# Patient Record
Sex: Male | Born: 1940
Health system: Southern US, Community
[De-identification: ages and names within clinical notes are randomized; demographics above are authoritative.]

## PROBLEM LIST (undated history)

## (undated) DIAGNOSIS — J449 Chronic obstructive pulmonary disease, unspecified: Secondary | ICD-10-CM

## (undated) DIAGNOSIS — I340 Nonrheumatic mitral (valve) insufficiency: Secondary | ICD-10-CM

## (undated) DIAGNOSIS — K8020 Calculus of gallbladder without cholecystitis without obstruction: Secondary | ICD-10-CM

## (undated) DIAGNOSIS — D649 Anemia, unspecified: Secondary | ICD-10-CM

## (undated) DIAGNOSIS — Z9981 Dependence on supplemental oxygen: Secondary | ICD-10-CM

## (undated) DIAGNOSIS — M1990 Unspecified osteoarthritis, unspecified site: Principal | ICD-10-CM

## (undated) DIAGNOSIS — R918 Other nonspecific abnormal finding of lung field: Secondary | ICD-10-CM

## (undated) DIAGNOSIS — Z87891 Personal history of nicotine dependence: Secondary | ICD-10-CM

## (undated) DIAGNOSIS — J189 Pneumonia, unspecified organism: Secondary | ICD-10-CM

## (undated) DIAGNOSIS — E871 Hypo-osmolality and hyponatremia: Secondary | ICD-10-CM

## (undated) DIAGNOSIS — E785 Hyperlipidemia, unspecified: Secondary | ICD-10-CM

## (undated) DIAGNOSIS — Z9841 Cataract extraction status, right eye: Secondary | ICD-10-CM

## (undated) DIAGNOSIS — I5022 Chronic systolic (congestive) heart failure: Secondary | ICD-10-CM

## (undated) DIAGNOSIS — I10 Essential (primary) hypertension: Secondary | ICD-10-CM

## (undated) DIAGNOSIS — F419 Anxiety disorder, unspecified: Secondary | ICD-10-CM

## (undated) DIAGNOSIS — Z789 Other specified health status: Secondary | ICD-10-CM

## (undated) DIAGNOSIS — Z7982 Long term (current) use of aspirin: Secondary | ICD-10-CM

## (undated) DIAGNOSIS — I4719 Other supraventricular tachycardia: Secondary | ICD-10-CM

## (undated) DIAGNOSIS — I509 Heart failure, unspecified: Secondary | ICD-10-CM

## (undated) DIAGNOSIS — I700 Atherosclerosis of aorta: Secondary | ICD-10-CM

## (undated) DIAGNOSIS — I219 Acute myocardial infarction, unspecified: Secondary | ICD-10-CM

## (undated) DIAGNOSIS — Z9842 Cataract extraction status, left eye: Secondary | ICD-10-CM

## (undated) DIAGNOSIS — K429 Umbilical hernia without obstruction or gangrene: Secondary | ICD-10-CM

## (undated) DIAGNOSIS — C20 Malignant neoplasm of rectum: Secondary | ICD-10-CM

## (undated) DIAGNOSIS — I6789 Other cerebrovascular disease: Secondary | ICD-10-CM

## (undated) DIAGNOSIS — I255 Ischemic cardiomyopathy: Secondary | ICD-10-CM

## (undated) DIAGNOSIS — I350 Nonrheumatic aortic (valve) stenosis: Secondary | ICD-10-CM

## (undated) DIAGNOSIS — E876 Hypokalemia: Secondary | ICD-10-CM

## (undated) DIAGNOSIS — R7303 Prediabetes: Secondary | ICD-10-CM

## (undated) DIAGNOSIS — I5020 Unspecified systolic (congestive) heart failure: Secondary | ICD-10-CM

## (undated) DIAGNOSIS — I272 Pulmonary hypertension, unspecified: Secondary | ICD-10-CM

## (undated) DIAGNOSIS — I2510 Atherosclerotic heart disease of native coronary artery without angina pectoris: Secondary | ICD-10-CM

## (undated) DIAGNOSIS — Z77098 Contact with and (suspected) exposure to other hazardous, chiefly nonmedicinal, chemicals: Secondary | ICD-10-CM

## (undated) DIAGNOSIS — C649 Malignant neoplasm of unspecified kidney, except renal pelvis: Secondary | ICD-10-CM

## (undated) DIAGNOSIS — Z933 Colostomy status: Secondary | ICD-10-CM

## (undated) DIAGNOSIS — I517 Cardiomegaly: Secondary | ICD-10-CM

## (undated) HISTORY — DX: Chronic obstructive pulmonary disease, unspecified: J44.9

## (undated) HISTORY — DX: Unspecified systolic (congestive) heart failure: I50.20

## (undated) HISTORY — DX: Malignant neoplasm of rectum: C20

## (undated) HISTORY — DX: Atherosclerotic heart disease of native coronary artery without angina pectoris: I25.10

## (undated) HISTORY — DX: Malignant neoplasm of unspecified kidney, except renal pelvis: C64.9

## (undated) HISTORY — DX: Essential (primary) hypertension: I10

## (undated) HISTORY — PX: HERNIA REPAIR: SHX51

## (undated) HISTORY — DX: Nonrheumatic aortic (valve) stenosis: I35.0

## (undated) HISTORY — DX: Chronic systolic (congestive) heart failure: I50.22

## (undated) HISTORY — DX: Heart failure, unspecified: I50.9

## (undated) HISTORY — PX: CATARACT EXTRACTION, BILATERAL: SHX1313

## (undated) HISTORY — DX: Hyperlipidemia, unspecified: E78.5

## (undated) HISTORY — PX: OTHER SURGICAL HISTORY: SHX169

## (undated) HISTORY — DX: Nonrheumatic mitral (valve) insufficiency: I34.0

## (undated) HISTORY — DX: Ischemic cardiomyopathy: I25.5

## (undated) HISTORY — DX: Unspecified osteoarthritis, unspecified site: M19.90

## (undated) HISTORY — PX: COLOSTOMY: SHX63

## (undated) HISTORY — DX: Contact with and (suspected) exposure to other hazardous, chiefly nonmedicinal, chemicals: Z77.098

---

## 2003-03-31 DIAGNOSIS — C20 Malignant neoplasm of rectum: Secondary | ICD-10-CM

## 2003-04-16 ENCOUNTER — Encounter: Admission: RE | Admit: 2003-04-16 | Discharge: 2003-04-16 | Payer: Self-pay | Admitting: Internal Medicine

## 2003-04-18 ENCOUNTER — Ambulatory Visit (HOSPITAL_COMMUNITY): Admission: RE | Admit: 2003-04-18 | Discharge: 2003-04-18 | Payer: Self-pay | Admitting: Surgery

## 2003-04-23 ENCOUNTER — Ambulatory Visit: Admission: RE | Admit: 2003-04-23 | Discharge: 2003-06-29 | Payer: Self-pay | Admitting: *Deleted

## 2003-04-30 ENCOUNTER — Ambulatory Visit (HOSPITAL_COMMUNITY): Admission: RE | Admit: 2003-04-30 | Discharge: 2003-04-30 | Payer: Self-pay | Admitting: Surgery

## 2003-04-30 ENCOUNTER — Encounter (INDEPENDENT_AMBULATORY_CARE_PROVIDER_SITE_OTHER): Payer: Self-pay | Admitting: Specialist

## 2003-06-29 HISTORY — PX: APPENDECTOMY: SHX54

## 2003-07-20 ENCOUNTER — Ambulatory Visit: Admission: RE | Admit: 2003-07-20 | Discharge: 2003-07-20 | Payer: Self-pay | Admitting: *Deleted

## 2003-07-26 ENCOUNTER — Encounter (INDEPENDENT_AMBULATORY_CARE_PROVIDER_SITE_OTHER): Payer: Self-pay | Admitting: *Deleted

## 2003-07-26 ENCOUNTER — Inpatient Hospital Stay (HOSPITAL_COMMUNITY): Admission: RE | Admit: 2003-07-26 | Discharge: 2003-08-02 | Payer: Self-pay | Admitting: Surgery

## 2003-11-01 ENCOUNTER — Ambulatory Visit: Admission: RE | Admit: 2003-11-01 | Discharge: 2003-11-01 | Payer: Self-pay | Admitting: Oncology

## 2003-11-28 ENCOUNTER — Ambulatory Visit (HOSPITAL_COMMUNITY): Admission: RE | Admit: 2003-11-28 | Discharge: 2003-11-28 | Payer: Self-pay | Admitting: Oncology

## 2003-12-05 ENCOUNTER — Ambulatory Visit (HOSPITAL_BASED_OUTPATIENT_CLINIC_OR_DEPARTMENT_OTHER): Admission: RE | Admit: 2003-12-05 | Discharge: 2003-12-05 | Payer: Self-pay | Admitting: Surgery

## 2004-02-27 ENCOUNTER — Ambulatory Visit (HOSPITAL_COMMUNITY): Admission: RE | Admit: 2004-02-27 | Discharge: 2004-02-27 | Payer: Self-pay | Admitting: Oncology

## 2004-03-04 ENCOUNTER — Ambulatory Visit: Payer: Self-pay | Admitting: Oncology

## 2004-05-27 ENCOUNTER — Ambulatory Visit: Payer: Self-pay | Admitting: Oncology

## 2004-05-29 ENCOUNTER — Ambulatory Visit (HOSPITAL_COMMUNITY): Admission: RE | Admit: 2004-05-29 | Discharge: 2004-05-29 | Payer: Self-pay | Admitting: Oncology

## 2004-09-01 ENCOUNTER — Ambulatory Visit: Payer: Self-pay | Admitting: Oncology

## 2004-09-04 ENCOUNTER — Ambulatory Visit (HOSPITAL_COMMUNITY): Admission: RE | Admit: 2004-09-04 | Discharge: 2004-09-04 | Payer: Self-pay | Admitting: Oncology

## 2004-10-13 ENCOUNTER — Ambulatory Visit (HOSPITAL_COMMUNITY): Admission: RE | Admit: 2004-10-13 | Discharge: 2004-10-13 | Payer: Self-pay | Admitting: Oncology

## 2004-11-28 ENCOUNTER — Ambulatory Visit (HOSPITAL_COMMUNITY): Admission: RE | Admit: 2004-11-28 | Discharge: 2004-11-28 | Payer: Self-pay | Admitting: Oncology

## 2004-12-03 ENCOUNTER — Ambulatory Visit: Payer: Self-pay | Admitting: Oncology

## 2004-12-05 ENCOUNTER — Ambulatory Visit (HOSPITAL_BASED_OUTPATIENT_CLINIC_OR_DEPARTMENT_OTHER): Admission: RE | Admit: 2004-12-05 | Discharge: 2004-12-05 | Payer: Self-pay | Admitting: Urology

## 2004-12-05 ENCOUNTER — Encounter (INDEPENDENT_AMBULATORY_CARE_PROVIDER_SITE_OTHER): Payer: Self-pay | Admitting: Specialist

## 2005-03-10 ENCOUNTER — Ambulatory Visit (HOSPITAL_COMMUNITY): Admission: RE | Admit: 2005-03-10 | Discharge: 2005-03-10 | Payer: Self-pay | Admitting: Oncology

## 2005-03-11 ENCOUNTER — Ambulatory Visit: Payer: Self-pay | Admitting: Oncology

## 2005-03-17 ENCOUNTER — Ambulatory Visit: Payer: Self-pay | Admitting: Family Medicine

## 2005-06-17 ENCOUNTER — Ambulatory Visit: Payer: Self-pay | Admitting: Oncology

## 2005-09-07 ENCOUNTER — Ambulatory Visit: Payer: Self-pay | Admitting: Oncology

## 2005-09-08 LAB — CBC WITH DIFFERENTIAL (CANCER CENTER ONLY)
BASO%: 0.9 % (ref 0.0–2.0)
LYMPH%: 30.7 % (ref 14.0–48.0)
MCV: 88 fL (ref 82–98)
MONO#: 0.6 10*3/uL (ref 0.1–0.9)
Platelets: 252 10*3/uL (ref 145–400)
RDW: 12.4 % (ref 10.5–14.6)
WBC: 6.2 10*3/uL (ref 4.0–10.0)

## 2005-09-08 LAB — COMPREHENSIVE METABOLIC PANEL
ALT: 14 U/L (ref 0–40)
AST: 17 U/L (ref 0–37)
Albumin: 3.8 g/dL (ref 3.5–5.2)
Alkaline Phosphatase: 66 U/L (ref 39–117)
Potassium: 4.5 mEq/L (ref 3.5–5.3)
Sodium: 137 mEq/L (ref 135–145)
Total Bilirubin: 0.6 mg/dL (ref 0.3–1.2)
Total Protein: 6.3 g/dL (ref 6.0–8.3)

## 2005-09-10 ENCOUNTER — Ambulatory Visit (HOSPITAL_COMMUNITY): Admission: RE | Admit: 2005-09-10 | Discharge: 2005-09-10 | Payer: Self-pay | Admitting: Oncology

## 2005-11-26 ENCOUNTER — Ambulatory Visit: Payer: Self-pay | Admitting: Oncology

## 2005-12-01 LAB — CBC WITH DIFFERENTIAL (CANCER CENTER ONLY)
BASO%: 0.5 % (ref 0.0–2.0)
Eosinophils Absolute: 0.2 10*3/uL (ref 0.0–0.5)
LYMPH%: 22.6 % (ref 14.0–48.0)
MCH: 29.7 pg (ref 28.0–33.4)
MCHC: 33 g/dL (ref 32.0–35.9)
MCV: 90 fL (ref 82–98)
MONO%: 5 % (ref 0.0–13.0)
Platelets: 290 10*3/uL (ref 145–400)
RDW: 13.3 % (ref 10.5–14.6)

## 2005-12-01 LAB — CEA: CEA: 1.6 ng/mL (ref 0.0–5.0)

## 2005-12-03 ENCOUNTER — Ambulatory Visit (HOSPITAL_COMMUNITY): Admission: RE | Admit: 2005-12-03 | Discharge: 2005-12-03 | Payer: Self-pay | Admitting: Oncology

## 2006-02-27 ENCOUNTER — Encounter (INDEPENDENT_AMBULATORY_CARE_PROVIDER_SITE_OTHER): Payer: Self-pay | Admitting: Internal Medicine

## 2006-02-27 LAB — CONVERTED CEMR LAB: PSA: 0.64 ng/mL

## 2006-03-18 ENCOUNTER — Ambulatory Visit: Payer: Self-pay | Admitting: Family Medicine

## 2006-03-18 DIAGNOSIS — E785 Hyperlipidemia, unspecified: Secondary | ICD-10-CM | POA: Insufficient documentation

## 2006-03-19 ENCOUNTER — Ambulatory Visit: Payer: Self-pay

## 2006-03-26 ENCOUNTER — Ambulatory Visit: Payer: Self-pay | Admitting: Family Medicine

## 2006-04-26 ENCOUNTER — Ambulatory Visit: Payer: Self-pay | Admitting: Family Medicine

## 2006-04-26 LAB — CONVERTED CEMR LAB
BUN: 10 mg/dL (ref 6–23)
Calcium: 9.2 mg/dL (ref 8.4–10.5)
Direct LDL: 172.5 mg/dL
Potassium: 4.4 meq/L (ref 3.5–5.1)
Sodium: 140 meq/L (ref 135–145)
Triglycerides: 82 mg/dL (ref 0–149)

## 2006-05-31 ENCOUNTER — Ambulatory Visit: Payer: Self-pay | Admitting: Oncology

## 2006-06-01 LAB — COMPREHENSIVE METABOLIC PANEL
ALT: 16 U/L (ref 0–53)
BUN: 11 mg/dL (ref 6–23)
CO2: 30 mEq/L (ref 19–32)
Calcium: 9.5 mg/dL (ref 8.4–10.5)
Chloride: 102 mEq/L (ref 96–112)
Creatinine, Ser: 0.88 mg/dL (ref 0.40–1.50)
Glucose, Bld: 126 mg/dL — ABNORMAL HIGH (ref 70–99)
Total Bilirubin: 0.9 mg/dL (ref 0.3–1.2)

## 2006-06-01 LAB — CBC WITH DIFFERENTIAL (CANCER CENTER ONLY)
BASO%: 0.5 % (ref 0.0–2.0)
HCT: 42 % (ref 38.7–49.9)
LYMPH#: 1.9 10*3/uL (ref 0.9–3.3)
MONO#: 0.5 10*3/uL (ref 0.1–0.9)
NEUT#: 4.6 10*3/uL (ref 1.5–6.5)
NEUT%: 63.7 % (ref 40.0–80.0)
Platelets: 288 10*3/uL (ref 145–400)
RDW: 12.3 % (ref 10.5–14.6)
WBC: 7.2 10*3/uL (ref 4.0–10.0)

## 2006-06-01 LAB — CEA: CEA: 1.1 ng/mL (ref 0.0–5.0)

## 2006-06-03 ENCOUNTER — Ambulatory Visit (HOSPITAL_COMMUNITY): Admission: RE | Admit: 2006-06-03 | Discharge: 2006-06-03 | Payer: Self-pay | Admitting: Oncology

## 2006-11-24 ENCOUNTER — Ambulatory Visit: Payer: Self-pay | Admitting: Oncology

## 2006-11-26 LAB — COMPREHENSIVE METABOLIC PANEL
ALT: 14 U/L (ref 0–53)
AST: 15 U/L (ref 0–37)
Calcium: 9.6 mg/dL (ref 8.4–10.5)
Chloride: 104 mEq/L (ref 96–112)
Creatinine, Ser: 0.83 mg/dL (ref 0.40–1.50)
Sodium: 139 mEq/L (ref 135–145)
Total Protein: 6.6 g/dL (ref 6.0–8.3)

## 2006-11-26 LAB — CBC WITH DIFFERENTIAL (CANCER CENTER ONLY)
BASO#: 0.1 10*3/uL (ref 0.0–0.2)
Eosinophils Absolute: 0.2 10*3/uL (ref 0.0–0.5)
HCT: 41.2 % (ref 38.7–49.9)
HGB: 14 g/dL (ref 13.0–17.1)
LYMPH#: 2.1 10*3/uL (ref 0.9–3.3)
MONO#: 0.5 10*3/uL (ref 0.1–0.9)
NEUT%: 62.9 % (ref 40.0–80.0)
RBC: 4.63 10*6/uL (ref 4.20–5.70)
WBC: 7.6 10*3/uL (ref 4.0–10.0)

## 2006-11-26 LAB — LACTATE DEHYDROGENASE: LDH: 148 U/L (ref 94–250)

## 2006-11-26 LAB — CEA: CEA: 1.2 ng/mL (ref 0.0–5.0)

## 2006-12-01 ENCOUNTER — Ambulatory Visit (HOSPITAL_COMMUNITY): Admission: RE | Admit: 2006-12-01 | Discharge: 2006-12-01 | Payer: Self-pay | Admitting: Oncology

## 2006-12-06 ENCOUNTER — Encounter (INDEPENDENT_AMBULATORY_CARE_PROVIDER_SITE_OTHER): Payer: Self-pay | Admitting: Internal Medicine

## 2007-03-09 ENCOUNTER — Ambulatory Visit: Payer: Self-pay | Admitting: Family Medicine

## 2007-03-10 ENCOUNTER — Encounter: Payer: Self-pay | Admitting: Internal Medicine

## 2007-03-10 DIAGNOSIS — F17200 Nicotine dependence, unspecified, uncomplicated: Secondary | ICD-10-CM

## 2007-06-22 ENCOUNTER — Ambulatory Visit: Payer: Self-pay | Admitting: Oncology

## 2007-06-27 LAB — CBC WITH DIFFERENTIAL (CANCER CENTER ONLY)
BASO#: 0 10*3/uL (ref 0.0–0.2)
Eosinophils Absolute: 0.2 10*3/uL (ref 0.0–0.5)
HGB: 14.2 g/dL (ref 13.0–17.1)
LYMPH%: 28.5 % (ref 14.0–48.0)
MCH: 30.5 pg (ref 28.0–33.4)
MCV: 90 fL (ref 82–98)
MONO%: 6.4 % (ref 0.0–13.0)
RBC: 4.64 10*6/uL (ref 4.20–5.70)

## 2007-06-28 LAB — COMPREHENSIVE METABOLIC PANEL
Albumin: 4.1 g/dL (ref 3.5–5.2)
BUN: 10 mg/dL (ref 6–23)
Calcium: 9.6 mg/dL (ref 8.4–10.5)
Chloride: 101 mEq/L (ref 96–112)
Creatinine, Ser: 0.8 mg/dL (ref 0.40–1.50)
Glucose, Bld: 102 mg/dL — ABNORMAL HIGH (ref 70–99)
Potassium: 4 mEq/L (ref 3.5–5.3)

## 2007-06-30 ENCOUNTER — Ambulatory Visit (HOSPITAL_COMMUNITY): Admission: RE | Admit: 2007-06-30 | Discharge: 2007-06-30 | Payer: Self-pay | Admitting: Oncology

## 2007-07-05 ENCOUNTER — Encounter (INDEPENDENT_AMBULATORY_CARE_PROVIDER_SITE_OTHER): Payer: Self-pay | Admitting: Internal Medicine

## 2008-02-27 ENCOUNTER — Ambulatory Visit: Payer: Self-pay | Admitting: Oncology

## 2008-03-01 ENCOUNTER — Encounter (INDEPENDENT_AMBULATORY_CARE_PROVIDER_SITE_OTHER): Payer: Self-pay | Admitting: Internal Medicine

## 2008-03-01 LAB — CBC WITH DIFFERENTIAL (CANCER CENTER ONLY)
BASO#: 0.1 10*3/uL (ref 0.0–0.2)
Eosinophils Absolute: 0.2 10*3/uL (ref 0.0–0.5)
HCT: 41.4 % (ref 38.7–49.9)
HGB: 14.1 g/dL (ref 13.0–17.1)
LYMPH#: 2.1 10*3/uL (ref 0.9–3.3)
MCH: 30.6 pg (ref 28.0–33.4)
MONO%: 6.1 % (ref 0.0–13.0)
NEUT#: 5.8 10*3/uL (ref 1.5–6.5)
NEUT%: 66.1 % (ref 40.0–80.0)
RBC: 4.62 10*6/uL (ref 4.20–5.70)

## 2008-03-01 LAB — CMP (CANCER CENTER ONLY)
ALT(SGPT): 15 U/L (ref 10–47)
AST: 22 U/L (ref 11–38)
Albumin: 3.8 g/dL (ref 3.3–5.5)
Alkaline Phosphatase: 68 U/L (ref 26–84)
BUN, Bld: 10 mg/dL (ref 7–22)
Calcium: 9 mg/dL (ref 8.0–10.3)
Chloride: 105 mEq/L (ref 98–108)
Creat: 0.9 mg/dl (ref 0.6–1.2)
Potassium: 4 mEq/L (ref 3.3–4.7)

## 2008-03-05 ENCOUNTER — Encounter (INDEPENDENT_AMBULATORY_CARE_PROVIDER_SITE_OTHER): Payer: Self-pay | Admitting: Internal Medicine

## 2008-03-06 ENCOUNTER — Ambulatory Visit: Payer: Self-pay | Admitting: Family Medicine

## 2008-03-06 DIAGNOSIS — I10 Essential (primary) hypertension: Secondary | ICD-10-CM | POA: Insufficient documentation

## 2008-03-19 ENCOUNTER — Ambulatory Visit: Payer: Self-pay | Admitting: Family Medicine

## 2008-04-06 ENCOUNTER — Ambulatory Visit: Payer: Self-pay | Admitting: Family Medicine

## 2008-04-06 DIAGNOSIS — F39 Unspecified mood [affective] disorder: Secondary | ICD-10-CM | POA: Insufficient documentation

## 2008-04-09 ENCOUNTER — Telehealth (INDEPENDENT_AMBULATORY_CARE_PROVIDER_SITE_OTHER): Payer: Self-pay | Admitting: Internal Medicine

## 2008-04-11 ENCOUNTER — Encounter (INDEPENDENT_AMBULATORY_CARE_PROVIDER_SITE_OTHER): Payer: Self-pay | Admitting: Internal Medicine

## 2008-05-10 ENCOUNTER — Ambulatory Visit: Payer: Self-pay | Admitting: Family Medicine

## 2008-06-21 ENCOUNTER — Ambulatory Visit: Payer: Self-pay | Admitting: Family Medicine

## 2008-06-21 LAB — CONVERTED CEMR LAB
Direct LDL: 198.7 mg/dL
Total CHOL/HDL Ratio: 5
Triglycerides: 100 mg/dL (ref 0.0–149.0)
VLDL: 20 mg/dL (ref 0.0–40.0)

## 2008-06-22 LAB — CONVERTED CEMR LAB
Direct LDL: 208.8 mg/dL
HDL: 45.4 mg/dL (ref >39.0)
Total CHOL/HDL Ratio: 5.9
VLDL: 18 mg/dL (ref 0–40)

## 2009-02-27 ENCOUNTER — Ambulatory Visit: Payer: Self-pay | Admitting: Oncology

## 2009-03-01 LAB — CMP (CANCER CENTER ONLY)
ALT(SGPT): 28 U/L (ref 10–47)
Albumin: 3.7 g/dL (ref 3.3–5.5)
Alkaline Phosphatase: 68 U/L (ref 26–84)
BUN, Bld: 15 mg/dL (ref 7–22)
Chloride: 99 mEq/L (ref 98–108)
Creat: 0.7 mg/dl (ref 0.6–1.2)
Glucose, Bld: 90 mg/dL (ref 73–118)

## 2009-03-01 LAB — CBC WITH DIFFERENTIAL (CANCER CENTER ONLY)
BASO#: 0.1 10*3/uL (ref 0.0–0.2)
EOS%: 2.5 % (ref 0.0–7.0)
HGB: 14 g/dL (ref 13.0–17.1)
LYMPH#: 1.9 10*3/uL (ref 0.9–3.3)
MCHC: 34 g/dL (ref 32.0–35.9)
MONO#: 0.5 10*3/uL (ref 0.1–0.9)
NEUT#: 5.5 10*3/uL (ref 1.5–6.5)
RBC: 4.42 10*6/uL (ref 4.20–5.70)
WBC: 8.2 10*3/uL (ref 4.0–10.0)

## 2009-03-02 LAB — CEA: CEA: 0.8 ng/mL (ref 0.0–5.0)

## 2009-03-06 ENCOUNTER — Ambulatory Visit: Payer: Self-pay | Admitting: Family Medicine

## 2009-03-06 ENCOUNTER — Encounter (INDEPENDENT_AMBULATORY_CARE_PROVIDER_SITE_OTHER): Payer: Self-pay | Admitting: Internal Medicine

## 2009-03-13 ENCOUNTER — Telehealth (INDEPENDENT_AMBULATORY_CARE_PROVIDER_SITE_OTHER): Payer: Self-pay | Admitting: Internal Medicine

## 2009-08-15 ENCOUNTER — Ambulatory Visit: Payer: Self-pay | Admitting: Family Medicine

## 2009-11-11 ENCOUNTER — Ambulatory Visit: Payer: Self-pay | Admitting: Family Medicine

## 2009-11-13 LAB — CONVERTED CEMR LAB
ALT: 16 units/L (ref 0–53)
Albumin: 4.2 g/dL (ref 3.5–5.2)
Chloride: 99 meq/L (ref 96–112)
Cholesterol: 238 mg/dL — ABNORMAL HIGH (ref 0–200)
GFR calc non Af Amer: 115.02 mL/min (ref >60)
Glucose, Bld: 85 mg/dL (ref 70–99)
Total Bilirubin: 0.7 mg/dL (ref 0.3–1.2)
Total CHOL/HDL Ratio: 5
Triglycerides: 100 mg/dL (ref 0.0–149.0)
VLDL: 20 mg/dL (ref 0.0–40.0)

## 2009-12-12 ENCOUNTER — Ambulatory Visit: Payer: Self-pay | Admitting: Family Medicine

## 2009-12-13 LAB — CONVERTED CEMR LAB
Albumin: 4.3 g/dL (ref 3.5–5.2)
Direct LDL: 184.5 mg/dL
Total Bilirubin: 0.9 mg/dL (ref 0.3–1.2)
Total Protein: 7 g/dL (ref 6.0–8.3)

## 2010-03-07 ENCOUNTER — Ambulatory Visit: Payer: Self-pay | Admitting: Family Medicine

## 2010-03-07 ENCOUNTER — Ambulatory Visit: Payer: Self-pay | Admitting: Oncology

## 2010-03-07 LAB — CBC WITH DIFFERENTIAL/PLATELET
Basophils Absolute: 0 10*3/uL (ref 0.0–0.1)
EOS%: 4.2 % (ref 0.0–7.0)
Eosinophils Absolute: 0.4 10*3/uL (ref 0.0–0.5)
HCT: 40.1 % (ref 38.4–49.9)
HGB: 13.8 g/dL (ref 13.0–17.1)
MCHC: 34.4 g/dL (ref 32.0–36.0)
MCV: 92.6 fL (ref 79.3–98.0)
NEUT#: 5.5 10*3/uL (ref 1.5–6.5)
NEUT%: 64.3 % (ref 39.0–75.0)
RBC: 4.33 10*6/uL (ref 4.20–5.82)
RDW: 13.7 % (ref 11.0–14.6)

## 2010-03-07 LAB — COMPREHENSIVE METABOLIC PANEL
ALT: 18 U/L (ref 0–53)
BUN: 14 mg/dL (ref 6–23)
Creatinine, Ser: 0.78 mg/dL (ref 0.40–1.50)
Potassium: 4 mEq/L (ref 3.5–5.3)
Total Bilirubin: 0.8 mg/dL (ref 0.3–1.2)
Total Protein: 7 g/dL (ref 6.0–8.3)

## 2010-03-11 ENCOUNTER — Encounter: Payer: Self-pay | Admitting: Family Medicine

## 2010-04-20 ENCOUNTER — Encounter: Payer: Self-pay | Admitting: Oncology

## 2010-04-29 NOTE — Assessment & Plan Note (Signed)
Summary: ROA FOR 3 MONTH FOLLOW-UP/JRR   Vital Signs:  Patient profile:   70 year old male Height:      67 inches Weight:      175.38 pounds BMI:     27.57 Temp:     98.7 degrees F oral Pulse rate:   76 / minute Pulse rhythm:   regular BP sitting:   150 / 90  (left arm) Cuff size:   regular  Vitals Entered By: Linde Gillis CMA Duncan Dull) (November 11, 2009 4:01 PM) CC: 3 month follow up   History of Present Illness: 70 yo male new to me here for follow up BP and HLD.  HTN- restarted his lisinopril 20 mg  last month.   Has felt ok, no HA, blurred vision, SOB or chest pain.  emr reviewed, BP was 140/80 in 04/2008.  Still remains elevated today.   HLD- lipds elevated when checked last march- LDL 198, TH 100, HDL 58.  Tried simvastatin and Crestor, stopped both due to myalgias.  Started Niaspan 500 mg at bedtime last month.  No side effects, had flushing first night but ok since.  No abdominal pain, yellowing of skin, nausea or vomiting.  Current Medications (verified): 1)  Aleve 220 Mg Tabs (Naproxen Sodium) .... Take One By Mouth Daily 2)  Niaspan 500 Mg Tbcr (Niacin (Antihyperlipidemic)) .Marland Kitchen.. 1 Tab By Mouth At Bedtime 3)  Lisinopril 40 Mg Tabs (Lisinopril) .Marland Kitchen.. 1 By Mouth Once Daily  Allergies: 1)  ! Codeine 2)  ! * Statins  Past History:  Past Medical History: Last updated: 03/10/2007 Hyperlipidemia (03/18/2006)  Past Surgical History: Last updated: 03/10/2007 colonos:  colon CA polyps, int hemms 04/13/03 resection of rectum colostomy-- 4/05, appendectomy lower ext dopplers neg --12/07  Social History: Last updated: 04/06/2008 Marital Status: Married Children:  Occupation:   Risk Factors: Exercise: no (03/06/2008)  Risk Factors: Smoking Status: current (03/10/2007) Packs/Day: 1 (03/06/2008) Passive Smoke Exposure: yes (03/06/2008)  Review of Systems      See HPI General:  Denies malaise. Eyes:  Denies blurring. CV:  Denies chest pain or discomfort. Resp:   Denies shortness of breath. GI:  Denies abdominal pain, constipation, diarrhea, nausea, vomiting, and yellowish skin color.  Physical Exam  General:  alert, well-developed, well-nourished, and well-hydrated.   Lungs:  normal respiratory effort, no intercostal retractions, and no accessory muscle use.   Heart:  normal rate, regular rhythm, and no murmur.   Extremities:  no edema Neurologic:  alert & oriented X3 and gait normal.   Psych:  Oriented X3, normally interactive, flat affect, and subdued.     Impression & Recommendations:  Problem # 1:  HYPERTENSION (ICD-401.9) Assessment Unchanged Still not well controlled.  Increase Lisinopril to 40 mg daily, check BMET today.  Follow up in one month. The following medications were removed from the medication list:    Lisinopril 20 Mg Tabs (Lisinopril) .Marland Kitchen... Take one by mouth daily His updated medication list for this problem includes:    Lisinopril 40 Mg Tabs (Lisinopril) .Marland Kitchen... 1 by mouth once daily  Orders: TLB-BMP (Basic Metabolic Panel-BMET) (80048-METABOL) Venipuncture (16109)  Problem # 2:  HYPERLIPIDEMIA (ICD-272.4) Assessment: Unchanged Continue Niaspan at current dose.  Check lipid panel, LFTs today. His updated medication list for this problem includes:    Niaspan 500 Mg Tbcr (Niacin (antihyperlipidemic)) .Marland Kitchen... 1 tab by mouth at bedtime  Orders: TLB-Lipid Panel (80061-LIPID) Venipuncture (60454)  Complete Medication List: 1)  Aleve 220 Mg Tabs (Naproxen sodium) .... Take one  by mouth daily 2)  Niaspan 500 Mg Tbcr (Niacin (antihyperlipidemic)) .Marland Kitchen.. 1 tab by mouth at bedtime 3)  Lisinopril 40 Mg Tabs (Lisinopril) .Marland Kitchen.. 1 by mouth once daily  Other Orders: TLB-Hepatic/Liver Function Pnl (80076-HEPATIC)  Patient Instructions: 1)  Lets increase your dose of LInisinopril to 40 mg daiily. 2)  Follow up in one month please. Prescriptions: LISINOPRIL 40 MG TABS (LISINOPRIL) 1 by mouth once daily  #90 x 3   Entered and  Authorized by:   [name] MD   Signed by:   [name] MD on 11/11/2009   Method used:   Electronically to        [facility]  [address] (retail)       [address]       [place], [place]  [address]       Ph: [phone]       Fax: [phone]   RxID:   [id number]   Current Allergies (reviewed today): ! CODEINE ! * STATINS  Prevention & Chronic Care Immunizations   Influenza vaccine: Fluvax 3+  (03/06/2009)    Tetanus booster: Not documented    Pneumococcal vaccine: Pneumovax  (03/09/2007)    H. zoster vaccine: Not documented  Colorectal Screening   Hemoccult: Not documented    Colonoscopy: Abnormal  (04/13/2003)  Other Screening   PSA: 0.64  (02/27/2006)   Smoking status: current  (03/10/2007)  Lipids   Total Cholesterol: 272  (06/21/2008)   Lipid panel action/deferral: Lipid Panel ordered   LDL: DEL  (03/19/2008)   LDL Direct: 198.7  (06/21/2008)   HDL: 50.50  (06/21/2008)   Triglycerides: 100.0  (06/21/2008)    SGOT (AST): 15  (06/21/2008)   BMP action: Ordered   SGPT (ALT): 14  (06/21/2008)   Alkaline phosphatase: Not documented   Total bilirubin: Not documented  Hypertension   Last Blood Pressure: 150 / 90  (11/11/2009)   Serum creatinine: 1.0  (04/26/2006)   BMP action: Ordered   Serum potassium 4.4  (04/26/2006)  Self-Management Support :    Hypertension self-management support: Not documented    Lipid self-management support: Not documented

## 2010-04-29 NOTE — Assessment & Plan Note (Signed)
Summary: ESTABH FROM [facility] NEEDS BP MED/DLO   Vital Signs:  Patient profile:   70 year old male Height:      67 inches Weight:      179.38 pounds BMI:     28.20 Temp:     98.6 degrees F oral Pulse rate:   84 / minute Pulse rhythm:   regular BP sitting:   190 / 90  (left arm) Cuff size:   regular  Vitals Entered By: Linde Gillis CMA Duncan Dull) (Aug 15, 2009 2:37 PM) CC: establish from Curtis Campos   History of Present Illness: 70 yo male new to me here for BP med refills.  HTN- ran out of Lisinopril 20 mg several weeks ago.  Has felt ok, no HA, blurred vision, SOB or chest pain.  emr reviewed, BP was 140/80 in 04/2008.  Rectal CA- diagnosed in 2004.  Has been doing well, follows with Dr. Park Breed yearly.  HLD- lipds elevated when checked last march- LDL 198, TH 100, HDL 58.  Tried simvastatin and Crestor, stopped both due to myalgias.  Has not been taking anything since.  Current Medications (verified): 1)  Aleve 220 Mg Tabs (Naproxen Sodium) .... Take One By Mouth Daily 2)  Lisinopril 20 Mg Tabs (Lisinopril) .... Take One By Mouth Daily 3)  Niaspan 500 Mg Tbcr (Niacin (Antihyperlipidemic)) .Marland Kitchen.. 1 Tab By Mouth At Bedtime  Allergies: 1)  ! Codeine 2)  ! * Statins  Review of Systems      See HPI General:  Denies malaise. Eyes:  Denies blurring. CV:  Denies chest pain or discomfort. Resp:  Denies shortness of breath.  Physical Exam  General:  alert, well-developed, well-nourished, and well-hydrated.   Mouth:  Oral mucosa and oropharynx without lesions or exudates.  Teeth in good repair. Lungs:    normal respiratory effort, no intercostal retractions, and no accessory muscle use.   Heart:  normal rate, regular rhythm, and no murmur.   Extremities:  no edema either lower leg at 3pm Psych:  Oriented X3, normally interactive, flat affect, and subdued.     Impression & Recommendations:  Problem # 1:  HYPERTENSION (ICD-401.9) Assessment Deteriorated extreme elevation today due to  not having his meds for several weeks. Refilled his Lisinopril but I would like to see him back in 3 months as BP was slightly elevated at last visit as well.  Pt in agreement with plan. His updated medication list for this problem includes:    Lisinopril 20 Mg Tabs (Lisinopril) .Marland Kitchen... Take one by mouth daily  Problem # 2:  HYPERLIPIDEMIA (ICD-272.4) Assessment: Unchanged Discussed importance of decreasing his HDL.  Will start Niaspan.  Pt to follow up in 3 months, repeat lipid panel, BMET, and liver function tests at that time. The following medications were removed from the medication list:    Crestor 20 Mg Tabs (Rosuvastatin calcium) .Marland Kitchen... 1qd for cholesterol His updated medication list for this problem includes:    Niaspan 500 Mg Tbcr (Niacin (antihyperlipidemic)) .Marland Kitchen... 1 tab by mouth at bedtime  Complete Medication List: 1)  Aleve 220 Mg Tabs (Naproxen sodium) .... Take one by mouth daily 2)  Lisinopril 20 Mg Tabs (Lisinopril) .... Take one by mouth daily 3)  Niaspan 500 Mg Tbcr (Niacin (antihyperlipidemic)) .Marland Kitchen.. 1 tab by mouth at bedtime  Patient Instructions: 1)  Good to meet you.  2)  Please make appointment to come see me in 3 months. Prescriptions: NIASPAN 500 MG TBCR (NIACIN (ANTIHYPERLIPIDEMIC)) 1 tab by mouth at bedtime  #  30 x 3   Entered and Authorized by:   [name] MD   Signed by:   [name] MD on 08/15/2009   Method used:   Print then Give to Patient   RxID:   [id number] LISINOPRIL 20 MG TABS (LISINOPRIL) take one by mouth daily  #90 x 6   Entered and Authorized by:   [name] MD   Signed by:   [name] MD on 08/15/2009   Method used:   Print then Give to Patient   RxID:   [id number]   Current Allergies (reviewed today): ! CODEINE ! * STATINS

## 2010-04-29 NOTE — Assessment & Plan Note (Signed)
Summary: ONE MTH FOLLOW UP / LFW   Vital Signs:  Patient profile:   70 year old male Height:      67 inches Weight:      174 pounds BMI:     27.35 Temp:     98.5 degrees F oral Pulse rate:   68 / minute Pulse rhythm:   regular BP sitting:   140 / 90  (left arm) Cuff size:   regular  Vitals Entered By: Linde Gillis CMA Duncan Dull) (December 12, 2009 12:43 PM) CC: one month follow up   History of Present Illness: 70 yo male here for follow up BP and HLD.  HTN- Increased his Lisinopril to 40 mg daily last month.   Has felt ok, no HA, blurred vision, SOB or chest pain.  elevated today but rushed to get here and has not yet taken his medications.   HLD- Lipids remain elevated (LDL 180).  Increased his Niaspan to 1000 mg mg at bedtime last month.   No abdominal pain, yellowing of skin, nausea or vomiting.  Has had some issues with flushing since we increased the dose, is taking it with ASA. Absolutely cannot tolerate statins.  Current Medications (verified): 1)  Aleve 220 Mg Tabs (Naproxen Sodium) .... Take One By Mouth Daily 2)  Niaspan 500 Mg Cr-Tabs (Niacin (Antihyperlipidemic)) .... Take Two Tablets By Mouth At Bedtime 3)  Lisinopril 40 Mg Tabs (Lisinopril) .Marland Kitchen.. 1 By Mouth Once Daily  Allergies: 1)  ! Codeine 2)  ! * Statins  Past History:  Past Medical History: Last updated: 03/10/2007 Hyperlipidemia (03/18/2006)  Past Surgical History: Last updated: 03/10/2007 colonos:  colon CA polyps, int hemms 04/13/03 resection of rectum colostomy-- 4/05, appendectomy lower ext dopplers neg --12/07  Social History: Last updated: 04/06/2008 Marital Status: Married Children:  Occupation:   Risk Factors: Exercise: no (03/06/2008)  Risk Factors: Smoking Status: current (03/10/2007) Packs/Day: 1 (03/06/2008) Passive Smoke Exposure: yes (03/06/2008)  Review of Systems      See HPI General:  Denies malaise. Eyes:  Denies blurring. CV:  Denies chest pain or  discomfort. Resp:  Denies shortness of breath.  Physical Exam  General:  alert, well-developed, well-nourished, and well-hydrated.   Lungs:  normal respiratory effort, no intercostal retractions, and no accessory muscle use.   Heart:  normal rate, regular rhythm, and no murmur.   Extremities:  no edema Neurologic:  alert & oriented X3 and gait normal.   Psych:  Oriented X3, normally interactive, flat affect, and subdued.     Impression & Recommendations:  Problem # 1:  HYPERTENSION (ICD-401.9) Assessment Unchanged Hopefully improved on increased dose of Lisinopril.  Advised taking his medication when he got home today. His updated medication list for this problem includes:    Lisinopril 40 Mg Tabs (Lisinopril) .Marland Kitchen... 1 by mouth once daily  Problem # 2:  HYPERLIPIDEMIA (ICD-272.4) Assessment: Unchanged recheck fasting lipid and hepatic panel today. His updated medication list for this problem includes:    Niaspan 500 Mg Cr-tabs (Niacin (antihyperlipidemic)) .Marland Kitchen... Take two tablets by mouth at bedtime  Complete Medication List: 1)  Aleve 220 Mg Tabs (Naproxen sodium) .... Take one by mouth daily 2)  Niaspan 500 Mg Cr-tabs (Niacin (antihyperlipidemic)) .... Take two tablets by mouth at bedtime 3)  Lisinopril 40 Mg Tabs (Lisinopril) .Marland Kitchen.. 1 by mouth once daily  Other Orders: Venipuncture (16109) TLB-Lipid Panel (80061-LIPID) TLB-Hepatic/Liver Function Pnl (80076-HEPATIC)  Current Allergies (reviewed today): ! CODEINE ! * STATINS

## 2010-04-29 NOTE — Assessment & Plan Note (Signed)
Summary: FLU SHOT/CLE  Nurse Visit   Allergies: 1)  ! Codeine 2)  ! * Statins  Orders Added: 1)  Flu Vaccine 59yrs + MEDICARE PATIENTS [Q2039] 2)  Administration Flu vaccine - MCR [G0008]   Flu Vaccine Consent Questions     Do you have a history of severe allergic reactions to this vaccine? no    Any prior history of allergic reactions to egg and/or gelatin? no    Do you have a sensitivity to the preservative Thimersol? no    Do you have a past history of Guillan-Barre Syndrome? no    Do you currently have an acute febrile illness? no    Have you ever had a severe reaction to latex? no    Vaccine information given and explained to patient? yes    Are you currently pregnant? no    Lot Number:[id number]   Exp Date:09/27/2010   Site Given  Left Deltoid IM

## 2010-05-01 NOTE — Letter (Signed)
Summary: [facility]  [facility]   Imported By: Lanelle Bal 03/25/2010 10:44:04  _____________________________________________________________________  External Attachment:    Type:   Image     Comment:   External Document  Appended Document: [facility] stable adenocarcinoma of rectum diagnosed in 2004

## 2010-08-15 NOTE — Op Note (Signed)
[name], Curtis Campos                        ACCOUNT NO.:  [id number]   MEDICAL RECORD NO.:  [id number]                   PATIENT TYPE:  OUT   LOCATION:  XRAY                                 FACILITY:  [facility]   PHYSICIAN:  Sandria Bales. Ezzard Standing, M.D.               DATE OF BIRTH:  1940/11/23   DATE OF PROCEDURE:  12/05/2003  DATE OF DISCHARGE:  11/28/2003                                 OPERATIVE REPORT   PREOPERATIVE DIAGNOSIS:  Port-A-Cath for treatment of colorectal cancer.   POSTOPERATIVE DIAGNOSIS:  Port-A-Cath for treatment of colorectal cancer.   PROCEDURE:  Removal of left subclavian Port-A-Cath.   SURGEON:  Sandria Bales. Ezzard Standing, M.D.   ANESTHESIA:  9 mL of 1% Xylocaine.   COMPLICATIONS:  None.   INDICATIONS:  [name] is a 70 year old white male who has completed  treatment for colorectal carcinoma.  She has done well without any evidence  of recurrence.  He now comes for removal of his Port-A-Cath.  He was laid  supine in the bed.  His left chest was painted with Betadine solution and  sterilely draped.  The Port-A-Cath area infiltrated with 0.25% Marcaine.  Incision through the old incision was made, removing the Port-A-Cath without  difficulty.  He wanted this so I gave it to him.  I closed the wound with 5-  0 Vicryl, painted with tincture of Benzoin and Steri-Strips.  He tolerated  the procedure well.                                               Sandria Bales. Ezzard Standing, M.D.    DHN/MEDQ  D:  12/05/2003  T:  12/05/2003  Job:  [id number]   cc:   Donia Guiles, M.D.  [address] [place]  [place] [address]  Fax: [phone]

## 2010-08-15 NOTE — Op Note (Signed)
Curtis Campos, Curtis Campos                        ACCOUNT NO.:  [id number]   MEDICAL RECORD NO.:  [id number]                   PATIENT TYPE:  AMB   LOCATION:  DAY                                  FACILITY:  [facility]   PHYSICIAN:  Sandria Bales. Ezzard Standing, M.D.               DATE OF BIRTH:  1940-08-27   DATE OF PROCEDURE:  04/30/2003  DATE OF DISCHARGE:                                 OPERATIVE REPORT   PREOPERATIVE DIAGNOSIS:  History of rectal cancer, anticipating preoperative  chemo-radiation.   POSTOPERATIVE DIAGNOSIS:  History of rectal cancer, anticipating  preoperative chemo-radiation.   PROCEDURE:  Left subclavian Bard X-Port placement.   SURGEON:  Sandria Bales. Ezzard Standing, M.D.   ANESTHESIA:  MAC with 20 mL of 1% lidocaine.   COMPLICATIONS:  None.   INDICATION FOR PROCEDURE:  [name] is a 70 year old white male who has  been found to have a rectal carcinoma approximately 6 cm from the anal  verge.  The patient, we planned preoperative chemo-radiation prior to  surgery.  A discussion about the indications and potential complications  carried out with the patient, potential complications included but not  limited to bleeding, infection, pneumothorax, and thrombosis around the  catheter.   The patient now presents to the operating room for Port-A-Cath placement.   He was given 1 g of Ancef at the initiation of the procedure.  His upper  chest was prepped with Betadine and sterilely draped and a roll placed  behind his back and his arms tucked.  He was placed in about a 15-20 degree  Trendelenburg position, the left subclavian vein accessed with the 16-gauge  needle, and a guidewire threaded into the subclavian vein.  The position was  checked with fluoroscopy.   Then I developed a pocket in the upper inner aspect of the left chest,  flushed the reservoir with dilute heparin which was 10 units/mL, and sewed  this in place with a 3-0 Vicryl suture.  I then passed the Silastic tubing  from the central venous port site into the subclavian vein and under  fluoroscopy placed the catheter tip at the junction of the right atrium and  superior vena cava.  The entire unit was flushed with heparinized solution,  which was diluted to 10 units/mL.  I then passed the Silastic tubing to the  reservoir using attachment device.  First I flushed the unit with dilute  heparin, then used 5 mL of the heparin flush which was 100 units/mL to flush  the entire unit.   Then the position of both the reservoir and the entire length of the  catheter was examined, and it appeared to be in good position.   The subcutaneous tissues were then closed with 3-0 Vicryl suture, the skin  closed with a 5-0 Vicryl suture, painted with tincture of Benzoin and steri-  stripped.  The patient tolerated the procedure well, was transported to the  recovery room in good condition.                                               Sandria Bales. Ezzard Standing, M.D.    DHN/MEDQ  D:  04/30/2003  T:  04/30/2003  Job:  [id number]   cc:   [place] Constable, P.A.   Elmer Sow. Dorna Bloom, M.D.  [address] - [facility]  [place]  [place] [address]  Fax: [phone]   Drue Second, MD  Fax: [phone]

## 2010-08-15 NOTE — Op Note (Signed)
Curtis Campos, Curtis Campos              ACCOUNT NO.:  [id number]   MEDICAL RECORD NO.:  [id number]          PATIENT TYPE:  AMB   LOCATION:  [facility]                         FACILITY:  [facility]   PHYSICIAN:  Bertram Millard. Dahlstedt, M.D.DATE OF BIRTH:  Sep 05, 1940   DATE OF PROCEDURE:  12/05/2004  DATE OF DISCHARGE:                                 OPERATIVE REPORT   PREOPERATIVE DIAGNOSIS:  Lower urinary tract symptoms.   POSTOPERATIVE DIAGNOSIS:  Lower urinary tract symptoms.   PROCEDURE:  1.  Cystourethroscopy.  2.  TUIP.   SURGEON:  Bertram Millard. Retta Diones, M.D.   ASSISTANT:  Glade Nurse, M.D.   ANESTHESIA:  General endotracheal.   SPECIMENS:  Prostatic chips.   HISTORY OF PRESENT ILLNESS:  This is a very pleasant 70 year old gentleman  who is seen in consultation for lower urinary tract symptoms. He has a  history of pelvic radiation as well as adjuvant chemotherapy prior to an APR  in April of 2005. Subsequent to his radiation surgeries, he has had  bothersome lower urinary tract symptoms. The patient had been tried on  medical therapy with no alleviation of his symptoms. He underwent  cystourethroscopy which demonstrated a very short fibrotic prostatic  urethra. After failing medical therapy and discussing his surgical options  including TUNA, TURP, and TUIP, he has elected to proceed with TUIP which is  indicated.   DESCRIPTION OF PROCEDURE:  The patient was identified by his wrist bracelet  and brought to room 2 where he received preoperative antibiotics and was  administered general anesthesia. He was prepped and draped in the usual  sterile fashion. Next with a 12 and 70 degree lens, we inserted a 22 French  cystoscopic sheath and the patient underwent pancystourethroscopy which  demonstrated normal penile membranous urethra. At the level of his prostatic  urethra, we noted a very short prostatic urethra without median or lateral  lobe hypertrophy. His prostate was somewhat  fibrotic in nature. Upon  entering his bladder, his bilateral ureteral orifices were noted in their  normal anatomic position effluxing clear urine bilaterally. His bladder was  mildly trabeculated. The patient then underwent pancystoscopy which  demonstrated no other mucosal abnormalities or foreign bodies. We then  removed the cystoscope and attempted to insert a 26 French resectoscope  sheath with the blind obturator. We were not able to do and we removed the  resectoscope sheath and sequentially dilated the patient's anterior and  posterior urethra with male Sissy Hoff sounds from 20 to 30 [place]  sequentially. This was done without difficulty. We were then able to easily  insert the aforementioned resectoscope sheath. We then removed the obturator  and placed a 12 degree lens with a loop resectoscope. We then resected his  prostate from the level of his bladder neck to his verumontanum at the 6  o'clock position down to and through the capsule at the bladder neck.  Hemostasis was obtained throughout. The bladder neck opened nicely once we  were through the level of the bladder neck. We then inspected his trigone,  his ureteral orifices were not involved in the resection. We  then removed  the resectoscope and irrigated the bladder removing all prostatic chips. We  reinserted the resectoscope and camera. The bladder was found free of  foreign bodies, hemostasis was noted to be excellent. The resectoscope was  then removed and a 22 French Foley catheter was inserted at the level of the  urinary bladder. Clear urine output was obtained. 10 mL of sterile water was  inserted in the valve stem, the Foley was placed to straight drain. The  patient was then reversed from his anesthesia. Please note Bertram Millard.  Dahlstedt, M.D. was present and participated in all aspects of this case.     ______________________________  Glade Nurse, MD      Bertram Millard. Dahlstedt, M.D.  Electronically  Signed    MT/MEDQ  D:  12/05/2004  T:  12/05/2004  Job:  [id number]

## 2010-08-15 NOTE — Discharge Summary (Signed)
NAMEELON, EOFF                        ACCOUNT NO.:  [id number]   MEDICAL RECORD NO.:  [id number]                   PATIENT TYPE:  INP   LOCATION:  0344                                 FACILITY:  [facility]   PHYSICIAN:  Sandria Bales. Ezzard Standing, M.D.               DATE OF BIRTH:  1940/12/30   DATE OF ADMISSION:  07/26/2003  DATE OF DISCHARGE:  08/02/2003                                 DISCHARGE SUMMARY   DISCHARGE DIAGNOSES:  1. C2, N0 carcinoma of the rectum with a 1 cm tumor, 0-4 nodes involved.  2. Hypertension.   OPERATION PERFORMED:  Patient had a rectal ultrasound, abdominal perineal  resection, and appendectomy on July 26, 2003.   HISTORY OF PRESENT ILLNESS:  Mr. Farra is a 70 year old white male who  sees Arizona Constable, a Publishing rights manager, at [facility], was  identified as having carcinoma of the rectum.  This tumor was approximately  6-9 cm in the anal verge.  Had a rectal ultrasound which appeared to be a  UT3 tumor and underwent neoadjuvant chemotherapy radiation by Dr. Jackelyn Knife  and Dr. Patrica Duel.   He has completed this treatment and done well from the therapy.  Has had a  remarkable response, at least by palpation.  This tumor is maybe 20% of the  size that I thought the tumor was originally.   Discussion was carried out with attempts for a low anterior resection versus  abdominal perineal resection, which depends on the distance from the anal  verge and whether we can get below this tumor safely.   Patient completed a bowel prep.  Presented to the hospital on the 28th of  April, 2005, where he underwent a rectal ultrasound that showed this tumor  had decreased from 1.4 cm in thickness to 0.7 cm in thickness.  It looked  like he had been down-staged to a UT2 tumor; however, at the time of  surgery, the tumor now was maybe 3-4 cm from the anus.  I tried to do a low  anterior resection but actually cut the tumor right at the edge of the  distal  margin and felt uncomfortable leaving this, so we did a completion  abdominal perineal resection.  Also did an appendectomy at the same time.   Postoperatively, he did well.  The NG tube was removed on the first  postoperative day.  His hemoglobin was 11, hematocrit 33, white blood count  9500.  His sodium was 139, potassium 3.9, chloride 110.   By the fourth postoperative day, he was doing well.  He was moving around.  He still had nothing coming out of his bowel.  He had kind of a  postoperative ileus.  I did remove one of his two rectal drains that day.  At that time, his hemoglobin was 10, and his white blood count was down to  6500.  He then started passing  gas.  We started rapidly advancing his diet.  He is now 7 days postop.  He is on a regular diet.  His colostomy is  working.  His abdominal incision has healed.  His rectum looks good.  I  removed the second drain.   His final pathology reveals a focal residual adenocarcinoma of 1 cm  involving a 1.5 cm area of scar.  The tumor went to a thickness of the  muscularis.  It was a T2.  He had four nodes identified, which were all  negative, so he is a T2, N0 rectal carcinoma.  He is now ready for  discharge.  He will be given Vicodin for pain and resume his home medicines  for his blood pressure.  He can shower.  He will see me back in six days for  suture removal.                                               Sandria Bales. Ezzard Standing, M.D.    DHN/MEDQ  D:  08/02/2003  T:  08/02/2003  Job:  [id number]   cc:   Drue Second, MD  Fax: [phone]   Elmer Sow. Dorna Bloom, M.D.  [address] - [facility]  [place]  [place] [address]  Fax: [phone]   [place] Constable, FNP

## 2010-08-15 NOTE — Op Note (Signed)
Curtis Campos, Curtis Campos                        ACCOUNT NO.:  [id number]   MEDICAL RECORD NO.:  [id number]                   PATIENT TYPE:  INP   LOCATION:  0002                                 FACILITY:  [facility]   PHYSICIAN:  Sandria Bales. Ezzard Standing, M.D.               DATE OF BIRTH:  1940/08/27   DATE OF PROCEDURE:  07/26/2003  DATE OF DISCHARGE:                                 OPERATIVE REPORT   PREOPERATIVE DIAGNOSIS:  Rectal carcinoma status post neoadjuvant  chemoradiation therapy.   POSTOPERATIVE DIAGNOSIS:  Rectal carcinoma with the bottom of the carcinoma  at about 5 cm from the anal verge, the top at about 8 to 9 cm from the anal  verge with ultrasound thickness of T2 tumor.   PROCEDURE:  Attempted low anterior resection converted to an abdominal  perineal resection and appendectomy.   SURGEON:  Sandria Bales. Ezzard Standing, M.D.   FIRST ASSISTANT:  Timothy E. Earlene Plater, M.D.   ANESTHESIA:  General endotracheal anesthesia.   ESTIMATED BLOOD LOSS:  300 mL.   DRAINS:  Two Blake drains in the pelvis.   INDICATIONS FOR PROCEDURE:  The patient is a 70 year old white male who was  identified as having a rectal carcinoma in January of 2005.  He underwent  neoadjuvant chemoradiation therapy supervised by Dr. Jill Alexanders Workup and Dr.  Drue Second and has done well from this now. He is approximately four to  five weeks out from his last treatment and comes for attempted low anterior  resection.   In my previous staging, I had measured this tumor approximately 6 cm from  the anal verge with an ultrasound depth of a T3 which is about 1.3 cm in  diameter.  I discussed with him the indications of the procedure,  complications of the surgery, potential complications including but not  limited to bleeding, infection, nerve injury. I also talked with him for a  long time that there is probably a 50/50 probability that we could do his  low anterior resection versus an abdominal perineal resection.   DESCRIPTION OF PROCEDURE:  The patient comes to the operating room and  underwent a general anesthetic.  He was in the lithotomy position. He had  completed a bowel prep at home and was given a gram of cefotetan at the  initiation of the procedure.  His abdomen and perineum were shaved and  prepped with Betadine solution and sterilely draped.  He was put in the  lithotomy position and a Foley catheter was placed.  He had PAS stockings in  place.  I draped out his abdomen and perineum.   Before I did this, I did do a sigmoidoscopy of his rectum and again it  looked like this was about 5 to 6 cm from the anal verge.  It actually may  have been a little bit closer than what I had imagined before.  Whether this  is a reaction  to the radiation therapy or whether this is the true margin is  unclear.  Certainly, by palpation and direct examination this tumor has  shown remarkable resolution, probably only 20 to 30% at most of what I could  palpate and see before.   I then used the rectal ultrasound with the 10 megahertz probe and identified  changes in the rectal wall at about 67 cm.  These measured a maximum  thickness of 7.6 mm.  There was about half the thickness of what the tumor  was on the pre-treatment.   I then prepped out his abdomen and perineum and sterilely draped him.  I  went through a lower abdominal incision and entered the abdominal cavity.   First, I palpated the liver which was unremarkable.  I palpated the stomach  with the NG tube in good position. I ran the small bowel and he had no  Meckel's or other abnormalities of the small bowel.  His colon was palpated  and was unremarkable.   I then turned my attention to the distal sigmoid colon and rectum.  First, I  opened the peritoneum on both sides down to the pelvis.  I identified both  ureters which were spared and kept lateral to the dissection. I divided the  sigmoid colon at the mid sigmoid colon and carried this down  to the branches  off the superior rectal arteries.  I then got into the sacral promontory and  dissected this posteriorly, worked on both lateral side walls with a  harmonic scalpel and then I anteriorly took the colon off the prostate  anteriorly.   There was noted to be a lot of fibrous tissue certainly consistent with his  radiation therapy and chemotherapy.  There was no gross tumor noted at  either the dissection or outside the pelvis.  There was no palpable  adenopathy.  I had palpated the liver which was unremarkable.   I got down as low as I thought I could get which was below the prostate  freeing up both lateral walls.  I went all the way down to the coccyx  posteriorly.  Dr. Earlene Plater went below.  We tried to measure this tumor and at  the tip of where we thought the body of the tumor was I placed a stitch.   I then used the TA55 reticulating stapler to get this down below the sutured  line.  His pelvis was very narrow which made getting this into the proper  position difficult, but I finally did get a good positioning of it.   At this point I used the TA55 across the colon at the suture marker where we  had marked where we thought the body of the tumor was and divided the colon.   I then opened the colon.  It was clear that he had significant regression of  his tumor with what looked like an ulcer scar about 1 to 1.5 cm with some  fibrous scarring around it.  We then sent this specimen to Dr. Luisa Hart to  see there were viable tumor cells.  Unfortunately, he did say by biopsy  there viable tumor cells. Again, this ulcer came right at the very distal  end of my resection.  I also spoke to Dr. Dorna Bloom by phone, but I did not think  this was an adequate distal margin in lieu of his initial colon cancer and  that he would be best served with proceeding with an abdominal perineal  resection.  I then went down below, closed his rectum, cut out an ellipse around his  anus of about a cm  and tracked following the colon up above the levators  resecting the distal staple line.   Again, I could feel no mass from the lower pelvis. We irrigated out two  liters of saline.  I then closed the levators with interrupted 2-0 Vicryl  sutures. I brought in [place] drains through the lateral wall on both sides  and placed these down in the pelvis.  I did place some Surgicel in the  prostate bed and down the presacral area.   At the same time, Dr. Earlene Plater closed the pelvic floor.  Again, we had spared  the ureters during the dissection.  When I had finished closing the  subcutaneous tissues with 2-0 Vicryl sutures, I then closed the skin with 2-  0 nylon, sewed the drains in with 2-0 nylon and came back above.   I then washed out the abdomen, removed the sponges and sponge count was  correct.  I freshened up the end of the sigmoid colon trying to get some of  the laxity out of it.  I brought the omentum down to the preperitoneal space  and closed the perineum with running #1 PDS sutures.   I then turned my attention to mobilizing the sigmoid colon and maturing it.  I then cut a piece of skin and made a wound in the left lower quadrant that  had been previously marked before by the ostomy nurse. I brought the sigmoid  colon out through this wound, bringing it through the rectus abdominis  muscles.  It was sewn internally with 2-0 silk sutures to the anterior  abdominal wall and then I closed the midline fascia with two running #1 PDS  sutures and irrigated the wound and closed the skin with skin staples.   I turned my attention to the ostomy which was matured while the patient was  asleep with interrupted 3-0 Vicryl sutures.   The patient tolerated the procedure well.  Estimated blood loss was 300 mL.  Again, he has two drains out of the rectum and he has the colostomy. All his  wounds were otherwise closed.  He was transferred to the recovery room in  good condition.                                                Sandria Bales. Ezzard Standing, M.D.    DHN/MEDQ  D:  07/26/2003  T:  07/26/2003  Job:  [id number]   cc:   Legrand Rams, M.D.   Wilhemina Bonito. Marina Goodell, M.D. [facility]   Drue Second, MD  Fax: [phone]   Elmer Sow. Dorna Bloom, M.D.  [address] - [facility]  [place]  [place] [address]  Fax: [phone]

## 2010-08-15 NOTE — Op Note (Signed)
Curtis Campos, Curtis Campos                        ACCOUNT NO.:  [id number]   MEDICAL RECORD NO.:  [id number]                   PATIENT TYPE:  OUT   LOCATION:  OMED                                 FACILITY:  [facility]   PHYSICIAN:  Sandria Bales. Ezzard Standing, M.D.               DATE OF BIRTH:  1940/10/25   DATE OF PROCEDURE:  04/18/2003  DATE OF DISCHARGE:                                 OPERATIVE REPORT   PREOPERATIVE DIAGNOSIS:  Rectal carcinoma.   POSTOPERATIVE DIAGNOSIS:  Right anterior rectal carcinoma, approximately 6-9  cm from the anal verge, which appears to be an early UT3 lesion by  ultrasound with a thickness about 1.3 cm.   PROCEDURE:  Rigid sigmoidoscopy to 15 cm and use of rectal ultrasound.   SURGEON:  Sandria Bales. Ezzard Standing, M.D.   ANESTHESIA:  No anesthesia.   INDICATION FOR PROCEDURE:  [name] is a patient __________ at [facility], is also seeing Dr. Yancey Flemings, and underwent a colonoscopy and was  found to have a rectal carcinoma.  Biopsy of this showed adenocarcinoma.  He  now comes for further staging of this.  He has had a CT scan which showed  some thickening of the rectal wall.   With the patient in the left lateral decubitus position, first I passed a  rigid sigmoidoscope.  I thought I measured the bottom of his tumor at about  6 cm from the anal verge, the top of the tumor about 9 cm from the anal  verge, and this is sort of based on the right anterior rectal wall.  I then  used the 10 megahertz rectal ultrasound and passed this into the rectum, and  this tumor sits actually opposite the prostate gland.  By ultrasound it is a  little bit harder to get the exact bottom dimension than the top dimension,  but again I am guessing the bottom is about 5-6 cm, the top probably about 9  cm.  Certainly it goes well into the muscularis propria.  There does appear  to be breakthrough into the surrounding fat, and this is at about the 10  o'clock position relative to the prostate  being at the 12 o'clock position  in the rectum.   There were several photos taken.  The patient tolerated the procedure well.  We will discuss his case with medical oncology and radiation oncology to  determine an appropriate course.                                               Sandria Bales. Ezzard Standing, M.D.    DHN/MEDQ  D:  04/18/2003  T:  04/19/2003  Job:  [id number]

## 2010-12-29 ENCOUNTER — Other Ambulatory Visit: Payer: Self-pay | Admitting: Family Medicine

## 2011-01-02 ENCOUNTER — Encounter: Payer: Self-pay | Admitting: Family Medicine

## 2011-01-02 LAB — HM COLONOSCOPY

## 2011-01-06 ENCOUNTER — Ambulatory Visit (INDEPENDENT_AMBULATORY_CARE_PROVIDER_SITE_OTHER): Payer: BC Managed Care – PPO | Admitting: Family Medicine

## 2011-01-06 ENCOUNTER — Other Ambulatory Visit: Payer: Self-pay | Admitting: *Deleted

## 2011-01-06 ENCOUNTER — Encounter: Payer: Self-pay | Admitting: Family Medicine

## 2011-01-06 VITALS — BP 150/90 | HR 78 | Temp 98.7°F | Ht 66.0 in | Wt 179.2 lb

## 2011-01-06 DIAGNOSIS — E785 Hyperlipidemia, unspecified: Secondary | ICD-10-CM

## 2011-01-06 DIAGNOSIS — C20 Malignant neoplasm of rectum: Secondary | ICD-10-CM

## 2011-01-06 DIAGNOSIS — Z23 Encounter for immunization: Secondary | ICD-10-CM

## 2011-01-06 DIAGNOSIS — I10 Essential (primary) hypertension: Secondary | ICD-10-CM

## 2011-01-06 LAB — LDL CHOLESTEROL, DIRECT: Direct LDL: 189.4 mg/dL

## 2011-01-06 LAB — BASIC METABOLIC PANEL
CO2: 29 mEq/L (ref 19–32)
Calcium: 9.1 mg/dL (ref 8.4–10.5)
Creatinine, Ser: 0.7 mg/dL (ref 0.4–1.5)
GFR: 118.42 mL/min (ref >60.00)
Sodium: 137 mEq/L (ref 135–145)

## 2011-01-06 LAB — HEPATIC FUNCTION PANEL
ALT: 15 U/L (ref 0–53)
AST: 19 U/L (ref 0–37)
Albumin: 4.4 g/dL (ref 3.5–5.2)
Total Bilirubin: 0.9 mg/dL (ref 0.3–1.2)
Total Protein: 7.5 g/dL (ref 6.0–8.3)

## 2011-01-06 LAB — LIPID PANEL
Cholesterol: 258 mg/dL — ABNORMAL HIGH (ref 0–200)
HDL: 51.8 mg/dL (ref >39.00)
Triglycerides: 84 mg/dL (ref 0.0–149.0)

## 2011-01-06 MED ORDER — NIACIN ER (ANTIHYPERLIPIDEMIC) 1000 MG PO TBCR: 1000.0000 mg | EXTENDED_RELEASE_TABLET | Freq: Every day | ORAL | Status: DC

## 2011-01-06 MED ORDER — LISINOPRIL 40 MG PO TABS: 40.0000 mg | ORAL_TABLET | Freq: Every day | ORAL | Status: DC

## 2011-01-06 NOTE — Progress Notes (Signed)
70 yo male here for follow up HTN and HLD.  HTN-On Lisinopril to 40 mg daily.  No HA, CP, LE edema or SOB. Ran out of his medication, has not taken it in several days.  HLD- Lipids remain elevated (LDL 180).  Increased his Niaspan to 1000 mg mg at bedtime last year (has not been seen in over a year).    No abdominal pain, yellowing of skin, nausea or vomiting.  Has had some issues with flushing since we increased the dose, is taking it with ASA. Absolutely cannot tolerate statins.  Adenocarcinoma of rectum- diagnosed in 2004- stable. Followed now yearly by Dr. Park Breed.  Next appt in December.   Lab Results  Component Value Date   CHOL 243* 12/12/2009   CHOL 238* 11/11/2009   CHOL 272* 06/21/2008   Lab Results  Component Value Date   HDL 47.10 12/12/2009   HDL 40.98 11/11/2009   HDL 11.91 06/21/2008    Lab Results  Component Value Date   TRIG 129.0 12/12/2009   TRIG 100.0 11/11/2009   TRIG 100.0 06/21/2008     Lab Results  Component Value Date   ALT 18 03/07/2010   AST 21 03/07/2010   ALKPHOS 54 03/07/2010   BILITOT 0.8 03/07/2010   Patient Active Problem List  Diagnoses  . MALIGNANT NEOPLASM OF RECTUM  . HYPERLIPIDEMIA  . NICOTINE ADDICTION  . DEPRESSION, SITUATIONAL  . HYPERTENSION   Past Medical History  Diagnosis Date  . Hyperlipidemia    Past Surgical History  Procedure Date  . Appendectomy 06/2003  . Resection of rectum    History  Substance Use Topics  . Smoking status: Not on file  . Smokeless tobacco: Not on file  . Alcohol Use:    No family history on file. Allergies  Allergen Reactions  . Codeine     REACTION: nausea  . Statins     REACTION: maylgias   Current Outpatient Prescriptions on File Prior to Visit  Medication Sig Dispense Refill  . lisinopril (PRINIVIL,ZESTRIL) 20 MG tablet TAKE TWO TABLETS BY MOUTH EVERY DAY  60 tablet  0  . naproxen sodium (ANAPROX) 220 MG tablet Take 220 mg by mouth daily.        . niacin (NIASPAN) 500 MG CR tablet  Take two tablets by mouth at bedtime        The PMH, PSH, Social History, Family History, Medications, and allergies have been reviewed in [facility], and have been updated if relevant.    Review of Systems       See HPI General:  Denies malaise. Eyes:  Denies blurring. CV:  Denies chest pain or discomfort. Resp:  Denies shortness of breath.  Physical Exam BP 150/90  Pulse 78  Temp(Src) 98.7 F (37.1 C) (Oral)  Ht 5\' 6"  (1.676 m)  Wt 179 lb 4 oz (81.307 kg)  BMI 28.93 kg/m2  General:  alert, well-developed, well-nourished, and well-hydrated.   Lungs:  normal respiratory effort, no intercostal retractions, and no accessory muscle use.   Heart:  normal rate, regular rhythm, and no murmur.   Extremities:  no edema Neurologic:  alert & oriented X3 and gait normal.   Psych:  Oriented X3, normally interactive, flat affect, and subdued.    Assessment and Plan:  1. HYPERTENSION   Deteriorated.  Restart Lisinopril 40 mg daily, rx sent to pharmacy.  Recheck BMET today. lisinopril (PRINIVIL,ZESTRIL) 40 MG tablet, Basic Metabolic Panel (BMET)  2. HYPERLIPIDEMIA   Continue Niaspan, recheck labs  today. niacin (NIASPAN) 1000 MG CR tablet, Lipid Profile, Hepatic function panel  3. Malignant neoplasm of rectum   Stable.  Followed by Dr. Park Breed.

## 2011-01-06 NOTE — Telephone Encounter (Signed)
Received a faxed refill request from [facility] needing clarification on the Niacin Rx.  Is patient suppose to be on 1,000mg  once at bedtime or twice at bedtime?  Please advise.

## 2011-01-06 NOTE — Telephone Encounter (Signed)
Once at bedtime. He was on on two 500 mg tablets at night we increased it to one 1000 mg tablet at night.

## 2011-01-06 NOTE — Telephone Encounter (Signed)
Rx sent to pharmacy, left message on voicemail to clarify the change.

## 2011-01-06 NOTE — Patient Instructions (Signed)
Good to see you. We will call with your blood test in a day or two.

## 2011-01-07 ENCOUNTER — Encounter: Payer: Self-pay | Admitting: *Deleted

## 2011-03-05 ENCOUNTER — Other Ambulatory Visit: Payer: Self-pay | Admitting: Oncology

## 2011-03-05 ENCOUNTER — Other Ambulatory Visit (HOSPITAL_BASED_OUTPATIENT_CLINIC_OR_DEPARTMENT_OTHER): Payer: BC Managed Care – PPO | Admitting: Lab

## 2011-03-05 DIAGNOSIS — Z85048 Personal history of other malignant neoplasm of rectum, rectosigmoid junction, and anus: Secondary | ICD-10-CM

## 2011-03-05 LAB — CBC WITH DIFFERENTIAL/PLATELET
BASO%: 0.6 % (ref 0.0–2.0)
EOS%: 2.4 % (ref 0.0–7.0)
MCH: 30.3 pg (ref 27.2–33.4)
MCHC: 32.8 g/dL (ref 32.0–36.0)
MONO#: 0.7 10*3/uL (ref 0.1–0.9)
RBC: 4.7 10*6/uL (ref 4.20–5.82)
RDW: 14.6 % (ref 11.0–14.6)
WBC: 7.8 10*3/uL (ref 4.0–10.3)
lymph#: 2 10*3/uL (ref 0.9–3.3)

## 2011-03-05 LAB — COMPREHENSIVE METABOLIC PANEL
Albumin: 4.1 g/dL (ref 3.5–5.2)
Alkaline Phosphatase: 76 U/L (ref 39–117)
CO2: 30 mEq/L (ref 19–32)
Chloride: 99 mEq/L (ref 96–112)
Creatinine, Ser: 0.7 mg/dL (ref 0.50–1.35)
Sodium: 135 mEq/L (ref 135–145)
Total Bilirubin: 0.4 mg/dL (ref 0.3–1.2)

## 2011-03-05 LAB — CEA: CEA: 1.3 ng/mL (ref 0.0–5.0)

## 2011-03-06 ENCOUNTER — Telehealth: Payer: Self-pay | Admitting: *Deleted

## 2011-03-06 NOTE — Telephone Encounter (Signed)
Message copied by Cooper Render on Fri Mar 06, 2011 11:33 AM ------      Message from: Victorino December      Created: Fri Mar 06, 2011  6:17 AM       Call patient:labs look good

## 2011-03-06 NOTE — Telephone Encounter (Signed)
S/w [name], pt's wife notified Labs look good. [name] verbalized understanding. Confirmed they would be at next MD visit.

## 2011-03-12 ENCOUNTER — Telehealth: Payer: Self-pay | Admitting: *Deleted

## 2011-03-12 ENCOUNTER — Ambulatory Visit (HOSPITAL_BASED_OUTPATIENT_CLINIC_OR_DEPARTMENT_OTHER): Payer: BC Managed Care – PPO | Admitting: Oncology

## 2011-03-12 ENCOUNTER — Encounter: Payer: Self-pay | Admitting: Oncology

## 2011-03-12 DIAGNOSIS — M1990 Unspecified osteoarthritis, unspecified site: Secondary | ICD-10-CM

## 2011-03-12 DIAGNOSIS — C20 Malignant neoplasm of rectum: Secondary | ICD-10-CM

## 2011-03-12 DIAGNOSIS — Z85038 Personal history of other malignant neoplasm of large intestine: Secondary | ICD-10-CM

## 2011-03-12 HISTORY — DX: Unspecified osteoarthritis, unspecified site: M19.90

## 2011-03-12 NOTE — Telephone Encounter (Signed)
mailed out patient's 2013 appointment on 03-12-2011

## 2011-03-12 NOTE — Patient Instructions (Addendum)
Colonoscopy A colonoscopy is an exam to evaluate your entire colon. In this exam, your colon is cleansed. A long fiberoptic tube is inserted through your rectum and into your colon. The fiberoptic scope (endoscope) is a long bundle of enclosed and very flexible fibers. These fibers transmit light to the area examined and send images from that area to your caregiver. Discomfort is usually minimal. You may be given a drug to help you sleep (sedative) during or prior to the procedure. This exam helps to detect lumps (tumors), polyps, inflammation, and areas of bleeding. Your caregiver may also take a small piece of tissue (biopsy) that will be examined under a microscope. LET YOUR CAREGIVER KNOW ABOUT:   Allergies to food or medicine.   Medicines taken, including vitamins, herbs, eyedrops, over-the-counter medicines, and creams.   Use of steroids (by mouth or creams).   Previous problems with anesthetics or numbing medicines.   History of bleeding problems or blood clots.   Previous surgery.   Other health problems, including diabetes and kidney problems.   Possibility of pregnancy, if this applies.  BEFORE THE PROCEDURE   A clear liquid diet may be required for 2 days before the exam.   Ask your caregiver about changing or stopping your regular medications.   Liquid injections (enemas) or laxatives may be required.   A large amount of electrolyte solution may be given to you to drink over a short period of time. This solution is used to clean out your colon.   You should be present 60 minutes prior to your procedure or as directed by your caregiver.  AFTER THE PROCEDURE   If you received a sedative or pain relieving medication, you will need to arrange for someone to drive you home.   Occasionally, there is a little blood passed with the first bowel movement. Do not be concerned.  FINDING OUT THE RESULTS OF YOUR TEST Not all test results are available during your visit. If your test  results are not back during the visit, make an appointment with your caregiver to find out the results. Do not assume everything is normal if you have not heard from your caregiver or the medical facility. It is important for you to follow up on all of your test results. HOME CARE INSTRUCTIONS   It is not unusual to pass moderate amounts of gas and experience mild abdominal cramping following the procedure. This is due to air being used to inflate your colon during the exam. Walking or a warm pack on your belly (abdomen) may help.   You may resume all normal meals and activities after sedatives and medicines have worn off.   Only take over-the-counter or prescription medicines for pain, discomfort, or fever as directed by your caregiver. Do not use aspirin or blood thinners if a biopsy was taken. Consult your caregiver for medicine usage if biopsies were taken.  SEEK IMMEDIATE MEDICAL CARE IF:   You have a fever.   You pass large blood clots or fill a toilet with blood following the procedure. This may also occur 10 to 14 days following the procedure. This is more likely if a biopsy was taken.   You develop abdominal pain that keeps getting worse and cannot be relieved with medicine.  Document Released: 03/13/2000 Document Revised: 11/26/2010 Document Reviewed: 10/27/2007 [employer] Patient Information 2012 [employer]. 

## 2011-03-12 NOTE — Progress Notes (Signed)
OFFICE PROGRESS NOTE  CC  Curtis Mannan, MD, MD [address] [place] [address], [place] [address]  DIAGNOSIS: 70 year old gentleman with adenocarcinoma of the rectum diagnosed December 2004  PRIOR THERAPY:  Patient initially presented with a large rectal mass measuring 3.5 x 3.0 cm about 5.0 cm from the anal verge. He received preop chemoradiation with chemotherapy consisting of infusional 5-FU. He completed all of his therapy in February 2005 and then underwent an APR with colostomy.  CURRENT THERAPY: Observation  INTERVAL HISTORY: Curtis Campos 70 y.o. male returns for followup visit today. Overall he is doing well he continues to work full-time. He is denying any fevers chills night sweats headaches shortness of breath chest pains palpitations he does have some arthritic type of pain in his right knee is applying a salve on it and does tell me that it feels better. Has no myalgias or any other arthralgias. He has not noticed any blood in the colostomy bag. No diarrhea or constipation. Remainder of the 10 point review of systems is negative.  MEDICAL HISTORY: Past Medical History  Diagnosis Date  . Hyperlipidemia   . Arthritis 03/12/2011    ALLERGIES:  is allergic to codeine and statins.  MEDICATIONS:  Current Outpatient Prescriptions  Medication Sig Dispense Refill  . aspirin 81 MG tablet Take 81 mg by mouth daily. Takes at night       . lisinopril (PRINIVIL,ZESTRIL) 40 MG tablet Take 1 tablet (40 mg total) by mouth daily.  90 tablet  3  . naproxen sodium (ANAPROX) 220 MG tablet Take 220 mg by mouth daily.        . niacin (NIASPAN) 1000 MG CR tablet Take 1 tablet (1,000 mg total) by mouth at bedtime.  90 tablet  3    SURGICAL HISTORY:  Past Surgical History  Procedure Date  . Appendectomy 06/2003  . Resection of rectum     REVIEW OF SYSTEMS:  Pertinent items are noted in HPI.   PHYSICAL EXAMINATION: General appearance: alert, cooperative and appears  stated age Head: Normocephalic, without obvious abnormality, atraumatic Neck: no adenopathy, no carotid bruit, no JVD, supple, symmetrical, trachea midline and thyroid not enlarged, symmetric, no tenderness/mass/nodules Lymph nodes: Cervical, supraclavicular, and axillary nodes normal. Resp: clear to auscultation bilaterally and normal percussion bilaterally Back: symmetric, no curvature. ROM normal. No CVA tenderness. Cardio: regular rate and rhythm, S1, S2 normal, no murmur, click, rub or gallop and normal apical impulse GI: soft, non-tender; bowel sounds normal; no masses,  no organomegaly Extremities: extremities normal, atraumatic, no cyanosis or edema Neurologic: Alert and oriented X 3, normal strength and tone. Normal symmetric reflexes. Normal coordination and gait  ECOG PERFORMANCE STATUS: 1 - Symptomatic but completely ambulatory  Blood pressure 170/95, pulse 96, temperature 98 F (36.7 C), temperature source Oral, height 5\' 7"  (1.702 m), weight 181 lb (82.101 kg).  LABORATORY DATA: Lab Results  Component Value Date   WBC 7.8 03/05/2011   HGB 14.2 03/05/2011   HCT 43.4 03/05/2011   MCV 92.2 03/05/2011   PLT 300 03/05/2011      Chemistry      Component Value Date/Time   NA 135 03/05/2011 1604   NA 142 03/01/2009 1331   K 4.3 03/05/2011 1604   K 4.5 03/01/2009 1331   CL 99 03/05/2011 1604   CL 99 03/01/2009 1331   CO2 30 03/05/2011 1604   CO2 30 03/01/2009 1331   BUN 9 03/05/2011 1604   BUN 15  03/01/2009 1331   CREATININE 0.70 03/05/2011 1604   CREATININE 0.7 03/01/2009 1331      Component Value Date/Time   CALCIUM 9.6 03/05/2011 1604   CALCIUM 9.2 03/01/2009 1331   ALKPHOS 76 03/05/2011 1604   ALKPHOS 68 03/01/2009 1331   AST 17 03/05/2011 1604   AST 26 03/01/2009 1331   ALT 13 03/05/2011 1604   BILITOT 0.4 03/05/2011 1604   BILITOT 0.60 03/01/2009 1331       RADIOGRAPHIC STUDIES:  No results found.  ASSESSMENT: 70 year old gentleman with history of adenocarcinoma of the  rectum originally diagnosed December 2004 presenting with a 3.5 cm mass 5 cm from the anal verge. He received preop chemoradiation therapy with infusional 5-FU. He completed all of this therapy and for pre-2005 and then underwent an APR with colostomy. He has done well he is without any evidence of recurrent disease.   PLAN: Patient will continue to be seen on your yearly basis. I did suggest seeing him on a as needed basis but at this time he is still very comfortable in seeing me once a year.   All questions were answered. The patient knows to call the clinic with any problems, questions or concerns. We can certainly see the patient much sooner if necessary.  I spent 20 minutes counseling the patient face to face. The total time spent in the appointment was 30 minutes.    Drue Second, MD Medical/Oncology [facility] [phone] (beeper) [phone] (Office)  03/12/2011, 3:30 PM

## 2011-04-22 ENCOUNTER — Encounter: Payer: Self-pay | Admitting: Family Medicine

## 2011-04-22 ENCOUNTER — Ambulatory Visit (INDEPENDENT_AMBULATORY_CARE_PROVIDER_SITE_OTHER): Payer: BC Managed Care – PPO | Admitting: Family Medicine

## 2011-04-22 DIAGNOSIS — Z720 Tobacco use: Secondary | ICD-10-CM

## 2011-04-22 DIAGNOSIS — F17200 Nicotine dependence, unspecified, uncomplicated: Secondary | ICD-10-CM

## 2011-04-22 DIAGNOSIS — J209 Acute bronchitis, unspecified: Secondary | ICD-10-CM

## 2011-04-22 MED ORDER — DOXYCYCLINE HYCLATE 100 MG PO TABS: 100.0000 mg | ORAL_TABLET | Freq: Two times a day (BID) | ORAL | Status: AC

## 2011-04-22 NOTE — Progress Notes (Signed)
  Patient Name: Curtis Campos Date of Birth: 05-Mar-1941 Age: 71 y.o. Medical Record Number: [id number] Gender: male Date of Encounter: 04/22/2011  History of Present Illness:  Curtis Campos is a 71 y.o. very pleasant male patient who presents with the following:  Deep cough and sore all in stomache. Tickling in throat. Started on Monday and started with a runny nose. Smokes about 1 pack aday for 50+. No real SOB, but he is having a deep continuous cough.  Dramatic wheezing. No significant shortness of breath. Some sputum production. No fever, myalgias. No arthralgia.  H/o rectal cancer - stable for 8 years  Past Medical History, Surgical History, Social History, Family History, Problem List, Medications, and Allergies have been reviewed and updated if relevant.  Review of Systems: ROS: GEN: Acute illness details above GI: Tolerating PO intake GU: maintaining adequate hydration and urination Pulm: No SOB Interactive and getting along well at home.  Otherwise, ROS is as per the HPI.  Physical Examination: Filed Vitals:   04/22/11 1139  BP: 130/84  Pulse: 80  Temp: 99.4 F (37.4 C)  TempSrc: Oral  Height: 5\' 6"  (1.676 m)  Weight: 177 lb 1.9 oz (80.341 kg)  SpO2: 94%    Body mass index is 28.59 kg/(m^2).   GEN: A and O x 3. WDWN. NAD.    ENT: Nose clear, ext NML.  No LAD.  No JVD.  TM's clear. Oropharynx clear.  PULM: Clear throughout bilaterally without any significant wheezing. No crackles CV: RRR, no M/G/R, No rubs, No JVD.   EXT: warm and well-perfused, No c/c/e. PSYCH: Pleasant and conversant.   Assessment and Plan: 1. Bronchitis, acute  doxycycline (VIBRA-TABS) 100 MG tablet  2. Tobacco abuse  doxycycline (VIBRA-TABS) 100 MG tablet    Bronchitis in the setting the patient was a 50-pack-year smoker with likely some COPD and a history of rectal cancer.

## 2012-02-02 ENCOUNTER — Ambulatory Visit (INDEPENDENT_AMBULATORY_CARE_PROVIDER_SITE_OTHER): Payer: BC Managed Care – PPO

## 2012-02-02 DIAGNOSIS — Z23 Encounter for immunization: Secondary | ICD-10-CM

## 2012-02-03 ENCOUNTER — Other Ambulatory Visit: Payer: BC Managed Care – PPO

## 2012-02-04 ENCOUNTER — Other Ambulatory Visit (INDEPENDENT_AMBULATORY_CARE_PROVIDER_SITE_OTHER): Payer: BC Managed Care – PPO

## 2012-02-04 ENCOUNTER — Other Ambulatory Visit: Payer: Self-pay | Admitting: Family Medicine

## 2012-02-04 DIAGNOSIS — Z125 Encounter for screening for malignant neoplasm of prostate: Secondary | ICD-10-CM

## 2012-02-04 DIAGNOSIS — E785 Hyperlipidemia, unspecified: Secondary | ICD-10-CM

## 2012-02-04 DIAGNOSIS — I10 Essential (primary) hypertension: Secondary | ICD-10-CM

## 2012-02-04 LAB — COMPREHENSIVE METABOLIC PANEL
AST: 19 U/L (ref 0–37)
Alkaline Phosphatase: 68 U/L (ref 39–117)
BUN: 11 mg/dL (ref 6–23)
Creatinine, Ser: 0.8 mg/dL (ref 0.4–1.5)

## 2012-02-04 LAB — PSA: PSA: 0.78 ng/mL (ref 0.10–4.00)

## 2012-02-04 LAB — LIPID PANEL
Cholesterol: 283 mg/dL — ABNORMAL HIGH (ref 0–200)
HDL: 58.3 mg/dL (ref >39.00)
VLDL: 18 mg/dL (ref 0.0–40.0)

## 2012-02-04 NOTE — Telephone Encounter (Signed)
Pt hasnt been seen by you in over a year, requesting a 3 month supply.

## 2012-02-04 NOTE — Telephone Encounter (Signed)
Ok to refill.  Needs to be seen for further refills.

## 2012-02-08 ENCOUNTER — Ambulatory Visit (INDEPENDENT_AMBULATORY_CARE_PROVIDER_SITE_OTHER): Payer: BC Managed Care – PPO | Admitting: Family Medicine

## 2012-02-08 ENCOUNTER — Encounter: Payer: Self-pay | Admitting: Family Medicine

## 2012-02-08 VITALS — BP 142/82 | HR 88 | Temp 98.3°F | Wt 176.0 lb

## 2012-02-08 DIAGNOSIS — I10 Essential (primary) hypertension: Secondary | ICD-10-CM

## 2012-02-08 DIAGNOSIS — E785 Hyperlipidemia, unspecified: Secondary | ICD-10-CM

## 2012-02-08 MED ORDER — EZETIMIBE 10 MG PO TABS: 10.0000 mg | ORAL_TABLET | Freq: Every day | ORAL | Status: DC

## 2012-02-08 NOTE — Patient Instructions (Addendum)
Good to see you. We are adding Zetia 10 mg daily to your Niaspan. Please come back in 4-8 weeks to recheck your cholesterol and liver function.

## 2012-02-08 NOTE — Progress Notes (Signed)
71 yo male here for follow up HTN and HLD.  HTN-On Lisinopril to 40 mg daily.  No HA, CP, LE edema or SOB. Lab Results  Component Value Date   CREATININE 0.8 02/04/2012     HLD- Lipids remain extremely elevated.  He is a smoker and does not want to quit.  On Niaspan to 1000 mg mg at bedtime  (has not been seen in over a year).   States he absolutely cannot tolerate statins- tried multiple statins with Billie Bean.     Lab Results  Component Value Date   CHOL 283* 02/04/2012   HDL 58.30 02/04/2012   LDLDIRECT 198.0 02/04/2012   TRIG 90.0 02/04/2012   CHOLHDL 5 02/04/2012    Patient Active Problem List  Diagnosis  . MALIGNANT NEOPLASM OF RECTUM  . HYPERLIPIDEMIA  . NICOTINE ADDICTION  . DEPRESSION, SITUATIONAL  . HYPERTENSION  . Arthritis   Past Medical History  Diagnosis Date  . Hyperlipidemia   . Arthritis 03/12/2011   Past Surgical History  Procedure Date  . Appendectomy 06/2003  . Resection of rectum    History  Substance Use Topics  . Smoking status: Never Smoker   . Smokeless tobacco: Not on file  . Alcohol Use: Not on file   No family history on file. Allergies  Allergen Reactions  . Codeine     REACTION: nausea  . Statins     REACTION: maylgias   Current Outpatient Prescriptions on File Prior to Visit  Medication Sig Dispense Refill  . aspirin 81 MG tablet Take 81 mg by mouth daily. Takes at night       . lisinopril (PRINIVIL,ZESTRIL) 40 MG tablet TAKE ONE TABLET BY MOUTH EVERY DAY  90 tablet  0  . naproxen sodium (ANAPROX) 220 MG tablet Take 220 mg by mouth daily.        . niacin (NIASPAN) 1000 MG CR tablet Take 1 tablet (1,000 mg total) by mouth at bedtime.  90 tablet  3   The PMH, PSH, Social History, Family History, Medications, and allergies have been reviewed in [facility], and have been updated if relevant.    Review of Systems       See HPI General:  Denies malaise. Eyes:  Denies blurring. CV:  Denies chest pain or discomfort. Resp:  Denies  shortness of breath.  Physical Exam BP 142/82  Pulse 88  Temp 98.3 F (36.8 C) (Oral)  Wt 176 lb (79.833 kg)  General:  alert, well-developed, well-nourished, and well-hydrated.   Lungs:  normal respiratory effort, no intercostal retractions, and no accessory muscle use.   Heart:  normal rate, regular rhythm, and no murmur.   Extremities:  no edema Neurologic:  alert & oriented X3 and gait normal.   Psych:  Oriented X3, normally interactive, flat affect, and subdued.    Assessment and Plan:  1. HYPERTENSION  Stable. Continue lisinopril at current dose.  2. HYPERLIPIDEMIA  Remains poorly controlled and again will not try statins.  Samples and rx sent for Zetia, continue Niaspan.  Follow up labs in 4-8 weeks.

## 2012-02-09 ENCOUNTER — Other Ambulatory Visit: Payer: Self-pay | Admitting: Family Medicine

## 2012-03-11 ENCOUNTER — Encounter: Payer: Self-pay | Admitting: Oncology

## 2012-03-11 ENCOUNTER — Other Ambulatory Visit (HOSPITAL_BASED_OUTPATIENT_CLINIC_OR_DEPARTMENT_OTHER): Payer: BC Managed Care – PPO | Admitting: Lab

## 2012-03-11 ENCOUNTER — Ambulatory Visit (HOSPITAL_BASED_OUTPATIENT_CLINIC_OR_DEPARTMENT_OTHER): Payer: BC Managed Care – PPO | Admitting: Oncology

## 2012-03-11 ENCOUNTER — Telehealth: Payer: Self-pay | Admitting: *Deleted

## 2012-03-11 VITALS — BP 185/99 | HR 103 | Temp 98.4°F | Resp 20 | Ht 66.0 in | Wt 177.9 lb

## 2012-03-11 DIAGNOSIS — Z85048 Personal history of other malignant neoplasm of rectum, rectosigmoid junction, and anus: Secondary | ICD-10-CM | POA: Diagnosis not present

## 2012-03-11 DIAGNOSIS — C20 Malignant neoplasm of rectum: Secondary | ICD-10-CM

## 2012-03-11 DIAGNOSIS — M1990 Unspecified osteoarthritis, unspecified site: Secondary | ICD-10-CM

## 2012-03-11 LAB — COMPREHENSIVE METABOLIC PANEL (CC13)
ALT: 17 U/L (ref 0–55)
Albumin: 4.1 g/dL (ref 3.5–5.0)
CO2: 27 mEq/L (ref 22–29)
Calcium: 9.6 mg/dL (ref 8.4–10.4)
Chloride: 101 mEq/L (ref 98–107)
Creatinine: 0.7 mg/dL (ref 0.7–1.3)
Potassium: 4.3 mEq/L (ref 3.5–5.1)
Total Protein: 7.4 g/dL (ref 6.4–8.3)

## 2012-03-11 LAB — CBC WITH DIFFERENTIAL/PLATELET
Eosinophils Absolute: 0.2 10*3/uL (ref 0.0–0.5)
HCT: 42.4 % (ref 38.4–49.9)
LYMPH%: 23.1 % (ref 14.0–49.0)
MONO#: 0.9 10*3/uL (ref 0.1–0.9)
NEUT#: 5.8 10*3/uL (ref 1.5–6.5)
Platelets: 279 10*3/uL (ref 140–400)
RBC: 4.57 10*6/uL (ref 4.20–5.82)
WBC: 9 10*3/uL (ref 4.0–10.3)

## 2012-03-11 NOTE — Telephone Encounter (Signed)
Gave patient appointment for one year for 2014

## 2012-03-11 NOTE — Patient Instructions (Addendum)
Doing well  We will see you back in 1 year 

## 2012-03-12 LAB — CEA: CEA: 1.3 ng/mL (ref 0.0–5.0)

## 2012-03-16 ENCOUNTER — Other Ambulatory Visit: Payer: BC Managed Care – PPO

## 2012-03-19 DIAGNOSIS — M25559 Pain in unspecified hip: Secondary | ICD-10-CM | POA: Diagnosis not present

## 2012-04-03 ENCOUNTER — Emergency Department: Payer: Self-pay | Admitting: Emergency Medicine

## 2012-04-03 DIAGNOSIS — J189 Pneumonia, unspecified organism: Secondary | ICD-10-CM | POA: Diagnosis not present

## 2012-04-03 DIAGNOSIS — R42 Dizziness and giddiness: Secondary | ICD-10-CM | POA: Diagnosis not present

## 2012-04-03 LAB — URINALYSIS, COMPLETE
Blood: NEGATIVE
Hyaline Cast: 3
Leukocyte Esterase: NEGATIVE
Nitrite: NEGATIVE
Ph: 6 (ref 4.5–8.0)
Protein: NEGATIVE
RBC,UR: 1 /HPF (ref 0–5)
WBC UR: 5 /HPF (ref 0–5)

## 2012-04-03 LAB — BASIC METABOLIC PANEL
Anion Gap: 8 (ref 7–16)
BUN: 11 mg/dL (ref 7–18)
Calcium, Total: 9.1 mg/dL (ref 8.5–10.1)
Creatinine: 0.81 mg/dL (ref 0.60–1.30)
EGFR (Non-African Amer.): >60
Glucose: 101 mg/dL — ABNORMAL HIGH (ref 65–99)
Osmolality: 268 (ref 275–301)
Potassium: 4.6 mmol/L (ref 3.5–5.1)
Sodium: 134 mmol/L — ABNORMAL LOW (ref 136–145)

## 2012-04-03 LAB — CBC
HCT: 41.4 % (ref 40.0–52.0)
HGB: 13.4 g/dL (ref 13.0–18.0)
MCH: 29.8 pg (ref 26.0–34.0)
MCHC: 32.3 g/dL (ref 32.0–36.0)
Platelet: 289 10*3/uL (ref 150–440)
RBC: 4.48 10*6/uL (ref 4.40–5.90)
RDW: 14.1 % (ref 11.5–14.5)
WBC: 12.7 10*3/uL — ABNORMAL HIGH (ref 3.8–10.6)

## 2012-04-03 LAB — CK TOTAL AND CKMB (NOT AT ARMC)
CK, Total: 124 U/L (ref 35–232)
CK, Total: 115 U/L (ref 35–232)
CK-MB: 3.4 ng/mL (ref 0.5–3.6)
CK-MB: 3.4 ng/mL (ref 0.5–3.6)

## 2012-04-03 LAB — TROPONIN I
Troponin-I: <0.02 ng/mL
Troponin-I: <0.02 ng/mL

## 2012-04-03 NOTE — Progress Notes (Signed)
OFFICE PROGRESS NOTE  CC  [name], MD [address] [place] [address] [place] [address]  DIAGNOSIS: 72 year old gentleman with adenocarcinoma of the rectum diagnosed December 2004  PRIOR THERAPY:  Patient initially presented with a large rectal mass measuring 3.5 x 3.0 cm about 5.0 cm from the anal verge. He received preop chemoradiation with chemotherapy consisting of infusional 5-FU. He completed all of his therapy in February 2005 and then underwent an APR with colostomy.  CURRENT THERAPY: Observation  INTERVAL HISTORY: KEAUN SCHNABEL 72 y.o. male returns for followup visit today. Overall he is doing well he continues to work full-time. He is denying any fevers chills night sweats headaches shortness of breath chest pains palpitations he does have some arthritic type of pain in his right knee is applying a salve on it and does tell me that it feels better. Has no myalgias or any other arthralgias. He has not noticed any blood in the colostomy bag. No diarrhea or constipation. Remainder of the 10 point review of systems is negative.  MEDICAL HISTORY: Past Medical History  Diagnosis Date  . Hyperlipidemia   . Arthritis 03/12/2011    ALLERGIES:  is allergic to codeine and statins.  MEDICATIONS:  Current Outpatient Prescriptions  Medication Sig Dispense Refill  . aspirin 81 MG tablet Take 81 mg by mouth daily. Takes at night       . ezetimibe (ZETIA) 10 MG tablet Take 1 tablet (10 mg total) by mouth daily.  30 tablet  3  . lisinopril (PRINIVIL,ZESTRIL) 40 MG tablet TAKE ONE TABLET BY MOUTH EVERY DAY  90 tablet  0  . naproxen sodium (ANAPROX) 220 MG tablet Take 220 mg by mouth daily.        Marland Kitchen NIASPAN 1000 MG CR tablet TAKE ONE TABLET BY MOUTH EVERY DAY AT BEDTIME  90 tablet  1    SURGICAL HISTORY:  Past Surgical History  Procedure Date  . Appendectomy 06/2003  . Resection of rectum     REVIEW OF SYSTEMS:  Pertinent items are noted in HPI.    PHYSICAL EXAMINATION: General appearance: alert, cooperative and appears stated age Head: Normocephalic, without obvious abnormality, atraumatic Neck: no adenopathy, no carotid bruit, no JVD, supple, symmetrical, trachea midline and thyroid not enlarged, symmetric, no tenderness/mass/nodules Lymph nodes: Cervical, supraclavicular, and axillary nodes normal. Resp: clear to auscultation bilaterally and normal percussion bilaterally Back: symmetric, no curvature. ROM normal. No CVA tenderness. Cardio: regular rate and rhythm, S1, S2 normal, no murmur, click, rub or gallop and normal apical impulse GI: soft, non-tender; bowel sounds normal; no masses,  no organomegaly Extremities: extremities normal, atraumatic, no cyanosis or edema Neurologic: Alert and oriented X 3, normal strength and tone. Normal symmetric reflexes. Normal coordination and gait  ECOG PERFORMANCE STATUS: 72 - Symptomatic but completely ambulatory  Blood pressure 185/99, pulse 103, temperature 98.4 F (36.9 C), temperature source Oral, resp. rate 20, height 5\' 6"  (1.676 m), weight 177 lb 14.4 oz (80.695 kg).  LABORATORY DATA: Lab Results  Component Value Date   WBC 9.0 03/11/2012   HGB 14.2 03/11/2012   HCT 42.4 03/11/2012   MCV 92.8 03/11/2012   PLT 279 03/11/2012      Chemistry      Component Value Date/Time   NA 139 03/11/2012 1404   NA 135 02/04/2012 0813   NA 142 03/01/2009 1331   K 4.3 03/11/2012 1404   K 4.8 02/04/2012 0813   K 4.5 03/01/2009 1331  CL 101 03/11/2012 1404   CL 99 02/04/2012 0813   CL 99 03/01/2009 1331   CO2 27 03/11/2012 1404   CO2 28 02/04/2012 0813   CO2 30 03/01/2009 1331   BUN 14.0 03/11/2012 1404   BUN 11 02/04/2012 0813   BUN 15 03/01/2009 1331   CREATININE 0.7 03/11/2012 1404   CREATININE 0.8 02/04/2012 0813   CREATININE 0.7 03/01/2009 1331      Component Value Date/Time   CALCIUM 9.6 03/11/2012 1404   CALCIUM 9.4 02/04/2012 0813   CALCIUM 9.2 03/01/2009 1331   ALKPHOS 85  03/11/2012 1404   ALKPHOS 68 02/04/2012 0813   ALKPHOS 68 03/01/2009 1331   AST 17 03/11/2012 1404   AST 19 02/04/2012 0813   AST 26 03/01/2009 1331   ALT 17 03/11/2012 1404   ALT 17 02/04/2012 0813   BILITOT 0.59 03/11/2012 1404   BILITOT 1.1 02/04/2012 0813   BILITOT 0.60 03/01/2009 1331       RADIOGRAPHIC STUDIES:  No results found.  ASSESSMENT: 72 year old gentleman with history of adenocarcinoma of the rectum originally diagnosed December 2004 presenting with a 3.5 cm mass 5 cm from the anal verge. He received preop chemoradiation therapy with infusional 5-FU. He completed all of this therapy and for pre-2005 and then underwent an APR with colostomy. He has done well he is without any evidence of recurrent disease.   PLAN: Patient will continue to be seen on your yearly basis. I did suggest seeing him on a as needed basis but at this time he is still very comfortable in seeing me once a year.   All questions were answered. The patient knows to call the clinic with any problems, questions or concerns. We can certainly see the patient much sooner if necessary.  I spent 20 minutes counseling the patient face to face. The total time spent in the appointment was 30 minutes.    Drue Second, MD Medical/Oncology [facility] [phone] (beeper) [phone] (Office)

## 2012-04-07 ENCOUNTER — Encounter: Payer: Self-pay | Admitting: Family Medicine

## 2012-04-07 ENCOUNTER — Ambulatory Visit (INDEPENDENT_AMBULATORY_CARE_PROVIDER_SITE_OTHER): Payer: BC Managed Care – PPO | Admitting: Family Medicine

## 2012-04-07 VITALS — BP 158/60 | HR 70 | Temp 97.9°F | Wt 174.0 lb

## 2012-04-07 DIAGNOSIS — J189 Pneumonia, unspecified organism: Secondary | ICD-10-CM | POA: Diagnosis not present

## 2012-04-07 NOTE — Progress Notes (Signed)
  History of Present Illness:  Curtis Campos is a 72 y.o. very pleasant male patient with 50 year pack history who presents for ER follow up.  Notes reviewed.  Was seen at [facility] on 04/03/2012 for presyncopal episode at church.  Was sitting at church and felt a little sweaty, felt like he may faint but he felt better within 10 minutes after EMS came. Had a cough but he is long term smoker with chronic cough so was not alarmed by cough.  No CP or SOB. No fevers.  CXR- ?LLL infiltrate. CE neg. WBC elevated to 12.7.    Diagnosed with CAP and vertigo.  Placed on 6 day course of Levaquin, has completed 3/6 days of this.  Feels good- no recurrent presyncopal episodes and cough has improved.  PMH significant for rectal cancer - stable for 8 years.  Patient Active Problem List  Diagnosis  . Malignant neoplasm of rectum  . HYPERLIPIDEMIA  . NICOTINE ADDICTION  . DEPRESSION, SITUATIONAL  . HYPERTENSION  . Arthritis  . CAP (community acquired pneumonia)   Past Medical History  Diagnosis Date  . Hyperlipidemia   . Arthritis 03/12/2011   Past Surgical History  Procedure Date  . Appendectomy 06/2003  . Resection of rectum    History  Substance Use Topics  . Smoking status: Current Every Day Smoker -- 1.0 packs/day for 50 years    Types: Cigarettes  . Smokeless tobacco: Never Used  . Alcohol Use: No   No family history on file. Allergies  Allergen Reactions  . Codeine     REACTION: nausea  . Statins     REACTION: maylgias   Current Outpatient Prescriptions on File Prior to Visit  Medication Sig Dispense Refill  . aspirin 81 MG tablet Take 81 mg by mouth daily. Takes at night       . ezetimibe (ZETIA) 10 MG tablet Take 1 tablet (10 mg total) by mouth daily.  30 tablet  3  . lisinopril (PRINIVIL,ZESTRIL) 40 MG tablet TAKE ONE TABLET BY MOUTH EVERY DAY  90 tablet  0  . naproxen sodium (ANAPROX) 220 MG tablet Take 220 mg by mouth daily.        Marland Kitchen NIASPAN 1000 MG CR tablet TAKE ONE  TABLET BY MOUTH EVERY DAY AT BEDTIME  90 tablet  1     Past Medical History, Surgical History, Social History, Family History, Problem List, Medications, and Allergies have been reviewed and updated if relevant.  Review of Systems: See HPI  Physical Examination: BP 158/60  Pulse 70  Temp 97.9 F (36.6 C)  Wt 174 lb (78.926 kg)  SpO2 96%  GEN: A and O x 3. WDWN. NAD.    ENT: Nose clear, ext NML.  No LAD.  No JVD.  TM's clear. Oropharynx clear.  PULM: Clear throughout bilaterally without any significant wheezing. No crackles CV: RRR, no M/G/R, No rubs, No JVD.   EXT: warm and well-perfused, No c/c/e. PSYCH: Pleasant and conversant.   Assessment and Plan: 1. CAP (community acquired pneumonia)     New- finish course of levaquin. Pulse ox normal today.   Call or return to clinic prn if these symptoms worsen or fail to improve as anticipated. The patient indicates understanding of these issues and agrees with the plan.

## 2012-04-07 NOTE — Patient Instructions (Addendum)
Good to see you. Finish your antibiotic as directed.  I would try some Debrox for the wax in your ears.

## 2012-04-14 ENCOUNTER — Other Ambulatory Visit (INDEPENDENT_AMBULATORY_CARE_PROVIDER_SITE_OTHER): Payer: BC Managed Care – PPO

## 2012-04-14 DIAGNOSIS — E785 Hyperlipidemia, unspecified: Secondary | ICD-10-CM

## 2012-04-14 LAB — LIPID PANEL
HDL: 64.2 mg/dL (ref >39.00)
Total CHOL/HDL Ratio: 3
VLDL: 14.4 mg/dL (ref 0.0–40.0)

## 2012-04-14 LAB — LDL CHOLESTEROL, DIRECT: Direct LDL: 129.7 mg/dL

## 2012-04-14 LAB — HEPATIC FUNCTION PANEL
Albumin: 4 g/dL (ref 3.5–5.2)
Alkaline Phosphatase: 68 U/L (ref 39–117)
Bilirubin, Direct: 0.1 mg/dL (ref 0.0–0.3)
Total Protein: 7.2 g/dL (ref 6.0–8.3)

## 2012-05-05 ENCOUNTER — Other Ambulatory Visit: Payer: Self-pay | Admitting: Family Medicine

## 2012-06-03 ENCOUNTER — Telehealth: Payer: Self-pay | Admitting: *Deleted

## 2012-06-03 NOTE — Telephone Encounter (Signed)
NOTIFIED RAQUEL BROWNING IN [employer]. SHE WILL CHECK ABOUT PT.'S PAPERWORK.

## 2012-07-06 ENCOUNTER — Other Ambulatory Visit: Payer: Self-pay | Admitting: Family Medicine

## 2012-08-08 ENCOUNTER — Other Ambulatory Visit: Payer: Self-pay | Admitting: Family Medicine

## 2012-08-08 NOTE — Telephone Encounter (Signed)
Pt wife spoke with Turkey at [facility] re; refill on Niacin and [facility] doesn't have rx. Spoke with Turkey and refill just arrived electronically and can be picked up in 15 mins. Catherine notified.

## 2012-10-13 DIAGNOSIS — H2510 Age-related nuclear cataract, unspecified eye: Secondary | ICD-10-CM | POA: Diagnosis not present

## 2012-10-13 DIAGNOSIS — H353190 Nonexudative age-related macular degeneration, unspecified eye, stage unspecified: Secondary | ICD-10-CM | POA: Diagnosis not present

## 2012-10-13 DIAGNOSIS — H524 Presbyopia: Secondary | ICD-10-CM | POA: Diagnosis not present

## 2012-10-13 DIAGNOSIS — H43819 Vitreous degeneration, unspecified eye: Secondary | ICD-10-CM | POA: Diagnosis not present

## 2012-11-07 ENCOUNTER — Other Ambulatory Visit: Payer: Self-pay | Admitting: *Deleted

## 2012-11-07 MED ORDER — LISINOPRIL 40 MG PO TABS: ORAL_TABLET | ORAL | Status: DC

## 2012-12-19 DIAGNOSIS — H02839 Dermatochalasis of unspecified eye, unspecified eyelid: Secondary | ICD-10-CM | POA: Diagnosis not present

## 2012-12-19 DIAGNOSIS — H25019 Cortical age-related cataract, unspecified eye: Secondary | ICD-10-CM | POA: Diagnosis not present

## 2012-12-19 DIAGNOSIS — H18419 Arcus senilis, unspecified eye: Secondary | ICD-10-CM | POA: Diagnosis not present

## 2012-12-19 DIAGNOSIS — H2510 Age-related nuclear cataract, unspecified eye: Secondary | ICD-10-CM | POA: Diagnosis not present

## 2012-12-19 DIAGNOSIS — H25049 Posterior subcapsular polar age-related cataract, unspecified eye: Secondary | ICD-10-CM | POA: Diagnosis not present

## 2013-01-09 DIAGNOSIS — H269 Unspecified cataract: Secondary | ICD-10-CM | POA: Diagnosis not present

## 2013-01-09 DIAGNOSIS — H2510 Age-related nuclear cataract, unspecified eye: Secondary | ICD-10-CM | POA: Diagnosis not present

## 2013-01-09 DIAGNOSIS — Z961 Presence of intraocular lens: Secondary | ICD-10-CM | POA: Diagnosis not present

## 2013-01-17 ENCOUNTER — Ambulatory Visit (INDEPENDENT_AMBULATORY_CARE_PROVIDER_SITE_OTHER): Payer: BC Managed Care – PPO | Admitting: Internal Medicine

## 2013-01-17 ENCOUNTER — Encounter: Payer: Self-pay | Admitting: Internal Medicine

## 2013-01-17 VITALS — BP 128/70 | HR 92 | Temp 98.0°F | Wt 173.0 lb

## 2013-01-17 DIAGNOSIS — I499 Cardiac arrhythmia, unspecified: Secondary | ICD-10-CM

## 2013-01-17 DIAGNOSIS — Z23 Encounter for immunization: Secondary | ICD-10-CM

## 2013-01-17 DIAGNOSIS — F17200 Nicotine dependence, unspecified, uncomplicated: Secondary | ICD-10-CM

## 2013-01-17 DIAGNOSIS — I471 Supraventricular tachycardia: Secondary | ICD-10-CM | POA: Insufficient documentation

## 2013-01-17 DIAGNOSIS — R0602 Shortness of breath: Secondary | ICD-10-CM | POA: Insufficient documentation

## 2013-01-17 MED ORDER — ALPRAZOLAM 0.25 MG PO TABS: 0.2500 mg | ORAL_TABLET | Freq: Every evening | ORAL | Status: DC | PRN

## 2013-01-17 NOTE — Progress Notes (Signed)
  Subjective:    Patient ID: Curtis Campos, male    DOB: Jan 03, 1941, 72 y.o.   MRN: [id number]  HPI Here with wife  "feels like a cross between claustrophobia and a panic attack" Trouble sleeping in the past 2 weeks---up for a long time after getting up to void Up at 3am today--actually got wife up She gave him 1/2 of xanax (0.25)--seemed to help Some sense of trouble breathing --wife gave him inhaler also  Has a few other spells in the past week Only at night No exertional problems  No chronic anxiety Some mild depression since cataract surgery a week ago----went well Has had a family episode--- "she jumped both of [place]" per wife He was hurt by this This was about 2 months ago--and kept kids away for a while (though back seeing them again)  No chest pain Does notice some increased heart rate with the episode Works at refrigeration company--piping,etc. Mostly sedentary  Current Outpatient Prescriptions on File Prior to Visit  Medication Sig Dispense Refill  . aspirin 81 MG tablet Take 81 mg by mouth daily. Takes at night       . lisinopril (PRINIVIL,ZESTRIL) 40 MG tablet TAKE ONE TABLET BY MOUTH EVERY DAY  90 tablet  1  . naproxen sodium (ANAPROX) 220 MG tablet Take 220 mg by mouth daily.        . niacin (NIASPAN) 1000 MG CR tablet TAKE ONE TABLET BY MOUTH EVERY DAY AT BEDTIME  90 tablet  1  . ZETIA 10 MG tablet TAKE ONE TABLET BY MOUTH EVERY DAY  30 tablet  5   No current facility-administered medications on file prior to visit.    Allergies  Allergen Reactions  . Codeine     REACTION: nausea  . Statins     REACTION: maylgias    Past Medical History  Diagnosis Date  . Hyperlipidemia   . Arthritis 03/12/2011    Past Surgical History  Procedure Laterality Date  . Appendectomy  06/2003  . Resection of rectum      No family history on file.  History   Social History  . Marital Status: Married    Spouse Name: N/A    Number of Children: N/A  . Years of  Education: N/A   Occupational History  . Not on file.   Social History Main Topics  . Smoking status: Current Every Day Smoker -- 1.00 packs/day for 50 years    Types: Cigarettes  . Smokeless tobacco: Never Used  . Alcohol Use: No  . Drug Use: No  . Sexual Activity: Yes   Other Topics Concern  . Not on file   Social History Narrative  . No narrative on file   Review of Systems Occasional loose cough No fever Appetite is fine    Objective:   Physical Exam  Constitutional: He appears well-developed and well-nourished. No distress.  Neck: Normal range of motion. Neck supple. No thyromegaly present.  Cardiovascular: Normal rate and intact distal pulses.  Exam reveals no gallop.   Murmur heard. Irregular Very soft aortic systolic murmur  Pulmonary/Chest: Breath sounds normal. No respiratory distress. He has no wheezes. He has no rales.  Abdominal: Soft. There is no tenderness.  Musculoskeletal: He exhibits no edema.  Lymphadenopathy:    He has no cervical adenopathy.  Psychiatric: He has a normal mood and affect. His behavior is normal.          Assessment & Plan:

## 2013-01-17 NOTE — Assessment & Plan Note (Signed)
Looks like multifocal atrial rhthym without tachycardia Discussed cigarette cessation No reason for meds for now

## 2013-01-17 NOTE — Addendum Note (Signed)
Addended by: Sueanne Margarita on: 01/17/2013 02:08 PM   Modules accepted: Orders

## 2013-01-17 NOTE — Assessment & Plan Note (Signed)
Sounds like some anxiety from family issues and affecting him at bedtime No signs of infection or sig lung disease Will give small dose of alprazolam Consider further testing if ongoing symptoms

## 2013-01-17 NOTE — Assessment & Plan Note (Signed)
Heart rhythm highly correlates with COPD No clear symptoms Discussed cessation Gave 1-800 QUIT NOW info

## 2013-01-25 ENCOUNTER — Telehealth: Payer: Self-pay

## 2013-01-25 MED ORDER — CLONAZEPAM 0.5 MG PO TABS: 0.2500 mg | ORAL_TABLET | Freq: Every evening | ORAL | Status: DC | PRN

## 2013-01-25 NOTE — Telephone Encounter (Signed)
Spoke with wife and pt takes Xanax about 9:30 at night and goes to bed around 10, per wife pt wakes up at 12 to use the bathroom, then wakes again at 2am and can't go back to sleep, wife would like something to help pt sleep past 2am. Please advise

## 2013-01-25 NOTE — Telephone Encounter (Signed)
Spoke with patient's wife and advised results rx called into pharmacy

## 2013-01-25 NOTE — Telephone Encounter (Signed)
The xanax in the morning??  If he took it at night and then awoke and couldn't go back to sleep---we could try an anxiety reliever that lasts longer (xanax is very short acting) If he took it in the morning, he should really try it at bedtime

## 2013-01-25 NOTE — Telephone Encounter (Signed)
Curtis Campos,pt's wife said pt took Xanax about 9:30 am and Melatonin OTC at hs. Pt went to sleep but woke up at 2 AM and was unable to return to sleep. [name] said happens almost every night.Please advise. SOB is better but pt still wakes up around 2 AM.Please advise. [facility] [place].

## 2013-01-25 NOTE — Telephone Encounter (Signed)
Please have him stop the alprazolam  Rx for clonazepam 0.5mg  #30 x 0 1/2-1 tab at bedtime prn to help sleep

## 2013-01-30 DIAGNOSIS — H269 Unspecified cataract: Secondary | ICD-10-CM | POA: Diagnosis not present

## 2013-01-30 DIAGNOSIS — H2510 Age-related nuclear cataract, unspecified eye: Secondary | ICD-10-CM | POA: Diagnosis not present

## 2013-01-30 DIAGNOSIS — Z961 Presence of intraocular lens: Secondary | ICD-10-CM | POA: Diagnosis not present

## 2013-02-06 ENCOUNTER — Telehealth: Payer: Self-pay | Admitting: Family Medicine

## 2013-02-06 NOTE — Telephone Encounter (Signed)
Patient Information:  Caller Name: [name]  Phone: [phone]  Patient: Curtis Campos, Curtis Campos  Gender: Male  DOB: Mar 22, 1941  Age: 72 Years  PCP: Tillman Abide [facility])  Office Follow Up:  Does the office need to follow up with this patient?: No  Instructions For The Office: N/A   Symptoms  Reason For Call & Symptoms: Seen in the office on 01/17/13 and taking Clonazepam 1/2 tab to help to sleep but he is still waking up short of breath and having trouble sleeping. He is having panic attacks during the day. Sx started since Cataract Surgery in October 2014 and started on Durezol eye drops TID. Uses wifes Albuterol Inhaler 2 puffs when he feels like can't breath and helps. He has frequent cough since 10/21/4. Pulse Ox = 92% when having breathing difficulty. He snores loudly at night. Wondering if need med for Allergy and his own inhaler.  Reviewed Health History In EMR: Yes  Reviewed Medications In EMR: Yes  Reviewed Allergies In EMR: Yes  Reviewed Surgeries / Procedures: Yes  Date of Onset of Symptoms: 02/09/2013  Treatments Tried: [place]  Treatments Tried Worked: Yes  Guideline(s) Used:  Breathing Difficulty  Cough  Disposition Per Guideline:   See Within 3 Days in Office  Reason For Disposition Reached:   Cough has been present for > 10 days  Advice Given:  Reassurance  Coughing is the way that our lungs remove irritants and mucus. It helps protect our lungs from getting pneumonia.  You can get a dry hacking cough after a chest cold. Sometimes this type of cough can last 1-3 weeks, and be worse at night.  You can also get a cough after being exposed to irritating substances like smoke, strong perfumes, and dust.  Avoid Tobacco Smoke:  Smoking or being exposed to smoke makes coughs much worse.  Prevent Dehydration:  Drink adequate liquids.  This will help soothe an irritated or dry throat and loosen up the phlegm.  Coughing Spasms:  Drink warm fluids. Inhale  warm mist (Reason: both relax the airway and loosen up the phlegm).  Suck on cough drops or hard candy to coat the irritated throat.  Avoid Tobacco Smoke:  Smoking or being exposed to smoke makes coughs much worse.  Expected Course:   The expected course depends on what is causing the cough.  Viral bronchitis (chest cold) causes a cough that lasts 1 to 3 weeks. Sometimes you may cough up lots of phlegm (sputum, mucus). The mucus can normally be white, gray, yellow, or green.  Call Back If:  Difficulty breathing  Cough lasts more than 3 weeks  Fever lasts > 3 days  You become worse.  Patient Will Follow Care Advice:  YES  Appointment Scheduled:  02/07/2013 11:45:00 Appointment Scheduled Provider:  Crawford Givens Clelia Croft) [facility])

## 2013-02-07 ENCOUNTER — Encounter: Payer: Self-pay | Admitting: Family Medicine

## 2013-02-07 ENCOUNTER — Ambulatory Visit (INDEPENDENT_AMBULATORY_CARE_PROVIDER_SITE_OTHER): Payer: BC Managed Care – PPO | Admitting: Family Medicine

## 2013-02-07 VITALS — BP 108/72 | HR 81 | Temp 97.9°F | Wt 172.2 lb

## 2013-02-07 DIAGNOSIS — F418 Other specified anxiety disorders: Secondary | ICD-10-CM

## 2013-02-07 DIAGNOSIS — F411 Generalized anxiety disorder: Secondary | ICD-10-CM | POA: Diagnosis not present

## 2013-02-07 MED ORDER — BUSPIRONE HCL 15 MG PO TABS: 15.0000 mg | ORAL_TABLET | Freq: Two times a day (BID) | ORAL | Status: DC

## 2013-02-07 MED ORDER — ALBUTEROL SULFATE HFA 108 (90 BASE) MCG/ACT IN AERS: 2.0000 | INHALATION_SPRAY | Freq: Four times a day (QID) | RESPIRATORY_TRACT | Status: DC | PRN

## 2013-02-07 NOTE — Progress Notes (Signed)
Pre-visit discussion using our clinic review tool. No additional management support is needed unless otherwise documented below in the visit note.  Vision is much brighter after cataract surgery.    Sleep changes and panic sx in the meantime.  Changed from xanax to klonopin. Sleeping from 10 PM to 2 AM, then awake for 2 hours- anxious.  Then up for the day at 4AM.  Prev he would sleep 6 hours max, 10PM to 4AM.   He does nap in the afternoon, at baseline.   SABA helps with SOB, after about 1-2 minutes.  If he is SOB and doesn't use the SABA, then he won't get better for about 10-20 minutes.  No wheeze.  Some cough, a little, in early AM after rising.  occ sputum.  He's cutting back on smoking.    He can get panicked during the day.  His appetite is decreased.   Family stress is noted.  Worse this fall.  This seems to be the trigger for his sx above.   Meds, vitals, and allergies reviewed.   ROS: See HPI.  Otherwise, noncontributory.  GEN: nad, alert and oriented HEENT: mucous membranes moist NECK: supple w/o LA CV: rrr.  PULM: ctab, no inc wob ABD: soft, +bs EXT: no edema

## 2013-02-07 NOTE — Patient Instructions (Signed)
Take the buspar twice a day.  Use the inhaler if needed for shortness of breath.  Use the klonopin only if needed for sleep.  This should help.  Update [place] if you aren't improving.  Take care.

## 2013-02-08 ENCOUNTER — Other Ambulatory Visit: Payer: Self-pay | Admitting: *Deleted

## 2013-02-08 MED ORDER — NIACIN ER (ANTIHYPERLIPIDEMIC) 1000 MG PO TBCR: EXTENDED_RELEASE_TABLET | ORAL | Status: DC

## 2013-02-08 NOTE — Assessment & Plan Note (Signed)
With family stressor.  This appears to be the trigger.  Would try to limit SABA use and smoking as both could make anxiety/panic worse.  He agrees.  Continue klonopin prn qhs but start buspar BID for the panic sx.  Hopefully he'll need BZD and SABA less with this. Okay for outpatient f/u.  >25 min spent with face to face with patient, >50% counseling and/or coordinating care.

## 2013-02-21 ENCOUNTER — Other Ambulatory Visit: Payer: Self-pay | Admitting: Internal Medicine

## 2013-02-21 ENCOUNTER — Ambulatory Visit (INDEPENDENT_AMBULATORY_CARE_PROVIDER_SITE_OTHER)
Admission: RE | Admit: 2013-02-21 | Discharge: 2013-02-21 | Disposition: A | Discharge status: Home / Self Care | Payer: BC Managed Care – PPO | Source: Ambulatory Visit | Attending: Internal Medicine | Admitting: Internal Medicine

## 2013-02-21 ENCOUNTER — Ambulatory Visit (INDEPENDENT_AMBULATORY_CARE_PROVIDER_SITE_OTHER): Payer: BC Managed Care – PPO | Admitting: Internal Medicine

## 2013-02-21 ENCOUNTER — Encounter: Payer: Self-pay | Admitting: Internal Medicine

## 2013-02-21 VITALS — BP 148/70 | HR 89 | Temp 98.3°F | Wt 169.0 lb

## 2013-02-21 DIAGNOSIS — F39 Unspecified mood [affective] disorder: Secondary | ICD-10-CM

## 2013-02-21 DIAGNOSIS — J449 Chronic obstructive pulmonary disease, unspecified: Secondary | ICD-10-CM | POA: Diagnosis not present

## 2013-02-21 DIAGNOSIS — R0602 Shortness of breath: Secondary | ICD-10-CM

## 2013-02-21 DIAGNOSIS — I498 Other specified cardiac arrhythmias: Secondary | ICD-10-CM

## 2013-02-21 DIAGNOSIS — I471 Supraventricular tachycardia: Secondary | ICD-10-CM

## 2013-02-21 DIAGNOSIS — R9389 Abnormal findings on diagnostic imaging of other specified body structures: Secondary | ICD-10-CM

## 2013-02-21 LAB — BASIC METABOLIC PANEL
BUN: 7 mg/dL (ref 6–23)
CO2: 27 mEq/L (ref 19–32)
Chloride: 99 mEq/L (ref 96–112)
Creatinine, Ser: 0.6 mg/dL (ref 0.4–1.5)
GFR: 137.97 mL/min (ref >60.00)

## 2013-02-21 LAB — CBC WITH DIFFERENTIAL/PLATELET
Basophils Absolute: 0.1 10*3/uL (ref 0.0–0.1)
Eosinophils Absolute: 0.1 10*3/uL (ref 0.0–0.7)
Eosinophils Relative: 1.8 % (ref 0.0–5.0)
MCHC: 32.8 g/dL (ref 30.0–36.0)
MCV: 93.1 fl (ref 78.0–100.0)
Monocytes Absolute: 0.7 10*3/uL (ref 0.1–1.0)
Neutrophils Relative %: 66.6 % (ref 43.0–77.0)
Platelets: 238 10*3/uL (ref 150.0–400.0)
RDW: 14.3 % (ref 11.5–14.6)
WBC: 7.2 10*3/uL (ref 4.5–10.5)

## 2013-02-21 LAB — HEPATIC FUNCTION PANEL
ALT: 20 U/L (ref 0–53)
Albumin: 3.9 g/dL (ref 3.5–5.2)
Total Bilirubin: 1.4 mg/dL — ABNORMAL HIGH (ref 0.3–1.2)

## 2013-02-21 LAB — T4, FREE: Free T4: 1.07 ng/dL (ref 0.60–1.60)

## 2013-02-21 LAB — TSH: TSH: 1.7 u[IU]/mL (ref 0.35–5.50)

## 2013-02-21 NOTE — Assessment & Plan Note (Signed)
Will just check CXR

## 2013-02-21 NOTE — Assessment & Plan Note (Signed)
New anxiety with claustrophobia and mood issues with shortening days, etc This seems all new for him Some inciting family stress which now seems better On the buspirone Clonazepam for sleep

## 2013-02-21 NOTE — Assessment & Plan Note (Signed)
Rate okay but multiple p wave morphologies Working on cigarette cessation

## 2013-02-21 NOTE — Progress Notes (Signed)
Subjective:    Patient ID: Curtis Campos, male    DOB: 25-Apr-1940, 72 y.o.   MRN: [id number]  HPI Here with wife  Feels some better Still gets claustrophobic feeling--- noticed in church recently Still awakens in middle of night and can't get back to sleep Takes 1/2 clonazepam at 8 and 10PM Even reluctant to go downstairs to empty the dehumidifier  Things have settled down in relationship with daughter-in-law No other new stressful situations  Wonders about chemo and RT for colon cancer years ago had any influence  Has had occasional depressed feelings "I hate to see it get dark"  Current Outpatient Prescriptions on File Prior to Visit  Medication Sig Dispense Refill  . albuterol (PROVENTIL HFA;VENTOLIN HFA) 108 (90 BASE) MCG/ACT inhaler Inhale 2 puffs into the lungs every 6 (six) hours as needed for wheezing or shortness of breath.  1 Inhaler  2  . aspirin 81 MG tablet Take 81 mg by mouth daily. Takes at night       . busPIRone (BUSPAR) 15 MG tablet Take 1 tablet (15 mg total) by mouth 2 (two) times daily.  60 tablet  2  . clonazePAM (KLONOPIN) 0.5 MG tablet Take 0.5-1 tablets (0.25-0.5 mg total) by mouth at bedtime as needed.  30 tablet  0  . lisinopril (PRINIVIL,ZESTRIL) 40 MG tablet TAKE ONE TABLET BY MOUTH EVERY DAY  90 tablet  1  . naproxen sodium (ANAPROX) 220 MG tablet Take 220 mg by mouth daily.        . niacin (NIASPAN) 1000 MG CR tablet TAKE ONE TABLET BY MOUTH EVERY DAY AT BEDTIME  90 tablet  0  . ZETIA 10 MG tablet TAKE ONE TABLET BY MOUTH EVERY DAY  30 tablet  5   No current facility-administered medications on file prior to visit.    Allergies  Allergen Reactions  . Codeine     REACTION: nausea  . Statins     REACTION: maylgias    Past Medical History  Diagnosis Date  . Hyperlipidemia   . Arthritis 03/12/2011    Past Surgical History  Procedure Laterality Date  . Appendectomy  06/2003  . Resection of rectum      No family history on  file.  History   Social History  . Marital Status: Married    Spouse Name: N/A    Number of Children: N/A  . Years of Education: N/A   Occupational History  . Not on file.   Social History Main Topics  . Smoking status: Current Every Day Smoker -- 1.00 packs/day for 50 years    Types: Cigarettes  . Smokeless tobacco: Never Used  . Alcohol Use: No  . Drug Use: No  . Sexual Activity: Yes   Other Topics Concern  . Not on file   Social History Narrative  . No narrative on file   Review of Systems Appetite is off Weight down 4# or so Breathing seems better but still easy dyspnea Has tried to cut down on cigarettes    Objective:   Physical Exam  Constitutional: He appears well-developed. No distress.  Neck: Normal range of motion. Neck supple. No thyromegaly present.  Cardiovascular: Normal rate and normal heart sounds.  Exam reveals no gallop.   No murmur heard. Slightly irregular  Pulmonary/Chest: Effort normal and breath sounds normal. No respiratory distress. He has no wheezes. He has no rales.  Musculoskeletal: He exhibits no edema and no tenderness.  Lymphadenopathy:    He has  no cervical adenopathy.  Psychiatric:  Calm now Normal speech          Assessment & Plan:

## 2013-02-22 ENCOUNTER — Other Ambulatory Visit: Payer: Self-pay | Admitting: Internal Medicine

## 2013-02-22 NOTE — Telephone Encounter (Signed)
rx called into pharmacy

## 2013-02-22 NOTE — Telephone Encounter (Signed)
plz phone in. 

## 2013-02-22 NOTE — Telephone Encounter (Signed)
LETVAK PATIENT, Please send back to me for call in  

## 2013-02-27 ENCOUNTER — Encounter: Payer: Self-pay | Admitting: *Deleted

## 2013-03-01 ENCOUNTER — Ambulatory Visit (INDEPENDENT_AMBULATORY_CARE_PROVIDER_SITE_OTHER)
Admission: RE | Admit: 2013-03-01 | Discharge: 2013-03-01 | Disposition: A | Discharge status: Home / Self Care | Payer: BC Managed Care – PPO | Source: Ambulatory Visit | Attending: Internal Medicine | Admitting: Internal Medicine

## 2013-03-01 DIAGNOSIS — R918 Other nonspecific abnormal finding of lung field: Secondary | ICD-10-CM

## 2013-03-01 DIAGNOSIS — R9389 Abnormal findings on diagnostic imaging of other specified body structures: Secondary | ICD-10-CM

## 2013-03-01 DIAGNOSIS — J90 Pleural effusion, not elsewhere classified: Secondary | ICD-10-CM | POA: Diagnosis not present

## 2013-03-01 MED ORDER — IOHEXOL 300 MG/ML  SOLN
80.0000 mL | Freq: Once | INTRAMUSCULAR | Status: AC | PRN
  Administered 2013-03-01: 80 mL via INTRAVENOUS

## 2013-03-02 ENCOUNTER — Other Ambulatory Visit: Payer: Self-pay | Admitting: Internal Medicine

## 2013-03-02 MED ORDER — LEVOFLOXACIN 500 MG PO TABS: 500.0000 mg | ORAL_TABLET | Freq: Every day | ORAL | Status: DC

## 2013-03-10 ENCOUNTER — Encounter: Payer: Self-pay | Admitting: Oncology

## 2013-03-10 ENCOUNTER — Telehealth: Payer: Self-pay | Admitting: Oncology

## 2013-03-10 ENCOUNTER — Other Ambulatory Visit (HOSPITAL_BASED_OUTPATIENT_CLINIC_OR_DEPARTMENT_OTHER): Payer: BC Managed Care – PPO

## 2013-03-10 ENCOUNTER — Ambulatory Visit (HOSPITAL_BASED_OUTPATIENT_CLINIC_OR_DEPARTMENT_OTHER): Payer: BC Managed Care – PPO | Admitting: Oncology

## 2013-03-10 VITALS — BP 149/84 | HR 105 | Temp 98.1°F | Resp 18 | Ht 66.0 in | Wt 168.4 lb

## 2013-03-10 DIAGNOSIS — Z85048 Personal history of other malignant neoplasm of rectum, rectosigmoid junction, and anus: Secondary | ICD-10-CM

## 2013-03-10 DIAGNOSIS — C20 Malignant neoplasm of rectum: Secondary | ICD-10-CM

## 2013-03-10 LAB — COMPREHENSIVE METABOLIC PANEL (CC13)
ALT: 19 U/L (ref 0–55)
Albumin: 3.7 g/dL (ref 3.5–5.0)
Anion Gap: 11 mEq/L (ref 3–11)
CO2: 25 mEq/L (ref 22–29)
Calcium: 9.4 mg/dL (ref 8.4–10.4)
Chloride: 103 mEq/L (ref 98–109)
Creatinine: 0.8 mg/dL (ref 0.7–1.3)
Glucose: 110 mg/dl (ref 70–140)
Sodium: 138 mEq/L (ref 136–145)
Total Protein: 7 g/dL (ref 6.4–8.3)

## 2013-03-10 LAB — CBC WITH DIFFERENTIAL/PLATELET
Basophils Absolute: 0.1 10*3/uL (ref 0.0–0.1)
Eosinophils Absolute: 0.4 10*3/uL (ref 0.0–0.5)
HCT: 38.5 % (ref 38.4–49.9)
HGB: 12.8 g/dL — ABNORMAL LOW (ref 13.0–17.1)
LYMPH%: 17.9 % (ref 14.0–49.0)
MCH: 31.1 pg (ref 27.2–33.4)
MCV: 93.4 fL (ref 79.3–98.0)
MONO#: 0.9 10*3/uL (ref 0.1–0.9)
MONO%: 10.9 % (ref 0.0–14.0)
NEUT#: 5.4 10*3/uL (ref 1.5–6.5)
Platelets: 240 10*3/uL (ref 140–400)
RBC: 4.12 10*6/uL — ABNORMAL LOW (ref 4.20–5.82)
RDW: 14.3 % (ref 11.0–14.6)
WBC: 8.3 10*3/uL (ref 4.0–10.3)

## 2013-03-10 NOTE — Progress Notes (Signed)
OFFICE PROGRESS NOTE  CC  [name], MD [address] [place] [address]  DIAGNOSIS: 72 year old gentleman with adenocarcinoma of the rectum diagnosed December 2004  PRIOR THERAPY:  Patient initially presented with a large rectal mass measuring 3.5 x 3.0 cm about 5.0 cm from the anal verge. He received preop chemoradiation with chemotherapy consisting of infusional 5-FU. He completed all of his therapy in February 2005 and then underwent an APR with colostomy.  CURRENT THERAPY: Observation  INTERVAL HISTORY: Curtis Campos 72 y.o. male returns for followup visit today. Overall he is doing well he continues to work full-time. He is denying any fevers chills night sweats headaches shortness of breath chest pains palpitations he does have some arthritic type of pain in his right knee is applying a salve on it and does tell me that it feels better. Has no myalgias or any other arthralgias. He has not noticed any blood in the colostomy bag. No diarrhea or constipation. Remainder of the 10 point review of systems is negative.  MEDICAL HISTORY: Past Medical History  Diagnosis Date  . Hyperlipidemia   . Arthritis 03/12/2011    ALLERGIES:  is allergic to codeine and statins.  MEDICATIONS:  Current Outpatient Prescriptions  Medication Sig Dispense Refill  . albuterol (PROVENTIL HFA;VENTOLIN HFA) 108 (90 BASE) MCG/ACT inhaler Inhale 2 puffs into the lungs every 6 (six) hours as needed for wheezing or shortness of breath.  1 Inhaler  2  . aspirin 81 MG tablet Take 81 mg by mouth daily. Takes at night       . busPIRone (BUSPAR) 15 MG tablet Take 1 tablet (15 mg total) by mouth 2 (two) times daily.  60 tablet  2  . clonazePAM (KLONOPIN) 0.5 MG tablet TAKE ONE-HALF TO ONE TABLET BY MOUTH AT BEDTIME AS NEEDED  30 tablet  0  . levofloxacin (LEVAQUIN) 500 MG tablet Take 1 tablet (500 mg total) by mouth daily.  10 tablet  0  . lisinopril (PRINIVIL,ZESTRIL) 40 MG tablet TAKE ONE TABLET BY  MOUTH EVERY DAY  90 tablet  1  . naproxen sodium (ANAPROX) 220 MG tablet Take 220 mg by mouth daily.        . niacin (NIASPAN) 1000 MG CR tablet TAKE ONE TABLET BY MOUTH EVERY DAY AT BEDTIME  90 tablet  0  . ZETIA 10 MG tablet TAKE ONE TABLET BY MOUTH EVERY DAY  30 tablet  5   No current facility-administered medications for this visit.    SURGICAL HISTORY:  Past Surgical History  Procedure Laterality Date  . Appendectomy  06/2003  . Resection of rectum      REVIEW OF SYSTEMS:  Pertinent items are noted in HPI.   PHYSICAL EXAMINATION: General appearance: alert, cooperative and appears stated age Head: Normocephalic, without obvious abnormality, atraumatic Neck: no adenopathy, no carotid bruit, no JVD, supple, symmetrical, trachea midline and thyroid not enlarged, symmetric, no tenderness/mass/nodules Lymph nodes: Cervical, supraclavicular, and axillary nodes normal. Resp: clear to auscultation bilaterally and normal percussion bilaterally Back: symmetric, no curvature. ROM normal. No CVA tenderness. Cardio: regular rate and rhythm, S1, S2 normal, no murmur, click, rub or gallop and normal apical impulse GI: soft, non-tender; bowel sounds normal; no masses,  no organomegaly Extremities: extremities normal, atraumatic, no cyanosis or edema Neurologic: Alert and oriented X 3, normal strength and tone. Normal symmetric reflexes. Normal coordination and gait  ECOG PERFORMANCE STATUS: 1 - Symptomatic but completely ambulatory  Blood pressure 149/84, pulse 105, temperature  98.1 F (36.7 C), temperature source Oral, resp. rate 18, height 5\' 6"  (1.676 m), weight 168 lb 6 oz (76.374 kg).  LABORATORY DATA: Lab Results  Component Value Date   WBC 8.3 03/10/2013   HGB 12.8* 03/10/2013   HCT 38.5 03/10/2013   MCV 93.4 03/10/2013   PLT 240 03/10/2013      Chemistry      Component Value Date/Time   NA 138 03/10/2013 1138   NA 135 02/21/2013 1049   NA 142 03/01/2009 1331   K 4.0  03/10/2013 1138   K 3.9 02/21/2013 1049   K 4.5 03/01/2009 1331   CL 99 02/21/2013 1049   CL 101 03/11/2012 1404   CL 99 03/01/2009 1331   CO2 25 03/10/2013 1138   CO2 27 02/21/2013 1049   CO2 30 03/01/2009 1331   BUN 9.7 03/10/2013 1138   BUN 7 02/21/2013 1049   BUN 15 03/01/2009 1331   CREATININE 0.8 03/10/2013 1138   CREATININE 0.6 02/21/2013 1049   CREATININE 0.7 03/01/2009 1331      Component Value Date/Time   CALCIUM 9.4 03/10/2013 1138   CALCIUM 9.3 02/21/2013 1049   CALCIUM 9.2 03/01/2009 1331   ALKPHOS 71 03/10/2013 1138   ALKPHOS 63 02/21/2013 1049   ALKPHOS 68 03/01/2009 1331   AST 22 03/10/2013 1138   AST 26 02/21/2013 1049   AST 26 03/01/2009 1331   ALT 19 03/10/2013 1138   ALT 20 02/21/2013 1049   ALT 28 03/01/2009 1331   BILITOT 1.08 03/10/2013 1138   BILITOT 1.4* 02/21/2013 1049   BILITOT 0.60 03/01/2009 1331       RADIOGRAPHIC STUDIES:  No results found.  ASSESSMENT: 72 year old gentleman with history of adenocarcinoma of the rectum originally diagnosed December 2004 presenting with a 3.5 cm mass 5 cm from the anal verge. He received preop chemoradiation therapy with infusional 5-FU. He completed all of this therapy and for pre-2005 and then underwent an APR with colostomy. He has done well he is without any evidence of recurrent disease.   PLAN: Patient will continue to be seen on your yearly basis. I did suggest seeing him on a as needed basis but at this time he is still very comfortable in seeing me once a year.   All questions were answered. The patient knows to call the clinic with any problems, questions or concerns. We can certainly see the patient much sooner if necessary.  I spent 20 minutes counseling the patient face to face. The total time spent in the appointment was 30 minutes.    Drue Second, MD Medical/Oncology [facility] [phone] (beeper) [phone] (Office)

## 2013-03-10 NOTE — Telephone Encounter (Signed)
, °

## 2013-03-11 LAB — CEA: CEA: 1.3 ng/mL (ref 0.0–5.0)

## 2013-03-13 ENCOUNTER — Other Ambulatory Visit: Payer: Self-pay | Admitting: *Deleted

## 2013-03-13 MED ORDER — EZETIMIBE 10 MG PO TABS: 10.0000 mg | ORAL_TABLET | Freq: Every day | ORAL | Status: DC

## 2013-03-22 ENCOUNTER — Other Ambulatory Visit: Payer: Self-pay | Admitting: Family Medicine

## 2013-03-22 NOTE — Telephone Encounter (Signed)
Ok to refill in Dr. Aron's absence? 

## 2013-03-22 NOTE — Telephone Encounter (Signed)
plz phone in and notify patient. 

## 2013-03-22 NOTE — Telephone Encounter (Signed)
Pt was calling to make sure we received the fax for his Anxiety medication refill

## 2013-03-22 NOTE — Telephone Encounter (Signed)
Pt requesting medication refill. Last ov 01/2013 with upcoming 03/2013 appt. pls advise

## 2013-03-24 ENCOUNTER — Telehealth: Payer: Self-pay

## 2013-03-24 ENCOUNTER — Encounter: Payer: Self-pay | Admitting: Family Medicine

## 2013-03-24 ENCOUNTER — Ambulatory Visit (INDEPENDENT_AMBULATORY_CARE_PROVIDER_SITE_OTHER): Payer: BC Managed Care – PPO | Admitting: Family Medicine

## 2013-03-24 VITALS — BP 132/82 | HR 92 | Temp 97.9°F | Wt 169.0 lb

## 2013-03-24 DIAGNOSIS — F39 Unspecified mood [affective] disorder: Secondary | ICD-10-CM | POA: Diagnosis not present

## 2013-03-24 MED ORDER — CLONAZEPAM 0.5 MG PO TABS: ORAL_TABLET | ORAL | Status: DC

## 2013-03-24 NOTE — Telephone Encounter (Signed)
Rx called into pharmacy as requested. Contacted pt and informed him that Rx had been phoned in

## 2013-03-24 NOTE — Patient Instructions (Signed)
Take up to 1.5 tabs of klonopin at night.  If that doesn't help, then notify Dr. Dayton Martes.  Take care.

## 2013-03-24 NOTE — Telephone Encounter (Signed)
Hours Triage) Fax: [phone] From: [facility] Date/ Time: 03/22/2013 5:36 PM Taken By: Tomasita Crumble, RN Caller: [name] Facility: Not Collected Patient: Curtis Campos, Curtis Campos DOB: 1941/01/27 Phone: [phone] Reason for Call: Caller was unable to be reached on callback - Left Message Regarding Appointment: No Appt Date: Appt Time: Unknown Provider: Reason: Details: Outcome:

## 2013-03-24 NOTE — Assessment & Plan Note (Signed)
Still looks to be a mood issue, with panic/insomia.  Would inc klonopin to 1.5mg  at night.  If not improved, he'll likely need SSRI or other added on.  Will defer to PCP.  He agrees.  Okay for outpatient fu.

## 2013-03-24 NOTE — Progress Notes (Signed)
Pre-visit discussion using our clinic review tool. No additional management support is needed unless otherwise documented below in the visit note.  Waking up early in the AM, ~4AM, waking and "feeling bad".  Anxious and SOB.  Says in the bed and can't get back to sleep; or he'll just go ahead and get up.  "It's like the medicine gives out."  Occ getting SOB later in the day.  Had been claustrophobic at church, less so in the recently.  Can still get claustrophobic in the basement, when watching TV.  He was unsure if the klonopin at night was helping enough.  His family situation is better.  He does not have consistent exertional sx.    He is to f/u with Dr. Dayton Martes about prev lung findings.  D/w pt.   Meds, vitals, and allergies reviewed.   ROS: See HPI.  Otherwise, noncontributory.  nad ncat Mmm rrr ctab abd soft Trace BLE edema Speech and affect wnl

## 2013-03-24 NOTE — Telephone Encounter (Signed)
Hours Triage) Fax: [phone] From: [facility] Date/ Time: 03/22/2013 8:17 PM Taken By: Alphonsa Overall, RN Caller: Algernon Huxley Facility: Not Collected Patient: Curtis Campos, Curtis Campos DOB: 1940-09-07 Phone: [phone] Reason for Call: Caller was unable to be reached on callback - No Answer Regarding Appointment: No Appt Date: Appt Time: Unknown Provider: Reason: Details: Outcome:

## 2013-03-29 ENCOUNTER — Encounter: Payer: Self-pay | Admitting: Family Medicine

## 2013-03-29 ENCOUNTER — Ambulatory Visit (INDEPENDENT_AMBULATORY_CARE_PROVIDER_SITE_OTHER): Payer: BC Managed Care – PPO | Admitting: Family Medicine

## 2013-03-29 VITALS — BP 138/78 | HR 93 | Temp 97.7°F | Ht 67.0 in | Wt 169.0 lb

## 2013-03-29 DIAGNOSIS — G4700 Insomnia, unspecified: Secondary | ICD-10-CM

## 2013-03-29 DIAGNOSIS — F39 Unspecified mood [affective] disorder: Secondary | ICD-10-CM

## 2013-03-29 MED ORDER — MIRTAZAPINE 15 MG PO TABS: 15.0000 mg | ORAL_TABLET | Freq: Every day | ORAL | Status: DC

## 2013-03-29 NOTE — Progress Notes (Signed)
Pre-visit discussion using our clinic review tool. No additional management support is needed unless otherwise documented below in the visit note.  

## 2013-03-29 NOTE — Assessment & Plan Note (Signed)
This is his biggest concern.  He is very fearful that "he will never sleep again." Discussed melatonin- wife has purchased this but he has not tried it. Wean off buspar.  Start Remeron 15 mg qhs.  Hopefully will help with sleep, mood and appetite. Follow up in 3-4 weeks, sooner if symptoms deteriorate. The patient indicates understanding of these issues and agrees with the plan.

## 2013-03-29 NOTE — Assessment & Plan Note (Signed)
Seems more anxious about not sleeping.  See above for tx plan.

## 2013-03-29 NOTE — Patient Instructions (Addendum)
Please wean off the buspar (buspirone)- 1/2 tablet every other day for 5 days and stop. Start remeron (mirtazipine) 15 mg nightly. Ok to continue Klonipin at bed time. Call me next week with an update.  Happy New Year.  Check with your insurance to see if they will cover the shingles shot.

## 2013-03-29 NOTE — Progress Notes (Addendum)
Subjective:    Patient ID: Curtis Campos, male    DOB: [birth year], 71 y.o.   MRN: [id number]  HPI Pleasant 72 yo male here with wife for follow up.  Saw Dr. Alphonsus Sias and Para March during my maternity leave.  Treated with levaquin as CT scan showed ?infection.  Has had some worsening anxiety and insomnia.  Can fall asleep ok but wakes up at 2 am and cannot go back to sleep.  "mind is racing." Does not know what he is anxious about.  Has had some family stressors but feels like it typically doesn't bother him. On Buspar 15 mg twice daily.  Also takes 1 1/2 clonazepam at bedtime.  Feels not helping. Even reluctant to go downstairs to empty the dehumidifier  Has had occasional depressed feelings but feels he is not depressed.  Wife feels he is. Appetite has decreased.  Has lost 10 pounds in 1 month.  Denies any SI or HI. Wt Readings from Last 3 Encounters:  03/29/13 169 lb (76.658 kg)  03/24/13 169 lb (76.658 kg)  03/10/13 168 lb 6 oz (76.374 kg)      Current Outpatient Prescriptions on File Prior to Visit  Medication Sig Dispense Refill  . albuterol (PROVENTIL HFA;VENTOLIN HFA) 108 (90 BASE) MCG/ACT inhaler Inhale 2 puffs into the lungs every 6 (six) hours as needed for wheezing or shortness of breath.  1 Inhaler  2  . aspirin 81 MG tablet Take 81 mg by mouth daily. Takes at night       . busPIRone (BUSPAR) 15 MG tablet Take 1 tablet (15 mg total) by mouth 2 (two) times daily.  60 tablet  2  . clonazePAM (KLONOPIN) 0.5 MG tablet TAKE UP TO 1.5 TABLETS BY MOUTH AT BEDTIME AS NEEDED      . ezetimibe (ZETIA) 10 MG tablet Take 1 tablet (10 mg total) by mouth daily.  90 tablet  3  . lisinopril (PRINIVIL,ZESTRIL) 40 MG tablet TAKE ONE TABLET BY MOUTH EVERY DAY  90 tablet  1  . naproxen sodium (ANAPROX) 220 MG tablet Take 220 mg by mouth daily.        . niacin (NIASPAN) 1000 MG CR tablet TAKE ONE TABLET BY MOUTH EVERY DAY AT BEDTIME  90 tablet  0   No current facility-administered  medications on file prior to visit.    Allergies  Allergen Reactions  . Codeine     REACTION: nausea  . Statins     REACTION: maylgias    Past Medical History  Diagnosis Date  . Hyperlipidemia   . Arthritis 03/12/2011    Past Surgical History  Procedure Laterality Date  . Appendectomy  06/2003  . Resection of rectum      No family history on file.  History   Social History  . Marital Status: Married    Spouse Name: N/A    Number of Children: N/A  . Years of Education: N/A   Occupational History  . Not on file.   Social History Main Topics  . Smoking status: Current Every Day Smoker -- 1.00 packs/day for 50 years    Types: Cigarettes  . Smokeless tobacco: Never Used  . Alcohol Use: No  . Drug Use: No  . Sexual Activity: Yes   Other Topics Concern  . Not on file   Social History Narrative  . No narrative on file   Review of Systems See HPI +anhedonia Objective:   Physical Exam  BP 138/78  Pulse 93  Temp(Src) 97.7 F (36.5 C) (Oral)  Ht 5\' 7"  (1.702 m)  Wt 169 lb (76.658 kg)  BMI 26.46 kg/m2  SpO2 93%  Constitutional: He appears well-developed. No distress.  Neck: Normal range of motion. Neck supple. No thyromegaly present.  Cardiovascular: Normal rate and normal heart sounds.  Exam reveals no gallop.   No murmur heard. Slightly irregular  Pulmonary/Chest: Effort normal and breath sounds normal. No respiratory distress. He has no wheezes. He has no rales.  Musculoskeletal: He exhibits no edema and no tenderness.  Lymphadenopathy:    He has no cervical adenopathy.  Psychiatric:  Normal speech     Assessment & Plan:

## 2013-03-30 DIAGNOSIS — I5022 Chronic systolic (congestive) heart failure: Secondary | ICD-10-CM | POA: Insufficient documentation

## 2013-03-30 DIAGNOSIS — I2510 Atherosclerotic heart disease of native coronary artery without angina pectoris: Secondary | ICD-10-CM

## 2013-03-30 HISTORY — PX: CARDIAC CATHETERIZATION: SHX172

## 2013-03-30 HISTORY — DX: Atherosclerotic heart disease of native coronary artery without angina pectoris: I25.10

## 2013-04-04 ENCOUNTER — Ambulatory Visit: Payer: BC Managed Care – PPO | Admitting: Family Medicine

## 2013-04-07 ENCOUNTER — Other Ambulatory Visit: Payer: Self-pay | Admitting: Internal Medicine

## 2013-04-07 ENCOUNTER — Inpatient Hospital Stay: Payer: Self-pay | Admitting: Internal Medicine

## 2013-04-07 ENCOUNTER — Telehealth: Payer: Self-pay

## 2013-04-07 DIAGNOSIS — I2510 Atherosclerotic heart disease of native coronary artery without angina pectoris: Secondary | ICD-10-CM | POA: Diagnosis not present

## 2013-04-07 DIAGNOSIS — I10 Essential (primary) hypertension: Secondary | ICD-10-CM | POA: Diagnosis not present

## 2013-04-07 DIAGNOSIS — F411 Generalized anxiety disorder: Secondary | ICD-10-CM | POA: Diagnosis present

## 2013-04-07 DIAGNOSIS — E785 Hyperlipidemia, unspecified: Secondary | ICD-10-CM | POA: Diagnosis not present

## 2013-04-07 DIAGNOSIS — I2789 Other specified pulmonary heart diseases: Secondary | ICD-10-CM | POA: Diagnosis present

## 2013-04-07 DIAGNOSIS — Z7982 Long term (current) use of aspirin: Secondary | ICD-10-CM | POA: Diagnosis not present

## 2013-04-07 DIAGNOSIS — F17200 Nicotine dependence, unspecified, uncomplicated: Secondary | ICD-10-CM | POA: Diagnosis not present

## 2013-04-07 DIAGNOSIS — M79609 Pain in unspecified limb: Secondary | ICD-10-CM | POA: Diagnosis not present

## 2013-04-07 DIAGNOSIS — J189 Pneumonia, unspecified organism: Secondary | ICD-10-CM | POA: Diagnosis not present

## 2013-04-07 DIAGNOSIS — J9600 Acute respiratory failure, unspecified whether with hypoxia or hypercapnia: Secondary | ICD-10-CM | POA: Diagnosis not present

## 2013-04-07 DIAGNOSIS — I5021 Acute systolic (congestive) heart failure: Secondary | ICD-10-CM | POA: Diagnosis not present

## 2013-04-07 DIAGNOSIS — I509 Heart failure, unspecified: Secondary | ICD-10-CM | POA: Diagnosis present

## 2013-04-07 DIAGNOSIS — R0902 Hypoxemia: Secondary | ICD-10-CM | POA: Diagnosis not present

## 2013-04-07 DIAGNOSIS — I472 Ventricular tachycardia: Secondary | ICD-10-CM | POA: Diagnosis present

## 2013-04-07 DIAGNOSIS — R0602 Shortness of breath: Secondary | ICD-10-CM | POA: Diagnosis not present

## 2013-04-07 DIAGNOSIS — I4729 Other ventricular tachycardia: Secondary | ICD-10-CM | POA: Diagnosis present

## 2013-04-07 DIAGNOSIS — J441 Chronic obstructive pulmonary disease with (acute) exacerbation: Secondary | ICD-10-CM | POA: Diagnosis not present

## 2013-04-07 DIAGNOSIS — I5023 Acute on chronic systolic (congestive) heart failure: Secondary | ICD-10-CM | POA: Diagnosis not present

## 2013-04-07 DIAGNOSIS — I059 Rheumatic mitral valve disease, unspecified: Secondary | ICD-10-CM | POA: Diagnosis not present

## 2013-04-07 DIAGNOSIS — I428 Other cardiomyopathies: Secondary | ICD-10-CM | POA: Diagnosis present

## 2013-04-07 DIAGNOSIS — Z85048 Personal history of other malignant neoplasm of rectum, rectosigmoid junction, and anus: Secondary | ICD-10-CM | POA: Diagnosis not present

## 2013-04-07 DIAGNOSIS — J90 Pleural effusion, not elsewhere classified: Secondary | ICD-10-CM | POA: Diagnosis not present

## 2013-04-07 LAB — BASIC METABOLIC PANEL
ANION GAP: 9 (ref 7–16)
BUN: 12 mg/dL (ref 7–18)
Calcium, Total: 8.9 mg/dL (ref 8.5–10.1)
Chloride: 99 mmol/L (ref 98–107)
Co2: 27 mmol/L (ref 21–32)
Creatinine: 0.75 mg/dL (ref 0.60–1.30)
EGFR (African American): >60
GLUCOSE: 155 mg/dL — AB (ref 65–99)
Osmolality: 273 (ref 275–301)
POTASSIUM: 3.9 mmol/L (ref 3.5–5.1)
Sodium: 135 mmol/L — ABNORMAL LOW (ref 136–145)

## 2013-04-07 LAB — TROPONIN I: Troponin-I: 0.04 ng/mL

## 2013-04-07 LAB — CBC
HCT: 39.4 % — ABNORMAL LOW (ref 40.0–52.0)
HGB: 13.2 g/dL (ref 13.0–18.0)
MCH: 31 pg (ref 26.0–34.0)
MCHC: 33.5 g/dL (ref 32.0–36.0)
MCV: 93 fL (ref 80–100)
Platelet: 247 10*3/uL (ref 150–440)
RBC: 4.25 10*6/uL — AB (ref 4.40–5.90)
RDW: 15.1 % — ABNORMAL HIGH (ref 11.5–14.5)
WBC: 6.4 10*3/uL (ref 3.8–10.6)

## 2013-04-07 LAB — RAPID INFLUENZA A&B ANTIGENS (ARMC ONLY)

## 2013-04-07 NOTE — Telephone Encounter (Signed)
Dr Deborra Medina advised with those symptoms to take pt to ED; Curtis Campos will take pt to [facility] ED now. Curtis Campos did purchase a pulse ox and that is how she new oxygen level 80-83%).

## 2013-04-07 NOTE — Telephone Encounter (Signed)
Pt requesting medication refill. Last ov 03/29/13. pls advise

## 2013-04-07 NOTE — Telephone Encounter (Signed)
Mrs Defelice left v/m pt is not feeling better and this morning O2 is between 80-83 %. Pt is extremely tired and has problems getting to bathroom.Pt has been treated for anxiety attacks. Pt is SOB, gasping for breath upon any exertion. No chest pain.pt has no appetite. Advised if having SOB and difficulty breathing should go to ED. Mrs Mccallum wants Dr Hulen Shouts opinion.

## 2013-04-07 NOTE — Telephone Encounter (Signed)
Lm on pts vm informing him Rx has been faxed to requested pharmacy

## 2013-04-08 DIAGNOSIS — I059 Rheumatic mitral valve disease, unspecified: Secondary | ICD-10-CM

## 2013-04-08 LAB — BASIC METABOLIC PANEL
ANION GAP: 5 — AB (ref 7–16)
BUN: 10 mg/dL (ref 7–18)
CREATININE: 0.82 mg/dL (ref 0.60–1.30)
Calcium, Total: 8.9 mg/dL (ref 8.5–10.1)
Chloride: 98 mmol/L (ref 98–107)
Co2: 31 mmol/L (ref 21–32)
Glucose: 120 mg/dL — ABNORMAL HIGH (ref 65–99)
OSMOLALITY: 268 (ref 275–301)
Potassium: 4.4 mmol/L (ref 3.5–5.1)
Sodium: 134 mmol/L — ABNORMAL LOW (ref 136–145)

## 2013-04-08 LAB — CBC WITH DIFFERENTIAL/PLATELET
BASOS ABS: 0 10*3/uL (ref 0.0–0.1)
BASOS PCT: 0.2 %
EOS PCT: 0 %
Eosinophil #: 0 10*3/uL (ref 0.0–0.7)
HCT: 42.9 % (ref 40.0–52.0)
HGB: 14.2 g/dL (ref 13.0–18.0)
LYMPHS PCT: 5.9 %
Lymphocyte #: 0.4 10*3/uL — ABNORMAL LOW (ref 1.0–3.6)
MCH: 30.6 pg (ref 26.0–34.0)
MCHC: 33.1 g/dL (ref 32.0–36.0)
MCV: 93 fL (ref 80–100)
MONOS PCT: 2.3 %
Monocyte #: 0.2 x10 3/mm (ref 0.2–1.0)
NEUTROS ABS: 6.2 10*3/uL (ref 1.4–6.5)
Neutrophil %: 91.6 %
Platelet: 283 10*3/uL (ref 150–440)
RBC: 4.63 10*6/uL (ref 4.40–5.90)
RDW: 15.2 % — ABNORMAL HIGH (ref 11.5–14.5)
WBC: 6.7 10*3/uL (ref 3.8–10.6)

## 2013-04-08 LAB — MAGNESIUM: MAGNESIUM: 1.8 mg/dL

## 2013-04-09 DIAGNOSIS — I5021 Acute systolic (congestive) heart failure: Secondary | ICD-10-CM

## 2013-04-09 DIAGNOSIS — I10 Essential (primary) hypertension: Secondary | ICD-10-CM

## 2013-04-10 ENCOUNTER — Encounter: Payer: Self-pay | Admitting: Cardiovascular Disease

## 2013-04-10 ENCOUNTER — Ambulatory Visit: Payer: BC Managed Care – PPO | Admitting: Family Medicine

## 2013-04-10 DIAGNOSIS — I2510 Atherosclerotic heart disease of native coronary artery without angina pectoris: Secondary | ICD-10-CM

## 2013-04-10 HISTORY — PX: RIGHT/LEFT HEART CATH AND CORONARY ANGIOGRAPHY: CATH118266

## 2013-04-11 DIAGNOSIS — I2510 Atherosclerotic heart disease of native coronary artery without angina pectoris: Secondary | ICD-10-CM

## 2013-04-11 DIAGNOSIS — I5023 Acute on chronic systolic (congestive) heart failure: Secondary | ICD-10-CM

## 2013-04-11 LAB — BASIC METABOLIC PANEL
ANION GAP: 3 — AB (ref 7–16)
BUN: 26 mg/dL — AB (ref 7–18)
CHLORIDE: 97 mmol/L — AB (ref 98–107)
CO2: 34 mmol/L — AB (ref 21–32)
CREATININE: 0.87 mg/dL (ref 0.60–1.30)
Calcium, Total: 8.8 mg/dL (ref 8.5–10.1)
EGFR (Non-African Amer.): >60
Glucose: 127 mg/dL — ABNORMAL HIGH (ref 65–99)
Osmolality: 275 (ref 275–301)
Potassium: 4.2 mmol/L (ref 3.5–5.1)
Sodium: 134 mmol/L — ABNORMAL LOW (ref 136–145)

## 2013-04-11 LAB — MAGNESIUM: Magnesium: 2.3 mg/dL

## 2013-04-12 LAB — BASIC METABOLIC PANEL
Anion Gap: 1 — ABNORMAL LOW (ref 7–16)
BUN: 28 mg/dL — AB (ref 7–18)
CHLORIDE: 95 mmol/L — AB (ref 98–107)
Calcium, Total: 8.6 mg/dL (ref 8.5–10.1)
Co2: 35 mmol/L — ABNORMAL HIGH (ref 21–32)
Creatinine: 0.87 mg/dL (ref 0.60–1.30)
EGFR (African American): >60
GLUCOSE: 97 mg/dL (ref 65–99)
OSMOLALITY: 268 (ref 275–301)
POTASSIUM: 4.4 mmol/L (ref 3.5–5.1)
Sodium: 131 mmol/L — ABNORMAL LOW (ref 136–145)

## 2013-04-12 LAB — CULTURE, BLOOD (SINGLE)

## 2013-04-13 ENCOUNTER — Encounter: Payer: Self-pay | Admitting: Cardiovascular Disease

## 2013-04-13 LAB — BASIC METABOLIC PANEL
Anion Gap: 0 — ABNORMAL LOW (ref 7–16)
BUN: 27 mg/dL — ABNORMAL HIGH (ref 7–18)
CREATININE: 0.86 mg/dL (ref 0.60–1.30)
Calcium, Total: 8.6 mg/dL (ref 8.5–10.1)
Chloride: 94 mmol/L — ABNORMAL LOW (ref 98–107)
Co2: 38 mmol/L — ABNORMAL HIGH (ref 21–32)
EGFR (African American): >60
EGFR (Non-African Amer.): >60
Glucose: 88 mg/dL (ref 65–99)
Osmolality: 269 (ref 275–301)
Potassium: 4.6 mmol/L (ref 3.5–5.1)
Sodium: 132 mmol/L — ABNORMAL LOW (ref 136–145)

## 2013-04-13 LAB — MAGNESIUM: Magnesium: 2.6 mg/dL — ABNORMAL HIGH

## 2013-04-14 ENCOUNTER — Encounter: Payer: Self-pay | Admitting: *Deleted

## 2013-04-14 ENCOUNTER — Telehealth: Payer: Self-pay | Admitting: *Deleted

## 2013-04-14 NOTE — Telephone Encounter (Signed)
Patient contacted regarding discharge from [facility] on 04/13/13.  Patient understands to follow up with provider Dr. Fletcher Anon on 04/25/13 at [place] [place]. Patient understands discharge instructions? yes Patient understands medications and regiment? yes Patient understands to bring all medications to this visit? yes

## 2013-04-17 ENCOUNTER — Telehealth: Payer: Self-pay | Admitting: *Deleted

## 2013-04-17 DIAGNOSIS — I509 Heart failure, unspecified: Secondary | ICD-10-CM

## 2013-04-17 NOTE — Telephone Encounter (Signed)
We can make referral.

## 2013-04-17 NOTE — Telephone Encounter (Signed)
Pt has a TCM appt w/ Dr. Fletcher Anon on 04/25/13, but has not yet been seen in the office. I don't see recommendation for home health in discharge instructions.   Would you like for our office to make referral or PCP? Please advise.  Thank you!

## 2013-04-17 NOTE — Telephone Encounter (Signed)
Wife called and patient has agreed to home health services. Please call wife.

## 2013-04-18 NOTE — Telephone Encounter (Signed)
Spoke with patient. Informed him that home health referral has been placed.

## 2013-04-18 NOTE — Telephone Encounter (Signed)
Spoke w/ Melissa from [address].   Due to medicare regulations, pt must have face-to-face documentation in order for home health care to be covered.  We have not seen pt in the office, as he is scheduled for a TCM appt on 04/25/13. Pt was seen inpt at [facility] from 1/9/-04/13/13, but there is no documentation in Dr. Tyrell Antonio notes of a home health referral. There is documentation that Dr. Leslye Peer referred for home O2. Spoke w/ Everlean Patterson at [facility] and informed him of what is going on.  He asked that I send [facility] notes to him for review and if not covered, we may need to schedule pt to come in sooner than 04/25/13. Faxed Dr. Tyrell Antonio notes to [place] at [phone].

## 2013-04-20 ENCOUNTER — Encounter: Payer: Self-pay | Admitting: Radiology

## 2013-04-21 ENCOUNTER — Ambulatory Visit (INDEPENDENT_AMBULATORY_CARE_PROVIDER_SITE_OTHER): Payer: BC Managed Care – PPO | Admitting: Family Medicine

## 2013-04-21 ENCOUNTER — Encounter: Payer: Self-pay | Admitting: Family Medicine

## 2013-04-21 VITALS — BP 115/67 | HR 48 | Temp 97.5°F | Ht 67.0 in | Wt 155.8 lb

## 2013-04-21 DIAGNOSIS — J189 Pneumonia, unspecified organism: Secondary | ICD-10-CM

## 2013-04-21 DIAGNOSIS — J441 Chronic obstructive pulmonary disease with (acute) exacerbation: Secondary | ICD-10-CM

## 2013-04-21 DIAGNOSIS — J9620 Acute and chronic respiratory failure, unspecified whether with hypoxia or hypercapnia: Secondary | ICD-10-CM

## 2013-04-21 DIAGNOSIS — Z2911 Encounter for prophylactic immunotherapy for respiratory syncytial virus (RSV): Secondary | ICD-10-CM

## 2013-04-21 DIAGNOSIS — R0602 Shortness of breath: Secondary | ICD-10-CM

## 2013-04-21 DIAGNOSIS — Z23 Encounter for immunization: Secondary | ICD-10-CM | POA: Diagnosis not present

## 2013-04-21 DIAGNOSIS — J9601 Acute respiratory failure with hypoxia: Secondary | ICD-10-CM | POA: Insufficient documentation

## 2013-04-21 DIAGNOSIS — J449 Chronic obstructive pulmonary disease, unspecified: Secondary | ICD-10-CM | POA: Insufficient documentation

## 2013-04-21 DIAGNOSIS — F17200 Nicotine dependence, unspecified, uncomplicated: Secondary | ICD-10-CM

## 2013-04-21 DIAGNOSIS — I428 Other cardiomyopathies: Secondary | ICD-10-CM | POA: Diagnosis not present

## 2013-04-21 DIAGNOSIS — I429 Cardiomyopathy, unspecified: Secondary | ICD-10-CM | POA: Insufficient documentation

## 2013-04-21 DIAGNOSIS — I10 Essential (primary) hypertension: Secondary | ICD-10-CM

## 2013-04-21 DIAGNOSIS — E785 Hyperlipidemia, unspecified: Secondary | ICD-10-CM

## 2013-04-21 DIAGNOSIS — I509 Heart failure, unspecified: Secondary | ICD-10-CM

## 2013-04-21 LAB — BASIC METABOLIC PANEL
BUN: 16 mg/dL (ref 6–23)
CALCIUM: 9.1 mg/dL (ref 8.4–10.5)
CO2: 31 mEq/L (ref 19–32)
CREATININE: 0.8 mg/dL (ref 0.4–1.5)
Chloride: 90 mEq/L — ABNORMAL LOW (ref 96–112)
GFR: 95.33 mL/min (ref >60.00)
Glucose, Bld: 67 mg/dL — ABNORMAL LOW (ref 70–99)
Potassium: 4.5 mEq/L (ref 3.5–5.1)
Sodium: 128 mEq/L — ABNORMAL LOW (ref 135–145)

## 2013-04-21 LAB — BRAIN NATRIURETIC PEPTIDE: Pro B Natriuretic peptide (BNP): 182 pg/mL — ABNORMAL HIGH (ref 0.0–100.0)

## 2013-04-21 MED ORDER — CLONAZEPAM 0.5 MG PO TABS: ORAL_TABLET | ORAL | Status: DC

## 2013-04-21 NOTE — Assessment & Plan Note (Signed)
Appears euvolemic currently. Will check BNP and BMET today and send results to Dr. Fletcher Anon as well.  Has follow up with him on 1/27.  Pt aware of this appt.

## 2013-04-21 NOTE — Assessment & Plan Note (Signed)
Well controlled but may need to decrease meds if he has anymore episodes of dizziness.

## 2013-04-21 NOTE — Progress Notes (Signed)
Pre-visit discussion using our clinic review tool. No additional management support is needed unless otherwise documented below in the visit note.  

## 2013-04-21 NOTE — Addendum Note (Signed)
Addended by: Modena Nunnery on: 04/21/2013 01:46 PM   Modules accepted: Orders

## 2013-04-21 NOTE — Assessment & Plan Note (Signed)
Referral placed for home health. Needs PT/OT for deconditioning.  Would also benefit from nursing as he needs his O2 sats and weight monitored.

## 2013-04-21 NOTE — Progress Notes (Signed)
Subjective:    Patient ID: Curtis Campos, male    DOB: [birth year], 73 y.o.   MRN: [id number]  HPI  Very pleasant 73 yo male here with his wife for hospital follow up.  Notes reviewed.  Admitted to [facility] 1/9-1/15/2015.  Admitted with progressive SOB and acute respiratory failure felt to be due to PNA (CT scan obtained last month showed ? Infiltrate (which was treated at that time). Started on Ctx , solumedrol, and  Azithromycin.  CXR showed bilateral small pleural effusions, left greater than right with bibasilar airspace disease- infection could not be exlucded.  2 D echo showed CHF with EF of 25 to 30%, severe MR.  Cardiac cath on 1/12 showed severe one vessel CAD. TEE showed moderate MR.  Discharged home on continuous O2 for his respiratory failure.  Also sent home on Ceftin and prednisone taper.  Started on Spiriva and Advair as well for COPD.  CHF with cardiomyopathy and severe MR- Dr. Fletcher Anon advises medical management for MR. Metoprolol added along with oral lasix.  He admits to feeling dizzy one time when he bent over.  SOB has improved, using O2 at all times.  He does admit to feeling weak and deconditioned.  Also wants help with home O2 and weights for CHF which I agree would be very appropriate for home health referral.  Patient Active Problem List   Diagnosis Date Noted  . COPD exacerbation 04/21/2013  . Acute-on-chronic respiratory failure 04/21/2013  . Insomnia 03/29/2013  . SOB (shortness of breath) 01/17/2013  . Multifocal atrial tachycardia 01/17/2013  . CAP (community acquired pneumonia) 04/07/2012  . Arthritis 03/12/2011  . Episodic mood disorder 04/06/2008  . HYPERTENSION 03/06/2008  . Nicotine dependence 03/10/2007  . HYPERLIPIDEMIA 03/18/2006  . Malignant neoplasm of rectum 03/31/2003   Past Medical History  Diagnosis Date  . Hyperlipidemia   . Arthritis 03/12/2011  . Renal cancer   . Rectal cancer   . Hypertension   . CHF (congestive heart failure)    . Chronic obstructive lung disease    Past Surgical History  Procedure Laterality Date  . Appendectomy  06/2003  . Resection of rectum    . Hernia repair    . Colostomy    . Cataract surgery    . Cardiac catheterization  03/2013    [facility]   History  Substance Use Topics  . Smoking status: Current Every Day Smoker -- 1.00 packs/day for 50 years    Types: Cigarettes  . Smokeless tobacco: Never Used  . Alcohol Use: No   No family history on file. Allergies  Allergen Reactions  . Codeine     REACTION: nausea  . Statins     REACTION: maylgias   Current Outpatient Prescriptions on File Prior to Visit  Medication Sig Dispense Refill  . albuterol (PROVENTIL HFA;VENTOLIN HFA) 108 (90 BASE) MCG/ACT inhaler Inhale 2 puffs into the lungs every 6 (six) hours as needed for wheezing or shortness of breath.  1 Inhaler  2  . ALPRAZolam (XANAX) 0.25 MG tablet TAKE ONE TABLET BY MOUTH ONCE DAILY AT BEDTIME AS NEEDED FOR SLEEP OR  ANXIETY  20 tablet  0  . aspirin 81 MG tablet Take 81 mg by mouth daily. Takes at night       . cefUROXime (CEFTIN) 500 MG tablet Take 500 mg by mouth 2 (two) times daily with a meal.      . clonazePAM (KLONOPIN) 0.5 MG tablet TAKE UP TO 1.5 TABLETS BY MOUTH  AT BEDTIME AS NEEDED      . ezetimibe (ZETIA) 10 MG tablet Take 1 tablet (10 mg total) by mouth daily.  90 tablet  3  . Fluticasone-Salmeterol (ADVAIR) 250-50 MCG/DOSE AEPB Inhale 1 puff into the lungs 2 (two) times daily.      . furosemide (LASIX) 40 MG tablet Take 40 mg by mouth daily.      Marland Kitchen lisinopril (PRINIVIL,ZESTRIL) 40 MG tablet TAKE ONE TABLET BY MOUTH EVERY DAY  90 tablet  1  . magnesium oxide (MAG-OX) 400 MG tablet Take 400 mg by mouth daily.      . metoprolol tartrate (LOPRESSOR) 25 MG tablet Take 25 mg by mouth 2 (two) times daily.      . mirtazapine (REMERON) 15 MG tablet Take 1 tablet (15 mg total) by mouth at bedtime.  30 tablet  1  . naproxen sodium (ANAPROX) 220 MG tablet Take 220 mg by mouth  daily.        . niacin (NIASPAN) 1000 MG CR tablet TAKE ONE TABLET BY MOUTH EVERY DAY AT BEDTIME  90 tablet  0  . nicotine (NICODERM CQ - DOSED IN MG/24 HOURS) 14 mg/24hr patch Place 14 mg onto the skin daily.      . potassium chloride SA (K-DUR,KLOR-CON) 20 MEQ tablet Take 20 mEq by mouth daily.      . predniSONE (STERAPRED UNI-PAK) 5 MG TABS tablet Take by mouth daily.      Marland Kitchen tiotropium (SPIRIVA) 18 MCG inhalation capsule Place 18 mcg into inhaler and inhale daily.       No current facility-administered medications on file prior to visit.   The PMH, PSH, Social History, Family History, Medications, and allergies have been reviewed in [facility], and have been updated if relevant.    Review of Systems    See HPI +epistaxis Sleeping much better Objective:   Physical Exam BP 115/67  Pulse 48  Temp(Src) 97.5 F (36.4 C) (Oral)  Ht 5\' 7"  (1.702 m)  Wt 155 lb 12 oz (70.648 kg)  BMI 24.39 kg/m2  SpO2 95% Gen: Alert, appears fatigued but NAD, O2 per [place] Resp:  CTA bilaterally, decreased BS at bases CVS:  Mildly irregular Ext:  NO edema Psych:  Good eye contact, not anxious appearing       Assessment & Plan:

## 2013-04-21 NOTE — Patient Instructions (Signed)
Great to see you, Curtis Campos. I will call you with your lab results.  Please stop by to see Rosaria Ferries on your way out to set up your home health referral.  I will keep in touch with Dr. Fletcher Anon as well.

## 2013-04-21 NOTE — Assessment & Plan Note (Addendum)
Improved with O2.   Did advised he ask for humidifier for O2 concentrator to help with nosebleeds.

## 2013-04-24 ENCOUNTER — Telehealth: Payer: Self-pay | Admitting: Family Medicine

## 2013-04-24 NOTE — Telephone Encounter (Signed)
Relevant patient education mailed to patient.  

## 2013-04-25 ENCOUNTER — Encounter: Payer: Self-pay | Admitting: Cardiovascular Disease

## 2013-04-25 ENCOUNTER — Telehealth: Payer: Self-pay | Admitting: *Deleted

## 2013-04-25 ENCOUNTER — Ambulatory Visit (INDEPENDENT_AMBULATORY_CARE_PROVIDER_SITE_OTHER): Payer: BC Managed Care – PPO | Admitting: Cardiovascular Disease

## 2013-04-25 VITALS — BP 78/50 | HR 104 | Ht 66.0 in | Wt 152.8 lb

## 2013-04-25 DIAGNOSIS — I2510 Atherosclerotic heart disease of native coronary artery without angina pectoris: Secondary | ICD-10-CM | POA: Diagnosis not present

## 2013-04-25 DIAGNOSIS — R0602 Shortness of breath: Secondary | ICD-10-CM

## 2013-04-25 DIAGNOSIS — I5022 Chronic systolic (congestive) heart failure: Secondary | ICD-10-CM

## 2013-04-25 DIAGNOSIS — I509 Heart failure, unspecified: Secondary | ICD-10-CM | POA: Diagnosis not present

## 2013-04-25 DIAGNOSIS — J441 Chronic obstructive pulmonary disease with (acute) exacerbation: Secondary | ICD-10-CM

## 2013-04-25 DIAGNOSIS — I10 Essential (primary) hypertension: Secondary | ICD-10-CM | POA: Diagnosis not present

## 2013-04-25 DIAGNOSIS — I471 Supraventricular tachycardia: Secondary | ICD-10-CM

## 2013-04-25 DIAGNOSIS — I340 Nonrheumatic mitral (valve) insufficiency: Secondary | ICD-10-CM

## 2013-04-25 DIAGNOSIS — I498 Other specified cardiac arrhythmias: Secondary | ICD-10-CM | POA: Diagnosis not present

## 2013-04-25 DIAGNOSIS — I059 Rheumatic mitral valve disease, unspecified: Secondary | ICD-10-CM

## 2013-04-25 MED ORDER — LISINOPRIL 10 MG PO TABS: 10.0000 mg | ORAL_TABLET | Freq: Every day | ORAL | Status: DC

## 2013-04-25 MED ORDER — SPIRONOLACTONE 25 MG PO TABS
25.0000 mg | ORAL_TABLET | Freq: Every day | ORAL | Status: DC
Start: 2013-04-25 — End: 2013-04-25

## 2013-04-25 MED ORDER — METOPROLOL SUCCINATE ER 50 MG PO TB24: 50.0000 mg | ORAL_TABLET | Freq: Every day | ORAL | Status: DC

## 2013-04-25 MED ORDER — FUROSEMIDE 20 MG PO TABS: 20.0000 mg | ORAL_TABLET | Freq: Every day | ORAL | Status: DC

## 2013-04-25 NOTE — Telephone Encounter (Signed)
Received prior auth request for Advair. Auth paperwork obtained and placed in your inbox.

## 2013-04-25 NOTE — Patient Instructions (Addendum)
Do not drink more than 50 oz of water/day. Follow a low Sodium diet (less than 3 grams/day).   Your physician recommends that you schedule a follow-up appointment in:  Thursday Jan 29  Your physician recommends that you return for lab work in:  Thursday Jan 29  Your physician has recommended you make the following change in your medication:  Decrease Lisinopril 10 mg daily  Decrease Lasix 20 mg daily Stop Potassium  Stop Metoprolol Tartrate  Start Metoprolol Succinate 50 mg daily   Hold cardiac medications until systolic blood pressure is greater than 90.

## 2013-04-26 ENCOUNTER — Encounter: Payer: Self-pay | Admitting: Cardiovascular Disease

## 2013-04-26 DIAGNOSIS — I340 Nonrheumatic mitral (valve) insufficiency: Secondary | ICD-10-CM | POA: Insufficient documentation

## 2013-04-26 NOTE — Assessment & Plan Note (Signed)
The valve appears to be normal in structure. Continue medical therapy for this.

## 2013-04-26 NOTE — Assessment & Plan Note (Signed)
Oxygen saturation was 94% on room air today. It seems like he can use oxygen as needed for now.

## 2013-04-26 NOTE — Assessment & Plan Note (Addendum)
The patient's fluid overload improved significantly. Ejection fraction was 25%. He is significantly orthostatic today blood pressure is low. I advised that we admit him to the hospital to adjust his medications given his significant hypotension. However, the patient declined that option as he feels better overall and does not see the need to do that. They are going to purchase a blood pressure machine. I instructed them to hold blood pressure medications and diuretics until blood pressure is above 90 systolic. I am decreasing lisinopril to 10 mg once daily and switching metoprolol tartrate to metoprolol succinate. Due to significant resting tachycardia and history of COPD, I am not planning to switch him to carvedilol. Decrease Lasix to 20 mg once daily. I will have him come back to see me on Thursday And we'll checkCBC and basic metabolic profile at that time.  The next step is to add spironolactone.

## 2013-04-26 NOTE — Progress Notes (Signed)
HPI  This is a pleasant 73 year old man who is here today for a followup visit after recent hospitalization for acute systolic heart failure. He has known history of hypertension, tobacco use and COPD. He presented recently to [facility] with progressive dyspnea, orthopnea and PND which started in October and was progressive since that time. He was initially thought to have an anxiety disorder. He quit smoking in December. He was hospitalized earlier this month at [facility] due to shortness of breath. He was initially thought of having pneumonia. However, his BNP was elevated. Chest x-ray showed fluid overload and cardiomegaly. Echocardiogram showed an ejection fraction of 25% with inferoposterior akinesis and severe mitral regurgitation with posterior jet. He improved with IV Lasix. He underwent a right and left cardiac catheterization which showed severe one-vessel coronary artery disease with a subtotal occlusion of the mid left circumflex which appeared to be chronic. TEE was performed which showed that the mitral valve was normal in structure and regurgitation was moderate after optimizing his volume status. He was discharged home on oxygen due to persistent hypoxia. Since hospital discharge, he is active feeling significantly better with no symptoms of orthopnea or PND anymore. However, he is dizzy when he stands up. He is very orthostatic today. He had recent basic metabolic profile few days ago which showed normal renal function. He denies any chest discomfort.  Allergies  Allergen Reactions  . Codeine     REACTION: nausea  . Statins     REACTION: maylgias     Current Outpatient Prescriptions on File Prior to Visit  Medication Sig Dispense Refill  . albuterol (PROVENTIL HFA;VENTOLIN HFA) 108 (90 BASE) MCG/ACT inhaler Inhale 2 puffs into the lungs every 6 (six) hours as needed for wheezing or shortness of breath.  1 Inhaler  2  . ALPRAZolam (XANAX) 0.25 MG tablet TAKE ONE TABLET BY MOUTH ONCE DAILY  AT BEDTIME AS NEEDED FOR SLEEP OR  ANXIETY  20 tablet  0  . aspirin 81 MG tablet Take 81 mg by mouth daily. Takes at night       . clonazePAM (KLONOPIN) 0.5 MG tablet TAKE UP TO 1.5 TABLETS BY MOUTH AT BEDTIME AS NEEDED  60 tablet  0  . ezetimibe (ZETIA) 10 MG tablet Take 1 tablet (10 mg total) by mouth daily.  90 tablet  3  . Fluticasone-Salmeterol (ADVAIR) 250-50 MCG/DOSE AEPB Inhale 1 puff into the lungs 2 (two) times daily.      . magnesium oxide (MAG-OX) 400 MG tablet Take 400 mg by mouth daily.      . naproxen sodium (ANAPROX) 220 MG tablet Take 220 mg by mouth daily.        . niacin (NIASPAN) 1000 MG CR tablet TAKE ONE TABLET BY MOUTH EVERY DAY AT BEDTIME  90 tablet  0  . tiotropium (SPIRIVA) 18 MCG inhalation capsule Place 18 mcg into inhaler and inhale daily.       No current facility-administered medications on file prior to visit.     Past Medical History  Diagnosis Date  . Hyperlipidemia   . Arthritis 03/12/2011  . Renal cancer   . Rectal cancer   . Hypertension   . CHF (congestive heart failure)   . Chronic obstructive lung disease   . Mitral regurgitation   . Coronary artery disease 03/2013    EF of 25%, moderate to severe mitral regurgitation. Catheterization showed normal cardiac output with mild pulmonary hypertension. There was 99% stenosis in the mid left circumflex which  appeared to be chronic with akinetic basal inferior/posterior wall. Mild LAD and RCA disease.  . Chronic systolic heart failure 95/1884    EF of 25% with moderate to severe mitral regurgitation     Past Surgical History  Procedure Laterality Date  . Appendectomy  06/2003  . Resection of rectum    . Hernia repair    . Colostomy    . Cataract surgery    . Cardiac catheterization  03/2013    [facility]     Family History  Problem Relation Age of Onset  . Heart disease Mother   . Heart disease Father   . Hyperlipidemia Father   . Hypertension Father   . Heart attack Brother 23    MI  .  Heart disease Sister     stents placed   . Heart disease Sister     CABG     History   Social History  . Marital Status: Married    Spouse Name: N/A    Number of Children: N/A  . Years of Education: N/A   Occupational History  . Not on file.   Social History Main Topics  . Smoking status: Former Smoker -- 1.00 packs/day for 50 years    Types: Cigarettes    Quit date: 03/25/2013  . Smokeless tobacco: Never Used  . Alcohol Use: No  . Drug Use: No  . Sexual Activity: Yes   Other Topics Concern  . Not on file   Social History Narrative  . No narrative on file     PHYSICAL EXAM   BP 78/50  Pulse 104  Ht 5\' 6"  (1.676 m)  Wt 152 lb 12 oz (69.287 kg)  BMI 24.67 kg/m2 Constitutional: He is oriented to person, place, and time. He appears well-developed and well-nourished. No distress.  HENT: No nasal discharge.  Head: Normocephalic and atraumatic.  Eyes: Pupils are equal and round.  No discharge. Neck: Normal range of motion. Neck supple. No JVD present. No thyromegaly present.  Cardiovascular: Normal rate, regular rhythm, normal heart sounds. Exam reveals no gallop and no friction rub. There is a 2/6 holosystolic murmur at the apex and the left sternal border Pulmonary/Chest: Effort normal and breath sounds normal. No stridor. No respiratory distress. He has no wheezes. He has no rales. He exhibits no tenderness.  Abdominal: Soft. Bowel sounds are normal. He exhibits no distension. There is no tenderness. There is no rebound and no guarding.  Musculoskeletal: Normal range of motion. He exhibits no edema and no tenderness.  Neurological: He is alert and oriented to person, place, and time. Coordination normal.  Skin: Skin is warm and dry. No rash noted. He is not diaphoretic. No erythema. No pallor.  Psychiatric: He has a normal mood and affect. His behavior is normal. Judgment and thought content normal.       ZYS:AYTKZ rhythm with frequent PACs Voltage criteria  for LVH  (R(V5) exceeds 2.60 mV).   -Nonspecific ST depression   +   Extensive T-abnormality  -Seen with left ventricular hypertrophy (strain) or digitalis effect  -Possible  Anterolateral and inferior  ischemia.   ABNORMAL    ASSESSMENT AND PLAN

## 2013-04-26 NOTE — Assessment & Plan Note (Signed)
I suspect that the patient had myocardial infarction in October or November with gradual decline in LV systolic function. He currently has no convincing symptoms of angina. The subtotal occlusion in mid left circumflex appears to be chronic and thus I would plan on treating him medically for now.

## 2013-04-27 ENCOUNTER — Encounter: Payer: Self-pay | Admitting: *Deleted

## 2013-04-27 ENCOUNTER — Ambulatory Visit (INDEPENDENT_AMBULATORY_CARE_PROVIDER_SITE_OTHER): Payer: BC Managed Care – PPO | Admitting: *Deleted

## 2013-04-27 ENCOUNTER — Ambulatory Visit (INDEPENDENT_AMBULATORY_CARE_PROVIDER_SITE_OTHER): Payer: BC Managed Care – PPO | Admitting: Cardiovascular Disease

## 2013-04-27 ENCOUNTER — Encounter: Payer: Self-pay | Admitting: Cardiovascular Disease

## 2013-04-27 VITALS — BP 112/82 | HR 92 | Ht 66.0 in | Wt 154.5 lb

## 2013-04-27 DIAGNOSIS — I498 Other specified cardiac arrhythmias: Secondary | ICD-10-CM

## 2013-04-27 DIAGNOSIS — I509 Heart failure, unspecified: Secondary | ICD-10-CM | POA: Diagnosis not present

## 2013-04-27 DIAGNOSIS — I059 Rheumatic mitral valve disease, unspecified: Secondary | ICD-10-CM

## 2013-04-27 DIAGNOSIS — I4719 Other supraventricular tachycardia: Secondary | ICD-10-CM

## 2013-04-27 DIAGNOSIS — C20 Malignant neoplasm of rectum: Secondary | ICD-10-CM

## 2013-04-27 DIAGNOSIS — I5022 Chronic systolic (congestive) heart failure: Secondary | ICD-10-CM

## 2013-04-27 DIAGNOSIS — I2510 Atherosclerotic heart disease of native coronary artery without angina pectoris: Secondary | ICD-10-CM

## 2013-04-27 DIAGNOSIS — I340 Nonrheumatic mitral (valve) insufficiency: Secondary | ICD-10-CM

## 2013-04-27 DIAGNOSIS — I471 Supraventricular tachycardia: Secondary | ICD-10-CM

## 2013-04-27 NOTE — Assessment & Plan Note (Addendum)
Hypotension improved after adjusting his medications. He is currently New York Heart Association class III with no evidence of fluid overload. Check CBC and basic metabolic profile today. Ejection fraction was 25%. Followup in 2 weeks from now for up titration of medications and likely addition of spironolactone.  He is asking about return to work. He can tentatively go back likely in April.

## 2013-04-27 NOTE — Patient Instructions (Addendum)
Your physician has recommended you make the following change in your medication:  Stop Naproxen   Your physician recommends that you schedule a follow-up appointment in:  2 weeks    Your physician recommends that you have lab work today: BMP  CBC  Do not return to work until 06/28/13

## 2013-04-27 NOTE — Assessment & Plan Note (Signed)
The valve appears to be normal in structure. Continue medical therapy for this.

## 2013-04-27 NOTE — Assessment & Plan Note (Signed)
I suspect that the patient had myocardial infarction in October or November with gradual decline in LV systolic function. He currently has no convincing symptoms of angina. The subtotal occlusion in mid left circumflex appears to be chronic and thus I would plan on treating him medically for now.

## 2013-04-27 NOTE — Progress Notes (Signed)
HPI  This is a pleasant 73 year old man who is here today for a close followup visit as he was noted to be hypotensive 2 days ago.  He has known history of hypertension, tobacco use and COPD. He presented recently to [facility] with progressive dyspnea, orthopnea and PND which started in October and was progressive since that time. He was initially thought to have an anxiety disorder. He quit smoking in December. He was hospitalized earlier this month at [facility] due to shortness of breath. He was initially thought of having pneumonia. However, his BNP was elevated. Chest x-ray showed fluid overload and cardiomegaly. Echocardiogram showed an ejection fraction of 25% with inferoposterior akinesis and severe mitral regurgitation with posterior jet. He improved with IV Lasix. He underwent a right and left cardiac catheterization which showed severe one-vessel coronary artery disease with a subtotal occlusion of the mid left circumflex which appeared to be chronic. TEE was performed which showed that the mitral valve was normal in structure and regurgitation was moderate after optimizing his volume status. He was discharged home on oxygen due to persistent hypoxia. Since hospital discharge, he felt significantly better with no symptoms of orthopnea or PND anymore. However, he was noted to be dizzy and lightheaded 2 days ago with significant orthostatic hypotension. I decreased the dose of lisinopril to 10 mg daily and Lasix to 20 mg once daily. He is feeling better.  he is no longer dizzy. No chest pain.   Allergies  Allergen Reactions  . Codeine     REACTION: nausea  . Statins     REACTION: maylgias     Current Outpatient Prescriptions on File Prior to Visit  Medication Sig Dispense Refill  . albuterol (PROVENTIL HFA;VENTOLIN HFA) 108 (90 BASE) MCG/ACT inhaler Inhale 2 puffs into the lungs every 6 (six) hours as needed for wheezing or shortness of breath.  1 Inhaler  2  . ALPRAZolam (XANAX) 0.25 MG tablet  TAKE ONE TABLET BY MOUTH ONCE DAILY AT BEDTIME AS NEEDED FOR SLEEP OR  ANXIETY  20 tablet  0  . aspirin 81 MG tablet Take 81 mg by mouth daily. Takes at night       . clonazePAM (KLONOPIN) 0.5 MG tablet TAKE UP TO 1.5 TABLETS BY MOUTH AT BEDTIME AS NEEDED  60 tablet  0  . ezetimibe (ZETIA) 10 MG tablet Take 1 tablet (10 mg total) by mouth daily.  90 tablet  3  . Fluticasone-Salmeterol (ADVAIR) 250-50 MCG/DOSE AEPB Inhale 1 puff into the lungs 2 (two) times daily.      . furosemide (LASIX) 20 MG tablet Take 1 tablet (20 mg total) by mouth daily.  90 tablet  3  . magnesium oxide (MAG-OX) 400 MG tablet Take 400 mg by mouth daily.      . metoprolol succinate (TOPROL-XL) 50 MG 24 hr tablet Take 1 tablet (50 mg total) by mouth daily. Take with or immediately following a meal.  90 tablet  3  . niacin (NIASPAN) 1000 MG CR tablet TAKE ONE TABLET BY MOUTH EVERY DAY AT BEDTIME  90 tablet  0  . tiotropium (SPIRIVA) 18 MCG inhalation capsule Place 18 mcg into inhaler and inhale daily.      Marland Kitchen lisinopril (PRINIVIL,ZESTRIL) 10 MG tablet Take 1 tablet (10 mg total) by mouth daily.  90 tablet  3   No current facility-administered medications on file prior to visit.     Past Medical History  Diagnosis Date  . Hyperlipidemia   .  Arthritis 03/12/2011  . Renal cancer   . Rectal cancer   . Hypertension   . CHF (congestive heart failure)   . Chronic obstructive lung disease   . Mitral regurgitation   . Coronary artery disease 03/2013    EF of 25%, moderate to severe mitral regurgitation. Catheterization showed normal cardiac output with mild pulmonary hypertension. There was 99% stenosis in the mid left circumflex which appeared to be chronic with akinetic basal inferior/posterior wall. Mild LAD and RCA disease.  . Chronic systolic heart failure 49/4496    EF of 25% with moderate to severe mitral regurgitation     Past Surgical History  Procedure Laterality Date  . Appendectomy  06/2003  . Resection of  rectum    . Hernia repair    . Colostomy    . Cataract surgery    . Cardiac catheterization  03/2013    [facility]     Family History  Problem Relation Age of Onset  . Heart disease Mother   . Heart disease Father   . Hyperlipidemia Father   . Hypertension Father   . Heart attack Brother 20    MI  . Heart disease Sister     stents placed   . Heart disease Sister     CABG     History   Social History  . Marital Status: Married    Spouse Name: N/A    Number of Children: N/A  . Years of Education: N/A   Occupational History  . Not on file.   Social History Main Topics  . Smoking status: Former Smoker -- 1.00 packs/day for 50 years    Types: Cigarettes    Quit date: 03/25/2013  . Smokeless tobacco: Never Used  . Alcohol Use: No  . Drug Use: No  . Sexual Activity: Yes   Other Topics Concern  . Not on file   Social History Narrative  . No narrative on file     PHYSICAL EXAM   BP 112/82  Pulse 92  Ht 5\' 6"  (1.676 m)  Wt 154 lb 8 oz (70.081 kg)  BMI 24.95 kg/m2 Constitutional: He is oriented to person, place, and time. He appears well-developed and well-nourished. No distress.  HENT: No nasal discharge.  Head: Normocephalic and atraumatic.  Eyes: Pupils are equal and round.  No discharge. Neck: Normal range of motion. Neck supple. No JVD present. No thyromegaly present.  Cardiovascular: Normal rate, regular rhythm, normal heart sounds. Exam reveals no gallop and no friction rub. There is a 2/6 holosystolic murmur at the apex and the left sternal border Pulmonary/Chest: Effort normal and breath sounds normal. No stridor. No respiratory distress. He has no wheezes. He has no rales. He exhibits no tenderness.  Abdominal: Soft. Bowel sounds are normal. He exhibits no distension. There is no tenderness. There is no rebound and no guarding.  Musculoskeletal: Normal range of motion. He exhibits no edema and no tenderness.  Neurological: He is alert and oriented to  person, place, and time. Coordination normal.  Skin: Skin is warm and dry. No rash noted. He is not diaphoretic. No erythema. No pallor.  Psychiatric: He has a normal mood and affect. His behavior is normal. Judgment and thought content normal.       EKG: Sinus rhythm with sinus arrhythmia. Left ventricular hypertrophy with repolarization abnormalities.   ASSESSMENT AND PLAN

## 2013-04-28 ENCOUNTER — Telehealth: Payer: Self-pay

## 2013-04-28 LAB — COMPREHENSIVE METABOLIC PANEL
ALK PHOS: 75 IU/L (ref 39–117)
ALT: 32 IU/L (ref 0–44)
AST: 27 IU/L (ref 0–40)
Albumin/Globulin Ratio: 1.9 (ref 1.1–2.5)
Albumin: 4.4 g/dL (ref 3.5–4.8)
BUN/Creatinine Ratio: 21 (ref 10–22)
BUN: 18 mg/dL (ref 8–27)
CHLORIDE: 94 mmol/L — AB (ref 97–108)
CO2: 21 mmol/L (ref 18–29)
Calcium: 9.5 mg/dL (ref 8.6–10.2)
Creatinine, Ser: 0.84 mg/dL (ref 0.76–1.27)
GFR calc Af Amer: 101 mL/min/{1.73_m2} (ref >59)
GFR calc non Af Amer: 87 mL/min/{1.73_m2} (ref >59)
Globulin, Total: 2.3 g/dL (ref 1.5–4.5)
Glucose: 56 mg/dL — ABNORMAL LOW (ref 65–99)
POTASSIUM: 5.8 mmol/L — AB (ref 3.5–5.2)
SODIUM: 131 mmol/L — AB (ref 134–144)
Total Bilirubin: 0.7 mg/dL (ref 0.0–1.2)
Total Protein: 6.7 g/dL (ref 6.0–8.5)

## 2013-04-28 LAB — CBC WITH DIFFERENTIAL
BASOS ABS: 0.1 10*3/uL (ref 0.0–0.2)
Basos: 1 %
Eos: 2 %
Eosinophils Absolute: 0.1 10*3/uL (ref 0.0–0.4)
HCT: 41.7 % (ref 37.5–51.0)
Hemoglobin: 14.1 g/dL (ref 12.6–17.7)
IMMATURE GRANS (ABS): 0 10*3/uL (ref 0.0–0.1)
IMMATURE GRANULOCYTES: 0 %
Lymphocytes Absolute: 1.6 10*3/uL (ref 0.7–3.1)
Lymphs: 27 %
MCH: 30.1 pg (ref 26.6–33.0)
MCHC: 33.8 g/dL (ref 31.5–35.7)
MCV: 89 fL (ref 79–97)
MONOCYTES: 10 %
MONOS ABS: 0.6 10*3/uL (ref 0.1–0.9)
NEUTROS PCT: 60 %
Neutrophils Absolute: 3.6 10*3/uL (ref 1.4–7.0)
PLATELETS: 291 10*3/uL (ref 150–379)
RBC: 4.69 x10E6/uL (ref 4.14–5.80)
RDW: 14.7 % (ref 12.3–15.4)
WBC: 6 10*3/uL (ref 3.4–10.8)

## 2013-04-28 NOTE — Telephone Encounter (Signed)
Pt called regarding his disability papers. States his wife brought them over a week ago.

## 2013-05-01 NOTE — Telephone Encounter (Signed)
Prior auth forms submitted. Pending notification from [employer],

## 2013-05-01 NOTE — Telephone Encounter (Signed)
Patient informed that his paperwork is at front desk. Patient will pick up today.

## 2013-05-01 NOTE — Telephone Encounter (Signed)
Completed and in my box.

## 2013-05-03 ENCOUNTER — Telehealth: Payer: Self-pay | Admitting: Family Medicine

## 2013-05-03 NOTE — Telephone Encounter (Signed)
Relevant patient education mailed to patient.  

## 2013-05-04 ENCOUNTER — Telehealth: Payer: Self-pay | Admitting: *Deleted

## 2013-05-04 ENCOUNTER — Other Ambulatory Visit: Payer: Self-pay | Admitting: *Deleted

## 2013-05-04 DIAGNOSIS — I5022 Chronic systolic (congestive) heart failure: Secondary | ICD-10-CM

## 2013-05-04 NOTE — Telephone Encounter (Signed)
Faxed "delay in therapy" form to [facility] 05/04/13

## 2013-05-11 ENCOUNTER — Ambulatory Visit (INDEPENDENT_AMBULATORY_CARE_PROVIDER_SITE_OTHER): Payer: BC Managed Care – PPO | Admitting: Cardiovascular Disease

## 2013-05-11 ENCOUNTER — Encounter: Payer: Self-pay | Admitting: Cardiovascular Disease

## 2013-05-11 VITALS — BP 82/58 | HR 78 | Ht 66.0 in | Wt 152.0 lb

## 2013-05-11 DIAGNOSIS — I5022 Chronic systolic (congestive) heart failure: Secondary | ICD-10-CM | POA: Diagnosis not present

## 2013-05-11 DIAGNOSIS — I2510 Atherosclerotic heart disease of native coronary artery without angina pectoris: Secondary | ICD-10-CM | POA: Diagnosis not present

## 2013-05-11 DIAGNOSIS — E785 Hyperlipidemia, unspecified: Secondary | ICD-10-CM | POA: Diagnosis not present

## 2013-05-11 DIAGNOSIS — I340 Nonrheumatic mitral (valve) insufficiency: Secondary | ICD-10-CM

## 2013-05-11 DIAGNOSIS — R0602 Shortness of breath: Secondary | ICD-10-CM

## 2013-05-11 DIAGNOSIS — I059 Rheumatic mitral valve disease, unspecified: Secondary | ICD-10-CM

## 2013-05-11 MED ORDER — MAGNESIUM OXIDE 400 MG PO TABS: 400.0000 mg | ORAL_TABLET | Freq: Every day | ORAL | Status: DC

## 2013-05-11 NOTE — Progress Notes (Signed)
HPI  This is a pleasant 73 year old man who is here today for a  followup visit regarding chronic systolic heart failure and mitral regurgitation.  He has known history of hypertension, tobacco use and COPD.   He was hospitalized in January of 2015 for congestive heart failure. Echocardiogram in 03/2013 showed an ejection fraction of 25% with inferoposterior akinesis and severe mitral regurgitation with posterior jet.  A right and left cardiac catheterization showed severe one-vessel coronary artery disease with a subtotal occlusion of the mid left circumflex which appeared to be chronic. TEE was performed which showed that the mitral valve was normal in structure and regurgitation was moderate after optimizing his volume status.  Since hospital discharge, he felt significantly better with no symptoms of orthopnea or PND anymore. However, he was noted to be dizzy and lightheaded with significant orthostatic hypotension. Thus, the dose of lisinopril and Lasix were both decreased. He has been doing reasonably well and denies dizziness or syncope. No chest pain. Dyspnea is gradually improving. He is no longer having orthopnea or PND. He does wake up 2 times at night to urinate.  Allergies  Allergen Reactions  . Codeine     REACTION: nausea  . Statins     REACTION: maylgias     Current Outpatient Prescriptions on File Prior to Visit  Medication Sig Dispense Refill  . albuterol (PROVENTIL HFA;VENTOLIN HFA) 108 (90 BASE) MCG/ACT inhaler Inhale 2 puffs into the lungs every 6 (six) hours as needed for wheezing or shortness of breath.  1 Inhaler  2  . ALPRAZolam (XANAX) 0.25 MG tablet TAKE ONE TABLET BY MOUTH ONCE DAILY AT BEDTIME AS NEEDED FOR SLEEP OR  ANXIETY  20 tablet  0  . aspirin 81 MG tablet Take 81 mg by mouth daily. Takes at night       . clonazePAM (KLONOPIN) 0.5 MG tablet TAKE UP TO 1.5 TABLETS BY MOUTH AT BEDTIME AS NEEDED  60 tablet  0  . ezetimibe (ZETIA) 10 MG tablet Take 1 tablet  (10 mg total) by mouth daily.  90 tablet  3  . Fluticasone-Salmeterol (ADVAIR) 250-50 MCG/DOSE AEPB Inhale 1 puff into the lungs as needed.       . furosemide (LASIX) 20 MG tablet Take 1 tablet (20 mg total) by mouth daily.  90 tablet  3  . lisinopril (PRINIVIL,ZESTRIL) 10 MG tablet Take 1 tablet (10 mg total) by mouth daily.  90 tablet  3  . metoprolol succinate (TOPROL-XL) 50 MG 24 hr tablet Take 1 tablet (50 mg total) by mouth daily. Take with or immediately following a meal.  90 tablet  3  . niacin (NIASPAN) 1000 MG CR tablet TAKE ONE TABLET BY MOUTH EVERY DAY AT BEDTIME  90 tablet  0  . tiotropium (SPIRIVA) 18 MCG inhalation capsule Place 18 mcg into inhaler and inhale daily.       No current facility-administered medications on file prior to visit.     Past Medical History  Diagnosis Date  . Hyperlipidemia   . Arthritis 03/12/2011  . Renal cancer   . Rectal cancer   . Hypertension   . CHF (congestive heart failure)   . Chronic obstructive lung disease   . Mitral regurgitation   . Coronary artery disease 03/2013    EF of 25%, moderate to severe mitral regurgitation. Catheterization showed normal cardiac output with mild pulmonary hypertension. There was 99% stenosis in the mid left circumflex which appeared to be chronic with akinetic  basal inferior/posterior wall. Mild LAD and RCA disease.  . Chronic systolic heart failure 10/221    EF of 25% with moderate to severe mitral regurgitation     Past Surgical History  Procedure Laterality Date  . Appendectomy  06/2003  . Resection of rectum    . Hernia repair    . Colostomy    . Cataract surgery    . Cardiac catheterization  03/2013    [facility]     Family History  Problem Relation Age of Onset  . Heart disease Mother   . Heart disease Father   . Hyperlipidemia Father   . Hypertension Father   . Heart attack Brother 25    MI  . Heart disease Sister     stents placed   . Heart disease Sister     CABG     History    Social History  . Marital Status: Married    Spouse Name: N/A    Number of Children: N/A  . Years of Education: N/A   Occupational History  . Not on file.   Social History Main Topics  . Smoking status: Former Smoker -- 1.00 packs/day for 50 years    Types: Cigarettes    Quit date: 03/25/2013  . Smokeless tobacco: Never Used  . Alcohol Use: No  . Drug Use: No  . Sexual Activity: Yes   Other Topics Concern  . Not on file   Social History Narrative  . No narrative on file     PHYSICAL EXAM   BP 82/58  Pulse 78  Ht 5\' 6"  (1.676 m)  Wt 152 lb (68.947 kg)  BMI 24.55 kg/m2 Constitutional: He is oriented to person, place, and time. He appears well-developed and well-nourished. No distress.  HENT: No nasal discharge.  Head: Normocephalic and atraumatic.  Eyes: Pupils are equal and round.  No discharge. Neck: Normal range of motion. Neck supple. No JVD present. No thyromegaly present.  Cardiovascular: Normal rate, regular rhythm, normal heart sounds. Exam reveals no gallop and no friction rub. There is a 2/6 holosystolic murmur at the apex and the left sternal border Pulmonary/Chest: Effort normal and breath sounds normal. No stridor. No respiratory distress. He has no wheezes. He has no rales. He exhibits no tenderness.  Abdominal: Soft. Bowel sounds are normal. He exhibits no distension. There is no tenderness. There is no rebound and no guarding.  Musculoskeletal: Normal range of motion. He exhibits no edema and no tenderness.  Neurological: He is alert and oriented to person, place, and time. Coordination normal.  Skin: Skin is warm and dry. No rash noted. He is not diaphoretic. No erythema. No pallor.  Psychiatric: He has a normal mood and affect. His behavior is normal. Judgment and thought content normal.        ASSESSMENT AND PLAN

## 2013-05-11 NOTE — Patient Instructions (Signed)
Your physician recommends that you have lab work today: Basic metabolic panel   Your physician recommends that you continue on your current medications as directed. Please refer to the Current Medication list given to you today.   Your physician has requested that you have an echocardiogram in 2 months. Echocardiography is a painless test that uses sound waves to create images of your heart. It provides your doctor with information about the size and shape of your heart and how well your heart's chambers and valves are working. This procedure takes approximately one hour. There are no restrictions for this procedure.  Your physician recommends that you schedule a follow-up appointment in:  1 week after your echo

## 2013-05-12 ENCOUNTER — Other Ambulatory Visit: Payer: Self-pay | Admitting: *Deleted

## 2013-05-12 DIAGNOSIS — I5022 Chronic systolic (congestive) heart failure: Secondary | ICD-10-CM

## 2013-05-12 LAB — BASIC METABOLIC PANEL
BUN / CREAT RATIO: 17 (ref 10–22)
BUN: 19 mg/dL (ref 8–27)
CO2: 23 mmol/L (ref 18–29)
CREATININE: 1.11 mg/dL (ref 0.76–1.27)
Calcium: 9.7 mg/dL (ref 8.6–10.2)
Chloride: 96 mmol/L — ABNORMAL LOW (ref 97–108)
GFR calc Af Amer: 76 mL/min/{1.73_m2} (ref >59)
GFR calc non Af Amer: 66 mL/min/{1.73_m2} (ref >59)
Glucose: 69 mg/dL (ref 65–99)
Potassium: 5.6 mmol/L — ABNORMAL HIGH (ref 3.5–5.2)
SODIUM: 133 mmol/L — AB (ref 134–144)

## 2013-05-12 NOTE — Assessment & Plan Note (Signed)
He has no symptoms of angina. Continue medical therapy.

## 2013-05-12 NOTE — Assessment & Plan Note (Signed)
He appears to be euvolemic. Continue current dose of Lasix. Check basic metabolic profile today. He is having hyperkalemia and blood pressure is low. Thus, spironolactone is contraindicated. Continue treatment with Toprol and lisinopril. Obtain an echocardiogram in 2 months to evaluate LV systolic function. If EF remains less than 35%, consider an ICD placement.

## 2013-05-12 NOTE — Assessment & Plan Note (Signed)
He is intolerant to statins and currently is on niacin and Zetia

## 2013-05-12 NOTE — Assessment & Plan Note (Signed)
This was moderate in severity.

## 2013-05-26 ENCOUNTER — Other Ambulatory Visit: Payer: Medicare Other

## 2013-05-26 ENCOUNTER — Ambulatory Visit (INDEPENDENT_AMBULATORY_CARE_PROVIDER_SITE_OTHER): Payer: BC Managed Care – PPO | Admitting: *Deleted

## 2013-05-26 DIAGNOSIS — I5022 Chronic systolic (congestive) heart failure: Secondary | ICD-10-CM | POA: Diagnosis not present

## 2013-05-27 LAB — BASIC METABOLIC PANEL
BUN / CREAT RATIO: 16 (ref 10–22)
BUN: 14 mg/dL (ref 8–27)
CO2: 23 mmol/L (ref 18–29)
CREATININE: 0.87 mg/dL (ref 0.76–1.27)
Calcium: 9.4 mg/dL (ref 8.6–10.2)
Chloride: 94 mmol/L — ABNORMAL LOW (ref 97–108)
GFR calc non Af Amer: 86 mL/min/{1.73_m2} (ref >59)
GFR, EST AFRICAN AMERICAN: 100 mL/min/{1.73_m2} (ref >59)
Glucose: 131 mg/dL — ABNORMAL HIGH (ref 65–99)
Potassium: 5.5 mmol/L — ABNORMAL HIGH (ref 3.5–5.2)
Sodium: 133 mmol/L — ABNORMAL LOW (ref 134–144)

## 2013-05-29 ENCOUNTER — Other Ambulatory Visit: Payer: Self-pay | Admitting: *Deleted

## 2013-05-29 MED ORDER — NIACIN ER (ANTIHYPERLIPIDEMIC) 1000 MG PO TBCR: EXTENDED_RELEASE_TABLET | ORAL | Status: DC

## 2013-06-22 ENCOUNTER — Telehealth: Payer: Self-pay

## 2013-06-22 NOTE — Telephone Encounter (Signed)
Pt wife called asking if it is ok if pt takes Aleve for shoulder/ arm pain? Please call,.

## 2013-06-22 NOTE — Telephone Encounter (Signed)
Informed patient that per Dr. Fletcher Anon "He should avoid all NSAIDs including Aleve due to congestive heart failure.  He can use acetaminophen (Tylenol) as needed". Patient and wife verbalized understanding.

## 2013-06-22 NOTE — Telephone Encounter (Signed)
He should avoid all NSAIDs including Aleve due to congestive heart failure. He can use acetaminophen (Tylenol) as needed

## 2013-06-26 ENCOUNTER — Other Ambulatory Visit: Payer: Self-pay | Admitting: *Deleted

## 2013-06-26 MED ORDER — CLONAZEPAM 0.5 MG PO TABS: ORAL_TABLET | ORAL | Status: DC

## 2013-06-26 NOTE — Telephone Encounter (Signed)
Pharmacy last filled # [phone] on 06/08/13.

## 2013-06-27 NOTE — Telephone Encounter (Signed)
Spoke to pts wife and informed her Rx has been called in to requested pharmacy

## 2013-07-04 DIAGNOSIS — I350 Nonrheumatic aortic (valve) stenosis: Secondary | ICD-10-CM

## 2013-07-04 HISTORY — DX: Nonrheumatic aortic (valve) stenosis: I35.0

## 2013-07-11 ENCOUNTER — Other Ambulatory Visit: Payer: Medicare Other

## 2013-07-14 ENCOUNTER — Other Ambulatory Visit: Payer: Self-pay

## 2013-07-14 ENCOUNTER — Other Ambulatory Visit (INDEPENDENT_AMBULATORY_CARE_PROVIDER_SITE_OTHER): Payer: BC Managed Care – PPO

## 2013-07-14 DIAGNOSIS — I059 Rheumatic mitral valve disease, unspecified: Secondary | ICD-10-CM | POA: Diagnosis not present

## 2013-07-14 DIAGNOSIS — I2510 Atherosclerotic heart disease of native coronary artery without angina pectoris: Secondary | ICD-10-CM | POA: Diagnosis not present

## 2013-07-14 DIAGNOSIS — R0602 Shortness of breath: Secondary | ICD-10-CM

## 2013-07-14 DIAGNOSIS — I5022 Chronic systolic (congestive) heart failure: Secondary | ICD-10-CM

## 2013-07-14 DIAGNOSIS — I509 Heart failure, unspecified: Secondary | ICD-10-CM | POA: Diagnosis not present

## 2013-07-20 ENCOUNTER — Ambulatory Visit: Payer: BC Managed Care – PPO | Admitting: Cardiovascular Disease

## 2013-07-20 ENCOUNTER — Encounter: Payer: Self-pay | Admitting: Cardiovascular Disease

## 2013-07-20 ENCOUNTER — Ambulatory Visit (INDEPENDENT_AMBULATORY_CARE_PROVIDER_SITE_OTHER): Payer: BC Managed Care – PPO | Admitting: Cardiovascular Disease

## 2013-07-20 VITALS — BP 136/80 | HR 75 | Ht 66.0 in | Wt 166.8 lb

## 2013-07-20 DIAGNOSIS — I498 Other specified cardiac arrhythmias: Secondary | ICD-10-CM | POA: Diagnosis not present

## 2013-07-20 DIAGNOSIS — E785 Hyperlipidemia, unspecified: Secondary | ICD-10-CM

## 2013-07-20 DIAGNOSIS — I2510 Atherosclerotic heart disease of native coronary artery without angina pectoris: Secondary | ICD-10-CM | POA: Diagnosis not present

## 2013-07-20 DIAGNOSIS — I5022 Chronic systolic (congestive) heart failure: Secondary | ICD-10-CM

## 2013-07-20 DIAGNOSIS — I471 Supraventricular tachycardia: Secondary | ICD-10-CM

## 2013-07-20 DIAGNOSIS — I340 Nonrheumatic mitral (valve) insufficiency: Secondary | ICD-10-CM

## 2013-07-20 DIAGNOSIS — I059 Rheumatic mitral valve disease, unspecified: Secondary | ICD-10-CM

## 2013-07-20 MED ORDER — METOPROLOL SUCCINATE ER 100 MG PO TB24
100.0000 mg | ORAL_TABLET | Freq: Every day | ORAL | Status: DC
Start: 2013-07-20 — End: 2014-04-24

## 2013-07-20 NOTE — Patient Instructions (Signed)
Your physician has recommended you make the following change in your medication:  Increase Toprol to 100 mg daily  Your physician recommends that you schedule a follow-up appointment in:  3 months

## 2013-07-23 ENCOUNTER — Encounter: Payer: Self-pay | Admitting: Cardiovascular Disease

## 2013-07-23 NOTE — Assessment & Plan Note (Signed)
This was moderate on recent echocardiogram.

## 2013-07-23 NOTE — Assessment & Plan Note (Signed)
He denies symptoms of angina. Continue medical therapy.

## 2013-07-23 NOTE — Assessment & Plan Note (Signed)
He is intolerant to statins and currently is on niacin and Zetia

## 2013-07-23 NOTE — Assessment & Plan Note (Signed)
Recent echocardiogram showed improvement in ejection fraction to 30-35%. He appears to be euvolemic on small dose Lasix. He is currently on optimal medications. I could not increase the dose of lisinopril or add spironolactone due to hyperkalemia. I increased the dose of Toprol to 100 mg once daily today. Will need basic metabolic profile upon followup. I discussed the indications for an ICD placement. He prefers to continue with medical therapy for now. QRS is not wide enough to benefit from CRT.

## 2013-07-23 NOTE — Progress Notes (Signed)
HPI  This is a pleasant 73 year old man who is here today for a  followup visit regarding chronic systolic heart failure and mitral regurgitation.  He has known history of hypertension, tobacco use and COPD.   He was hospitalized in January of 2015 for congestive heart failure. Echocardiogram in 03/2013 showed an ejection fraction of 25% with inferoposterior akinesis and severe mitral regurgitation with posterior jet.  A right and left cardiac catheterization showed severe one-vessel coronary artery disease with a subtotal occlusion of the mid left circumflex which appeared to be chronic. TEE was performed which showed that the mitral valve was normal in structure and regurgitation was moderate after optimizing his volume status.  He has been doing well and reports significant improvement in dyspnea. He denies any further orthopnea or PND. Recent echocardiogram this month showed an ejection fraction of 30-35%, mild aortic stenosis and moderate mitral regurgitation.  Allergies  Allergen Reactions  . Codeine     REACTION: nausea  . Statins     REACTION: maylgias     Current Outpatient Prescriptions on File Prior to Visit  Medication Sig Dispense Refill  . albuterol (PROVENTIL HFA;VENTOLIN HFA) 108 (90 BASE) MCG/ACT inhaler Inhale 2 puffs into the lungs every 6 (six) hours as needed for wheezing or shortness of breath.  1 Inhaler  2  . ALPRAZolam (XANAX) 0.25 MG tablet TAKE ONE TABLET BY MOUTH ONCE DAILY AT BEDTIME AS NEEDED FOR SLEEP OR  ANXIETY  20 tablet  0  . aspirin 81 MG tablet Take 81 mg by mouth daily. Takes at night       . clonazePAM (KLONOPIN) 0.5 MG tablet TAKE UP TO 1.5 TABLETS BY MOUTH AT BEDTIME AS NEEDED  60 tablet  0  . ezetimibe (ZETIA) 10 MG tablet Take 1 tablet (10 mg total) by mouth daily.  90 tablet  3  . Fluticasone-Salmeterol (ADVAIR) 250-50 MCG/DOSE AEPB Inhale 1 puff into the lungs as needed.       . furosemide (LASIX) 20 MG tablet Take 1 tablet (20 mg total) by  mouth daily.  90 tablet  3  . lisinopril (PRINIVIL,ZESTRIL) 10 MG tablet Take 1 tablet (10 mg total) by mouth daily.  90 tablet  3  . magnesium oxide (MAG-OX) 400 MG tablet Take 1 tablet (400 mg total) by mouth daily.  90 tablet  3  . niacin (NIASPAN) 1000 MG CR tablet TAKE ONE TABLET BY MOUTH EVERY DAY AT BEDTIME  90 tablet  0  . tiotropium (SPIRIVA) 18 MCG inhalation capsule Place 18 mcg into inhaler and inhale daily.       No current facility-administered medications on file prior to visit.     Past Medical History  Diagnosis Date  . Hyperlipidemia   . Arthritis 03/12/2011  . Renal cancer   . Rectal cancer   . Hypertension   . CHF (congestive heart failure)   . Chronic obstructive lung disease   . Mitral regurgitation   . Coronary artery disease 03/2013    EF of 25%, moderate to severe mitral regurgitation. Catheterization showed normal cardiac output with mild pulmonary hypertension. There was 99% stenosis in the mid left circumflex which appeared to be chronic with akinetic basal inferior/posterior wall. Mild LAD and RCA disease.  . Chronic systolic heart failure 72/5366    EF of 25% with moderate to severe mitral regurgitation     Past Surgical History  Procedure Laterality Date  . Appendectomy  06/2003  . Resection of  rectum    . Hernia repair    . Colostomy    . Cataract surgery    . Cardiac catheterization  03/2013    [facility]     Family History  Problem Relation Age of Onset  . Heart disease Mother   . Heart disease Father   . Hyperlipidemia Father   . Hypertension Father   . Heart attack Brother 89    MI  . Heart disease Sister     stents placed   . Heart disease Sister     CABG     History   Social History  . Marital Status: Married    Spouse Name: N/A    Number of Children: N/A  . Years of Education: N/A   Occupational History  . Not on file.   Social History Main Topics  . Smoking status: Former Smoker -- 1.00 packs/day for 50 years     Types: Cigarettes    Quit date: 03/25/2013  . Smokeless tobacco: Never Used  . Alcohol Use: No  . Drug Use: No  . Sexual Activity: Yes   Other Topics Concern  . Not on file   Social History Narrative  . No narrative on file     PHYSICAL EXAM   BP 136/80  Pulse 75  Ht 5\' 6"  (1.676 m)  Wt 166 lb 12 oz (75.637 kg)  BMI 26.93 kg/m2 Constitutional: He is oriented to person, place, and time. He appears well-developed and well-nourished. No distress.  HENT: No nasal discharge.  Head: Normocephalic and atraumatic.  Eyes: Pupils are equal and round.  No discharge. Neck: Normal range of motion. Neck supple. No JVD present. No thyromegaly present.  Cardiovascular: Normal rate, regular rhythm with premature beats, normal heart sounds. Exam reveals no gallop and no friction rub. There is a 2/6 holosystolic murmur at the apex and the left sternal border Pulmonary/Chest: Effort normal and breath sounds normal. No stridor. No respiratory distress. He has no wheezes. He has no rales. He exhibits no tenderness.  Abdominal: Soft. Bowel sounds are normal. He exhibits no distension. There is no tenderness. There is no rebound and no guarding.  Musculoskeletal: Normal range of motion. He exhibits no edema and no tenderness.  Neurological: He is alert and oriented to person, place, and time. Coordination normal.  Skin: Skin is warm and dry. No rash noted. He is not diaphoretic. No erythema. No pallor.  Psychiatric: He has a normal mood and affect. His behavior is normal. Judgment and thought content normal.     EKG: Sinus rhythm with frequent PACs.    ASSESSMENT AND PLAN

## 2013-08-29 ENCOUNTER — Other Ambulatory Visit: Payer: Self-pay | Admitting: *Deleted

## 2013-08-29 MED ORDER — NIACIN ER (ANTIHYPERLIPIDEMIC) 1000 MG PO TBCR: EXTENDED_RELEASE_TABLET | ORAL | Status: DC

## 2013-08-29 MED ORDER — CLONAZEPAM 0.5 MG PO TABS: ORAL_TABLET | ORAL | Status: DC

## 2013-08-29 NOTE — Telephone Encounter (Signed)
Lm on pts vm and informed him Rx has been sent to requested pharmacy

## 2013-08-29 NOTE — Telephone Encounter (Signed)
Last OV 04/21/13

## 2013-10-09 DIAGNOSIS — M25539 Pain in unspecified wrist: Secondary | ICD-10-CM | POA: Diagnosis not present

## 2013-10-11 ENCOUNTER — Ambulatory Visit: Payer: BC Managed Care – PPO | Admitting: Family Medicine

## 2013-10-13 ENCOUNTER — Other Ambulatory Visit: Payer: Self-pay | Admitting: *Deleted

## 2013-10-13 NOTE — Telephone Encounter (Signed)
Pt requesting medication refill. Last f/u appt 03/2013 with no future appts scheduled. pls advise

## 2013-10-16 MED ORDER — CLONAZEPAM 0.5 MG PO TABS: ORAL_TABLET | ORAL | Status: DC

## 2013-10-16 NOTE — Telephone Encounter (Signed)
Spoke to pt and informed him Rx has been called in to requested pharmacy

## 2013-10-18 ENCOUNTER — Ambulatory Visit (INDEPENDENT_AMBULATORY_CARE_PROVIDER_SITE_OTHER): Payer: BC Managed Care – PPO | Admitting: Family Medicine

## 2013-10-18 ENCOUNTER — Encounter: Payer: Self-pay | Admitting: Family Medicine

## 2013-10-18 VITALS — BP 112/82 | HR 78 | Temp 98.6°F | Ht 66.0 in | Wt 173.5 lb

## 2013-10-18 DIAGNOSIS — I429 Cardiomyopathy, unspecified: Secondary | ICD-10-CM

## 2013-10-18 DIAGNOSIS — I428 Other cardiomyopathies: Secondary | ICD-10-CM | POA: Diagnosis not present

## 2013-10-18 DIAGNOSIS — M1710 Unilateral primary osteoarthritis, unspecified knee: Secondary | ICD-10-CM

## 2013-10-18 DIAGNOSIS — M19039 Primary osteoarthritis, unspecified wrist: Secondary | ICD-10-CM | POA: Diagnosis not present

## 2013-10-18 DIAGNOSIS — I2510 Atherosclerotic heart disease of native coronary artery without angina pectoris: Secondary | ICD-10-CM

## 2013-10-18 DIAGNOSIS — M19032 Primary osteoarthritis, left wrist: Secondary | ICD-10-CM

## 2013-10-18 DIAGNOSIS — I509 Heart failure, unspecified: Secondary | ICD-10-CM

## 2013-10-18 DIAGNOSIS — M1711 Unilateral primary osteoarthritis, right knee: Secondary | ICD-10-CM

## 2013-10-18 NOTE — Progress Notes (Signed)
[facility] [id number] Phone: [phone] Fax: [phone]  Patient ID: Curtis Campos MRN: [id number], DOB: 1941/01/18, 73 y.o. Date of Encounter: 10/18/2013  Primary Physician:  Arnette Norris, MD   Chief Complaint: Wrist Pain and Knee Pain   Subjective:   History of Present Illness:  Curtis Campos is a 73 y.o. very pleasant male patient who presents with the following:  Pleasant gentleman with a number of medical problems including severe congestive heart failure who presents with relatively acute left wrist pain has been ongoing for the last 3 or 4 weeks, and ongoing right knee pain that is intermittently been a problem for years.  Left wrist, went to Dr. Archie Endo office. It sounds as if he saw one of the PAs at their office, and I am not sure what sort of recommendation I gave to him, but he does have some Voltaren gel at home. It is intermittently been bothering him and mostly been bothering him in the last 1-2 weeks. Work him up some last night. Last week. No injury.  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  L wrist OA. Old fx.  Right knee. This is been an ongoing problem for years. He does have some significant crepitus and loss of motion in typical planes of motion. Right now, he says this is the primary thing is hurting him. No trauma or injury.  Inj R knee.   Past Medical History, Surgical History, Social History, Family History, Problem List, Medications, and Allergies have been reviewed and updated if relevant.  Review of Systems:  GEN: No fevers, chills. Nontoxic. Primarily MSK c/o today. MSK: Detailed in the HPI GI: tolerating PO intake without difficulty Neuro: No numbness, parasthesias, or tingling associated. Otherwise the pertinent positives of the ROS are noted above.   Objective:   Physical Examination: BP 112/82  Pulse 78  Temp(Src) 98.6 F (37 C) (Oral)  Ht 5\' 6"  (1.676 m)  Wt 173 lb 8 oz (78.699 kg)  BMI 28.02 kg/m2   GEN: WDWN,  NAD, Non-toxic, Alert & Oriented x 3 HEENT: Atraumatic, Normocephalic.  Ears and Nose: No external deformity. EXTR: No clubbing/cyanosis/edema NEURO: Normal gait.  PSYCH: Normally interactive. Conversant. Not depressed or anxious appearing.  Calm demeanor.    Right knee lacks 3 of extension and flexion is to 110. There is no effusion. Mild tenderness on the medial and lateral joint lines. Stable to varus and valgus stress. Anterior cruciate ligament and PCL are intact. McMurray's and bounce home test both cause significant pain.  Left wrist: Entirety of the hand and fingers are nontender. States test is nontender. Nontender in the anatomical snuff box. Nontender at the hook of the hamate. The flexion and extension of the wrist is limited and he lacks approximately 25 of motion in each to direction. Ulnar and radial deviation also cause pain.   There is some fullness in the true wrist dorsally.  Radiology: No results found.  Assessment & Plan:   Primary osteoarthritis of right knee  Primary osteoarthritis of left wrist  CHF with cardiomyopathy  Clinically consistent with osteoarthritis on both accounts. His right knee is hurting him more now.  I am to place his left wrist in a cockup wrist splint, full forearm in length. He is to use this at night, and also recommended ice otherwise routinely. He can also start using Voltaren gel 3-4 times daily.  Where did inject his right knee.  If his symptoms persist in the left, that it  would be reasonable to do an intra-articular injection of the left wrist also.  Knee Injection, RIGHT Patient verbally consented to procedure. Risks (including potential rare risk of infection), benefits, and alternatives explained. Sterilely prepped with Chloraprep. Ethyl cholride used for anesthesia. 8 cc Lidocaine 1% mixed with Depo-Medrol 80 mg injected using the anteromedial approach without difficulty. No complications with procedure and tolerated well.  Patient had decreased pain post-injection.   New Prescriptions   No medications on file   Modified Medications   No medications on file   No orders of the defined types were placed in this encounter.   Follow-up: No Follow-up on file. Unless noted above, the patient is to follow-up if symptoms worsen. Red flags were reviewed with the patient.  Signed,  Maud Deed. Alycen Mack, MD, [facility] Sports Medicine   Discontinued Medications   FLUTICASONE-SALMETEROL (ADVAIR) 250-50 MCG/DOSE AEPB    Inhale 1 puff into the lungs as needed.    TIOTROPIUM (SPIRIVA) 18 MCG INHALATION CAPSULE    Place 18 mcg into inhaler and inhale daily.   Current Medications at Discharge:   Medication List       This list is accurate as of: 10/18/13  6:06 PM.  Always use your most recent med list.               albuterol 108 (90 BASE) MCG/ACT inhaler  Commonly known as:  PROVENTIL HFA;VENTOLIN HFA  Inhale 2 puffs into the lungs every 6 (six) hours as needed for wheezing or shortness of breath.     ALPRAZolam 0.25 MG tablet  Commonly known as:  XANAX  TAKE ONE TABLET BY MOUTH ONCE DAILY AT BEDTIME AS NEEDED FOR SLEEP OR  ANXIETY     aspirin 81 MG tablet  Take 81 mg by mouth daily. Takes at night     clonazePAM 0.5 MG tablet  Commonly known as:  KLONOPIN  TAKE UP TO 1.5 TABLETS BY MOUTH AT BEDTIME AS NEEDED     ezetimibe 10 MG tablet  Commonly known as:  ZETIA  Take 1 tablet (10 mg total) by mouth daily.     furosemide 20 MG tablet  Commonly known as:  LASIX  Take 1 tablet (20 mg total) by mouth daily.     lisinopril 10 MG tablet  Commonly known as:  PRINIVIL,ZESTRIL  Take 1 tablet (10 mg total) by mouth daily.     magnesium oxide 400 MG tablet  Commonly known as:  MAG-OX  Take 1 tablet (400 mg total) by mouth daily.     metoprolol succinate 100 MG 24 hr tablet  Commonly known as:  TOPROL-XL  Take 1 tablet (100 mg total) by mouth daily. Take with or immediately following a meal.     niacin  1000 MG CR tablet  Commonly known as:  NIASPAN  TAKE ONE TABLET BY MOUTH EVERY DAY AT BEDTIME

## 2013-10-18 NOTE — Progress Notes (Signed)
Pre visit review using our clinic review tool, if applicable. No additional management support is needed unless otherwise documented below in the visit note. 

## 2013-10-26 ENCOUNTER — Ambulatory Visit (INDEPENDENT_AMBULATORY_CARE_PROVIDER_SITE_OTHER): Payer: BC Managed Care – PPO | Admitting: Cardiovascular Disease

## 2013-10-26 ENCOUNTER — Encounter: Payer: Self-pay | Admitting: Cardiovascular Disease

## 2013-10-26 VITALS — BP 128/98 | HR 78 | Ht 66.0 in | Wt 170.2 lb

## 2013-10-26 DIAGNOSIS — I340 Nonrheumatic mitral (valve) insufficiency: Secondary | ICD-10-CM

## 2013-10-26 DIAGNOSIS — I498 Other specified cardiac arrhythmias: Secondary | ICD-10-CM | POA: Diagnosis not present

## 2013-10-26 DIAGNOSIS — E785 Hyperlipidemia, unspecified: Secondary | ICD-10-CM | POA: Diagnosis not present

## 2013-10-26 DIAGNOSIS — I2510 Atherosclerotic heart disease of native coronary artery without angina pectoris: Secondary | ICD-10-CM

## 2013-10-26 DIAGNOSIS — I5022 Chronic systolic (congestive) heart failure: Secondary | ICD-10-CM | POA: Diagnosis not present

## 2013-10-26 DIAGNOSIS — I25119 Atherosclerotic heart disease of native coronary artery with unspecified angina pectoris: Secondary | ICD-10-CM

## 2013-10-26 DIAGNOSIS — I059 Rheumatic mitral valve disease, unspecified: Secondary | ICD-10-CM

## 2013-10-26 DIAGNOSIS — I209 Angina pectoris, unspecified: Secondary | ICD-10-CM

## 2013-10-26 DIAGNOSIS — I471 Supraventricular tachycardia: Secondary | ICD-10-CM

## 2013-10-26 NOTE — Progress Notes (Signed)
HPI  This is a pleasant 73 year old man who is here today for a  followup visit regarding chronic systolic heart failure and mitral regurgitation.  He has known history of hypertension, tobacco use and COPD.   He was hospitalized in January of 2015 for congestive heart failure. Echocardiogram in 03/2013 showed an ejection fraction of 25% with inferoposterior akinesis and severe mitral regurgitation with posterior jet.  A right and left cardiac catheterization showed severe one-vessel coronary artery disease with a subtotal occlusion of the mid left circumflex which appeared to be chronic. TEE was performed which showed that the mitral valve was normal in structure and regurgitation was moderate after optimizing his volume status.  He has been doing well and reports significant improvement in dyspnea. He denies any further orthopnea or PND. Repeat echocardiogram in April showed an ejection fraction of 30-35%, mild aortic stenosis and moderate mitral regurgitation. He was not interested ICD placement. He has been doing well and denies any chest pain or significant dyspnea. No orthopnea or PND.  Allergies  Allergen Reactions  . Codeine     REACTION: nausea  . Statins     REACTION: maylgias     Current Outpatient Prescriptions on File Prior to Visit  Medication Sig Dispense Refill  . albuterol (PROVENTIL HFA;VENTOLIN HFA) 108 (90 BASE) MCG/ACT inhaler Inhale 2 puffs into the lungs every 6 (six) hours as needed for wheezing or shortness of breath.  1 Inhaler  2  . aspirin 81 MG tablet Take 81 mg by mouth daily. Takes at night       . clonazePAM (KLONOPIN) 0.5 MG tablet TAKE UP TO 1.5 TABLETS BY MOUTH AT BEDTIME AS NEEDED  60 tablet  0  . ezetimibe (ZETIA) 10 MG tablet Take 1 tablet (10 mg total) by mouth daily.  90 tablet  3  . furosemide (LASIX) 20 MG tablet Take 1 tablet (20 mg total) by mouth daily.  90 tablet  3  . lisinopril (PRINIVIL,ZESTRIL) 10 MG tablet Take 1 tablet (10 mg total)  by mouth daily.  90 tablet  3  . magnesium oxide (MAG-OX) 400 MG tablet Take 1 tablet (400 mg total) by mouth daily.  90 tablet  3  . metoprolol succinate (TOPROL-XL) 100 MG 24 hr tablet Take 1 tablet (100 mg total) by mouth daily. Take with or immediately following a meal.  90 tablet  3  . niacin (NIASPAN) 1000 MG CR tablet TAKE ONE TABLET BY MOUTH EVERY DAY AT BEDTIME  90 tablet  0   No current facility-administered medications on file prior to visit.     Past Medical History  Diagnosis Date  . Hyperlipidemia   . Arthritis 03/12/2011  . Renal cancer   . Rectal cancer   . Hypertension   . CHF (congestive heart failure)   . Chronic obstructive lung disease   . Mitral regurgitation   . Coronary artery disease 03/2013    EF of 25%, moderate to severe mitral regurgitation. Catheterization showed normal cardiac output with mild pulmonary hypertension. There was 99% stenosis in the mid left circumflex which appeared to be chronic with akinetic basal inferior/posterior wall. Mild LAD and RCA disease.  . Chronic systolic heart failure 57/2620    EF of 25% with moderate to severe mitral regurgitation     Past Surgical History  Procedure Laterality Date  . Appendectomy  06/2003  . Resection of rectum    . Hernia repair    . Colostomy    .  Cataract surgery    . Cardiac catheterization  03/2013    [facility]     Family History  Problem Relation Age of Onset  . Heart disease Mother   . Heart disease Father   . Hyperlipidemia Father   . Hypertension Father   . Heart attack Brother 12    MI  . Heart disease Sister     stents placed   . Heart disease Sister     CABG     History   Social History  . Marital Status: Married    Spouse Name: N/A    Number of Children: N/A  . Years of Education: N/A   Occupational History  . Not on file.   Social History Main Topics  . Smoking status: Former Smoker -- 1.00 packs/day for 50 years    Types: Cigarettes    Quit date: 03/25/2013  .  Smokeless tobacco: Never Used  . Alcohol Use: No  . Drug Use: No  . Sexual Activity: Yes   Other Topics Concern  . Not on file   Social History Narrative  . No narrative on file     PHYSICAL EXAM   BP 128/98  Pulse 78  Ht 5\' 6"  (1.676 m)  Wt 170 lb 4 oz (77.225 kg)  BMI 27.49 kg/m2 Constitutional: He is oriented to person, place, and time. He appears well-developed and well-nourished. No distress.  HENT: No nasal discharge.  Head: Normocephalic and atraumatic.  Eyes: Pupils are equal and round.  No discharge. Neck: Normal range of motion. Neck supple. No JVD present. No thyromegaly present.  Cardiovascular: Normal rate, regular rhythm with premature beats, normal heart sounds. Exam reveals no gallop and no friction rub. There is a 2/6 holosystolic murmur at the apex and the left sternal border Pulmonary/Chest: Effort normal and breath sounds normal. No stridor. No respiratory distress. He has no wheezes. He has no rales. He exhibits no tenderness.  Abdominal: Soft. Bowel sounds are normal. He exhibits no distension. There is no tenderness. There is no rebound and no guarding.  Musculoskeletal: Normal range of motion. He exhibits no edema and no tenderness.  Neurological: He is alert and oriented to person, place, and time. Coordination normal.  Skin: Skin is warm and dry. No rash noted. He is not diaphoretic. No erythema. No pallor.  Psychiatric: He has a normal mood and affect. His behavior is normal. Judgment and thought content normal.     EKG: Sinus  Rhythm  -With rate variation  cv = 13. -  Nonspecific T-abnormality.   ABNORMAL      ASSESSMENT AND PLAN

## 2013-10-26 NOTE — Patient Instructions (Signed)
Your physician wants you to follow-up in:  6 months. You will receive a reminder letter in the mail two months in advance. If you don't receive a letter, please call our office to schedule the follow-up appointment.   

## 2013-10-26 NOTE — Assessment & Plan Note (Signed)
He has no symptoms of angina. Continue medical therapy.

## 2013-10-26 NOTE — Assessment & Plan Note (Signed)
Lab Results  Component Value Date   CHOL 219* 04/14/2012   HDL 64.20 04/14/2012   LDLDIRECT 129.7 04/14/2012   TRIG 72.0 04/14/2012   CHOLHDL 3 04/14/2012   He reports severe myalgia with statins. He is currently on Zetia and Niacin.

## 2013-10-26 NOTE — Assessment & Plan Note (Signed)
He is actually doing well and continues to be in [place] class II. He did not tolerate spironolactone due to hyperkalemia. He appears to be euvolemic on small dose Lasix. Continue same treatment and followup in 6 months.

## 2013-10-26 NOTE — Assessment & Plan Note (Signed)
He has no cardiac murmur. This was moderate by TEE.

## 2013-11-27 ENCOUNTER — Other Ambulatory Visit: Payer: Self-pay | Admitting: *Deleted

## 2013-11-27 MED ORDER — NIACIN ER (ANTIHYPERLIPIDEMIC) 1000 MG PO TBCR: EXTENDED_RELEASE_TABLET | ORAL | Status: DC

## 2013-12-14 ENCOUNTER — Telehealth: Payer: Self-pay | Admitting: *Deleted

## 2013-12-14 MED ORDER — CLONAZEPAM 0.5 MG PO TABS: ORAL_TABLET | ORAL | Status: DC

## 2013-12-14 NOTE — Telephone Encounter (Signed)
Pt requesting medication refill. Last f/u appt 03/2013. Ok to fill per Dr Deborra Medina. Awaiting signature and will be faxed to requested pharmacy

## 2013-12-23 ENCOUNTER — Telehealth: Payer: Self-pay | Admitting: Hematology and Oncology

## 2013-12-23 NOTE — Telephone Encounter (Signed)
Spk w/pt confirming MD/schedule change, mailing out updated sch.....  KJ

## 2013-12-25 NOTE — Telephone Encounter (Signed)
Curtis Campos left v/m requesting refill of Clonazepam to [facility] [place]. Did rx get faxed to pharmacy on 12/14/13?

## 2013-12-25 NOTE — Telephone Encounter (Signed)
Spoke to pts wife and informed her Rx was faxed on 12/14/13. Rx re-faxed at her request.

## 2014-01-22 ENCOUNTER — Ambulatory Visit: Payer: Medicare Other | Admitting: Family Medicine

## 2014-01-29 ENCOUNTER — Encounter: Payer: Self-pay | Admitting: Family Medicine

## 2014-01-29 ENCOUNTER — Ambulatory Visit (INDEPENDENT_AMBULATORY_CARE_PROVIDER_SITE_OTHER): Payer: BC Managed Care – PPO | Admitting: Family Medicine

## 2014-01-29 VITALS — BP 148/88 | HR 79 | Temp 98.0°F | Wt 184.5 lb

## 2014-01-29 DIAGNOSIS — Z23 Encounter for immunization: Secondary | ICD-10-CM | POA: Diagnosis not present

## 2014-01-29 DIAGNOSIS — I2510 Atherosclerotic heart disease of native coronary artery without angina pectoris: Secondary | ICD-10-CM

## 2014-01-29 DIAGNOSIS — I429 Cardiomyopathy, unspecified: Secondary | ICD-10-CM

## 2014-01-29 DIAGNOSIS — I509 Heart failure, unspecified: Secondary | ICD-10-CM

## 2014-01-29 DIAGNOSIS — Z8701 Personal history of pneumonia (recurrent): Secondary | ICD-10-CM

## 2014-01-29 DIAGNOSIS — I10 Essential (primary) hypertension: Secondary | ICD-10-CM | POA: Diagnosis not present

## 2014-01-29 DIAGNOSIS — E785 Hyperlipidemia, unspecified: Secondary | ICD-10-CM

## 2014-01-29 NOTE — Progress Notes (Addendum)
Subjective:    Patient ID: Curtis Campos, male    DOB: [birth year], 73 y.o.   MRN: [id number]  HPI Pleasant 73 yo male here with wife for follow up.  Seeing Dr. Fletcher Anon for heart failure and MAT. Was last seen on 10/26/13- note reviewed. Felt he was euvolemic on low dose lasix. Advised follow up in 6 months. No other changes made. No murmur noted on exam- does have moderate MR on TEE.  Lab Results  Component Value Date   CHOL 219* 04/14/2012   HDL 64.20 04/14/2012   LDLDIRECT 129.7 04/14/2012   TRIG 72.0 04/14/2012   CHOLHDL 3 04/14/2012      Current Outpatient Prescriptions on File Prior to Visit  Medication Sig Dispense Refill  . albuterol (PROVENTIL HFA;VENTOLIN HFA) 108 (90 BASE) MCG/ACT inhaler Inhale 2 puffs into the lungs every 6 (six) hours as needed for wheezing or shortness of breath. 1 Inhaler 2  . aspirin 81 MG tablet Take 81 mg by mouth daily. Takes at night     . clonazePAM (KLONOPIN) 0.5 MG tablet TAKE UP TO 1.5 TABLETS BY MOUTH AT BEDTIME AS NEEDED 60 tablet 0  . ezetimibe (ZETIA) 10 MG tablet Take 1 tablet (10 mg total) by mouth daily. 90 tablet 3  . furosemide (LASIX) 20 MG tablet Take 1 tablet (20 mg total) by mouth daily. 90 tablet 3  . lisinopril (PRINIVIL,ZESTRIL) 10 MG tablet Take 1 tablet (10 mg total) by mouth daily. 90 tablet 3  . magnesium oxide (MAG-OX) 400 MG tablet Take 1 tablet (400 mg total) by mouth daily. 90 tablet 3  . metoprolol succinate (TOPROL-XL) 100 MG 24 hr tablet Take 1 tablet (100 mg total) by mouth daily. Take with or immediately following a meal. 90 tablet 3  . niacin (NIASPAN) 1000 MG CR tablet TAKE ONE TABLET BY MOUTH EVERY DAY AT BEDTIME 90 tablet 1   No current facility-administered medications on file prior to visit.    Allergies  Allergen Reactions  . Codeine     REACTION: nausea  . Statins     REACTION: maylgias    Past Medical History  Diagnosis Date  . Hyperlipidemia   . Arthritis 03/12/2011  . Renal cancer   .  Rectal cancer   . Hypertension   . CHF (congestive heart failure)   . Chronic obstructive lung disease   . Mitral regurgitation   . Coronary artery disease 03/2013    EF of 25%, moderate to severe mitral regurgitation. Catheterization showed normal cardiac output with mild pulmonary hypertension. There was 99% stenosis in the mid left circumflex which appeared to be chronic with akinetic basal inferior/posterior wall. Mild LAD and RCA disease.  . Chronic systolic heart failure 96/2836    EF of 25% with moderate to severe mitral regurgitation    Past Surgical History  Procedure Laterality Date  . Appendectomy  06/2003  . Resection of rectum    . Hernia repair    . Colostomy    . Cataract surgery    . Cardiac catheterization  03/2013    [facility]    Family History  Problem Relation Age of Onset  . Heart disease Mother   . Heart disease Father   . Hyperlipidemia Father   . Hypertension Father   . Heart attack Brother 66    MI  . Heart disease Sister     stents placed   . Heart disease Sister     CABG    History  Social History  . Marital Status: Married    Spouse Name: N/A    Number of Children: N/A  . Years of Education: N/A   Occupational History  . Not on file.   Social History Main Topics  . Smoking status: Former Smoker -- 1.00 packs/day for 50 years    Types: Cigarettes    Quit date: 03/25/2013  . Smokeless tobacco: Never Used  . Alcohol Use: No  . Drug Use: No  . Sexual Activity: Yes   Other Topics Concern  . Not on file   Social History Narrative   Review of Systems See HPI Denies anxiety or depression No CP or SOB Has more energy Appetite improved- Wt Readings from Last 3 Encounters:  01/29/14 184 lb 8 oz (83.689 kg)  10/26/13 170 lb 4 oz (77.225 kg)  10/18/13 173 lb 8 oz (78.699 kg)     Objective:   Physical Exam  BP 148/88 mmHg  Pulse 79  Temp(Src) 98 F (36.7 C) (Oral)  Wt 184 lb 8 oz (83.689 kg)  SpO2 99%  Constitutional: He  appears well-developed. No distress. wearing O2 per nasal canula. Neck: Normal range of motion. Neck supple. No thyromegaly present.  Cardiovascular: Normal rate and normal heart sounds.  Exam reveals no gallop.   No murmur heard. Pulmonary/Chest: Effort normal and breath sounds normal. No respiratory distress. He has no wheezes. He has no rales.  Musculoskeletal: He exhibits no edema and no tenderness.  Lymphadenopathy:    He has no cervical adenopathy.  Psychiatric:  Normal speech     Assessment & Plan:

## 2014-01-29 NOTE — Assessment & Plan Note (Signed)
Influenza and prevnar 13 vaccinations given today.

## 2014-01-29 NOTE — Assessment & Plan Note (Signed)
Doing well. No changes made today.

## 2014-01-29 NOTE — Patient Instructions (Signed)
Great to see you. We will call you with your lab results from today. 

## 2014-01-29 NOTE — Assessment & Plan Note (Signed)
Clinically euvolemic 

## 2014-01-29 NOTE — Assessment & Plan Note (Signed)
Due for labs today. Orders Placed This Encounter  Procedures  . Flu Vaccine QUAD 36+ mos PF IM (Fluarix Quad PF)  . Pneumococcal conjugate vaccine 13-valent IM  . CBC with Differential  . Comprehensive metabolic panel  . Lipid panel

## 2014-01-29 NOTE — Progress Notes (Signed)
Pre visit review using our clinic review tool, if applicable. No additional management support is needed unless otherwise documented below in the visit note. 

## 2014-01-30 LAB — CBC WITH DIFFERENTIAL/PLATELET
Basophils Absolute: 0.1 10*3/uL (ref 0.0–0.1)
Basophils Relative: 0.6 % (ref 0.0–3.0)
Eosinophils Absolute: 0.3 10*3/uL (ref 0.0–0.7)
Eosinophils Relative: 3.1 % (ref 0.0–5.0)
HCT: 36.6 % — ABNORMAL LOW (ref 39.0–52.0)
Hemoglobin: 12.1 g/dL — ABNORMAL LOW (ref 13.0–17.0)
Lymphocytes Relative: 23.7 % (ref 12.0–46.0)
Lymphs Abs: 2.2 10*3/uL (ref 0.7–4.0)
MCHC: 33 g/dL (ref 30.0–36.0)
MCV: 94.1 fl (ref 78.0–100.0)
MONOS PCT: 9.8 % (ref 3.0–12.0)
Monocytes Absolute: 0.9 10*3/uL (ref 0.1–1.0)
Neutro Abs: 5.7 10*3/uL (ref 1.4–7.7)
Neutrophils Relative %: 62.8 % (ref 43.0–77.0)
Platelets: 264 10*3/uL (ref 150.0–400.0)
RBC: 3.89 Mil/uL — ABNORMAL LOW (ref 4.22–5.81)
RDW: 14.1 % (ref 11.5–15.5)
WBC: 9.1 10*3/uL (ref 4.0–10.5)

## 2014-01-30 LAB — COMPREHENSIVE METABOLIC PANEL
ALT: 18 U/L (ref 0–53)
AST: 28 U/L (ref 0–37)
Albumin: 3.7 g/dL (ref 3.5–5.2)
Alkaline Phosphatase: 59 U/L (ref 39–117)
BUN: 17 mg/dL (ref 6–23)
CO2: 31 meq/L (ref 19–32)
Calcium: 9.3 mg/dL (ref 8.4–10.5)
Chloride: 98 mEq/L (ref 96–112)
Creatinine, Ser: 0.9 mg/dL (ref 0.4–1.5)
GFR: 90.15 mL/min (ref >60.00)
Glucose, Bld: 74 mg/dL (ref 70–99)
POTASSIUM: 5 meq/L (ref 3.5–5.1)
Sodium: 133 mEq/L — ABNORMAL LOW (ref 135–145)
TOTAL PROTEIN: 7.2 g/dL (ref 6.0–8.3)
Total Bilirubin: 0.7 mg/dL (ref 0.2–1.2)

## 2014-01-30 LAB — LIPID PANEL
CHOL/HDL RATIO: 3
Cholesterol: 222 mg/dL — ABNORMAL HIGH (ref 0–200)
HDL: 63.5 mg/dL (ref >39.00)
LDL Cholesterol: 138 mg/dL — ABNORMAL HIGH (ref 0–99)
NonHDL: 158.5
TRIGLYCERIDES: 102 mg/dL (ref 0.0–149.0)
VLDL: 20.4 mg/dL (ref 0.0–40.0)

## 2014-02-01 ENCOUNTER — Encounter: Payer: Self-pay | Admitting: *Deleted

## 2014-02-26 ENCOUNTER — Other Ambulatory Visit: Payer: Self-pay | Admitting: *Deleted

## 2014-02-26 MED ORDER — CLONAZEPAM 0.5 MG PO TABS: ORAL_TABLET | ORAL | Status: DC

## 2014-02-26 NOTE — Telephone Encounter (Signed)
Pt requesting medication refill. Last f/u appt 01/2014. pls advise 

## 2014-02-26 NOTE — Telephone Encounter (Signed)
Rx called in to requested pharmacy 

## 2014-02-27 NOTE — Telephone Encounter (Signed)
pts wife left v/m requesting status of clonazepam; Kim at [facility] said received refill but still getting med ready for pickup. Left v/m for pts wife with that info.

## 2014-03-09 ENCOUNTER — Other Ambulatory Visit: Payer: Self-pay

## 2014-03-09 DIAGNOSIS — C20 Malignant neoplasm of rectum: Secondary | ICD-10-CM

## 2014-03-12 ENCOUNTER — Other Ambulatory Visit: Payer: BC Managed Care – PPO

## 2014-03-12 ENCOUNTER — Ambulatory Visit: Payer: BC Managed Care – PPO | Admitting: Oncology

## 2014-03-12 ENCOUNTER — Telehealth: Payer: Self-pay | Admitting: Hematology and Oncology

## 2014-03-12 ENCOUNTER — Other Ambulatory Visit (HOSPITAL_BASED_OUTPATIENT_CLINIC_OR_DEPARTMENT_OTHER): Payer: BC Managed Care – PPO

## 2014-03-12 ENCOUNTER — Ambulatory Visit (HOSPITAL_BASED_OUTPATIENT_CLINIC_OR_DEPARTMENT_OTHER): Payer: BC Managed Care – PPO | Admitting: Hematology and Oncology

## 2014-03-12 DIAGNOSIS — Z85048 Personal history of other malignant neoplasm of rectum, rectosigmoid junction, and anus: Secondary | ICD-10-CM

## 2014-03-12 DIAGNOSIS — C20 Malignant neoplasm of rectum: Secondary | ICD-10-CM

## 2014-03-12 LAB — CBC WITH DIFFERENTIAL/PLATELET
BASO%: 0.7 % (ref 0.0–2.0)
BASOS ABS: 0.1 10*3/uL (ref 0.0–0.1)
EOS ABS: 0.2 10*3/uL (ref 0.0–0.5)
EOS%: 2.1 % (ref 0.0–7.0)
HCT: 37.1 % — ABNORMAL LOW (ref 38.4–49.9)
HEMOGLOBIN: 12 g/dL — AB (ref 13.0–17.1)
LYMPH%: 16.5 % (ref 14.0–49.0)
MCH: 30.5 pg (ref 27.2–33.4)
MCHC: 32.4 g/dL (ref 32.0–36.0)
MCV: 94.3 fL (ref 79.3–98.0)
MONO#: 1 10*3/uL — ABNORMAL HIGH (ref 0.1–0.9)
MONO%: 9.2 % (ref 0.0–14.0)
NEUT%: 71.5 % (ref 39.0–75.0)
NEUTROS ABS: 7.9 10*3/uL — AB (ref 1.5–6.5)
Platelets: 275 10*3/uL (ref 140–400)
RBC: 3.94 10*6/uL — ABNORMAL LOW (ref 4.20–5.82)
RDW: 13.3 % (ref 11.0–14.6)
WBC: 11 10*3/uL — ABNORMAL HIGH (ref 4.0–10.3)
lymph#: 1.8 10*3/uL (ref 0.9–3.3)

## 2014-03-12 LAB — COMPREHENSIVE METABOLIC PANEL (CC13)
ALBUMIN: 3.8 g/dL (ref 3.5–5.0)
ALK PHOS: 67 U/L (ref 40–150)
ALT: 15 U/L (ref 0–55)
AST: 17 U/L (ref 5–34)
Anion Gap: 11 mEq/L (ref 3–11)
BUN: 13.6 mg/dL (ref 7.0–26.0)
CALCIUM: 9.3 mg/dL (ref 8.4–10.4)
CO2: 23 meq/L (ref 22–29)
Chloride: 100 mEq/L (ref 98–109)
Creatinine: 0.8 mg/dL (ref 0.7–1.3)
EGFR: 88 mL/min/{1.73_m2} — AB (ref >90)
GLUCOSE: 106 mg/dL (ref 70–140)
POTASSIUM: 4.5 meq/L (ref 3.5–5.1)
Sodium: 134 mEq/L — ABNORMAL LOW (ref 136–145)
Total Bilirubin: 0.55 mg/dL (ref 0.20–1.20)
Total Protein: 6.8 g/dL (ref 6.4–8.3)

## 2014-03-12 NOTE — Assessment & Plan Note (Signed)
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  Surveillance: Annual CEA had been normal. CEA done today is pending. CBC CMP without any major abnormalities. I discussed with the patient regarding screening colonoscopy. But he refused because he does not want anyone to go through the ostomy since it has been working so well and he does not want anyone to mess it up. I discussed with him that routine colonoscopy surveillance is a standard of care. But patient refused.  Survivorship: I discussed with him the importance of exercise and eating more fruits and vegetables and decreasing the risk of recurrence of colon cancer. Return to clinic once a year for follow-up and lab work.

## 2014-03-12 NOTE — Progress Notes (Signed)
Patient Care Team: Lucille Passy, MD as PCP - General  DIAGNOSIS: 73 year old gentleman with adenocarcinoma of the rectum diagnosed December 2004  PRIOR THERAPY:  Patient initially presented with a large rectal mass measuring 3.5 x 3.0 cm about 5.0 cm from the anal verge. He received preop chemoradiation with chemotherapy consisting of infusional 5-FU. He completed all of his therapy in February 2005 and then underwent an APR with colostomy.  CURRENT THERAPY: Observation  CHIEF COMPLIANT: Follow-up of rectal cancer  INTERVAL HISTORY: Curtis Campos is a 73 year old Caucasian gentleman with above-mentioned history of rectal cancer treated with neoadjuvant chemotherapy and radiation followed by surgery. He is here for annual follow-up. He reports no new problems or concerns. His ostomy is working great. He denies any blood in the stools. Discussed good energy levels and stays very active.  REVIEW OF SYSTEMS:   Constitutional: Denies fevers, chills or abnormal weight loss Eyes: Denies blurriness of vision Ears, nose, mouth, throat, and face: Denies mucositis or sore throat Respiratory: Denies cough, dyspnea or wheezes Cardiovascular: Denies palpitation, chest discomfort or lower extremity swelling Gastrointestinal:  Denies nausea, heartburn or change in bowel habits; ostomy working very well. Skin: Denies abnormal skin rashes Lymphatics: Denies new lymphadenopathy or easy bruising Neurological:Denies numbness, tingling or new weaknesses Behavioral/Psych: Mood is stable, no new changes   All other systems were reviewed with the patient and are negative.  I have reviewed the past medical history, past surgical history, social history and family history with the patient and they are unchanged from previous note.  ALLERGIES:  is allergic to codeine and statins.  MEDICATIONS:  Current Outpatient Prescriptions  Medication Sig Dispense Refill  . albuterol (PROVENTIL HFA;VENTOLIN HFA) 108  (90 BASE) MCG/ACT inhaler Inhale 2 puffs into the lungs every 6 (six) hours as needed for wheezing or shortness of breath. 1 Inhaler 2  . aspirin 81 MG tablet Take 81 mg by mouth daily. Takes at night     . clonazePAM (KLONOPIN) 0.5 MG tablet TAKE UP TO 1.5 TABLETS BY MOUTH AT BEDTIME AS NEEDED 60 tablet 0  . ezetimibe (ZETIA) 10 MG tablet Take 1 tablet (10 mg total) by mouth daily. 90 tablet 3  . furosemide (LASIX) 20 MG tablet Take 1 tablet (20 mg total) by mouth daily. 90 tablet 3  . lisinopril (PRINIVIL,ZESTRIL) 10 MG tablet Take 1 tablet (10 mg total) by mouth daily. 90 tablet 3  . magnesium oxide (MAG-OX) 400 MG tablet Take 1 tablet (400 mg total) by mouth daily. 90 tablet 3  . metoprolol succinate (TOPROL-XL) 100 MG 24 hr tablet Take 1 tablet (100 mg total) by mouth daily. Take with or immediately following a meal. 90 tablet 3  . niacin (NIASPAN) 1000 MG CR tablet TAKE ONE TABLET BY MOUTH EVERY DAY AT BEDTIME 90 tablet 1   No current facility-administered medications for this visit.    PHYSICAL EXAMINATION: ECOG PERFORMANCE STATUS: 0 - Asymptomatic  Filed Vitals:   03/12/14 1157  BP: 169/84  Pulse: 84  Temp: 98.1 F (36.7 C)  Resp: 18   Filed Weights   03/12/14 1157  Weight: 183 lb 3.2 oz (83.099 kg)    GENERAL:alert, no distress and comfortable SKIN: skin color, texture, turgor are normal, no rashes or significant lesions EYES: normal, Conjunctiva are pink and non-injected, sclera clear OROPHARYNX:no exudate, no erythema and lips, buccal mucosa, and tongue normal  NECK: supple, thyroid normal size, non-tender, without nodularity LYMPH:  no palpable lymphadenopathy in the cervical, axillary  or inguinal LUNGS: clear to auscultation and percussion with normal breathing effort HEART: regular rate & rhythm and no murmurs and no lower extremity edema ABDOMEN:abdomen soft, non-tender and normal bowel sounds; left-sided ostomy is normal Musculoskeletal:no cyanosis of digits and  no clubbing  NEURO: alert & oriented x 3 with fluent speech, no focal motor/sensory deficits   LABORATORY DATA:  I have reviewed the data as listed   Chemistry      Component Value Date/Time   NA 134* 03/12/2014 1148   NA 133* 01/29/2014 1647   NA 133* 05/26/2013 1043   NA 142 03/01/2009 1331   K 4.5 03/12/2014 1148   K 5.0 01/29/2014 1647   K 4.5 03/01/2009 1331   CL 98 01/29/2014 1647   CL 101 03/11/2012 1404   CL 99 03/01/2009 1331   CO2 23 03/12/2014 1148   CO2 31 01/29/2014 1647   CO2 30 03/01/2009 1331   BUN 13.6 03/12/2014 1148   BUN 17 01/29/2014 1647   BUN 14 05/26/2013 1043   BUN 15 03/01/2009 1331   CREATININE 0.8 03/12/2014 1148   CREATININE 0.9 01/29/2014 1647   CREATININE 0.7 03/01/2009 1331      Component Value Date/Time   CALCIUM 9.3 03/12/2014 1148   CALCIUM 9.3 01/29/2014 1647   CALCIUM 9.2 03/01/2009 1331   ALKPHOS 67 03/12/2014 1148   ALKPHOS 59 01/29/2014 1647   ALKPHOS 68 03/01/2009 1331   AST 17 03/12/2014 1148   AST 28 01/29/2014 1647   AST 26 03/01/2009 1331   ALT 15 03/12/2014 1148   ALT 18 01/29/2014 1647   ALT 28 03/01/2009 1331   BILITOT 0.55 03/12/2014 1148   BILITOT 0.7 01/29/2014 1647   BILITOT 0.60 03/01/2009 1331       Lab Results  Component Value Date   WBC 11.0* 03/12/2014   HGB 12.0* 03/12/2014   HCT 37.1* 03/12/2014   MCV 94.3 03/12/2014   PLT 275 03/12/2014   NEUTROABS 7.9* 03/12/2014    ASSESSMENT & PLAN:  Malignant neoplasm of rectum Adenocarcinoma the rectum diagnosed December 2004 presented as 3.5 cm mass 5 cm from the anal verge status post neoadjuvant chemoradiation with IV 5-FU followed by APR with colostomy.  Surveillance: Annual CEA had been normal. CEA done today is pending. CBC CMP without any major abnormalities. I discussed with the patient regarding screening colonoscopy. But he refused because he does not want anyone to go through the ostomy since it has been working so well and he does not want  anyone to mess it up. I discussed with him that routine colonoscopy surveillance is a standard of care. But patient refused.  Survivorship: I discussed with him the importance of exercise and eating more fruits and vegetables and decreasing the risk of recurrence of colon cancer. Return to clinic once a year for follow-up and lab work.   Orders Placed This Encounter  Procedures  . CBC with Differential    Standing Status: Future     Number of Occurrences:      Standing Expiration Date: 03/12/2015  . Comprehensive metabolic panel (Cmet) - [facility]    Standing Status: Future     Number of Occurrences:      Standing Expiration Date: 03/12/2015  . CEA    Standing Status: Future     Number of Occurrences:      Standing Expiration Date: 03/12/2015   The patient has a good understanding of the overall plan. he agrees with it. She will call  with any problems that may develop before her next visit here.   Rulon Eisenmenger, MD 03/12/2014 1:09 PM

## 2014-03-12 NOTE — Telephone Encounter (Signed)
, °

## 2014-03-13 LAB — CEA: CEA: 1.1 ng/mL (ref 0.0–5.0)

## 2014-03-21 ENCOUNTER — Ambulatory Visit (INDEPENDENT_AMBULATORY_CARE_PROVIDER_SITE_OTHER): Payer: BC Managed Care – PPO | Admitting: Nurse Practitioner

## 2014-03-21 ENCOUNTER — Encounter: Payer: Self-pay | Admitting: Nurse Practitioner

## 2014-03-21 VITALS — BP 148/62 | HR 86 | Temp 97.9°F | Resp 14 | Ht 66.0 in | Wt 186.1 lb

## 2014-03-21 DIAGNOSIS — R05 Cough: Secondary | ICD-10-CM | POA: Diagnosis not present

## 2014-03-21 DIAGNOSIS — R059 Cough, unspecified: Secondary | ICD-10-CM

## 2014-03-21 DIAGNOSIS — R21 Rash and other nonspecific skin eruption: Secondary | ICD-10-CM

## 2014-03-21 DIAGNOSIS — I2510 Atherosclerotic heart disease of native coronary artery without angina pectoris: Secondary | ICD-10-CM

## 2014-03-21 MED ORDER — AZITHROMYCIN 250 MG PO TABS: ORAL_TABLET | ORAL | Status: DC

## 2014-03-21 MED ORDER — METHYLPREDNISOLONE (PAK) 4 MG PO TABS: ORAL_TABLET | ORAL | Status: DC

## 2014-03-21 NOTE — Patient Instructions (Addendum)
Finish all antibiotics prescribed even if feeling better.   Call us if it worsens, fails to improve, or fever of 101 or greater.  You can take Delsym for the cough.  Merry Christmas!

## 2014-03-21 NOTE — Progress Notes (Signed)
Subjective:    Patient ID: Curtis Campos, male    DOB: 1940/04/27, 73 y.o.   MRN: [id number]  HPI   1) Coughing for 1 week, green sputum, clear from nose, and coughing a lot at night  Cough- Delsym   2) dry itchy skin on hands, between fingers, has been on hands previously and cleared with steroids. Saw dermatology previously.   Pt had no other concerns or need for refills.   Review of Systems  HENT: Positive for congestion and rhinorrhea. Negative for ear discharge, ear pain and sneezing.   Eyes: Negative for visual disturbance.  Respiratory: Positive for cough and wheezing. Negative for chest tightness.   Gastrointestinal: Negative for nausea, vomiting and diarrhea.  Neurological: Negative for dizziness and headaches.   Past Medical History  Diagnosis Date  . Hyperlipidemia   . Arthritis 03/12/2011  . Renal cancer   . Rectal cancer   . Hypertension   . CHF (congestive heart failure)   . Chronic obstructive lung disease   . Mitral regurgitation   . Coronary artery disease 03/2013    EF of 25%, moderate to severe mitral regurgitation. Catheterization showed normal cardiac output with mild pulmonary hypertension. There was 99% stenosis in the mid left circumflex which appeared to be chronic with akinetic basal inferior/posterior wall. Mild LAD and RCA disease.  . Chronic systolic heart failure 19/5093    EF of 25% with moderate to severe mitral regurgitation    History   Social History  . Marital Status: Married    Spouse Name: N/A    Number of Children: N/A  . Years of Education: N/A   Occupational History  . Not on file.   Social History Main Topics  . Smoking status: Former Smoker -- 1.00 packs/day for 50 years    Types: Cigarettes    Quit date: 03/25/2013  . Smokeless tobacco: Never Used  . Alcohol Use: No  . Drug Use: No  . Sexual Activity: Yes   Other Topics Concern  . Not on file   Social History Narrative    Past Surgical History  Procedure  Laterality Date  . Appendectomy  06/2003  . Resection of rectum    . Hernia repair    . Colostomy    . Cataract surgery    . Cardiac catheterization  03/2013    [facility]    Family History  Problem Relation Age of Onset  . Heart disease Mother   . Heart disease Father   . Hyperlipidemia Father   . Hypertension Father   . Heart attack Brother 65    MI  . Heart disease Sister     stents placed   . Heart disease Sister     CABG    Allergies  Allergen Reactions  . Codeine     REACTION: nausea  . Statins     REACTION: maylgias    Current Outpatient Prescriptions on File Prior to Visit  Medication Sig Dispense Refill  . albuterol (PROVENTIL HFA;VENTOLIN HFA) 108 (90 BASE) MCG/ACT inhaler Inhale 2 puffs into the lungs every 6 (six) hours as needed for wheezing or shortness of breath. 1 Inhaler 2  . aspirin 81 MG tablet Take 81 mg by mouth daily. Takes at night     . clonazePAM (KLONOPIN) 0.5 MG tablet TAKE UP TO 1.5 TABLETS BY MOUTH AT BEDTIME AS NEEDED 60 tablet 0  . ezetimibe (ZETIA) 10 MG tablet Take 1 tablet (10 mg total) by mouth daily. Marcus  tablet 3  . furosemide (LASIX) 20 MG tablet Take 1 tablet (20 mg total) by mouth daily. 90 tablet 3  . lisinopril (PRINIVIL,ZESTRIL) 10 MG tablet Take 1 tablet (10 mg total) by mouth daily. 90 tablet 3  . magnesium oxide (MAG-OX) 400 MG tablet Take 1 tablet (400 mg total) by mouth daily. 90 tablet 3  . metoprolol succinate (TOPROL-XL) 100 MG 24 hr tablet Take 1 tablet (100 mg total) by mouth daily. Take with or immediately following a meal. 90 tablet 3  . niacin (NIASPAN) 1000 MG CR tablet TAKE ONE TABLET BY MOUTH EVERY DAY AT BEDTIME 90 tablet 1   No current facility-administered medications on file prior to visit.       Objective:   Physical Exam  Constitutional: He is oriented to person, place, and time. He appears well-developed and well-nourished. No distress.  Eyes: Conjunctivae and EOM are normal. Pupils are equal, round, and  reactive to light. Right eye exhibits no discharge. Left eye exhibits no discharge. No scleral icterus.  Cardiovascular: Normal rate and regular rhythm.   Pulmonary/Chest: Effort normal and breath sounds normal. No respiratory distress. He has no wheezes. He has no rales. He exhibits no tenderness.  Neurological: He is alert and oriented to person, place, and time.  Skin: Skin is warm and dry. Rash noted. He is not diaphoretic.  Dry red rash on palms and between fingers. Rough in texture.   Psychiatric: He has a normal mood and affect. His behavior is normal. Judgment and thought content normal.     BP 148/62 mmHg  Pulse 86  Temp(Src) 97.9 F (36.6 C) (Oral)  Resp 14  Ht 5\' 6"  (1.676 m)  Wt 186 lb 1.9 oz (84.423 kg)  BMI 30.05 kg/m2  SpO2 97%     Assessment & Plan:

## 2014-03-21 NOTE — Progress Notes (Signed)
Pre visit review using our clinic review tool, if applicable. No additional management support is needed unless otherwise documented below in the visit note. 

## 2014-03-22 DIAGNOSIS — R21 Rash and other nonspecific skin eruption: Secondary | ICD-10-CM | POA: Insufficient documentation

## 2014-03-22 DIAGNOSIS — R059 Cough, unspecified: Secondary | ICD-10-CM | POA: Insufficient documentation

## 2014-03-22 DIAGNOSIS — R05 Cough: Secondary | ICD-10-CM | POA: Insufficient documentation

## 2014-03-22 NOTE — Assessment & Plan Note (Signed)
Stable. Rx for medrol dose-pak. Pt had this in past with resolution of rash he reports. Will also help with wheezing. FU prn. Will see Dr. Fletcher Anon in Jan.

## 2014-03-22 NOTE — Assessment & Plan Note (Signed)
Cough x 7 days, worsening. Z-pack given to cover over holiday season. Pt has hx of CAP. Medrol-dose pak for wheezing. FU prn.

## 2014-03-24 ENCOUNTER — Other Ambulatory Visit: Payer: Self-pay | Admitting: Internal Medicine

## 2014-03-26 NOTE — Telephone Encounter (Signed)
Last OV and labs reviewed, will send in zetia

## 2014-04-12 ENCOUNTER — Other Ambulatory Visit: Payer: Self-pay | Admitting: *Deleted

## 2014-04-12 MED ORDER — CLONAZEPAM 0.5 MG PO TABS: ORAL_TABLET | ORAL | Status: DC

## 2014-04-12 NOTE — Telephone Encounter (Signed)
Pt requesting medication refill. Last f/u appt 01/2014. pls advise 

## 2014-04-12 NOTE — Telephone Encounter (Signed)
Rx called in to requested pharmacy 

## 2014-04-12 NOTE — Telephone Encounter (Signed)
Ok to phone in clonazepam

## 2014-04-19 ENCOUNTER — Other Ambulatory Visit: Payer: Self-pay | Admitting: Cardiovascular Disease

## 2014-04-19 NOTE — Telephone Encounter (Signed)
Spironolactone 25 mg needs refill

## 2014-04-24 ENCOUNTER — Encounter: Payer: Self-pay | Admitting: Cardiovascular Disease

## 2014-04-24 ENCOUNTER — Ambulatory Visit (INDEPENDENT_AMBULATORY_CARE_PROVIDER_SITE_OTHER): Payer: BC Managed Care – PPO | Admitting: Cardiovascular Disease

## 2014-04-24 ENCOUNTER — Other Ambulatory Visit: Payer: Self-pay

## 2014-04-24 ENCOUNTER — Encounter (INDEPENDENT_AMBULATORY_CARE_PROVIDER_SITE_OTHER): Payer: Self-pay

## 2014-04-24 VITALS — BP 130/86 | HR 83 | Ht 66.0 in | Wt 184.0 lb

## 2014-04-24 DIAGNOSIS — I340 Nonrheumatic mitral (valve) insufficiency: Secondary | ICD-10-CM

## 2014-04-24 DIAGNOSIS — I5022 Chronic systolic (congestive) heart failure: Secondary | ICD-10-CM

## 2014-04-24 DIAGNOSIS — I2510 Atherosclerotic heart disease of native coronary artery without angina pectoris: Secondary | ICD-10-CM | POA: Diagnosis not present

## 2014-04-24 DIAGNOSIS — I10 Essential (primary) hypertension: Secondary | ICD-10-CM

## 2014-04-24 DIAGNOSIS — E785 Hyperlipidemia, unspecified: Secondary | ICD-10-CM

## 2014-04-24 MED ORDER — LISINOPRIL 20 MG PO TABS: 20.0000 mg | ORAL_TABLET | Freq: Every day | ORAL | Status: DC

## 2014-04-24 MED ORDER — METOPROLOL SUCCINATE ER 100 MG PO TB24: 100.0000 mg | ORAL_TABLET | Freq: Every day | ORAL | Status: DC

## 2014-04-24 MED ORDER — FUROSEMIDE 20 MG PO TABS: 20.0000 mg | ORAL_TABLET | Freq: Every day | ORAL | Status: DC

## 2014-04-24 MED ORDER — SPIRONOLACTONE 25 MG PO TABS: 25.0000 mg | ORAL_TABLET | Freq: Every day | ORAL | Status: DC

## 2014-04-24 NOTE — Progress Notes (Signed)
HPI  This is a pleasant 74 year old man who is here today for a  followup visit regarding chronic systolic heart failure and mitral regurgitation.  He has known history of hypertension, tobacco use and COPD.   He was hospitalized in January of 2015 for congestive heart failure. Echocardiogram in 03/2013 showed an ejection fraction of 25% with inferoposterior akinesis and severe mitral regurgitation with posterior jet.  A right and left cardiac catheterization showed severe one-vessel coronary artery disease with a subtotal occlusion of the mid left circumflex which appeared to be chronic. TEE was performed which showed that the mitral valve was normal in structure and regurgitation was moderate after optimizing his volume status.  He has been doing well and reports significant improvement in dyspnea. He denies any further orthopnea or PND. Repeat echocardiogram in April showed an ejection fraction of 30-35%, mild aortic stenosis and moderate mitral regurgitation. He was not interested ICD placement. He has been doing well and denies any chest pain or significant dyspnea. No orthopnea or PND. Blood pressure has been elevated lately.  Allergies  Allergen Reactions  . Codeine     REACTION: nausea  . Statins     REACTION: maylgias     Current Outpatient Prescriptions on File Prior to Visit  Medication Sig Dispense Refill  . albuterol (PROVENTIL HFA;VENTOLIN HFA) 108 (90 BASE) MCG/ACT inhaler Inhale 2 puffs into the lungs every 6 (six) hours as needed for wheezing or shortness of breath. 1 Inhaler 2  . aspirin 81 MG tablet Take 81 mg by mouth daily. Takes at night     . clonazePAM (KLONOPIN) 0.5 MG tablet TAKE UP TO 1.5 TABLETS BY MOUTH AT BEDTIME AS NEEDED 60 tablet 0  . furosemide (LASIX) 20 MG tablet Take 1 tablet (20 mg total) by mouth daily. 90 tablet 3  . lisinopril (PRINIVIL,ZESTRIL) 10 MG tablet TAKE ONE TABLET BY MOUTH ONCE DAILY 90 tablet 3  . magnesium oxide (MAG-OX) 400 MG  tablet Take 1 tablet (400 mg total) by mouth daily. 90 tablet 3  . metoprolol succinate (TOPROL-XL) 100 MG 24 hr tablet Take 1 tablet (100 mg total) by mouth daily. Take with or immediately following a meal. 90 tablet 3  . niacin (NIASPAN) 1000 MG CR tablet TAKE ONE TABLET BY MOUTH EVERY DAY AT BEDTIME 90 tablet 1  . spironolactone (ALDACTONE) 25 MG tablet TAKE ONE TABLET BY MOUTH ONCE DAILY 90 tablet 3  . ZETIA 10 MG tablet TAKE ONE TABLET BY MOUTH ONCE DAILY 90 tablet 0   No current facility-administered medications on file prior to visit.     Past Medical History  Diagnosis Date  . Hyperlipidemia   . Arthritis 03/12/2011  . Renal cancer   . Rectal cancer   . Hypertension   . CHF (congestive heart failure)   . Chronic obstructive lung disease   . Mitral regurgitation   . Coronary artery disease 03/2013    EF of 25%, moderate to severe mitral regurgitation. Catheterization showed normal cardiac output with mild pulmonary hypertension. There was 99% stenosis in the mid left circumflex which appeared to be chronic with akinetic basal inferior/posterior wall. Mild LAD and RCA disease.  . Chronic systolic heart failure 25/0539    EF of 25% with moderate to severe mitral regurgitation     Past Surgical History  Procedure Laterality Date  . Appendectomy  06/2003  . Resection of rectum    . Hernia repair    . Colostomy    .  Cataract surgery    . Cardiac catheterization  03/2013    [facility]     Family History  Problem Relation Age of Onset  . Heart disease Mother   . Heart disease Father   . Hyperlipidemia Father   . Hypertension Father   . Heart attack Brother 48    MI  . Heart disease Sister     stents placed   . Heart disease Sister     CABG     History   Social History  . Marital Status: Married    Spouse Name: N/A    Number of Children: N/A  . Years of Education: N/A   Occupational History  . Not on file.   Social History Main Topics  . Smoking status:  Former Smoker -- 1.00 packs/day for 50 years    Types: Cigarettes    Quit date: 03/25/2013  . Smokeless tobacco: Never Used  . Alcohol Use: No  . Drug Use: No  . Sexual Activity: Yes   Other Topics Concern  . Not on file   Social History Narrative     PHYSICAL EXAM   BP 130/86 mmHg  Pulse 83  Ht 5\' 6"  (1.676 m)  Wt 184 lb (83.462 kg)  BMI 29.71 kg/m2 Constitutional: He is oriented to person, place, and time. He appears well-developed and well-nourished. No distress.  HENT: No nasal discharge.  Head: Normocephalic and atraumatic.  Eyes: Pupils are equal and round.  No discharge. Neck: Normal range of motion. Neck supple. No JVD present. No thyromegaly present.  Cardiovascular: Normal rate, regular rhythm with premature beats, normal heart sounds. Exam reveals no gallop and no friction rub. There is a 1/6 holosystolic murmur at the apex and the left sternal border Pulmonary/Chest: Effort normal and breath sounds normal. No stridor. No respiratory distress. He has no wheezes. He has no rales. He exhibits no tenderness.  Abdominal: Soft. Bowel sounds are normal. He exhibits no distension. There is no tenderness. There is no rebound and no guarding.  Musculoskeletal: Normal range of motion. He exhibits no edema and no tenderness.  Neurological: He is alert and oriented to person, place, and time. Coordination normal.  Skin: Skin is warm and dry. No rash noted. He is not diaphoretic. No erythema. No pallor.  Psychiatric: He has a normal mood and affect. His behavior is normal. Judgment and thought content normal.     EKG: normal sinus rhythm with sinus arrhythmia. Nonspecific T wave changes.    ASSESSMENT AND PLAN

## 2014-04-24 NOTE — Telephone Encounter (Signed)
[name] at [facility] left v/m requesting new rx for clonazepam. Please advise. Pt last seen 01/29/14 and med last refilled to [facility] on 04/12/14. Unable to reach [facility] [place] to verify if med picked up or not on 04/12/14; store has closed.Please advise.

## 2014-04-24 NOTE — Assessment & Plan Note (Signed)
He is intolerant to statins.

## 2014-04-24 NOTE — Patient Instructions (Signed)
Your physician has recommended you make the following change in your medication:  Increase Lisinopril 20 mg once daily   Your physician wants you to follow-up in: 6 months. You will receive a reminder letter in the mail two months in advance. If you don't receive a letter, please call our office to schedule the follow-up appointment.

## 2014-04-24 NOTE — Assessment & Plan Note (Addendum)
He is actually doing well and continues to be in [place] class II.  He appears to be euvolemic on small dose Lasix. Recent labs showed normal renal function and electrolytes. I increased the dose of lisinopril to 20 mg daily given high blood pressure.

## 2014-04-24 NOTE — Assessment & Plan Note (Signed)
This was moderate on TEE.

## 2014-04-24 NOTE — Assessment & Plan Note (Signed)
He has no symptoms suggestive of angina. Continue medical therapy.

## 2014-04-25 ENCOUNTER — Telehealth: Payer: Self-pay | Admitting: Family Medicine

## 2014-04-25 NOTE — Telephone Encounter (Signed)
Lm on pt's wife's vm requesting a call back. Although store is closing, the pharmacy will still be open.

## 2014-04-25 NOTE — Telephone Encounter (Signed)
Pt wife called in and said that pharmacy is closing.  Pt would like to have prescriptions transferred to [facility]  Their number is [phone].  Please transfer rx's to this pharmacy.  Pt would lslo like to have 90 day supply of meds called in, so pt doesn't have to go into [place] as often. Best number for pt is [phone]. Thank you

## 2014-04-26 NOTE — Telephone Encounter (Signed)
Spoke to pt who states that the pharmacy has already transferred all medications from [facility] to [place].

## 2014-04-27 ENCOUNTER — Other Ambulatory Visit: Payer: Self-pay | Admitting: *Deleted

## 2014-04-27 MED ORDER — MAGNESIUM OXIDE 400 MG PO TABS: 400.0000 mg | ORAL_TABLET | Freq: Every day | ORAL | Status: DC

## 2014-04-27 NOTE — Telephone Encounter (Signed)
Curtis Campos at [facility] drug request cb about status of clonazepam refill.

## 2014-04-30 ENCOUNTER — Other Ambulatory Visit: Payer: Self-pay | Admitting: *Deleted

## 2014-04-30 NOTE — Telephone Encounter (Signed)
Spoke to pts wife and informed her that the Rx has already been called in to [facility]. Attempted to contact pharmacy; line busy. Requested pt contact pharmacy to verify they do not have Rx ready for pickup. [facility] will soon be closing, for good, and pt states that all Rx were transferred to [facility], but this Rx was submitted before request done. Advised pt to contact office if [facility] does not have Rx

## 2014-04-30 NOTE — Telephone Encounter (Signed)
Patient's wife left a voicemail stating that they had requested a refill on his Clonazepam 3-4 days ago and the pharmacy requested this and has not heard anything back. Last refill 04/12/14 #60, last office visit 01/29/14. [place]. Let patient know when this has been done.

## 2014-05-01 NOTE — Telephone Encounter (Signed)
Curtis Campos with [facility] left v/m requesting cb about clonazepam refill; spoke with Maudie Mercury at [facility] and Clonazepam #45 was picked up on 04/30/14. Curtis Campos with [place] drug request cb.

## 2014-05-01 NOTE — Telephone Encounter (Signed)
Spoke to [name] at pharmacy and verified with her pt picked up Rx from previous pharmacy. Last Rx lost in transit as Rx was written for #60 and pts insurance will only cover #45. They distributed remaining #15 tabs on 1/14 and pt received full Rx of #45 on 2/1

## 2014-05-28 ENCOUNTER — Other Ambulatory Visit: Payer: Self-pay | Admitting: Family Medicine

## 2014-05-28 MED ORDER — CLONAZEPAM 0.5 MG PO TABS: ORAL_TABLET | ORAL | Status: DC

## 2014-05-28 NOTE — Telephone Encounter (Signed)
Last f/u appt 01/2014

## 2014-05-28 NOTE — Telephone Encounter (Signed)
Rx called in to requested pharmacy 

## 2014-05-28 NOTE — Telephone Encounter (Signed)
Pharmacist left a voicemail requesting a refill on Clonazepam #45. They had to call because he is a new client to them.

## 2014-06-06 ENCOUNTER — Telehealth: Payer: Self-pay | Admitting: *Deleted

## 2014-06-06 ENCOUNTER — Telehealth: Payer: Self-pay | Admitting: Family Medicine

## 2014-06-06 NOTE — Telephone Encounter (Signed)
Spoke to pts wife and advised; pts wife verbally expressed understanding.

## 2014-06-06 NOTE — Telephone Encounter (Signed)
Wife requests cal back regarding a fax needed for continued oxygen machine use.  He was sent home from the hospital with the oxygen machine and still needs it. Insurance is requiring a note that states it is still needed.. Please call [phone] for further questions.

## 2014-06-06 NOTE — Telephone Encounter (Signed)
Order placed in Dr. Jacklynn Ganong box to be signed

## 2014-06-06 NOTE — Telephone Encounter (Signed)
Per last phone note, pt's PCP states that we need to complete this paperwork. Do you order O2?

## 2014-06-06 NOTE — Telephone Encounter (Signed)
His cardiologist, Dr. Fletcher Anon would need to fill out these forms.

## 2014-06-06 NOTE — Telephone Encounter (Signed)
Pt needs a renewal on oxygen machine  [facility] Not sure who prescribed it.  But wants [place] to look into this If we did order it they need to know for they would need something to renew that machine.  Marlou Sa mills is the contact person  [phone] phone number  [phone] [facility]

## 2014-06-11 NOTE — Telephone Encounter (Signed)
He might not need the oxygen anymore. We need to check his oxygen saturation at rest and after walking 5 minutes.

## 2014-06-18 ENCOUNTER — Telehealth: Payer: Self-pay

## 2014-06-18 NOTE — Telephone Encounter (Signed)
Calling regarding oxygen order. Nurse states there needs to be an addendum to 1/26 note faxed to her [phone]. States Medicare needs this for coverages. Please call.

## 2014-06-19 ENCOUNTER — Telehealth: Payer: Self-pay | Admitting: Family Medicine

## 2014-06-19 NOTE — Telephone Encounter (Signed)
Patient needs his last office note to be amended to say that he is on 2L of oxygen at bedtime in order for medicare to cover his oxygen  The O2 was ordered by Dr. Skipper Cliche at [facility]  PCP told [facility] that the oxygen order should come from cardiology

## 2014-06-19 NOTE — Telephone Encounter (Signed)
Barnetta [facility] called wanting waynette to call her about mr Vitug    Regarding home care  She needs ok for o2

## 2014-06-19 NOTE — Telephone Encounter (Signed)
[name] called also Ms Curtis Campos would like you to add to the 01/29/14 office visit pt is on o2 At night and this is for medicare o2 renewal  Ms Curtis Campos # [phone]

## 2014-06-19 NOTE — Telephone Encounter (Signed)
Cardiology has been managing this correct?  See telephone note from yesterday.

## 2014-06-19 NOTE — Telephone Encounter (Signed)
I would like for him to come in first to see if he still needs the oxygen since cardiology is not willing to do so.   Please schedule an office visit and I will addend November note.

## 2014-06-19 NOTE — Telephone Encounter (Signed)
Spoke to Curtis Campos regarding pts O2. She states that the pt was on O2 when he came in Nov, and Dr Deborra Medina states that she does remember seeing pt with O2 after being d/c from the ED. Marlou Sa states that the Nov 2015 note will need to be addended to state that the pt was on O2. She is also asking if you would be willing to sign the re-order.

## 2014-06-19 NOTE — Telephone Encounter (Signed)
This encounter was created in error - please disregard.

## 2014-06-19 NOTE — Telephone Encounter (Signed)
Pt wife calling back regarding oxygen order. Please call.

## 2014-06-20 NOTE — Telephone Encounter (Signed)
See phone note from 3/9:   He might not need the oxygen anymore. We need to check his oxygen saturation at rest and after walking 5 minutes.       We should bring him in and check this and document it.

## 2014-06-21 NOTE — Telephone Encounter (Signed)
LVM 3/24

## 2014-06-21 NOTE — Telephone Encounter (Signed)
LVM on pt home to call back and schedule ov

## 2014-06-25 ENCOUNTER — Other Ambulatory Visit: Payer: Self-pay | Admitting: Internal Medicine

## 2014-06-25 NOTE — Telephone Encounter (Signed)
Patients wife stated that patient has an appointment with PCP on Wednesday regarding the oxygen  She stated he still needs o2 at bedtime  Instructed patients wife to call Wednesday if his PCP wanted [place] to order the O2 and we will have him come in to do the test for O2 qualification

## 2014-06-25 NOTE — Telephone Encounter (Signed)
Pt scheduled 06/27/14-30 min ov with Dr. Deborra Medina

## 2014-06-27 ENCOUNTER — Encounter: Payer: Self-pay | Admitting: Family Medicine

## 2014-06-27 ENCOUNTER — Ambulatory Visit (INDEPENDENT_AMBULATORY_CARE_PROVIDER_SITE_OTHER): Payer: BLUE CROSS/BLUE SHIELD | Admitting: Family Medicine

## 2014-06-27 VITALS — BP 148/82 | HR 65 | Temp 98.1°F | Wt 187.8 lb

## 2014-06-27 DIAGNOSIS — Z9981 Dependence on supplemental oxygen: Secondary | ICD-10-CM

## 2014-06-27 DIAGNOSIS — I2510 Atherosclerotic heart disease of native coronary artery without angina pectoris: Secondary | ICD-10-CM

## 2014-06-27 NOTE — Progress Notes (Signed)
Pre visit review using our clinic review tool, if applicable. No additional management support is needed unless otherwise documented below in the visit note. 

## 2014-06-27 NOTE — Assessment & Plan Note (Signed)
No longer qualifies for O2. O2 sats 97% on RA at rest and 96% on RA ambulating. Discussed sleep study since he feels that O2 at night helps him to sleep better but he declined.  Advised him to follow up with cardiology and order for O2 renewal not done. The patient indicates understanding of these issues and agrees with the plan.

## 2014-06-27 NOTE — Progress Notes (Signed)
Subjective:   Patient ID: Curtis Campos, male    DOB: 11-05-1940, 74 y.o.   MRN: [id number]  Curtis Campos is a pleasant 74 y.o. year old male who presents to clinic today with oxygen eval  on 06/27/2014  HPI:  H/o CHF, MR, CAD, followed by Dr. Fletcher Anon here for oxygen evaluation.  Home health has requested a renewal on his home oxygen.    He never wears his O2 during the day anymore.  Sometimes he wears it at night because "he feels better."  Denies any CP, fatigue or SOB.  Current Outpatient Prescriptions on File Prior to Visit  Medication Sig Dispense Refill  . albuterol (PROVENTIL HFA;VENTOLIN HFA) 108 (90 BASE) MCG/ACT inhaler Inhale 2 puffs into the lungs every 6 (six) hours as needed for wheezing or shortness of breath. 1 Inhaler 2  . aspirin 81 MG tablet Take 81 mg by mouth daily. Takes at night     . clonazePAM (KLONOPIN) 0.5 MG tablet TAKE UP TO 1.5 TABLETS BY MOUTH AT BEDTIME AS NEEDED 45 tablet 0  . furosemide (LASIX) 20 MG tablet Take 1 tablet (20 mg total) by mouth daily. 90 tablet 3  . lisinopril (PRINIVIL,ZESTRIL) 20 MG tablet Take 1 tablet (20 mg total) by mouth daily. 90 tablet 3  . magnesium oxide (MAG-OX) 400 MG tablet Take 1 tablet (400 mg total) by mouth daily. 90 tablet 3  . metoprolol succinate (TOPROL-XL) 100 MG 24 hr tablet Take 1 tablet (100 mg total) by mouth daily. Take with or immediately following a meal. 90 tablet 3  . niacin (NIASPAN) 1000 MG CR tablet TAKE 1 TABLET AT BEDTIME. 30 tablet 2  . spironolactone (ALDACTONE) 25 MG tablet Take 1 tablet (25 mg total) by mouth daily. 90 tablet 3  . ZETIA 10 MG tablet TAKE 1 TABLET ONCE DAILY 30 tablet 11   No current facility-administered medications on file prior to visit.    Allergies  Allergen Reactions  . Codeine     REACTION: nausea  . Statins     REACTION: maylgias    Past Medical History  Diagnosis Date  . Hyperlipidemia   . Arthritis 03/12/2011  . Renal cancer   . Rectal cancer   .  Hypertension   . CHF (congestive heart failure)   . Chronic obstructive lung disease   . Mitral regurgitation   . Coronary artery disease 03/2013    EF of 25%, moderate to severe mitral regurgitation. Catheterization showed normal cardiac output with mild pulmonary hypertension. There was 99% stenosis in the mid left circumflex which appeared to be chronic with akinetic basal inferior/posterior wall. Mild LAD and RCA disease.  . Chronic systolic heart failure 11/3265    EF of 25% with moderate to severe mitral regurgitation    Past Surgical History  Procedure Laterality Date  . Appendectomy  06/2003  . Resection of rectum    . Hernia repair    . Colostomy    . Cataract surgery    . Cardiac catheterization  03/2013    [facility]    Family History  Problem Relation Age of Onset  . Heart disease Mother   . Heart disease Father   . Hyperlipidemia Father   . Hypertension Father   . Heart attack Brother 41    MI  . Heart disease Sister     stents placed   . Heart disease Sister     CABG    History   Social History  .  Marital Status: Married    Spouse Name: N/A  . Number of Children: N/A  . Years of Education: N/A   Occupational History  . Not on file.   Social History Main Topics  . Smoking status: Former Smoker -- 1.00 packs/day for 50 years    Types: Cigarettes    Quit date: 03/25/2013  . Smokeless tobacco: Never Used  . Alcohol Use: No  . Drug Use: No  . Sexual Activity: Yes   Other Topics Concern  . Not on file   Social History Narrative   The PMH, PSH, Social History, Family History, Medications, and allergies have been reviewed in [facility], and have been updated if relevant.   Review of Systems  Constitutional: Negative.   Cardiovascular: Negative.   Musculoskeletal: Negative.   Skin: Negative.   Neurological: Negative.   Hematological: Negative.   Psychiatric/Behavioral: Negative.        Objective:    BP 148/82 mmHg  Pulse 65  Temp(Src) 98.1 F (36.7  C) (Oral)  Wt 187 lb 12 oz (85.163 kg)  SpO2 97%   Physical Exam  Constitutional: He is oriented to person, place, and time. He appears well-developed and well-nourished. No distress.  HENT:  Head: Normocephalic.  Eyes: Conjunctivae are normal.  Neck: Normal range of motion.  Cardiovascular: Normal rate.   Pulmonary/Chest: Effort normal.  Musculoskeletal: He exhibits no edema.  Neurological: He is alert and oriented to person, place, and time. No cranial nerve deficit.  Skin: Skin is warm.  Nursing note and vitals reviewed.         Assessment & Plan:   On home oxygen therapy No Follow-up on file.

## 2014-07-21 NOTE — Discharge Summary (Signed)
PATIENT NAME:  Curtis Campos, Curtis Campos MR#:  [id number] DATE OF BIRTH:  [birth year]  DATE OF ADMISSION:  04/07/2013 DATE OF DISCHARGE:  04/13/2013  PRIMARY CARE PHYSICIAN:  Dr. Arnette Norris.   CARDIOLOGIST:  Dr. Fletcher Anon.  FINAL DIAGNOSES: 1.  Acute respiratory failure, now chronic, pulse ox 87% to 88% on room air at rest.  2.  Chronic obstructive pulmonary disease exacerbation.  3.  Pneumonia.  4. Acute systolic congestive heart failure with cardiomyopathy and moderate mitral regurgitation on transesophageal echocardiogram.  5.  Nonsustained ventricular tachycardia.  6.  Hypertension.  7.  Hyperlipidemia.  8.  Tobacco abuse.   MEDICATIONS ON DISCHARGE: Include lisinopril 40 mg daily, niacin 1000 mg at bedtime, aspirin 81 mg daily, clonazepam 0.5 mg 1 to 3 tablets orally once a day at bedtime as needed for anxiety or nervousness; Remeron 15 mg at bedtime, Zetia 10 mg daily, prednisone taper 5 mg 1 tablet once a day for 3 days, then stop; metoprolol 25 mg twice a day, albuterol CFC 1 inhalation 4 times a day as needed for shortness of breath, Advair Diskus 1 inhalation twice a day, 250/500 mcg; Spiriva 1 inhalation daily, Lasix 40 mg daily, cefuroxime 500 mg 1 tablet every 12 hours for 4 more days and then stop, potassium chloride 20 mEq daily, magnesium oxide 400 mg daily, nicotine patch 14 mg to chest wall daily.   HOME OXYGEN: Yes, 2 liters nasal cannula, portable tank.   DIET: Low-sodium diet, regular consistency.   ACTIVITY: As tolerated.   Follow up with Dr. Fletcher Anon next week, in 1 to 2 weeks with Dr. Arnette Norris. Please stay out of work until followup appointment with Dr. Fletcher Anon.   HOSPITAL COURSE: The patient admitted January 9th with symptoms of shortness of breath and cough.  Admitted with acute respiratory failure; I believe secondary to pneumonia, was started on ceftriaxone and azithromycin. Was started on Solu-Medrol for his COPD exacerbation. Laboratory and radiological data on presentation,  glucose 155, BUN 12, creatinine 0.75, sodium 135, potassium 3.9, chloride 99, CO2 of 27. White blood cell count 6.4, H and H 13.2 and 39.4, platelet count of 247. Troponin negative. EKG: Sinus tachycardia, nonspecific ST-T wave changes. Chest x-ray: Small bilateral pleural effusions, left greater than right; bibasilar airspace disease; infection not excluded, chronic interstitial thickening right upper lobe.  Influenza A and B negative. Blood cultures negative. Ultrasound of the right lower extremity negative. Echo showed EF 25% to 30%, severe mitral valve regurgitation, moderately elevated pulmonary arterial systolic pressure. Cardiac cath on the 12th showed severe one-vessel coronary artery disease with an EF estimated at 25%. Mid circumflex 99% stenosis. TEE done on the 15th showed moderate mitral valve regurgitation is seen, EF 20% to 25%. Creatinine upon discharge 0.8.   HOSPITAL COURSE PER PROBLEM LIST:  1.  For the patient's acute respiratory failure, the patient was on oxygen the entire hospital stay, as high as 6 liters. I did check his room air pulse ox upon discharge. It was 87% to 88% at rest. This would qualify him for oxygen at home, 2 liters nasal cannula with portable tank. He must wear his oxygen 24/7.  2.  Chronic obstructive pulmonary disease exacerbation. The patient was started on Solu-Medrol, Rocephin and Zithromax, tapered down to Ceftin and prednisone taper upon discharge. He completed the Zithromax course here in the hospital. When the patient just came into the hospital, he was not moving any air. Upon discharge, his lungs were clear and better air entry. He  was started on Spiriva, Advair and albuterol inhaler upon discharge also.  3.  Suspected pneumonia. The patient was empirically treated with Rocephin and Zithromax. Finished the course of Zithromax here, switched over to Ceftin upon discharge for completion of course.  4. Acute systolic congestive heart failure with cardiomyopathy  and moderate mitral regurgitation. The patient initially had an echocardiogram that showed severe mitral regurgitation, then a cardiac cath which showed severe one-vessel coronary artery disease. Then, on the day of discharge, had a TEE, which showed moderate mitral regurgitation. Dr. Fletcher Anon stated that this is now medical management instead of a surgical approach, since it is moderate mitral regurgitation on TEE.  Follow up as outpatient with close clinical followup. The patient was on lisinopril from home. We added metoprolol. Once we got the initial echocardiogram report back, he was on IV Lasix and this was switched over to oral Lasix. Again, upon discharge,  his lungs are clear. 5.  For the patient's nonsustained ventricular tachycardia, electrolytes were replaced. This is likely secondary to his cardiomyopathy.  6.  Hypertension.  Blood pressure is stable upon discharge.  7. Hyperlipidemia. He is on numerous cholesterol medications from home. These were continued.  8.  Tobacco abuse. Smoking cessation counseling done during the hospital course. The patient must not smoke with a nicotine patch or with the oxygen.   TIME SPENT ON DISCHARGE: 35 minutes.   ____________________________ Tana Conch. Leslye Peer, MD rjw:dmm D: 04/13/2013 15:41:58 ET T: 04/13/2013 19:25:59 ET JOB#: [id number]  cc: Tana Conch. Leslye Peer, MD, <Dictator> Marciano Sequin. Deborra Medina, MD Mertie Clause. Fletcher Anon, MD Marisue Brooklyn MD ELECTRONICALLY SIGNED 04/16/2013 14:32

## 2014-07-21 NOTE — Consult Note (Signed)
PATIENT NAME:  Curtis Campos, Curtis Campos MR#:  [id number] DATE OF BIRTH:  02/01/1941  DATE OF CONSULTATION:  04/09/2013  REFERRING PHYSICIAN:  Shreyang H. Posey Pronto, MD CONSULTING PHYSICIAN:  Champ Mungo. Lovena Le, MD  INDICATIONS FOR CONSULTATION:  Evaluation of newly-diagnosed congestive heart failure with left ventricular dysfunction and mitral regurgitation.    HISTORY OF PRESENT ILLNESS:  The patient is a very pleasant 74 year old male who has a history of rectal cancer dating back approximately 12 years ago and he has had a colostomy tube since.  The patient also has hypertension, dyslipidemia and a history of anxiety.  Approximately 2 months ago, he developed worsening shortness of breath.  He had been seen on several occasions and diagnosed with COPD and had some mild improvement.  He has noted in the interim peripheral edema.  He presented to the hospital with worsening shortness of breath and cough.  He was initially treated as having a COPD exacerbation and possible pneumonia.  However, he was found to have persistent sinus tachycardia with frequent PVCs and couplets and ventricular ectopy.  Chest x-ray demonstrated cardiomegaly and the patient underwent 2-D echo which demonstrated moderate to severe left ventricular dysfunction with segmental wall motion abnormalities and mitral regurgitation which was fairly severe with a posteriorly-directed jet.  He has pulmonary hypertension by Doppler echo.  He is referred now for additional evaluation.  He has been given IV Lasix with improvement in his symptoms.  He has not had syncope.    PAST SURGICAL HISTORY:  Notable for appendectomy, hernia repair, colostomy after rectal cancer surgery and a history of cataract surgery.    ALLERGIES:  HE HAS A HISTORY OF ALLERGY TO STATIN THERAPY AND CODEINE.    SOCIAL HISTORY:  The patient continues to smoke cigarettes, 1 pack per day, but recently less.  He denies alcohol abuse.   FAMILY HISTORY:  Notable for hypertension,  otherwise negative.  REVIEW OF SYSTEMS:  As noted in the HPI, otherwise all systems negative except as noted above.    PHYSICAL EXAMINATION: GENERAL:  He is a pleasant 74 year old male in no distress. VITAL SIGNS:  Blood pressure was 132/82, pulse was 105 and irregular, respirations were 20 to 22, temperature was 98. HEENT:  Normocephalic, atraumatic.  Pupils equal and round.  Oropharynx is moist.  Sclerae are anicteric. NECK:  Revealed 8 cm jugular venous distension.  There is no thyromegaly.  Trachea was midline.  Carotids are 2+ and symmetric.  The lungs revealed rales in the bases bilaterally, there were scattered wheezes, there were no rhonchi.  CARDIOVASCULAR:  Revealed irregular tachycardia with very soft systolic murmur at the base.  The PMI was enlarged and laterally displaced.   ABDOMEN:  The abdominal exam was soft, nontender, nondistended.  There was no organomegaly.  Ostomy tube is in place. EXTREMITIES:  Demonstrated trace peripheral edema.  There was no evidence of cyanosis or clubbing.   NEUROLOGICAL EXAMINATION:  He was alert and oriented x 3 with cranial nerves intact.  Strength was 5 out of 5 and symmetric, otherwise nonfocal.    LABORATORY, DIAGNOSTIC AND RADIOLOGICAL DATA:  EKG demonstrated sinus tachycardia with frequent PVCs.  Labs were reviewed.    IMPRESSION: 1.  Newly-diagnosed acute systolic heart failure with left ventricular dysfunction. 2.  Newly-diagnosed mitral regurgitation. 3.  Hypertension. 4.  Dyslipidemia.  5.  Ongoing tobacco abuse. 6.  Chronic obstructive lung disease.    DISCUSSION:  The patient has evidence of heart failure and is symptomatic.  I suspect he  may well have had a remote silent MI complicated by left ventricular dysfunction as well as mitral regurgitation.  He will need to undergo left and right heart catheterization.  My partners, Dr. Fletcher Anon and Dr. Rockey Situ, will be here tomorrow to decide on which of these physicians will proceed with the  procedure.  We will make him n.p.o. and will give him some IV Lasix to start with.    ____________________________ Champ Mungo. Lovena Le, MD gwt:cs D: 04/09/2013 14:20:00 ET T: 04/09/2013 19:25:12 ET JOB#: [id number]  cc: Champ Mungo. Lovena Le, MD, <Dictator> Dr. Cristopher Peru, M.D. ELECTRONICALLY SIGNED 05/16/2013 15:47

## 2014-07-21 NOTE — H&P (Signed)
PATIENT NAME:  Curtis Campos, Curtis Campos MR#:  [id number] DATE OF BIRTH:  25-Sep-1940  DATE OF ADMISSION:  04/07/2013  PRIMARY CARE PROVIDER: [facility].  EMERGENCY DEPARTMENT REFERRING PHYSICIAN: Dr. Jimmye Norman.   CHIEF COMPLAINT: Shortness of breath, cough.   HISTORY OF PRESENT ILLNESS: The patient is a 74 year old white male with history of anxiety disorder, rectal cancer, status post colostomy, hypertension, hyperlipidemia, persistent nicotine abuse, who states that he has been having cough and shortness of breath since November. He has been seen by his primary care provider, and also has had difficulty with sleeping so has been treated with antianxiety medications. He was treated with antibiotics orally about a month ago. However, he continues to have this cough, shortness of breath, which has progressively gotten worse. The patient also has been wheezing. He has no diagnosis of COPD. Has not had any fevers or chills. Denies any body aches. Denies any and chest pains or palpitations. Denies any syncope. Denies any nausea, vomiting or diarrhea. Denies any urinary frequency, urgency or hesitancy.   PAST MEDICAL HISTORY:  1.  Anxiety disorder. 2.  History of rectal cancer, status post colostomy.  3.  Hypertension.  4.  Hyperlipidemia.   PAST SURGICAL HISTORY: Status post appendectomy, status post umbilical hernia repair, status post colostomy bag, status post cataract surgery.  ALLERGIES: STATIN AND CODEINE.   CURRENT MEDICATIONS: He is on aspirin 81 mg 1 tab p.o. daily, clonazepam 0.5 mg 1 to 3 tabs at bedtime as needed for anxiety, ezetimibe 10 mg daily, lisinopril 40 daily mirtazapine 15 at bedtime, niacin 1000 mg 1 tab p.o. at bedtime, ProAir 2 puffs q.6 p.r.n. for shortness of breath.   SOCIAL HISTORY: Continues to smoke. He was smoking 1 pack per day, now is smoking only a few cigarettes.   FAMILY HISTORY: Positive for hypertension.   REVIEW OF SYSTEMS:    CONSTITUTIONAL:  Denies any fevers. Complains of fatigue, weakness. No pain. No weight loss. No weight gain.  EYES: No blurred or double vision. No pain. No redness. No inflammation. No glaucoma. No cataracts.  EARS, NOSE, THROAT: No tinnitus. No ear pain. No hearing loss. No seasonal or year-round allergies. No epistaxis. No nasal discharge. No difficulty swallowing.  RESPIRATORY: Complains of cough, wheezing. No hemoptysis. No diagnosis of COPD.  CARDIOVASCULAR: Denies any chest pain, orthopnea, edema,  GASTROINTESTINAL: No nausea, vomiting, diarrhea. No abdominal pain. No hematemesis. No melena. No ulcer.  GENITOURINARY: Denies any dysuria, hematuria, renal calculus or frequency.  ENDOCRINE: Denies any polyuria, nocturia or thyroid problems.  HEMATOLOGIC AND LYMPHATIC: Denies anemia, easy bruisability or bleeding.  SKIN: No acne. No rash. No changes in mole, hair or skin.  MUSCULOSKELETAL: Denies any pain in the neck, back or shoulder.  NEUROLOGIC: No numbness. No CVA. No TIA.  PSYCHIATRIC: Complains of anxiety, insomnia.   PHYSICAL EXAMINATION: VITAL SIGNS: Temperature 98, pulse 113, respirations 20, blood pressure 123/73, O2 sat 87% on room air.  GENERAL: The patient is a well-developed white male in mild distress due to his respiratory issues.  HEENT: Head atraumatic, normocephalic. Pupils equally round, reactive to light and accommodation. There is no conjunctival pallor. No scleral icterus. Nasal exam shows no drainage or ulceration. Oropharynx is clear without any exudate.  NECK: Supple without any JVD.  CARDIOVASCULAR: Regular rate and rhythm. No murmurs, rubs, clicks or gallops. PMI is not displaced.  LUNGS: He has bilateral wheezing throughout both lungs. There are no rales or crackles or rhonchi.  ABDOMEN: Soft, nontender, nondistended.  Positive bowel sounds x 4.  EXTREMITIES: He has right lower extremity swelling greater than left.  SKIN: No rash.  LYMPHATICS: No lymph nodes palpable.   NEUROLOGIC: Awake, alert, oriented x 3. No focal deficits.  PSYCHIATRIC: Not anxious or depressed.  LABORATORY AND RADIOLOGICAL DATA: In the ED, influenza A and B are negative. Chest x-ray shows large left greater than right bibasilar airspace disease, may represent atelectasis or could be infection; chronic interstitial thickening, somewhat more confluent in the right upper lobe. WBC 6.4, hemoglobin 13.2, platelet count 247. Troponin 0.04. Glucose 155, BUN 12, creatinine 0.75, sodium 135, potassium 3.9, chloride 99; CO2 is 8.9.   ASSESSMENT AND PLAN: The patient is a 74 year old white male with a history of smoking, rectal cancer, presents with cough, shortness of breath, wheezing,  1.  Acute respiratory failure: Likely due to community-acquired pneumonia. Has been treated with Levaquin as an outpatient. Will treat with ceftriaxone, azithromycin. Will also get sputum cultures. Also likely has chronic obstructive pulmonary disease, which is a new diagnosis, which we will treat.  2.  Likely chronic obstructive pulmonary disease exacerbation: Will treat him with albuterol, Atrovent nebulizers q.6 hours, IV Solu-Medrol, and start him on Spiriva and Advair.  3.  Hypertension: Continue lisinopril as taking at home.  4.  Hyperlipidemia: Continue ezetimibe.  5.  Nicotine addiction: Smoking cessation 4 minutes done.  6.  The patient has right lower extremity swelling greater than left, so I will do an ultrasound of his lower extremity to rule out deep vein thrombosis.  7.  Miscellaneous: Will place him on heparin for deep venous thrombosis. For nicotine addiction, the patient was counseled regarding smoking cessation, 4 minutes spent. Also recommended  patient to stop smoking. He wants to try a nicotine patch while in the hospital.    TIME SPENT: 50 minutes.   ____________________________ Lafonda Mosses Posey Pronto, MD shp:jcm D: 04/07/2013 17:07:31 ET T: 04/07/2013 17:43:48 ET JOB#: [id number]  cc: Arnetia Bronk H.  Posey Pronto, MD, <Dictator> Alric Seton MD ELECTRONICALLY SIGNED 04/21/2013 17:09

## 2014-07-21 NOTE — Consult Note (Signed)
Brief Consult Note: Diagnosis: acute systolic heart failure/newly diagnosed mitral regurgitation.   Patient was seen by consultant.   Consult note dictated.   Comments: Patient seen and examined. Chart reviewed. Patient admitted with sob and found to have acute on chronic systolic heart failure with mitral regurgitation. His history suggests that he had a silent MI approx. 2 months ago and now has worsening CHF over the past week. He will need left and right heart cath. will diurese. Prognosis guarded. Curtis Campos,M.D.  Electronic Signatures: Curtis Campos (MD)  (Signed 11-Jan-15 14:14)  Authored: Brief Consult Note   Last Updated: 11-Jan-15 14:14 by Curtis Campos (MD)

## 2014-07-30 ENCOUNTER — Other Ambulatory Visit: Payer: Self-pay | Admitting: *Deleted

## 2014-07-30 MED ORDER — CLONAZEPAM 0.5 MG PO TABS: ORAL_TABLET | ORAL | Status: DC

## 2014-07-30 NOTE — Telephone Encounter (Signed)
Rx called in to requested pharmacy. Spoke to pharmacy who states that she request was made once this am, not multiple times.

## 2014-07-30 NOTE — Telephone Encounter (Signed)
Last f/u appt 01/2014-CPE. Pts wife indicates pharmacy has made multiple request. No electronic submissions received and no paper refills received either. pls advise

## 2014-08-28 ENCOUNTER — Other Ambulatory Visit: Payer: Self-pay | Admitting: Family Medicine

## 2014-09-11 ENCOUNTER — Other Ambulatory Visit: Payer: Self-pay | Admitting: *Deleted

## 2014-09-11 MED ORDER — CLONAZEPAM 0.5 MG PO TABS: ORAL_TABLET | ORAL | Status: DC

## 2014-09-11 NOTE — Telephone Encounter (Signed)
Rx called in to requested pharmacy 

## 2014-09-11 NOTE — Telephone Encounter (Signed)
Last f/u appt 01/2014 

## 2014-09-24 ENCOUNTER — Ambulatory Visit (INDEPENDENT_AMBULATORY_CARE_PROVIDER_SITE_OTHER): Payer: BLUE CROSS/BLUE SHIELD | Admitting: Internal Medicine

## 2014-09-24 ENCOUNTER — Encounter: Payer: Self-pay | Admitting: Internal Medicine

## 2014-09-24 VITALS — BP 128/78 | HR 80 | Temp 98.4°F | Wt 182.5 lb

## 2014-09-24 DIAGNOSIS — H6123 Impacted cerumen, bilateral: Secondary | ICD-10-CM | POA: Diagnosis not present

## 2014-09-24 DIAGNOSIS — I2510 Atherosclerotic heart disease of native coronary artery without angina pectoris: Secondary | ICD-10-CM

## 2014-09-24 NOTE — Patient Instructions (Signed)
Cerumen Impaction °A cerumen impaction is when the wax in your ear forms a plug. This plug usually causes reduced hearing. Sometimes it also causes an earache or dizziness. Removing a cerumen impaction can be difficult and painful. The wax sticks to the ear canal. The canal is sensitive and bleeds easily. If you try to remove a heavy wax buildup with a cotton tipped swab, you may push it in further. °Irrigation with water, suction, and small ear curettes may be used to clear out the wax. If the impaction is fixed to the skin in the ear canal, ear drops may be needed for a few days to loosen the wax. People who build up a lot of wax frequently can use ear wax removal products available in your local drugstore. °SEEK MEDICAL CARE IF:  °You develop an earache, increased hearing loss, or marked dizziness. °Document Released: 04/23/2004 Document Revised: 06/08/2011 Document Reviewed: 06/13/2009 °[employer]® Patient Information ©2015 [employer]. This information is not intended to replace advice given to you by your health care provider. Make sure you discuss any questions you have with your health care provider. ° °

## 2014-09-24 NOTE — Progress Notes (Signed)
Pre visit review using our clinic review tool, if applicable. No additional management support is needed unless otherwise documented below in the visit note. 

## 2014-09-24 NOTE — Progress Notes (Addendum)
Subjective:    Patient ID: Curtis Campos, male    DOB: October 23, 1940, 74 y.o.   MRN: [id number]  HPI  Pt presents to the clinic today with c/o right ear fullness He reports this started 2 weeks ago. He thinks it is wax buildup. He has noticed some decreased hearing as well. He denies ear pain or ringing in the ears.  He has washed it out twice without any relief. He denies any other URI symptoms.  Review of Systems      Past Medical History  Diagnosis Date  . Hyperlipidemia   . Arthritis 03/12/2011  . Renal cancer   . Rectal cancer   . Hypertension   . CHF (congestive heart failure)   . Chronic obstructive lung disease   . Mitral regurgitation   . Coronary artery disease 03/2013    EF of 25%, moderate to severe mitral regurgitation. Catheterization showed normal cardiac output with mild pulmonary hypertension. There was 99% stenosis in the mid left circumflex which appeared to be chronic with akinetic basal inferior/posterior wall. Mild LAD and RCA disease.  . Chronic systolic heart failure 83/1517    EF of 25% with moderate to severe mitral regurgitation    Current Outpatient Prescriptions  Medication Sig Dispense Refill  . albuterol (PROVENTIL HFA;VENTOLIN HFA) 108 (90 BASE) MCG/ACT inhaler Inhale 2 puffs into the lungs every 6 (six) hours as needed for wheezing or shortness of breath. 1 Inhaler 2  . aspirin 81 MG tablet Take 81 mg by mouth daily. Takes at night     . clonazePAM (KLONOPIN) 0.5 MG tablet TAKE UP TO 1.5 TABLETS BY MOUTH AT BEDTIME AS NEEDED 45 tablet 0  . furosemide (LASIX) 20 MG tablet Take 1 tablet (20 mg total) by mouth daily. 90 tablet 3  . lisinopril (PRINIVIL,ZESTRIL) 20 MG tablet Take 1 tablet (20 mg total) by mouth daily. 90 tablet 3  . magnesium oxide (MAG-OX) 400 MG tablet Take 1 tablet (400 mg total) by mouth daily. 90 tablet 3  . metoprolol succinate (TOPROL-XL) 100 MG 24 hr tablet Take 1 tablet (100 mg total) by mouth daily. Take with or immediately  following a meal. 90 tablet 3  . niacin (NIASPAN) 1000 MG CR tablet TAKE 1 TABLET AT BEDTIME. 30 tablet 5  . spironolactone (ALDACTONE) 25 MG tablet Take 1 tablet (25 mg total) by mouth daily. 90 tablet 3  . ZETIA 10 MG tablet TAKE 1 TABLET ONCE DAILY 30 tablet 11   No current facility-administered medications for this visit.    Allergies  Allergen Reactions  . Codeine     REACTION: nausea  . Statins     REACTION: maylgias    Family History  Problem Relation Age of Onset  . Heart disease Mother   . Heart disease Father   . Hyperlipidemia Father   . Hypertension Father   . Heart attack Brother 62    MI  . Heart disease Sister     stents placed   . Heart disease Sister     CABG    History   Social History  . Marital Status: Married    Spouse Name: N/A  . Number of Children: N/A  . Years of Education: N/A   Occupational History  . Not on file.   Social History Main Topics  . Smoking status: Former Smoker -- 1.00 packs/day for 50 years    Types: Cigarettes    Quit date: 03/25/2013  . Smokeless tobacco: Never Used  .  Alcohol Use: No  . Drug Use: No  . Sexual Activity: Yes   Other Topics Concern  . Not on file   Social History Narrative     Constitutional: Denies fever, malaise, fatigue, headache or abrupt weight changes.  HEENT: Pt reports right ear fullness. eye pain, eye redness, ear pain, ringing in the ears, runny nose, nasal congestion, bloody nose, or sore throat. Respiratory: Denies difficulty breathing, shortness of breath, cough or sputum production.   Cardiovascular: Denies chest pain, chest tightness, palpitations or swelling in the hands or feet.  Neurological: Denies dizziness, or problems with balance and coordination.   No other specific complaints in a complete review of systems (except as listed in HPI above).  Objective:   Physical Exam   BP 128/78 mmHg  Pulse 80  Temp(Src) 98.4 F (36.9 C) (Oral)  Wt 182 lb 8 oz (82.781 kg)  SpO2  94% Wt Readings from Last 3 Encounters:  09/24/14 182 lb 8 oz (82.781 kg)  06/27/14 187 lb 12 oz (85.163 kg)  04/24/14 184 lb (83.462 kg)    General: Appears his stated age, well developed, well nourished in NAD. HEENT: Head: normal shape and size; Ears: bilateral cerumen impaction;  Cardiovascular: Normal rate and rhythm. S1,S2 noted.   Pulmonary/Chest: Normal effort and positive vesicular breath sounds. No respiratory distress. No wheezes, rales or ronchi noted.  Neurological: Alert and oriented.    BMET    Component Value Date/Time   NA 134* 03/12/2014 1148   NA 133* 01/29/2014 1647   NA 133* 05/26/2013 1043   NA 132* 04/13/2013 0421   NA 142 03/01/2009 1331   K 4.5 03/12/2014 1148   K 5.0 01/29/2014 1647   K 4.6 04/13/2013 0421   K 4.5 03/01/2009 1331   CL 98 01/29/2014 1647   CL 94* 04/13/2013 0421   CL 101 03/11/2012 1404   CL 99 03/01/2009 1331   CO2 23 03/12/2014 1148   CO2 31 01/29/2014 1647   CO2 38* 04/13/2013 0421   CO2 30 03/01/2009 1331   GLUCOSE 106 03/12/2014 1148   GLUCOSE 74 01/29/2014 1647   GLUCOSE 131* 05/26/2013 1043   GLUCOSE 88 04/13/2013 0421   GLUCOSE 107* 03/11/2012 1404   GLUCOSE 90 03/01/2009 1331   BUN 13.6 03/12/2014 1148   BUN 17 01/29/2014 1647   BUN 14 05/26/2013 1043   BUN 27* 04/13/2013 0421   BUN 15 03/01/2009 1331   CREATININE 0.8 03/12/2014 1148   CREATININE 0.9 01/29/2014 1647   CREATININE 0.86 04/13/2013 0421   CREATININE 0.7 03/01/2009 1331   CALCIUM 9.3 03/12/2014 1148   CALCIUM 9.3 01/29/2014 1647   CALCIUM 8.6 04/13/2013 0421   CALCIUM 9.2 03/01/2009 1331   GFRNONAA 86 05/26/2013 1043   GFRNONAA >60 04/13/2013 0421   GFRAA 100 05/26/2013 1043   GFRAA >60 04/13/2013 0421    Lipid Panel     Component Value Date/Time   CHOL 222* 01/29/2014 1647   TRIG 102.0 01/29/2014 1647   HDL 63.50 01/29/2014 1647   CHOLHDL 3 01/29/2014 1647   VLDL 20.4 01/29/2014 1647   LDLCALC 138* 01/29/2014 1647    CBC      Component Value Date/Time   WBC 11.0* 03/12/2014 1148   WBC 9.1 01/29/2014 1647   WBC 6.0 04/27/2013 1015   WBC 6.7 04/08/2013 0559   RBC 3.94* 03/12/2014 1148   RBC 3.89* 01/29/2014 1647   RBC 4.69 04/27/2013 1015   RBC 4.63 04/08/2013 0559  RBC 4.42 03/01/2009 1331   HGB 12.0* 03/12/2014 1148   HGB 12.1* 01/29/2014 1647   HGB 14.2 04/08/2013 0559   HGB 14.0 03/01/2009 1331   HCT 37.1* 03/12/2014 1148   HCT 36.6* 01/29/2014 1647   HCT 42.9 04/08/2013 0559   HCT 41.2 03/01/2009 1331   PLT 275 03/12/2014 1148   PLT 264.0 01/29/2014 1647   PLT 283 04/08/2013 0559   PLT 316 03/01/2009 1331   MCV 94.3 03/12/2014 1148   MCV 94.1 01/29/2014 1647   MCV 93 04/08/2013 0559   MCV 93 03/01/2009 1331   MCH 30.5 03/12/2014 1148   MCH 30.1 04/27/2013 1015   MCH 30.6 04/08/2013 0559   MCH 31.7 03/01/2009 1331   MCHC 32.4 03/12/2014 1148   MCHC 33.0 01/29/2014 1647   MCHC 33.8 04/27/2013 1015   MCHC 33.1 04/08/2013 0559   MCHC 34.0 03/01/2009 1331   RDW 13.3 03/12/2014 1148   RDW 14.1 01/29/2014 1647   RDW 14.7 04/27/2013 1015   RDW 15.2* 04/08/2013 0559   RDW 11.9 03/01/2009 1331   LYMPHSABS 1.8 03/12/2014 1148   LYMPHSABS 2.2 01/29/2014 1647   LYMPHSABS 1.6 04/27/2013 1015   LYMPHSABS 0.4* 04/08/2013 0559   LYMPHSABS 1.9 03/01/2009 1331   MONOABS 1.0* 03/12/2014 1148   MONOABS 0.9 01/29/2014 1647   MONOABS 0.2 04/08/2013 0559   EOSABS 0.2 03/12/2014 1148   EOSABS 0.3 01/29/2014 1647   EOSABS 0.0 04/08/2013 0559   EOSABS 0.2 03/01/2009 1331   BASOSABS 0.1 03/12/2014 1148   BASOSABS 0.1 01/29/2014 1647   BASOSABS 0.1 04/27/2013 1015   BASOSABS 0.0 04/08/2013 0559   BASOSABS 0.1 03/01/2009 1331    Hgb A1C No results found for: HGBA1C      Assessment & Plan:   Bilateral Cerumen Impaction:  Ears lavaged by CMA Try Debrox OTC 2 x week to prevent wax buildup  RTC as needed or if symptoms persist or worsen

## 2014-10-06 ENCOUNTER — Emergency Department (HOSPITAL_COMMUNITY)
Admission: EM | Admit: 2014-10-06 | Discharge: 2014-10-06 | Disposition: A | Discharge status: Home / Self Care | Payer: BLUE CROSS/BLUE SHIELD | Attending: Emergency Medicine | Admitting: Emergency Medicine

## 2014-10-06 ENCOUNTER — Emergency Department (HOSPITAL_COMMUNITY): Payer: BLUE CROSS/BLUE SHIELD

## 2014-10-06 ENCOUNTER — Encounter (HOSPITAL_COMMUNITY): Payer: Self-pay | Admitting: *Deleted

## 2014-10-06 DIAGNOSIS — Y998 Other external cause status: Secondary | ICD-10-CM | POA: Diagnosis not present

## 2014-10-06 DIAGNOSIS — Z79899 Other long term (current) drug therapy: Secondary | ICD-10-CM | POA: Insufficient documentation

## 2014-10-06 DIAGNOSIS — Z85528 Personal history of other malignant neoplasm of kidney: Secondary | ICD-10-CM | POA: Insufficient documentation

## 2014-10-06 DIAGNOSIS — Z7982 Long term (current) use of aspirin: Secondary | ICD-10-CM | POA: Diagnosis not present

## 2014-10-06 DIAGNOSIS — S61511A Laceration without foreign body of right wrist, initial encounter: Secondary | ICD-10-CM | POA: Diagnosis not present

## 2014-10-06 DIAGNOSIS — Y9389 Activity, other specified: Secondary | ICD-10-CM | POA: Insufficient documentation

## 2014-10-06 DIAGNOSIS — Y9289 Other specified places as the place of occurrence of the external cause: Secondary | ICD-10-CM | POA: Diagnosis not present

## 2014-10-06 DIAGNOSIS — S299XXA Unspecified injury of thorax, initial encounter: Secondary | ICD-10-CM | POA: Insufficient documentation

## 2014-10-06 DIAGNOSIS — I5022 Chronic systolic (congestive) heart failure: Secondary | ICD-10-CM | POA: Diagnosis not present

## 2014-10-06 DIAGNOSIS — S0101XA Laceration without foreign body of scalp, initial encounter: Secondary | ICD-10-CM | POA: Insufficient documentation

## 2014-10-06 DIAGNOSIS — I2510 Atherosclerotic heart disease of native coronary artery without angina pectoris: Secondary | ICD-10-CM | POA: Insufficient documentation

## 2014-10-06 DIAGNOSIS — Z9889 Other specified postprocedural states: Secondary | ICD-10-CM | POA: Insufficient documentation

## 2014-10-06 DIAGNOSIS — I10 Essential (primary) hypertension: Secondary | ICD-10-CM | POA: Insufficient documentation

## 2014-10-06 DIAGNOSIS — M1990 Unspecified osteoarthritis, unspecified site: Secondary | ICD-10-CM | POA: Diagnosis not present

## 2014-10-06 DIAGNOSIS — Z8639 Personal history of other endocrine, nutritional and metabolic disease: Secondary | ICD-10-CM | POA: Diagnosis not present

## 2014-10-06 DIAGNOSIS — W108XXA Fall (on) (from) other stairs and steps, initial encounter: Secondary | ICD-10-CM | POA: Diagnosis not present

## 2014-10-06 DIAGNOSIS — Z87891 Personal history of nicotine dependence: Secondary | ICD-10-CM | POA: Diagnosis not present

## 2014-10-06 DIAGNOSIS — J449 Chronic obstructive pulmonary disease, unspecified: Secondary | ICD-10-CM | POA: Insufficient documentation

## 2014-10-06 DIAGNOSIS — Z9849 Cataract extraction status, unspecified eye: Secondary | ICD-10-CM | POA: Diagnosis not present

## 2014-10-06 DIAGNOSIS — S0181XA Laceration without foreign body of other part of head, initial encounter: Secondary | ICD-10-CM | POA: Diagnosis not present

## 2014-10-06 DIAGNOSIS — S0990XA Unspecified injury of head, initial encounter: Secondary | ICD-10-CM | POA: Diagnosis present

## 2014-10-06 DIAGNOSIS — W19XXXA Unspecified fall, initial encounter: Secondary | ICD-10-CM

## 2014-10-06 DIAGNOSIS — S199XXA Unspecified injury of neck, initial encounter: Secondary | ICD-10-CM | POA: Diagnosis not present

## 2014-10-06 DIAGNOSIS — Z85048 Personal history of other malignant neoplasm of rectum, rectosigmoid junction, and anus: Secondary | ICD-10-CM | POA: Diagnosis not present

## 2014-10-06 NOTE — ED Notes (Signed)
Pt fell down basement steps landed on his back and hit head on floor. No LOC. Takes 1 baby ASA daily. No other blood thinners.

## 2014-10-06 NOTE — ED Notes (Signed)
Discharge instructions given with follow up and return instructions given for staple removal. pt demonstrated teach back and verbal understanding. No concerns voiced.

## 2014-10-06 NOTE — ED Provider Notes (Signed)
CSN: [id number]     Arrival date & time 10/06/14  1942 History  This chart was scribed for Nat Christen, MD by Irene Pap, ED Scribe. This patient was seen in room APA02/APA02 and patient care was started at 8:28 PM.   Chief Complaint  Patient presents with  . Head Laceration  . Fall   The history is provided by the patient. No language interpreter was used.  HPI Comments: Curtis Campos is a 74 y.o. male who presents to the Emergency Department complaining of a head laceration onset PTA. Pt fell down 15 steps and landed on carpet, hitting the back of his head and causing a laceration. Reports associated laceration on right wrist and mild right rib pain. Pt denies dizziness, neck pain, and LOC. Wife states that the pt's behavior has not changed since the accident and that she cleaned the wound. Pt reports taking aspirin daily but denies any other blood thinners.  Past Medical History  Diagnosis Date  . Hyperlipidemia   . Arthritis 03/12/2011  . Renal cancer   . Rectal cancer   . Hypertension   . CHF (congestive heart failure)   . Chronic obstructive lung disease   . Mitral regurgitation   . Coronary artery disease 03/2013    EF of 25%, moderate to severe mitral regurgitation. Catheterization showed normal cardiac output with mild pulmonary hypertension. There was 99% stenosis in the mid left circumflex which appeared to be chronic with akinetic basal inferior/posterior wall. Mild LAD and RCA disease.  . Chronic systolic heart failure 35/3614    EF of 25% with moderate to severe mitral regurgitation   Past Surgical History  Procedure Laterality Date  . Appendectomy  06/2003  . Resection of rectum    . Hernia repair    . Colostomy    . Cataract surgery    . Cardiac catheterization  03/2013    [facility]   Family History  Problem Relation Age of Onset  . Heart disease Mother   . Heart disease Father   . Hyperlipidemia Father   . Hypertension Father   . Heart attack Brother 49     MI  . Heart disease Sister     stents placed   . Heart disease Sister     CABG   History  Substance Use Topics  . Smoking status: Former Smoker -- 1.00 packs/day for 50 years    Types: Cigarettes    Quit date: 03/25/2013  . Smokeless tobacco: Never Used  . Alcohol Use: No    Review of Systems  A complete 10 system review of systems was obtained and all systems are negative except as noted in the HPI and PMH.   Allergies  Codeine and Statins  Home Medications   Prior to Admission medications   Medication Sig Start Date End Date Taking? Authorizing Provider  albuterol (PROVENTIL HFA;VENTOLIN HFA) 108 (90 BASE) MCG/ACT inhaler Inhale 2 puffs into the lungs every 6 (six) hours as needed for wheezing or shortness of breath. 02/07/13  Yes Tonia Ghent, MD  aspirin 81 MG tablet Take 81 mg by mouth daily. Takes at night    Yes Historical Provider, MD  clonazePAM (KLONOPIN) 0.5 MG tablet TAKE UP TO 1.5 TABLETS BY MOUTH AT BEDTIME AS NEEDED Patient taking differently: Take 0.5-1.5 mg by mouth at bedtime as needed for anxiety (or sleep).  09/11/14  Yes Lucille Passy, MD  furosemide (LASIX) 20 MG tablet Take 1 tablet (20 mg total) by mouth  daily. 04/24/14  Yes Wellington Hampshire, MD  lisinopril (PRINIVIL,ZESTRIL) 20 MG tablet Take 1 tablet (20 mg total) by mouth daily. 04/24/14  Yes Wellington Hampshire, MD  magnesium oxide (MAG-OX) 400 MG tablet Take 1 tablet (400 mg total) by mouth daily. 04/27/14  Yes Wellington Hampshire, MD  metoprolol succinate (TOPROL-XL) 100 MG 24 hr tablet Take 1 tablet (100 mg total) by mouth daily. Take with or immediately following a meal. 04/24/14  Yes Wellington Hampshire, MD  niacin (NIASPAN) 1000 MG CR tablet TAKE 1 TABLET AT BEDTIME. 08/28/14  Yes Lucille Passy, MD  spironolactone (ALDACTONE) 25 MG tablet Take 1 tablet (25 mg total) by mouth daily. 04/24/14  Yes Wellington Hampshire, MD  ZETIA 10 MG tablet TAKE 1 TABLET ONCE DAILY 06/25/14  Yes Lucille Passy, MD   BP 117/58 mmHg   Pulse 70  Temp(Src) 97.7 F (36.5 C) (Oral)  Resp 20  Ht '5\' 6"'$  (1.676 m)  Wt 177 lb (80.287 kg)  BMI 28.58 kg/m2  SpO2 100%  Physical Exam  Constitutional: He is oriented to person, place, and time. He appears well-developed and well-nourished.  HENT:  Head: Normocephalic.  Occipital area has 4.5 cm oblique laceration  Eyes: Conjunctivae and EOM are normal. Pupils are equal, round, and reactive to light.  Neck: Normal range of motion. Neck supple.  Non-tender  Cardiovascular: Normal rate and regular rhythm.   Pulmonary/Chest: Effort normal and breath sounds normal.  Abdominal: Soft. Bowel sounds are normal.  Musculoskeletal: Normal range of motion.  Neurological: He is alert and oriented to person, place, and time.  Skin: Skin is warm and dry.  Psychiatric: He has a normal mood and affect. His behavior is normal.  Nursing note and vitals reviewed.   ED Course  LACERATION REPAIR Date/Time: 10/06/2014 11:04 PM Performed by: Nat Christen Authorized by: Nat Christen Comments: 2200:  Wound was cleaned with normal saline. Hair edges trimmed. No foreign body noted. Staples 7. Sterile dressing applied.   (including critical care time) DIAGNOSTIC STUDIES: Oxygen Saturation is 100% on RA, normal by my interpretation.    COORDINATION OF CARE: 8:29 PM-Discussed treatment plan which includes laceration repair and x-ray with pt at bedside and pt agreed to plan.      Labs Review Labs Reviewed - No data to display  Imaging Review Ct Head W Wo Contrast  10/06/2014   CLINICAL DATA:  [place] down 15 steps, landed on carpet, posterior head laceration. History of renal and rectal carcinoma.  EXAM: CT HEAD WITHOUT CONTRAST  CT CERVICAL SPINE WITHOUT CONTRAST  TECHNIQUE: Multidetector CT imaging of the head and cervical spine was performed following the standard protocol without intravenous contrast. Multiplanar CT image reconstructions of the cervical spine were also generated.  COMPARISON:   PET-CT December 03, 2005  FINDINGS: CT HEAD FINDINGS  The ventricles and sulci are normal for age. No intraparenchymal hemorrhage, mass effect nor midline shift. Patchy supratentorial white matter hypodensities are within normal range for patient's age and though non-specific suggest sequelae of chronic small vessel ischemic disease. No acute large vascular territory infarcts.  No abnormal extra-axial fluid collections. Basal cisterns are patent. Moderate calcific atherosclerosis of the carotid siphons.  Small RIGHT parietal occipital scalp hematoma, minimal subcutaneous gas without radiopaque foreign bodies. Overlying skin staples. No skull fracture. The included ocular globes and orbital contents are non-suspicious. Bilateral ocular lens implants. The mastoid aircells and included paranasal sinuses are well-aerated. LEFT preauricular small subcutaneous calcification.  CT  CERVICAL SPINE FINDINGS  Cervical vertebral bodies and posterior elements intact. Minimal grade 1 C4-5 anterolisthesis, minimal grade 1 C5-6 anterolisthesis without spondylolysis. Straightened cervical lordosis. Moderate to severe C3-4, C6-7 disc height loss, uncovertebral hypertrophy and ventral endplate spurring consistent with degenerative discs. Severe RIGHT, moderate LEFT facet arthropathy. Severe RIGHT C2-3, bilateral C3-4, RIGHT C4-5, RIGHT C5-6, moderate to severe bilateral C6-7 neural foraminal narrowing. Moderate to severe LEFT C7-T1 neural foraminal narrowing. Moderate to severe canal stenosis C3-4, mild at C4-5. C1-2 articulation maintained without destructive bony lesions. Nuchal ligament calcifications. Moderate calcific atherosclerosis of the carotid bulbs. Prevertebral soft tissues are nonacute. Fibronodular apical lung scarring.  IMPRESSION: CT HEAD: No acute intracranial process. Involutional changes, moderate white matter changes compatible chronic small vessel ischemic disease.  Small RIGHT parietal occipital scalp hematoma  with hand laceration. No skull fracture.  CT CERVICAL SPINE: Straightened cervical lordosis without acute fracture. Grade 1 C4-5 and C5-6 anterolisthesis on degenerative basis.  Moderate to severe canal stenosis at C3-4. Multilevel severe neural foraminal narrowing.   Electronically Signed   By: Elon Alas M.D.   On: 10/06/2014 22:00   Ct Cervical Spine Wo Contrast  10/06/2014   CLINICAL DATA:  [place] down 15 steps, landed on carpet, posterior head laceration. History of renal and rectal carcinoma.  EXAM: CT HEAD WITHOUT CONTRAST  CT CERVICAL SPINE WITHOUT CONTRAST  TECHNIQUE: Multidetector CT imaging of the head and cervical spine was performed following the standard protocol without intravenous contrast. Multiplanar CT image reconstructions of the cervical spine were also generated.  COMPARISON:  PET-CT December 03, 2005  FINDINGS: CT HEAD FINDINGS  The ventricles and sulci are normal for age. No intraparenchymal hemorrhage, mass effect nor midline shift. Patchy supratentorial white matter hypodensities are within normal range for patient's age and though non-specific suggest sequelae of chronic small vessel ischemic disease. No acute large vascular territory infarcts.  No abnormal extra-axial fluid collections. Basal cisterns are patent. Moderate calcific atherosclerosis of the carotid siphons.  Small RIGHT parietal occipital scalp hematoma, minimal subcutaneous gas without radiopaque foreign bodies. Overlying skin staples. No skull fracture. The included ocular globes and orbital contents are non-suspicious. Bilateral ocular lens implants. The mastoid aircells and included paranasal sinuses are well-aerated. LEFT preauricular small subcutaneous calcification.  CT CERVICAL SPINE FINDINGS  Cervical vertebral bodies and posterior elements intact. Minimal grade 1 C4-5 anterolisthesis, minimal grade 1 C5-6 anterolisthesis without spondylolysis. Straightened cervical lordosis. Moderate to severe C3-4, C6-7 disc  height loss, uncovertebral hypertrophy and ventral endplate spurring consistent with degenerative discs. Severe RIGHT, moderate LEFT facet arthropathy. Severe RIGHT C2-3, bilateral C3-4, RIGHT C4-5, RIGHT C5-6, moderate to severe bilateral C6-7 neural foraminal narrowing. Moderate to severe LEFT C7-T1 neural foraminal narrowing. Moderate to severe canal stenosis C3-4, mild at C4-5. C1-2 articulation maintained without destructive bony lesions. Nuchal ligament calcifications. Moderate calcific atherosclerosis of the carotid bulbs. Prevertebral soft tissues are nonacute. Fibronodular apical lung scarring.  IMPRESSION: CT HEAD: No acute intracranial process. Involutional changes, moderate white matter changes compatible chronic small vessel ischemic disease.  Small RIGHT parietal occipital scalp hematoma with hand laceration. No skull fracture.  CT CERVICAL SPINE: Straightened cervical lordosis without acute fracture. Grade 1 C4-5 and C5-6 anterolisthesis on degenerative basis.  Moderate to severe canal stenosis at C3-4. Multilevel severe neural foraminal narrowing.   Electronically Signed   By: Elon Alas M.D.   On: 10/06/2014 22:00     EKG Interpretation None      MDM   Final diagnoses:  Fall,  initial encounter  Scalp laceration, initial encounter   CT head and cervical spine show no acute injuries. Laceration repair per noted above. Explained findings with patient and his wife. Staples out in one week.  I personally performed the services described in this documentation, which was scribed in my presence. The recorded information has been reviewed and is accurate.    Nat Christen, MD 10/06/14 2312

## 2014-10-06 NOTE — ED Notes (Signed)
Laceration to back of head dressed with Vaseline gauze, nonadherent dressing, 4x4 and coband to secure.

## 2014-10-06 NOTE — ED Notes (Signed)
Fell down 15 steps after losing footing landing on carpeted floor, hitting back of head and causing laceration.   Also has small laceration on R wrist and c/o mild R rib pain. Denies dizziness.

## 2014-10-06 NOTE — Discharge Instructions (Signed)
X-rays showed no fracture. You can shower tonight. Keep head dry. Staples out next Sunday.

## 2014-10-09 ENCOUNTER — Telehealth: Payer: Self-pay | Admitting: Family Medicine

## 2014-10-09 ENCOUNTER — Encounter: Payer: Self-pay | Admitting: Family Medicine

## 2014-10-09 ENCOUNTER — Ambulatory Visit (INDEPENDENT_AMBULATORY_CARE_PROVIDER_SITE_OTHER): Payer: BLUE CROSS/BLUE SHIELD | Admitting: Family Medicine

## 2014-10-09 VITALS — BP 110/74 | HR 87 | Temp 98.0°F | Wt 181.2 lb

## 2014-10-09 DIAGNOSIS — I959 Hypotension, unspecified: Secondary | ICD-10-CM | POA: Insufficient documentation

## 2014-10-09 DIAGNOSIS — I952 Hypotension due to drugs: Secondary | ICD-10-CM | POA: Diagnosis not present

## 2014-10-09 DIAGNOSIS — I2510 Atherosclerotic heart disease of native coronary artery without angina pectoris: Secondary | ICD-10-CM

## 2014-10-09 DIAGNOSIS — S0990XS Unspecified injury of head, sequela: Secondary | ICD-10-CM | POA: Diagnosis not present

## 2014-10-09 DIAGNOSIS — S0990XA Unspecified injury of head, initial encounter: Secondary | ICD-10-CM | POA: Insufficient documentation

## 2014-10-09 MED ORDER — TRAMADOL HCL 50 MG PO TABS: 50.0000 mg | ORAL_TABLET | Freq: Three times a day (TID) | ORAL | Status: DC | PRN

## 2014-10-09 MED ORDER — LISINOPRIL 10 MG PO TABS: 10.0000 mg | ORAL_TABLET | Freq: Every day | ORAL | Status: DC

## 2014-10-09 NOTE — Patient Instructions (Signed)
Good to see you. Please stop taking lisinopril 20 mg daily and start taking Lisinopril 10 mg daily.  Tramadol as needed for pain.  Please check your blood pressure at home and call me with your readings.

## 2014-10-09 NOTE — Progress Notes (Signed)
Pre visit review using our clinic review tool, if applicable. No additional management support is needed unless otherwise documented below in the visit note. 

## 2014-10-09 NOTE — Assessment & Plan Note (Signed)
New- staples not due to come out.  Reassured by appearance.

## 2014-10-09 NOTE — Telephone Encounter (Signed)
Pt has appt with Dr Deborra Medina on 10/09/14 at 12 noon.

## 2014-10-09 NOTE — Telephone Encounter (Signed)
Patient Name: JAHI ROZA  DOB: 1940-11-22    Initial Comment Caller states her husband fell down the stairs backwards- Saturday evening. Went to the ER. He is sore now.   Nurse Assessment  Nurse: Mallie Mussel, RN, Alveta Heimlich Date/Time Eilene Ghazi Time): 10/09/2014 9:54:45 AM  Confirm and document reason for call. If symptomatic, describe symptoms. ---Caller states that her husband fell down stairs on Saturday and was seen in ER for laceration to his head. They did not prescribe him anything for pain, just told him to take Tylenol. He rates his pain as 5 on 0-10 scale. Denies stomach disorders and kidney problems. They have not tried taking IBU.  Has the patient traveled out of the country within the last 30 days? ---No  Does the patient require triage? ---Yes  Related visit to physician within the last 2 weeks? ---Yes  Does the PT have any chronic conditions? (i.e. diabetes, asthma, etc.) ---Yes  List chronic conditions. ---Heart problems, Hypercholesterolemia     Guidelines    Guideline Title Affirmed Question Affirmed Notes  Head Injury [1] After 72 hours AND [2] headache persists    Final Disposition User   See PCP When Office is Open (within 3 days) Mallie Mussel, RN, Alveta Heimlich    Comments  Appointment scheduled for 12:00pm today with Dr. Arnette Norris.   Disagree/Comply: Comply

## 2014-10-09 NOTE — Assessment & Plan Note (Signed)
This seems more likely due to decreased fluid intake and blood pressure rxs. Will decrease lisinopril back to 10 mg daily, continue other meds at current doses. They have BP cuff at home and will keep an eye on it and call me with an update later this week, sooner if symptoms continue or progress. The patient indicates understanding of these issues and agrees with the plan.

## 2014-10-09 NOTE — Progress Notes (Signed)
Subjective:   Patient ID: Curtis Campos, male    DOB: November 22, 1940, 74 y.o.   MRN: [id number]  Curtis Campos is a pleasant 74 y.o. year old male who presents to clinic today with his wife for  Hospitalization Follow-up and Dizziness  on 10/09/2014  HPI:  Was seen 3 days ago in ER after fall at home (down 15 steps) onto carpet that caused a head wound/injury. Note reviewed from 10/06/14.  At that time, denied any dizziness, or LOC.  Head CT and cervical spine CT neg.  Scalp lac repaired with staples and d/c'd home.  Here today because he is a little dizzy and "sore all over."  Scalp actually feels less painful but arms and legs are more sore. Tylenol not helping much.  No blurred vision, speak or behavioral changes.  Does not have a headache today. No syncope or pre syncope.  Ct Head W Wo Contrast  10/06/2014   CLINICAL DATA:  [place] down 15 steps, landed on carpet, posterior head laceration. History of renal and rectal carcinoma.  EXAM: CT HEAD WITHOUT CONTRAST  CT CERVICAL SPINE WITHOUT CONTRAST  TECHNIQUE: Multidetector CT imaging of the head and cervical spine was performed following the standard protocol without intravenous contrast. Multiplanar CT image reconstructions of the cervical spine were also generated.  COMPARISON:  PET-CT December 03, 2005  FINDINGS: CT HEAD FINDINGS  The ventricles and sulci are normal for age. No intraparenchymal hemorrhage, mass effect nor midline shift. Patchy supratentorial white matter hypodensities are within normal range for patient's age and though non-specific suggest sequelae of chronic small vessel ischemic disease. No acute large vascular territory infarcts.  No abnormal extra-axial fluid collections. Basal cisterns are patent. Moderate calcific atherosclerosis of the carotid siphons.  Small RIGHT parietal occipital scalp hematoma, minimal subcutaneous gas without radiopaque foreign bodies. Overlying skin staples. No skull fracture. The included  ocular globes and orbital contents are non-suspicious. Bilateral ocular lens implants. The mastoid aircells and included paranasal sinuses are well-aerated. LEFT preauricular small subcutaneous calcification.  CT CERVICAL SPINE FINDINGS  Cervical vertebral bodies and posterior elements intact. Minimal grade 1 C4-5 anterolisthesis, minimal grade 1 C5-6 anterolisthesis without spondylolysis. Straightened cervical lordosis. Moderate to severe C3-4, C6-7 disc height loss, uncovertebral hypertrophy and ventral endplate spurring consistent with degenerative discs. Severe RIGHT, moderate LEFT facet arthropathy. Severe RIGHT C2-3, bilateral C3-4, RIGHT C4-5, RIGHT C5-6, moderate to severe bilateral C6-7 neural foraminal narrowing. Moderate to severe LEFT C7-T1 neural foraminal narrowing. Moderate to severe canal stenosis C3-4, mild at C4-5. C1-2 articulation maintained without destructive bony lesions. Nuchal ligament calcifications. Moderate calcific atherosclerosis of the carotid bulbs. Prevertebral soft tissues are nonacute. Fibronodular apical lung scarring.  IMPRESSION: CT HEAD: No acute intracranial process. Involutional changes, moderate white matter changes compatible chronic small vessel ischemic disease.  Small RIGHT parietal occipital scalp hematoma with hand laceration. No skull fracture.  CT CERVICAL SPINE: Straightened cervical lordosis without acute fracture. Grade 1 C4-5 and C5-6 anterolisthesis on degenerative basis.  Moderate to severe canal stenosis at C3-4. Multilevel severe neural foraminal narrowing.   Electronically Signed   By: Elon Alas M.D.   On: 10/06/2014 22:00   Ct Cervical Spine Wo Contrast  10/06/2014   CLINICAL DATA:  [place] down 15 steps, landed on carpet, posterior head laceration. History of renal and rectal carcinoma.  EXAM: CT HEAD WITHOUT CONTRAST  CT CERVICAL SPINE WITHOUT CONTRAST  TECHNIQUE: Multidetector CT imaging of the head and cervical spine was performed following the  standard protocol without intravenous contrast. Multiplanar CT image reconstructions of the cervical spine were also generated.  COMPARISON:  PET-CT December 03, 2005  FINDINGS: CT HEAD FINDINGS  The ventricles and sulci are normal for age. No intraparenchymal hemorrhage, mass effect nor midline shift. Patchy supratentorial white matter hypodensities are within normal range for patient's age and though non-specific suggest sequelae of chronic small vessel ischemic disease. No acute large vascular territory infarcts.  No abnormal extra-axial fluid collections. Basal cisterns are patent. Moderate calcific atherosclerosis of the carotid siphons.  Small RIGHT parietal occipital scalp hematoma, minimal subcutaneous gas without radiopaque foreign bodies. Overlying skin staples. No skull fracture. The included ocular globes and orbital contents are non-suspicious. Bilateral ocular lens implants. The mastoid aircells and included paranasal sinuses are well-aerated. LEFT preauricular small subcutaneous calcification.  CT CERVICAL SPINE FINDINGS  Cervical vertebral bodies and posterior elements intact. Minimal grade 1 C4-5 anterolisthesis, minimal grade 1 C5-6 anterolisthesis without spondylolysis. Straightened cervical lordosis. Moderate to severe C3-4, C6-7 disc height loss, uncovertebral hypertrophy and ventral endplate spurring consistent with degenerative discs. Severe RIGHT, moderate LEFT facet arthropathy. Severe RIGHT C2-3, bilateral C3-4, RIGHT C4-5, RIGHT C5-6, moderate to severe bilateral C6-7 neural foraminal narrowing. Moderate to severe LEFT C7-T1 neural foraminal narrowing. Moderate to severe canal stenosis C3-4, mild at C4-5. C1-2 articulation maintained without destructive bony lesions. Nuchal ligament calcifications. Moderate calcific atherosclerosis of the carotid bulbs. Prevertebral soft tissues are nonacute. Fibronodular apical lung scarring.  IMPRESSION: CT HEAD: No acute intracranial process.  Involutional changes, moderate white matter changes compatible chronic small vessel ischemic disease.  Small RIGHT parietal occipital scalp hematoma with hand laceration. No skull fracture.  CT CERVICAL SPINE: Straightened cervical lordosis without acute fracture. Grade 1 C4-5 and C5-6 anterolisthesis on degenerative basis.  Moderate to severe canal stenosis at C3-4. Multilevel severe neural foraminal narrowing.   Electronically Signed   By: Elon Alas M.D.   On: 10/06/2014 22:00   Lisinopril was recently increased by cardiologist to 20 mg from 10 mg daily, also takes several other antihypertensives- toprol xl - 100 mg daily, adlactone 25 mg daily and lasix.  Review of Systems  Constitutional: Negative.   HENT: Negative.   Eyes: Negative.   Cardiovascular: Negative.   Gastrointestinal: Negative.   Endocrine: Negative.   Genitourinary: Negative.   Musculoskeletal: Positive for myalgias, back pain and arthralgias.  Skin: Positive for wound.  Neurological: Positive for light-headedness. Negative for tremors, seizures, syncope, speech difficulty, weakness, numbness and headaches.  Hematological: Negative.   Psychiatric/Behavioral: Negative.   All other systems reviewed and are negative.      Objective:    BP 110/74 mmHg  Pulse 87  Temp(Src) 98 F (36.7 C) (Oral)  Wt 181 lb 4 oz (82.214 kg)  SpO2 97% BP Readings from Last 3 Encounters:  10/09/14 110/74  10/06/14 103/67  09/24/14 128/78     Physical Exam  Constitutional: He is oriented to person, place, and time. He appears well-developed and well-nourished.  HENT:  Head: Normocephalic.  Occipital area has 12 staples in place, c/d/i Eyes: Conjunctivae and EOM are normal. Pupils are equal, round, and reactive to light.  Neck: Normal range of motion. Neck supple.  Non-tender  Cardiovascular: Normal rate and regular rhythm.  Pulmonary/Chest: Effort normal and breath sounds normal.  Abdominal: Soft. Bowel sounds are  normal.  Musculoskeletal: Normal range of motion.  Neurological: He is alert and oriented to person, place, and time.  Skin: Skin is warm and dry.  Multiple abrasions  and ecchymoses on arms and legs bilaterally Psychiatric: He has a normal mood and affect. His behavior is normal.  Nursing note and vitals reviewed.      Assessment & Plan:   Hypotension due to drugs  Head injury, sequela No Follow-up on file.

## 2014-10-14 ENCOUNTER — Encounter (HOSPITAL_COMMUNITY): Payer: Self-pay | Admitting: Emergency Medicine

## 2014-10-14 ENCOUNTER — Emergency Department (HOSPITAL_COMMUNITY)
Admission: EM | Admit: 2014-10-14 | Discharge: 2014-10-14 | Disposition: A | Discharge status: Home / Self Care | Payer: BLUE CROSS/BLUE SHIELD | Attending: Emergency Medicine | Admitting: Emergency Medicine

## 2014-10-14 DIAGNOSIS — I2510 Atherosclerotic heart disease of native coronary artery without angina pectoris: Secondary | ICD-10-CM | POA: Diagnosis not present

## 2014-10-14 DIAGNOSIS — E785 Hyperlipidemia, unspecified: Secondary | ICD-10-CM | POA: Insufficient documentation

## 2014-10-14 DIAGNOSIS — Z7982 Long term (current) use of aspirin: Secondary | ICD-10-CM | POA: Insufficient documentation

## 2014-10-14 DIAGNOSIS — Z79899 Other long term (current) drug therapy: Secondary | ICD-10-CM | POA: Insufficient documentation

## 2014-10-14 DIAGNOSIS — Z9889 Other specified postprocedural states: Secondary | ICD-10-CM | POA: Insufficient documentation

## 2014-10-14 DIAGNOSIS — M1990 Unspecified osteoarthritis, unspecified site: Secondary | ICD-10-CM | POA: Diagnosis not present

## 2014-10-14 DIAGNOSIS — Z85048 Personal history of other malignant neoplasm of rectum, rectosigmoid junction, and anus: Secondary | ICD-10-CM | POA: Insufficient documentation

## 2014-10-14 DIAGNOSIS — Z4802 Encounter for removal of sutures: Secondary | ICD-10-CM | POA: Insufficient documentation

## 2014-10-14 DIAGNOSIS — Z85528 Personal history of other malignant neoplasm of kidney: Secondary | ICD-10-CM | POA: Diagnosis not present

## 2014-10-14 DIAGNOSIS — I10 Essential (primary) hypertension: Secondary | ICD-10-CM | POA: Diagnosis not present

## 2014-10-14 DIAGNOSIS — I5022 Chronic systolic (congestive) heart failure: Secondary | ICD-10-CM | POA: Diagnosis not present

## 2014-10-14 DIAGNOSIS — J449 Chronic obstructive pulmonary disease, unspecified: Secondary | ICD-10-CM | POA: Insufficient documentation

## 2014-10-14 DIAGNOSIS — Z87891 Personal history of nicotine dependence: Secondary | ICD-10-CM | POA: Insufficient documentation

## 2014-10-14 NOTE — ED Provider Notes (Signed)
CSN: [id number]     Arrival date & time 10/14/14  1301 History   This chart was scribed for Lily Kocher, PA-C working with Ripley Fraise, MD by Mercy Moore, ED Scribe. This patient was seen in room APFT23/APFT23 and the patient's care was started at 1:10 PM.   Chief Complaint  Patient presents with  . Suture / Staple Removal   The history is provided by the patient. No language interpreter was used.   HPI Comments: Curtis Campos is a 74 y.o. male who presents to the Emergency Department requesting to have sutures removed from a laceration to occipital scalp sustained during a fall 7/9. Patient's laceration repaired in ED with total of 7 staples. Patient denies complications with the wound or it's healing specifically denying fever, chills, or drainage from the site.   Past Medical History  Diagnosis Date  . Hyperlipidemia   . Arthritis 03/12/2011  . Renal cancer   . Rectal cancer   . Hypertension   . CHF (congestive heart failure)   . Chronic obstructive lung disease   . Mitral regurgitation   . Coronary artery disease 03/2013    EF of 25%, moderate to severe mitral regurgitation. Catheterization showed normal cardiac output with mild pulmonary hypertension. There was 99% stenosis in the mid left circumflex which appeared to be chronic with akinetic basal inferior/posterior wall. Mild LAD and RCA disease.  . Chronic systolic heart failure 78/2956    EF of 25% with moderate to severe mitral regurgitation   Past Surgical History  Procedure Laterality Date  . Appendectomy  06/2003  . Resection of rectum    . Hernia repair    . Colostomy    . Cataract surgery    . Cardiac catheterization  03/2013    [facility]   Family History  Problem Relation Age of Onset  . Heart disease Mother   . Heart disease Father   . Hyperlipidemia Father   . Hypertension Father   . Heart attack Brother 42    MI  . Heart disease Sister     stents placed   . Heart disease Sister     CABG    History  Substance Use Topics  . Smoking status: Former Smoker -- 1.00 packs/day for 50 years    Types: Cigarettes    Quit date: 03/25/2013  . Smokeless tobacco: Never Used  . Alcohol Use: No    Review of Systems  Constitutional: Negative for fever.  Skin: Positive for wound.  All other systems reviewed and are negative.  Allergies  Codeine and Statins  Home Medications   Prior to Admission medications   Medication Sig Start Date End Date Taking? Authorizing Provider  albuterol (PROVENTIL HFA;VENTOLIN HFA) 108 (90 BASE) MCG/ACT inhaler Inhale 2 puffs into the lungs every 6 (six) hours as needed for wheezing or shortness of breath. 02/07/13   Tonia Ghent, MD  aspirin 81 MG tablet Take 81 mg by mouth daily. Takes at night     Historical Provider, MD  clonazePAM (KLONOPIN) 0.5 MG tablet TAKE UP TO 1.5 TABLETS BY MOUTH AT BEDTIME AS NEEDED Patient taking differently: Take 0.5-1.5 mg by mouth at bedtime as needed for anxiety (or sleep).  09/11/14   Lucille Passy, MD  furosemide (LASIX) 20 MG tablet Take 1 tablet (20 mg total) by mouth daily. 04/24/14   Wellington [place], MD  lisinopril (PRINIVIL,ZESTRIL) 10 MG tablet Take 1 tablet (10 mg total) by mouth daily. 10/09/14   Talia  Nicki Reaper, MD  magnesium oxide (MAG-OX) 400 MG tablet Take 1 tablet (400 mg total) by mouth daily. 04/27/14   Wellington [place], MD  metoprolol succinate (TOPROL-XL) 100 MG 24 hr tablet Take 1 tablet (100 mg total) by mouth daily. Take with or immediately following a meal. 04/24/14   Wellington Hampshire, MD  niacin (NIASPAN) 1000 MG CR tablet TAKE 1 TABLET AT BEDTIME. 08/28/14   Lucille Passy, MD  spironolactone (ALDACTONE) 25 MG tablet Take 1 tablet (25 mg total) by mouth daily. 04/24/14   Wellington Hampshire, MD  traMADol (ULTRAM) 50 MG tablet Take 1 tablet (50 mg total) by mouth every 8 (eight) hours as needed. 10/09/14   Lucille Passy, MD  ZETIA 10 MG tablet TAKE 1 TABLET ONCE DAILY 06/25/14   Lucille Passy, MD   Triage  Vitals: BP 117/60 mmHg  Pulse 90  Temp(Src) 98.1 F (36.7 C) (Oral)  Resp 18  Ht '5\' 6"'$  (1.676 m)  Wt 181 lb (82.101 kg)  BMI 29.23 kg/m2  SpO2 100% Physical Exam  Constitutional: He is oriented to person, place, and time. He appears well-developed and well-nourished. No distress.  HENT:  Head: Normocephalic and atraumatic.  Stapled laceration to occipital area of scalp. No hematoma present. No evidence of abscess. No palpable lymph nodes. Wound progressing nicely. Wound is intact.   Eyes: EOM are normal.  Neck: Neck supple. No tracheal deviation present.  Cardiovascular: Normal rate.   Pulmonary/Chest: Effort normal. No respiratory distress.  Musculoskeletal: Normal range of motion.  Neurological: He is alert and oriented to person, place, and time. No cranial nerve deficit.  Skin: Skin is warm and dry.  Psychiatric: He has a normal mood and affect. His behavior is normal.  Nursing note and vitals reviewed.   ED Course Pt seen with me by Dr Christy Gentles.  Procedures (including critical care time)  COORDINATION OF CARE: 1:13 PM- Discussed treatment plan with patient at bedside and patient agreed to plan.   Labs Review Labs Reviewed - No data to display  Imaging Review No results found.   EKG Interpretation None      MDM  Staples removed without problem. No signs of infection. No bleeding or drainage. No gross neuro deficits. Pt ambulatory without problem Safe for pt to be d/c home. He will return if any changes or prblem.   Final diagnoses:  None    *I have reviewed nursing notes, vital signs, and all appropriate lab and imaging results for this patient.**  **I personally performed the services described in this documentation, which was scribed in my presence. The recorded information has been reviewed and is accurate.Lily Kocher, PA-C 10/24/14 1226  Ripley Fraise, MD 10/25/14 [id number]  Ripley Fraise, MD 11/11/14 [phone]

## 2014-10-14 NOTE — Discharge Instructions (Signed)

## 2014-10-14 NOTE — ED Notes (Signed)
Pt here for staple removal from head.

## 2014-10-14 NOTE — ED Provider Notes (Signed)
Patient seen/examined in the Emergency Department in conjunction with Midlevel Provider [facility] Patient presents for staple removal Exam : awake/alert.  Wound is healing well without signs of infection Plan: remove staples and d/c home    Ripley Fraise, MD 10/14/14 1327

## 2014-10-23 ENCOUNTER — Other Ambulatory Visit: Payer: Self-pay | Admitting: *Deleted

## 2014-10-23 MED ORDER — CLONAZEPAM 0.5 MG PO TABS: 0.5000 mg | ORAL_TABLET | Freq: Every evening | ORAL | Status: DC | PRN

## 2014-10-23 NOTE — Telephone Encounter (Signed)
Last f/u appt 01/2014 

## 2014-10-23 NOTE — Telephone Encounter (Signed)
Rx called in to requested pharmacy 

## 2014-11-20 ENCOUNTER — Ambulatory Visit (INDEPENDENT_AMBULATORY_CARE_PROVIDER_SITE_OTHER): Payer: BLUE CROSS/BLUE SHIELD | Admitting: Cardiovascular Disease

## 2014-11-20 ENCOUNTER — Encounter: Payer: Self-pay | Admitting: Cardiovascular Disease

## 2014-11-20 VITALS — BP 150/86 | HR 68 | Ht 66.0 in | Wt 178.5 lb

## 2014-11-20 DIAGNOSIS — I10 Essential (primary) hypertension: Secondary | ICD-10-CM | POA: Diagnosis not present

## 2014-11-20 DIAGNOSIS — I5022 Chronic systolic (congestive) heart failure: Secondary | ICD-10-CM

## 2014-11-20 DIAGNOSIS — E785 Hyperlipidemia, unspecified: Secondary | ICD-10-CM

## 2014-11-20 DIAGNOSIS — I2510 Atherosclerotic heart disease of native coronary artery without angina pectoris: Secondary | ICD-10-CM

## 2014-11-20 MED ORDER — LISINOPRIL 20 MG PO TABS: 20.0000 mg | ORAL_TABLET | Freq: Every day | ORAL | Status: DC

## 2014-11-20 NOTE — Assessment & Plan Note (Signed)
He continues to be in [place] class II.  He appears to be euvolemic on small dose Lasix.  I increased the dose of lisinopril back to 20 mg daily given high blood pressure. Check basic metabolic profile today.

## 2014-11-20 NOTE — Progress Notes (Signed)
HPI  This is a pleasant 74 year old man who is here today for a  followup visit regarding chronic systolic heart failure and mitral regurgitation.  He has known history of hypertension, tobacco use and COPD.   He was hospitalized in January of 2015 for congestive heart failure. Echocardiogram in 03/2013 showed an ejection fraction of 25% with inferoposterior akinesis and severe mitral regurgitation with posterior jet.  A right and left cardiac catheterization showed severe one-vessel coronary artery disease with a subtotal occlusion of the mid left circumflex which appeared to be chronic. TEE was performed which showed that the mitral valve was normal in structure and regurgitation was moderate after optimizing his volume status.  He has been doing well and reports significant improvement in dyspnea. He denies any further orthopnea or PND. Repeat echocardiogram in April, 2015 showed an ejection fraction of 30-35%, mild aortic stenosis and moderate mitral regurgitation. He was not interested ICD placement. He has been doing well and denies any chest pain or significant dyspnea. No orthopnea or PND. He fell in July and lacerated his head. He had staples. There was no syncope. Around that time, his blood pressure was running low and the dose of lisinopril was decreased to 10 mg daily.  Allergies  Allergen Reactions  . Codeine Nausea Only  . Statins Other (See Comments)    REACTION: maylgias     Current Outpatient Prescriptions on File Prior to Visit  Medication Sig Dispense Refill  . albuterol (PROVENTIL HFA;VENTOLIN HFA) 108 (90 BASE) MCG/ACT inhaler Inhale 2 puffs into the lungs every 6 (six) hours as needed for wheezing or shortness of breath. 1 Inhaler 2  . aspirin 81 MG tablet Take 81 mg by mouth daily. Takes at night     . clonazePAM (KLONOPIN) 0.5 MG tablet Take 1-3 tablets (0.5-1.5 mg total) by mouth at bedtime as needed for anxiety (or sleep). 45 tablet 0  . furosemide (LASIX) 20  MG tablet Take 1 tablet (20 mg total) by mouth daily. 90 tablet 3  . lisinopril (PRINIVIL,ZESTRIL) 10 MG tablet Take 1 tablet (10 mg total) by mouth daily. 90 tablet 3  . magnesium oxide (MAG-OX) 400 MG tablet Take 1 tablet (400 mg total) by mouth daily. 90 tablet 3  . metoprolol succinate (TOPROL-XL) 100 MG 24 hr tablet Take 1 tablet (100 mg total) by mouth daily. Take with or immediately following a meal. 90 tablet 3  . niacin (NIASPAN) 1000 MG CR tablet TAKE 1 TABLET AT BEDTIME. 30 tablet 5  . spironolactone (ALDACTONE) 25 MG tablet Take 1 tablet (25 mg total) by mouth daily. 90 tablet 3  . ZETIA 10 MG tablet TAKE 1 TABLET ONCE DAILY 30 tablet 11  . traMADol (ULTRAM) 50 MG tablet Take 1 tablet (50 mg total) by mouth every 8 (eight) hours as needed. (Patient not taking: Reported on 11/20/2014) 30 tablet 0   No current facility-administered medications on file prior to visit.     Past Medical History  Diagnosis Date  . Hyperlipidemia   . Arthritis 03/12/2011  . Renal cancer   . Rectal cancer   . Hypertension   . CHF (congestive heart failure)   . Chronic obstructive lung disease   . Mitral regurgitation   . Coronary artery disease 03/2013    EF of 25%, moderate to severe mitral regurgitation. Catheterization showed normal cardiac output with mild pulmonary hypertension. There was 99% stenosis in the mid left circumflex which appeared to be chronic with akinetic  basal inferior/posterior wall. Mild LAD and RCA disease.  . Chronic systolic heart failure 92/3300    EF of 25% with moderate to severe mitral regurgitation     Past Surgical History  Procedure Laterality Date  . Appendectomy  06/2003  . Resection of rectum    . Hernia repair    . Colostomy    . Cataract surgery    . Cardiac catheterization  03/2013    [facility]     Family History  Problem Relation Age of Onset  . Heart disease Mother   . Heart disease Father   . Hyperlipidemia Father   . Hypertension Father   . Heart  attack Brother 16    MI  . Heart disease Sister     stents placed   . Heart disease Sister     CABG     Social History   Social History  . Marital Status: Married    Spouse Name: N/A  . Number of Children: N/A  . Years of Education: N/A   Occupational History  . Not on file.   Social History Main Topics  . Smoking status: Former Smoker -- 1.00 packs/day for 50 years    Types: Cigarettes    Quit date: 03/25/2013  . Smokeless tobacco: Never Used  . Alcohol Use: No  . Drug Use: No  . Sexual Activity: Yes   Other Topics Concern  . Not on file   Social History Narrative     PHYSICAL EXAM   BP 150/86 mmHg  Pulse 68  Ht '5\' 6"'$  (1.676 m)  Wt 178 lb 8 oz (80.967 kg)  BMI 28.82 kg/m2 Constitutional: He is oriented to person, place, and time. He appears well-developed and well-nourished. No distress.  HENT: No nasal discharge.  Head: Normocephalic and atraumatic.  Eyes: Pupils are equal and round.  No discharge. Neck: Normal range of motion. Neck supple. No JVD present. No thyromegaly present.  Cardiovascular: Normal rate, regular rhythm with premature beats, normal heart sounds. Exam reveals no gallop and no friction rub. There is a 1/6 holosystolic murmur at the apex and the left sternal border Pulmonary/Chest: Effort normal and breath sounds normal. No stridor. No respiratory distress. He has no wheezes. He has no rales. He exhibits no tenderness.  Abdominal: Soft. Bowel sounds are normal. He exhibits no distension. There is no tenderness. There is no rebound and no guarding.  Musculoskeletal: Normal range of motion. He exhibits no edema and no tenderness.  Neurological: He is alert and oriented to person, place, and time. Coordination normal.  Skin: Skin is warm and dry. No rash noted. He is not diaphoretic. No erythema. No pallor.  Psychiatric: He has a normal mood and affect. His behavior is normal. Judgment and thought content normal.     EKG: Sinus  Rhythm  -  occasional PAC    # PACs = 1. WITHIN NORMAL LIMITS    ASSESSMENT AND PLAN

## 2014-11-20 NOTE — Assessment & Plan Note (Signed)
He has no symptoms of angina. Continue medical therapy. 

## 2014-11-20 NOTE — Patient Instructions (Signed)
Medication Instructions:  Your physician has recommended you make the following change in your medication:  INCREASE lisinopril to '20mg'$  once per day   Labwork: Your physician recommends that you have labs today: BMEt   Testing/Procedures: none  Follow-Up: Your physician wants you to follow-up in: six months with Dr. Fletcher Anon.  You will receive a reminder letter in the mail two months in advance. If you don't receive a letter, please call our office to schedule the follow-up appointment.   Any Other Special Instructions Will Be Listed Below (If Applicable).

## 2014-11-20 NOTE — Assessment & Plan Note (Signed)
Lab Results  Component Value Date   CHOL 222* 01/29/2014   HDL 63.50 01/29/2014   LDLCALC 138* 01/29/2014   LDLDIRECT 129.7 04/14/2012   TRIG 102.0 01/29/2014   CHOLHDL 3 01/29/2014   He is intolerant to statins and currently on Zetia.

## 2014-11-20 NOTE — Assessment & Plan Note (Signed)
Blood pressure is elevated. Lisinopril was increased as outlined above.

## 2014-11-21 LAB — BASIC METABOLIC PANEL
BUN/Creatinine Ratio: 12 (ref 10–22)
BUN: 9 mg/dL (ref 8–27)
CALCIUM: 9.3 mg/dL (ref 8.6–10.2)
CO2: 22 mmol/L (ref 18–29)
CREATININE: 0.76 mg/dL (ref 0.76–1.27)
Chloride: 94 mmol/L — ABNORMAL LOW (ref 97–108)
GFR, EST AFRICAN AMERICAN: 104 mL/min/{1.73_m2} (ref >59)
GFR, EST NON AFRICAN AMERICAN: 90 mL/min/{1.73_m2} (ref >59)
Glucose: 91 mg/dL (ref 65–99)
Potassium: 5.1 mmol/L (ref 3.5–5.2)
Sodium: 133 mmol/L — ABNORMAL LOW (ref 134–144)

## 2014-12-14 ENCOUNTER — Other Ambulatory Visit: Payer: Self-pay

## 2014-12-14 MED ORDER — CLONAZEPAM 0.5 MG PO TABS: 0.5000 mg | ORAL_TABLET | Freq: Every evening | ORAL | Status: DC | PRN

## 2014-12-14 NOTE — Telephone Encounter (Signed)
Curtis Campos request refill on clonzazepam to [facility]; pt requested refill at pharmacy on 12/13/14; advised do not see request in pts chart and will send refill request now; pt has been out of med for 2 nights. Last rx refilled # 45 on 10/23/14 and pt last seen 10/09/14.Please advise.

## 2014-12-14 NOTE — Telephone Encounter (Signed)
Rx called in to requested pharmacy 

## 2014-12-14 NOTE — Telephone Encounter (Signed)
Pt's wife request status of refill;advised done 5 mins ago. Pt's  wife will ck with pharmacy.

## 2015-01-28 ENCOUNTER — Other Ambulatory Visit: Payer: Self-pay | Admitting: *Deleted

## 2015-01-28 ENCOUNTER — Telehealth: Payer: Self-pay

## 2015-01-28 DIAGNOSIS — M545 Low back pain: Secondary | ICD-10-CM | POA: Diagnosis not present

## 2015-01-28 DIAGNOSIS — M25552 Pain in left hip: Secondary | ICD-10-CM | POA: Diagnosis not present

## 2015-01-28 MED ORDER — CLONAZEPAM 0.5 MG PO TABS: 0.5000 mg | ORAL_TABLET | Freq: Every evening | ORAL | Status: DC | PRN

## 2015-01-28 NOTE — Telephone Encounter (Signed)
PLEASE NOTE: All timestamps contained within this report are represented as [place] Standard Time. CONFIDENTIALTY NOTICE: This fax transmission is intended only for the addressee. It contains information that is legally privileged, confidential or otherwise protected from use or disclosure. If you are not the intended recipient, you are strictly prohibited from reviewing, disclosing, copying using or disseminating any of this information or taking any action in reliance on or regarding this information. If you have received this fax in error, please notify us immediately by telephone so that we can arrange for its return to [place]. Phone: [phone], Toll-Free: [phone], Fax: [phone] Page: 1 of 2 Call Id: [id number] Ault Patient Name: Curtis Campos Gender: Male DOB: 1941/01/16 Age: 74 Y 3 M 11 D Return Phone Number: [phone] (Primary), [phone] (Secondary) Address: [place] [address] Client [facility] Night - Client Client Site [place] [facility] [place], [place] Type Call Call Type Triage / Clinical Caller Name Antwaine Boomhower Relationship To Patient Spouse Return Phone Number [phone] (Primary) Chief Complaint Leg Pain Initial Comment Caller states, husband has sciatic nerve pain , Lt side hip to leg -- wants the Dr on call. and wants him to be seen today. PreDisposition Home Care Nurse Assessment Nurse: Malva Cogan, RN, Juliann Pulse Date/Time Eilene Ghazi Time): 01/27/2015 10:01:07 AM Confirm and document reason for call. If symptomatic, describe symptoms. ---Caller states that her sp has sciatica & had onset of (L) hip pain that radiates down his (L) leg on Wednesday or Thursday. Caller is wanting her sp to be seen today, advised that office is closed today. Has the patient traveled out of the  country within the last 30 days? ---No Does the patient have any new or worsening symptoms? ---Yes Will a triage be completed? ---Yes Related visit to physician within the last 2 weeks? ---No Does the PT have any chronic conditions? (i.e. diabetes, asthma, etc.) ---Yes List chronic conditions. ---high cholesterol, blockages in heart, HTN Guidelines Guideline Title Affirmed Question Affirmed Notes Nurse Date/Time Eilene Ghazi Time) Hip Pain [1] MODERATE pain (e.g., interferes with normal activities, limping) AND [2] present > 3 days Carola Rhine 01/27/2015 10:03:08 AM Disp. Time Eilene Ghazi Time) Disposition Final User 01/27/2015 10:11:40 AM Paged On Call back to [facility], RN, Juliann Pulse 01/27/2015 10:19:14 AM Call Completed Malva Cogan, RN, Juliann Pulse PLEASE NOTE: All timestamps contained within this report are represented as [place] Standard Time. CONFIDENTIALTY NOTICE: This fax transmission is intended only for the addressee. It contains information that is legally privileged, confidential or otherwise protected from use or disclosure. If you are not the intended recipient, you are strictly prohibited from reviewing, disclosing, copying using or disseminating any of this information or taking any action in reliance on or regarding this information. If you have received this fax in error, please notify us immediately by telephone so that we can arrange for its return to [place]. Phone: [phone], Toll-Free: [phone], Fax: [phone] Page: 2 of 2 Call Id: [id number] 01/27/2015 10:08:50 AM See PCP When Office is Open (within 3 days) Yes Malva Cogan, RN, Gara Kroner Understands: Yes Disagree/Comply: Comply Care Advice Given Per Guideline SEE PCP WITHIN 3 DAYS: * You need to be seen within 2 or 3 days. Call your doctor during regular office hours and make an appointment. An urgent care center is often the best source of care if your doctor's office is closed or you can't get an  appointment. NOTE: If office will be open tomorrow, tell caller to call then, not in 3 days. REST YOUR HIP for the next couple days: * Avoid activities that worsen your pain. * Reduce activities that put a lot of strain on the hip joint (e.g., stair climbing, running). LOCAL HEAT: Apply a warm wet washcloth or heating pad for 10 minutes three times daily to help increase circulation and improve healing. PAIN MEDICINES: * For pain relief, take acetaminophen, ibuprofen, or naproxen. * Use the lowest amount that makes your pain feel better. ACETAMINOPHEN (E.G., TYLENOL): * Take 650 mg (two 325 mg pills) by mouth every 4-6 hours as needed. Each Regular Strength Tylenol pill has 325 mg of acetaminophen. The most you should take each day is 3,250 mg (10 Regular Strength pills a day). * Another choice is to take 1,000 mg (two 500 mg pills) every 8 hours as needed. Each Extra Strength Tylenol pill has 500 mg of acetaminophen. The most you should take each day is 3,000 mg (6 Extra Strength pills a day). NAPROXEN (E.G., ALEVE): * Take 220 mg (one 220 mg pill) by mouth every 8 hours as needed. You may take 440 mg (two 220 mg pills) for your first dose. * The most you should take each day is 660 mg (three 220 mg pills a day), unless your doctor has told you to take more. EXTRA NOTES: * Acetaminophen is thought to be safer than ibuprofen or naproxen for people over 65 years old. Acetaminophen is in many OTC and prescription medicines. It might be in more than one medicine that you are taking. You need to be careful and not take an overdose. An acetaminophen overdose can hurt the liver. * Before taking any medicine, read all the instructions on the package. CAUTION - NSAIDS (E.G., IBUPROFEN, NAPROXEN): * Do not take nonsteroidal anti-inflammatory drugs (NSAIDs) if you have stomach problems, kidney disease, heart failure, or other contraindications to using this type of medication. * Do not take NSAID medications  for over 7 days without consulting your PCP. * You may take this medicine with or without food. Taking it with food or milk may lessen the chance the drug will upset your stomach. CALL BACK IF: * Fever or severe pain occurs * Skin redness or a rash appears * You become worse. CARE ADVICE given per Hip Pain (Adult) guideline. After Care Instructions Given Call Event Type User Date / Time Description Comments User: Olena Mater, RN Date/Time Eilene Ghazi Time): 01/27/2015 10:11:03 AM Caller advised that offices are closed today but caller asked if on call provider would be able to give her sp a cortisone injection, advised that not if the offices are closed. Caller asked to speak with on call provider. Paging DoctorName Phone DateTime Result/Outcome Message Type Notes Carolann Littler [phone] 01/27/2015 10:11:40 AM Paged On Call Back to Call Center Doctor Paged Plz call Janann Colonel at [facility] [phone]. Carolann Littler 01/27/2015 10:18:57 AM Spoke with On Call - General Message Result Dr. Elease Hashimoto returned page at 1015, advised of situation & that caller is insistent on speaking iwth on call provider even though symptoms have been addressed & pt has an appt scheduled for 01/30/15 at 1530, transferred Dr. Elease Hashimoto to caller.

## 2015-01-28 NOTE — Telephone Encounter (Signed)
Last f/u 01/2014

## 2015-01-28 NOTE — Telephone Encounter (Signed)
Pt has appt scheduled 01/30/15 at 3:30 pm with Dr Lorelei Pont.

## 2015-01-28 NOTE — Telephone Encounter (Signed)
Rx called in to requested pharmacy 

## 2015-01-30 ENCOUNTER — Ambulatory Visit: Payer: BLUE CROSS/BLUE SHIELD | Admitting: Family Medicine

## 2015-02-25 ENCOUNTER — Other Ambulatory Visit: Payer: Self-pay | Admitting: Family Medicine

## 2015-03-12 ENCOUNTER — Other Ambulatory Visit: Payer: Self-pay | Admitting: *Deleted

## 2015-03-12 NOTE — Telephone Encounter (Signed)
Last f/u 01/2014

## 2015-03-12 NOTE — Telephone Encounter (Signed)
Ok to refill one time only, needs OV for further refills.

## 2015-03-13 ENCOUNTER — Other Ambulatory Visit: Payer: Self-pay

## 2015-03-13 DIAGNOSIS — C20 Malignant neoplasm of rectum: Secondary | ICD-10-CM

## 2015-03-13 MED ORDER — CLONAZEPAM 0.5 MG PO TABS: 0.5000 mg | ORAL_TABLET | Freq: Every evening | ORAL | Status: DC | PRN

## 2015-03-13 NOTE — Telephone Encounter (Signed)
Rx called in to requested pharmacy 

## 2015-03-14 ENCOUNTER — Telehealth: Payer: Self-pay | Admitting: Hematology and Oncology

## 2015-03-14 ENCOUNTER — Other Ambulatory Visit (HOSPITAL_BASED_OUTPATIENT_CLINIC_OR_DEPARTMENT_OTHER): Payer: BLUE CROSS/BLUE SHIELD

## 2015-03-14 ENCOUNTER — Encounter: Payer: Self-pay | Admitting: Hematology and Oncology

## 2015-03-14 ENCOUNTER — Ambulatory Visit (HOSPITAL_BASED_OUTPATIENT_CLINIC_OR_DEPARTMENT_OTHER): Payer: BLUE CROSS/BLUE SHIELD | Admitting: Hematology and Oncology

## 2015-03-14 VITALS — BP 154/77 | HR 66 | Temp 97.8°F | Resp 18 | Ht 66.0 in | Wt 180.7 lb

## 2015-03-14 DIAGNOSIS — Z85048 Personal history of other malignant neoplasm of rectum, rectosigmoid junction, and anus: Secondary | ICD-10-CM

## 2015-03-14 DIAGNOSIS — C20 Malignant neoplasm of rectum: Secondary | ICD-10-CM

## 2015-03-14 LAB — CBC WITH DIFFERENTIAL/PLATELET
BASO%: 0.7 % (ref 0.0–2.0)
BASOS ABS: 0.1 10*3/uL (ref 0.0–0.1)
EOS ABS: 0.5 10*3/uL (ref 0.0–0.5)
EOS%: 5.8 % (ref 0.0–7.0)
HCT: 36.7 % — ABNORMAL LOW (ref 38.4–49.9)
HGB: 12.3 g/dL — ABNORMAL LOW (ref 13.0–17.1)
LYMPH%: 23.3 % (ref 14.0–49.0)
MCH: 30.5 pg (ref 27.2–33.4)
MCHC: 33.5 g/dL (ref 32.0–36.0)
MCV: 91.1 fL (ref 79.3–98.0)
MONO#: 0.8 10*3/uL (ref 0.1–0.9)
MONO%: 8.9 % (ref 0.0–14.0)
NEUT#: 5.3 10*3/uL (ref 1.5–6.5)
NEUT%: 61.3 % (ref 39.0–75.0)
Platelets: 279 10*3/uL (ref 140–400)
RBC: 4.03 10*6/uL — AB (ref 4.20–5.82)
RDW: 13.5 % (ref 11.0–14.6)
WBC: 8.6 10*3/uL (ref 4.0–10.3)
lymph#: 2 10*3/uL (ref 0.9–3.3)

## 2015-03-14 LAB — COMPREHENSIVE METABOLIC PANEL
ALK PHOS: 61 U/L (ref 40–150)
ALT: 14 U/L (ref 0–55)
ANION GAP: 9 meq/L (ref 3–11)
AST: 19 U/L (ref 5–34)
Albumin: 4 g/dL (ref 3.5–5.0)
BILIRUBIN TOTAL: 0.86 mg/dL (ref 0.20–1.20)
BUN: 17.6 mg/dL (ref 7.0–26.0)
CO2: 24 meq/L (ref 22–29)
Calcium: 9.6 mg/dL (ref 8.4–10.4)
Chloride: 98 mEq/L (ref 98–109)
Creatinine: 0.9 mg/dL (ref 0.7–1.3)
EGFR: 79 mL/min/{1.73_m2} — AB (ref >90)
GLUCOSE: 115 mg/dL (ref 70–140)
POTASSIUM: 4.9 meq/L (ref 3.5–5.1)
SODIUM: 131 meq/L — AB (ref 136–145)
TOTAL PROTEIN: 7.4 g/dL (ref 6.4–8.3)

## 2015-03-14 NOTE — Addendum Note (Signed)
Addended by: Prentiss Bells on: 03/14/2015 05:36 PM   Modules accepted: Medications

## 2015-03-14 NOTE — Progress Notes (Signed)
Patient Care Team: Lucille Passy, MD as PCP - General  DIAGNOSIS: 74 year old gentleman with adenocarcinoma of the rectum diagnosed December 2004  PRIOR THERAPY:  Patient initially presented with a large rectal mass measuring 3.5 x 3.0 cm about 5.0 cm from the anal verge. He received preop chemoradiation with chemotherapy consisting of infusional 5-FU. He completed all of his therapy in February 2005 and then underwent an APR with colostomy.  CURRENT THERAPY: Observation  CHIEF COMPLIANT: Follow-up of rectal cancer  INTERVAL HISTORY: Curtis Campos is a 74 year old with above-mentioned history of rectal cancer currently on surveillance. He is here for annual follow-up. He reports no new problems or concerns. He is still working full-time. He does feel moderately tired at the end of the day. He is looking forward to his 2 weeks of vacation coming up.  REVIEW OF SYSTEMS:   Constitutional: Denies fevers, chills or abnormal weight loss Eyes: Denies blurriness of vision Ears, nose, mouth, throat, and face: Denies mucositis or sore throat Respiratory: Denies cough, dyspnea or wheezes Cardiovascular: Denies palpitation, chest discomfort or lower extremity swelling Gastrointestinal:  Colostomy is working well. Skin: Denies abnormal skin rashes Lymphatics: Denies new lymphadenopathy or easy bruising Neurological:Denies numbness, tingling or new weaknesses Behavioral/Psych: Mood is stable, no new changes   All other systems were reviewed with the patient and are negative.  I have reviewed the past medical history, past surgical history, social history and family history with the patient and they are unchanged from previous note.  ALLERGIES:  is allergic to codeine and statins.  MEDICATIONS:  Current Outpatient Prescriptions  Medication Sig Dispense Refill  . albuterol (PROVENTIL HFA;VENTOLIN HFA) 108 (90 BASE) MCG/ACT inhaler Inhale 2 puffs into the lungs every 6 (six) hours as needed  for wheezing or shortness of breath. 1 Inhaler 2  . aspirin 81 MG tablet Take 81 mg by mouth daily. Takes at night     . clonazePAM (KLONOPIN) 0.5 MG tablet Take 1-3 tablets (0.5-1.5 mg total) by mouth at bedtime as needed for anxiety (or sleep). 45 tablet 0  . furosemide (LASIX) 20 MG tablet Take 1 tablet (20 mg total) by mouth daily. 90 tablet 3  . lisinopril (PRINIVIL,ZESTRIL) 20 MG tablet Take 1 tablet (20 mg total) by mouth daily. 30 tablet 5  . magnesium oxide (MAG-OX) 400 MG tablet Take 1 tablet (400 mg total) by mouth daily. 90 tablet 3  . metoprolol succinate (TOPROL-XL) 100 MG 24 hr tablet Take 1 tablet (100 mg total) by mouth daily. Take with or immediately following a meal. 90 tablet 3  . niacin (NIASPAN) 1000 MG CR tablet TAKE 1 TABLET AT BEDTIME. 90 tablet 1  . spironolactone (ALDACTONE) 25 MG tablet Take 1 tablet (25 mg total) by mouth daily. 90 tablet 3  . traMADol (ULTRAM) 50 MG tablet Take 1 tablet (50 mg total) by mouth every 8 (eight) hours as needed. (Patient not taking: Reported on 11/20/2014) 30 tablet 0  . ZETIA 10 MG tablet TAKE 1 TABLET ONCE DAILY 30 tablet 11   No current facility-administered medications for this visit.    PHYSICAL EXAMINATION: ECOG PERFORMANCE STATUS: 1 - Symptomatic but completely ambulatory  Filed Vitals:   03/14/15 1126  BP: 154/77  Pulse: 66  Temp: 97.8 F (36.6 C)  Resp: 18   Filed Weights   03/14/15 1126  Weight: 180 lb 11.2 oz (81.965 kg)    GENERAL:alert, no distress and comfortable SKIN: skin color, texture, turgor are normal, no rashes  or significant lesions EYES: normal, Conjunctiva are pink and non-injected, sclera clear OROPHARYNX:no exudate, no erythema and lips, buccal mucosa, and tongue normal  NECK: supple, thyroid normal size, non-tender, without nodularity LYMPH:  no palpable lymphadenopathy in the cervical, axillary or inguinal LUNGS: clear to auscultation and percussion with normal breathing effort HEART: regular  rate & rhythm and no murmurs and no lower extremity edema ABDOMEN: Colostomy in left side. No hepatosplenomegaly Musculoskeletal:no cyanosis of digits and no clubbing  NEURO: alert & oriented x 3 with fluent speech, no focal motor/sensory deficits  LABORATORY DATA:  I have reviewed the data as listed   Chemistry      Component Value Date/Time   NA 133* 11/20/2014 1430   NA 134* 03/12/2014 1148   NA 133* 01/29/2014 1647   NA 132* 04/13/2013 0421   NA 142 03/01/2009 1331   K 5.1 11/20/2014 1430   K 4.5 03/12/2014 1148   K 4.6 04/13/2013 0421   K 4.5 03/01/2009 1331   CL 94* 11/20/2014 1430   CL 94* 04/13/2013 0421   CL 101 03/11/2012 1404   CL 99 03/01/2009 1331   CO2 22 11/20/2014 1430   CO2 23 03/12/2014 1148   CO2 38* 04/13/2013 0421   CO2 30 03/01/2009 1331   BUN 9 11/20/2014 1430   BUN 13.6 03/12/2014 1148   BUN 17 01/29/2014 1647   BUN 27* 04/13/2013 0421   BUN 15 03/01/2009 1331   CREATININE 0.76 11/20/2014 1430   CREATININE 0.8 03/12/2014 1148   CREATININE 0.86 04/13/2013 0421   CREATININE 0.7 03/01/2009 1331      Component Value Date/Time   CALCIUM 9.3 11/20/2014 1430   CALCIUM 9.3 03/12/2014 1148   CALCIUM 8.6 04/13/2013 0421   CALCIUM 9.2 03/01/2009 1331   ALKPHOS 67 03/12/2014 1148   ALKPHOS 59 01/29/2014 1647   ALKPHOS 68 03/01/2009 1331   AST 17 03/12/2014 1148   AST 28 01/29/2014 1647   AST 26 03/01/2009 1331   ALT 15 03/12/2014 1148   ALT 18 01/29/2014 1647   ALT 28 03/01/2009 1331   BILITOT 0.55 03/12/2014 1148   BILITOT 0.7 01/29/2014 1647   BILITOT 0.60 03/01/2009 1331       Lab Results  Component Value Date   WBC 8.6 03/14/2015   HGB 12.3* 03/14/2015   HCT 36.7* 03/14/2015   MCV 91.1 03/14/2015   PLT 279 03/14/2015   NEUTROABS 5.3 03/14/2015   ASSESSMENT & PLAN:  Malignant neoplasm of rectum Adenocarcinoma the rectum diagnosed December 2004 presented as 3.5 cm mass 5 cm from the anal verge status post neoadjuvant chemoradiation  with IV 5-FU followed by APR with colostomy.  Surveillance: Annual CEAs had been normal. CEA done today is pending. CBC CMP without any major abnormalities. patient refuses colonoscopy screening because he does not want anyone to go through the ostomy since it has been working so well and he does not want anyone to mess it up.  Survivorship: I discussed with him the importance of exercise and eating more fruits and vegetables and decreasing the risk of recurrence of colon cancer.  Return to clinic once a year for follow-up and lab work.   No orders of the defined types were placed in this encounter.   The patient has a good understanding of the overall plan. he agrees with it. he will call with any problems that may develop before the next visit here.   Rulon Eisenmenger, MD 03/14/2015

## 2015-03-14 NOTE — Telephone Encounter (Signed)
Appointments made and avs printed for patient °

## 2015-03-14 NOTE — Assessment & Plan Note (Signed)
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  Surveillance: Annual CEAs had been normal. CEA done today is pending. CBC CMP without any major abnormalities. patient refuses colonoscopy screening because he does not want anyone to go through the ostomy since it has been working so well and he does not want anyone to mess it up.  Survivorship: I discussed with him the importance of exercise and eating more fruits and vegetables and decreasing the risk of recurrence of colon cancer.  Return to clinic once a year for follow-up and lab work.

## 2015-03-15 ENCOUNTER — Telehealth: Payer: Self-pay

## 2015-03-15 LAB — CEA: CEA: 1.3 ng/mL (ref 0.0–5.0)

## 2015-03-15 NOTE — Telephone Encounter (Signed)
Provided wife Barnetta Chapel with CEA results.  She voiced understanding.

## 2015-03-20 ENCOUNTER — Ambulatory Visit (INDEPENDENT_AMBULATORY_CARE_PROVIDER_SITE_OTHER): Payer: BLUE CROSS/BLUE SHIELD | Admitting: Family Medicine

## 2015-03-20 ENCOUNTER — Encounter: Payer: Self-pay | Admitting: Family Medicine

## 2015-03-20 VITALS — BP 120/84 | HR 72 | Temp 98.5°F | Ht 66.0 in | Wt 181.5 lb

## 2015-03-20 DIAGNOSIS — M1711 Unilateral primary osteoarthritis, right knee: Secondary | ICD-10-CM | POA: Diagnosis not present

## 2015-03-20 DIAGNOSIS — I2510 Atherosclerotic heart disease of native coronary artery without angina pectoris: Secondary | ICD-10-CM

## 2015-03-20 MED ORDER — METHYLPREDNISOLONE ACETATE 40 MG/ML IJ SUSP
80.0000 mg | Freq: Once | INTRAMUSCULAR | Status: AC
  Administered 2015-03-20: 80 mg via INTRA_ARTICULAR

## 2015-03-20 NOTE — Progress Notes (Signed)
Dr. Frederico Hamman T. Ansel Ferrall, MD, [facility] [place], [address] Phone: [phone] Fax: [phone]  03/20/2015  Patient: Curtis Campos, MRN: [id number], DOB: Nov 10, 1940, 74 y.o.  Primary Physician:  Arnette Norris, MD   Chief Complaint  Patient presents with  . Knee Pain    Bilateral-Right is worse   Subjective:   Curtis Campos is a 74 y.o. very pleasant male patient who presents with the following:   patient seen previously more than a year ago, and at that point I injected his right knee and he had a good relief of symptoms for at least 6 months. He has known right-sided knee arthritis is quite severe, and bone-on-bone arthritis. He has seen Dr. Noemi Chapel for a number of years. He is here today with an acute flareup in his knee is bothering him quite a bit.  R knee is hurting, has been bothering for 4-5 years.  End stage r knee oa, sees wainer occ  R knee inj  Past Medical History, Surgical History, Social History, Family History, Problem List, Medications, and Allergies have been reviewed and updated if relevant.  Patient Active Problem List   Diagnosis Date Noted  . Hypotension 10/09/2014  . Head injury 10/09/2014  . On home oxygen therapy 06/27/2014  . Cough 03/22/2014  . Rash and nonspecific skin eruption 03/22/2014  . History of bacterial pneumonia 01/29/2014  . Mitral regurgitation   . COPD exacerbation ([place]) 04/21/2013  . Acute-on-chronic respiratory failure ([place]) 04/21/2013  . CHF with cardiomyopathy ([place]) 04/21/2013  . Coronary artery disease 03/30/2013  . Chronic systolic heart failure ([place]) 03/30/2013  . Insomnia 03/29/2013  . SOB (shortness of breath) 01/17/2013  . Multifocal atrial tachycardia ([place]) 01/17/2013  . CAP (community acquired pneumonia) 04/07/2012  . Arthritis 03/12/2011  . Episodic mood disorder ([place]) 04/06/2008  . Essential hypertension 03/06/2008  . Nicotine dependence 03/10/2007   . HLD (hyperlipidemia) 03/18/2006  . Malignant neoplasm of rectum ([place]) 03/31/2003    Past Medical History  Diagnosis Date  . Hyperlipidemia   . Arthritis 03/12/2011  . Renal cancer ([place])   . Rectal cancer ([place])   . Hypertension   . CHF (congestive heart failure) ([place])   . Chronic obstructive lung disease ([place])   . Mitral regurgitation   . Coronary artery disease 03/2013    EF of 25%, moderate to severe mitral regurgitation. Catheterization showed normal cardiac output with mild pulmonary hypertension. There was 99% stenosis in the mid left circumflex which appeared to be chronic with akinetic basal inferior/posterior wall. Mild LAD and RCA disease.  . Chronic systolic heart failure ([place]) 03/2013    EF of 25% with moderate to severe mitral regurgitation    Past Surgical History  Procedure Laterality Date  . Appendectomy  06/2003  . Resection of rectum    . Hernia repair    . Colostomy    . Cataract surgery    . Cardiac catheterization  03/2013    [facility]    Social History   Social History  . Marital Status: Married    Spouse Name: N/A  . Number of Children: N/A  . Years of Education: N/A   Occupational History  . Not on file.   Social History Main Topics  . Smoking status: Former Smoker -- 1.00 packs/day for 50 years    Types: Cigarettes    Quit date: 03/25/2013  . Smokeless tobacco: Never Used  . Alcohol Use: No  .  Drug Use: No  . Sexual Activity: Yes   Other Topics Concern  . Not on file   Social History Narrative    Family History  Problem Relation Age of Onset  . Heart disease Mother   . Heart disease Father   . Hyperlipidemia Father   . Hypertension Father   . Heart attack Brother 29    MI  . Heart disease Sister     stents placed   . Heart disease Sister     CABG    Allergies  Allergen Reactions  . Codeine Nausea Only  . Statins Other (See Comments)    REACTION: maylgias    Medication list reviewed and updated in full in [place].  GEN: No fevers, chills. Nontoxic. Primarily MSK c/o today. MSK: Detailed in the HPI GI: tolerating PO intake without difficulty Neuro: No numbness, parasthesias, or tingling associated. Otherwise the pertinent positives of the ROS are noted above.   Objective:   BP 120/84 mmHg  Pulse 72  Temp(Src) 98.5 F (36.9 C) (Oral)  Ht '5\' 6"'$  (1.676 m)  Wt 181 lb 8 oz (82.328 kg)  BMI 29.31 kg/m2   GEN: WDWN, NAD, Non-toxic, Alert & Oriented x 3 HEENT: Atraumatic, Normocephalic.  Ears and Nose: No external deformity. EXTR: No clubbing/cyanosis/edema NEURO: Normal gait.  PSYCH: Normally interactive. Conversant. Not depressed or anxious appearing.  Calm demeanor.   Knee:  R Gait: Normal heel toe pattern ROM: lacks 5 deg of ext and flexion to 95 Effusion: mild Echymosis or edema: none Patellar tendon NT Painful PLICA: neg Patellar grind: negative Medial and lateral patellar facet loading: negative medial and lateral joint lines: more pain medially Mcmurray's neg Flexion-pinch neg Varus and valgus stress: stable Lachman: neg Ant and Post drawer: neg Hip abduction, IR, ER: WNL Hip flexion str: 5/5 Hip abd: 5/5 Quad: 5/5 VMO atrophy: mild Hamstring concentric and eccentric: 5/5   Radiology: No results found.  Assessment and Plan:   Primary osteoarthritis of right knee - Plan: methylPREDNISolone acetate (DEPO-MEDROL) injection 80 mg  He knows that he has end stage R knee OA. Supportive care for now.  Knee Injection, R Patient verbally consented to procedure. Risks (including potential rare risk of infection), benefits, and alternatives explained. Sterilely prepped with Chloraprep. Ethyl cholride used for anesthesia. 8 cc Lidocaine 1% mixed with 2 mL Depo-Medrol 40 mg injected using the anteromedial approach without difficulty. No complications with procedure and tolerated well. Patient had decreased pain post-injection.   Follow-up: prn  Signed,  Tarrah Furuta T.  Karthik Whittinghill, MD   Patient's Medications  New Prescriptions   No medications on file  Previous Medications   ALBUTEROL (PROVENTIL HFA;VENTOLIN HFA) 108 (90 BASE) MCG/ACT INHALER    Inhale 2 puffs into the lungs every 6 (six) hours as needed for wheezing or shortness of breath.   ASPIRIN 81 MG TABLET    Take 81 mg by mouth daily. Takes at night    CLONAZEPAM (KLONOPIN) 0.5 MG TABLET    Take 1-3 tablets (0.5-1.5 mg total) by mouth at bedtime as needed for anxiety (or sleep).   FUROSEMIDE (LASIX) 20 MG TABLET    Take 1 tablet (20 mg total) by mouth daily.   LISINOPRIL (PRINIVIL,ZESTRIL) 10 MG TABLET    Take 10 mg by mouth daily.    MAGNESIUM OXIDE (MAG-OX) 400 MG TABLET    Take 1 tablet (400 mg total) by mouth daily.   METOPROLOL SUCCINATE (TOPROL-XL) 100 MG 24 HR TABLET  Take 1 tablet (100 mg total) by mouth daily. Take with or immediately following a meal.   NIACIN (NIASPAN) 1000 MG CR TABLET    TAKE 1 TABLET AT BEDTIME.   SPIRONOLACTONE (ALDACTONE) 25 MG TABLET    Take 1 tablet (25 mg total) by mouth daily.   TRAMADOL (ULTRAM) 50 MG TABLET    Take 1 tablet (50 mg total) by mouth every 8 (eight) hours as needed.   ZETIA 10 MG TABLET    TAKE 1 TABLET ONCE DAILY  Modified Medications   No medications on file  Discontinued Medications   LISINOPRIL (PRINIVIL,ZESTRIL) 20 MG TABLET    Take 1 tablet (20 mg total) by mouth daily.

## 2015-03-20 NOTE — Progress Notes (Signed)
Pre visit review using our clinic review tool, if applicable. No additional management support is needed unless otherwise documented below in the visit note. 

## 2015-04-26 ENCOUNTER — Other Ambulatory Visit: Payer: Self-pay | Admitting: Family Medicine

## 2015-04-29 ENCOUNTER — Other Ambulatory Visit: Payer: Self-pay | Admitting: *Deleted

## 2015-04-29 MED ORDER — CLONAZEPAM 0.5 MG PO TABS: 0.5000 mg | ORAL_TABLET | Freq: Every evening | ORAL | Status: DC | PRN

## 2015-04-29 NOTE — Telephone Encounter (Signed)
Your last refill indicates OV required as pt has not had f/u since 01/2014. pts was advised and sched an appt, but it was with Dr Lorelei Pont for osteoarthritis

## 2015-04-29 NOTE — Telephone Encounter (Signed)
Noted.  Thank you.  Please schedule 30 minute follow up with me.

## 2015-04-29 NOTE — Telephone Encounter (Signed)
Patient's wife called requesting a refill on Clonazepam Last refill 03/13/15 #45 Last office visit 03/20/15/acute Is it okay to refill?

## 2015-04-29 NOTE — Telephone Encounter (Signed)
Rx called in to requested pharmacy. Pts wife previously contacted office and scheduled OV

## 2015-05-01 ENCOUNTER — Encounter: Payer: Self-pay | Admitting: Family Medicine

## 2015-05-01 ENCOUNTER — Ambulatory Visit (INDEPENDENT_AMBULATORY_CARE_PROVIDER_SITE_OTHER): Payer: BLUE CROSS/BLUE SHIELD | Admitting: Family Medicine

## 2015-05-01 VITALS — BP 124/78 | HR 80 | Temp 97.9°F | Wt 184.0 lb

## 2015-05-01 DIAGNOSIS — E785 Hyperlipidemia, unspecified: Secondary | ICD-10-CM | POA: Diagnosis not present

## 2015-05-01 DIAGNOSIS — I509 Heart failure, unspecified: Secondary | ICD-10-CM

## 2015-05-01 DIAGNOSIS — G4700 Insomnia, unspecified: Secondary | ICD-10-CM

## 2015-05-01 DIAGNOSIS — I2510 Atherosclerotic heart disease of native coronary artery without angina pectoris: Secondary | ICD-10-CM

## 2015-05-01 DIAGNOSIS — I429 Cardiomyopathy, unspecified: Secondary | ICD-10-CM

## 2015-05-01 DIAGNOSIS — I10 Essential (primary) hypertension: Secondary | ICD-10-CM

## 2015-05-01 LAB — LIPID PANEL
CHOLESTEROL: 192 mg/dL (ref 0–200)
HDL: 72.5 mg/dL (ref >39.00)
LDL CALC: 110 mg/dL — AB (ref 0–99)
NONHDL: 119.8
Total CHOL/HDL Ratio: 3
Triglycerides: 108 mg/dL (ref 0.0–149.0)
VLDL: 21.6 mg/dL (ref 0.0–40.0)

## 2015-05-01 NOTE — Assessment & Plan Note (Signed)
Followed by cardiology. Taking ASA, beta blocker. Intolerant to statins. No changes made to rx today. Check lipid panel. Orders Placed This Encounter  Procedures  . Lipid panel

## 2015-05-01 NOTE — Progress Notes (Signed)
Pre visit review using our clinic review tool, if applicable. No additional management support is needed unless otherwise documented below in the visit note. 

## 2015-05-01 NOTE — Assessment & Plan Note (Signed)
Reasonable control with klonipin prn.  He is aware of increased fall risk with chronic benzo use.

## 2015-05-01 NOTE — Assessment & Plan Note (Signed)
Well controlled. Continue current rx. No changes made today.

## 2015-05-01 NOTE — Assessment & Plan Note (Signed)
Intolerant to statins. Continue current rxs. Check labs today.

## 2015-05-01 NOTE — Progress Notes (Signed)
Subjective:   Patient ID: Curtis Campos, male    DOB: 31-Aug-1940, 75 y.o.   MRN: [id number]  Curtis Campos is a pleasant 75 y.o. year old male who presents to clinic today with Follow-up  on 05/01/2015  HPI:  Anxiety/insomnia- does still take Klonipin 0.5 mg three times daily prn anxiety/insomnia.  Mainly only take at bedtime.  CHF and MR- followed by cardiology, Dr. Fletcher Anon.  Was last seen on 11/20/14- note reviewed. Lisinopril was increased due to elevated blood pressure.   He has since decreased lisinopril 10 mg daily due to dizziness/hypotension.  Toprol, Lasix and Aldactone were continued. Takes ASA 81 mg daily.  HLD- intolerant to statins.  He has been compliant with daily Zetia and Niaspan.  Lab Results  Component Value Date   CHOL 222* 01/29/2014   HDL 63.50 01/29/2014   LDLCALC 138* 01/29/2014   LDLDIRECT 129.7 04/14/2012   TRIG 102.0 01/29/2014   CHOLHDL 3 01/29/2014   Lab Results  Component Value Date   CREATININE 0.9 03/14/2015   Lab Results  Component Value Date   ALT 14 03/14/2015   AST 19 03/14/2015   ALKPHOS 61 03/14/2015   BILITOT 0.86 03/14/2015    Current Outpatient Prescriptions on File Prior to Visit  Medication Sig Dispense Refill  . aspirin 81 MG tablet Take 81 mg by mouth daily. Takes at night     . clonazePAM (KLONOPIN) 0.5 MG tablet Take 1-3 tablets (0.5-1.5 mg total) by mouth at bedtime as needed for anxiety (or sleep). 45 tablet 0  . furosemide (LASIX) 20 MG tablet Take 1 tablet (20 mg total) by mouth daily. 90 tablet 3  . lisinopril (PRINIVIL,ZESTRIL) 10 MG tablet Take 10 mg by mouth daily.     . magnesium oxide (MAG-OX) 400 MG tablet Take 1 tablet (400 mg total) by mouth daily. 90 tablet 3  . metoprolol succinate (TOPROL-XL) 100 MG 24 hr tablet Take 1 tablet (100 mg total) by mouth daily. Take with or immediately following a meal. 90 tablet 3  . niacin (NIASPAN) 1000 MG CR tablet TAKE 1 TABLET AT BEDTIME. 30 tablet 0  . spironolactone  (ALDACTONE) 25 MG tablet Take 1 tablet (25 mg total) by mouth daily. 90 tablet 3  . traMADol (ULTRAM) 50 MG tablet Take 1 tablet (50 mg total) by mouth every 8 (eight) hours as needed. 30 tablet 0  . ZETIA 10 MG tablet TAKE 1 TABLET ONCE DAILY 30 tablet 11   No current facility-administered medications on file prior to visit.    Allergies  Allergen Reactions  . Codeine Nausea Only  . Statins Other (See Comments)    REACTION: maylgias    Past Medical History  Diagnosis Date  . Hyperlipidemia   . Arthritis 03/12/2011  . Renal cancer ([place])   . Rectal cancer ([place])   . Hypertension   . CHF (congestive heart failure) ([place])   . Chronic obstructive lung disease ([place])   . Mitral regurgitation   . Coronary artery disease 03/2013    EF of 25%, moderate to severe mitral regurgitation. Catheterization showed normal cardiac output with mild pulmonary hypertension. There was 99% stenosis in the mid left circumflex which appeared to be chronic with akinetic basal inferior/posterior wall. Mild LAD and RCA disease.  . Chronic systolic heart failure ([place]) 03/2013    EF of 25% with moderate to severe mitral regurgitation    Past Surgical History  Procedure Laterality Date  . Appendectomy  06/2003  .  Resection of rectum    . Hernia repair    . Colostomy    . Cataract surgery    . Cardiac catheterization  03/2013    [facility]    Family History  Problem Relation Age of Onset  . Heart disease Mother   . Heart disease Father   . Hyperlipidemia Father   . Hypertension Father   . Heart attack Brother 72    MI  . Heart disease Sister     stents placed   . Heart disease Sister     CABG    Social History   Social History  . Marital Status: Married    Spouse Name: N/A  . Number of Children: N/A  . Years of Education: N/A   Occupational History  . Not on file.   Social History Main Topics  . Smoking status: Former Smoker -- 1.00 packs/day for 50 years    Types: Cigarettes    Quit date:  03/25/2013  . Smokeless tobacco: Never Used  . Alcohol Use: No  . Drug Use: No  . Sexual Activity: Yes   Other Topics Concern  . Not on file   Social History Narrative   The PMH, PSH, Social History, Family History, Medications, and allergies have been reviewed in [facility], and have been updated if relevant.   Review of Systems  Constitutional: Negative.   HENT: Negative.   Respiratory: Negative.   Cardiovascular: Negative.   Gastrointestinal: Negative.   Endocrine: Negative.   Genitourinary: Negative.   Musculoskeletal: Negative.   Allergic/Immunologic: Negative.   Neurological: Negative.   Psychiatric/Behavioral: Positive for sleep disturbance. Negative for suicidal ideas, hallucinations, behavioral problems, confusion, self-injury, dysphoric mood, decreased concentration and agitation. The patient is not nervous/anxious and is not hyperactive.   All other systems reviewed and are negative.      Objective:    BP 124/78 mmHg  Pulse 80  Temp(Src) 97.9 F (36.6 C) (Oral)  Wt 184 lb (83.462 kg) Wt Readings from Last 3 Encounters:  05/01/15 184 lb (83.462 kg)  03/20/15 181 lb 8 oz (82.328 kg)  03/14/15 180 lb 11.2 oz (81.965 kg)     Physical Exam  Constitutional: He is oriented to person, place, and time. He appears well-developed and well-nourished. No distress.  HENT:  Head: Normocephalic and atraumatic.  Eyes: Conjunctivae are normal.  Cardiovascular: Normal rate and regular rhythm.   Pulmonary/Chest: Effort normal and breath sounds normal. No respiratory distress. He has no wheezes.  Musculoskeletal: Normal range of motion. He exhibits no edema.  Neurological: He is alert and oriented to person, place, and time. No cranial nerve deficit.  Skin: Skin is warm and dry. He is not diaphoretic.  Psychiatric: He has a normal mood and affect. His behavior is normal. Judgment and thought content normal.  Nursing note and vitals reviewed.         Assessment & Plan:    HLD (hyperlipidemia) - Plan: Lipid panel  Essential hypertension  Coronary artery disease involving native coronary artery of native heart without angina pectoris  CHF with cardiomyopathy ([place]) No Follow-up on file.

## 2015-05-02 ENCOUNTER — Telehealth: Payer: Self-pay

## 2015-05-02 NOTE — Telephone Encounter (Signed)
Please call pt to get more information.  Is he requesting tessalon?  He is allergic to Codeine

## 2015-05-02 NOTE — Telephone Encounter (Signed)
Barnetta Chapel pts wife (DPR signed) left v/m; pt was seen 05/01/15; pt mentioned cough and congestion and pt understood Dr Deborra Medina to say he had allergies; Catherine request rx sent to [facility] for cough and congestion; does not want to take OTC meds. Catherine request cb.

## 2015-05-02 NOTE — Telephone Encounter (Signed)
Lm on pts vm requesting a call back 

## 2015-05-03 MED ORDER — CETIRIZINE HCL 10 MG PO TABS
10.0000 mg | ORAL_TABLET | Freq: Every day | ORAL | Status: DC
Start: 2015-05-03 — End: 2015-12-13

## 2015-05-03 MED ORDER — BENZONATATE 100 MG PO CAPS: 100.0000 mg | ORAL_CAPSULE | Freq: Two times a day (BID) | ORAL | Status: DC | PRN

## 2015-05-03 NOTE — Telephone Encounter (Signed)
Spoke to pts wife and advised both meds sent

## 2015-05-03 NOTE — Telephone Encounter (Signed)
Spoke to pts wife and she states that pt was wanting some type of antihistamine, like zyrtec. She states you may also send in tessalon if you think it is needed, based on what s/s he discussed at his OV.

## 2015-05-03 NOTE — Telephone Encounter (Signed)
eRx sent for zyrtec and tessalon as requested to [facility] drug.  Please keep [place] updated with symptoms.

## 2015-05-20 ENCOUNTER — Other Ambulatory Visit: Payer: Self-pay | Admitting: Cardiovascular Disease

## 2015-05-23 ENCOUNTER — Other Ambulatory Visit: Payer: Self-pay | Admitting: Cardiovascular Disease

## 2015-05-24 ENCOUNTER — Telehealth: Payer: Self-pay | Admitting: *Deleted

## 2015-05-24 NOTE — Telephone Encounter (Signed)
-----   Message from Lucille Passy, MD sent at 05/24/2015  9:58 AM EST ----- Regarding: FW: Review lab results Please let him know that his cholesterol looks great. ----- Message -----    From: Ellamae Sia    Sent: 05/24/2015   9:26 AM      To: Lucille Passy, MD Subject: Review lab results                             Patient wanted results, this is I think a corrected result. Thanks, T

## 2015-05-24 NOTE — Telephone Encounter (Signed)
Spoke to pts wife and informed her of results

## 2015-06-03 ENCOUNTER — Telehealth: Payer: Self-pay

## 2015-06-03 ENCOUNTER — Ambulatory Visit (INDEPENDENT_AMBULATORY_CARE_PROVIDER_SITE_OTHER): Payer: BLUE CROSS/BLUE SHIELD | Admitting: Family Medicine

## 2015-06-03 ENCOUNTER — Encounter: Payer: Self-pay | Admitting: Family Medicine

## 2015-06-03 VITALS — BP 104/60 | HR 93 | Temp 99.3°F | Wt 181.5 lb

## 2015-06-03 DIAGNOSIS — I2510 Atherosclerotic heart disease of native coronary artery without angina pectoris: Secondary | ICD-10-CM | POA: Diagnosis not present

## 2015-06-03 DIAGNOSIS — J069 Acute upper respiratory infection, unspecified: Secondary | ICD-10-CM | POA: Diagnosis not present

## 2015-06-03 LAB — POCT INFLUENZA A/B
INFLUENZA B, POC: NEGATIVE
Influenza A, POC: NEGATIVE

## 2015-06-03 MED ORDER — ALBUTEROL SULFATE HFA 108 (90 BASE) MCG/ACT IN AERS: 2.0000 | INHALATION_SPRAY | Freq: Four times a day (QID) | RESPIRATORY_TRACT | Status: DC | PRN

## 2015-06-03 MED ORDER — AZITHROMYCIN 250 MG PO TABS: ORAL_TABLET | ORAL | Status: DC

## 2015-06-03 MED ORDER — BENZONATATE 100 MG PO CAPS: 100.0000 mg | ORAL_CAPSULE | Freq: Two times a day (BID) | ORAL | Status: DC | PRN

## 2015-06-03 NOTE — Progress Notes (Signed)
SUBJECTIVE:  Curtis Campos is a 75 y.o. male who complains of coryza, congestion, productive cough and fever for 2 days. He denies a history of anorexia, dizziness and shortness of breath and admits to a history of asthma. Patient does not smoke cigarettes.  No known contacts.  He did receive his influenza vaccination this year.   Current Outpatient Prescriptions on File Prior to Visit  Medication Sig Dispense Refill  . aspirin 81 MG tablet Take 81 mg by mouth daily. Takes at night     . cetirizine (ZYRTEC) 10 MG tablet Take 1 tablet (10 mg total) by mouth daily. 30 tablet 11  . clonazePAM (KLONOPIN) 0.5 MG tablet Take 1-3 tablets (0.5-1.5 mg total) by mouth at bedtime as needed for anxiety (or sleep). 45 tablet 0  . furosemide (LASIX) 20 MG tablet Take 1 tablet (20 mg total) by mouth daily. 90 tablet 3  . lisinopril (PRINIVIL,ZESTRIL) 10 MG tablet Take 10 mg by mouth daily.     . magnesium oxide (MAG-OX) 400 MG tablet Take 1 tablet (400 mg total) by mouth daily. 90 tablet 3  . metoprolol succinate (TOPROL-XL) 100 MG 24 hr tablet TAKE 1 TABLET ONCE DAILY WITH FOOD 30 tablet 3  . niacin (NIASPAN) 1000 MG CR tablet TAKE 1 TABLET AT BEDTIME. 30 tablet 0  . spironolactone (ALDACTONE) 25 MG tablet TAKE 1 TABLET ONCE DAILY 30 tablet 6  . traMADol (ULTRAM) 50 MG tablet Take 1 tablet (50 mg total) by mouth every 8 (eight) hours as needed. 30 tablet 0  . ZETIA 10 MG tablet TAKE 1 TABLET ONCE DAILY 30 tablet 11   No current facility-administered medications on file prior to visit.    Allergies  Allergen Reactions  . Codeine Nausea Only  . Statins Other (See Comments)    REACTION: maylgias    Past Medical History  Diagnosis Date  . Hyperlipidemia   . Arthritis 03/12/2011  . Renal cancer ([place])   . Rectal cancer ([place])   . Hypertension   . CHF (congestive heart failure) ([place])   . Chronic obstructive lung disease ([place])   . Mitral regurgitation   . Coronary artery disease 03/2013    EF  of 25%, moderate to severe mitral regurgitation. Catheterization showed normal cardiac output with mild pulmonary hypertension. There was 99% stenosis in the mid left circumflex which appeared to be chronic with akinetic basal inferior/posterior wall. Mild LAD and RCA disease.  . Chronic systolic heart failure ([place]) 03/2013    EF of 25% with moderate to severe mitral regurgitation    Past Surgical History  Procedure Laterality Date  . Appendectomy  06/2003  . Resection of rectum    . Hernia repair    . Colostomy    . Cataract surgery    . Cardiac catheterization  03/2013    [facility]    Family History  Problem Relation Age of Onset  . Heart disease Mother   . Heart disease Father   . Hyperlipidemia Father   . Hypertension Father   . Heart attack Brother 33    MI  . Heart disease Sister     stents placed   . Heart disease Sister     CABG    Social History   Social History  . Marital Status: Married    Spouse Name: N/A  . Number of Children: N/A  . Years of Education: N/A   Occupational History  . Not on file.   Social History Main Topics  .  Smoking status: Former Smoker -- 1.00 packs/day for 50 years    Types: Cigarettes    Quit date: 03/25/2013  . Smokeless tobacco: Never Used  . Alcohol Use: No  . Drug Use: No  . Sexual Activity: Yes   Other Topics Concern  . Not on file   Social History Narrative   The PMH, PSH, Social History, Family History, Medications, and allergies have been reviewed in [facility], and have been updated if relevant.  OBJECTIVE: BP 104/60 mmHg  Pulse 93  Temp(Src) 99.3 F (37.4 C) (Oral)  Wt 181 lb 8 oz (82.328 kg)  SpO2 92%  He appears well, vital signs are as noted. Ears normal.  Throat and pharynx normal.  Neck supple. No adenopathy in the neck. Nose is congested. Sinuses non tender.  Scattered expiratory wheezes  ASSESSMENT:  bronchitis  PLAN: Rapid flu negative.  Symptomatic therapy suggested: push fluids, rest and return office  visit prn if symptoms persist or worsen.  eRx sent for zpack , tessalon, and proair inhaler to use as needed for cough and or wheezing.  Call or return to clinic prn if these symptoms worsen or fail to improve as anticipated.

## 2015-06-03 NOTE — Telephone Encounter (Signed)
Pt has appt 06/03/15 at 10:30 with Dr Deborra Medina.

## 2015-06-03 NOTE — Addendum Note (Signed)
Addended by: Modena Nunnery on: 06/03/2015 11:02 AM   Modules accepted: Orders

## 2015-06-03 NOTE — Progress Notes (Signed)
Pre visit review using our clinic review tool, if applicable. No additional management support is needed unless otherwise documented below in the visit note. 

## 2015-06-03 NOTE — Telephone Encounter (Signed)
PLEASE NOTE: All timestamps contained within this report are represented as [place] Standard Time. CONFIDENTIALTY NOTICE: This fax transmission is intended only for the addressee. It contains information that is legally privileged, confidential or otherwise protected from use or disclosure. If you are not the intended recipient, you are strictly prohibited from reviewing, disclosing, copying using or disseminating any of this information or taking any action in reliance on or regarding this information. If you have received this fax in error, please notify us immediately by telephone so that we can arrange for its return to [place]. Phone: [phone], Toll-Free: [phone], Fax: [phone] Page: 1 of 2 Call Id: [id number] [place] Patient Name: Curtis Campos Gender: Male DOB: 1941-01-14 Age: 75 Y 62 M 14 D Return Phone Number: [phone] (Primary), [phone] (Secondary) Address: City/State/Zip: [place] Client [place] [facility] - Client Client Site [place] - Day Physician Arnette Norris Contact Type Call Who Is Calling Patient / Member / Family / Caregiver Call Type Triage / Clinical Caller Name Curt Bears Relationship To Patient Spouse Return Phone Number [phone] (Primary) Chief Complaint Cough Reason for Call Symptomatic / Request for [place] states husband has a cold and coughing a lot since yesterday. Appointment Disposition EMR Appointment Not Necessary Info pasted into Epic No PreDisposition Home Care Translation No Nurse Assessment Nurse: Einar Gip, RN, Neoma Laming Date/Time (Eastern Time): 06/02/2015 10:09:42 AM Confirm and document reason for call. If symptomatic, describe symptoms. You must click the next button to save text entered. ---Patient states he has been coughing and has a headache from coughing. Cough  started Friday night. Non- productive. Stomach is sore from coughing. No fever and some nasal drainage- clear. Has the patient traveled out of the country within the last 30 days? ---No Does the patient have any new or worsening symptoms? ---Yes Will a triage be completed? ---Yes Related visit to physician within the last 2 weeks? ---No Does the PT have any chronic conditions? (i.e. diabetes, asthma, etc.) ---Yes List chronic conditions. ---hypertension, previous mi, colo-rectal cancer- remission. Is this a behavioral health or substance abuse call? ---No Guidelines Guideline Title Affirmed Question Affirmed Notes Nurse Date/Time (Eastern Time) Common Cold Cold with no complications (all triage questions negative) Daoud, RN, Neoma Laming 06/02/2015 10:12:51 AM Disp. Time Eilene Ghazi Time) Disposition Final User PLEASE NOTE: All timestamps contained within this report are represented as [place] Standard Time. CONFIDENTIALTY NOTICE: This fax transmission is intended only for the addressee. It contains information that is legally privileged, confidential or otherwise protected from use or disclosure. If you are not the intended recipient, you are strictly prohibited from reviewing, disclosing, copying using or disseminating any of this information or taking any action in reliance on or regarding this information. If you have received this fax in error, please notify us immediately by telephone so that we can arrange for its return to [place]. Phone: [phone], Toll-Free: [phone], Fax: [phone] Page: 2 of 2 Call Id: [id number] 06/02/2015 10:17:08 AM Home Care Yes Einar Gip, RN, Garrel Ridgel Understands: Yes Disagree/Comply: Comply Care Advice Given Per Guideline HOME CARE: You should be able to treat this at home. REASSURANCE: * It sounds like an uncomplicated cold that we can treat at home. * Colds are very common and may make you feel uncomfortable. * Colds are caused by viruses, and no  medicine or 'shot' will cure an uncomplicated cold. * Colds are usually not serious.  FOR A STUFFY NOSE - USE NASAL WASHES: * Introduction: Saline (salt water) nasal irrigation (nasal wash) is an effective and simple home remedy for treating stuffy nose and sinus congestion. The nose can be irrigated by pouring, spraying, or squirting salt water into the nose and then letting it run back out. * How it Helps: The salt water rinses out excess mucus, washes out any irritants (dust, allergens) that might be present, and moistens the nasal cavity. * Methods: There are several ways to perform nasal irrigation. You can use a saline nasal spray bottle (available over-the-counter), a rubber ear syringe, a medical syringe without the needle, or a NETI POT. STEP-BY-STEP INSTRUCTIONS: * STEP 1: Lean over a sink. * STEP 2: Gently squirt or spray warm salt water into one of your nostrils. * STEP 3: Some of the water may run into the back of your throat. Spit this out. If you swallow the salt water it will not hurt you. * STEP 4: Blow your nose to clean out the water and mucus. * STEP 5: Repeat steps 1-4 for the other nostril. You can do this a couple times a day if it seems to help you. HOW TO MAKE SALINE (SALT WATER) NASAL WASH: * You can make your own saline nasal wash. * Add 1/2 tsp of table salt to 1 cup (8 oz; 240 ml) of warm water. * You should use sterile, distilled, or previously boiled water for nasal irrigation. FOR A RUNNY NOSE - BLOW YOUR NOSE: * If the skin around your nostrils gets irritated, apply a tiny amount of petroleum ointment to the nasal openings once or twice a day. MEDICINES FOR STUFFY OR RUNNY NOSE: * Most cold medicines that are available over-the-counter (OTC) are not helpful. * Antihistamines: Are only helpful if you also have nasal allergies. * If you have a very runny nose and you really think you need a medicine, you can try using a nasal decongestant for a couple days. TREATMENT  FOR ASSOCIATED SYMPTOMS OF COLDS: * Sore throat: throat lozenges, hard candy or warm chicken broth. * For muscle aches, headaches, or moderate fever (over 101 degrees F) (38.9 C) use acetaminophen every 4 hours. * Cough: use cough drops. * Hydrate: drink extra liquids. HUMIDIFIER: If the air in your home is dry, use a humidifier. CONTAGIOUSNESS: * The cold virus is present in your nasal secretions. * Cover your nose and mouth with a tissue when you sneeze or cough. * Wash your hands frequently with soap and water. * You can return to work or school after the fever is gone and you feel well enough to participate in normal activities. EXPECTED COURSE: * Fever 2-3 days * Nasal discharge 7-14 days * Cough 2-3 weeks. CALL BACK IF: * Fever lasts over 3 days * Runny nose lasts over 10 days * You become short of breath * You become worse. CARE ADVICE given per Colds (Adult) guideline.

## 2015-06-04 ENCOUNTER — Encounter: Payer: Self-pay | Admitting: Cardiovascular Disease

## 2015-06-04 ENCOUNTER — Ambulatory Visit (INDEPENDENT_AMBULATORY_CARE_PROVIDER_SITE_OTHER): Payer: BLUE CROSS/BLUE SHIELD | Admitting: Cardiovascular Disease

## 2015-06-04 VITALS — BP 140/80 | HR 94 | Ht 66.0 in | Wt 179.0 lb

## 2015-06-04 DIAGNOSIS — I10 Essential (primary) hypertension: Secondary | ICD-10-CM | POA: Diagnosis not present

## 2015-06-04 DIAGNOSIS — I2510 Atherosclerotic heart disease of native coronary artery without angina pectoris: Secondary | ICD-10-CM | POA: Diagnosis not present

## 2015-06-04 DIAGNOSIS — I509 Heart failure, unspecified: Secondary | ICD-10-CM

## 2015-06-04 DIAGNOSIS — I429 Cardiomyopathy, unspecified: Secondary | ICD-10-CM | POA: Diagnosis not present

## 2015-06-04 NOTE — Patient Instructions (Signed)
Medication Instructions: Continue same medications.   Labwork: None.   Procedures/Testing: None.   Follow-Up: 6 months with Dr. Arida.   Any Additional Special Instructions Will Be Listed Below (If Applicable).     If you need a refill on your cardiac medications before your next appointment, please call your pharmacy.   

## 2015-06-04 NOTE — Progress Notes (Signed)
Cardiology Office Note   Date:  06/04/2015   ID:  Curtis Campos, DOB 1940-09-23, MRN [id number]  PCP:  Arnette Norris, MD  Cardiologist:   Kathlyn Sacramento, MD   Chief Complaint  Patient presents with  . other    6 month follow up. Meds reviewed by the patient verbally. Pt. c/o having a terrible cold & cough.       History of Present Illness: Curtis Campos is a 75 y.o. male who presents for a followup visit regarding chronic systolic heart failure, coronary artery disease and mitral regurgitation.  He has known history of hypertension, tobacco use and COPD.   He was hospitalized in January of 2015 for congestive heart failure. Echocardiogram in 03/2013 showed an ejection fraction of 25% with inferoposterior akinesis and severe mitral regurgitation with posterior jet.  A right and left cardiac catheterization showed severe one-vessel coronary artery disease with a subtotal occlusion of the mid left circumflex which appeared to be chronic. TEE was performed which showed that the mitral valve was normal in structure and regurgitation was moderate after optimizing his volume status.   Repeat echocardiogram in April, 2015 showed an ejection fraction of 30-35%, mild aortic stenosis and moderate mitral regurgitation. He was not interested ICD placement. He has been stable from a cardiac standpoint. However, he developed symptoms of upper respiratory tract infection recently since Friday with significant fever, cough and muscle aches. He was seen by Dr. Deborra Medina with negative testing for influenza. He was started on Z-Pak and some other symptomatic medications. He is starting to feel better. His heart rate is above baseline but he has been using albuterol.    Past Medical History  Diagnosis Date  . Hyperlipidemia   . Arthritis 03/12/2011  . Renal cancer ([place])   . Rectal cancer ([place])   . Hypertension   . CHF (congestive heart failure) ([place])   . Chronic obstructive lung disease ([place])   .  Mitral regurgitation   . Coronary artery disease 03/2013    EF of 25%, moderate to severe mitral regurgitation. Catheterization showed normal cardiac output with mild pulmonary hypertension. There was 99% stenosis in the mid left circumflex which appeared to be chronic with akinetic basal inferior/posterior wall. Mild LAD and RCA disease.  . Chronic systolic heart failure ([place]) 03/2013    EF of 25% with moderate to severe mitral regurgitation    Past Surgical History  Procedure Laterality Date  . Appendectomy  06/2003  . Resection of rectum    . Hernia repair    . Colostomy    . Cataract surgery    . Cardiac catheterization  03/2013    [facility]     Current Outpatient Prescriptions  Medication Sig Dispense Refill  . albuterol (PROVENTIL HFA;VENTOLIN HFA) 108 (90 Base) MCG/ACT inhaler Inhale 2 puffs into the lungs every 6 (six) hours as needed. 1 Inhaler 0  . aspirin 81 MG tablet Take 81 mg by mouth daily. Takes at night     . azithromycin (ZITHROMAX) 250 MG tablet Take two the first day, then take one by mouth each day after. 6 tablet 0  . benzonatate (TESSALON) 100 MG capsule Take 1 capsule (100 mg total) by mouth 2 (two) times daily as needed for cough. 20 capsule 0  . cetirizine (ZYRTEC) 10 MG tablet Take 1 tablet (10 mg total) by mouth daily. 30 tablet 11  . clonazePAM (KLONOPIN) 0.5 MG tablet Take 1-3 tablets (0.5-1.5 mg total) by mouth at bedtime  as needed for anxiety (or sleep). 45 tablet 0  . furosemide (LASIX) 20 MG tablet Take 1 tablet (20 mg total) by mouth daily. 90 tablet 3  . lisinopril (PRINIVIL,ZESTRIL) 10 MG tablet Take 10 mg by mouth daily.     . magnesium oxide (MAG-OX) 400 MG tablet Take 1 tablet (400 mg total) by mouth daily. 90 tablet 3  . metoprolol succinate (TOPROL-XL) 100 MG 24 hr tablet TAKE 1 TABLET ONCE DAILY WITH FOOD 30 tablet 3  . niacin (NIASPAN) 1000 MG CR tablet TAKE 1 TABLET AT BEDTIME. 30 tablet 0  . spironolactone (ALDACTONE) 25 MG tablet TAKE 1 TABLET  ONCE DAILY 30 tablet 6  . traMADol (ULTRAM) 50 MG tablet Take 1 tablet (50 mg total) by mouth every 8 (eight) hours as needed. 30 tablet 0  . ZETIA 10 MG tablet TAKE 1 TABLET ONCE DAILY 30 tablet 11   No current facility-administered medications for this visit.    Allergies:   Codeine and Statins    Social History:  The patient  reports that he quit smoking about 2 years ago. His smoking use included Cigarettes. He has a 50 pack-year smoking history. He has never used smokeless tobacco. He reports that he does not drink alcohol or use illicit drugs.   Family History:  The patient's family history includes Heart attack (age of onset: 45) in his brother; Heart disease in his father, mother, sister, and sister; Hyperlipidemia in his father; Hypertension in his father.    ROS:  Please see the history of present illness.   Otherwise, review of systems are positive for none.   All other systems are reviewed and negative.    PHYSICAL EXAM: VS:  BP 140/80 mmHg  Pulse 94  Ht '5\' 6"'$  (1.676 m)  Wt 179 lb (81.194 kg)  BMI 28.91 kg/m2 , BMI Body mass index is 28.91 kg/(m^2). GEN: Well nourished, well developed, in no acute distress HEENT: normal Neck: no JVD, carotid bruits, or masses Cardiac: RRR; no murmurs, rubs, or gallops,no edema  Respiratory:  clear to auscultation bilaterally, normal work of breathing GI: soft, nontender, nondistended, + BS MS: no deformity or atrophy Skin: warm and dry, no rash Neuro:  Strength and sensation are intact Psych: euthymic mood, full affect   EKG:  EKG is ordered today. The ekg ordered today demonstrates sinus rhythm with sinus arrhythmia. No significant ST or T wave changes.   Recent Labs: 03/14/2015: ALT 14; BUN 17.6; Creatinine 0.9; HGB 12.3*; Platelets 279; Potassium 4.9; Sodium 131*    Lipid Panel    Component Value Date/Time   CHOL 192 05/01/2015 1303   TRIG 108.0 05/01/2015 1303   HDL 72.50 05/01/2015 1303   CHOLHDL 3 05/01/2015 1303    VLDL 21.6 05/01/2015 1303   LDLCALC 110* 05/01/2015 1303   LDLDIRECT 129.7 04/14/2012 0756      Wt Readings from Last 3 Encounters:  06/04/15 179 lb (81.194 kg)  06/03/15 181 lb 8 oz (82.328 kg)  05/01/15 184 lb (83.462 kg)         ASSESSMENT AND PLAN:  1.  Upper respiratory tract infection with possible bronchitis: He was restarted on treatment by Dr. Deborra Medina. His lung exam is not suggestive of pneumonia. Given his cardiac history, I advised him to have a low threshold to go to the emergency room if his symptoms worsen.  2. Chronic systolic heart failure: I don't see evidence of volume overload. Continue treatment with this in April, Toprol and spironolactone.  Most recent ejection fraction was 30-35%.  3. Coronary artery disease: Currently with no anginal symptoms. Continue medical therapy.  4. Essential hypertension: Blood pressure is reasonably controlled on current medications.  5. Hyperlipidemia: He is intolerant to statins and currently is on Zetia.        Disposition:   FU with me in 6 months  Signed,  Kathlyn Sacramento, MD  06/04/2015 3:17 PM    [place]

## 2015-06-07 ENCOUNTER — Telehealth: Payer: Self-pay | Admitting: Family Medicine

## 2015-06-07 NOTE — Telephone Encounter (Signed)
Spoke to patient's wife.  Oxygen saturation continues to be in the upper 70's.  Patient appears to be doing well otherwise despite cough and mild SOB on exertion.  She is taking him to [facility] ED for evaluation.

## 2015-06-07 NOTE — Telephone Encounter (Signed)
Patient Name: Curtis Campos  DOB: 1941-01-24    Initial Comment Caller States her husbands oxygen level is down to 79, his legs are weak   Nurse Assessment  Nurse: Mallie Mussel, RN, Alveta Heimlich Date/Time (Eastern Time): 06/07/2015 8:23:37 AM  Confirm and document reason for call. If symptomatic, describe symptoms. You must click the next button to save text entered. ---Caller states that her husband's 02 levels are down to 79. He is not on 02 at present. The last time he was like this, he had pneumonia. He was seen Monday and was fine. He has used his albuterol inhaler. Denies difficulty breathing. His sats are low and he is weak. He has been coughing quite a bit. He has been coughing up a greenish phlegm. He gets SOB with exertion.  Has the patient traveled out of the country within the last 30 days? ---No  Does the patient have any new or worsening symptoms? ---Yes  Will a triage be completed? ---Yes  Related visit to physician within the last 2 weeks? ---No  Does the PT have any chronic conditions? (i.e. diabetes, asthma, etc.) ---Yes  List chronic conditions. ---HTN,  Is this a behavioral health or substance abuse call? ---No     Guidelines    Guideline Title Affirmed Question Affirmed Notes  Breathing Difficulty [1] MILD difficulty breathing (e.g., minimal/no SOB at rest, SOB with walking, pulse <100) AND [2] NEW-onset or WORSE than normal    Final Disposition User   See Physician within 4 Hours (or PCP triage) Mallie Mussel, RN, Alveta Heimlich    Comments  No appointments available at either [facility] or [place]. Caller decided to take him to [facility] - ED   Disagree/Comply: Comply

## 2015-06-10 ENCOUNTER — Ambulatory Visit (INDEPENDENT_AMBULATORY_CARE_PROVIDER_SITE_OTHER)
Admission: RE | Admit: 2015-06-10 | Discharge: 2015-06-10 | Disposition: A | Discharge status: Home / Self Care | Payer: BLUE CROSS/BLUE SHIELD | Source: Ambulatory Visit | Attending: Family Medicine | Admitting: Family Medicine

## 2015-06-10 ENCOUNTER — Ambulatory Visit (INDEPENDENT_AMBULATORY_CARE_PROVIDER_SITE_OTHER): Payer: BLUE CROSS/BLUE SHIELD | Admitting: Family Medicine

## 2015-06-10 VITALS — BP 98/58 | HR 93 | Temp 98.0°F | Wt 174.8 lb

## 2015-06-10 DIAGNOSIS — J069 Acute upper respiratory infection, unspecified: Secondary | ICD-10-CM | POA: Diagnosis not present

## 2015-06-10 DIAGNOSIS — R5383 Other fatigue: Secondary | ICD-10-CM | POA: Insufficient documentation

## 2015-06-10 DIAGNOSIS — R630 Anorexia: Secondary | ICD-10-CM

## 2015-06-10 DIAGNOSIS — Z8701 Personal history of pneumonia (recurrent): Secondary | ICD-10-CM | POA: Diagnosis not present

## 2015-06-10 DIAGNOSIS — I2510 Atherosclerotic heart disease of native coronary artery without angina pectoris: Secondary | ICD-10-CM

## 2015-06-10 DIAGNOSIS — R05 Cough: Secondary | ICD-10-CM | POA: Diagnosis not present

## 2015-06-10 DIAGNOSIS — R509 Fever, unspecified: Secondary | ICD-10-CM | POA: Diagnosis not present

## 2015-06-10 NOTE — Progress Notes (Signed)
Pre visit review using our clinic review tool, if applicable. No additional management support is needed unless otherwise documented below in the visit note. 

## 2015-06-10 NOTE — Progress Notes (Signed)
Subjective:   Patient ID: Curtis Campos, male    DOB: Mar 18, 1941, 75 y.o.   MRN: [id number]  Curtis Campos is a pleasant 75 y.o. year old male who presents to clinic today with his wife for Fatigue and Anorexia  on 06/10/2015  HPI:  Fatigue and decreased appetite-  I saw him on 06/03/15 for URI symptoms. Note reviewed.  Had been febrile. Wheezes on exam, rapid flu neg. Treated with zpack, proair inhaler and tessalon as needed.  He then saw his cardiologist, Dr. Fletcher Anon, on 06/04/15. Note reviewed. No changes were made.  Feels congestion is better. Still coughing.  Fever has resolved. Not dizzy.  No appetite.  Lab Results  Component Value Date   WBC 8.6 03/14/2015   HGB 12.3* 03/14/2015   HCT 36.7* 03/14/2015   MCV 91.1 03/14/2015   PLT 279 03/14/2015   Lab Results  Component Value Date   NA 131* 03/14/2015   K 4.9 03/14/2015   CL 94* 11/20/2014   CO2 24 03/14/2015   Lab Results  Component Value Date   CREATININE 0.9 03/14/2015     Current Outpatient Prescriptions on File Prior to Visit  Medication Sig Dispense Refill  . albuterol (PROVENTIL HFA;VENTOLIN HFA) 108 (90 Base) MCG/ACT inhaler Inhale 2 puffs into the lungs every 6 (six) hours as needed. 1 Inhaler 0  . aspirin 81 MG tablet Take 81 mg by mouth daily. Takes at night     . benzonatate (TESSALON) 100 MG capsule Take 1 capsule (100 mg total) by mouth 2 (two) times daily as needed for cough. 20 capsule 0  . cetirizine (ZYRTEC) 10 MG tablet Take 1 tablet (10 mg total) by mouth daily. 30 tablet 11  . clonazePAM (KLONOPIN) 0.5 MG tablet Take 1-3 tablets (0.5-1.5 mg total) by mouth at bedtime as needed for anxiety (or sleep). 45 tablet 0  . furosemide (LASIX) 20 MG tablet Take 1 tablet (20 mg total) by mouth daily. 90 tablet 3  . lisinopril (PRINIVIL,ZESTRIL) 10 MG tablet Take 10 mg by mouth daily.     . magnesium oxide (MAG-OX) 400 MG tablet Take 1 tablet (400 mg total) by mouth daily. 90 tablet 3  . metoprolol  succinate (TOPROL-XL) 100 MG 24 hr tablet TAKE 1 TABLET ONCE DAILY WITH FOOD 30 tablet 3  . niacin (NIASPAN) 1000 MG CR tablet TAKE 1 TABLET AT BEDTIME. 30 tablet 0  . spironolactone (ALDACTONE) 25 MG tablet TAKE 1 TABLET ONCE DAILY 30 tablet 6  . traMADol (ULTRAM) 50 MG tablet Take 1 tablet (50 mg total) by mouth every 8 (eight) hours as needed. 30 tablet 0  . ZETIA 10 MG tablet TAKE 1 TABLET ONCE DAILY 30 tablet 11   No current facility-administered medications on file prior to visit.    Allergies  Allergen Reactions  . Codeine Nausea Only  . Statins Other (See Comments)    REACTION: maylgias    Past Medical History  Diagnosis Date  . Hyperlipidemia   . Arthritis 03/12/2011  . Renal cancer ([place])   . Rectal cancer ([place])   . Hypertension   . CHF (congestive heart failure) ([place])   . Chronic obstructive lung disease ([place])   . Mitral regurgitation   . Coronary artery disease 03/2013    EF of 25%, moderate to severe mitral regurgitation. Catheterization showed normal cardiac output with mild pulmonary hypertension. There was 99% stenosis in the mid left circumflex which appeared to be chronic with akinetic basal inferior/posterior wall.  Mild LAD and RCA disease.  . Chronic systolic heart failure ([place]) 03/2013    EF of 25% with moderate to severe mitral regurgitation    Past Surgical History  Procedure Laterality Date  . Appendectomy  06/2003  . Resection of rectum    . Hernia repair    . Colostomy    . Cataract surgery    . Cardiac catheterization  03/2013    [facility]    Family History  Problem Relation Age of Onset  . Heart disease Mother   . Heart disease Father   . Hyperlipidemia Father   . Hypertension Father   . Heart attack Brother 33    MI  . Heart disease Sister     stents placed   . Heart disease Sister     CABG    Social History   Social History  . Marital Status: Married    Spouse Name: N/A  . Number of Children: N/A  . Years of Education: N/A    Occupational History  . Not on file.   Social History Main Topics  . Smoking status: Former Smoker -- 1.00 packs/day for 50 years    Types: Cigarettes    Quit date: 03/25/2013  . Smokeless tobacco: Never Used  . Alcohol Use: No  . Drug Use: No  . Sexual Activity: Yes   Other Topics Concern  . Not on file   Social History Narrative   The PMH, PSH, Social History, Family History, Medications, and allergies have been reviewed in [facility], and have been updated if relevant.    Review of Systems  Constitutional: Positive for appetite change and fatigue.  Respiratory: Positive for cough. Negative for shortness of breath, wheezing and stridor.   Cardiovascular: Negative.   Gastrointestinal: Negative.   Musculoskeletal: Negative.   Skin: Negative.   Neurological: Negative.   Psychiatric/Behavioral: Negative.   All other systems reviewed and are negative.      Objective:    BP 98/58 mmHg  Pulse 93  Temp(Src) 98 F (36.7 C) (Oral)  Wt 174 lb 12 oz (79.266 kg)  SpO2 92%  BP Readings from Last 3 Encounters:  06/10/15 98/58  06/04/15 140/80  06/03/15 104/60    Wt Readings from Last 3 Encounters:  06/10/15 174 lb 12 oz (79.266 kg)  06/04/15 179 lb (81.194 kg)  06/03/15 181 lb 8 oz (82.328 kg)    Physical Exam  Constitutional: He is oriented to person, place, and time. He appears well-developed and well-nourished. No distress.  HENT:  Head: Atraumatic.  Eyes: Conjunctivae are normal.  Cardiovascular: Normal rate.   Pulmonary/Chest: Effort normal and breath sounds normal. No respiratory distress. He has no wheezes.  Musculoskeletal: Normal range of motion.  Neurological: He is alert and oriented to person, place, and time. No cranial nerve deficit.  Skin: Skin is warm and dry. He is not diaphoretic.  Psychiatric: He has a normal mood and affect. His behavior is normal. Judgment and thought content normal.  Nursing note and vitals reviewed.         Assessment &  Plan:   Other fatigue  Decreased appetite No Follow-up on file.

## 2015-06-10 NOTE — Assessment & Plan Note (Signed)
New with decreased appetite and hypotension. ? Due to low blood pressure. D/c lisinopril 10 mg daily.  They have BP cuff at home.  He will check daily and keep [place] updated. CXR today although wheezes resolved on exam. The patient indicates understanding of these issues and agrees with the plan.

## 2015-06-10 NOTE — Patient Instructions (Signed)
Good to see you. Please stop taking Lisinopril 10 mg daily. We will call you with your xray results.

## 2015-06-12 ENCOUNTER — Other Ambulatory Visit: Payer: Self-pay | Admitting: *Deleted

## 2015-06-12 MED ORDER — CLONAZEPAM 0.5 MG PO TABS: 0.5000 mg | ORAL_TABLET | Freq: Every evening | ORAL | Status: DC | PRN

## 2015-06-12 NOTE — Telephone Encounter (Signed)
Rx called in to requested pharmacy 

## 2015-06-12 NOTE — Telephone Encounter (Signed)
Pt recently in for f/u but mood disorder not discussed.

## 2015-06-12 NOTE — Telephone Encounter (Signed)
Note from 05/2015 reviewed. Ok to phone in Clonazepam

## 2015-06-18 ENCOUNTER — Other Ambulatory Visit: Payer: Self-pay | Admitting: Cardiovascular Disease

## 2015-06-18 ENCOUNTER — Other Ambulatory Visit: Payer: Self-pay | Admitting: Family Medicine

## 2015-06-26 ENCOUNTER — Other Ambulatory Visit: Payer: Self-pay | Admitting: Family Medicine

## 2015-07-30 ENCOUNTER — Other Ambulatory Visit: Payer: Self-pay | Admitting: *Deleted

## 2015-07-30 MED ORDER — CLONAZEPAM 0.5 MG PO TABS: 0.5000 mg | ORAL_TABLET | Freq: Every evening | ORAL | Status: DC | PRN

## 2015-07-30 NOTE — Telephone Encounter (Signed)
Pt has not had any recent f/u for anxiety

## 2015-07-30 NOTE — Telephone Encounter (Signed)
Rx called in to requested pharmacy 

## 2015-08-19 ENCOUNTER — Other Ambulatory Visit: Payer: Self-pay | Admitting: Family Medicine

## 2015-09-02 ENCOUNTER — Other Ambulatory Visit: Payer: Self-pay | Admitting: Cardiovascular Disease

## 2015-09-13 ENCOUNTER — Other Ambulatory Visit: Payer: Self-pay | Admitting: *Deleted

## 2015-09-13 MED ORDER — CLONAZEPAM 0.5 MG PO TABS: 0.5000 mg | ORAL_TABLET | Freq: Every evening | ORAL | Status: DC | PRN

## 2015-09-13 NOTE — Telephone Encounter (Signed)
Pt has not had any recent f/u for anxiety. pls advise

## 2015-09-13 NOTE — Addendum Note (Signed)
Addended by: Modena Nunnery on: 09/13/2015 12:03 PM   Modules accepted: Orders

## 2015-09-13 NOTE — Telephone Encounter (Signed)
Ok to refill.  Discussed his insomnia on 2/1

## 2015-09-13 NOTE — Telephone Encounter (Signed)
Rx called in to requested pharmacy 

## 2015-09-13 NOTE — Addendum Note (Signed)
Addended by: Modena Nunnery on: 09/13/2015 02:51 PM   Modules accepted: Orders

## 2015-09-24 ENCOUNTER — Other Ambulatory Visit: Payer: Self-pay | Admitting: Family Medicine

## 2015-10-14 IMAGING — CR DG CHEST 2V
2 series · 2 of 2 positions shown · non-contrast
Comparison: None.

CLINICAL DATA: Shortness of breath, history of tobacco use

EXAM:
CHEST  2 VIEW

[view not recorded (1 of 2)]
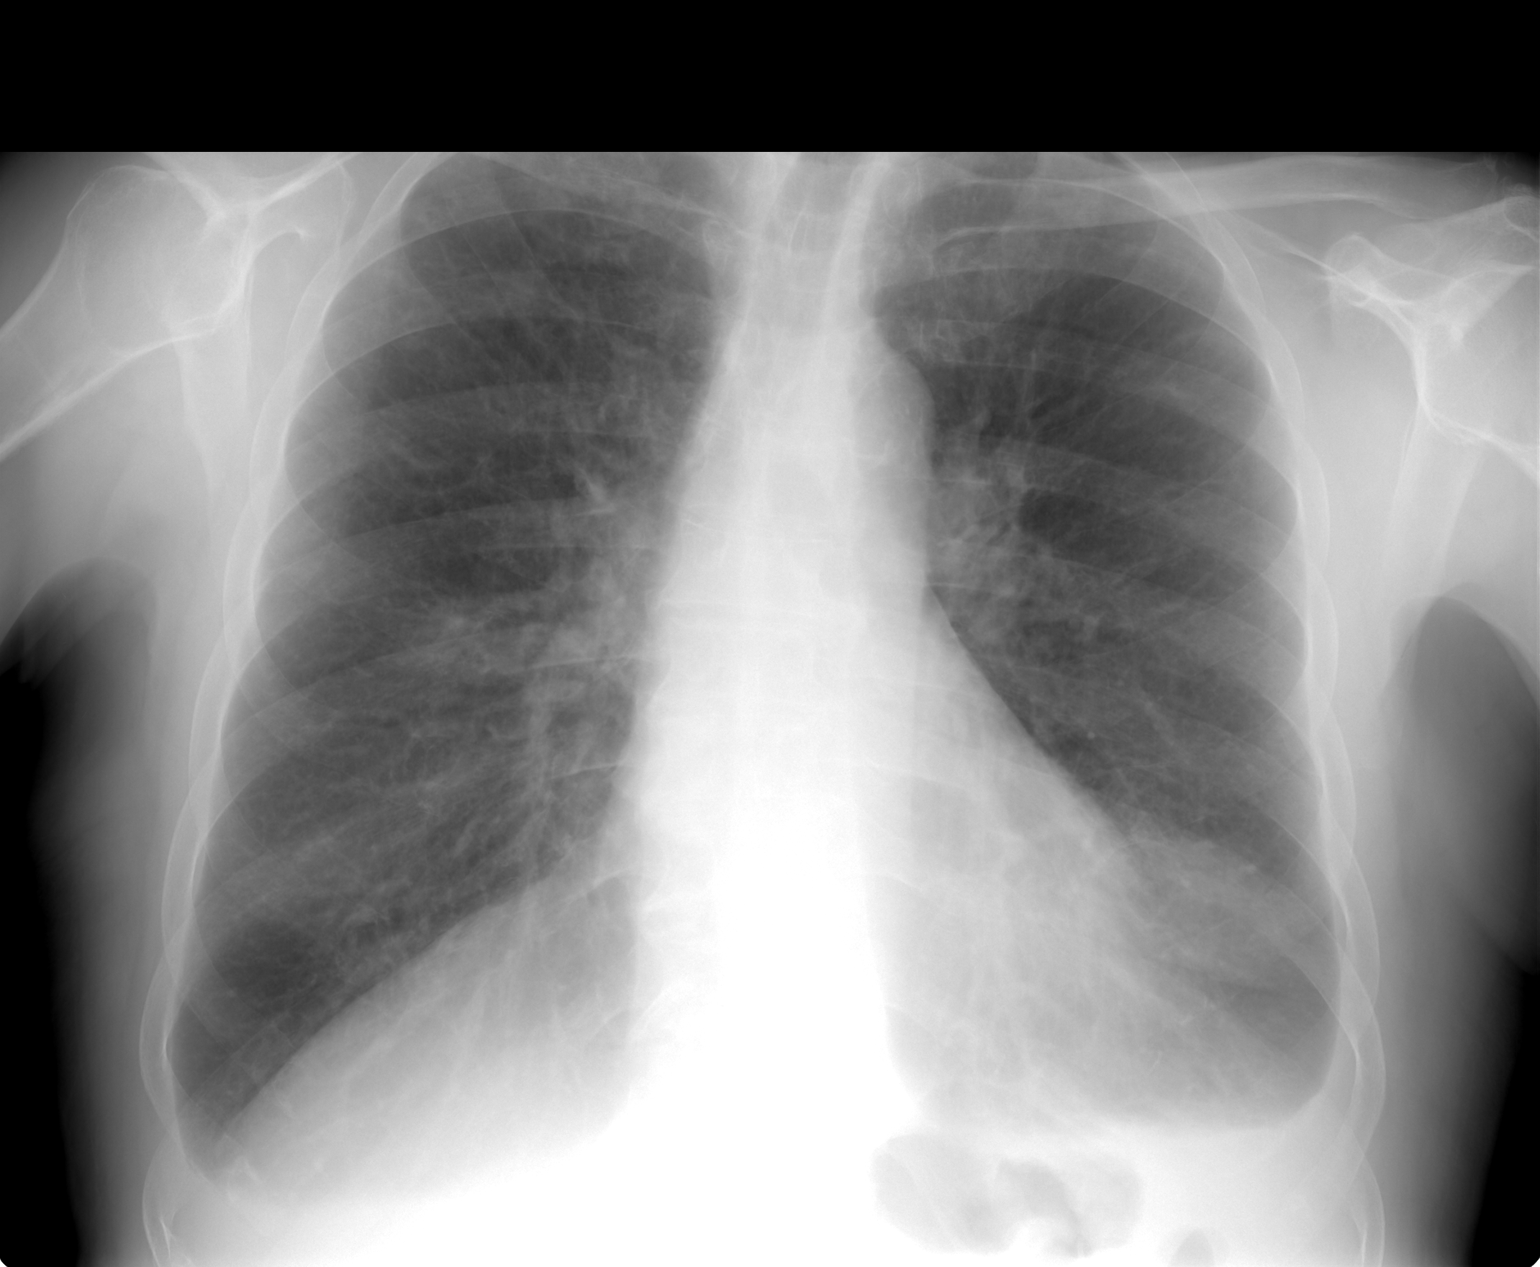

[view not recorded (2 of 2)]
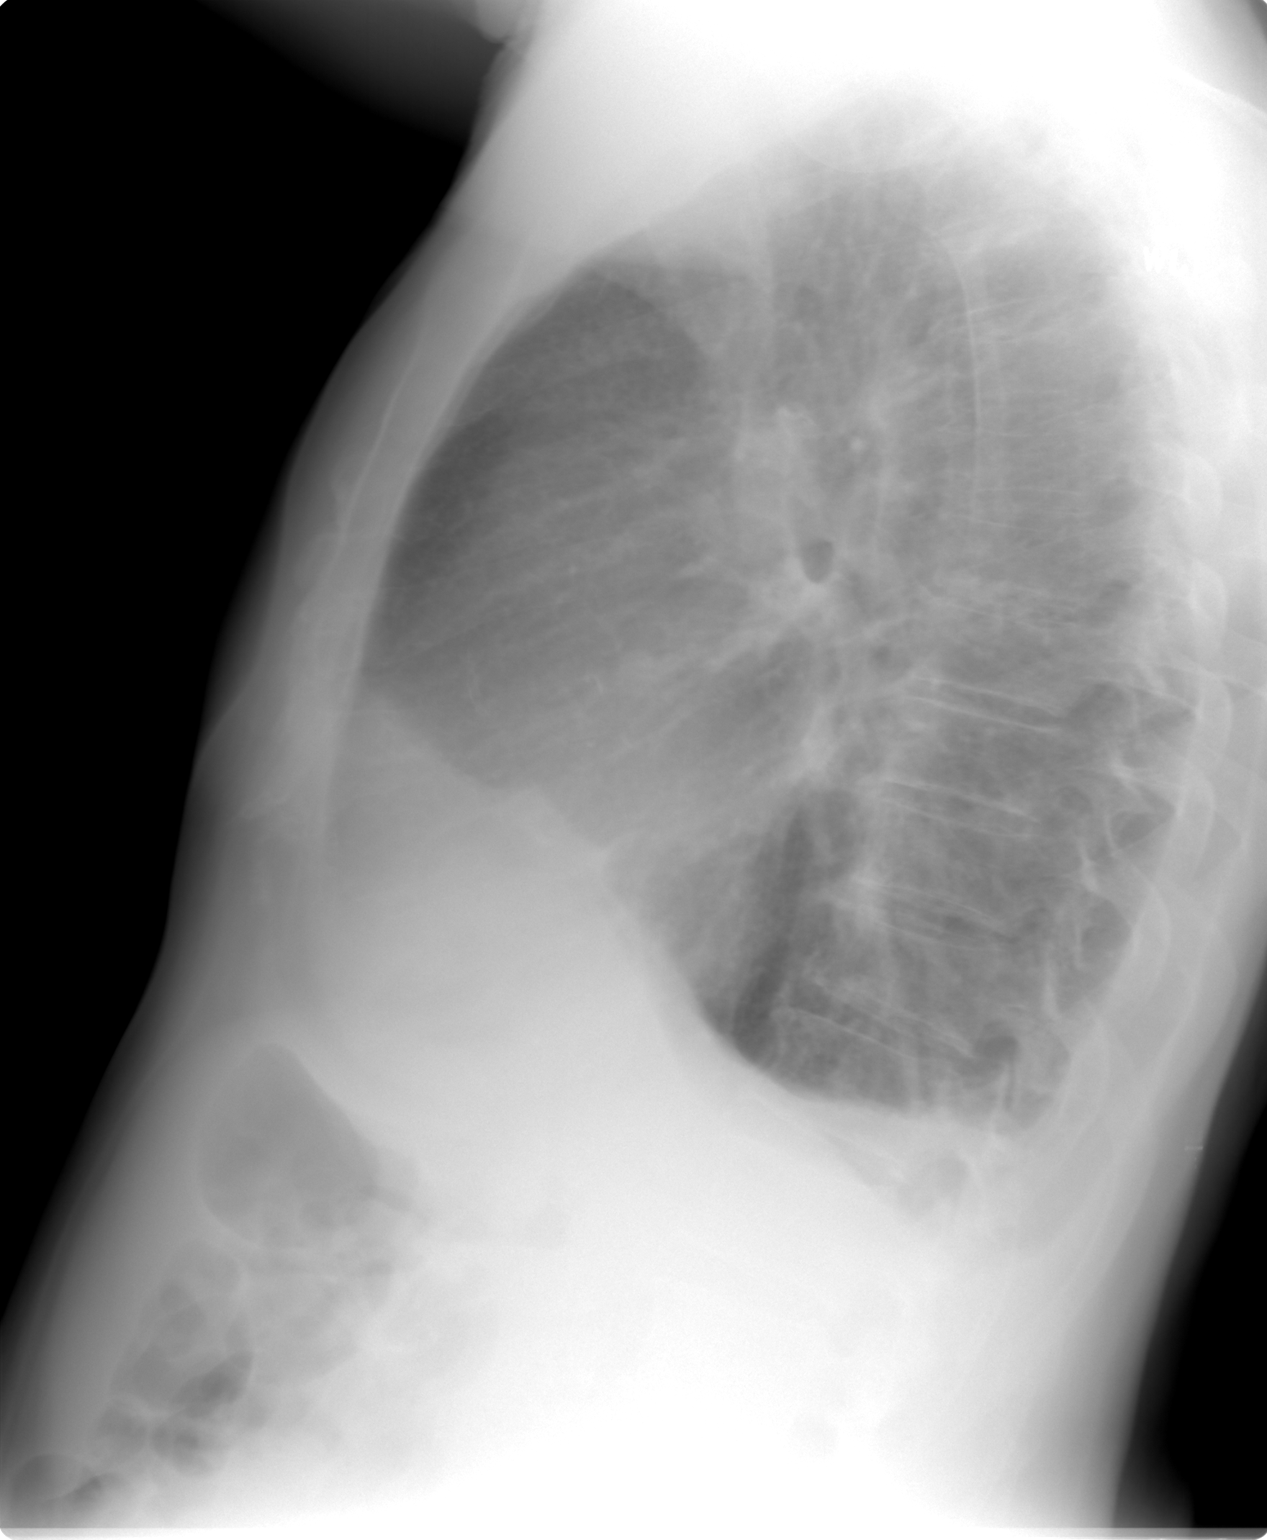

[2 of 2 positions shown; findings below may reference images not displayed]

FINDINGS: The cardiac silhouette is within normal limits. The lungs are
hyperinflated. There is blunting of the costophrenic angles.
Ill-defined areas of increased density projects within the region of
the lingula, right perihilar region, and possibly within the right
middle lobe. No focal reason consolidation appreciated. The
visualized osseous structures demonstrate mild dextroscoliosis
within the thoracic spine and are otherwise unremarkable.
IMPRESSION: Multifocal ill-defined areas of increased density, lingula, right
middle lobe, and right perihilar region. In the presence of signs
and symptoms of infection multifocal pneumonitis is of diagnostic
consideration. Repeat surveillance evaluation status post
appropriate therapeutic regimen is recommended. In the absence of
signs and symptoms of infection further evaluation with chest CT is
recommended to further characterize these findings. There also
findings which appear reflect very small bilateral effusions.

Findings consistent with component of COPD.

## 2015-10-22 IMAGING — CT CT CHEST W/ CM
2 of 4 series · 15 of 36 positions shown, 18 images · IV contrast (Omnipaque 300)
Comparison: Chest x-ray 02/21/2013 and prior CT chest 06/30/2007

CLINICAL DATA: Abnormal chest x-ray.

EXAM:
CT CHEST WITH CONTRAST
TECHNIQUE: Multidetector CT imaging of the chest was performed during
intravenous contrast administration.
CONTRAST:  80 mL of Omnipaque 300 IV

[Series 2: chest routine with · axial · 0.73mm/px · z∈[-306,-26]mm · 12 of 68 slices shown, 15 images]
[im 6/68  mediastinal]
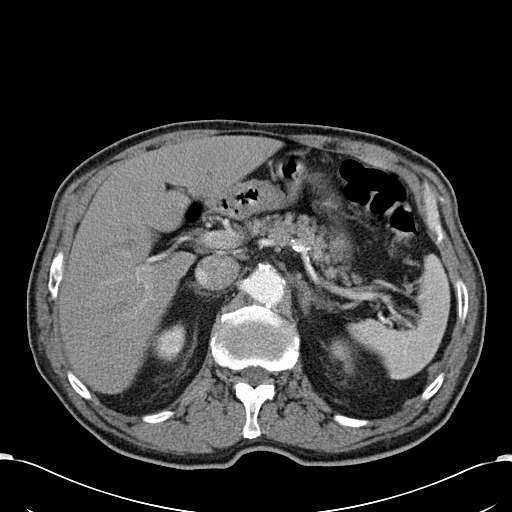
[im 6/68  lung]
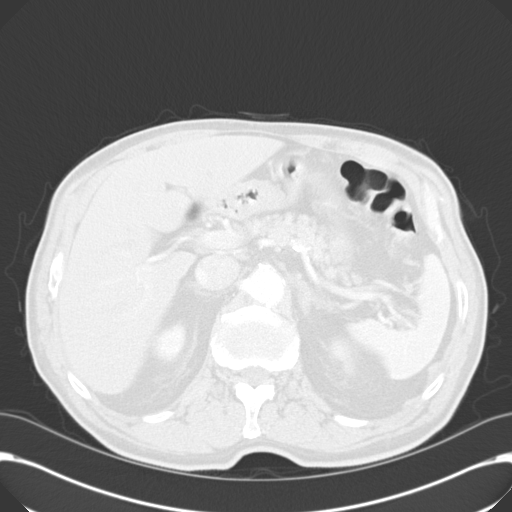
[im 11/68  lung]
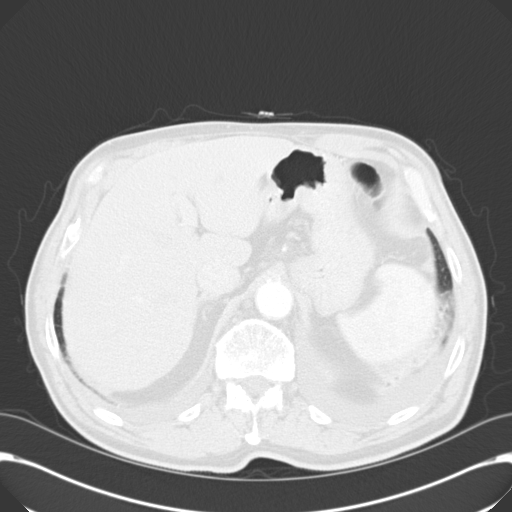
[im 16/68  lung]
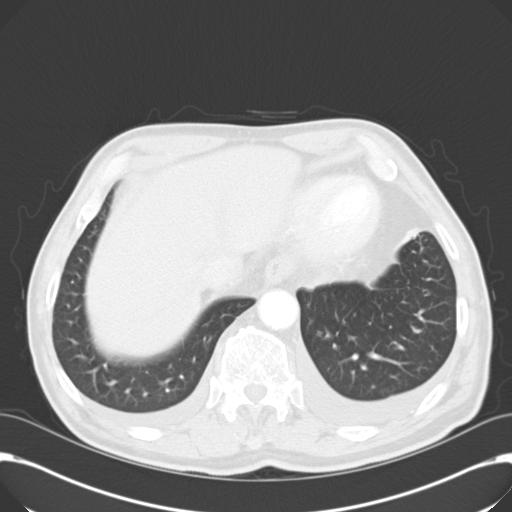
[im 21/68  lung]
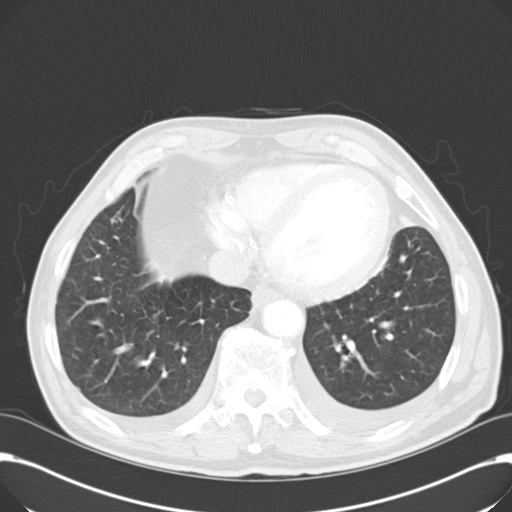
[im 26/68  mediastinal]
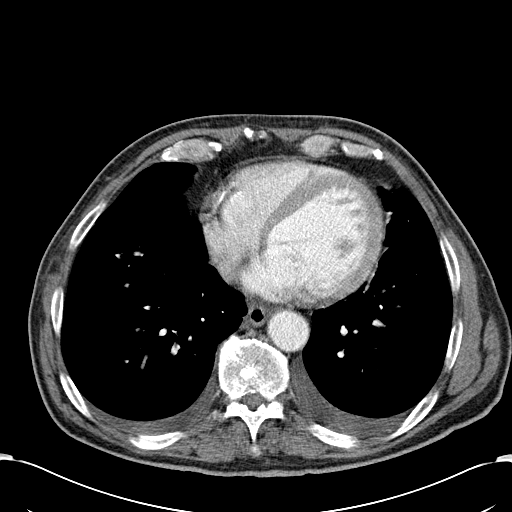
[im 26/68  lung]
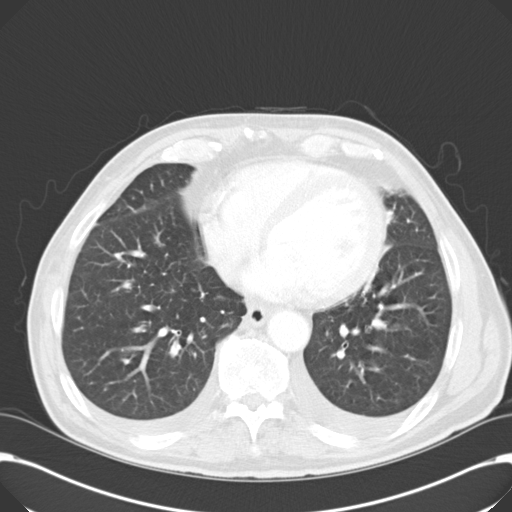
[im 31/68  lung]
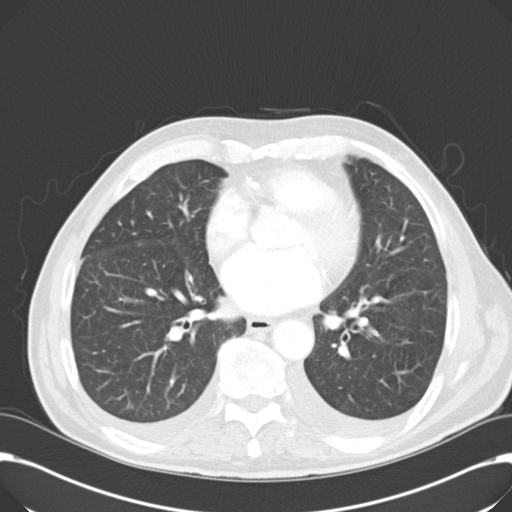
[im 37/68  lung]
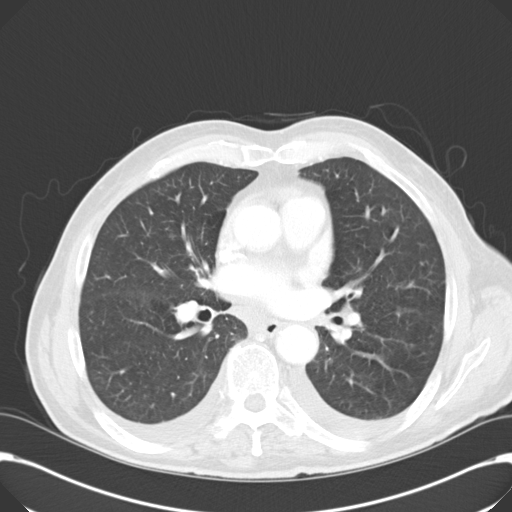
[im 42/68  lung]
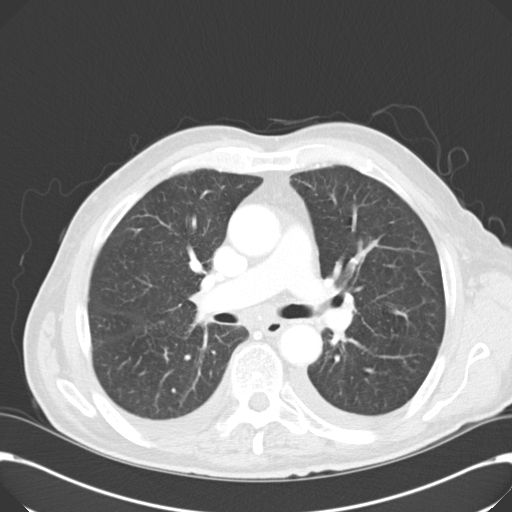
[im 47/68  mediastinal]
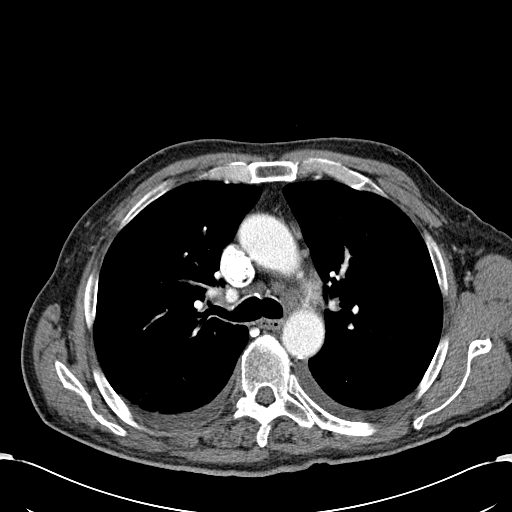
[im 47/68  lung]
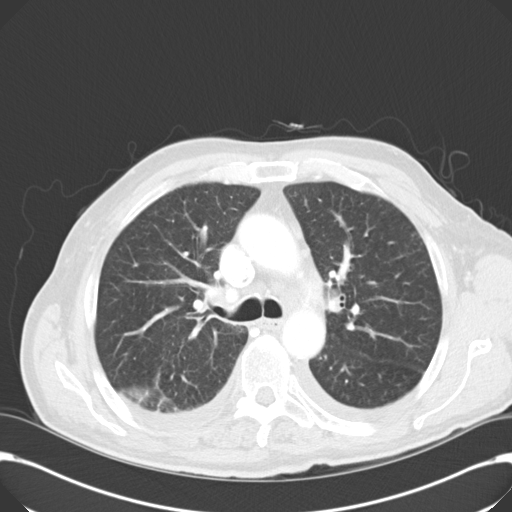
[im 52/68  lung]
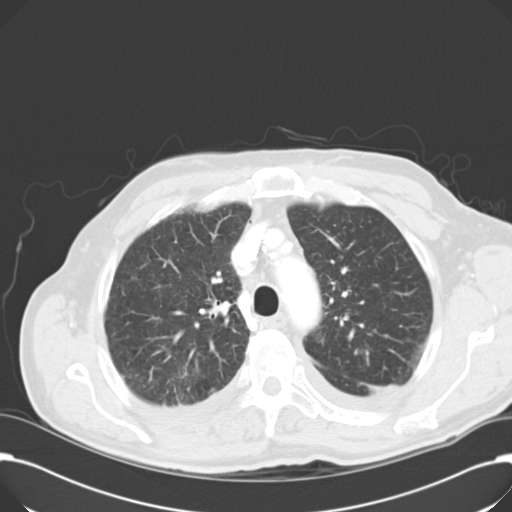
[im 57/68  lung]
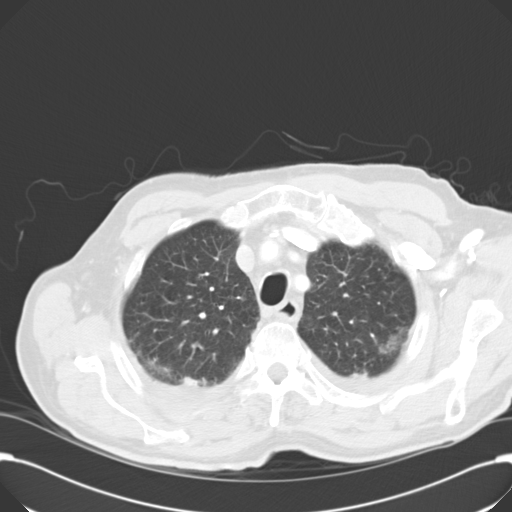
[im 62/68  lung]
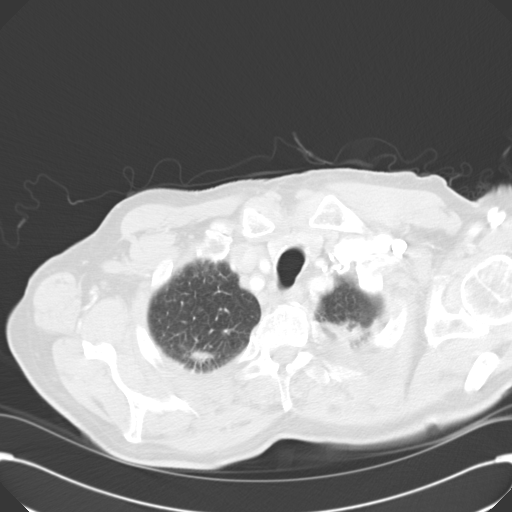

[Series 602: cor · coronal · 0.73mm/px · 3 of 114 slices shown]
[im 23/114  lung]
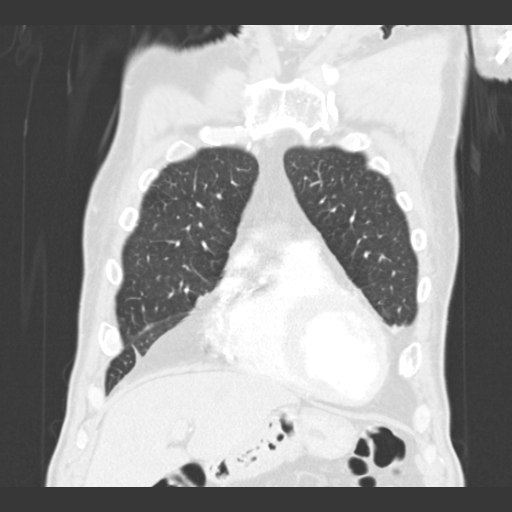
[im 46/114  lung]
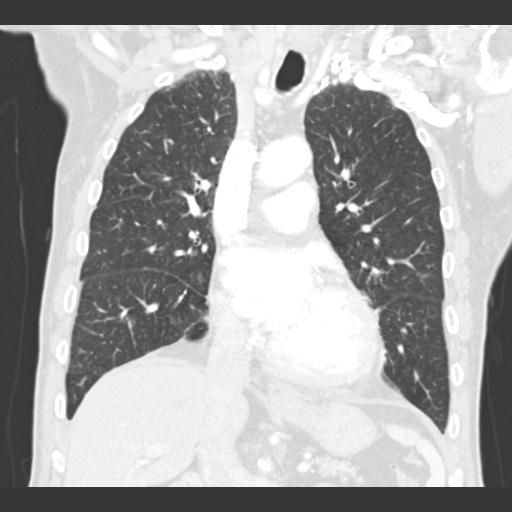
[im 68/114  lung]
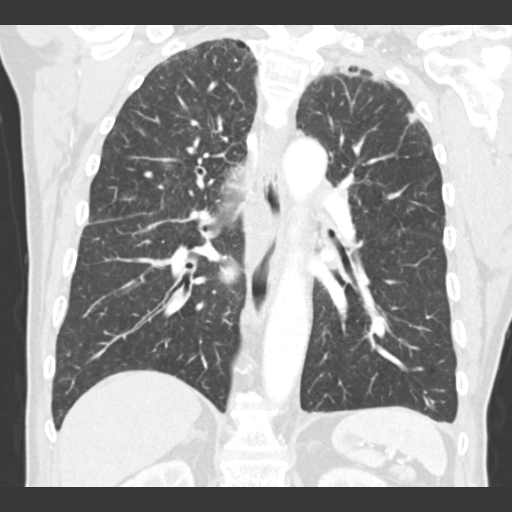

[15 of 36 positions shown; findings below may reference images not displayed]

FINDINGS: The examination demonstrates small bilateral pleural effusions.
Lungs are adequately inflated with subtle patchy nodular
opacification of the superior segment of the right lower lobe. There
is mild peripheral linear opacification over the posterior bilateral
upper lobes with minimal associated nodularity as this is only
slightly more prominent compared to the prior exam. The heart is
normal in size. There subtle calcification of the coronary arteries.
There is a 1.5 cm subcarinal lymph node larger compared to the prior
exam. Remaining mediastinal structures are unremarkable with a few
other smaller sub cm mediastinal and left hilar lymph nodes. No
axillary adenopathy.

Images through the upper abdomen demonstrate calcified plaque over
the abdominal aorta and are otherwise unremarkable. There is mild
spondylosis of the spine.
IMPRESSION: Chronic changes over the posterior upper lobes likely scarring with
minimal nodularity only slightly more prominent compared to 9441.
New small bilateral pleural effusions with patchy nodular
opacification over the superior segment of the right lower lobe
which may be due to infection and less likely an inflammatory or
neoplastic process. 1.5 cm subcarinal lymph node likely reactive.
Recommend followup CT in 6 weeks.

Minimal atherosclerotic coronary artery disease.

## 2015-10-29 ENCOUNTER — Other Ambulatory Visit: Payer: Self-pay | Admitting: *Deleted

## 2015-10-29 MED ORDER — CLONAZEPAM 0.5 MG PO TABS: 0.5000 mg | ORAL_TABLET | Freq: Every evening | ORAL | 0 refills | Status: DC | PRN

## 2015-10-29 NOTE — Telephone Encounter (Signed)
Rx called in to requested pharmacy 

## 2015-10-29 NOTE — Telephone Encounter (Signed)
Last f/u 05/2015 

## 2015-12-13 ENCOUNTER — Ambulatory Visit (INDEPENDENT_AMBULATORY_CARE_PROVIDER_SITE_OTHER): Payer: BLUE CROSS/BLUE SHIELD | Admitting: Cardiovascular Disease

## 2015-12-13 ENCOUNTER — Other Ambulatory Visit: Payer: Self-pay | Admitting: *Deleted

## 2015-12-13 ENCOUNTER — Encounter: Payer: Self-pay | Admitting: Cardiovascular Disease

## 2015-12-13 VITALS — BP 138/80 | HR 72 | Ht 66.0 in | Wt 185.5 lb

## 2015-12-13 DIAGNOSIS — I5022 Chronic systolic (congestive) heart failure: Secondary | ICD-10-CM

## 2015-12-13 DIAGNOSIS — I10 Essential (primary) hypertension: Secondary | ICD-10-CM

## 2015-12-13 DIAGNOSIS — I2510 Atherosclerotic heart disease of native coronary artery without angina pectoris: Secondary | ICD-10-CM | POA: Diagnosis not present

## 2015-12-13 DIAGNOSIS — I340 Nonrheumatic mitral (valve) insufficiency: Secondary | ICD-10-CM | POA: Diagnosis not present

## 2015-12-13 MED ORDER — CLONAZEPAM 0.5 MG PO TABS: 0.5000 mg | ORAL_TABLET | Freq: Every evening | ORAL | 0 refills | Status: DC | PRN

## 2015-12-13 MED ORDER — LOSARTAN POTASSIUM 25 MG PO TABS: 25.0000 mg | ORAL_TABLET | Freq: Every day | ORAL | 5 refills | Status: DC

## 2015-12-13 NOTE — Telephone Encounter (Signed)
Rx called in to requested pharmacy 

## 2015-12-13 NOTE — Progress Notes (Signed)
Cardiology Office Note   Date:  12/13/2015   ID:  Curtis Campos, DOB 05/26/1940, MRN [id number]  PCP:  Arnette Norris, MD  Cardiologist:   Kathlyn Sacramento, MD   Chief Complaint  Patient presents with  . other    6 month f/u no complaints today. Pt concerned about lasix not sure if he has Rx or should be taking medication. Meds reviewed verbally with pt.      History of Present Illness: Curtis Campos is a 75 y.o. male who presents for a followup visit regarding chronic systolic heart failure, coronary artery disease and mitral regurgitation.  He has known history of hypertension, tobacco use and COPD.   He was hospitalized in January of 2015 for congestive heart failure. Echocardiogram in 03/2013 showed an ejection fraction of 25% with inferoposterior akinesis and severe mitral regurgitation with posterior jet.  A right and left cardiac catheterization showed severe one-vessel coronary artery disease with a subtotal occlusion of the mid left circumflex which appeared to be chronic. TEE was performed which showed that the mitral valve was normal in structure and regurgitation was moderate after optimizing his volume status.   Most recent in echocardiogram in April, 2015 showed an ejection fraction of 30-35%, mild aortic stenosis and moderate mitral regurgitation. He was not interested ICD placement. Lisinopril was discontinued by Dr. Deborra Medina in March as he was hypotensive after experiencing bronchitis. He has recovered well from that and has been doing well overall. He denies any chest pain. He has stable exertional dyspnea with no orthopnea, PND or leg edema. He is no longer on small dose furosemide.   Past Medical History:  Diagnosis Date  . Arthritis 03/12/2011  . CHF (congestive heart failure) ([place])   . Chronic obstructive lung disease ([place])   . Chronic systolic heart failure ([place]) 03/2013   EF of 25% with moderate to severe mitral regurgitation  . Coronary artery disease 03/2013     EF of 25%, moderate to severe mitral regurgitation. Catheterization showed normal cardiac output with mild pulmonary hypertension. There was 99% stenosis in the mid left circumflex which appeared to be chronic with akinetic basal inferior/posterior wall. Mild LAD and RCA disease.  Marland Kitchen Hyperlipidemia   . Hypertension   . Mitral regurgitation   . Rectal cancer ([place])   . Renal cancer [facility])     Past Surgical History:  Procedure Laterality Date  . APPENDECTOMY  06/2003  . CARDIAC CATHETERIZATION  03/2013   [facility]  . cataract surgery    . COLOSTOMY    . HERNIA REPAIR    . resection of rectum       Current Outpatient Prescriptions  Medication Sig Dispense Refill  . albuterol (PROVENTIL HFA;VENTOLIN HFA) 108 (90 Base) MCG/ACT inhaler Inhale 2 puffs into the lungs every 6 (six) hours as needed. 1 Inhaler 0  . aspirin 81 MG tablet Take 81 mg by mouth daily. Takes at night     . clonazePAM (KLONOPIN) 0.5 MG tablet Take 1-3 tablets (0.5-1.5 mg total) by mouth at bedtime as needed for anxiety (or sleep). 45 tablet 0  . Magnesium Oxide 400 (240 Mg) MG TABS TAKE 1 TABLET ONCE DAILY 90 tablet 1  . metoprolol succinate (TOPROL-XL) 100 MG 24 hr tablet TAKE 1 TABLET ONCE DAILY WITH FOOD 30 tablet 3  . niacin (NIASPAN) 1000 MG CR tablet TAKE 1 TABLET AT BEDTIME. 30 tablet 5  . spironolactone (ALDACTONE) 25 MG tablet TAKE 1 TABLET ONCE DAILY 30 tablet  6  . traMADol (ULTRAM) 50 MG tablet Take 1 tablet (50 mg total) by mouth every 8 (eight) hours as needed. 30 tablet 0  . ZETIA 10 MG tablet TAKE 1 TABLET ONCE DAILY 30 tablet 5   No current facility-administered medications for this visit.     Allergies:   Codeine and Statins    Social History:  The patient  reports that he quit smoking about 2 years ago. His smoking use included Cigarettes. He has a 50.00 pack-year smoking history. He has never used smokeless tobacco. He reports that he does not drink alcohol or use drugs.   Family History:  The  patient's family history includes Heart attack (age of onset: 24) in his brother; Heart disease in his father, mother, sister, and sister; Hyperlipidemia in his father; Hypertension in his father.    ROS:  Please see the history of present illness.   Otherwise, review of systems are positive for none.   All other systems are reviewed and negative.    PHYSICAL EXAM: VS:  BP 138/80 (BP Location: Left Arm, Patient Position: Sitting, Cuff Size: Normal)   Pulse 72   Ht '5\' 6"'$  (1.676 m)   Wt 185 lb 8 oz (84.1 kg)   BMI 29.94 kg/m  , BMI Body mass index is 29.94 kg/m. GEN: Well nourished, well developed, in no acute distress  HEENT: normal  Neck: no JVD, carotid bruits, or masses Cardiac: RRR with premature beats; rubs, or gallops,no edema . 2 out of 6 mid peaking crescendo decrescendo murmur in the aortic area. 2/6 holosystolic murmur at the apex. Respiratory:  clear to auscultation bilaterally, normal work of breathing GI: soft, nontender, nondistended, + BS MS: no deformity or atrophy  Skin: warm and dry, no rash Neuro:  Strength and sensation are intact Psych: euthymic mood, full affect   EKG:  EKG is ordered today. The ekg ordered today demonstrates sinus rhythm with sinus arrhythmia. No significant ST or T wave changes.   Recent Labs: 03/14/2015: ALT 14; BUN 17.6; Creatinine 0.9; HGB 12.3; Platelets 279; Potassium 4.9; Sodium 131    Lipid Panel    Component Value Date/Time   CHOL 192 05/01/2015 1303   TRIG 108.0 05/01/2015 1303   HDL 72.50 05/01/2015 1303   CHOLHDL 3 05/01/2015 1303   VLDL 21.6 05/01/2015 1303   LDLCALC 110 (H) 05/01/2015 1303   LDLDIRECT 129.7 04/14/2012 0756      Wt Readings from Last 3 Encounters:  12/13/15 185 lb 8 oz (84.1 kg)  06/10/15 174 lb 12 oz (79.3 kg)  06/04/15 179 lb (81.2 kg)         ASSESSMENT AND PLAN:  1. Chronic systolic heart failure: The patient fluctuates between class II and class III New [employer] Heart Association symptoms.  He appears to be euvolemic without furosemide. Lisinopril was discontinued in March due to hypotension but that has resolved. I elected to add losartan 25 mg once daily. Continue treatment with Toprol. Continue spironolactone. I'm going to repeat his echocardiogram. If ejection fraction is below 40%, I plan on switching losartan to Entresto.  3. Coronary artery disease: Currently with no anginal symptoms. Continue medical therapy.  4. Essential hypertension: Blood pressure is reasonably controlled on current medications. Losartan was added  5. Hyperlipidemia: He is intolerant to statins and currently is on Zetia.    6. History of mild aortic stenosis and moderate mitral regurgitation. No evaluation since 2015. I requested a repeat echocardiogram.   Disposition:   FU  with me in 6 months  Signed,  Kathlyn Sacramento, MD  12/13/2015 11:39 AM    [place]

## 2015-12-13 NOTE — Telephone Encounter (Signed)
Last f/u 05/2015 

## 2015-12-13 NOTE — Patient Instructions (Addendum)
Medication Instructions:  Your physician has recommended you make the following change in your medication:  START taking losartan '25mg'$  once daily   Labwork: none  Testing/Procedures: Your physician has requested that you have an echocardiogram. Echocardiography is a painless test that uses sound waves to create images of your heart. It provides your doctor with information about the size and shape of your heart and how well your heart's chambers and valves are working. This procedure takes approximately one hour. There are no restrictions for this procedure.    Follow-Up: Your physician wants you to follow-up in: 6 months with Dr. Fletcher Anon.  You will receive a reminder letter in the mail two months in advance. If you don't receive a letter, please call our office to schedule the follow-up appointment.   Any Other Special Instructions Will Be Listed Below (If Applicable).     If you need a refill on your cardiac medications before your next appointment, please call your pharmacy.  Echocardiogram An echocardiogram, or echocardiography, uses sound waves (ultrasound) to produce an image of your heart. The echocardiogram is simple, painless, obtained within a short period of time, and offers valuable information to your health care provider. The images from an echocardiogram can provide information such as:  Evidence of coronary artery disease (CAD).  Heart size.  Heart muscle function.  Heart valve function.  Aneurysm detection.  Evidence of a past heart attack.  Fluid buildup around the heart.  Heart muscle thickening.  Assess heart valve function. LET [facility] CARE PROVIDER KNOW ABOUT:  Any allergies you have.  All medicines you are taking, including vitamins, herbs, eye drops, creams, and over-the-counter medicines.  Previous problems you or members of your family have had with the use of anesthetics.  Any blood disorders you have.  Previous surgeries you have  had.  Medical conditions you have.  Possibility of pregnancy, if this applies. BEFORE THE PROCEDURE  No special preparation is needed. Eat and drink normally.  PROCEDURE   In order to produce an image of your heart, gel will be applied to your chest and a wand-like tool (transducer) will be moved over your chest. The gel will help transmit the sound waves from the transducer. The sound waves will harmlessly bounce off your heart to allow the heart images to be captured in real-time motion. These images will then be recorded.  You may need an IV to receive a medicine that improves the quality of the pictures. AFTER THE PROCEDURE You may return to your normal schedule including diet, activities, and medicines, unless your health care provider tells you otherwise.   This information is not intended to replace advice given to you by your health care provider. Make sure you discuss any questions you have with your health care provider.   Document Released: 03/13/2000 Document Revised: 04/06/2014 Document Reviewed: 11/21/2012 Elsevier Interactive Patient Education [employer].

## 2015-12-16 ENCOUNTER — Other Ambulatory Visit: Payer: Self-pay | Admitting: Family Medicine

## 2015-12-16 NOTE — Telephone Encounter (Signed)
Last labs abnormal. pls advise

## 2016-01-01 ENCOUNTER — Other Ambulatory Visit: Payer: Self-pay

## 2016-01-01 ENCOUNTER — Ambulatory Visit (INDEPENDENT_AMBULATORY_CARE_PROVIDER_SITE_OTHER): Payer: BLUE CROSS/BLUE SHIELD

## 2016-01-01 DIAGNOSIS — I340 Nonrheumatic mitral (valve) insufficiency: Secondary | ICD-10-CM

## 2016-01-16 ENCOUNTER — Other Ambulatory Visit: Payer: Self-pay | Admitting: Family Medicine

## 2016-01-23 ENCOUNTER — Telehealth: Payer: Self-pay | Admitting: Cardiovascular Disease

## 2016-01-23 ENCOUNTER — Other Ambulatory Visit: Payer: Self-pay

## 2016-01-23 ENCOUNTER — Other Ambulatory Visit: Payer: Self-pay | Admitting: Cardiovascular Disease

## 2016-01-23 MED ORDER — MAGNESIUM OXIDE -MG SUPPLEMENT 400 (240 MG) MG PO TABS: 1.0000 | ORAL_TABLET | Freq: Every day | ORAL | 1 refills | Status: DC

## 2016-01-23 MED ORDER — SPIRONOLACTONE 25 MG PO TABS: 25.0000 mg | ORAL_TABLET | Freq: Every day | ORAL | 1 refills | Status: DC

## 2016-01-23 NOTE — Telephone Encounter (Signed)
*  STAT* If patient is at the pharmacy, call can be transferred to refill team.   1. Which medications need to be refilled? (please list name of each medication and dose if known)spironolactone (ALDACTONE) 25 MG tablet and Magnesium Oxide 400 MG TABS  2. Which pharmacy/location (including street and city if local pharmacy) is medication to be sent to? [place] t [phone] f [phone]  3. Do they need a 30 day or 90 day supply? 90 day

## 2016-01-23 NOTE — Telephone Encounter (Signed)
Refills submitted.  Left message on pt's home phone

## 2016-01-28 ENCOUNTER — Other Ambulatory Visit: Payer: Self-pay | Admitting: *Deleted

## 2016-01-28 MED ORDER — CLONAZEPAM 0.5 MG PO TABS: 0.5000 mg | ORAL_TABLET | Freq: Every evening | ORAL | 0 refills | Status: DC | PRN

## 2016-01-28 NOTE — Telephone Encounter (Signed)
Patient's wife left a voicemail requesting a refill on Clonazepam  Last refill 12/13/15 #45 Last office visit 06/10/15

## 2016-01-29 NOTE — Telephone Encounter (Signed)
pts wife left v/m requesting status of clonazepam. Pt is out of med.

## 2016-01-29 NOTE — Telephone Encounter (Signed)
Lavella Lemons at [place] left v/m requesting cb about status of refill for clonazepam.Please advise.

## 2016-01-29 NOTE — Telephone Encounter (Signed)
Rx called in to requested pharmacy 

## 2016-02-03 ENCOUNTER — Other Ambulatory Visit: Payer: Self-pay | Admitting: Cardiovascular Disease

## 2016-02-29 DIAGNOSIS — Z23 Encounter for immunization: Secondary | ICD-10-CM | POA: Diagnosis not present

## 2016-03-02 ENCOUNTER — Other Ambulatory Visit: Payer: Self-pay | Admitting: Cardiovascular Disease

## 2016-03-06 ENCOUNTER — Other Ambulatory Visit: Payer: Self-pay | Admitting: Family Medicine

## 2016-03-10 ENCOUNTER — Other Ambulatory Visit: Payer: Self-pay | Admitting: *Deleted

## 2016-03-10 MED ORDER — CLONAZEPAM 0.5 MG PO TABS: 0.5000 mg | ORAL_TABLET | Freq: Every evening | ORAL | 0 refills | Status: DC | PRN

## 2016-03-10 NOTE — Telephone Encounter (Signed)
Last f/u 05/2015 

## 2016-03-11 NOTE — Telephone Encounter (Signed)
Rx called in to requested pharmacy 

## 2016-03-12 DIAGNOSIS — H524 Presbyopia: Secondary | ICD-10-CM | POA: Diagnosis not present

## 2016-03-12 DIAGNOSIS — H35023 Exudative retinopathy, bilateral: Secondary | ICD-10-CM | POA: Diagnosis not present

## 2016-03-12 NOTE — Assessment & Plan Note (Signed)
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  Surveillance: Annual CEAs had been normal. CEA done today is pending. CBC CMP without any major abnormalities. patient refuses colonoscopy screening because he does not want anyone to go through the ostomy since it has been working so well and he does not want anyone to mess it up.  Survivorship: I discussed with him the importance of exercise and eating more fruits and vegetables and decreasing the risk of recurrence of colon cancer.  Return to clinic as needed

## 2016-03-13 ENCOUNTER — Ambulatory Visit (HOSPITAL_BASED_OUTPATIENT_CLINIC_OR_DEPARTMENT_OTHER): Payer: BLUE CROSS/BLUE SHIELD | Admitting: Hematology and Oncology

## 2016-03-13 ENCOUNTER — Encounter: Payer: Self-pay | Admitting: Hematology and Oncology

## 2016-03-13 ENCOUNTER — Other Ambulatory Visit (HOSPITAL_BASED_OUTPATIENT_CLINIC_OR_DEPARTMENT_OTHER): Payer: BLUE CROSS/BLUE SHIELD

## 2016-03-13 DIAGNOSIS — C20 Malignant neoplasm of rectum: Secondary | ICD-10-CM

## 2016-03-13 DIAGNOSIS — Z85048 Personal history of other malignant neoplasm of rectum, rectosigmoid junction, and anus: Secondary | ICD-10-CM

## 2016-03-13 LAB — COMPREHENSIVE METABOLIC PANEL
ALT: 13 U/L (ref 0–55)
AST: 17 U/L (ref 5–34)
Albumin: 3.8 g/dL (ref 3.5–5.0)
Alkaline Phosphatase: 67 U/L (ref 40–150)
Anion Gap: 7 mEq/L (ref 3–11)
BILIRUBIN TOTAL: 0.82 mg/dL (ref 0.20–1.20)
BUN: 20.1 mg/dL (ref 7.0–26.0)
CHLORIDE: 100 meq/L (ref 98–109)
CO2: 25 meq/L (ref 22–29)
CREATININE: 1 mg/dL (ref 0.7–1.3)
Calcium: 9.5 mg/dL (ref 8.4–10.4)
EGFR: 77 mL/min/{1.73_m2} — ABNORMAL LOW (ref >90)
GLUCOSE: 110 mg/dL (ref 70–140)
Potassium: 5 mEq/L (ref 3.5–5.1)
Sodium: 132 mEq/L — ABNORMAL LOW (ref 136–145)
TOTAL PROTEIN: 7.2 g/dL (ref 6.4–8.3)

## 2016-03-13 LAB — CBC WITH DIFFERENTIAL/PLATELET
BASO%: 0.6 % (ref 0.0–2.0)
Basophils Absolute: 0.1 10*3/uL (ref 0.0–0.1)
EOS%: 4.3 % (ref 0.0–7.0)
Eosinophils Absolute: 0.4 10*3/uL (ref 0.0–0.5)
HCT: 38.9 % (ref 38.4–49.9)
HGB: 13 g/dL (ref 13.0–17.1)
LYMPH#: 1.6 10*3/uL (ref 0.9–3.3)
LYMPH%: 18.3 % (ref 14.0–49.0)
MCH: 30.6 pg (ref 27.2–33.4)
MCHC: 33.4 g/dL (ref 32.0–36.0)
MCV: 91.5 fL (ref 79.3–98.0)
MONO#: 0.9 10*3/uL (ref 0.1–0.9)
MONO%: 10.9 % (ref 0.0–14.0)
NEUT%: 65.9 % (ref 39.0–75.0)
NEUTROS ABS: 5.7 10*3/uL (ref 1.5–6.5)
PLATELETS: 271 10*3/uL (ref 140–400)
RBC: 4.25 10*6/uL (ref 4.20–5.82)
RDW: 13.2 % (ref 11.0–14.6)
WBC: 8.6 10*3/uL (ref 4.0–10.3)

## 2016-03-13 LAB — CEA (IN HOUSE-CHCC): CEA (CHCC-In House): 1.77 ng/mL (ref 0.00–5.00)

## 2016-03-13 NOTE — Progress Notes (Signed)
Patient Care Team: Lucille Passy, MD as PCP - General  DIAGNOSIS:  Encounter Diagnosis  Name Primary?  . Malignant neoplasm of rectum (HCC)    CHIEF COMPLIANT: Follow-up of rectal cancer  INTERVAL HISTORY: Curtis Campos is a 75 year old with a history of rectal cancer in 2004 who is currently on surveillance. He does not have any pain or discomfort. He is still working at [employer]. He stays fairly active. He denies any abdominal pain nausea vomiting. Denies any constipation diarrhea or hematemesis or melena. His weight has been extremely stable.  REVIEW OF SYSTEMS:   Constitutional: Denies fevers, chills or abnormal weight loss Eyes: Denies blurriness of vision Ears, nose, mouth, throat, and face: Denies mucositis or sore throat Respiratory: Denies cough, dyspnea or wheezes Cardiovascular: Denies palpitation, chest discomfort Gastrointestinal:  Denies nausea, heartburn or change in bowel habits Skin: Denies abnormal skin rashes Lymphatics: Denies new lymphadenopathy or easy bruising Neurological:Denies numbness, tingling or new weaknesses Behavioral/Psych: Mood is stable, no new changes  Extremities: No lower extremity edema All other systems were reviewed with the patient and are negative.  I have reviewed the past medical history, past surgical history, social history and family history with the patient and they are unchanged from previous note.  ALLERGIES:  is allergic to codeine and statins.  MEDICATIONS:  Current Outpatient Prescriptions  Medication Sig Dispense Refill  . albuterol (PROVENTIL HFA;VENTOLIN HFA) 108 (90 Base) MCG/ACT inhaler Inhale 2 puffs into the lungs every 6 (six) hours as needed. 1 Inhaler 0  . aspirin 81 MG tablet Take 81 mg by mouth daily. Takes at night     . clonazePAM (KLONOPIN) 0.5 MG tablet Take 1-3 tablets (0.5-1.5 mg total) by mouth at bedtime as needed for anxiety (or sleep). 45 tablet 0  . ezetimibe (ZETIA) 10 MG tablet  TAKE 1 TABLET BY MOUTH ONCE DAILY. 30 tablet 0  . losartan (COZAAR) 25 MG tablet Take 1 tablet (25 mg total) by mouth daily. 30 tablet 5  . Magnesium Oxide 400 (240 Mg) MG TABS Take 1 tablet by mouth daily. 90 tablet 1  . metoprolol succinate (TOPROL-XL) 100 MG 24 hr tablet TAKE (1) TABLET BY MOUTH DAILY WITH FOOD 30 tablet 3  . niacin (NIASPAN) 1000 MG CR tablet TAKE ONE TABLET BY MOUTH AT BEDTIME. 30 tablet 0  . spironolactone (ALDACTONE) 25 MG tablet TAKE 1 TABLET BY MOUTH ONCE DAILY. 30 tablet 3  . traMADol (ULTRAM) 50 MG tablet Take 1 tablet (50 mg total) by mouth every 8 (eight) hours as needed. 30 tablet 0   No current facility-administered medications for this visit.     PHYSICAL EXAMINATION: ECOG PERFORMANCE STATUS: 1 - Symptomatic but completely ambulatory  Vitals:   03/13/16 1124  BP: 133/82  Pulse: 73  Resp: 19  Temp: 98.1 F (36.7 C)   Filed Weights   03/13/16 1124  Weight: 188 lb 1.6 oz (85.3 kg)    GENERAL:alert, no distress and comfortable SKIN: skin color, texture, turgor are normal, no rashes or significant lesions EYES: normal, Conjunctiva are pink and non-injected, sclera clear OROPHARYNX:no exudate, no erythema and lips, buccal mucosa, and tongue normal  NECK: supple, thyroid normal size, non-tender, without nodularity LYMPH:  no palpable lymphadenopathy in the cervical, axillary or inguinal LUNGS: clear to auscultation and percussion with normal breathing effort HEART: regular rate & rhythm and no murmurs and no lower extremity edema ABDOMEN:abdomen soft, non-tender and normal bowel sounds MUSCULOSKELETAL:no cyanosis of digits and no clubbing  NEURO: alert & oriented x 3 with fluent speech, no focal motor/sensory deficits EXTREMITIES: No lower extremity edema BREAST: No palpable masses or nodules in either right or left breasts. No palpable axillary supraclavicular or infraclavicular adenopathy no breast tenderness or nipple discharge. (exam performed in  the presence of a chaperone)  LABORATORY DATA:  I have reviewed the data as listed   Chemistry      Component Value Date/Time   NA 131 (L) 03/14/2015 1107   K 4.9 03/14/2015 1107   CL 94 (L) 11/20/2014 1430   CL 94 (L) 04/13/2013 0421   CL 101 03/11/2012 1404   CO2 24 03/14/2015 1107   BUN 17.6 03/14/2015 1107   CREATININE 0.9 03/14/2015 1107      Component Value Date/Time   CALCIUM 9.6 03/14/2015 1107   ALKPHOS 61 03/14/2015 1107   AST 19 03/14/2015 1107   ALT 14 03/14/2015 1107   BILITOT 0.86 03/14/2015 1107       Lab Results  Component Value Date   WBC 8.6 03/13/2016   HGB 13.0 03/13/2016   HCT 38.9 03/13/2016   MCV 91.5 03/13/2016   PLT 271 03/13/2016   NEUTROABS 5.7 03/13/2016    ASSESSMENT & PLAN:  Malignant neoplasm of rectum Adenocarcinoma the rectum diagnosed December 2004 presented as 3.5 cm mass 5 cm from the anal verge status post neoadjuvant chemoradiation with IV 5-FU followed by APR with colostomy.  Surveillance: Annual CEAs had been normal. CEA done today is pending. CBC CMP without any major abnormalities. patient refuses colonoscopy screening because he does not want anyone to go through the ostomy since it has been working so well and he does not want anyone to mess it up.  Survivorship: I discussed with him the importance of exercise and eating more fruits and vegetables and decreasing the risk of recurrence of colon cancer.  I offered the patient that he can be seen on an as-needed basis. However he would like to be seen once a year with [place]. I will set him up for a one-year follow-up   No orders of the defined types were placed in this encounter.  The patient has a good understanding of the overall plan. he agrees with it. he will call with any problems that may develop before the next visit here.   Rulon Eisenmenger, MD 03/13/16

## 2016-03-14 LAB — CEA (PARALLEL TESTING): CEA: 0.9 ng/mL

## 2016-03-27 DIAGNOSIS — J0110 Acute frontal sinusitis, unspecified: Secondary | ICD-10-CM | POA: Diagnosis not present

## 2016-03-27 DIAGNOSIS — H1033 Unspecified acute conjunctivitis, bilateral: Secondary | ICD-10-CM | POA: Diagnosis not present

## 2016-04-06 ENCOUNTER — Other Ambulatory Visit: Payer: Self-pay | Admitting: Family Medicine

## 2016-04-24 ENCOUNTER — Other Ambulatory Visit: Payer: Self-pay

## 2016-04-24 MED ORDER — CLONAZEPAM 0.5 MG PO TABS: 0.5000 mg | ORAL_TABLET | Freq: Every evening | ORAL | 0 refills | Status: DC | PRN

## 2016-04-24 NOTE — Telephone Encounter (Signed)
Curtis Campos left v/m requesting refill clonazepam to [facility]. Last refilled# 45 on 03/10/16; pt last seen 06/10/15. No future appt scheduled.

## 2016-04-27 NOTE — Telephone Encounter (Signed)
Rx called in to requested pharmacy 

## 2016-04-27 NOTE — Telephone Encounter (Signed)
Catherine left v/m wanting to ck on clonazepam refill; spoke with Anda Kraft at [facility] and rx should be ready in 30 mins. Barnetta Chapel notified ready and will pick up later today.

## 2016-05-04 ENCOUNTER — Other Ambulatory Visit: Payer: Self-pay | Admitting: Family Medicine

## 2016-05-04 ENCOUNTER — Other Ambulatory Visit: Payer: Self-pay | Admitting: Cardiovascular Disease

## 2016-05-11 ENCOUNTER — Other Ambulatory Visit: Payer: Self-pay | Admitting: Cardiovascular Disease

## 2016-05-11 ENCOUNTER — Telehealth: Payer: Self-pay

## 2016-05-11 NOTE — Telephone Encounter (Signed)
pts wife left vm; (DPR signed)pt has appt on 05/12/16 at 4:15 and pt has had a lot of cough and congestion for a while and pts wife does not think pt will tell Curtis Echevaria NP. Curtis Campos pts wife would like pt to have a CXR and to be checked thoroughly;pt has had pneumonia in the past. Curtis Campos would like pt to have an inhaler. pts cough is prod with color to phlegm. Curtis Campos does not think pt has SOB or wheezing.  No fever to her knowledge. Pt is coming from work to see Curtis Echevaria NP on 05/12/16 so pts wife will not be with him.

## 2016-05-12 ENCOUNTER — Encounter: Payer: Self-pay | Admitting: Internal Medicine

## 2016-05-12 ENCOUNTER — Ambulatory Visit (INDEPENDENT_AMBULATORY_CARE_PROVIDER_SITE_OTHER): Payer: BLUE CROSS/BLUE SHIELD | Admitting: Internal Medicine

## 2016-05-12 VITALS — BP 122/66 | HR 70 | Temp 97.6°F | Wt 190.0 lb

## 2016-05-12 DIAGNOSIS — J069 Acute upper respiratory infection, unspecified: Secondary | ICD-10-CM | POA: Diagnosis not present

## 2016-05-12 MED ORDER — AZITHROMYCIN 250 MG PO TABS: ORAL_TABLET | ORAL | 0 refills | Status: DC

## 2016-05-12 MED ORDER — ALBUTEROL SULFATE HFA 108 (90 BASE) MCG/ACT IN AERS
2.0000 | INHALATION_SPRAY | Freq: Four times a day (QID) | RESPIRATORY_TRACT | 0 refills | Status: DC | PRN
Start: 2016-05-12 — End: 2016-11-05

## 2016-05-12 MED ORDER — BENZONATATE 200 MG PO CAPS: 200.0000 mg | ORAL_CAPSULE | Freq: Two times a day (BID) | ORAL | 0 refills | Status: DC | PRN

## 2016-05-12 NOTE — Telephone Encounter (Signed)
noted 

## 2016-05-12 NOTE — Progress Notes (Signed)
HPI  Pt presents to the clinic today with c/o runny nose, cough and chest congestion. This started 3 weeks ago. He is blowing clear mucous out of his nose. He is coughing up green mucous, worse in the morning. He denies ear pain, sore throat, fever, chills or body aches. He has tried Human resources officer with minimal relief. He has a history of COPD and CHF but denies shortness of breath. He has had sick contacts.  Review of Systems        Past Medical History:  Diagnosis Date  . Arthritis 03/12/2011  . CHF (congestive heart failure) ([place])   . Chronic obstructive lung disease ([place])   . Chronic systolic heart failure ([place]) 03/2013   EF of 25% with moderate to severe mitral regurgitation  . Coronary artery disease 03/2013   EF of 25%, moderate to severe mitral regurgitation. Catheterization showed normal cardiac output with mild pulmonary hypertension. There was 99% stenosis in the mid left circumflex which appeared to be chronic with akinetic basal inferior/posterior wall. Mild LAD and RCA disease.  Marland Kitchen Hyperlipidemia   . Hypertension   . Mitral regurgitation   . Rectal cancer ([place])   . Renal cancer ([place])     Family History  Problem Relation Age of Onset  . Heart disease Mother   . Heart disease Father   . Hyperlipidemia Father   . Hypertension Father   . Heart attack Brother 4    MI  . Heart disease Sister     stents placed   . Heart disease Sister     CABG    Social History   Social History  . Marital status: Married    Spouse name: N/A  . Number of children: N/A  . Years of education: N/A   Occupational History  . Not on file.   Social History Main Topics  . Smoking status: Former Smoker    Packs/day: 1.00    Years: 50.00    Types: Cigarettes    Quit date: 03/25/2013  . Smokeless tobacco: Never Used  . Alcohol use No  . Drug use: No  . Sexual activity: Yes   Other Topics Concern  . Not on file   Social History Narrative  . No narrative on file    Allergies   Allergen Reactions  . Codeine Nausea Only  . Statins Other (See Comments)    REACTION: maylgias     Constitutional: Denies headache, fatigue, fever or abrupt weight changes.  HEENT:  Positive runny nose. Denies eye redness, eye pain, pressure behind the eyes, facial pain, nasal congestion, ear pain, ringing in the ears, wax buildup or sore throat. Respiratory: Positive cough. Denies difficulty breathing or shortness of breath.  Cardiovascular: Denies chest pain, chest tightness, palpitations or swelling in the hands or feet.   No other specific complaints in a complete review of systems (except as listed in HPI above).  Objective:   BP 122/66   Pulse 70   Temp 97.6 F (36.4 C) (Oral)   Wt 190 lb (86.2 kg)   SpO2 99%   BMI 30.67 kg/m   Wt Readings from Last 3 Encounters:  05/12/16 190 lb (86.2 kg)  03/13/16 188 lb 1.6 oz (85.3 kg)  12/13/15 185 lb 8 oz (84.1 kg)     General: Appears his stated age, in NAD. HEENT: Head: normal shape and size;  Ears: Tm's gray and intact, normal light reflex; Nose: mucosa pink and moist, septum midline; Throat/Mouth: + PND. Teeth present, mucosa  pink and moist, no exudate noted, no lesions or ulcerations noted.  Neck: No cervical lymphadenopathy.  Cardiovascular: Normal rate and rhythm. Murmur noted. Pulmonary/Chest: Normal effort and positive vesicular breath sounds, diminished in the RLL. No respiratory distress. No wheezes, rales or ronchi noted.       Assessment & Plan:   Upper Respiratory Infection with Cough:  Get some rest and drink plenty of water Continue Allegra eRx for Azithromax x 5 days Albuterol refilled today eRx for Tessalon Pearls  RTC as needed or if symptoms persist.   Webb Silversmith, NP

## 2016-05-12 NOTE — Patient Instructions (Signed)
Cough, Adult Introduction A cough helps to clear your throat and lungs. A cough may last only 2-3 weeks (acute), or it may last longer than 8 weeks (chronic). Many different things can cause a cough. A cough may be a sign of an illness or another medical condition. Follow these instructions at home:  Pay attention to any changes in your cough.  Take medicines only as told by your doctor.  If you were prescribed an antibiotic medicine, take it as told by your doctor. Do not stop taking it even if you start to feel better.  Talk with your doctor before you try using a cough medicine.  Drink enough fluid to keep your pee (urine) clear or pale yellow.  If the air is dry, use a cold steam vaporizer or humidifier in your home.  Stay away from things that make you cough at work or at home.  If your cough is worse at night, try using extra pillows to raise your head up higher while you sleep.  Do not smoke, and try not to be around smoke. If you need help quitting, ask your doctor.  Do not have caffeine.  Do not drink alcohol.  Rest as needed. Contact a doctor if:  You have new problems (symptoms).  You cough up yellow fluid (pus).  Your cough does not get better after 2-3 weeks, or your cough gets worse.  Medicine does not help your cough and you are not sleeping well.  You have pain that gets worse or pain that is not helped with medicine.  You have a fever.  You are losing weight and you do not know why.  You have night sweats. Get help right away if:  You cough up blood.  You have trouble breathing.  Your heartbeat is very fast. This information is not intended to replace advice given to you by your health care provider. Make sure you discuss any questions you have with your health care provider. Document Released: 11/27/2010 Document Revised: 08/22/2015 Document Reviewed: 05/23/2014  2017 Elsevier

## 2016-06-03 ENCOUNTER — Other Ambulatory Visit: Payer: Self-pay | Admitting: Cardiovascular Disease

## 2016-06-03 NOTE — Telephone Encounter (Signed)
Pt needs f/u appt with Arida. Thank you. 

## 2016-06-04 ENCOUNTER — Telehealth: Payer: Self-pay | Admitting: Cardiovascular Disease

## 2016-06-04 NOTE — Telephone Encounter (Signed)
L mom to schedule f/u appt with dr Fletcher Anon.

## 2016-06-06 ENCOUNTER — Other Ambulatory Visit: Payer: Self-pay | Admitting: Family Medicine

## 2016-06-06 DIAGNOSIS — Z0000 Encounter for general adult medical examination without abnormal findings: Secondary | ICD-10-CM | POA: Insufficient documentation

## 2016-06-06 DIAGNOSIS — E785 Hyperlipidemia, unspecified: Secondary | ICD-10-CM

## 2016-06-08 ENCOUNTER — Other Ambulatory Visit: Payer: Self-pay

## 2016-06-08 MED ORDER — CLONAZEPAM 0.5 MG PO TABS: 0.5000 mg | ORAL_TABLET | Freq: Every evening | ORAL | 0 refills | Status: DC | PRN

## 2016-06-08 NOTE — Telephone Encounter (Signed)
Last filled 04-27-16 Last OV Acute 05-12-16 Next OV CPE 06-17-16

## 2016-06-08 NOTE — Telephone Encounter (Signed)
Left refill on voice mail at pharmacy  

## 2016-06-12 ENCOUNTER — Encounter (INDEPENDENT_AMBULATORY_CARE_PROVIDER_SITE_OTHER): Payer: Self-pay

## 2016-06-12 ENCOUNTER — Other Ambulatory Visit (INDEPENDENT_AMBULATORY_CARE_PROVIDER_SITE_OTHER): Payer: Medicare Other

## 2016-06-12 ENCOUNTER — Ambulatory Visit: Payer: BLUE CROSS/BLUE SHIELD

## 2016-06-12 DIAGNOSIS — Z0000 Encounter for general adult medical examination without abnormal findings: Secondary | ICD-10-CM | POA: Diagnosis not present

## 2016-06-12 LAB — COMPREHENSIVE METABOLIC PANEL
ALBUMIN: 4.2 g/dL (ref 3.5–5.2)
ALK PHOS: 63 U/L (ref 39–117)
ALT: 12 U/L (ref 0–53)
AST: 16 U/L (ref 0–37)
BILIRUBIN TOTAL: 0.9 mg/dL (ref 0.2–1.2)
BUN: 18 mg/dL (ref 6–23)
CHLORIDE: 93 meq/L — AB (ref 96–112)
CO2: 28 meq/L (ref 19–32)
Calcium: 9.7 mg/dL (ref 8.4–10.5)
Creatinine, Ser: 0.9 mg/dL (ref 0.40–1.50)
GFR: 87.28 mL/min (ref >60.00)
GLUCOSE: 109 mg/dL — AB (ref 70–99)
POTASSIUM: 4.9 meq/L (ref 3.5–5.1)
SODIUM: 126 meq/L — AB (ref 135–145)
Total Protein: 6.8 g/dL (ref 6.0–8.3)

## 2016-06-12 LAB — CBC WITH DIFFERENTIAL/PLATELET
BASOS PCT: 0.8 % (ref 0.0–3.0)
Basophils Absolute: 0.1 10*3/uL (ref 0.0–0.1)
EOS PCT: 6.1 % — AB (ref 0.0–5.0)
Eosinophils Absolute: 0.6 10*3/uL (ref 0.0–0.7)
HCT: 38.8 % — ABNORMAL LOW (ref 39.0–52.0)
HEMOGLOBIN: 12.9 g/dL — AB (ref 13.0–17.0)
LYMPHS ABS: 1.6 10*3/uL (ref 0.7–4.0)
Lymphocytes Relative: 18 % (ref 12.0–46.0)
MCHC: 33.2 g/dL (ref 30.0–36.0)
MCV: 92.7 fl (ref 78.0–100.0)
MONO ABS: 1 10*3/uL (ref 0.1–1.0)
Monocytes Relative: 11 % (ref 3.0–12.0)
NEUTROS PCT: 64.1 % (ref 43.0–77.0)
Neutro Abs: 5.7 10*3/uL (ref 1.4–7.7)
Platelets: 297 10*3/uL (ref 150.0–400.0)
RBC: 4.19 Mil/uL — ABNORMAL LOW (ref 4.22–5.81)
RDW: 13.7 % (ref 11.5–15.5)
WBC: 9 10*3/uL (ref 4.0–10.5)

## 2016-06-12 LAB — LIPID PANEL
CHOL/HDL RATIO: 3
Cholesterol: 211 mg/dL — ABNORMAL HIGH (ref 0–200)
HDL: 68.2 mg/dL (ref >39.00)
LDL Cholesterol: 118 mg/dL — ABNORMAL HIGH (ref 0–99)
NONHDL: 143.24
Triglycerides: 128 mg/dL (ref 0.0–149.0)
VLDL: 25.6 mg/dL (ref 0.0–40.0)

## 2016-06-12 LAB — PSA: PSA: 0.82 ng/mL (ref 0.10–4.00)

## 2016-06-17 ENCOUNTER — Encounter: Payer: BLUE CROSS/BLUE SHIELD | Admitting: Family Medicine

## 2016-06-23 ENCOUNTER — Ambulatory Visit (INDEPENDENT_AMBULATORY_CARE_PROVIDER_SITE_OTHER): Payer: BLUE CROSS/BLUE SHIELD | Admitting: Family Medicine

## 2016-06-23 ENCOUNTER — Encounter: Payer: Self-pay | Admitting: Family Medicine

## 2016-06-23 VITALS — BP 138/82 | HR 64 | Temp 97.7°F | Ht 66.0 in | Wt 189.0 lb

## 2016-06-23 DIAGNOSIS — Z0000 Encounter for general adult medical examination without abnormal findings: Secondary | ICD-10-CM | POA: Insufficient documentation

## 2016-06-23 DIAGNOSIS — I10 Essential (primary) hypertension: Secondary | ICD-10-CM

## 2016-06-23 DIAGNOSIS — I471 Supraventricular tachycardia: Secondary | ICD-10-CM

## 2016-06-23 DIAGNOSIS — I509 Heart failure, unspecified: Secondary | ICD-10-CM

## 2016-06-23 DIAGNOSIS — C20 Malignant neoplasm of rectum: Secondary | ICD-10-CM

## 2016-06-23 DIAGNOSIS — E785 Hyperlipidemia, unspecified: Secondary | ICD-10-CM | POA: Diagnosis not present

## 2016-06-23 DIAGNOSIS — E871 Hypo-osmolality and hyponatremia: Secondary | ICD-10-CM | POA: Insufficient documentation

## 2016-06-23 DIAGNOSIS — I429 Cardiomyopathy, unspecified: Secondary | ICD-10-CM

## 2016-06-23 DIAGNOSIS — G4700 Insomnia, unspecified: Secondary | ICD-10-CM

## 2016-06-23 NOTE — Assessment & Plan Note (Signed)
Well controlled on current rxs. No changes made today. 

## 2016-06-23 NOTE — Assessment & Plan Note (Signed)
Reviewed preventive care protocols, scheduled due services, and updated immunizations Discussed nutrition, exercise, diet, and healthy lifestyle.  

## 2016-06-23 NOTE — Assessment & Plan Note (Signed)
Intolerant to statins. Continue zetia.

## 2016-06-23 NOTE — Progress Notes (Signed)
Subjective:   Patient ID: Curtis Campos, male    DOB: Mar 25, 1941, 76 y.o.   MRN: [id number]  Curtis Campos is a pleasant 76 y.o. year old male who presents to clinic today with Medicare Wellness  on 06/23/2016  HPI:   I have personally reviewed the Medicare Annual Wellness questionnaire and have noted 1. The patient's medical and social history 2. Their use of alcohol, tobacco or illicit drugs 3. Their current medications and supplements 4. The patient's functional ability including ADL's, fall risks, home safety risks and hearing or visual             impairment. 5. Diet and physical activities 6. Evidence for depression or mood disorders  End of life wishes discussed and updated in Social History.  The roster of all physicians providing medical care to patient - is listed in the CareTeams section of the chart.  Pneumovax 03/09/07 Prevnar 13 01/29/14 Zoster 04/21/13 Colonoscopy 01/02/11   History of rectal cancer- followed by Dr. Lindi Adie.  Was last seen on 03/13/16.  Note reviewed. CEAs have been normal and pt refusing colonoscopy.  On continued surveillance.  CHF, CAD, HTN, HLD- followed by Dr. Fletcher Anon. Last seen on 12/13/15. Note reviewed. Losartan added at that [place].  Echo also repeated. Seeing him again this month.  Intolerant to statins. Advised to continue zetia. Lab Results  Component Value Date   CHOL 211 (H) 06/12/2016   HDL 68.20 06/12/2016   LDLCALC 118 (H) 06/12/2016   LDLDIRECT 129.7 04/14/2012   TRIG 128.0 06/12/2016   CHOLHDL 3 06/12/2016   Lab Results  Component Value Date   ALT 12 06/12/2016   AST 16 06/12/2016   ALKPHOS 63 06/12/2016   BILITOT 0.9 06/12/2016    Lab Results  Component Value Date   PSA 0.82 06/12/2016   PSA 0.78 02/04/2012   PSA 0.64 02/27/2006    Current Outpatient Prescriptions on File Prior to Visit  Medication Sig Dispense Refill  . albuterol (PROVENTIL HFA;VENTOLIN HFA) 108 (90 Base) MCG/ACT inhaler Inhale 2 puffs  into the lungs every 6 (six) hours as needed. 1 Inhaler 0  . aspirin 81 MG tablet Take 81 mg by mouth daily. Takes at night     . benzonatate (TESSALON) 200 MG capsule Take 1 capsule (200 mg total) by mouth 2 (two) times daily as needed for cough. 20 capsule 0  . clonazePAM (KLONOPIN) 0.5 MG tablet Take 1-3 tablets (0.5-1.5 mg total) by mouth at bedtime as needed for anxiety (or sleep). 45 tablet 0  . ezetimibe (ZETIA) 10 MG tablet Take 1 tablet (10 mg total) by mouth daily. OFFICE VISIT WITH LABS REQUIRED FOR ADDITIONAL REFILLS 30 tablet 0  . losartan (COZAAR) 25 MG tablet TAKE 1 TABLET BY MOUTH ONCE DAILY. 30 tablet 0  . Magnesium Oxide 400 (240 Mg) MG TABS TAKE 1 TABLET BY MOUTH ONCE DAILY. 90 tablet 0  . metoprolol succinate (TOPROL-XL) 100 MG 24 hr tablet TAKE (1) TABLET BY MOUTH DAILY WITH FOOD 30 tablet 0  . niacin (NIASPAN) 1000 MG CR tablet TAKE ONE TABLET BY MOUTH AT BEDTIME. 90 tablet 0   No current facility-administered medications on file prior to visit.     Allergies  Allergen Reactions  . Codeine Nausea Only  . Statins Other (See Comments)    REACTION: maylgias    Past Medical History:  Diagnosis Date  . Arthritis 03/12/2011  . CHF (congestive heart failure) ([place])   . Chronic obstructive lung disease (  [place])   . Chronic systolic heart failure ([place]) 03/2013   EF of 25% with moderate to severe mitral regurgitation  . Coronary artery disease 03/2013   EF of 25%, moderate to severe mitral regurgitation. Catheterization showed normal cardiac output with mild pulmonary hypertension. There was 99% stenosis in the mid left circumflex which appeared to be chronic with akinetic basal inferior/posterior wall. Mild LAD and RCA disease.  Marland Kitchen Hyperlipidemia   . Hypertension   . Mitral regurgitation   . Rectal cancer ([place])   . Renal cancer [facility])     Past Surgical History:  Procedure Laterality Date  . APPENDECTOMY  06/2003  . CARDIAC CATHETERIZATION  03/2013   [facility]  . cataract  surgery    . COLOSTOMY    . HERNIA REPAIR    . resection of rectum      Family History  Problem Relation Age of Onset  . Heart disease Mother   . Heart disease Father   . Hyperlipidemia Father   . Hypertension Father   . Heart attack Brother 5    MI  . Heart disease Sister     stents placed   . Heart disease Sister     CABG    Social History   Social History  . Marital status: Married    Spouse name: N/A  . Number of children: N/A  . Years of education: N/A   Occupational History  . Not on file.   Social History Main Topics  . Smoking status: Former Smoker    Packs/day: 1.00    Years: 50.00    Types: Cigarettes    Quit date: 03/25/2013  . Smokeless tobacco: Never Used  . Alcohol use No  . Drug use: No  . Sexual activity: Yes   Other Topics Concern  . Not on file   Social History Narrative  . No narrative on file   The PMH, PSH, Social History, Family History, Medications, and allergies have been reviewed in [facility], and have been updated if relevant.   Review of Systems  Constitutional: Negative.   HENT: Negative.   Eyes: Negative.   Respiratory: Negative.   Cardiovascular: Negative.   Gastrointestinal: Negative.   Endocrine: Negative.   Genitourinary: Negative.   Musculoskeletal: Negative.   Allergic/Immunologic: Negative.   Neurological: Negative.   Psychiatric/Behavioral: Negative.   All other systems reviewed and are negative.      Objective:    BP 138/82   Pulse 64   Temp 97.7 F (36.5 C)   Ht '5\' 6"'$  (1.676 m)   Wt 189 lb (85.7 kg)   SpO2 95%   BMI 30.51 kg/m    Physical Exam  General:  pleasant male in no acute distress Eyes:  PERRL Ears:  External ear exam shows no significant lesions or deformities.  TMs normal bilaterally Hearing is grossly normal bilaterally. Nose:  External nasal examination shows no deformity or inflammation. Nasal mucosa are pink and moist without lesions or exudates. Mouth:  Oral mucosa and oropharynx  without lesions or exudates.  Teeth in good repair. Neck:  no carotid bruit or thyromegaly no cervical or supraclavicular lymphadenopathy  Lungs:  Normal respiratory effort, chest expands symmetrically. Lungs are clear to auscultation, no crackles or wheezes. Heart:  Normal rate and regular rhythm. S1 and S2 normal without gallop, murmur, click, rub or other extra sounds. Abdomen:  Bowel sounds positive,abdomen soft and non-tender without masses, organomegaly or hernias noted. Pulses:  R and L posterior tibial pulses are  full and equal bilaterally  Extremities:  no edema  Psych:  Good eye contact, not anxious or depressed appearing       Assessment & Plan:   Medicare annual wellness visit, subsequent  Hyperlipidemia, unspecified hyperlipidemia type  Insomnia, unspecified type  Multifocal atrial tachycardia ([place])  CHF with cardiomyopathy ([place])  Malignant neoplasm of rectum ([place])  Essential hypertension No Follow-up on file.

## 2016-06-23 NOTE — Patient Instructions (Addendum)
Great to see you.  Please say hello to your wife for me.  Try adding back a little salt in your diet.

## 2016-06-23 NOTE — Assessment & Plan Note (Signed)
The patients weight, height, BMI and visual acuity have been recorded in the chart.  Cognitive function assessed.   I have made referrals, counseling and provided education to the patient based review of the above and I have provided the pt with a written personalized care plan for preventive services.  

## 2016-06-23 NOTE — Assessment & Plan Note (Signed)
Followed by oncology. Has been doing well with yearly follow up/CEA surveillance.

## 2016-06-23 NOTE — Assessment & Plan Note (Signed)
Chronic, asymptomatic. Advised adding back a little salt in his diet, he is not drinking excessive water.

## 2016-06-25 ENCOUNTER — Encounter: Payer: Self-pay | Admitting: Cardiovascular Disease

## 2016-06-25 ENCOUNTER — Ambulatory Visit (INDEPENDENT_AMBULATORY_CARE_PROVIDER_SITE_OTHER): Payer: BLUE CROSS/BLUE SHIELD | Admitting: Cardiovascular Disease

## 2016-06-25 VITALS — BP 120/70 | HR 83 | Ht 66.0 in | Wt 188.8 lb

## 2016-06-25 DIAGNOSIS — R05 Cough: Secondary | ICD-10-CM

## 2016-06-25 DIAGNOSIS — I340 Nonrheumatic mitral (valve) insufficiency: Secondary | ICD-10-CM

## 2016-06-25 DIAGNOSIS — I2510 Atherosclerotic heart disease of native coronary artery without angina pectoris: Secondary | ICD-10-CM

## 2016-06-25 DIAGNOSIS — I10 Essential (primary) hypertension: Secondary | ICD-10-CM | POA: Diagnosis not present

## 2016-06-25 DIAGNOSIS — I5022 Chronic systolic (congestive) heart failure: Secondary | ICD-10-CM

## 2016-06-25 DIAGNOSIS — R059 Cough, unspecified: Secondary | ICD-10-CM

## 2016-06-25 MED ORDER — SPIRONOLACTONE 25 MG PO TABS: 25.0000 mg | ORAL_TABLET | Freq: Every day | ORAL | 5 refills | Status: DC

## 2016-06-25 NOTE — Progress Notes (Signed)
Cardiology Office Note   Date:  06/25/2016   ID:  Curtis Campos, DOB 16-Jul-1940, MRN [id number]  PCP:  Arnette Norris, MD  Cardiologist:   Kathlyn Sacramento, MD   Chief Complaint  Patient presents with  . othe r    6 month follow up as well as discuss Echo. Meds reviewed by the pt. verbally. "doing well."      History of Present Illness: Curtis Campos is a 76 y.o. male who presents for a followup visit regarding chronic systolic heart failure, coronary artery disease and mitral regurgitation.  He has known history of hypertension, Previous rectal cancer, previous tobacco use and COPD.  Echocardiogram in 2015 showed an EF of 25% with moderate mitral regurgitation A right and left cardiac catheterization showed severe one-vessel coronary artery disease with a subtotal occlusion of the mid left circumflex which appeared to be chronic.  Most recent echocardiogram in October 2017 showed an EF of 40-45%, moderate aortic stenosis and moderate mitral regurgitation.  He was started on losartan during last visit. He has been doing well and denies any chest pain. He has mild exertional dyspnea and has been having dry cough for the last few weeks. He was noted to have hyponatremia recently. No orthopnea, PND or leg edema.  Past Medical History:  Diagnosis Date  . Arthritis 03/12/2011  . CHF (congestive heart failure) ([place])   . Chronic obstructive lung disease ([place])   . Chronic systolic heart failure ([place]) 03/2013   EF of 25% with moderate to severe mitral regurgitation  . Coronary artery disease 03/2013   EF of 25%, moderate to severe mitral regurgitation. Catheterization showed normal cardiac output with mild pulmonary hypertension. There was 99% stenosis in the mid left circumflex which appeared to be chronic with akinetic basal inferior/posterior wall. Mild LAD and RCA disease.  Marland Kitchen Hyperlipidemia   . Hypertension   . Mitral regurgitation   . Rectal cancer ([place])   . Renal cancer [facility])       Past Surgical History:  Procedure Laterality Date  . APPENDECTOMY  06/2003  . CARDIAC CATHETERIZATION  03/2013   [facility]  . cataract surgery    . COLOSTOMY    . HERNIA REPAIR    . resection of rectum       Current Outpatient Prescriptions  Medication Sig Dispense Refill  . albuterol (PROVENTIL HFA;VENTOLIN HFA) 108 (90 Base) MCG/ACT inhaler Inhale 2 puffs into the lungs every 6 (six) hours as needed. 1 Inhaler 0  . aspirin 81 MG tablet Take 81 mg by mouth daily. Takes at night     . benzonatate (TESSALON) 200 MG capsule Take 1 capsule (200 mg total) by mouth 2 (two) times daily as needed for cough. 20 capsule 0  . clonazePAM (KLONOPIN) 0.5 MG tablet Take 1-3 tablets (0.5-1.5 mg total) by mouth at bedtime as needed for anxiety (or sleep). 45 tablet 0  . ezetimibe (ZETIA) 10 MG tablet Take 1 tablet (10 mg total) by mouth daily. OFFICE VISIT WITH LABS REQUIRED FOR ADDITIONAL REFILLS 30 tablet 0  . losartan (COZAAR) 25 MG tablet TAKE 1 TABLET BY MOUTH ONCE DAILY. 30 tablet 0  . Magnesium Oxide 400 (240 Mg) MG TABS TAKE 1 TABLET BY MOUTH ONCE DAILY. 90 tablet 0  . metoprolol succinate (TOPROL-XL) 100 MG 24 hr tablet TAKE (1) TABLET BY MOUTH DAILY WITH FOOD 30 tablet 0  . niacin (NIASPAN) 1000 MG CR tablet TAKE ONE TABLET BY MOUTH AT BEDTIME. 90 tablet  0   No current facility-administered medications for this visit.     Allergies:   Codeine and Statins    Social History:  The patient  reports that he quit smoking about 3 years ago. His smoking use included Cigarettes. He has a 50.00 pack-year smoking history. He has never used smokeless tobacco. He reports that he does not drink alcohol or use drugs.   Family History:  The patient's family history includes Heart attack (age of onset: 5) in his brother; Heart disease in his father, mother, sister, and sister; Hyperlipidemia in his father; Hypertension in his father.    ROS:  Please see the history of present illness.   Otherwise,  review of systems are positive for none.   All other systems are reviewed and negative.    PHYSICAL EXAM: VS:  BP 120/70 (BP Location: Left Arm, Patient Position: Sitting, Cuff Size: Normal)   Pulse 83   Ht '5\' 6"'$  (1.676 m)   Wt 188 lb 12 oz (85.6 kg)   BMI 30.47 kg/m  , BMI Body mass index is 30.47 kg/m. GEN: Well nourished, well developed, in no acute distress  HEENT: normal  Neck: no JVD, carotid bruits, or masses Cardiac: RRR with premature beats; rubs, or gallops,no edema . 2 /mid peaking crescendo decrescendo murmur in the aortic area. 2/6 holosystolic murmur at the apex. Respiratory:  clear to auscultation bilaterally, normal work of breathing GI: soft, nontender, nondistended, + BS MS: no deformity or atrophy  Skin: warm and dry, no rash Neuro:  Strength and sensation are intact Psych: euthymic mood, full affect   EKG:  EKG is ordered today. The ekg ordered today demonstrates sinus rhythm with sinus arrhythmia. No significant ST or T wave changes.   Recent Labs: 06/12/2016: ALT 12; BUN 18; Creatinine, Ser 0.90; Hemoglobin 12.9; Platelets 297.0; Potassium 4.9; Sodium 126    Lipid Panel    Component Value Date/Time   CHOL 211 (H) 06/12/2016 1247   TRIG 128.0 06/12/2016 1247   HDL 68.20 06/12/2016 1247   CHOLHDL 3 06/12/2016 1247   VLDL 25.6 06/12/2016 1247   LDLCALC 118 (H) 06/12/2016 1247   LDLDIRECT 129.7 04/14/2012 0756      Wt Readings from Last 3 Encounters:  06/25/16 188 lb 12 oz (85.6 kg)  06/23/16 189 lb (85.7 kg)  05/12/16 190 lb (86.2 kg)         ASSESSMENT AND PLAN:  1. Chronic systolic heart failure: The patient appears to be euvolemic without any diuretics and currently New York Heart Association class II. Most recent ejection fraction was 40-45%. Continue treatment with Toprol, spironolactone and losartan.  2. Coronary artery disease: Currently with no anginal symptoms. Continue medical therapy.  3. Essential hypertension: Blood pressure  is well controlled on current medications.  4. Hyperlipidemia: He is intolerant to statins and currently is on Zetia.    5. Moderate aortic stenosis and moderate mitral regurgitation. Repeat echocardiogram in 2019.  6. Cough: He is a previous smoker and thus I ordered a chest x-ray.  7. Hyponatremia: The exact etiology of this is not entirely clear as he is not on a thiazide diuretic. Recommend repeat labs within one month and if is still low, consider nephrology consultation.  8. The patient has symptoms suggestive of sleep apnea noted by the wife. Consider sleep study.  Disposition:   FU with me in 6 months  Signed,  Kathlyn Sacramento, MD  06/25/2016 2:37 PM    [place]

## 2016-06-25 NOTE — Patient Instructions (Addendum)
Medication Instructions:  Your physician recommends that you continue on your current medications as directed. Please refer to the Current Medication list given to you today.   Labwork: none  Testing/Procedures: A chest x-ray takes a picture of the organs and structures inside the chest, including the heart, lungs, and blood vessels. This test can show several things, including, whether the heart is enlarges; whether fluid is building up in the lungs; and whether pacemaker / defibrillator leads are still in place.   Follow-Up: Your physician wants you to follow-up in: six months with Dr. Fletcher Anon.  You will receive a reminder letter in the mail two months in advance. If you don't receive a letter, please call our office to schedule the follow-up appointment.   Any Other Special Instructions Will Be Listed Below (If Applicable).     If you need a refill on your cardiac medications before your next appointment, please call your pharmacy.   X-rays X-rays are tests that create pictures of the inside of your body using radiation. Different body parts absorb different amounts of radiation, which show up on the X-ray pictures in shades of black, gray, and white. X-rays are used to look for many health conditions, including broken bones, lung problems, and some types of cancer. Tell a health care provider about:  Any allergies you have.  All medicines you are taking, including vitamins, herbs, eye drops, creams, and over-the-counter medicines.  Previous surgeries you have had.  Medical conditions you have. What are the risks? Getting an X-ray is a safe procedure. What happens before the procedure?  Tell the X-ray technician if you are pregnant or might be pregnant.  You may be asked to wear a protective lead apron to hide parts of your body from the X-ray.  You usually will need to undress whatever part of your body needs the X-ray. You will be given a hospital gown to wear, if  needed.  You may need to remove your glasses, jewelry, and other metal objects. What happens during the procedure?  The X-ray machine creates a picture by using a tiny burst of radiation. It is painless.  You may need to have several pictures taken at different angles.  You will need to try to be as still as you can during the examination to get the best possible images. What happens after the procedure?  You will be able to resume your normal activities.  The X-ray will be examined by your health care provider or a radiology specialist.  It is your responsibility to get your test results. Ask your health care provider when to expect your results and how to get them. This information is not intended to replace advice given to you by your health care provider. Make sure you discuss any questions you have with your health care provider. Document Released: 03/16/2005 Document Revised: 11/14/2015 Document Reviewed: 05/10/2013 Elsevier Interactive Patient Education  2017 [employer].

## 2016-07-02 ENCOUNTER — Other Ambulatory Visit: Payer: Self-pay | Admitting: Family Medicine

## 2016-07-02 ENCOUNTER — Other Ambulatory Visit: Payer: Self-pay | Admitting: Cardiovascular Disease

## 2016-07-02 NOTE — Telephone Encounter (Signed)
Requested Prescriptions   Signed Prescriptions Disp Refills  . losartan (COZAAR) 25 MG tablet 30 tablet 0    Sig: TAKE 1 TABLET BY MOUTH ONCE DAILY.    Authorizing Provider: Kathlyn Sacramento A    Ordering User: Janan Ridge

## 2016-07-06 ENCOUNTER — Other Ambulatory Visit: Payer: Self-pay | Admitting: Family Medicine

## 2016-07-24 ENCOUNTER — Other Ambulatory Visit: Payer: Self-pay

## 2016-07-24 MED ORDER — CLONAZEPAM 0.5 MG PO TABS: 0.5000 mg | ORAL_TABLET | Freq: Every evening | ORAL | 0 refills | Status: DC | PRN

## 2016-07-24 NOTE — Telephone Encounter (Signed)
Last filled 06-08-16 #45 Last OV 06-23-16 CPE No Future OV

## 2016-07-24 NOTE — Telephone Encounter (Signed)
Ok to phone in Clonazepam 

## 2016-07-24 NOTE — Telephone Encounter (Signed)
Left refill on voice mail at pharmacy  

## 2016-07-31 ENCOUNTER — Other Ambulatory Visit: Payer: Self-pay | Admitting: Cardiovascular Disease

## 2016-07-31 NOTE — Telephone Encounter (Signed)
Requested Prescriptions   Signed Prescriptions Disp Refills  . losartan (COZAAR) 25 MG tablet 30 tablet 0    Sig: TAKE 1 TABLET BY MOUTH ONCE DAILY.    Authorizing Provider: Kathlyn Sacramento A    Ordering User: Janan Ridge

## 2016-07-31 NOTE — Telephone Encounter (Signed)
Requested Prescriptions   Signed Prescriptions Disp Refills  . metoprolol succinate (TOPROL-XL) 100 MG 24 hr tablet 30 tablet 0    Sig: TAKE (1) TABLET BY MOUTH DAILY WITH FOOD    Authorizing Provider: Kathlyn Sacramento A    Ordering User: Janan Ridge

## 2016-08-04 ENCOUNTER — Other Ambulatory Visit: Payer: Self-pay | Admitting: Family Medicine

## 2016-08-31 ENCOUNTER — Other Ambulatory Visit: Payer: Self-pay | Admitting: Cardiovascular Disease

## 2016-09-07 ENCOUNTER — Other Ambulatory Visit: Payer: Self-pay

## 2016-09-07 MED ORDER — CLONAZEPAM 0.5 MG PO TABS: 0.5000 mg | ORAL_TABLET | Freq: Every evening | ORAL | 0 refills | Status: DC | PRN

## 2016-09-07 NOTE — Telephone Encounter (Signed)
Received a fax for a refill request for KLONOPIN Last refill 07/24/16, last OV 06/23/16

## 2016-09-07 NOTE — Telephone Encounter (Signed)
Called in to [place], [place] - [place]: [phone]

## 2016-09-17 ENCOUNTER — Ambulatory Visit: Payer: BLUE CROSS/BLUE SHIELD | Admitting: Family Medicine

## 2016-09-17 ENCOUNTER — Encounter: Payer: Self-pay | Admitting: Family Medicine

## 2016-09-17 ENCOUNTER — Ambulatory Visit (INDEPENDENT_AMBULATORY_CARE_PROVIDER_SITE_OTHER): Payer: BLUE CROSS/BLUE SHIELD | Admitting: Family Medicine

## 2016-09-17 VITALS — BP 149/77 | HR 95 | Temp 98.3°F | Ht 66.0 in | Wt 185.5 lb

## 2016-09-17 DIAGNOSIS — M170 Bilateral primary osteoarthritis of knee: Secondary | ICD-10-CM | POA: Diagnosis not present

## 2016-09-17 DIAGNOSIS — I2510 Atherosclerotic heart disease of native coronary artery without angina pectoris: Secondary | ICD-10-CM

## 2016-09-17 MED ORDER — METHYLPREDNISOLONE ACETATE 40 MG/ML IJ SUSP
80.0000 mg | Freq: Once | INTRAMUSCULAR | Status: AC
  Administered 2016-09-17: 80 mg via INTRA_ARTICULAR

## 2016-09-17 NOTE — Progress Notes (Signed)
Dr. Frederico Hamman T. Zoya Sprecher, MD, [facility] [place], [address] Phone: [phone] Fax: [phone]  09/17/2016  Patient: Curtis Campos, MRN: [id number], DOB: 10/04/1940, 76 y.o.  Primary Physician:  Lucille Passy, MD   Chief Complaint  Patient presents with  . Knee Pain    Bialteral Knee Injections   Subjective:   Curtis Campos is a 76 y.o. very pleasant male patient who presents with the following:  B knee OA: Pleasant gentleman is 76 years old who still works and presents with bilateral knee pain with a known history of bilateral knee osteoarthritis. I last saw him approximately 18 months ago and at that point did a knee injection and this relieved his symptoms was approximately 6-8 months.  B inj  Past Medical History, Surgical History, Social History, Family History, Problem List, Medications, and Allergies have been reviewed and updated if relevant.  Patient Active Problem List   Diagnosis Date Noted  . Medicare annual wellness visit, subsequent 06/23/2016  . Chronic hyponatremia 06/23/2016  . History of bacterial pneumonia 01/29/2014  . Mitral regurgitation   . CHF with cardiomyopathy ([place]) 04/21/2013  . Coronary artery disease 03/30/2013  . Chronic systolic heart failure ([place]) 03/30/2013  . Insomnia 03/29/2013  . Multifocal atrial tachycardia ([place]) 01/17/2013  . Arthritis 03/12/2011  . Episodic mood disorder ([place]) 04/06/2008  . Essential hypertension 03/06/2008  . Nicotine dependence 03/10/2007  . HLD (hyperlipidemia) 03/18/2006  . Malignant neoplasm of rectum ([place]) 03/31/2003    Past Medical History:  Diagnosis Date  . Arthritis 03/12/2011  . CHF (congestive heart failure) ([place])   . Chronic obstructive lung disease ([place])   . Chronic systolic heart failure ([place]) 03/2013   EF of 25% with moderate to severe mitral regurgitation  . Coronary artery disease 03/2013   EF of 25%, moderate to severe  mitral regurgitation. Catheterization showed normal cardiac output with mild pulmonary hypertension. There was 99% stenosis in the mid left circumflex which appeared to be chronic with akinetic basal inferior/posterior wall. Mild LAD and RCA disease.  Marland Kitchen Hyperlipidemia   . Hypertension   . Mitral regurgitation   . Rectal cancer ([place])   . Renal cancer [facility])     Past Surgical History:  Procedure Laterality Date  . APPENDECTOMY  06/2003  . CARDIAC CATHETERIZATION  03/2013   [facility]  . cataract surgery    . COLOSTOMY    . HERNIA REPAIR    . resection of rectum      Social History   Social History  . Marital status: Married    Spouse name: N/A  . Number of children: N/A  . Years of education: N/A   Occupational History  . Not on file.   Social History Main Topics  . Smoking status: Former Smoker    Packs/day: 1.00    Years: 50.00    Types: Cigarettes    Quit date: 03/25/2013  . Smokeless tobacco: Never Used  . Alcohol use No  . Drug use: No  . Sexual activity: Yes   Other Topics Concern  . Not on file   Social History Narrative  . No narrative on file    Family History  Problem Relation Age of Onset  . Heart disease Mother   . Heart disease Father   . Hyperlipidemia Father   . Hypertension Father   . Heart attack Brother 13       MI  . Heart  disease Sister        stents placed   . Heart disease Sister        CABG    Allergies  Allergen Reactions  . Codeine Nausea Only  . Statins Other (See Comments)    REACTION: maylgias    Medication list reviewed and updated in full in [place].  GEN: No fevers, chills. Nontoxic. Primarily MSK c/o today. MSK: Detailed in the HPI GI: tolerating PO intake without difficulty Neuro: No numbness, parasthesias, or tingling associated. Otherwise the pertinent positives of the ROS are noted above.   Objective:   BP (!) 149/77   Pulse 95   Temp 98.3 F (36.8 C) (Oral)   Ht 5\' 6"  (1.676 m)   Wt 185 lb 8 oz  (84.1 kg)   BMI 29.94 kg/m    GEN: WDWN, NAD, Non-toxic, Alert & Oriented x 3 HEENT: Atraumatic, Normocephalic.  Ears and Nose: No external deformity. EXTR: No clubbing/cyanosis/edema NEURO: Normal gait.  PSYCH: Normally interactive. Conversant. Not depressed or anxious appearing.  Calm demeanor.   Knee:  B Gait: Normal heel toe pattern ROM: 0-100 on R, 0-115 on the L Effusion: mild on the R Echymosis or edema: none Patellar tendon NT Painful PLICA: neg Patellar grind: negative Medial and lateral patellar facet loading: negative medial and lateral joint lines: mild, medial > lateral Mcmurray's neg Flexion-pinch pain Varus and valgus stress: stable Lachman: neg Ant and Post drawer: neg Hip abduction, IR, ER: WNL Hip flexion str: 5/5 Hip abd: 5/5 Quad: 5/5 VMO atrophy:No Hamstring concentric and eccentric: 5/5   Radiology: No results found.  Assessment and Plan:   Arthritis of both knees - Plan: methylPREDNISolone acetate (DEPO-MEDROL) injection 80 mg, methylPREDNISolone acetate (DEPO-MEDROL) injection 80 mg  He is doing well with conservative care of his knees. If he has diminished return of symptoms from corticosteroid injection, he would be a good candidate for hyaluronic acid injection. For now, he is been a continuing to keep up being active.  Knee Injection, RIGHT Patient verbally consented to procedure. Risks (including potential rare risk of infection), benefits, and alternatives explained. Sterilely prepped with Chloraprep. Ethyl cholride used for anesthesia. 8 cc Lidocaine 1% mixed with 2 mL Depo-Medrol 40 mg injected using the anteromedial approach without difficulty. No complications with procedure and tolerated well. Patient had decreased pain post-injection.   Knee Injection, LEFT Patient verbally consented to procedure. Risks (including potential rare risk of infection), benefits, and alternatives explained. Sterilely prepped with Chloraprep. Ethyl cholride  used for anesthesia. 8 cc Lidocaine 1% mixed with 2 mL Depo-Medrol 40 mg injected using the anteromedial approach without difficulty. No complications with procedure and tolerated well. Patient had decreased pain post-injection.     Follow-up: No Follow-up on file.  Future Appointments Date Time Provider [place]  03/12/2017 10:45 AM [facility] None  03/12/2017 11:15 AM Nicholas Lose, MD [facility] None    Meds ordered this encounter  Medications  . methylPREDNISolone acetate (DEPO-MEDROL) injection 80 mg  . methylPREDNISolone acetate (DEPO-MEDROL) injection 80 mg   Signed,  Anders Hohmann T. Carmesha Morocco, MD   Allergies as of 09/17/2016      Reactions   Codeine Nausea Only   Statins Other (See Comments)   REACTION: maylgias      Medication List       Accurate as of 09/17/16  9:43 AM. Always use your most recent med list.          albuterol 108 (90 Base)  MCG/ACT inhaler Commonly known as:  PROVENTIL HFA;VENTOLIN HFA Inhale 2 puffs into the lungs every 6 (six) hours as needed.   aspirin 81 MG tablet Take 81 mg by mouth daily. Takes at night   benzonatate 200 MG capsule Commonly known as:  TESSALON Take 1 capsule (200 mg total) by mouth 2 (two) times daily as needed for cough.   clonazePAM 0.5 MG tablet Commonly known as:  KLONOPIN Take 1-3 tablets (0.5-1.5 mg total) by mouth at bedtime as needed for anxiety (or sleep).   ezetimibe 10 MG tablet Commonly known as:  ZETIA TAKE 1 TABLET BY MOUTH ONCE DAILY.   losartan 25 MG tablet Commonly known as:  COZAAR TAKE 1 TABLET BY MOUTH ONCE DAILY.   Magnesium Oxide 400 (240 Mg) MG Tabs TAKE 1 TABLET BY MOUTH ONCE DAILY.   metoprolol succinate 100 MG 24 hr tablet Commonly known as:  TOPROL-XL TAKE (1) TABLET BY MOUTH DAILY WITH FOOD   niacin 1000 MG CR tablet Commonly known as:  NIASPAN TAKE ONE TABLET BY MOUTH AT BEDTIME.   spironolactone 25 MG tablet Commonly known as:  ALDACTONE Take 1  tablet (25 mg total) by mouth daily.

## 2016-10-02 ENCOUNTER — Other Ambulatory Visit: Payer: Self-pay | Admitting: Cardiovascular Disease

## 2016-10-21 ENCOUNTER — Other Ambulatory Visit: Payer: Self-pay

## 2016-10-21 MED ORDER — CLONAZEPAM 0.5 MG PO TABS: 0.5000 mg | ORAL_TABLET | Freq: Every evening | ORAL | 1 refills | Status: DC | PRN

## 2016-10-21 NOTE — Telephone Encounter (Signed)
Last refill 09/07/16  Last OV 05/2716 Ok to refill?

## 2016-10-21 NOTE — Telephone Encounter (Signed)
Faxed to  [place], [place]: [phone]

## 2016-10-31 ENCOUNTER — Telehealth: Payer: Self-pay | Admitting: Internal Medicine

## 2016-11-03 ENCOUNTER — Other Ambulatory Visit: Payer: Self-pay | Admitting: Cardiovascular Disease

## 2016-11-04 NOTE — Telephone Encounter (Signed)
Patient Name: ZYION DOXTATER  DOB: [birth year]    Initial Comment Caller states her husband has spells of getting hot and weak. He recently had a cold and she needs to know what she can do.    Nurse Assessment  Nurse: Leilani Merl, RN, Heather Date/Time (Eastern Time): 11/04/2016 1:23:59 PM  Confirm and document reason for call. If symptomatic, describe symptoms. ---Caller states her husband has spells of getting hot and weak. He recently had a cold and she needs to know what she can do. He is at work, he is not with her at this time and he will not be able to speak with the nurse. She would like to make an appt for him, [facility] unable to triage and unable to make an appt.  Does the patient have any new or worsening symptoms? ---Yes  Will a triage be completed? ---No  Select reason for no triage. ---Other  Please document clinical information provided and list any resource used. ---Can someone at the office talk to Dr. Deborra Medina and see when she would like for patient to be seen and call back ASAP.     Guidelines    Guideline Title Affirmed Question Affirmed Notes       Final Disposition User

## 2016-11-05 ENCOUNTER — Encounter: Payer: Self-pay | Admitting: Primary Care

## 2016-11-05 ENCOUNTER — Ambulatory Visit (INDEPENDENT_AMBULATORY_CARE_PROVIDER_SITE_OTHER): Payer: BLUE CROSS/BLUE SHIELD | Admitting: Primary Care

## 2016-11-05 VITALS — BP 140/72 | HR 71 | Temp 98.1°F | Ht 66.0 in | Wt 183.1 lb

## 2016-11-05 DIAGNOSIS — J209 Acute bronchitis, unspecified: Secondary | ICD-10-CM

## 2016-11-05 DIAGNOSIS — I2510 Atherosclerotic heart disease of native coronary artery without angina pectoris: Secondary | ICD-10-CM | POA: Diagnosis not present

## 2016-11-05 MED ORDER — DOXYCYCLINE HYCLATE 100 MG PO TABS: 100.0000 mg | ORAL_TABLET | Freq: Two times a day (BID) | ORAL | 0 refills | Status: DC

## 2016-11-05 MED ORDER — ALBUTEROL SULFATE HFA 108 (90 BASE) MCG/ACT IN AERS: 2.0000 | INHALATION_SPRAY | RESPIRATORY_TRACT | 0 refills | Status: DC | PRN

## 2016-11-05 NOTE — Telephone Encounter (Signed)
Pt has appt today at 12 noon with Allie Bossier NP.

## 2016-11-05 NOTE — Patient Instructions (Signed)
Start Doxycycline antibiotic. Take 1 tablet by mouth twice daily for 7 days.  Shortness of Breath/Wheezing/Cough: Use the albuterol inhaler. Inhale 2 puffs into the lungs every 6 to 8 hours as needed for wheezing and/or shortness of breath.   Continue Mucinex as this will help to break up congestion.  Ensure you are staying hydrated with water and rest.  It was a pleasure meeting you!

## 2016-11-05 NOTE — Progress Notes (Signed)
Subjective:    Patient ID: Curtis Campos, male    DOB: 04/12/1940, 76 y.o.   MRN: [id number]  HPI  Curtis Campos is a 76 year old male wit a history of pneumonia, tobacco abuse, hypertension, CHF who presents today with a chief complaint of cough. He also reports chest congestion and nasal congestion. His cough is productive with greenish sputum. His symptoms began nearly two weeks ago. Overall he's feeling slightly worse has he had a dizzy and "sweaty" spell at work yesterday. He felt better after his spell after eating a cookie. He denies fevers. He's taken mucinex, OTC cough syrup without much improvement.   Review of Systems  Constitutional: Positive for fatigue. Negative for chills and fever.  HENT: Positive for congestion. Negative for ear pain, sinus pressure and sore throat.   Respiratory: Positive for cough and shortness of breath. Negative for wheezing.   Cardiovascular: Negative for chest pain.       Past Medical History:  Diagnosis Date  . Arthritis 03/12/2011  . CHF (congestive heart failure) ([place])   . Chronic obstructive lung disease ([place])   . Chronic systolic heart failure ([place]) 03/2013   EF of 25% with moderate to severe mitral regurgitation  . Coronary artery disease 03/2013   EF of 25%, moderate to severe mitral regurgitation. Catheterization showed normal cardiac output with mild pulmonary hypertension. There was 99% stenosis in the mid left circumflex which appeared to be chronic with akinetic basal inferior/posterior wall. Mild LAD and RCA disease.  Marland Kitchen Hyperlipidemia   . Hypertension   . Mitral regurgitation   . Rectal cancer ([place])   . Renal cancer [facility])      Social History   Social History  . Marital status: Married    Spouse name: N/A  . Number of children: N/A  . Years of education: N/A   Occupational History  . Not on file.   Social History Main Topics  . Smoking status: Former Smoker    Packs/day: 1.00    Years: 50.00    Types: Cigarettes    Quit date: 03/25/2013  . Smokeless tobacco: Never Used  . Alcohol use No  . Drug use: No  . Sexual activity: Yes   Other Topics Concern  . Not on file   Social History Narrative  . No narrative on file    Past Surgical History:  Procedure Laterality Date  . APPENDECTOMY  06/2003  . CARDIAC CATHETERIZATION  03/2013   [facility]  . cataract surgery    . COLOSTOMY    . HERNIA REPAIR    . resection of rectum      Family History  Problem Relation Age of Onset  . Heart disease Mother   . Heart disease Father   . Hyperlipidemia Father   . Hypertension Father   . Heart attack Brother 48       MI  . Heart disease Sister        stents placed   . Heart disease Sister        CABG    Allergies  Allergen Reactions  . Codeine Nausea Only  . Statins Other (See Comments)    REACTION: maylgias    Current Outpatient Prescriptions on File Prior to Visit  Medication Sig Dispense Refill  . aspirin 81 MG tablet Take 81 mg by mouth daily. Takes at night     . clonazePAM (KLONOPIN) 0.5 MG tablet Take 1-3 tablets (0.5-1.5 mg total) by mouth at bedtime as needed  for anxiety (or sleep). 45 tablet 1  . ezetimibe (ZETIA) 10 MG tablet TAKE 1 TABLET BY MOUTH ONCE DAILY. 90 tablet 3  . losartan (COZAAR) 25 MG tablet TAKE 1 TABLET BY MOUTH ONCE DAILY. 30 tablet 3  . Magnesium Oxide 400 (240 Mg) MG TABS TAKE 1 TABLET BY MOUTH ONCE DAILY. 90 tablet 0  . metoprolol succinate (TOPROL-XL) 100 MG 24 hr tablet TAKE (1) TABLET BY MOUTH DAILY WITH FOOD 30 tablet 3  . niacin (NIASPAN) 1000 MG CR tablet TAKE ONE TABLET BY MOUTH AT BEDTIME. 90 tablet 3  . spironolactone (ALDACTONE) 25 MG tablet Take 1 tablet (25 mg total) by mouth daily. 30 tablet 5   No current facility-administered medications on file prior to visit.     BP 140/72   Pulse 71   Temp 98.1 F (36.7 C) (Oral)   Ht 5\' 6"  (1.676 m)   Wt 183 lb 1.9 oz (83.1 kg)   SpO2 97%   BMI 29.56 kg/m    Objective:   Physical Exam  Constitutional:  He appears well-nourished. He does not appear ill.  HENT:  Right Ear: Tympanic membrane and ear canal normal.  Left Ear: Tympanic membrane and ear canal normal.  Nose: No mucosal edema. Right sinus exhibits no maxillary sinus tenderness and no frontal sinus tenderness. Left sinus exhibits no maxillary sinus tenderness and no frontal sinus tenderness.  Mouth/Throat: Oropharynx is clear and moist.  Eyes: Conjunctivae are normal.  Neck: Neck supple.  Cardiovascular: Normal rate and regular rhythm.   Pulmonary/Chest: Effort normal. He has no wheezes. He has rhonchi. He has no rales.  Mild rhonchi throughout all fields. Productive cough during exam today.  Skin: Skin is warm and dry.          Assessment & Plan:  Acute Bronchitis:  Cough, congestion, fatigue, SOB x 2 weeks. Overall no improvement, dizzy spell yesterday. Exam today consistent for acute bronchitis.  Rx for Doxycycline course sent to pharmacy. Refilled albuterol inhaler. Continue Mucinex. Fluids, rest, follow up PRN.  Sheral Flow, NP

## 2016-11-05 NOTE — Telephone Encounter (Signed)
Noted  

## 2016-11-19 ENCOUNTER — Ambulatory Visit (INDEPENDENT_AMBULATORY_CARE_PROVIDER_SITE_OTHER): Payer: BLUE CROSS/BLUE SHIELD | Admitting: Family Medicine

## 2016-11-19 ENCOUNTER — Encounter: Payer: Self-pay | Admitting: Family Medicine

## 2016-11-19 VITALS — BP 138/76 | HR 78 | Temp 98.0°F | Resp 16 | Ht 66.0 in | Wt 184.4 lb

## 2016-11-19 DIAGNOSIS — Z8701 Personal history of pneumonia (recurrent): Secondary | ICD-10-CM | POA: Diagnosis not present

## 2016-11-19 DIAGNOSIS — J069 Acute upper respiratory infection, unspecified: Secondary | ICD-10-CM | POA: Insufficient documentation

## 2016-11-19 DIAGNOSIS — I2510 Atherosclerotic heart disease of native coronary artery without angina pectoris: Secondary | ICD-10-CM | POA: Diagnosis not present

## 2016-11-19 MED ORDER — AZITHROMYCIN 250 MG PO TABS: ORAL_TABLET | ORAL | 0 refills | Status: DC

## 2016-11-19 MED ORDER — PROMETHAZINE-DM 6.25-15 MG/5ML PO SYRP: 5.0000 mL | ORAL_SOLUTION | Freq: Four times a day (QID) | ORAL | 0 refills | Status: DC | PRN

## 2016-11-19 NOTE — Assessment & Plan Note (Signed)
Cough became worse and more productive after he finished doxycyline.  Has rhonchi on exam. Will treat with zpack, promethazine DM as needed for severe cough.

## 2016-11-19 NOTE — Patient Instructions (Signed)
Great to see you. Please take zpack as directed, promethazine dm as needed for severe cough.

## 2016-11-19 NOTE — Progress Notes (Signed)
Subjective:   Patient ID: Curtis Campos, male    DOB: 1940-08-05, 76 y.o.   MRN: [id number]  Curtis Campos is a pleasant 76 y.o. year old male who presents to clinic today with Cough (congestion, fatigue, wheezing, 2 week ago )  on 11/19/2016  HPI:  Cough/congestion- Saw Allie Bossier for this complaint on 11/05/16. Note reviewed.  On exam, productive cough and rhonchi noted.  Treated with course of doxycyline, albuterol inhaler refilled and advised mucinex.  Here today because cough improved quit a bit but once he finished 7 days of doxycyline, became worse and more productive again.   Current Outpatient Prescriptions on File Prior to Visit  Medication Sig Dispense Refill  . albuterol (PROVENTIL HFA;VENTOLIN HFA) 108 (90 Base) MCG/ACT inhaler Inhale 2 puffs into the lungs every 4 (four) hours as needed for shortness of breath. 1 Inhaler 0  . aspirin 81 MG tablet Take 81 mg by mouth daily. Takes at night     . clonazePAM (KLONOPIN) 0.5 MG tablet Take 1-3 tablets (0.5-1.5 mg total) by mouth at bedtime as needed for anxiety (or sleep). 45 tablet 1  . ezetimibe (ZETIA) 10 MG tablet TAKE 1 TABLET BY MOUTH ONCE DAILY. 90 tablet 3  . losartan (COZAAR) 25 MG tablet TAKE 1 TABLET BY MOUTH ONCE DAILY. 30 tablet 3  . Magnesium Oxide 400 (240 Mg) MG TABS TAKE 1 TABLET BY MOUTH ONCE DAILY. 90 tablet 0  . metoprolol succinate (TOPROL-XL) 100 MG 24 hr tablet TAKE (1) TABLET BY MOUTH DAILY WITH FOOD 30 tablet 3  . niacin (NIASPAN) 1000 MG CR tablet TAKE ONE TABLET BY MOUTH AT BEDTIME. 90 tablet 3  . spironolactone (ALDACTONE) 25 MG tablet Take 1 tablet (25 mg total) by mouth daily. 30 tablet 5   No current facility-administered medications on file prior to visit.     Allergies  Allergen Reactions  . Codeine Nausea Only  . Statins Other (See Comments)    REACTION: maylgias    Past Medical History:  Diagnosis Date  . Arthritis 03/12/2011  . CHF (congestive heart failure) ([place])   .  Chronic obstructive lung disease ([place])   . Chronic systolic heart failure ([place]) 03/2013   EF of 25% with moderate to severe mitral regurgitation  . Coronary artery disease 03/2013   EF of 25%, moderate to severe mitral regurgitation. Catheterization showed normal cardiac output with mild pulmonary hypertension. There was 99% stenosis in the mid left circumflex which appeared to be chronic with akinetic basal inferior/posterior wall. Mild LAD and RCA disease.  Marland Kitchen Hyperlipidemia   . Hypertension   . Mitral regurgitation   . Rectal cancer ([place])   . Renal cancer [facility])     Past Surgical History:  Procedure Laterality Date  . APPENDECTOMY  06/2003  . CARDIAC CATHETERIZATION  [facility]   [facility]  . cataract surgery    . COLOSTOMY    . HERNIA REPAIR    . resection of rectum      Family History  Problem Relation Age of Onset  . Heart disease Mother   . Heart disease Father   . Hyperlipidemia Father   . Hypertension Father   . Heart attack Brother 52       MI  . Heart disease Sister        stents placed   . Heart disease Sister        CABG    Social History   Social History  . Marital status:  Married    Spouse name: N/A  . Number of children: N/A  . Years of education: N/A   Occupational History  . Not on file.   Social History Main Topics  . Smoking status: Former Smoker    Packs/day: 1.00    Years: 50.00    Types: Cigarettes    Quit date: 03/25/2013  . Smokeless tobacco: Never Used  . Alcohol use No  . Drug use: No  . Sexual activity: Yes   Other Topics Concern  . Not on file   Social History Narrative  . No narrative on file   The PMH, PSH, Social History, Family History, Medications, and allergies have been reviewed in [facility], and have been updated if relevant.  Review of Systems  Constitutional: Negative.   HENT: Negative.   Respiratory: Positive for cough and wheezing. Negative for shortness of breath and stridor.   Gastrointestinal: Negative.     Musculoskeletal: Negative.   Neurological: Negative.   Hematological: Negative.   Psychiatric/Behavioral: Negative.   All other systems reviewed and are negative.      Objective:    BP 138/76   Pulse 78   Temp 98 F (36.7 C) (Oral)   Resp 16   Ht 5\' 6"  (1.676 m)   Wt 184 lb 6.4 oz (83.6 kg)   SpO2 98%   BMI 29.76 kg/m    Physical Exam  Constitutional: He is oriented to person, place, and time. He appears well-developed and well-nourished. No distress.  HENT:  Head: Normocephalic and atraumatic.  Eyes: Conjunctivae are normal.  Cardiovascular: Normal rate and regular rhythm.   Pulmonary/Chest: He has no wheezes. He has rhonchi in the right lower field and the left lower field. He has no rales.  Musculoskeletal: Normal range of motion.  Neurological: He is oriented to person, place, and time. No cranial nerve deficit.  Skin: Skin is warm and dry. He is not diaphoretic.  Psychiatric: He has a normal mood and affect. His behavior is normal.  Nursing note and vitals reviewed.         Assessment & Plan:   History of bacterial pneumonia  Upper respiratory tract infection, unspecified type No Follow-up on file.

## 2016-12-01 ENCOUNTER — Other Ambulatory Visit: Payer: Self-pay | Admitting: Cardiovascular Disease

## 2016-12-07 ENCOUNTER — Other Ambulatory Visit: Payer: Self-pay

## 2016-12-07 MED ORDER — CLONAZEPAM 0.5 MG PO TABS: 0.5000 mg | ORAL_TABLET | Freq: Every evening | ORAL | 1 refills | Status: DC | PRN

## 2016-12-07 NOTE — Telephone Encounter (Signed)
Rx faxed to [facility]

## 2016-12-07 NOTE — Telephone Encounter (Signed)
Rx request for Clonazepam Last refilled 10/21/2016 Last OV 11/19/2016 No new appt  Please advise

## 2016-12-14 MED ORDER — CLONAZEPAM 0.5 MG PO TABS: 0.5000 mg | ORAL_TABLET | Freq: Every evening | ORAL | 1 refills | Status: DC | PRN

## 2016-12-14 NOTE — Addendum Note (Signed)
Addended by: Helene Shoe on: 12/14/2016 02:24 PM   Modules accepted: Orders

## 2016-12-14 NOTE — Telephone Encounter (Signed)
pts wife called to ck on clonazepam refill to [facility]. rx was faxed to [place]; I called [facility] and spoke with Tameka and d/c rx and called rx to [facility]; rx will be ready in 15'.Barnetta Chapel voiced understanding.

## 2016-12-18 ENCOUNTER — Ambulatory Visit (INDEPENDENT_AMBULATORY_CARE_PROVIDER_SITE_OTHER): Payer: BLUE CROSS/BLUE SHIELD | Admitting: Cardiovascular Disease

## 2016-12-18 ENCOUNTER — Ambulatory Visit: Payer: BLUE CROSS/BLUE SHIELD | Admitting: Nurse Practitioner

## 2016-12-18 ENCOUNTER — Encounter: Payer: Self-pay | Admitting: Cardiovascular Disease

## 2016-12-18 VITALS — BP 140/72 | HR 73 | Ht 66.0 in | Wt 179.8 lb

## 2016-12-18 DIAGNOSIS — I10 Essential (primary) hypertension: Secondary | ICD-10-CM

## 2016-12-18 DIAGNOSIS — E785 Hyperlipidemia, unspecified: Secondary | ICD-10-CM | POA: Diagnosis not present

## 2016-12-18 DIAGNOSIS — I2510 Atherosclerotic heart disease of native coronary artery without angina pectoris: Secondary | ICD-10-CM

## 2016-12-18 DIAGNOSIS — I5022 Chronic systolic (congestive) heart failure: Secondary | ICD-10-CM | POA: Diagnosis not present

## 2016-12-18 DIAGNOSIS — I359 Nonrheumatic aortic valve disorder, unspecified: Secondary | ICD-10-CM | POA: Diagnosis not present

## 2016-12-18 MED ORDER — LOSARTAN POTASSIUM 50 MG PO TABS: 50.0000 mg | ORAL_TABLET | Freq: Every day | ORAL | 3 refills | Status: DC

## 2016-12-18 NOTE — Progress Notes (Signed)
Cardiology Office Note   Date:  12/18/2016   ID:  Curtis Campos, DOB Nov 27, 1940, MRN [id number]  PCP:  Lucille Passy, MD  Cardiologist:   Kathlyn Sacramento, MD   Chief Complaint  Patient presents with  . other    6 month follow up. Meds reviewed by the pt. verbally. "doing well."  Pt. c/o shortness of breath.       History of Present Illness: Curtis Campos is a 76 y.o. male who presents for a followup visit regarding chronic systolic heart failure, coronary artery disease and mitral regurgitation.  He has known history of hypertension, previous rectal cancer, previous tobacco use and COPD.  Echocardiogram in 2015 showed an EF of 25% with moderate mitral regurgitation A right and left cardiac catheterization showed severe one-vessel coronary artery disease with a subtotal occlusion of the mid left circumflex which appeared to be chronic.  Most recent echocardiogram in October 2017 showed an EF of 40-45%, moderate aortic stenosis and moderate mitral regurgitation. He has been doing well from a cardiac standpoint with no chest pain, orthopnea or leg edema. He had recurrent episodes of bronchitis but he feels better at the present time. No side effects with medications. His blood pressure is mildly elevated.   Past Medical History:  Diagnosis Date  . Arthritis 03/12/2011  . CHF (congestive heart failure) ([place])   . Chronic obstructive lung disease ([place])   . Chronic systolic heart failure ([place]) 03/2013   EF of 25% with moderate to severe mitral regurgitation  . Coronary artery disease 03/2013   EF of 25%, moderate to severe mitral regurgitation. Catheterization showed normal cardiac output with mild pulmonary hypertension. There was 99% stenosis in the mid left circumflex which appeared to be chronic with akinetic basal inferior/posterior wall. Mild LAD and RCA disease.  Marland Kitchen Hyperlipidemia   . Hypertension   . Mitral regurgitation   . Rectal cancer ([place])   . Renal cancer [facility])      Past Surgical History:  Procedure Laterality Date  . APPENDECTOMY  06/2003  . CARDIAC CATHETERIZATION  03/2013   [facility]  . cataract surgery    . COLOSTOMY    . HERNIA REPAIR    . resection of rectum       Current Outpatient Prescriptions  Medication Sig Dispense Refill  . albuterol (PROVENTIL HFA;VENTOLIN HFA) 108 (90 Base) MCG/ACT inhaler Inhale 2 puffs into the lungs every 4 (four) hours as needed for shortness of breath. 1 Inhaler 0  . aspirin 81 MG tablet Take 81 mg by mouth daily. Takes at night     . clonazePAM (KLONOPIN) 0.5 MG tablet Take 1-3 tablets (0.5-1.5 mg total) by mouth at bedtime as needed for anxiety (or sleep). 45 tablet 1  . ezetimibe (ZETIA) 10 MG tablet TAKE 1 TABLET BY MOUTH ONCE DAILY. 90 tablet 3  . Magnesium Oxide 400 (240 Mg) MG TABS TAKE 1 TABLET BY MOUTH ONCE DAILY. 90 tablet 0  . metoprolol succinate (TOPROL-XL) 100 MG 24 hr tablet TAKE (1) TABLET BY MOUTH DAILY WITH FOOD 30 tablet 2  . niacin (NIASPAN) 1000 MG CR tablet TAKE ONE TABLET BY MOUTH AT BEDTIME. 90 tablet 3  . promethazine-dextromethorphan (PROMETHAZINE-DM) 6.25-15 MG/5ML syrup Take 5 mLs by mouth 4 (four) times daily as needed. 118 mL 0  . spironolactone (ALDACTONE) 25 MG tablet Take 1 tablet (25 mg total) by mouth daily. 30 tablet 2  . losartan (COZAAR) 50 MG tablet Take 1 tablet (50 mg  total) by mouth daily. 90 tablet 3   No current facility-administered medications for this visit.     Allergies:   Codeine and Statins    Social History:  The patient  reports that he quit smoking about 3 years ago. His smoking use included Cigarettes. He has a 50.00 pack-year smoking history. He has never used smokeless tobacco. He reports that he does not drink alcohol or use drugs.   Family History:  The patient's family history includes Cancer - Cervical in his sister; Heart attack (age of onset: 45) in his brother; Heart disease in his father, mother, sister, and sister; Hyperlipidemia in his father;  Hypertension in his father.    ROS:  Please see the history of present illness.   Otherwise, review of systems are positive for none.   All other systems are reviewed and negative.    PHYSICAL EXAM: VS:  BP 140/72 (BP Location: Left Arm, Patient Position: Sitting, Cuff Size: Normal)   Pulse 73   Ht 5\' 6"  (1.676 m)   Wt 179 lb 12 oz (81.5 kg)   BMI 29.01 kg/m  , BMI Body mass index is 29.01 kg/m. GEN: Well nourished, well developed, in no acute distress  HEENT: normal  Neck: no JVD, carotid bruits, or masses Cardiac: RRR with premature beats; rubs, or gallops,no edema . 2 /6 mid peaking crescendo decrescendo murmur in the aortic area. 2/6 holosystolic murmur at the apex. Respiratory:  clear to auscultation bilaterally, normal work of breathing GI: soft, nontender, nondistended, + BS MS: no deformity or atrophy  Skin: warm and dry, no rash Neuro:  Strength and sensation are intact Psych: euthymic mood, full affect   EKG:  EKG is ordered today. The ekg ordered today demonstrates sinus rhythm with sinus arrhythmia. No significant ST or T wave changes.   Recent Labs: 06/12/2016: ALT 12; BUN 18; Creatinine, Ser 0.90; Hemoglobin 12.9; Platelets 297.0; Potassium 4.9; Sodium 126    Lipid Panel    Component Value Date/Time   CHOL 211 (H) 06/12/2016 1247   TRIG 128.0 06/12/2016 1247   HDL 68.20 06/12/2016 1247   CHOLHDL 3 06/12/2016 1247   VLDL 25.6 06/12/2016 1247   LDLCALC 118 (H) 06/12/2016 1247   LDLDIRECT 129.7 04/14/2012 0756      Wt Readings from Last 3 Encounters:  12/18/16 179 lb 12 oz (81.5 kg)  11/19/16 184 lb 6.4 oz (83.6 kg)  11/05/16 183 lb 1.9 oz (83.1 kg)         ASSESSMENT AND PLAN:  1. Chronic systolic heart failure: The patient appears to be euvolemic without any diuretics and currently New York Heart Association class II. Most recent ejection fraction was 40-45%. Continue treatment with Toprol, spironolactone and losartan. I elected to increase  losartan to 50 mg once daily. Check basic metabolic profile in one week  2. Coronary artery disease: Currently with no anginal symptoms. Continue medical therapy.  3. Essential hypertension: Blood pressure is mildly elevated. Losartan was increased.  4. Hyperlipidemia: He is intolerant to statins and currently is on Zetia and niacin.    5. Moderate aortic stenosis and moderate mitral regurgitation. Repeat echocardiogram in 2019.    Disposition:   FU with me in 6 months  Signed,  Kathlyn Sacramento, MD  12/18/2016 2:17 PM    [facility]

## 2016-12-18 NOTE — Patient Instructions (Signed)
Medication Instructions:  Your physician has recommended you make the following change in your medication:  INCREASE losartan to 50mg  once daily    Labwork: BMET in one week at PCP  Testing/Procedures: none  Follow-Up: Your physician wants you to follow-up in: 6 months with Dr. Fletcher Anon.  You will receive a reminder letter in the mail two months in advance. If you don't receive a letter, please call our office to schedule the follow-up appointment.   Any Other Special Instructions Will Be Listed Below (If Applicable).     If you need a refill on your cardiac medications before your next appointment, please call your pharmacy.

## 2016-12-22 ENCOUNTER — Ambulatory Visit: Payer: BLUE CROSS/BLUE SHIELD

## 2016-12-25 ENCOUNTER — Other Ambulatory Visit (INDEPENDENT_AMBULATORY_CARE_PROVIDER_SITE_OTHER): Payer: BLUE CROSS/BLUE SHIELD

## 2016-12-25 DIAGNOSIS — I10 Essential (primary) hypertension: Secondary | ICD-10-CM

## 2016-12-25 LAB — BASIC METABOLIC PANEL
BUN: 14 mg/dL (ref 6–23)
CALCIUM: 9.7 mg/dL (ref 8.4–10.5)
CO2: 26 mEq/L (ref 19–32)
CREATININE: 0.85 mg/dL (ref 0.40–1.50)
Chloride: 95 mEq/L — ABNORMAL LOW (ref 96–112)
GFR: 93.1 mL/min (ref >60.00)
GLUCOSE: 102 mg/dL — AB (ref 70–99)
POTASSIUM: 4.6 meq/L (ref 3.5–5.1)
Sodium: 129 mEq/L — ABNORMAL LOW (ref 135–145)

## 2016-12-25 NOTE — Addendum Note (Signed)
Addended by: Ellamae Sia on: 12/25/2016 08:25 AM   Modules accepted: Orders

## 2017-01-06 ENCOUNTER — Other Ambulatory Visit: Payer: Self-pay | Admitting: Family Medicine

## 2017-01-06 NOTE — Telephone Encounter (Signed)
On 5.8.18 #90+3 was faxed//thx dmf

## 2017-01-08 ENCOUNTER — Other Ambulatory Visit: Payer: Self-pay | Admitting: Family Medicine

## 2017-01-16 DIAGNOSIS — Z23 Encounter for immunization: Secondary | ICD-10-CM | POA: Diagnosis not present

## 2017-01-25 ENCOUNTER — Telehealth: Payer: Self-pay | Admitting: *Deleted
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
## 2017-01-26 ENCOUNTER — Encounter: Payer: Self-pay | Admitting: Family Medicine

## 2017-01-26 ENCOUNTER — Ambulatory Visit (INDEPENDENT_AMBULATORY_CARE_PROVIDER_SITE_OTHER): Payer: BLUE CROSS/BLUE SHIELD | Admitting: Family Medicine

## 2017-01-26 ENCOUNTER — Ambulatory Visit: Payer: BLUE CROSS/BLUE SHIELD | Admitting: Family Medicine

## 2017-01-26 DIAGNOSIS — M5416 Radiculopathy, lumbar region: Secondary | ICD-10-CM

## 2017-01-26 DIAGNOSIS — F439 Reaction to severe stress, unspecified: Secondary | ICD-10-CM

## 2017-01-26 DIAGNOSIS — J209 Acute bronchitis, unspecified: Secondary | ICD-10-CM | POA: Diagnosis not present

## 2017-01-26 DIAGNOSIS — I2510 Atherosclerotic heart disease of native coronary artery without angina pectoris: Secondary | ICD-10-CM | POA: Diagnosis not present

## 2017-01-26 MED ORDER — ALBUTEROL SULFATE HFA 108 (90 BASE) MCG/ACT IN AERS: 2.0000 | INHALATION_SPRAY | RESPIRATORY_TRACT | 3 refills | Status: DC | PRN

## 2017-01-26 MED ORDER — CLONAZEPAM 0.5 MG PO TABS: 0.5000 mg | ORAL_TABLET | Freq: Every evening | ORAL | 1 refills | Status: DC | PRN
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
## 2017-01-26 NOTE — Telephone Encounter (Signed)
I am so sorry to hear about this.  Please call to check on pt.  We are booked today so what time would he like [place] to work him in?
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
## 2017-01-26 NOTE — Telephone Encounter (Signed)
Sharyn Lull CMA said as long as pt is here by 11:00 AM pt can be seen; I took the 10:45 appt but alerted front office that pt can be seen after 10:45.

## 2017-02-05 ENCOUNTER — Telehealth: Payer: Self-pay | Admitting: Family Medicine

## 2017-02-05 NOTE — Telephone Encounter (Signed)
Ok to write a note stating he can be out an additional week.  He does not need to be seen for this.

## 2017-02-05 NOTE — Telephone Encounter (Signed)
LMOVM that he can P/U updated letter on Monday for the extended week from work/thx dmf
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
## 2017-02-08 ENCOUNTER — Other Ambulatory Visit: Payer: Self-pay | Admitting: Family Medicine

## 2017-02-08 NOTE — Telephone Encounter (Signed)
On 5.8.18 #90+3 refills were faxed/thx dmf

## 2017-03-01 ENCOUNTER — Other Ambulatory Visit: Payer: Self-pay | Admitting: Cardiovascular Disease

## 2017-03-09 ENCOUNTER — Other Ambulatory Visit: Payer: Self-pay | Admitting: Family Medicine

## 2017-03-12 ENCOUNTER — Other Ambulatory Visit (HOSPITAL_BASED_OUTPATIENT_CLINIC_OR_DEPARTMENT_OTHER): Payer: BLUE CROSS/BLUE SHIELD

## 2017-03-12 ENCOUNTER — Other Ambulatory Visit: Payer: Self-pay

## 2017-03-12 ENCOUNTER — Telehealth: Payer: Self-pay

## 2017-03-12 ENCOUNTER — Telehealth: Payer: Self-pay | Admitting: Cardiovascular Disease

## 2017-03-12 ENCOUNTER — Ambulatory Visit (HOSPITAL_BASED_OUTPATIENT_CLINIC_OR_DEPARTMENT_OTHER): Payer: BLUE CROSS/BLUE SHIELD | Admitting: Hematology and Oncology

## 2017-03-12 ENCOUNTER — Ambulatory Visit: Payer: Medicare Other

## 2017-03-12 DIAGNOSIS — Z85048 Personal history of other malignant neoplasm of rectum, rectosigmoid junction, and anus: Secondary | ICD-10-CM | POA: Diagnosis not present

## 2017-03-12 DIAGNOSIS — E875 Hyperkalemia: Secondary | ICD-10-CM

## 2017-03-12 DIAGNOSIS — C20 Malignant neoplasm of rectum: Secondary | ICD-10-CM

## 2017-03-12 LAB — CBC WITH DIFFERENTIAL/PLATELET
BASO%: 1.3 % (ref 0.0–2.0)
BASOS ABS: 0.1 10*3/uL (ref 0.0–0.1)
EOS ABS: 0.4 10*3/uL (ref 0.0–0.5)
EOS%: 4.4 % (ref 0.0–7.0)
HCT: 36.9 % — ABNORMAL LOW (ref 38.4–49.9)
HEMOGLOBIN: 12.1 g/dL — AB (ref 13.0–17.1)
LYMPH%: 16.2 % (ref 14.0–49.0)
MCH: 30.4 pg (ref 27.2–33.4)
MCHC: 32.9 g/dL (ref 32.0–36.0)
MCV: 92.2 fL (ref 79.3–98.0)
MONO#: 0.9 10*3/uL (ref 0.1–0.9)
MONO%: 8.9 % (ref 0.0–14.0)
NEUT#: 6.7 10*3/uL — ABNORMAL HIGH (ref 1.5–6.5)
NEUT%: 69.2 % (ref 39.0–75.0)
Platelets: 372 10*3/uL (ref 140–400)
RBC: 4 10*6/uL — AB (ref 4.20–5.82)
RDW: 13.4 % (ref 11.0–14.6)
WBC: 9.7 10*3/uL (ref 4.0–10.3)
lymph#: 1.6 10*3/uL (ref 0.9–3.3)

## 2017-03-12 LAB — CEA (IN HOUSE-CHCC): CEA (CHCC-IN HOUSE): 2.03 ng/mL (ref 0.00–5.00)

## 2017-03-12 LAB — COMPREHENSIVE METABOLIC PANEL
ALT: 11 U/L (ref 0–55)
ANION GAP: 10 meq/L (ref 3–11)
AST: 14 U/L (ref 5–34)
Albumin: 3.9 g/dL (ref 3.5–5.0)
Alkaline Phosphatase: 76 U/L (ref 40–150)
BILIRUBIN TOTAL: 0.6 mg/dL (ref 0.20–1.20)
BUN: 20.9 mg/dL (ref 7.0–26.0)
CALCIUM: 9.6 mg/dL (ref 8.4–10.4)
CHLORIDE: 98 meq/L (ref 98–109)
CO2: 23 meq/L (ref 22–29)
CREATININE: 1 mg/dL (ref 0.7–1.3)
EGFR: >60 mL/min/{1.73_m2} (ref >60)
Glucose: 103 mg/dl (ref 70–140)
Potassium: 6.1 mEq/L (ref 3.5–5.1)
Sodium: 132 mEq/L — ABNORMAL LOW (ref 136–145)
TOTAL PROTEIN: 7.2 g/dL (ref 6.4–8.3)

## 2017-03-12 LAB — POTASSIUM (CC13)

## 2017-03-12 NOTE — Telephone Encounter (Signed)
I agree with stopping Aldactone. Monitor BP.

## 2017-03-12 NOTE — Telephone Encounter (Signed)
Patient saw oncologist, Dr Lindi Adie, today. Labs were drawn and potassium elevated at 5.4. They want direction on management of patient's BP and hyperkalemia. Patient was instructed to stop taking aldactone by NP.  Routing to Dr Fletcher Anon for further instructions for patient's BP management regarding elevated potassium

## 2017-03-12 NOTE — Assessment & Plan Note (Signed)
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  Surveillance: Annual CEAs had been normal. CEA done today is pending. CBC CMP without any major abnormalities. patient refuses colonoscopy screening because he does not want anyone to go through the ostomy since it has been working so well and he does not want anyone to mess it up.  Survivorship: I discussed with him the importance of exercise and eating more fruits and vegetables and decreasing the risk of recurrence of colon cancer.  Return to clinic in 1 year for follow-up with labs
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
## 2017-03-12 NOTE — Telephone Encounter (Signed)
[place] and let receptionist know it was ok to stop aldactone.  They asked for [place] to call patient as well.  Called patient's number and wife answered. She verbalized instructions to stop spironolactone and monitor blood pressure. She will let the patient know. She is listed on DPR.

## 2017-03-12 NOTE — Telephone Encounter (Signed)
"  [facility] calling to let Dr. Lindi Adie know we received the lab report.  Agree with the recommendation.  Aldactone will be discontinued.  Will your office check labs again?"  Patient scheduled for annual F/U about this time next year.  No further labs until F/U.  No further questions or needs.

## 2017-03-12 NOTE — Progress Notes (Signed)
Called pt to let him know his potassium level was 6.1. Dr. Lindi Adie would like for pt to come back and have potassium level re drawn today. Pt able to come at 130pm. Added lab appt and confirmed that he will be here today for repeat labs.

## 2017-03-12 NOTE — Telephone Encounter (Signed)
Dr Fletcher Anon currently in a procedure. Routing to Ignacia Bayley, NP for advice.

## 2017-03-12 NOTE — Telephone Encounter (Signed)
[facility] in [place] calling to let us know they are faxing over abnormal lab results that needs to be reviewed with patient [place] wants the nurse here to call the patient for review His potassium is too high and they asked him to stop the [place] fax [place] number : [phone]

## 2017-03-12 NOTE — Telephone Encounter (Signed)
Called pt back to let him know that his repeated potassium level came back at 5.4, which is still high. Mendel Ryder, NP reviewed results and would like to have pt heart dr notified. Pt asymptomatic at this time and was instructed to stop taking aldactone.   Spoke with Dr.Arida's office  ([facility]) to notify them of hyperkalemia. Faxed over lab results and to have their office contact pt for further instructions on medication management for hypertension and hyperkalemia.   Pt aware and awaits for further instructions from his heart dr.

## 2017-03-12 NOTE — Telephone Encounter (Deleted)
Thank you! Hopefully, they will reach out to pt for further instructions.

## 2017-03-13 ENCOUNTER — Telehealth: Payer: Self-pay | Admitting: Hematology and Oncology

## 2017-03-13 NOTE — Telephone Encounter (Signed)
Spoke with wife re December 2019 f/u.

## 2017-03-25 ENCOUNTER — Other Ambulatory Visit: Payer: Self-pay | Admitting: Cardiovascular Disease

## 2017-04-02 ENCOUNTER — Ambulatory Visit (INDEPENDENT_AMBULATORY_CARE_PROVIDER_SITE_OTHER): Payer: BLUE CROSS/BLUE SHIELD | Admitting: Primary Care

## 2017-04-02 ENCOUNTER — Encounter: Payer: Self-pay | Admitting: Primary Care

## 2017-04-02 VITALS — BP 138/82 | HR 87 | Temp 97.8°F | Ht 66.0 in | Wt 189.0 lb

## 2017-04-02 DIAGNOSIS — E785 Hyperlipidemia, unspecified: Secondary | ICD-10-CM

## 2017-04-02 DIAGNOSIS — I10 Essential (primary) hypertension: Secondary | ICD-10-CM

## 2017-04-02 DIAGNOSIS — G4700 Insomnia, unspecified: Secondary | ICD-10-CM

## 2017-04-02 DIAGNOSIS — F439 Reaction to severe stress, unspecified: Secondary | ICD-10-CM

## 2017-04-02 DIAGNOSIS — I2510 Atherosclerotic heart disease of native coronary artery without angina pectoris: Secondary | ICD-10-CM

## 2017-04-02 DIAGNOSIS — I5022 Chronic systolic (congestive) heart failure: Secondary | ICD-10-CM | POA: Diagnosis not present

## 2017-04-02 DIAGNOSIS — C20 Malignant neoplasm of rectum: Secondary | ICD-10-CM

## 2017-04-02 DIAGNOSIS — M1990 Unspecified osteoarthritis, unspecified site: Secondary | ICD-10-CM | POA: Diagnosis not present

## 2017-04-02 MED ORDER — TRAZODONE HCL 50 MG PO TABS: 50.0000 mg | ORAL_TABLET | Freq: Every evening | ORAL | 0 refills | Status: DC | PRN

## 2017-04-02 NOTE — Assessment & Plan Note (Signed)
Appears euvolemic today. Following with cardiology annually.

## 2017-04-02 NOTE — Assessment & Plan Note (Addendum)
Does have daily symptoms of anxiety and depression. Strongly discouraged daily use of clonazepam as this is not best practice for daily symptoms. Only taking clonazepam to sleep, will trial Trazodone with hopes of discontinuing benzo. He will update in 1-2 weeks.   Consider adding low dose Zoloft for continued anxiety and depression.

## 2017-04-02 NOTE — Assessment & Plan Note (Signed)
Chronic to lower back, overall manges well. Discussed to remain active and stretch daily.

## 2017-04-02 NOTE — Assessment & Plan Note (Signed)
Due for repeat lipids in March 2019. Continue Zetia. Intolerant to statins.

## 2017-04-02 NOTE — Assessment & Plan Note (Signed)
Following with oncology annually. Colostomy bag in place.

## 2017-04-02 NOTE — Assessment & Plan Note (Signed)
Using clonazepam nightly. Discussed long term risks of chronic benzo use and will attempt to wean him off. Will trial low dose Trazodone in place of Clonazepam. He will update in 1-2 weeks.

## 2017-04-02 NOTE — Assessment & Plan Note (Signed)
Stable in the office today. Continue losartan and metoprolol.
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
## 2017-04-02 NOTE — Assessment & Plan Note (Signed)
Following with cardiology annually. Continue lipid control and aspirin.

## 2017-04-02 NOTE — Patient Instructions (Signed)
Limit use of your clonazepam, try to only use this during increased bouts of anxiety.  Start Trazodone 50 mg tablets for sleep. Take 1 tablet by mouth 30 minutes prior to bedtime. May increase to two tablets if needed. Please update me in 1-2 weeks.  We may need to consider something else for daily anxiety and depression.   Please schedule a Medicare Wellness Visit with our nurse and physical with me in late March or early April 2019. We will discuss your lab results in detail during your physical.  It was a pleasure meeting you!

## 2017-04-08 ENCOUNTER — Other Ambulatory Visit: Payer: Self-pay | Admitting: Family Medicine

## 2017-04-08 DIAGNOSIS — E785 Hyperlipidemia, unspecified: Secondary | ICD-10-CM

## 2017-04-08 NOTE — Telephone Encounter (Signed)
KC-Plz see refill req/thx dmf 

## 2017-04-08 NOTE — Telephone Encounter (Signed)
Refill sent to pharmacy.   

## 2017-05-03 ENCOUNTER — Other Ambulatory Visit: Payer: Self-pay | Admitting: Cardiovascular Disease

## 2017-06-07 ENCOUNTER — Other Ambulatory Visit: Payer: Self-pay | Admitting: Primary Care

## 2017-06-07 ENCOUNTER — Other Ambulatory Visit: Payer: Self-pay | Admitting: Family Medicine

## 2017-06-07 DIAGNOSIS — E785 Hyperlipidemia, unspecified: Secondary | ICD-10-CM

## 2017-06-08 NOTE — Telephone Encounter (Signed)
Patient due for Medicare wellness visit with Katha Cabal and CPE with me this month, please schedule for either March or April.  Needs to have labs done as well.

## 2017-06-09 NOTE — Telephone Encounter (Signed)
Send a message to Ebony Hail so this can be schedule.

## 2017-06-23 ENCOUNTER — Other Ambulatory Visit: Payer: Self-pay | Admitting: Cardiovascular Disease

## 2017-06-28 ENCOUNTER — Other Ambulatory Visit: Payer: Self-pay | Admitting: Cardiovascular Disease

## 2017-07-08 ENCOUNTER — Other Ambulatory Visit: Payer: Self-pay | Admitting: Family Medicine

## 2017-07-08 DIAGNOSIS — J209 Acute bronchitis, unspecified: Secondary | ICD-10-CM

## 2017-07-08 DIAGNOSIS — J449 Chronic obstructive pulmonary disease, unspecified: Secondary | ICD-10-CM

## 2017-07-08 NOTE — Telephone Encounter (Signed)
Refill sent to pharmacy.   

## 2017-07-08 NOTE — Telephone Encounter (Signed)
CK-Plz see refill req/thx dmf

## 2017-08-05 ENCOUNTER — Other Ambulatory Visit: Payer: Self-pay | Admitting: Primary Care

## 2017-08-05 DIAGNOSIS — E785 Hyperlipidemia, unspecified: Secondary | ICD-10-CM

## 2017-08-17 ENCOUNTER — Other Ambulatory Visit: Payer: Self-pay | Admitting: Primary Care

## 2017-08-17 DIAGNOSIS — I10 Essential (primary) hypertension: Secondary | ICD-10-CM

## 2017-08-17 DIAGNOSIS — E785 Hyperlipidemia, unspecified: Secondary | ICD-10-CM

## 2017-08-19 ENCOUNTER — Other Ambulatory Visit (INDEPENDENT_AMBULATORY_CARE_PROVIDER_SITE_OTHER): Payer: BLUE CROSS/BLUE SHIELD

## 2017-08-19 ENCOUNTER — Ambulatory Visit: Payer: BLUE CROSS/BLUE SHIELD

## 2017-08-19 DIAGNOSIS — I10 Essential (primary) hypertension: Secondary | ICD-10-CM | POA: Diagnosis not present

## 2017-08-19 DIAGNOSIS — E785 Hyperlipidemia, unspecified: Secondary | ICD-10-CM | POA: Diagnosis not present

## 2017-08-19 LAB — LIPID PANEL
CHOLESTEROL: 200 mg/dL (ref 0–200)
HDL: 69.3 mg/dL (ref >39.00)
LDL CALC: 120 mg/dL — AB (ref 0–99)
NonHDL: 130.61
TRIGLYCERIDES: 55 mg/dL (ref 0.0–149.0)
Total CHOL/HDL Ratio: 3
VLDL: 11 mg/dL (ref 0.0–40.0)

## 2017-08-19 LAB — COMPREHENSIVE METABOLIC PANEL
ALT: 14 U/L (ref 0–53)
AST: 16 U/L (ref 0–37)
Albumin: 4.2 g/dL (ref 3.5–5.2)
Alkaline Phosphatase: 64 U/L (ref 39–117)
BUN: 10 mg/dL (ref 6–23)
CO2: 30 meq/L (ref 19–32)
Calcium: 9.5 mg/dL (ref 8.4–10.5)
Chloride: 100 mEq/L (ref 96–112)
Creatinine, Ser: 0.79 mg/dL (ref 0.40–1.50)
GFR: 101.13 mL/min (ref >60.00)
GLUCOSE: 109 mg/dL — AB (ref 70–99)
POTASSIUM: 4.3 meq/L (ref 3.5–5.1)
SODIUM: 136 meq/L (ref 135–145)
Total Bilirubin: 1.1 mg/dL (ref 0.2–1.2)
Total Protein: 7.1 g/dL (ref 6.0–8.3)

## 2017-08-20 ENCOUNTER — Encounter: Payer: Self-pay | Admitting: Cardiovascular Disease

## 2017-08-20 ENCOUNTER — Ambulatory Visit (INDEPENDENT_AMBULATORY_CARE_PROVIDER_SITE_OTHER): Payer: BLUE CROSS/BLUE SHIELD | Admitting: Cardiovascular Disease

## 2017-08-20 VITALS — BP 144/70 | HR 75 | Ht 66.0 in | Wt 182.0 lb

## 2017-08-20 DIAGNOSIS — I10 Essential (primary) hypertension: Secondary | ICD-10-CM

## 2017-08-20 DIAGNOSIS — E785 Hyperlipidemia, unspecified: Secondary | ICD-10-CM | POA: Diagnosis not present

## 2017-08-20 DIAGNOSIS — I359 Nonrheumatic aortic valve disorder, unspecified: Secondary | ICD-10-CM

## 2017-08-20 DIAGNOSIS — I5022 Chronic systolic (congestive) heart failure: Secondary | ICD-10-CM | POA: Diagnosis not present

## 2017-08-20 DIAGNOSIS — I2510 Atherosclerotic heart disease of native coronary artery without angina pectoris: Secondary | ICD-10-CM | POA: Diagnosis not present

## 2017-08-20 MED ORDER — LOSARTAN POTASSIUM 100 MG PO TABS: 100.0000 mg | ORAL_TABLET | Freq: Every day | ORAL | Status: DC

## 2017-08-20 MED ORDER — EVOLOCUMAB 140 MG/ML ~~LOC~~ SOAJ: 1.0000 "pen " | SUBCUTANEOUS | 12 refills | Status: DC

## 2017-08-20 NOTE — Progress Notes (Signed)
Cardiology Office Note   Date:  08/20/2017   ID:  [place], Luppino [birth year], MRN [id number]  PCP:  [name], NP  Cardiologist:   Kathlyn Sacramento, MD   Chief Complaint  Patient presents with  . Other    6 month follow up. Patient c/o sob. Meds reviewed verbally with patient.       History of Present Illness: Curtis Campos is a 77 y.o. male who presents for a followup visit regarding chronic systolic heart failure, coronary artery disease and mitral regurgitation.  He has known history of hypertension, previous rectal cancer, previous tobacco use and COPD.  Echocardiogram in 2015 showed an EF of 25% with moderate mitral regurgitation A right and left cardiac catheterization showed severe one-vessel coronary artery disease with a subtotal occlusion of the mid left circumflex which appeared to be chronic.  Most recent echocardiogram in October 2017 showed an EF of 40-45%, moderate aortic stenosis and moderate mitral regurgitation.  Spironolactone was discontinued due to hyperkalemia.  He has been doing reasonably well with no chest pain.  He reports stable exertional dyspnea with no orthopnea or significant leg edema.  He has been taking his medications regularly.  Past Medical History:  Diagnosis Date  . Arthritis 03/12/2011  . CHF (congestive heart failure) ([place])   . Chronic obstructive lung disease ([place])   . Chronic systolic heart failure ([place]) 03/2013   EF of 25% with moderate to severe mitral regurgitation  . Coronary artery disease 03/2013   EF of 25%, moderate to severe mitral regurgitation. Catheterization showed normal cardiac output with mild pulmonary hypertension. There was 99% stenosis in the mid left circumflex which appeared to be chronic with akinetic basal inferior/posterior wall. Mild LAD and RCA disease.  Marland Kitchen Hyperlipidemia   . Hypertension   . Mitral regurgitation   . Rectal cancer ([place])   . Renal cancer [facility])     Past Surgical History:    Procedure Laterality Date  . APPENDECTOMY  06/2003  . CARDIAC CATHETERIZATION  03/2013   [facility]  . cataract surgery    . COLOSTOMY    . HERNIA REPAIR    . resection of rectum       Current Outpatient Medications  Medication Sig Dispense Refill  . aspirin 81 MG tablet Take 81 mg by mouth daily. Takes at night     . ezetimibe (ZETIA) 10 MG tablet Take 1 tablet (10 mg total) by mouth daily. NEED LAB APPOINTMENT TO RECHECK LEVELS 30 tablet 0  . losartan (COZAAR) 50 MG tablet TAKE 1 TABLET BY MOUTH ONCE DAILY. 30 tablet 0  . Magnesium Oxide 400 (240 Mg) MG TABS TAKE 1 TABLET BY MOUTH ONCE DAILY. 90 tablet 3  . metoprolol succinate (TOPROL-XL) 100 MG 24 hr tablet TAKE (1) TABLET BY MOUTH DAILY WITH FOOD 30 tablet 10  . niacin (NIASPAN) 1000 MG CR tablet TAKE ONE TABLET BY MOUTH AT BEDTIME. 90 tablet 0  . PROAIR HFA 108 (90 Base) MCG/ACT inhaler INHALE 2 PUFFS BY MOUTH EVERY 4 HOURS AS NEEDED FOR SHORTNESS OF BREATH 8.5 g 0  . traZODone (DESYREL) 50 MG tablet Take 1 tablet (50 mg total) by mouth at bedtime as needed for sleep. 30 tablet 0   No current facility-administered medications for this visit.     Allergies:   Codeine and Statins    Social History:  The patient  reports that he quit smoking about 4 years ago. His smoking use included cigarettes.  He has a 50.00 pack-year smoking history. He has never used smokeless tobacco. He reports that he does not drink alcohol or use drugs.   Family History:  The patient's family history includes Cancer - Cervical in his sister; Heart attack (age of onset: 38) in his brother; Heart disease in his father, mother, sister, and sister; Hyperlipidemia in his father; Hypertension in his father.    ROS:  Please see the history of present illness.   Otherwise, review of systems are positive for none.   All other systems are reviewed and negative.    PHYSICAL EXAM: VS:  BP (!) 144/70 (BP Location: Left Arm, Patient Position: Sitting, Cuff Size: Normal)    Pulse 75   Ht 5\' 6"  (1.676 m)   Wt 182 lb (82.6 kg)   BMI 29.38 kg/m  , BMI Body mass index is 29.38 kg/m. GEN: Well nourished, well developed, in no acute distress  HEENT: normal  Neck: no JVD, carotid bruits, or masses Cardiac: RRR with premature beats; rubs, or gallops,no edema . 2/6 mid peaking crescendo decrescendo murmur in the aortic area. 2/6 holosystolic murmur at the apex. Respiratory:  clear to auscultation bilaterally, normal work of breathing GI: soft, nontender, nondistended, + BS MS: no deformity or atrophy  Skin: warm and dry, no rash Neuro:  Strength and sensation are intact Psych: euthymic mood, full affect   EKG:  EKG is ordered today. The ekg ordered today demonstrates sinus rhythm with sinus arrhythmia.  Nonspecific T wave changes.   Recent Labs: 03/12/2017: HGB 12.1; Platelets 372 08/19/2017: ALT 14; BUN 10; Creatinine, Ser 0.79; Potassium 4.3; Sodium 136    Lipid Panel    Component Value Date/Time   CHOL 200 08/19/2017 0802   TRIG 55.0 08/19/2017 0802   HDL 69.30 08/19/2017 0802   CHOLHDL 3 08/19/2017 0802   VLDL 11.0 08/19/2017 0802   LDLCALC 120 (H) 08/19/2017 0802   LDLDIRECT 129.7 04/14/2012 0756      Wt Readings from Last 3 Encounters:  08/20/17 182 lb (82.6 kg)  04/02/17 189 lb (85.7 kg)  03/12/17 181 lb (82.1 kg)         ASSESSMENT AND PLAN:  1. Chronic systolic heart failure: The patient appears to be euvolemic without any diuretics and currently New York Heart Association class II. Most recent ejection fraction was 40-45%. Continue treatment with Toprol and losartan.  Spironolactone was discontinued due to hyperkalemia.  2. Coronary artery disease: Currently with no anginal symptoms. Continue medical therapy.  3. Essential hypertension: Blood pressure continues to be elevated.  I increased losartan to 100 mg once daily.  4. Hyperlipidemia: He is intolerant to statins and currently is on Zetia and niacin.  Lipid profile was  done yesterday and showed an LDL of 120 which is above target of less than 70.  Unfortunately, he is intolerant to all statins.  I discussed different management options with him and recommend treatment with a PC SK 9 inhibitor.  This was initiated today with Repatha.  I stopped niacin.  5. Moderate aortic stenosis and moderate mitral regurgitation.  I requested a follow-up echocardiogram.   Disposition:   FU with me in 6 months  Signed,  Kathlyn Sacramento, MD  08/20/2017 1:56 PM    [place]

## 2017-08-20 NOTE — Patient Instructions (Addendum)
Medication Instructions: INCREASE your Losartan to 100 mg daily STOP the Niacin START Repatha 140 mg: 1 injection every 14 days. The office will be in touch when this has been approved through your insurance.    If you need a refill on your cardiac medications before your next appointment, please call your pharmacy.   Procedures/Testing: Your physician has requested that you have an echocardiogram. Echocardiography is a painless test that uses sound waves to create images of your heart. It provides your doctor with information about the size and shape of your heart and how well your heart's chambers and valves are working. This procedure takes approximately one hour. There are no restrictions for this procedure.  Follow-Up: Your physician wants you to follow-up in 6 months with Dr. Fletcher Anon. You will receive a reminder letter in the mail two months in advance. If you don't receive a letter, please call our office at [phone] to schedule this follow-up appointment.   Thank you for choosing [facility] at [facility]!

## 2017-08-24 ENCOUNTER — Other Ambulatory Visit: Payer: Self-pay

## 2017-08-24 ENCOUNTER — Telehealth: Payer: Self-pay | Admitting: Cardiovascular Disease

## 2017-08-24 MED ORDER — LOSARTAN POTASSIUM 100 MG PO TABS: 100.0000 mg | ORAL_TABLET | Freq: Every day | ORAL | 0 refills | Status: DC

## 2017-08-24 NOTE — Telephone Encounter (Signed)
losartan (COZAAR) 100 MG tablet 30 tablet 0 08/24/2017    Sig - Route: Take 1 tablet (100 mg total) by mouth daily. - Oral   Sent to pharmacy as: losartan (COZAAR) 100 MG tablet   E-Prescribing Status: Sent to pharmacy (08/24/2017 10:37 AM EDT)   [place], [place]

## 2017-08-24 NOTE — Telephone Encounter (Signed)
°*  STAT* If patient is at the pharmacy, call can be transferred to refill team.   1. Which medications need to be refilled? (please list name of each medication and dose if known)     Losartan 100 mg po q d   2. Which pharmacy/location (including street and city if local pharmacy) is medication to be sent to?    [place], [place]  3. Do they need a 30 day or 90 day supply? [place]

## 2017-08-25 ENCOUNTER — Other Ambulatory Visit: Payer: Self-pay | Admitting: Cardiovascular Disease

## 2017-08-25 ENCOUNTER — Telehealth: Payer: Self-pay | Admitting: Primary Care

## 2017-08-25 DIAGNOSIS — I359 Nonrheumatic aortic valve disorder, unspecified: Secondary | ICD-10-CM

## 2017-08-25 NOTE — Telephone Encounter (Signed)
Spoken and notified patient's spouse (on [place]) of Curtis Campos comments. Patient's spouse verbalized understanding.

## 2017-08-25 NOTE — Telephone Encounter (Signed)
Copied from [place] [phone]. Topic: Quick Communication - See Telephone Encounter >> Aug 25, 2017 11:01 AM Neva Seat wrote: Pt needing to know what his cholesterol level is since his Rx has been increased. Also wanting labs mailed to home address.

## 2017-08-25 NOTE — Telephone Encounter (Signed)
No improvement in cholesterol levels when compared to prior. Looks like he will be working with cardiology to start a new medication called Repatha. We will discuss his labs in greater detail during his visit in June.

## 2017-08-25 NOTE — Telephone Encounter (Signed)
Patient had these lab done on 08/19/2017 but his CPE is not until 09/10/2017. Please advise.

## 2017-08-27 ENCOUNTER — Telehealth: Payer: Self-pay | Admitting: *Deleted

## 2017-08-27 NOTE — Telephone Encounter (Signed)
Pt requiring PA for Repatha 140mg /ml soaj. PA has been submitted through covermymeds; awaiting response Curtis Campos Key: Howie Ill - PA Case ID: [id number] - Rx #: [id number]

## 2017-08-30 ENCOUNTER — Ambulatory Visit (INDEPENDENT_AMBULATORY_CARE_PROVIDER_SITE_OTHER): Payer: BLUE CROSS/BLUE SHIELD

## 2017-08-30 ENCOUNTER — Other Ambulatory Visit: Payer: Self-pay

## 2017-08-30 DIAGNOSIS — I359 Nonrheumatic aortic valve disorder, unspecified: Secondary | ICD-10-CM | POA: Diagnosis not present

## 2017-08-30 NOTE — Telephone Encounter (Signed)
Spoke with pt's pharmacy and pt's co-pay is $50 dollars.

## 2017-08-30 NOTE — Telephone Encounter (Signed)
Pt has been approved for repatha until 02/25/2018.

## 2017-09-02 ENCOUNTER — Telehealth: Payer: Self-pay | Admitting: Cardiovascular Disease

## 2017-09-02 NOTE — Telephone Encounter (Signed)
New message    Spouse calling for echo results.

## 2017-09-02 NOTE — Telephone Encounter (Signed)
Patient made aware of results and verbalized his understanding.   Notes recorded by Wellington Hampshire, MD on 09/01/2017 at 5:28 PM EDT Inform patient that echo showed stable ejection fraction and aortic stenosis.

## 2017-09-06 ENCOUNTER — Other Ambulatory Visit: Payer: Self-pay | Admitting: Primary Care

## 2017-09-06 DIAGNOSIS — E785 Hyperlipidemia, unspecified: Secondary | ICD-10-CM

## 2017-09-06 NOTE — Telephone Encounter (Signed)
Ok to refill? Electronically refill request for ezetimibe (ZETIA) 10 MG tablet  Last prescribed on 08/05/2017  Last seen on 04/02/2017. Last lipid on 08/19/2017

## 2017-09-07 NOTE — Telephone Encounter (Signed)
Refills sent to pharmacy. 

## 2017-09-10 ENCOUNTER — Encounter: Payer: Self-pay | Admitting: Primary Care

## 2017-09-10 ENCOUNTER — Ambulatory Visit (INDEPENDENT_AMBULATORY_CARE_PROVIDER_SITE_OTHER): Payer: BLUE CROSS/BLUE SHIELD | Admitting: Primary Care

## 2017-09-10 VITALS — BP 140/84 | HR 77 | Temp 98.8°F | Ht 66.0 in | Wt 182.0 lb

## 2017-09-10 DIAGNOSIS — E785 Hyperlipidemia, unspecified: Secondary | ICD-10-CM | POA: Diagnosis not present

## 2017-09-10 DIAGNOSIS — I2510 Atherosclerotic heart disease of native coronary artery without angina pectoris: Secondary | ICD-10-CM | POA: Diagnosis not present

## 2017-09-10 DIAGNOSIS — I10 Essential (primary) hypertension: Secondary | ICD-10-CM | POA: Diagnosis not present

## 2017-09-10 DIAGNOSIS — C20 Malignant neoplasm of rectum: Secondary | ICD-10-CM

## 2017-09-10 DIAGNOSIS — G4700 Insomnia, unspecified: Secondary | ICD-10-CM

## 2017-09-10 DIAGNOSIS — I429 Cardiomyopathy, unspecified: Secondary | ICD-10-CM | POA: Diagnosis not present

## 2017-09-10 DIAGNOSIS — Z0000 Encounter for general adult medical examination without abnormal findings: Secondary | ICD-10-CM | POA: Diagnosis not present

## 2017-09-10 DIAGNOSIS — F39 Unspecified mood [affective] disorder: Secondary | ICD-10-CM | POA: Diagnosis not present

## 2017-09-10 DIAGNOSIS — R7303 Prediabetes: Secondary | ICD-10-CM

## 2017-09-10 DIAGNOSIS — R739 Hyperglycemia, unspecified: Secondary | ICD-10-CM | POA: Diagnosis not present

## 2017-09-10 DIAGNOSIS — I509 Heart failure, unspecified: Secondary | ICD-10-CM

## 2017-09-10 LAB — POCT GLYCOSYLATED HEMOGLOBIN (HGB A1C): Hemoglobin A1C: 5.9 % — AB (ref 4.0–5.6)

## 2017-09-10 MED ORDER — EZETIMIBE 10 MG PO TABS
ORAL_TABLET | ORAL | 3 refills | Status: DC
Start: 2017-09-10 — End: 2018-08-29

## 2017-09-10 NOTE — Progress Notes (Signed)
Patient ID: Curtis Campos, male   DOB: [birth year], 77 y.o.   MRN: [id number]  Curtis Campos is a 77 year old male who presents today for [place].  HPI:  Past Medical History:  Diagnosis Date  . Arthritis 03/12/2011  . CHF (congestive heart failure) ([place])   . Chronic obstructive lung disease ([place])   . Chronic systolic heart failure ([place]) 03/2013   EF of 25% with moderate to severe mitral regurgitation  . Coronary artery disease 03/2013   EF of 25%, moderate to severe mitral regurgitation. Catheterization showed normal cardiac output with mild pulmonary hypertension. There was 99% stenosis in the mid left circumflex which appeared to be chronic with akinetic basal inferior/posterior wall. Mild LAD and RCA disease.  Marland Kitchen Hyperlipidemia   . Hypertension   . Mitral regurgitation   . Rectal cancer ([place])   . Renal cancer [facility])     Current Outpatient Medications  Medication Sig Dispense Refill  . aspirin 81 MG tablet Take 81 mg by mouth daily. Takes at night     . Evolocumab (REPATHA SURECLICK) 194 MG/ML SOAJ Inject 1 pen into the skin every 14 (fourteen) days. 2 pen 12  . ezetimibe (ZETIA) 10 MG tablet Take 1 tablet by mouth once daily for cholesterol. 90 tablet 1  . losartan (COZAAR) 100 MG tablet Take 1 tablet (100 mg total) by mouth daily. 30 tablet 0  . Magnesium Oxide 400 (240 Mg) MG TABS TAKE 1 TABLET BY MOUTH ONCE DAILY. 90 tablet 3  . metoprolol succinate (TOPROL-XL) 100 MG 24 hr tablet TAKE (1) TABLET BY MOUTH DAILY WITH FOOD 30 tablet 10  . PROAIR HFA 108 (90 Base) MCG/ACT inhaler INHALE 2 PUFFS BY MOUTH EVERY 4 HOURS AS NEEDED FOR SHORTNESS OF BREATH 8.5 g 0  . traZODone (DESYREL) 50 MG tablet Take 1 tablet (50 mg total) by mouth at bedtime as needed for sleep. 30 tablet 0   No current facility-administered medications for this visit.     Allergies  Allergen Reactions  . Codeine Nausea Only  . Statins Other (See Comments)    REACTION: maylgias    Family History  Problem  Relation Age of Onset  . Heart disease Mother   . Heart disease Father   . Hyperlipidemia Father   . Hypertension Father   . Heart attack Brother 70       MI  . Heart disease Sister        stents placed   . Cancer - Cervical Sister   . Heart disease Sister        CABG    Social History   Socioeconomic History  . Marital status: Married    Spouse name: Not on file  . Number of children: Not on file  . Years of education: Not on file  . Highest education level: Not on file  Occupational History  . Not on file  Social Needs  . Financial resource strain: Not on file  . Food insecurity:    Worry: Not on file    Inability: Not on file  . Transportation needs:    Medical: Not on file    Non-medical: Not on file  Tobacco Use  . Smoking status: Former Smoker    Packs/day: 1.00    Years: 50.00    Pack years: 50.00    Types: Cigarettes    Last attempt to quit: 03/25/2013    Years since quitting: 4.4  . Smokeless tobacco: Never Used  Substance and  Sexual Activity  . Alcohol use: No    Alcohol/week: 0.0 oz  . Drug use: No  . Sexual activity: Yes  Lifestyle  . Physical activity:    Days per week: Not on file    Minutes per session: Not on file  . Stress: Not on file  Relationships  . Social connections:    Talks on phone: Not on file    Gets together: Not on file    Attends religious service: Not on file    Active member of club or organization: Not on file    Attends meetings of clubs or organizations: Not on file    Relationship status: Not on file  . Intimate partner violence:    Fear of current or ex partner: Not on file    Emotionally abused: Not on file    Physically abused: Not on file    Forced sexual activity: Not on file  Other Topics Concern  . Not on file  Social History Narrative  . Not on file    Hospitiliaztions: None  Health Maintenance:    Flu: Did receive last season  Pneumovax: Completed in 2008  Prevnar: Completed in 2015  Zostavax:  Completed in 2015  Colonoscopy: Completed in 2012  Eye Doctor: Completed in 2018  Dental Exam: No recent exam  PSA: Completed in 2018    Providers: Alma Friendly, PCP; Dr. Fletcher Anon, Cardiology   I have personally reviewed and have noted: 1. The patient's medical and social history 2. Their use of alcohol, tobacco or illicit drugs 3. Their current medications and supplements 4. The patient's functional ability including ADL's, fall risks, home safety risks and  hearing or visual impairment. 5. Diet and physical activities 6. Evidence for depression or mood disorder  Subjective:   Review of Systems:   Constitutional: Denies fever, malaise, fatigue, headache or abrupt weight changes.  HEENT: Denies eye pain, eye redness, ear pain, ringing in the ears, wax buildup, runny nose, nasal congestion, bloody nose, or sore throat. Respiratory: Denies difficulty breathing, shortness of breath, cough or sputum production.   Cardiovascular: Denies chest pain, chest tightness, palpitations or swelling in the hands or feet.  Gastrointestinal: Denies abdominal pain, bloating, constipation, diarrhea or blood in the stool.  GU: Denies urgency, frequency, pain with urination, burning sensation, blood in urine, odor or discharge. Some difficulty getting urinary stream started, overall not bothersome.  Musculoskeletal: Denies decrease in range of motion, difficulty with gait.  Skin: Denies redness, rashes, lesions or ulcercations.  Neurological: Denies dizziness, difficulty with memory, difficulty with speech or problems with balance and coordination.  Psychiatric: Denies concerns for anxiety or depression.   No other specific complaints in a complete review of systems (except as listed in HPI above).  Objective:  PE:   BP 140/84   Pulse 77   Temp 98.8 F (37.1 C) (Oral)   Ht 5\' 6"  (1.676 m)   Wt 182 lb (82.6 kg)   SpO2 97%   BMI 29.38 kg/m  Wt Readings from Last 3 Encounters:  09/10/17 182  lb (82.6 kg)  08/20/17 182 lb (82.6 kg)  04/02/17 189 lb (85.7 kg)    General: Appears their stated age, well developed, well nourished in NAD. Skin: Warm, dry and intact. No rashes, lesions or ulcerations noted. HEENT: Head: normal shape and size; Eyes: sclera white, no icterus, conjunctiva pink, PERRLA and EOMs intact; Ears: Tm's gray and intact, normal light reflex; Nose: mucosa pink and moist, septum midline; Throat/Mouth: Teeth present,  mucosa pink and moist, no exudate, lesions or ulcerations noted.  Neck: Normal range of motion. Neck supple, trachea midline. No massses, lumps or thyromegaly present.  Cardiovascular: Normal rate and rhythm. S1,S2 noted.   Pulmonary/Chest: Normal effort and positive vesicular breath sounds. No respiratory distress. No wheezes, rales or ronchi noted.  Abdomen: Soft and nontender. Normal bowel sounds, no bruits noted. Colostomy bag in place to left lower abdomen. Musculoskeletal: Normal range of motion. No signs of joint swelling. No difficulty with gait.  Neurological: Alert and oriented. Cranial nerves II-XII intact. Coordination normal. +DTRs bilaterally. Psychiatric: Mood and affect normal. Behavior is normal. Judgment and thought content normal.   BMET    Component Value Date/Time   NA 136 08/19/2017 0802   NA 132 (L) 03/12/2017 1100   K 4.3 08/19/2017 0802   K 5.4 No visable hemolysis (H) 03/12/2017 1354   CL 100 08/19/2017 0802   CL 94 (L) 04/13/2013 0421   CL 101 03/11/2012 1404   CO2 30 08/19/2017 0802   CO2 23 03/12/2017 1100   GLUCOSE 109 (H) 08/19/2017 0802   GLUCOSE 103 03/12/2017 1100   GLUCOSE 107 (H) 03/11/2012 1404   BUN 10 08/19/2017 0802   BUN 20.9 03/12/2017 1100   CREATININE 0.79 08/19/2017 0802   CREATININE 1.0 03/12/2017 1100   CALCIUM 9.5 08/19/2017 0802   CALCIUM 9.6 03/12/2017 1100   GFRNONAA 90 11/20/2014 1430   GFRNONAA >60 04/13/2013 0421   GFRAA 104 11/20/2014 1430   GFRAA >60 04/13/2013 0421    Lipid Panel      Component Value Date/Time   CHOL 200 08/19/2017 0802   TRIG 55.0 08/19/2017 0802   HDL 69.30 08/19/2017 0802   CHOLHDL 3 08/19/2017 0802   VLDL 11.0 08/19/2017 0802   LDLCALC 120 (H) 08/19/2017 0802    CBC    Component Value Date/Time   WBC 9.7 03/12/2017 1100   WBC 9.0 06/12/2016 1247   RBC 4.00 (L) 03/12/2017 1100   RBC 4.19 (L) 06/12/2016 1247   HGB 12.1 (L) 03/12/2017 1100   HCT 36.9 (L) 03/12/2017 1100   PLT 372 03/12/2017 1100   MCV 92.2 03/12/2017 1100   MCH 30.4 03/12/2017 1100   MCH 31.7 03/01/2009 1331   MCHC 32.9 03/12/2017 1100   MCHC 33.2 06/12/2016 1247   RDW 13.4 03/12/2017 1100   LYMPHSABS 1.6 03/12/2017 1100   MONOABS 0.9 03/12/2017 1100   EOSABS 0.4 03/12/2017 1100   EOSABS 0.0 04/08/2013 0559   EOSABS 0.2 03/01/2009 1331   BASOSABS 0.1 03/12/2017 1100    Hgb A1C No results found for: HGBA1C    Assessment and Plan:   Medicare Annual Wellness Visit:  Diet: He endorses a fair diet. Breakfast: Toast Lunch: Crackers Dinner: Vegetables, potatoes with gravy, meat Snacks: Chips, cookies Desserts: Daily  Beverages: Water, coffee, Coke, sweet tea Physical activity: Active at work, no regular exercise Depression/mood screen: Negative Hearing: Intact to whispered voice Visual acuity: Grossly normal, performs annual eye exam  ADLs: Capable Fall risk: None Home safety: Good Cognitive evaluation: Intact to orientation, naming, recall and repetition EOL planning: Adv directives not completed, full code/ I agree  Preventative Medicine: Immunizations UTD. Colonoscopy UTD. PSA UTD. Recommended to work on diet and get regular exercise. Advance directives packet provided. Exam unremarkable. Labs with hyperglycemia - check POC A1C today. Continue cardiology follow up. All recommendations provided at end of visit.  Next appointment: One year for annual exam.

## 2017-09-10 NOTE — Assessment & Plan Note (Signed)
Doing well on Trazodone, taking 1/2 tablet nightly. Continue same.

## 2017-09-10 NOTE — Assessment & Plan Note (Signed)
Following with cardiology. Continue Repatha, Zetia, aspirin, BP control.

## 2017-09-10 NOTE — Patient Instructions (Signed)
Your blood sugars are in the prediabetic range. Be sure to limit chips, crackers, sweets, sugary drinks.  Start exercising. You should be getting 150 minutes of exercise weekly.  Increase vegetables, fruit, whole grains, lean protein.  Ensure you are consuming 64 ounces of water daily.  Take a look at the advanced directives packet.   Follow up in 1 year for your annual exam or sooner if needed.  It was a pleasure to see you today!

## 2017-09-10 NOTE — Assessment & Plan Note (Signed)
Immunizations UTD. Colonoscopy UTD. PSA UTD. Recommended to work on diet and get regular exercise. Advance directives packet provided. Exam unremarkable. Labs with hyperglycemia - check POC A1C today. Continue cardiology follow up. All recommendations provided at end of visit.  I have personally reviewed and have noted: 1. The patient's medical and social history 2. Their use of alcohol, tobacco or illicit drugs 3. Their current medications and supplements 4. The patient's functional ability including ADL's, fall risks, home safety risks and  hearing or visual impairment. 5. Diet and physical activities 6. Evidence for depression or mood disorder

## 2017-09-10 NOTE — Assessment & Plan Note (Signed)
Stable in the office today, continue current regimen. 

## 2017-09-10 NOTE — Assessment & Plan Note (Signed)
Based off of prior labs it appears he's had hyperglycemia for quite some time. POC A1C of 5.9 today. Discussed to work on reduction of sweets, processed carbs, sugary drinks. Continue to monitor.

## 2017-09-10 NOTE — Assessment & Plan Note (Signed)
Denies concerns for anxiety and depression

## 2017-09-10 NOTE — Assessment & Plan Note (Signed)
Doing well, no problems with colostomy bag.

## 2017-09-10 NOTE — Assessment & Plan Note (Signed)
Appears euvolemic. Following with cardiology.

## 2017-09-22 ENCOUNTER — Other Ambulatory Visit: Payer: Self-pay | Admitting: Cardiovascular Disease

## 2017-09-22 ENCOUNTER — Other Ambulatory Visit: Payer: Self-pay | Admitting: Primary Care

## 2017-09-22 DIAGNOSIS — G4700 Insomnia, unspecified: Secondary | ICD-10-CM

## 2017-11-08 ENCOUNTER — Other Ambulatory Visit: Payer: Self-pay | Admitting: Primary Care

## 2017-11-08 DIAGNOSIS — G4700 Insomnia, unspecified: Secondary | ICD-10-CM

## 2017-11-24 ENCOUNTER — Other Ambulatory Visit: Payer: Self-pay | Admitting: Primary Care

## 2017-11-24 DIAGNOSIS — J449 Chronic obstructive pulmonary disease, unspecified: Secondary | ICD-10-CM

## 2018-01-03 ENCOUNTER — Other Ambulatory Visit: Payer: Self-pay | Admitting: Primary Care

## 2018-01-03 DIAGNOSIS — E785 Hyperlipidemia, unspecified: Secondary | ICD-10-CM

## 2018-01-12 ENCOUNTER — Ambulatory Visit (INDEPENDENT_AMBULATORY_CARE_PROVIDER_SITE_OTHER): Payer: BLUE CROSS/BLUE SHIELD | Admitting: Family Medicine

## 2018-01-12 ENCOUNTER — Ambulatory Visit (INDEPENDENT_AMBULATORY_CARE_PROVIDER_SITE_OTHER)
Admission: RE | Admit: 2018-01-12 | Discharge: 2018-01-12 | Disposition: A | Discharge status: Home / Self Care | Payer: BLUE CROSS/BLUE SHIELD | Source: Ambulatory Visit | Attending: Family Medicine | Admitting: Family Medicine

## 2018-01-12 ENCOUNTER — Encounter: Payer: Self-pay | Admitting: Family Medicine

## 2018-01-12 VITALS — BP 140/70 | HR 89 | Temp 97.8°F | Ht 66.0 in | Wt 178.8 lb

## 2018-01-12 DIAGNOSIS — I2510 Atherosclerotic heart disease of native coronary artery without angina pectoris: Secondary | ICD-10-CM | POA: Diagnosis not present

## 2018-01-12 DIAGNOSIS — Z23 Encounter for immunization: Secondary | ICD-10-CM

## 2018-01-12 DIAGNOSIS — M1711 Unilateral primary osteoarthritis, right knee: Secondary | ICD-10-CM

## 2018-01-12 DIAGNOSIS — M25552 Pain in left hip: Secondary | ICD-10-CM

## 2018-01-12 DIAGNOSIS — M7062 Trochanteric bursitis, left hip: Secondary | ICD-10-CM | POA: Diagnosis not present

## 2018-01-12 MED ORDER — METHYLPREDNISOLONE ACETATE 40 MG/ML IJ SUSP
80.0000 mg | Freq: Once | INTRAMUSCULAR | Status: AC
  Administered 2018-01-12: 80 mg via INTRA_ARTICULAR

## 2018-01-12 NOTE — Progress Notes (Signed)
Dr. Frederico Hamman T. Nathasha Fiorillo, MD, [facility] [place], [address] Phone: [phone] Fax: [phone]  01/12/2018  Patient: Curtis Campos, MRN: [id number], DOB: [birth year], 77 y.o.  Primary Physician:  [name], NP   Chief Complaint  Patient presents with  . Hip Pain    Left  . Knee Pain    Right   Subjective:   Curtis Campos is a 77 y.o. very pleasant male patient who presents with the following:  Very pleasant gentleman who I seen before, he presents with right-sided knee pain, this is chronic in nature and he has known osteoarthritis.  He has a significant effusion right now and pain in the knee.  He has had some injections of corticosteroid in the past which provided him some prolonged relief of symptoms.  He also has some left-sided hip pain, he describes this more on the lateral aspect of the hip.  He does have some restriction of motion in his hip as well.  He is still working  L GTB  R knee asp  Past Medical History, Surgical History, Social History, Family History, Problem List, Medications, and Allergies have been reviewed and updated if relevant.  Patient Active Problem List   Diagnosis Date Noted  . Prediabetes 09/10/2017  . Trauma and stressor-related disorder 01/26/2017  . Medicare annual wellness visit, subsequent 06/23/2016  . Chronic hyponatremia 06/23/2016  . Mitral regurgitation   . CHF with cardiomyopathy ([place]) 04/21/2013  . Coronary artery disease 03/30/2013  . Chronic systolic heart failure ([place]) 03/30/2013  . Insomnia 03/29/2013  . Multifocal atrial tachycardia ([place]) 01/17/2013  . Arthritis 03/12/2011  . Episodic mood disorder ([place]) 04/06/2008  . Essential hypertension 03/06/2008  . Nicotine dependence 03/10/2007  . HLD (hyperlipidemia) 03/18/2006  . Malignant neoplasm of rectum ([place]) 03/31/2003    Past Medical History:  Diagnosis Date  . Arthritis 03/12/2011  .  CHF (congestive heart failure) ([place])   . Chronic obstructive lung disease ([place])   . Chronic systolic heart failure ([place]) 03/2013   EF of 25% with moderate to severe mitral regurgitation  . Coronary artery disease 03/2013   EF of 25%, moderate to severe mitral regurgitation. Catheterization showed normal cardiac output with mild pulmonary hypertension. There was 99% stenosis in the mid left circumflex which appeared to be chronic with akinetic basal inferior/posterior wall. Mild LAD and RCA disease.  Marland Kitchen Hyperlipidemia   . Hypertension   . Mitral regurgitation   . Rectal cancer ([place])   . Renal cancer [facility])     Past Surgical History:  Procedure Laterality Date  . APPENDECTOMY  06/2003  . CARDIAC CATHETERIZATION  03/2013   [facility]  . cataract surgery    . COLOSTOMY    . HERNIA REPAIR    . resection of rectum      Social History   Socioeconomic History  . Marital status: Married    Spouse name: Not on file  . Number of children: Not on file  . Years of education: Not on file  . Highest education level: Not on file  Occupational History  . Not on file  Social Needs  . Financial resource strain: Not on file  . Food insecurity:    Worry: Not on file    Inability: Not on file  . Transportation needs:    Medical: Not on file    Non-medical: Not on file  Tobacco Use  . Smoking status: Former Smoker  Packs/day: 1.00    Years: 50.00    Pack years: 50.00    Types: Cigarettes    Last attempt to quit: 03/25/2013    Years since quitting: 4.8  . Smokeless tobacco: Never Used  Substance and Sexual Activity  . Alcohol use: No    Alcohol/week: 0.0 standard drinks  . Drug use: No  . Sexual activity: Yes  Lifestyle  . Physical activity:    Days per week: Not on file    Minutes per session: Not on file  . Stress: Not on file  Relationships  . Social connections:    Talks on phone: Not on file    Gets together: Not on file    Attends religious service: Not on file    Active  member of club or organization: Not on file    Attends meetings of clubs or organizations: Not on file    Relationship status: Not on file  . Intimate partner violence:    Fear of current or ex partner: Not on file    Emotionally abused: Not on file    Physically abused: Not on file    Forced sexual activity: Not on file  Other Topics Concern  . Not on file  Social History Narrative  . Not on file    Family History  Problem Relation Age of Onset  . Heart disease Mother   . Heart disease Father   . Hyperlipidemia Father   . Hypertension Father   . Heart attack Brother 57       MI  . Heart disease Sister        stents placed   . Cancer - Cervical Sister   . Heart disease Sister        CABG    Allergies  Allergen Reactions  . Codeine Nausea Only  . Statins Other (See Comments)    REACTION: maylgias    Medication list reviewed and updated in full in [place].  GEN: No fevers, chills. Nontoxic. Primarily MSK c/o today. MSK: Detailed in the HPI GI: tolerating PO intake without difficulty Neuro: No numbness, parasthesias, or tingling associated. Otherwise the pertinent positives of the ROS are noted above.   Objective:   BP 140/70   Pulse 89   Temp 97.8 F (36.6 C) (Oral)   Ht 5\' 6"  (1.676 m)   Wt 178 lb 12 oz (81.1 kg)   BMI 28.85 kg/m    GEN: WDWN, NAD, Non-toxic, Alert & Oriented x 3 HEENT: Atraumatic, Normocephalic.  Ears and Nose: No external deformity. EXTR: No clubbing/cyanosis/edema NEURO: Normal gait.  PSYCH: Normally interactive. Conversant. Not depressed or anxious appearing.  Calm demeanor.   HIP EXAM: SIDE: L ROM: Abduction, Flexion, Internal and External range of motion: There is an approximate 25 degree loss of abduction.  The patient is only able to internally and externally rotate his hip approximately 20 degrees with the hip flexed to 90. Pain with terminal IROM and EROM: Minimal GTB: TTP SLR: NEG Knees: No effusion FABER:  NT REVERSE FABER: NT, neg Piriformis: NT at direct palpation Str: flexion: 5/5 abduction: 5/5 adduction: 5/5 Strength testing non-tender  Right knee lacks full degrees of extension.  Flexion to 118 degrees.  There is minimal joint line tenderness.  He does have a moderate to large effusion.  This is ballotable in character.  Stable to varus and valgus stress.  Anterior drawer and posterior drawer testing is negative.  Lockman testing is negative.  Radiology: Dg Knee  4 Views W/patella Right  Result Date: 01/12/2018 CLINICAL DATA:  Right knee pain.  Suspect osteoarthritis. EXAM: RIGHT KNEE - COMPLETE 4+ VIEW COMPARISON:  None. FINDINGS: The bones are subjectively adequately mineralized. There is high-grade narrowing of the medial joint space with a near bone on bone appearance and some eburnation of the subarticular bone of the medial femoral condyle. The lateral compartment it appears reasonably well maintained. The patellofemoral joint space is unremarkable. Spurs arise from the articular margins of the patella. There are rounded calcifications which likely reflects synovial chondromatosis adjacent to the medial joint space. There are arterial calcifications. IMPRESSION: Severe osteoarthritic change centered on the medial compartment with a near bone on bone appearance. No acute fracture or dislocation. Milder osteoarthritic changes of the patellofemoral compartment. Electronically Signed   By: David  Martinique M.D.   On: 01/12/2018 15:32   Dg Hip Unilat With Pelvis 2-3 Views Left  Result Date: 01/12/2018 CLINICAL DATA:  Left lateral hip pain.  Questionable osteoarthritis. EXAM: DG HIP (WITH OR WITHOUT PELVIS) 2-3V LEFT COMPARISON:  Coronal and sagittal reconstructed images through the pelvis and hips from an abdominal and pelvic CT scan of June 30, 2007 FINDINGS: The bony pelvis is subjectively adequately mineralized for age. There is no lytic nor blastic lesion. AP and lateral views of the left hip  reveal reasonable preservation of the joint space. The articular surfaces of the left femoral head and acetabulum remains smoothly rounded. The femoral neck, intertrochanteric, and subtrochanteric regions are normal. IMPRESSION: There is no acute or significant chronic bony abnormality of the left hip. Electronically Signed   By: David  Martinique M.D.   On: 01/12/2018 15:31     Assessment and Plan:   Left hip pain - Plan: DG HIP UNILAT WITH PELVIS 2-3 VIEWS LEFT, methylPREDNISolone acetate (DEPO-MEDROL) injection 80 mg  Need for prophylactic vaccination and inoculation against influenza - Plan: Flu Vaccine QUAD 6+ mos PF IM (Fluarix Quad PF)  Primary osteoarthritis of right knee - Plan: DG Knee 4 Views W/Patella Right, methylPREDNISolone acetate (DEPO-MEDROL) injection 80 mg  Severe medial compartmental OA with flare  Troch bursitis with weak hips  Trochanteric Bursitis Injection, L Date of procedure: 01/12/2018 Verbal consent obtained. Risks (including infection, potential atrophy), benefits, and alternatives reviewed. Greater trochanter sterilely prepped with Chloraprep. Ethyl Chloride used for anesthesia. 8 cc of Lidocaine 1% injected with 2 mL of Depo-Medrol 40 mg into trochanteric bursa at area of maximal tenderness at greater trochanter. Needle taken to bone to troch bursa, flows easily. Bursa massaged. No bleeding and no complications. Decreased pain after injection. Needle: 22 gauge spinal needle Medication: 2 mL of Depo-Medrol 40 mg, equaling Depo-Medrol 80 mg total   Knee Aspiration and Injection, R Date of procedure: 01/12/2018 Patient verbally consented; risks, benefits, and alternatives explained including possible infection. Patient prepped with Chloraprep. Ethyl chloride for anesthesia. 10 cc of 1% Lidocaine used in wheal then injected Subcutaneous fashion with 22 gauge needle on lateral approach. Under sterilne conditions, 18 gauge needle used via lateral approach to aspirate 25  cc of serosanguinous fluid. Then 8 cc of Lidocaine 1% and 2 mL of Depo-Medrol 40 mg injected. Tolerated well, decreased pain, no complications. Medication: 2 mL of Depo-Medrol 40 mg, equaling Depo-Medrol 80 mg total   Follow-up: prn  Meds ordered this encounter  Medications  . methylPREDNISolone acetate (DEPO-MEDROL) injection 80 mg  . methylPREDNISolone acetate (DEPO-MEDROL) injection 80 mg   Orders Placed This Encounter  Procedures  . DG Knee 4 Views  W/Patella Right  . DG HIP UNILAT WITH PELVIS 2-3 VIEWS LEFT  . Flu Vaccine QUAD 6+ mos PF IM (Fluarix Quad PF)    Signed,  Bharath Bernstein T. Brinklee Cisse, MD   Allergies as of 01/12/2018      Reactions   Codeine Nausea Only   Statins Other (See Comments)   REACTION: maylgias      Medication List        Accurate as of 01/12/18 11:59 PM. Always use your most recent med list.          aspirin 81 MG tablet Take 81 mg by mouth daily. Takes at night   Evolocumab 140 MG/ML Soaj Inject 1 pen into the skin every 14 (fourteen) days.   ezetimibe 10 MG tablet Commonly known as:  ZETIA Take 1 tablet by mouth once daily for cholesterol.   losartan 100 MG tablet Commonly known as:  COZAAR TAKE 1 TABLET BY MOUTH ONCE DAILY.   Magnesium Oxide 400 (240 Mg) MG Tabs TAKE 1 TABLET BY MOUTH ONCE DAILY.   metoprolol succinate 100 MG 24 hr tablet Commonly known as:  TOPROL-XL TAKE (1) TABLET BY MOUTH DAILY WITH FOOD   PROAIR HFA 108 (90 Base) MCG/ACT inhaler Generic drug:  albuterol INHALE 2 PUFFS BY MOUTH EVERY 4 HOURS AS NEEDED FOR SHORTNESS OF BREATH   traZODone 50 MG tablet Commonly known as:  DESYREL TAKE 1 TABLET BY MOUTH AT BEDTIME AS NEEDED FOR SLEEP

## 2018-01-12 NOTE — Patient Instructions (Signed)
Hip Rehab:  Hip Flexion: Toe up to ceiling, laying on your back. Lift your whole leg, 3 sets. Work up to being able to do #30 with each set.  Hip elevations, Toe and leg turned out to side.  Lift whole leg, 3 sets. Work up to being able to do #30 with each set.  Hip Abductions: Lying on side, straight out to side. 3 sets, work up to being able to do #30 with each set.  At the beginning you may only be able to do a lot less, try to do #10.  

## 2018-01-31 ENCOUNTER — Other Ambulatory Visit: Payer: Self-pay | Admitting: Cardiovascular Disease

## 2018-01-31 NOTE — Telephone Encounter (Signed)
Please advise if ok to refill Magnesium.

## 2018-02-14 ENCOUNTER — Telehealth: Payer: Self-pay

## 2018-02-14 ENCOUNTER — Telehealth: Payer: Self-pay | Admitting: Hematology and Oncology

## 2018-02-14 NOTE — Telephone Encounter (Signed)
[facility] PAL 12/13 moved f/u to 12/31. Spoke with patient wife.

## 2018-02-14 NOTE — Telephone Encounter (Signed)
Message from Plan: Request Reference Number: [id number].  Repatha Inj 140mg /ml is approved through 02/15/2019.  Please refer to the fax or electronic case notice for further information

## 2018-02-14 NOTE — Telephone Encounter (Signed)
Awaiting Response  Curtis Campos (Key: [id number]) Repatha 140MG /ML syringes Please wait for [facility] to return a determination.

## 2018-02-14 NOTE — Telephone Encounter (Signed)
Approval Placed in [employer] in [employer].

## 2018-03-04 ENCOUNTER — Telehealth: Payer: Self-pay | Admitting: *Deleted

## 2018-03-04 DIAGNOSIS — E785 Hyperlipidemia, unspecified: Secondary | ICD-10-CM

## 2018-03-04 NOTE — Telephone Encounter (Signed)
Call placed to the patient who was unavailable. Spoke with his wife, per the dpr. She has been informed that the patient is due for repeat fasting lipids/liver. He will come next week to the [facility] medical mall.

## 2018-03-07 DIAGNOSIS — H524 Presbyopia: Secondary | ICD-10-CM | POA: Diagnosis not present

## 2018-03-07 DIAGNOSIS — H35033 Hypertensive retinopathy, bilateral: Secondary | ICD-10-CM | POA: Diagnosis not present

## 2018-03-07 DIAGNOSIS — H2513 Age-related nuclear cataract, bilateral: Secondary | ICD-10-CM | POA: Diagnosis not present

## 2018-03-08 ENCOUNTER — Other Ambulatory Visit
Admission: RE | Admit: 2018-03-08 | Discharge: 2018-03-08 | Disposition: A | Discharge status: Home / Self Care | Payer: BLUE CROSS/BLUE SHIELD | Source: Ambulatory Visit | Attending: Cardiovascular Disease | Admitting: Cardiovascular Disease

## 2018-03-08 DIAGNOSIS — E785 Hyperlipidemia, unspecified: Secondary | ICD-10-CM | POA: Diagnosis not present

## 2018-03-08 LAB — LIPID PANEL
Cholesterol: 176 mg/dL (ref 0–200)
HDL: 70 mg/dL (ref >40)
LDL Cholesterol: 78 mg/dL (ref 0–99)
Total CHOL/HDL Ratio: 2.5 RATIO
Triglycerides: 141 mg/dL (ref <150)
VLDL: 28 mg/dL (ref 0–40)

## 2018-03-08 LAB — HEPATIC FUNCTION PANEL
ALBUMIN: 3.9 g/dL (ref 3.5–5.0)
ALT: 15 U/L (ref 0–44)
AST: 19 U/L (ref 15–41)
Alkaline Phosphatase: 52 U/L (ref 38–126)
Bilirubin, Direct: 0.1 mg/dL (ref 0.0–0.2)
Indirect Bilirubin: 0.9 mg/dL (ref 0.3–0.9)
Total Bilirubin: 1 mg/dL (ref 0.3–1.2)
Total Protein: 7 g/dL (ref 6.5–8.1)

## 2018-03-11 ENCOUNTER — Telehealth: Payer: Self-pay | Admitting: *Deleted

## 2018-03-11 ENCOUNTER — Ambulatory Visit: Payer: BLUE CROSS/BLUE SHIELD | Admitting: Hematology and Oncology

## 2018-03-11 NOTE — Telephone Encounter (Signed)
Patient made aware of results and verbalized understanding.  Notes recorded by Wellington Hampshire, MD on 03/10/2018 at 3:41 PM EST Inform patient that labs were normal. Cholesterol improved with treatment. Continue same medications

## 2018-03-17 NOTE — Progress Notes (Signed)
Patient Care Team: [name], NP as PCP - General (Internal Medicine) Nicholas Lose, MD as Consulting Physician (Hematology and Oncology) Wellington Hampshire, MD as Consulting Physician (Cardiology)  DIAGNOSIS:    ICD-10-CM   1. Malignant neoplasm of rectum ([place]) C20 CEA (IN [facility])    CEA ([facility])    CHIEF COMPLIANT: Surveillance of rectal cancer  INTERVAL HISTORY: Curtis Campos is a 77 y.o. with above-mentioned history of rectal cancer and a colostomy. I last saw the patient one year ago. His most recent ECHO from 08/30/17 shows an ejection fraction in the range of 45-50%. He presents to the clinic today alone and is doing well. He denies issues with his stoma or his rectum. He notes high blood pressure and mild swelling in his feet.   REVIEW OF SYSTEMS:   Constitutional: Denies fevers, chills or abnormal weight loss Eyes: Denies blurriness of vision Ears, nose, mouth, throat, and face: Denies mucositis or sore throat Respiratory: Denies cough, dyspnea or wheezes Cardiovascular: Denies palpitation, chest discomfort Gastrointestinal:  Denies nausea, heartburn or change in bowel habits Skin: Denies abnormal skin rashes Lymphatics: Denies new lymphadenopathy or easy bruising Neurological:Denies numbness, tingling or new weaknesses Behavioral/Psych: Mood is stable, no new changes  Extremities: (+) swelling in feet All other systems were reviewed with the patient and are negative.  I have reviewed the past medical history, past surgical history, social history and family history with the patient and they are unchanged from previous note.  ALLERGIES:  is allergic to codeine and statins.  MEDICATIONS:  Current Outpatient Medications  Medication Sig Dispense Refill  . aspirin 81 MG tablet Take 81 mg by mouth daily. Takes at night     . Evolocumab (REPATHA SURECLICK) 299 MG/ML SOAJ Inject 1 pen into the skin every 14 (fourteen) days. 2 pen 12  . ezetimibe (ZETIA)  10 MG tablet Take 1 tablet by mouth once daily for cholesterol. 90 tablet 3  . losartan (COZAAR) 100 MG tablet TAKE 1 TABLET BY MOUTH ONCE DAILY. 30 tablet 0  . magnesium oxide (MAG-OX) 400 (241.3 Mg) MG tablet TAKE 1 TABLET BY MOUTH ONCE DAILY. 30 tablet 0  . Magnesium Oxide 400 (240 Mg) MG TABS TAKE 1 TABLET BY MOUTH ONCE DAILY. 90 tablet 3  . metoprolol succinate (TOPROL-XL) 100 MG 24 hr tablet TAKE (1) TABLET BY MOUTH DAILY WITH FOOD 30 tablet 10  . PROAIR HFA 108 (90 Base) MCG/ACT inhaler INHALE 2 PUFFS BY MOUTH EVERY 4 HOURS AS NEEDED FOR SHORTNESS OF BREATH 8.5 g 0  . traZODone (DESYREL) 50 MG tablet TAKE 1 TABLET BY MOUTH AT BEDTIME AS NEEDED FOR SLEEP 30 tablet 2   No current facility-administered medications for this visit.     PHYSICAL EXAMINATION: ECOG PERFORMANCE STATUS: 0 - Asymptomatic  Vitals:   03/29/18 1308  BP: (!) 182/97  Pulse: 87  Resp: 17  Temp: 97.9 F (36.6 C)  SpO2: 95%   Filed Weights   03/29/18 1308  Weight: 183 lb (83 kg)    GENERAL:alert, no distress and comfortable SKIN: skin color, texture, turgor are normal, no rashes or significant lesions EYES: normal, Conjunctiva are pink and non-injected, sclera clear OROPHARYNX:no exudate, no erythema and lips, buccal mucosa, and tongue normal  NECK: supple, thyroid normal size, non-tender, without nodularity LYMPH:  no palpable lymphadenopathy in the cervical, axillary or inguinal LUNGS: clear to auscultation and percussion with normal breathing effort HEART: regular rate & rhythm and no murmurs and no  lower extremity edema ABDOMEN:abdomen soft, non-tender and normal bowel sounds MUSCULOSKELETAL:no cyanosis of digits and no clubbing  NEURO: alert & oriented x 3 with fluent speech, no focal motor/sensory deficits EXTREMITIES: No lower extremity edema  LABORATORY DATA:  I have reviewed the data as listed CMP Latest Ref Rng & Units 03/08/2018 08/19/2017 03/12/2017  Glucose 70 - 99 mg/dL - 109(H) -  BUN 6  - 23 mg/dL - 10 -  Creatinine 0.40 - 1.50 mg/dL - 0.79 -  Sodium 135 - 145 mEq/L - 136 -  Potassium 3.5 - 5.1 mEq/L - 4.3 5.4 No visable hemolysis(H)  Chloride 96 - 112 mEq/L - 100 -  CO2 19 - 32 mEq/L - 30 -  Calcium 8.4 - 10.5 mg/dL - 9.5 -  Total Protein 6.5 - 8.1 g/dL 7.0 7.1 -  Total Bilirubin 0.3 - 1.2 mg/dL 1.0 1.1 -  Alkaline Phos 38 - 126 U/L 52 64 -  AST 15 - 41 U/L 19 16 -  ALT 0 - 44 U/L 15 14 -    Lab Results  Component Value Date   WBC 9.7 03/12/2017   HGB 12.1 (L) 03/12/2017   HCT 36.9 (L) 03/12/2017   MCV 92.2 03/12/2017   PLT 372 03/12/2017   NEUTROABS 6.7 (H) 03/12/2017    ASSESSMENT & PLAN:  Malignant neoplasm of rectum Adenocarcinoma the rectum diagnosed December 2004 presented as 3.5 cm mass 5 cm from the anal verge status post neoadjuvant chemoradiation with IV 5-FU followed by APR with colostomy.  Surveillance: Annual CEAs had been normal. CEA done today is pending. CBC without any major abnormalities. patient refuses colonoscopy screening because he does not want anyone to go through the ostomy since it has been working so well and he does not want anyone to mess it up.  Patient will follow with his primary care physician annually.  I recommended that he get a CEA every year. He will be seen by [place] on an as-needed basis.    Orders Placed This Encounter  Procedures  . CEA (IN HOUSE[facility])    Standing Status:   Future    Standing Expiration Date:   03/30/2019  . CEA ([facility] HOU[facility])    Standing Status:   Standing    Number of Occurrences:   10    Standing Expiration Date:   03/30/2019   The patient has a good understanding of the overall plan. he agrees with it. he will call with any problems that may develop before the next visit here.  Nicholas Lose, MD 03/29/2018   I, Cloyde Reams Dorshimer, am acting as scribe for Nicholas Lose, MD.  I have reviewed the above documentation for accuracy and completeness, and I agree with the above.

## 2018-03-21 ENCOUNTER — Other Ambulatory Visit: Payer: Self-pay | Admitting: Cardiovascular Disease

## 2018-03-29 ENCOUNTER — Inpatient Hospital Stay: Payer: BLUE CROSS/BLUE SHIELD

## 2018-03-29 ENCOUNTER — Inpatient Hospital Stay: Payer: BLUE CROSS/BLUE SHIELD | Attending: Hematology and Oncology | Admitting: Hematology and Oncology

## 2018-03-29 DIAGNOSIS — Z7982 Long term (current) use of aspirin: Secondary | ICD-10-CM | POA: Diagnosis not present

## 2018-03-29 DIAGNOSIS — C20 Malignant neoplasm of rectum: Secondary | ICD-10-CM

## 2018-03-29 DIAGNOSIS — Z79899 Other long term (current) drug therapy: Secondary | ICD-10-CM | POA: Diagnosis not present

## 2018-03-29 DIAGNOSIS — Z933 Colostomy status: Secondary | ICD-10-CM

## 2018-03-29 DIAGNOSIS — Z9221 Personal history of antineoplastic chemotherapy: Secondary | ICD-10-CM | POA: Insufficient documentation

## 2018-03-29 DIAGNOSIS — Z923 Personal history of irradiation: Secondary | ICD-10-CM | POA: Diagnosis not present

## 2018-03-29 DIAGNOSIS — Z85048 Personal history of other malignant neoplasm of rectum, rectosigmoid junction, and anus: Secondary | ICD-10-CM | POA: Diagnosis not present

## 2018-03-29 LAB — CEA (IN HOUSE-CHCC): CEA (CHCC-In House): 1.43 ng/mL (ref 0.00–5.00)

## 2018-03-29 NOTE — Assessment & Plan Note (Signed)
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  Surveillance: Annual CEAs had been normal. CEA done today is pending. CBC without any major abnormalities. patient refuses colonoscopy screening because he does not want anyone to go through the ostomy since it has been working so well and he does not want anyone to mess it up.  Return to clinic in 1 year with labs

## 2018-04-01 DIAGNOSIS — I10 Essential (primary) hypertension: Secondary | ICD-10-CM | POA: Diagnosis not present

## 2018-04-01 DIAGNOSIS — M5416 Radiculopathy, lumbar region: Secondary | ICD-10-CM | POA: Diagnosis not present

## 2018-04-01 DIAGNOSIS — M25552 Pain in left hip: Secondary | ICD-10-CM | POA: Diagnosis not present

## 2018-04-01 DIAGNOSIS — I2510 Atherosclerotic heart disease of native coronary artery without angina pectoris: Secondary | ICD-10-CM | POA: Diagnosis not present

## 2018-04-15 DIAGNOSIS — M545 Low back pain: Secondary | ICD-10-CM | POA: Diagnosis not present

## 2018-04-15 DIAGNOSIS — M25552 Pain in left hip: Secondary | ICD-10-CM | POA: Diagnosis not present

## 2018-06-14 ENCOUNTER — Other Ambulatory Visit: Payer: Self-pay

## 2018-06-14 ENCOUNTER — Encounter: Payer: Self-pay | Admitting: Primary Care

## 2018-06-14 ENCOUNTER — Ambulatory Visit (INDEPENDENT_AMBULATORY_CARE_PROVIDER_SITE_OTHER): Payer: BLUE CROSS/BLUE SHIELD | Admitting: Primary Care

## 2018-06-14 VITALS — BP 140/80 | HR 93 | Temp 98.1°F | Ht 66.0 in | Wt 178.5 lb

## 2018-06-14 DIAGNOSIS — J449 Chronic obstructive pulmonary disease, unspecified: Secondary | ICD-10-CM | POA: Diagnosis not present

## 2018-06-14 DIAGNOSIS — J441 Chronic obstructive pulmonary disease with (acute) exacerbation: Secondary | ICD-10-CM | POA: Diagnosis not present

## 2018-06-14 MED ORDER — PREDNISONE 20 MG PO TABS: ORAL_TABLET | ORAL | 0 refills | Status: DC

## 2018-06-14 MED ORDER — ALBUTEROL SULFATE HFA 108 (90 BASE) MCG/ACT IN AERS: 2.0000 | INHALATION_SPRAY | Freq: Four times a day (QID) | RESPIRATORY_TRACT | 0 refills | Status: DC | PRN

## 2018-06-14 MED ORDER — AZITHROMYCIN 250 MG PO TABS: ORAL_TABLET | ORAL | 0 refills | Status: DC

## 2018-06-14 NOTE — Assessment & Plan Note (Signed)
Symptoms today representative of acute exacerbation of COPD, increased sputum production, cough, shortness of breath. Exam with course rhonchi throughout. Afebrile.   Treat with Zpak, prednisone burst, albuterol inhaler. He will update if no improvement.

## 2018-06-14 NOTE — Progress Notes (Signed)
Subjective:    Patient ID: Curtis Campos, male    DOB: 06/14/1940, 78 y.o.   MRN: [id number]  HPI  Mr. Frisina is a 78 year old male with a history of CHF, tobacco abuse, COPD who presents today with a chief complaint of cough.  He also reports nasal congestion, chest congestion, shortness of breath, increased sputum production. His symptoms began 3-4 weeks ago. He denies fevers. He's been around his wife who has had similar symptoms. He's been taking Mucinex cough syrup, albuterol inhaler 2-3 times daily with some improvement.   Review of Systems  Constitutional: Positive for fatigue. Negative for fever.  HENT: Positive for congestion and postnasal drip. Negative for sore throat.   Respiratory: Positive for cough and shortness of breath.        Past Medical History:  Diagnosis Date  . Arthritis 03/12/2011  . CHF (congestive heart failure) ([place])   . Chronic obstructive lung disease ([place])   . Chronic systolic heart failure ([place]) 03/2013   EF of 25% with moderate to severe mitral regurgitation  . Coronary artery disease 03/2013   EF of 25%, moderate to severe mitral regurgitation. Catheterization showed normal cardiac output with mild pulmonary hypertension. There was 99% stenosis in the mid left circumflex which appeared to be chronic with akinetic basal inferior/posterior wall. Mild LAD and RCA disease.  Marland Kitchen Hyperlipidemia   . Hypertension   . Mitral regurgitation   . Rectal cancer ([place])   . Renal cancer [facility])      Social History   Socioeconomic History  . Marital status: Married    Spouse name: Not on file  . Number of children: Not on file  . Years of education: Not on file  . Highest education level: Not on file  Occupational History  . Not on file  Social Needs  . Financial resource strain: Not on file  . Food insecurity:    Worry: Not on file    Inability: Not on file  . Transportation needs:    Medical: Not on file    Non-medical: Not on file  Tobacco Use   . Smoking status: Former Smoker    Packs/day: 1.00    Years: 50.00    Pack years: 50.00    Types: Cigarettes    Last attempt to quit: 03/25/2013    Years since quitting: 5.2  . Smokeless tobacco: Never Used  Substance and Sexual Activity  . Alcohol use: No    Alcohol/week: 0.0 standard drinks  . Drug use: No  . Sexual activity: Yes  Lifestyle  . Physical activity:    Days per week: Not on file    Minutes per session: Not on file  . Stress: Not on file  Relationships  . Social connections:    Talks on phone: Not on file    Gets together: Not on file    Attends religious service: Not on file    Active member of club or organization: Not on file    Attends meetings of clubs or organizations: Not on file    Relationship status: Not on file  . Intimate partner violence:    Fear of current or ex partner: Not on file    Emotionally abused: Not on file    Physically abused: Not on file    Forced sexual activity: Not on file  Other Topics Concern  . Not on file  Social History Narrative  . Not on file    Past Surgical History:  Procedure Laterality Date  . APPENDECTOMY  06/2003  . CARDIAC CATHETERIZATION  03/2013   [facility]  . cataract surgery    . COLOSTOMY    . HERNIA REPAIR    . resection of rectum      Family History  Problem Relation Age of Onset  . Heart disease Mother   . Heart disease Father   . Hyperlipidemia Father   . Hypertension Father   . Heart attack Brother 39       MI  . Heart disease Sister        stents placed   . Cancer - Cervical Sister   . Heart disease Sister        CABG    Allergies  Allergen Reactions  . Codeine Nausea Only  . Statins Other (See Comments)    REACTION: maylgias    Current Outpatient Medications on File Prior to Visit  Medication Sig Dispense Refill  . aspirin 81 MG tablet Take 81 mg by mouth daily. Takes at night     . Evolocumab (REPATHA SURECLICK) 563 MG/ML SOAJ Inject 1 pen into the skin every 14 (fourteen) days.  2 pen 12  . ezetimibe (ZETIA) 10 MG tablet Take 1 tablet by mouth once daily for cholesterol. 90 tablet 3  . losartan (COZAAR) 100 MG tablet TAKE 1 TABLET BY MOUTH ONCE DAILY. 30 tablet 0  . magnesium oxide (MAG-OX) 400 (241.3 Mg) MG tablet TAKE 1 TABLET BY MOUTH ONCE DAILY. 30 tablet 0  . Magnesium Oxide 400 (240 Mg) MG TABS TAKE 1 TABLET BY MOUTH ONCE DAILY. 90 tablet 3  . metoprolol succinate (TOPROL-XL) 100 MG 24 hr tablet TAKE (1) TABLET BY MOUTH DAILY WITH FOOD 30 tablet 10  . traZODone (DESYREL) 50 MG tablet TAKE 1 TABLET BY MOUTH AT BEDTIME AS NEEDED FOR SLEEP 30 tablet 2   No current facility-administered medications on file prior to visit.     BP 140/80   Pulse 93   Temp 98.1 F (36.7 C) (Oral)   Ht 5\' 6"  (1.676 m)   Wt 178 lb 8 oz (81 kg)   SpO2 95%   BMI 28.81 kg/m    Objective:   Physical Exam  Constitutional: He appears well-nourished. He does not appear ill.  HENT:  Right Ear: Tympanic membrane and ear canal normal.  Left Ear: Tympanic membrane and ear canal normal.  Nose: No mucosal edema. Right sinus exhibits no maxillary sinus tenderness and no frontal sinus tenderness. Left sinus exhibits no maxillary sinus tenderness and no frontal sinus tenderness.  Mouth/Throat: Oropharynx is clear and moist.  Neck: Neck supple.  Cardiovascular: Normal rate and regular rhythm.  Respiratory: Effort normal. He has no wheezes. He has rhonchi in the right upper field, the right lower field, the left upper field and the left lower field.  Skin: Skin is warm and dry.           Assessment & Plan:

## 2018-06-14 NOTE — Patient Instructions (Signed)
Start Azithromycin antibiotics for infection. Take 2 tablets by mouth today, then 1 tablet daily for 4 additional days.  Start prednisone 20 mg tablets. Take 2 tablets daily for 5 days.  Shortness of Breath/Wheezing/Cough: Use the albuterol inhaler. Inhale 2 puffs into the lungs every 4 to 6 hours as needed for wheezing, cough, and/or shortness of breath.   Please notify me if you develop fevers and/or start feeling worse.  It was a pleasure to see you today!

## 2018-06-23 ENCOUNTER — Telehealth: Payer: Self-pay | Admitting: Primary Care

## 2018-06-23 DIAGNOSIS — J441 Chronic obstructive pulmonary disease with (acute) exacerbation: Secondary | ICD-10-CM

## 2018-06-23 NOTE — Telephone Encounter (Signed)
Number [phone] Spouse(kathy) called stating pt is a little better.  Pt still is having a lot of congestion and wanted to know if you would call him in another zpack and [place]

## 2018-06-23 NOTE — Telephone Encounter (Signed)
Tried to call patient but busy signal each time.

## 2018-06-24 MED ORDER — BENZONATATE 200 MG PO CAPS: 200.0000 mg | ORAL_CAPSULE | Freq: Three times a day (TID) | ORAL | 0 refills | Status: DC | PRN

## 2018-06-24 NOTE — Telephone Encounter (Addendum)
I speak to patient's wife who stated that his cough is still there, not much improve and requesting another round of Rx to be refills

## 2018-06-24 NOTE — Telephone Encounter (Signed)
Noted.  Prescription for benzonatate capsules sent to pharmacy.

## 2018-06-24 NOTE — Telephone Encounter (Signed)
Catherine (DPR signed)left v/m requesting cb for refill of abx. [place].

## 2018-06-24 NOTE — Telephone Encounter (Signed)
No need for another round of antibiotics as the Zpak will continue to work after he completes the course. What's not better specifically? Cough, shortness of breath?  Is anything better? Is he using his albuterol inhaler?

## 2018-06-24 NOTE — Telephone Encounter (Signed)
If his breathing is okay, then I can send in some cough medication to the pharmacy. Does he want the Tessalon Perles for cough? Non drowsy. Also Mucinex (plain) can help with congestion. Needs to take with 8 oz of water.

## 2018-06-24 NOTE — Telephone Encounter (Signed)
Spoken and notified patient's wife of Curtis Campos comments.   Still coughing, with congestion and productive cough with color to it  A little better but the cough is bothersome  Yes, patient is using the inhaler

## 2018-06-24 NOTE — Telephone Encounter (Signed)
Yes, would like the [employer] Verbalized understanding of Kate's comments.

## 2018-07-04 ENCOUNTER — Ambulatory Visit (INDEPENDENT_AMBULATORY_CARE_PROVIDER_SITE_OTHER): Payer: BLUE CROSS/BLUE SHIELD | Admitting: Primary Care

## 2018-07-04 ENCOUNTER — Other Ambulatory Visit: Payer: Self-pay

## 2018-07-04 DIAGNOSIS — R21 Rash and other nonspecific skin eruption: Secondary | ICD-10-CM | POA: Diagnosis not present

## 2018-07-04 NOTE — Patient Instructions (Signed)
Start Zyrtec once nightly for rash.  You can also apply hydrocortisone cream twice daily for one week.  Please call me in 3 days if no improvement.  It was a pleasure to speak with you today!

## 2018-07-04 NOTE — Assessment & Plan Note (Signed)
Unclear etiology and without exam it's very difficult to discern. It does not appear to be herpes zoster, poison ivy dermatitis, allergic reaction. Differential diagnosis include eczema, contact dermatitis.  He was recently on prednisone for COPD exacerbation so we would like to avoid another round of steroids if possible. We will start with Zyrtec nightly and hydrocortisone cream daily. He verbalized understanding and will call back in 3 days if no improvement.

## 2018-07-04 NOTE — Progress Notes (Signed)
Subjective:    Patient ID: Curtis Campos, male    DOB: 1941/01/09, 78 y.o.   MRN: [id number]  HPI     Curtis Campos - 78 y.o. male  MRN [id number]  Date of Birth: 29-Aug-1940  PCP: [name], NP  This service was provided via telemedicine. Phone Visit performed on 07/04/2018    Rationale for phone visit along with limitations reviewed. Patient consented to telephone encounter.    Location of patient: Home Location of provider: Office [place] @ [facility] Name of referring provider: N/A   Names of persons and role in encounter: Provider: [name], NP  Patient: Curtis Campos  Other: N/A   Time on call: 8 min   Subjective: CC: Rash HPI:  Curtis Campos is a 78 year old male with a history of tobacco abuse, hypertension, CHF, rectal cancer, COPD who presents today with a chief complaint of rash.   He developed "blisters" to bilateral knees/lower extremity. The following day he noticed a red splotch to the bilateral elbows and upper extremities near biceps region. The rashes will wax and wane in color. His rashes are itchy. He denies pain. He denies changes in soaps/detergents/medications. He's been applying alcohol with some improvement. He's not been in the woods.    Objective/Observations:   Speaking in complete sentences. No distress. No cough.   No physical exam or vital signs collected unless specifically identified below.   There were no vitals taken for this visit.   Respiratory status: speaks in complete sentences without evident shortness of breath.   Assessment/Plan:  Rash and non specific skin eruption:  Unclear etiology and without exam it's very difficult to discern. It does not appear to be herpes zoster, poison ivy dermatitis, allergic reaction. Differential diagnosis include eczema, contact dermatitis.  He was recently on prednisone for COPD exacerbation so we would like to avoid another round of steroids if possible. We  will start with Zyrtec nightly and hydrocortisone cream daily. He verbalized understanding and will call back in 3 days if no improvement.   No problem-specific Assessment & Plan notes found for this encounter.   I discussed the assessment and treatment plan with the patient. The patient was provided an opportunity to ask questions and all were answered. The patient agreed with the plan and demonstrated an understanding of the instructions.  Lab Orders  No laboratory test(s) ordered today    No orders of the defined types were placed in this encounter.   The patient was advised to call back or seek an in-person evaluation if the symptoms worsen or if the condition fails to improve as anticipated.  [name], NP    Review of Systems  Constitutional: Negative for fever.  Respiratory: Negative for shortness of breath.   Skin: Positive for rash.       Past Medical History:  Diagnosis Date  . Arthritis 03/12/2011  . CHF (congestive heart failure) ([place])   . Chronic obstructive lung disease ([place])   . Chronic systolic heart failure ([place]) 03/2013   EF of 25% with moderate to severe mitral regurgitation  . Coronary artery disease 03/2013   EF of 25%, moderate to severe mitral regurgitation. Catheterization showed normal cardiac output with mild pulmonary hypertension. There was 99% stenosis in the mid left circumflex which appeared to be chronic with akinetic basal inferior/posterior wall. Mild LAD and RCA disease.  Marland Kitchen Hyperlipidemia   . Hypertension   . Mitral regurgitation   .  Rectal cancer ([place])   . Renal cancer [facility])      Social History   Socioeconomic History  . Marital status: Married    Spouse name: Not on file  . Number of children: Not on file  . Years of education: Not on file  . Highest education level: Not on file  Occupational History  . Not on file  Social Needs  . Financial resource strain: Not on file  . Food insecurity:    Worry: Not on file     Inability: Not on file  . Transportation needs:    Medical: Not on file    Non-medical: Not on file  Tobacco Use  . Smoking status: Former Smoker    Packs/day: 1.00    Years: 50.00    Pack years: 50.00    Types: Cigarettes    Last attempt to quit: 03/25/2013    Years since quitting: 5.2  . Smokeless tobacco: Never Used  Substance and Sexual Activity  . Alcohol use: No    Alcohol/week: 0.0 standard drinks  . Drug use: No  . Sexual activity: Yes  Lifestyle  . Physical activity:    Days per week: Not on file    Minutes per session: Not on file  . Stress: Not on file  Relationships  . Social connections:    Talks on phone: Not on file    Gets together: Not on file    Attends religious service: Not on file    Active member of club or organization: Not on file    Attends meetings of clubs or organizations: Not on file    Relationship status: Not on file  . Intimate partner violence:    Fear of current or ex partner: Not on file    Emotionally abused: Not on file    Physically abused: Not on file    Forced sexual activity: Not on file  Other Topics Concern  . Not on file  Social History Narrative  . Not on file    Past Surgical History:  Procedure Laterality Date  . APPENDECTOMY  06/2003  . CARDIAC CATHETERIZATION  03/2013   [facility]  . cataract surgery    . COLOSTOMY    . HERNIA REPAIR    . resection of rectum      Family History  Problem Relation Age of Onset  . Heart disease Mother   . Heart disease Father   . Hyperlipidemia Father   . Hypertension Father   . Heart attack Brother 76       MI  . Heart disease Sister        stents placed   . Cancer - Cervical Sister   . Heart disease Sister        CABG    Allergies  Allergen Reactions  . Codeine Nausea Only  . Statins Other (See Comments)    REACTION: maylgias    Current Outpatient Medications on File Prior to Visit  Medication Sig Dispense Refill  . albuterol (PROVENTIL HFA;VENTOLIN HFA) 108 (90  Base) MCG/ACT inhaler Inhale 2 puffs into the lungs every 6 (six) hours as needed for wheezing or shortness of breath. 1 Inhaler 0  . aspirin 81 MG tablet Take 81 mg by mouth daily. Takes at night     . benzonatate (TESSALON) 200 MG capsule Take 1 capsule (200 mg total) by mouth 3 (three) times daily as needed for cough. 15 capsule 0  . Evolocumab (REPATHA SURECLICK) 161 MG/ML SOAJ Inject 1  pen into the skin every 14 (fourteen) days. 2 pen 12  . ezetimibe (ZETIA) 10 MG tablet Take 1 tablet by mouth once daily for cholesterol. 90 tablet 3  . losartan (COZAAR) 100 MG tablet TAKE 1 TABLET BY MOUTH ONCE DAILY. 30 tablet 0  . magnesium oxide (MAG-OX) 400 (241.3 Mg) MG tablet TAKE 1 TABLET BY MOUTH ONCE DAILY. 30 tablet 0  . Magnesium Oxide 400 (240 Mg) MG TABS TAKE 1 TABLET BY MOUTH ONCE DAILY. 90 tablet 3  . metoprolol succinate (TOPROL-XL) 100 MG 24 hr tablet TAKE (1) TABLET BY MOUTH DAILY WITH FOOD 30 tablet 10  . traZODone (DESYREL) 50 MG tablet TAKE 1 TABLET BY MOUTH AT BEDTIME AS NEEDED FOR SLEEP 30 tablet 2   No current facility-administered medications on file prior to visit.     There were no vitals taken for this visit.   Objective:   Physical Exam  Constitutional: He is oriented to person, place, and time.  Respiratory: Effort normal.  Neurological: He is alert and oriented to person, place, and time.  Psychiatric: He has a normal mood and affect.           Assessment & Plan:

## 2018-07-13 ENCOUNTER — Telehealth: Payer: Self-pay | Admitting: Cardiovascular Disease

## 2018-07-13 ENCOUNTER — Telehealth: Payer: Self-pay

## 2018-07-13 NOTE — Telephone Encounter (Signed)
Spoke with the pt. Pt sts that he is needing a letter stating his current cardiac dx. Adv the pt he is pastdue for a f/u appt with Dr. Fletcher Anon. He was suppose to f/u with Dr.Arida in Nov 2019. Adv the pt that he will need to have an appt for Dr.Arida to be able to comment on his current cardiac condition.  Adv the pt that due to Covid-19 we are not currently scheduling routine in office visits. Offered the pt a virtual e-vist appt. He does not have access to a smart phone or computer.pt is agreeable with scheduling a Telephone telemedicine visit. appt scheduled for 07/26/18 @ 4:20pm. Asked the pt to have a BP and HR readings taken that day and available for the appt.  Pt has given verbal consent for the appt.

## 2018-07-13 NOTE — Telephone Encounter (Signed)
Patient calling  States he is working with the [employer] and they are requesting he send information with an update on his heart condition Please call to discuss

## 2018-07-13 NOTE — Telephone Encounter (Signed)
Patient has given verbal consent to tele-medicine visit  [place] AN E-VISIT FOR YOUR APPOINTMENT - PLEASE REVIEW IMPORTANT INFORMATION BELOW SEVERAL DAYS PRIOR TO YOUR APPOINTMENT  Due to the recent COVID-19 pandemic, we are transitioning in-person office visits to tele-medicine visits in an effort to decrease unnecessary exposure to our patients, their families, and staff. Medicare and most insurances are covering these visits without a copay needed. We also encourage you to sign up for MyChart if you have not already done so. You will need a smartphone if possible. For patients that do not have this, we can still complete the visit using a regular telephone but do prefer a smartphone to enable video when possible. You may have a family member that lives with you that can help. If possible, we also ask that you have a blood pressure cuff and scale at home to measure your blood pressure, heart rate and weight prior to your scheduled appointment. Patients with clinical needs that need an in-person evaluation and testing will still be able to come to the office if absolutely necessary. If you have any questions, feel free to call our office.   2-3 DAYS BEFORE YOUR APPOINTMENT  You will receive a telephone call from one of our [place] team members - your caller ID may say "Unknown caller." If this is a video visit, we will walk you through how to set up your device to be able to complete the visit. We will remind you check your blood pressure, heart rate and weight prior to your scheduled appointment. If you have an Apple Watch or Kardia, please upload any pertinent ECG strips the day before or morning of your appointment to [place]. Our staff will also make sure you have reviewed the consent and agree to move forward with your scheduled tele-health visit.     THE DAY OF YOUR APPOINTMENT  Approximately 15 minutes prior to your scheduled appointment, you will receive a  telephone call from one of [facility] team - your caller ID may say "Unknown caller."  Our staff will confirm medications, vital signs for the day and any symptoms you may be experiencing. Please have this information available prior to the time of visit start. It may also be helpful for you to have a pad of paper and pen handy for any instructions given during your visit. They will also walk you through joining the smartphone meeting if this is a video visit.    CONSENT FOR TELE-HEALTH VISIT - PLEASE REVIEW  I hereby voluntarily request, consent and authorize [facility] and its employed or contracted physicians, physician assistants, nurse practitioners or other licensed health care professionals (the Practitioner), to provide me with telemedicine health care services (the "Services") as deemed necessary by the treating Practitioner. I acknowledge and consent to receive the Services by the Practitioner via telemedicine. I understand that the telemedicine visit will involve communicating with the Practitioner through live audiovisual communication technology and the disclosure of certain medical information by electronic transmission. I acknowledge that I have been given the opportunity to request an in-person assessment or other available alternative prior to the telemedicine visit and am voluntarily participating in the telemedicine visit.  I understand that I have the right to withhold or withdraw my consent to the use of telemedicine in the course of my care at any time, without affecting my right to future care or treatment, and that the Practitioner or I may terminate the telemedicine visit at any time.  I understand that I have the right to inspect all information obtained and/or recorded in the course of the telemedicine visit and may receive copies of available information for a reasonable fee.  I understand that some of the potential risks of receiving the Services via telemedicine include:   Marland Kitchen Delay or interruption in medical evaluation due to technological equipment failure or disruption; . Information transmitted may not be sufficient (e.g. poor resolution of images) to allow for appropriate medical decision making by the Practitioner; and/or  . In rare instances, security protocols could fail, causing a breach of personal health information.  Furthermore, I acknowledge that it is my responsibility to provide information about my medical history, conditions and care that is complete and accurate to the best of my ability. I acknowledge that Practitioner's advice, recommendations, and/or decision may be based on factors not within their control, such as incomplete or inaccurate data provided by me or distortions of diagnostic images or specimens that may result from electronic transmissions. I understand that the practice of medicine is not an exact science and that Practitioner makes no warranties or guarantees regarding treatment outcomes. I acknowledge that I will receive a copy of this consent concurrently upon execution via email to the email address I last provided but may also request a printed copy by calling the office of [facility].    I understand that my insurance will be billed for this visit.   I have read or had this consent read to me. . I understand the contents of this consent, which adequately explains the benefits and risks of the Services being provided via telemedicine.  . I have been provided ample opportunity to ask questions regarding this consent and the Services and have had my questions answered to my satisfaction. . I give my informed consent for the services to be provided through the use of telemedicine in my medical care  By participating in this telemedicine visit I agree to the above.

## 2018-07-15 ENCOUNTER — Other Ambulatory Visit: Payer: Self-pay | Admitting: Cardiovascular Disease

## 2018-07-26 ENCOUNTER — Telehealth (INDEPENDENT_AMBULATORY_CARE_PROVIDER_SITE_OTHER): Payer: BC Managed Care – PPO | Admitting: Cardiovascular Disease

## 2018-07-26 ENCOUNTER — Encounter: Payer: Self-pay | Admitting: Cardiovascular Disease

## 2018-07-26 ENCOUNTER — Other Ambulatory Visit: Payer: Self-pay

## 2018-07-26 ENCOUNTER — Telehealth: Payer: Self-pay | Admitting: *Deleted

## 2018-07-26 VITALS — BP 130/85 | HR 65 | Ht 66.0 in | Wt 169.0 lb

## 2018-07-26 DIAGNOSIS — I5022 Chronic systolic (congestive) heart failure: Secondary | ICD-10-CM | POA: Diagnosis not present

## 2018-07-26 DIAGNOSIS — I350 Nonrheumatic aortic (valve) stenosis: Secondary | ICD-10-CM

## 2018-07-26 NOTE — Progress Notes (Addendum)
Virtual Visit via Telephone Note   This visit type was conducted due to national recommendations for restrictions regarding the COVID-19 Pandemic (e.g. social distancing) in an effort to limit this patient's exposure and mitigate transmission in our community.  Due to his co-morbid illnesses, this patient is at least at moderate risk for complications without adequate follow up.  This format is felt to be most appropriate for this patient at this time.  The patient did not have access to video technology/had technical difficulties with video requiring transitioning to audio format only (telephone).  All issues noted in this document were discussed and addressed.  No physical exam could be performed with this format.  Please refer to the patient's chart for his  consent to telehealth for Curtis Campos.   Evaluation Performed:  Follow-up visit  Date:  07/26/2018   ID:  [name], Curtis Campos, MRN [id number]  Patient Location: Home Provider Location: Office  PCP:  [name], NP  Cardiologist:  Kathlyn Sacramento, MD  Electrophysiologist:  None   Chief Complaint: Follow-up visit  History of Present Illness:    Curtis Campos is a 78 y.o. male was reached by phone for a follow-up visit regarding chronic systolic heart failure, coronary artery disease, aortic stenosis and mitral regurgitation.  He has known history of hyperlipidemia with intolerance to statins, hypertension, previous rectal cancer, previous tobacco use and COPD.  Echocardiogram in 2015 showed an EF of 25% with moderate mitral regurgitation A right and left cardiac catheterization showed severe one-vessel coronary artery disease with a subtotal occlusion of the mid left circumflex which appeared to be chronic.  Most recent echocardiogram in June 2019 showed an EF of 45 to 50%, moderate aortic stenosis with a valve area of 1.25 cm and moderate mitral regurgitation.  Spironolactone was discontinued due to  hyperkalemia.    The patient was started on Repatha last year with significant improvement in lipid profile.  He has been doing well with no chest pain.  He reports stable exertional dyspnea.  The patient does not have symptoms concerning for COVID-19 infection (fever, chills, cough, or new shortness of breath).    Past Medical History:  Diagnosis Date  . Arthritis 03/12/2011  . CHF (congestive heart failure) ([place])   . Chronic obstructive lung disease ([place])   . Chronic systolic heart failure ([place]) 03/2013   EF of 25% with moderate to severe mitral regurgitation  . Coronary artery disease 03/2013   EF of 25%, moderate to severe mitral regurgitation. Catheterization showed normal cardiac output with mild pulmonary hypertension. There was 99% stenosis in the mid left circumflex which appeared to be chronic with akinetic basal inferior/posterior wall. Mild LAD and RCA disease.  Marland Kitchen Hyperlipidemia   . Hypertension   . Mitral regurgitation   . Rectal cancer ([place])   . Renal cancer [facility])    Past Surgical History:  Procedure Laterality Date  . APPENDECTOMY  06/2003  . CARDIAC CATHETERIZATION  03/2013   [facility]  . cataract surgery    . COLOSTOMY    . HERNIA REPAIR    . resection of rectum       Current Meds  Medication Sig  . albuterol (PROVENTIL HFA;VENTOLIN HFA) 108 (90 Base) MCG/ACT inhaler Inhale 2 puffs into the lungs every 6 (six) hours as needed for wheezing or shortness of breath.  Marland Kitchen aspirin 81 MG tablet Take 81 mg by mouth daily. Takes at night   . Evolocumab (REPATHA SURECLICK) 097 MG/ML SOAJ  Inject 1 pen into the skin every 14 (fourteen) days.  Marland Kitchen ezetimibe (ZETIA) 10 MG tablet Take 1 tablet by mouth once daily for cholesterol.  Marland Kitchen losartan (COZAAR) 100 MG tablet TAKE 1 TABLET BY MOUTH ONCE DAILY.  . Magnesium Oxide 400 (240 Mg) MG TABS TAKE 1 TABLET BY MOUTH ONCE DAILY.  . metoprolol succinate (TOPROL-XL) 100 MG 24 hr tablet Take 1 tablet (100 mg total) by mouth daily. Pt must keep  upcoming appt in Apr to continue receiving refills  . traZODone (DESYREL) 50 MG tablet TAKE 1 TABLET BY MOUTH AT BEDTIME AS NEEDED FOR SLEEP     Allergies:   Codeine and Statins   Social History   Tobacco Use  . Smoking status: Former Smoker    Packs/day: 1.00    Years: 50.00    Pack years: 50.00    Types: Cigarettes    Last attempt to quit: 03/25/2013    Years since quitting: 5.3  . Smokeless tobacco: Never Used  Substance Use Topics  . Alcohol use: No    Alcohol/week: 0.0 standard drinks  . Drug use: No     Family Hx: The patient's family history includes Cancer - Cervical in his sister; Heart attack (age of onset: 40) in his brother; Heart disease in his father, mother, sister, and sister; Hyperlipidemia in his father; Hypertension in his father.  ROS:   Please see the history of present illness.     All other systems reviewed and are negative.   Prior CV studies:   The following studies were reviewed today:  I reviewed echocardiogram which was done in June 2019  Labs/Other Tests and Data Reviewed:    EKG:  No ECG reviewed.  Recent Labs: 08/19/2017: BUN 10; Creatinine, Ser 0.79; Potassium 4.3; Sodium 136 03/08/2018: ALT 15   Recent Lipid Panel Lab Results  Component Value Date/Time   CHOL 176 03/08/2018 12:45 PM   TRIG 141 03/08/2018 12:45 PM   HDL 70 03/08/2018 12:45 PM   CHOLHDL 2.5 03/08/2018 12:45 PM   LDLCALC 78 03/08/2018 12:45 PM   LDLDIRECT 129.7 04/14/2012 07:56 AM    Wt Readings from Last 3 Encounters:  07/26/18 169 lb (76.7 kg)  06/14/18 178 lb 8 oz (81 kg)  03/29/18 183 lb (83 kg)     Objective:    Vital Signs:  BP 130/85   Pulse 65   Ht 5\' 6"  (1.676 m)   Wt 169 lb (76.7 kg)   BMI 27.28 kg/m    VITAL SIGNS:  reviewed  ASSESSMENT & PLAN:    1. Chronic systolic heart failure: He is doing very well overall.  Most recent echo showed an EF of 45 to 50%.  Continue treatment with Toprol and losartan.  He did not tolerate spironolactone  in the past due to hyperkalemia.    2. Coronary artery disease: Currently with no anginal symptoms. Continue medical therapy.  3. Essential hypertension: Blood pressure improved since losartan dose was increased  4. Hyperlipidemia: He is intolerant to statins .  He is doing very well with Repatha and Zetia with significant improvement in lipid profile.  LDL improved to 78.  5. Moderate aortic stenosis and moderate mitral regurgitation.    Recommend a repeat echocardiogram in 6 months from now  6.  Agent Orange exposure: The patient asked if this could have contributed to his heart disease.  I explained to him that Agent Orange exposure can lead or contribute to ischemic heart disease which he has.  COVID-19 Education: The signs and symptoms of COVID-19 were discussed with the patient and how to seek care for testing (follow up with PCP or arrange E-visit).  The importance of social distancing was discussed today.  Time:   Today, I have spent 20 minutes with the patient with telehealth technology discussing the above problems.     Medication Adjustments/Labs and Tests Ordered: Current medicines are reviewed at length with the patient today.  Concerns regarding medicines are outlined above.   Tests Ordered: No orders of the defined types were placed in this encounter.   Medication Changes: No orders of the defined types were placed in this encounter.   Disposition:  Follow up in 6 month(s)  Signed, Kathlyn Sacramento, MD  07/26/2018 1:19 PM    [facility]

## 2018-07-26 NOTE — Patient Instructions (Signed)
Medication Instructions:  Continue same medications If you need a refill on your cardiac medications before your next appointment, please call your pharmacy.   Lab work: None If you have labs (blood work) drawn today and your tests are completely normal, you will receive your results only by: Marland Kitchen MyChart Message (if you have MyChart) OR . A paper copy in the mail If you have any lab test that is abnormal or we need to change your treatment, we will call you to review the results.  Testing/Procedures: Echocardiogram in 6 months  Follow-Up: At [facility], you and your health needs are our priority.  As part of our continuing mission to provide you with exceptional heart care, we have created designated Provider Care Teams.  These Care Teams include your primary Cardiologist (physician) and Advanced Practice Providers (APPs -  Physician Assistants and Nurse Practitioners) who all work together to provide you with the care you need, when you need it. You will need a follow up appointment in 6 months.  Please call our office 2 months in advance to schedule this appointment.  You may see Kathlyn Sacramento, MD or one of the following Advanced Practice Providers on your designated Care Team:   Murray Hodgkins, NP Christell Faith, PA-C Marrianne Mood, PA-C

## 2018-07-26 NOTE — Telephone Encounter (Signed)
Left a message for the patient to call back. He stated that he had specific instructions for his telehealth visit notes to be faxed to the [place].

## 2018-07-26 NOTE — Addendum Note (Signed)
Addended by: Ricci Barker on: 07/26/2018 01:37 PM   Modules accepted: Orders

## 2018-07-27 NOTE — Telephone Encounter (Signed)
Agent Orange exposure can lead or contribute to ischemic heart disease which the patient has.

## 2018-07-27 NOTE — Telephone Encounter (Signed)
I spoke with the patient. He states he was needing his telehealth visit from yesterday emailed to his daughter, Rolinda Roan at:  [email or web address]  He states she will forward this to the [place] doctor.   The patient also had a question for Dr. Fletcher Anon that he forgot to ask yesterday. The patient states he was in [place] and exposed to [employer]- was was wanting to know if exposure to this could have contributed to his heart condition at all.  I advised the patient I was unsure and would need to get input from Dr. Fletcher Anon and we will need to call him back.   The patient voices understanding and is agreeable.

## 2018-07-27 NOTE — Telephone Encounter (Signed)
Patient returning call.

## 2018-07-27 NOTE — Telephone Encounter (Signed)
I attempted to call and speak with the patient about Dr. Tyrell Antonio feedback on [employer].  Per [name], the patient is currently taking a nap. I advised her of Dr. Tyrell Antonio comments regarding Agent Orange (ok per [facility]).  She is asking if this can be added to his visit notes that will be submitted to the [place] as he is going to try to apply for disability.  I advised her I was unsure, but I would check with Dr. Fletcher Anon. She is aware that any information will not be emailed to [employer] until Dr. Tyrell Antonio nurse is back tomorrow as I wanted to make sure there was nothing else she was needing.   [name] voices understanding and is agreeable.

## 2018-07-28 NOTE — Telephone Encounter (Signed)
The patient has been called and notified that due to [place] and security reasons that the AVS cannot be emailed. He does have a MyChart account that he thinks his daughter handles. He will call her and verify and then let the office know if anything further is needed.

## 2018-08-12 ENCOUNTER — Telehealth: Payer: Self-pay | Admitting: Cardiovascular Disease

## 2018-08-12 NOTE — Telephone Encounter (Signed)
Patient calling States he has lost his insurance He is now needing to work on getting Repatha from [place] needs information stating the medication is necessary for patient Please fax to [phone], attn: [place]

## 2018-08-12 NOTE — Telephone Encounter (Signed)
Please do what's needed. thanks

## 2018-08-13 ENCOUNTER — Other Ambulatory Visit: Payer: Self-pay | Admitting: Primary Care

## 2018-08-13 DIAGNOSIS — J449 Chronic obstructive pulmonary disease, unspecified: Secondary | ICD-10-CM

## 2018-08-15 ENCOUNTER — Telehealth: Payer: Self-pay

## 2018-08-15 NOTE — Telephone Encounter (Signed)
PA started through Covermymeds.   Curtis Campos (Key: [id number]) Repatha SureClick 140MG /ML auto-injectors  Awaiting response

## 2018-08-15 NOTE — Telephone Encounter (Signed)
Request Reference Number: [id number]. REPATHA SURE INJ 140MG /ML is denied for not meeting the prior authorization requirement(s). For further questions, call [phone]. Appeals are not supported through [place]. Please refer to the fax case notice for appeals information and instructions.

## 2018-08-23 ENCOUNTER — Encounter: Payer: Self-pay | Admitting: *Deleted

## 2018-08-23 NOTE — Telephone Encounter (Signed)
Patient called and made aware that the fax number provided was not working. He will call back with another one.

## 2018-08-23 NOTE — Telephone Encounter (Signed)
The patient stated that the VA was sending him a letter of denial. The repatha has been denied. Once he gets that he will get the letter to the office so we can fill out the appeal form for him.   A letter of medical necessity has been completed in his chart already.

## 2018-08-24 NOTE — Telephone Encounter (Signed)
Patient calling States that he spoke with the VA  They do not have Repatha but they use Alirocumab  Would like to know if Dr Fletcher Anon can send a prescription on that medication Fax number is [phone] Please advise

## 2018-08-25 MED ORDER — ALIROCUMAB 75 MG/ML ~~LOC~~ SOAJ: 1.0000 "pen " | SUBCUTANEOUS | 11 refills | Status: DC

## 2018-08-25 NOTE — Addendum Note (Signed)
Addended by: Lamar Laundry on: 08/25/2018 02:13 PM   Modules accepted: Orders

## 2018-08-25 NOTE — Telephone Encounter (Signed)
Alirocumab (Praluent) 75 mg every 2 weeks.  I am fine with this.

## 2018-08-25 NOTE — Addendum Note (Signed)
Addended by: Lamar Laundry on: 08/25/2018 01:57 PM   Modules accepted: Orders

## 2018-08-25 NOTE — Telephone Encounter (Signed)
Dr. Fletcher Anon in agreement with the alternative medication Praluent. An written Rx has been faxed to the [facility] fax # he provided.  Attempted to contact the pt. Pt phone rings out with no voicemail.

## 2018-08-26 ENCOUNTER — Telehealth: Payer: Self-pay | Admitting: Cardiovascular Disease

## 2018-08-26 NOTE — Telephone Encounter (Signed)
Pt c/o medication issue:  1. Name of Medication: replacement for repatha   2. How are you currently taking this medication (dosage and times per day)?   3. Are you having a reaction (difficulty breathing--STAT)? No   4. What is your medication issue? Patient wants to know if the new med inj as well

## 2018-08-26 NOTE — Telephone Encounter (Signed)
Spoke with the pt and adv him the the Rx for Praluent has been faxed the [place] as requested. The pt voiced appreciation for the assistance.

## 2018-08-26 NOTE — Telephone Encounter (Signed)
Spoke with the pt. Adv him that Praluent is also an injectable, should be adm subq every 14 days. Pt has no further questions at this time and voiced appreciation for the call.

## 2018-08-29 ENCOUNTER — Telehealth: Payer: Self-pay | Admitting: Primary Care

## 2018-08-29 DIAGNOSIS — E785 Hyperlipidemia, unspecified: Secondary | ICD-10-CM

## 2018-08-29 DIAGNOSIS — J449 Chronic obstructive pulmonary disease, unspecified: Secondary | ICD-10-CM

## 2018-08-29 DIAGNOSIS — G4700 Insomnia, unspecified: Secondary | ICD-10-CM

## 2018-08-29 MED ORDER — TRAZODONE HCL 50 MG PO TABS: 50.0000 mg | ORAL_TABLET | Freq: Every evening | ORAL | 0 refills | Status: DC | PRN

## 2018-08-29 MED ORDER — EZETIMIBE 10 MG PO TABS: ORAL_TABLET | ORAL | 1 refills | Status: DC

## 2018-08-29 MED ORDER — ALBUTEROL SULFATE HFA 108 (90 BASE) MCG/ACT IN AERS: INHALATION_SPRAY | RESPIRATORY_TRACT | 0 refills | Status: DC

## 2018-08-29 NOTE — Telephone Encounter (Signed)
Spoken to patient and confirm to sent his Rx to [facility].  We can refill his albuterol inhaler, trazodone 50 mg, and ezetimibe 10 mg.  Patient verbalized understanding.

## 2018-08-29 NOTE — Telephone Encounter (Signed)
Best number [phone]   Karna Christmas E @ [facility] pt would like to [facility]   Terri stated pt wanted all rx transfer  Didn't have names of all rx's  [facility] phone [phone] Fax [phone]

## 2018-10-17 ENCOUNTER — Other Ambulatory Visit: Payer: Self-pay | Admitting: Cardiovascular Disease

## 2018-11-24 ENCOUNTER — Telehealth: Payer: Self-pay | Admitting: Primary Care

## 2018-11-24 ENCOUNTER — Telehealth: Payer: Self-pay | Admitting: Cardiovascular Disease

## 2018-11-24 NOTE — Telephone Encounter (Signed)
Curtis Campos, Patient daughter stated that she is trying to fill out a DA( disability claim) for the [place].  She stated that she is needing information or documents/labs that states he has diabetes or pre diabetes.   She is requesting a call back to give more information on the paperwork  C/B # [phone]

## 2018-11-24 NOTE — Telephone Encounter (Signed)
Please call patient's daughter. Any information or documents, they will need to contact [employer] to get copy of records from [place] and medical record release form need to completed. I do not know a lot of information regarding applying for disability.

## 2018-11-24 NOTE — Telephone Encounter (Signed)
Please see 11/24/18 mychart message for more information.  VA form given to [facility] to be sent to [employer] for completion.

## 2018-11-24 NOTE — Telephone Encounter (Signed)
Patient daughter wants to know if Patient is on repatha for arthrosclerosis .  VA agent orange claim related.  Patient needs to be on chronic med regime for arthrosclerosis

## 2018-11-25 NOTE — Telephone Encounter (Signed)
Spoke with the patient daughter Joelene Millin. They are trying to get the patient approved by the [facility] for benefits related to the patient's agent orange exposure. They are requesting a statement from a physician stating that the patient has ongoing treatment for atherosclerosis. The patient is on Praluent and Zetia. Joelene Millin will upload a copy of letter from the [place] to [place] that indicates what is being asked.   Salena Saner that I will fwd the rqst to Dr. Fletcher Anon and call back with his response. Joelene Millin voiced appreciation for the assistance.

## 2018-11-25 NOTE — Telephone Encounter (Signed)
I have printed the paperwork from [place]. Not sure what we need to do. Will place in Kate's inbox and see what she wants [place] to do.

## 2018-11-25 NOTE — Telephone Encounter (Signed)
Patient wants rn to call her and talk about a letter for the [place] stating Repatha is taken for Arthrosclerosis.  Patient daughter wants to discuss .  Please call.

## 2018-11-28 NOTE — Telephone Encounter (Signed)
Spoke with Joelene Millin via My Chart, see notes.

## 2018-11-30 NOTE — Telephone Encounter (Signed)
Letter drafted and placed in Dr.Arida's in box to be signed.

## 2018-11-30 NOTE — Telephone Encounter (Signed)
Can you please type the following letter so I can sign: "Curtis Campos has been under my care since 2015 when he was diagnosed with acute systolic heart failure with an ejection fraction of 25%.  He was found to have subtotally occluded left circumflex on cardiac catheterization and is currently being treated for chronic systolic heart failure and ischemic heart disease.  In addition, he is being treated for moderate aortic stenosis, moderate mitral regurgitation, essential hypertension and hyperlipidemia.  His ejection fraction did improve with medical therapy to 45% in 2019."

## 2018-12-02 ENCOUNTER — Encounter: Payer: Self-pay | Admitting: Hematology and Oncology

## 2018-12-06 NOTE — Telephone Encounter (Signed)
Letter sent through [place] as requested by the patient's daughter Joelene Millin.

## 2018-12-07 ENCOUNTER — Encounter: Payer: Self-pay | Admitting: Cardiovascular Disease

## 2018-12-08 ENCOUNTER — Encounter: Payer: Self-pay | Admitting: *Deleted

## 2018-12-13 NOTE — Telephone Encounter (Signed)
Curtis Campos, see patient's daughter's message. Can you help her?

## 2018-12-14 NOTE — Telephone Encounter (Signed)
Curtis Campos, can you assist her with getting records for her father. It looks like we just need notes stating that he does have insomnia, mood disorder, depression/anxiety, etc. I think my notes from January and June 2019 should suffice.  Carrie, nevermind. Thanks.

## 2018-12-16 NOTE — Telephone Encounter (Signed)
Forms received from [employer]. Placed in nurse box

## 2018-12-21 ENCOUNTER — Telehealth: Payer: Self-pay

## 2018-12-21 NOTE — Telephone Encounter (Signed)
Forms received from [facility] placed in Dr. Tyrell Antonio in box to be signed on his return.

## 2019-01-05 ENCOUNTER — Ambulatory Visit (INDEPENDENT_AMBULATORY_CARE_PROVIDER_SITE_OTHER): Payer: Medicare Other

## 2019-01-05 DIAGNOSIS — Z23 Encounter for immunization: Secondary | ICD-10-CM

## 2019-01-06 NOTE — Telephone Encounter (Signed)
Completed forms sent to [employer] interoffice mail

## 2019-01-23 ENCOUNTER — Other Ambulatory Visit: Payer: Self-pay | Admitting: Primary Care

## 2019-01-23 DIAGNOSIS — E785 Hyperlipidemia, unspecified: Secondary | ICD-10-CM

## 2019-01-23 DIAGNOSIS — R7303 Prediabetes: Secondary | ICD-10-CM

## 2019-01-23 DIAGNOSIS — I10 Essential (primary) hypertension: Secondary | ICD-10-CM

## 2019-01-31 ENCOUNTER — Other Ambulatory Visit (INDEPENDENT_AMBULATORY_CARE_PROVIDER_SITE_OTHER): Payer: Medicare Other

## 2019-01-31 DIAGNOSIS — I10 Essential (primary) hypertension: Secondary | ICD-10-CM | POA: Diagnosis not present

## 2019-01-31 DIAGNOSIS — R7303 Prediabetes: Secondary | ICD-10-CM

## 2019-01-31 DIAGNOSIS — E785 Hyperlipidemia, unspecified: Secondary | ICD-10-CM | POA: Diagnosis not present

## 2019-01-31 LAB — COMPREHENSIVE METABOLIC PANEL
ALT: 11 U/L (ref 0–53)
AST: 14 U/L (ref 0–37)
Albumin: 4.2 g/dL (ref 3.5–5.2)
Alkaline Phosphatase: 56 U/L (ref 39–117)
BUN: 15 mg/dL (ref 6–23)
CO2: 28 mEq/L (ref 19–32)
Calcium: 9 mg/dL (ref 8.4–10.5)
Chloride: 99 mEq/L (ref 96–112)
Creatinine, Ser: 0.9 mg/dL (ref 0.40–1.50)
GFR: 81.55 mL/min (ref >60.00)
Glucose, Bld: 165 mg/dL — ABNORMAL HIGH (ref 70–99)
Potassium: 4.2 mEq/L (ref 3.5–5.1)
Sodium: 134 mEq/L — ABNORMAL LOW (ref 135–145)
Total Bilirubin: 1 mg/dL (ref 0.2–1.2)
Total Protein: 6.8 g/dL (ref 6.0–8.3)

## 2019-01-31 LAB — CBC
HCT: 37.7 % — ABNORMAL LOW (ref 39.0–52.0)
Hemoglobin: 12.4 g/dL — ABNORMAL LOW (ref 13.0–17.0)
MCHC: 32.9 g/dL (ref 30.0–36.0)
MCV: 93.1 fl (ref 78.0–100.0)
Platelets: 293 10*3/uL (ref 150.0–400.0)
RBC: 4.05 Mil/uL — ABNORMAL LOW (ref 4.22–5.81)
RDW: 14.2 % (ref 11.5–15.5)
WBC: 7.2 10*3/uL (ref 4.0–10.5)

## 2019-01-31 LAB — LIPID PANEL
Cholesterol: 163 mg/dL (ref 0–200)
HDL: 56.7 mg/dL (ref >39.00)
LDL Cholesterol: 85 mg/dL (ref 0–99)
NonHDL: 106.33
Total CHOL/HDL Ratio: 3
Triglycerides: 105 mg/dL (ref 0.0–149.0)
VLDL: 21 mg/dL (ref 0.0–40.0)

## 2019-02-01 LAB — HEMOGLOBIN A1C: Hgb A1c MFr Bld: 5.7 % (ref 4.6–6.5)

## 2019-02-07 ENCOUNTER — Encounter: Payer: Self-pay | Admitting: Primary Care

## 2019-02-07 ENCOUNTER — Other Ambulatory Visit: Payer: Self-pay

## 2019-02-07 ENCOUNTER — Encounter: Payer: Self-pay | Admitting: *Deleted

## 2019-02-07 ENCOUNTER — Ambulatory Visit (INDEPENDENT_AMBULATORY_CARE_PROVIDER_SITE_OTHER): Payer: Medicare Other | Admitting: Primary Care

## 2019-02-07 VITALS — BP 128/84 | HR 70 | Temp 97.8°F | Ht 66.0 in | Wt 179.5 lb

## 2019-02-07 DIAGNOSIS — Z0000 Encounter for general adult medical examination without abnormal findings: Secondary | ICD-10-CM | POA: Insufficient documentation

## 2019-02-07 DIAGNOSIS — I471 Supraventricular tachycardia: Secondary | ICD-10-CM

## 2019-02-07 DIAGNOSIS — Z23 Encounter for immunization: Secondary | ICD-10-CM | POA: Diagnosis not present

## 2019-02-07 DIAGNOSIS — E785 Hyperlipidemia, unspecified: Secondary | ICD-10-CM

## 2019-02-07 DIAGNOSIS — F39 Unspecified mood [affective] disorder: Secondary | ICD-10-CM

## 2019-02-07 DIAGNOSIS — F439 Reaction to severe stress, unspecified: Secondary | ICD-10-CM

## 2019-02-07 DIAGNOSIS — I5022 Chronic systolic (congestive) heart failure: Secondary | ICD-10-CM

## 2019-02-07 DIAGNOSIS — R7303 Prediabetes: Secondary | ICD-10-CM

## 2019-02-07 DIAGNOSIS — C20 Malignant neoplasm of rectum: Secondary | ICD-10-CM

## 2019-02-07 DIAGNOSIS — J449 Chronic obstructive pulmonary disease, unspecified: Secondary | ICD-10-CM

## 2019-02-07 DIAGNOSIS — I2510 Atherosclerotic heart disease of native coronary artery without angina pectoris: Secondary | ICD-10-CM

## 2019-02-07 DIAGNOSIS — G4700 Insomnia, unspecified: Secondary | ICD-10-CM

## 2019-02-07 DIAGNOSIS — I340 Nonrheumatic mitral (valve) insufficiency: Secondary | ICD-10-CM

## 2019-02-07 DIAGNOSIS — I10 Essential (primary) hypertension: Secondary | ICD-10-CM

## 2019-02-07 DIAGNOSIS — I4719 Other supraventricular tachycardia: Secondary | ICD-10-CM

## 2019-02-07 MED ORDER — ZOSTER VAC RECOMB ADJUVANTED 50 MCG/0.5ML IM SUSR: 0.5000 mL | Freq: Once | INTRAMUSCULAR | 1 refills | Status: AC

## 2019-02-07 NOTE — Assessment & Plan Note (Signed)
Denies concerns for anxiety and depression, seems to be doing well on Trazodone HS for sleep. Continue same.

## 2019-02-07 NOTE — Assessment & Plan Note (Signed)
Immunizations UTD, Rx for Shingrix provided. Colon cancer screening UTD, following with GI. Encouraged a healthy diet and regular exercise. Exam today stable. Labs reviewed.

## 2019-02-07 NOTE — Patient Instructions (Addendum)
Start exercising. You should be getting 150 minutes of exercise weekly.  Work on a healthy diet. Be sure to drink at least 4-5 bottles of water daily.  Take the Shingles vaccination to your pharmacy.  Follow up with the GI doctor this December.  It was a pleasure to see you today!   Preventive Care 3 Years and Older, Male Preventive care refers to lifestyle choices and visits with your health care provider that can promote health and wellness. This includes:  A yearly physical exam. This is also called an annual well check.  Regular dental and eye exams.  Immunizations.  Screening for certain conditions.  Healthy lifestyle choices, such as diet and exercise. What can I expect for my preventive care visit? Physical exam Your health care provider will check:  Height and weight. These may be used to calculate body mass index (BMI), which is a measurement that tells if you are at a healthy weight.  Heart rate and blood pressure.  Your skin for abnormal spots. Counseling Your health care provider may ask you questions about:  Alcohol, tobacco, and drug use.  Emotional well-being.  Home and relationship well-being.  Sexual activity.  Eating habits.  History of falls.  Memory and ability to understand (cognition).  Work and work Statistician. What immunizations do I need?  Influenza (flu) vaccine  This is recommended every year. Tetanus, diphtheria, and pertussis (Tdap) vaccine  You may need a Td booster every 10 years. Varicella (chickenpox) vaccine  You may need this vaccine if you have not already been vaccinated. Zoster (shingles) vaccine  You may need this after age 80. Pneumococcal conjugate (PCV13) vaccine  One dose is recommended after age 60. Pneumococcal polysaccharide (PPSV23) vaccine  One dose is recommended after age 38. Measles, mumps, and rubella (MMR) vaccine  You may need at least one dose of MMR if you were born in 1957 or later. You  may also need a second dose. Meningococcal conjugate (MenACWY) vaccine  You may need this if you have certain conditions. Hepatitis A vaccine  You may need this if you have certain conditions or if you travel or work in places where you may be exposed to hepatitis A. Hepatitis B vaccine  You may need this if you have certain conditions or if you travel or work in places where you may be exposed to hepatitis B. Haemophilus influenzae type b (Hib) vaccine  You may need this if you have certain conditions. You may receive vaccines as individual doses or as more than one vaccine together in one shot (combination vaccines). Talk with your health care provider about the risks and benefits of combination vaccines. What tests do I need? Blood tests  Lipid and cholesterol levels. These may be checked every 5 years, or more frequently depending on your overall health.  Hepatitis C test.  Hepatitis B test. Screening  Lung cancer screening. You may have this screening every year starting at age 33 if you have a 30-pack-year history of smoking and currently smoke or have quit within the past 15 years.  Colorectal cancer screening. All adults should have this screening starting at age 32 and continuing until age 59. Your health care provider may recommend screening at age 27 if you are at increased risk. You will have tests every 1-10 years, depending on your results and the type of screening test.  Prostate cancer screening. Recommendations will vary depending on your family history and other risks.  Diabetes screening. This is done by  checking your blood sugar (glucose) after you have not eaten for a while (fasting). You may have this done every 1-3 years.  Abdominal aortic aneurysm (AAA) screening. You may need this if you are a current or former smoker.  Sexually transmitted disease (STD) testing. Follow these instructions at home: Eating and drinking  Eat a diet that includes fresh  fruits and vegetables, whole grains, lean protein, and low-fat dairy products. Limit your intake of foods with high amounts of sugar, saturated fats, and salt.  Take vitamin and mineral supplements as recommended by your health care provider.  Do not drink alcohol if your health care provider tells you not to drink.  If you drink alcohol: ? Limit how much you have to 0-2 drinks a day. ? Be aware of how much alcohol is in your drink. In the [place] one drink equals one 12 oz bottle of beer (355 mL), one 5 oz glass of wine (148 mL), or one 1 oz glass of hard liquor (44 mL). Lifestyle  Take daily care of your teeth and gums.  Stay active. Exercise for at least 30 minutes on 5 or more days each week.  Do not use any products that contain nicotine or tobacco, such as cigarettes, e-cigarettes, and chewing tobacco. If you need help quitting, ask your health care provider.  If you are sexually active, practice safe sex. Use a condom or other form of protection to prevent STIs (sexually transmitted infections).  Talk with your health care provider about taking a low-dose aspirin or statin. What's next?  Visit your health care provider once a year for a well check visit.  Ask your health care provider how often you should have your eyes and teeth checked.  Stay up to date on all vaccines. This information is not intended to replace advice given to you by your health care provider. Make sure you discuss any questions you have with your health care provider. Document Released: 04/12/2015 Document Revised: 03/10/2018 Document Reviewed: 03/10/2018 Elsevier Patient Education  2020 [employer].

## 2019-02-07 NOTE — Assessment & Plan Note (Signed)
Overall appears stable, lungs clear on exam. Using albuterol PRN.

## 2019-02-07 NOTE — Assessment & Plan Note (Signed)
LDL stable on recent labs. Continue current regimen.

## 2019-02-07 NOTE — Assessment & Plan Note (Signed)
Stable in the office today, continue losartan and metoprolol. CMP reviewed.

## 2019-02-07 NOTE — Assessment & Plan Note (Signed)
Asymptomatic.  Compliant to Pralulent, aspirin, Zetia. LDL at stable range.

## 2019-02-07 NOTE — Assessment & Plan Note (Signed)
Doing well on Trazodone, continue same.

## 2019-02-07 NOTE — Assessment & Plan Note (Signed)
Appears euvolemic.  Following with cardiology. Continue current regimen.

## 2019-02-07 NOTE — Assessment & Plan Note (Signed)
Denies concerns for anxiety and depression, continue Trazodone.

## 2019-02-07 NOTE — Assessment & Plan Note (Signed)
A1C of 5.7 on recent labs. Encouraged to reduce sweets, increase activity level as tolerated.

## 2019-02-07 NOTE — Assessment & Plan Note (Signed)
Stable on metoprolol, following with cardiology.

## 2019-02-07 NOTE — Assessment & Plan Note (Signed)
Moderate regurgitation noted on echo from 2019, seems to be a stable finding. Aortic stenosis stable. Following with cardiology.

## 2019-02-07 NOTE — Progress Notes (Signed)
Subjective:    Patient ID: Curtis Campos, male    DOB: 04-Jul-1940, 78 y.o.   MRN: [id number]  HPI  Curtis Campos is a 78 year old male who presents today for complete physical.  Immunizations: -Influenza: Completed this season  -Shingles: Completed Zostavax in 2015 -Pneumonia: Completed in 2008 and 2015  Diet: He endorses a healthy diet. Mostly home cooked meals. Desserts daily. Drinking mostly water, some sweet tea. Exercise: He is not exercising.   Eye exam: Completed about one year ago. Dental exam: No recent exam  Colonoscopy: Completed in 2012, following with GI. PSA: 0.82 in 2018  BP Readings from Last 3 Encounters:  02/07/19 128/84  07/26/18 130/85  06/14/18 140/80     Review of Systems  Constitutional: Negative for unexpected weight change.  HENT: Negative for rhinorrhea.   Respiratory: Negative for cough and shortness of breath.        Chronic moderate exertional dyspnea   Cardiovascular: Negative for chest pain.  Gastrointestinal: Negative for constipation and diarrhea.  Genitourinary:       Chronic slower stream, no acute changes  Musculoskeletal: Negative for arthralgias and myalgias.  Skin: Negative for rash.  Allergic/Immunologic: Negative for environmental allergies.  Neurological: Negative for dizziness and headaches.  Psychiatric/Behavioral:       Chronic sleep disturbance, doing well on Trazodone       Past Medical History:  Diagnosis Date  . Arthritis 03/12/2011  . CHF (congestive heart failure) ([place])   . Chronic obstructive lung disease ([place])   . Chronic systolic heart failure ([place]) 03/2013   EF of 25% with moderate to severe mitral regurgitation  . Coronary artery disease 03/2013   EF of 25%, moderate to severe mitral regurgitation. Catheterization showed normal cardiac output with mild pulmonary hypertension. There was 99% stenosis in the mid left circumflex which appeared to be chronic with akinetic basal inferior/posterior wall. Mild  LAD and RCA disease.  Marland Kitchen Hyperlipidemia   . Hypertension   . Mitral regurgitation   . Rectal cancer ([place])   . Renal cancer [facility])      Social History   Socioeconomic History  . Marital status: Married    Spouse name: Not on file  . Number of children: Not on file  . Years of education: Not on file  . Highest education level: Not on file  Occupational History  . Not on file  Social Needs  . Financial resource strain: Not on file  . Food insecurity    Worry: Not on file    Inability: Not on file  . Transportation needs    Medical: Not on file    Non-medical: Not on file  Tobacco Use  . Smoking status: Former Smoker    Packs/day: 1.00    Years: 50.00    Pack years: 50.00    Types: Cigarettes    Quit date: 03/25/2013    Years since quitting: 5.8  . Smokeless tobacco: Never Used  Substance and Sexual Activity  . Alcohol use: No    Alcohol/week: 0.0 standard drinks  . Drug use: No  . Sexual activity: Yes  Lifestyle  . Physical activity    Days per week: Not on file    Minutes per session: Not on file  . Stress: Not on file  Relationships  . Social Herbalist on phone: Not on file    Gets together: Not on file    Attends religious service: Not on file  Active member of club or organization: Not on file    Attends meetings of clubs or organizations: Not on file    Relationship status: Not on file  . Intimate partner violence    Fear of current or ex partner: Not on file    Emotionally abused: Not on file    Physically abused: Not on file    Forced sexual activity: Not on file  Other Topics Concern  . Not on file  Social History Narrative  . Not on file    Past Surgical History:  Procedure Laterality Date  . APPENDECTOMY  06/2003  . CARDIAC CATHETERIZATION  03/2013   [facility]  . cataract surgery    . COLOSTOMY    . HERNIA REPAIR    . resection of rectum      Family History  Problem Relation Age of Onset  . Heart disease Mother   . Heart  disease Father   . Hyperlipidemia Father   . Hypertension Father   . Heart attack Brother 33       MI  . Heart disease Sister        stents placed   . Cancer - Cervical Sister   . Heart disease Sister        CABG    Allergies  Allergen Reactions  . Codeine Nausea Only  . Statins Other (See Comments)    REACTION: maylgias    Current Outpatient Medications on File Prior to Visit  Medication Sig Dispense Refill  . albuterol (VENTOLIN HFA) 108 (90 Base) MCG/ACT inhaler INHALE 2 PUFFS BY MOUTH EVERY 6 HOURS AS NEEDED FOR SHORTNESS OF BREATH 18 g 0  . Alirocumab (PRALUENT) 75 MG/ML SOAJ Inject 1 pen into the skin every 14 (fourteen) days. 2 pen 11  . aspirin 81 MG tablet Take 81 mg by mouth daily. Takes at night     . ezetimibe (ZETIA) 10 MG tablet Take 1 tablet by mouth once daily for cholesterol. NEED APPOINTMENT FOR ANY MORE REFILLS 90 tablet 1  . losartan (COZAAR) 100 MG tablet TAKE 1 TABLET BY MOUTH ONCE DAILY. 30 tablet 0  . Magnesium Oxide 400 (240 Mg) MG TABS TAKE 1 TABLET BY MOUTH ONCE DAILY. 90 tablet 3  . metoprolol succinate (TOPROL-XL) 100 MG 24 hr tablet TAKE 1 TABLET BY MOUTH ONCE DAILY. 90 tablet 0  . traZODone (DESYREL) 50 MG tablet Take 1 tablet (50 mg total) by mouth at bedtime as needed. for sleep 90 tablet 0   No current facility-administered medications on file prior to visit.     BP 128/84   Pulse 70   Temp 97.8 F (36.6 C) (Temporal)   Ht 5\' 6"  (1.676 m)   Wt 179 lb 8 oz (81.4 kg)   SpO2 98%   BMI 28.97 kg/m    Objective:   Physical Exam  Constitutional: He is oriented to person, place, and time. He appears well-nourished.  HENT:  Right Ear: Tympanic membrane and ear canal normal.  Left Ear: Tympanic membrane and ear canal normal.  Mouth/Throat: Oropharynx is clear and moist.  Eyes: Pupils are equal, round, and reactive to light. EOM are normal.  Neck: Neck supple.  Cardiovascular: Normal rate and regular rhythm.  Respiratory: Effort normal and  breath sounds normal.  GI: Soft. Bowel sounds are normal. There is no abdominal tenderness.  Musculoskeletal:     Comments: Generalized decrease in ROM, walking with cane and is stable  Neurological: He is alert and oriented to  person, place, and time.  Skin: Skin is warm and dry.  Psychiatric: He has a normal mood and affect.           Assessment & Plan:

## 2019-02-07 NOTE — Assessment & Plan Note (Signed)
Following with GI annually, coloscopy bag in place and appears well.

## 2019-04-03 ENCOUNTER — Ambulatory Visit (INDEPENDENT_AMBULATORY_CARE_PROVIDER_SITE_OTHER): Payer: Medicare Other | Admitting: Primary Care

## 2019-04-03 ENCOUNTER — Encounter: Payer: Self-pay | Admitting: Primary Care

## 2019-04-03 ENCOUNTER — Other Ambulatory Visit: Payer: Self-pay

## 2019-04-03 DIAGNOSIS — H6121 Impacted cerumen, right ear: Secondary | ICD-10-CM | POA: Diagnosis not present

## 2019-04-03 DIAGNOSIS — H6120 Impacted cerumen, unspecified ear: Secondary | ICD-10-CM | POA: Insufficient documentation

## 2019-04-03 NOTE — Progress Notes (Signed)
Subjective:    Patient ID: Curtis Campos, male    DOB: 11/16/1940, 79 y.o.   MRN: [id number]  HPI  This visit occurred during the SARS-CoV-2 public health emergency.  Safety protocols were in place, including screening questions prior to the visit, additional usage of staff PPE, and extensive cleaning of exam room while observing appropriate contact time as indicated for disinfecting solutions.   Curtis Campos is a 79 year old male with a history of hypertension, CHF, CAD, COPD, rectal malignancy, tobacco abuse, chronic hyponatremia who presents today with a chief complaint of ear fullness.  The ear fullness is located to the right ear which began about 5-6 days ago. He attempted to use Debrox drops which caused increased symptoms of fullness. He does have discomfort to the right ear at times. He denies fevers, cough, sore throat.   BP Readings from Last 3 Encounters:  04/03/19 136/86  02/07/19 128/84  07/26/18 130/85     Review of Systems  Constitutional: Negative for fever.  HENT: Negative for congestion and sore throat.        Ear fullness  Respiratory: Negative for cough.   Neurological: Negative for dizziness.       Past Medical History:  Diagnosis Date  . Arthritis 03/12/2011  . CHF (congestive heart failure) ([place])   . Chronic obstructive lung disease ([place])   . Chronic systolic heart failure ([place]) 03/2013   EF of 25% with moderate to severe mitral regurgitation  . Coronary artery disease 03/2013   EF of 25%, moderate to severe mitral regurgitation. Catheterization showed normal cardiac output with mild pulmonary hypertension. There was 99% stenosis in the mid left circumflex which appeared to be chronic with akinetic basal inferior/posterior wall. Mild LAD and RCA disease.  Marland Kitchen Hyperlipidemia   . Hypertension   . Mitral regurgitation   . Rectal cancer ([place])   . Renal cancer [facility])      Social History   Socioeconomic History  . Marital status: Married    Spouse  name: Not on file  . Number of children: Not on file  . Years of education: Not on file  . Highest education level: Not on file  Occupational History  . Not on file  Tobacco Use  . Smoking status: Former Smoker    Packs/day: 1.00    Years: 50.00    Pack years: 50.00    Types: Cigarettes    Quit date: 03/25/2013    Years since quitting: 6.0  . Smokeless tobacco: Never Used  Substance and Sexual Activity  . Alcohol use: No    Alcohol/week: 0.0 standard drinks  . Drug use: No  . Sexual activity: Yes  Other Topics Concern  . Not on file  Social History Narrative  . Not on file   Social Determinants of Health   Financial Resource Strain:   . Difficulty of Paying Living Expenses: Not on file  Food Insecurity:   . Worried About Charity fundraiser in the Last Year: Not on file  . Ran Out of Food in the Last Year: Not on file  Transportation Needs:   . Lack of Transportation (Medical): Not on file  . Lack of Transportation (Non-Medical): Not on file  Physical Activity:   . Days of Exercise per Week: Not on file  . Minutes of Exercise per Session: Not on file  Stress:   . Feeling of Stress : Not on file  Social Connections:   . Frequency of Communication with  Friends and Family: Not on file  . Frequency of Social Gatherings with Friends and Family: Not on file  . Attends Religious Services: Not on file  . Active Member of Clubs or Organizations: Not on file  . Attends Archivist Meetings: Not on file  . Marital Status: Not on file  Intimate Partner Violence:   . Fear of Current or Ex-Partner: Not on file  . Emotionally Abused: Not on file  . Physically Abused: Not on file  . Sexually Abused: Not on file    Past Surgical History:  Procedure Laterality Date  . APPENDECTOMY  06/2003  . CARDIAC CATHETERIZATION  03/2013   [facility]  . cataract surgery    . COLOSTOMY    . HERNIA REPAIR    . resection of rectum      Family History  Problem Relation Age of Onset   . Heart disease Mother   . Heart disease Father   . Hyperlipidemia Father   . Hypertension Father   . Heart attack Brother 57       MI  . Heart disease Sister        stents placed   . Cancer - Cervical Sister   . Heart disease Sister        CABG    Allergies  Allergen Reactions  . Codeine Nausea Only  . Statins Other (See Comments)    REACTION: maylgias    Current Outpatient Medications on File Prior to Visit  Medication Sig Dispense Refill  . albuterol (VENTOLIN HFA) 108 (90 Base) MCG/ACT inhaler INHALE 2 PUFFS BY MOUTH EVERY 6 HOURS AS NEEDED FOR SHORTNESS OF BREATH 18 g 0  . Alirocumab (PRALUENT) 75 MG/ML SOAJ Inject 1 pen into the skin every 14 (fourteen) days. 2 pen 11  . aspirin 81 MG tablet Take 81 mg by mouth daily. Takes at night     . ezetimibe (ZETIA) 10 MG tablet Take 1 tablet by mouth once daily for cholesterol. NEED APPOINTMENT FOR ANY MORE REFILLS 90 tablet 1  . losartan (COZAAR) 100 MG tablet TAKE 1 TABLET BY MOUTH ONCE DAILY. 30 tablet 0  . Magnesium Oxide 400 (240 Mg) MG TABS TAKE 1 TABLET BY MOUTH ONCE DAILY. 90 tablet 3  . metoprolol succinate (TOPROL-XL) 100 MG 24 hr tablet TAKE 1 TABLET BY MOUTH ONCE DAILY. 90 tablet 0  . traZODone (DESYREL) 50 MG tablet Take 1 tablet (50 mg total) by mouth at bedtime as needed. for sleep 90 tablet 0   No current facility-administered medications on file prior to visit.    BP 136/86   Pulse 89   Temp (!) 96.8 F (36 C) (Temporal)   Ht 5\' 6"  (1.676 m)   Wt 185 lb 4 oz (84 kg)   SpO2 96%   BMI 29.90 kg/m    Objective:   Physical Exam  Constitutional: He appears well-nourished.  HENT:  Right canal cerumen impaction. Left TM and canal unremarkable.   Cardiovascular: Normal rate.  Murmur heard. Respiratory: Effort normal and breath sounds normal.           Assessment & Plan:

## 2019-04-03 NOTE — Patient Instructions (Signed)
It was a pleasure to see you today!   Earwax Buildup, Adult The ears produce a substance called earwax that helps keep bacteria out of the ear and protects the skin in the ear canal. Occasionally, earwax can build up in the ear and cause discomfort or hearing loss. What increases the risk? This condition is more likely to develop in people who:  Are male.  Are elderly.  Naturally produce more earwax.  Clean their ears often with cotton swabs.  Use earplugs often.  Use in-ear headphones often.  Wear hearing aids.  Have narrow ear canals.  Have earwax that is overly thick or sticky.  Have eczema.  Are dehydrated.  Have excess hair in the ear canal. What are the signs or symptoms? Symptoms of this condition include:  Reduced or muffled hearing.  A feeling of fullness in the ear or feeling that the ear is plugged.  Fluid coming from the ear.  Ear pain.  Ear itch.  Ringing in the ear.  Coughing.  An obvious piece of earwax that can be seen inside the ear canal. How is this diagnosed? This condition may be diagnosed based on:  Your symptoms.  Your medical history.  An ear exam. During the exam, your health care provider will look into your ear with an instrument called an otoscope. You may have tests, including a hearing test. How is this treated? This condition may be treated by:  Using ear drops to soften the earwax.  Having the earwax removed by a health care provider. The health care provider may: ? Flush the ear with water. ? Use an instrument that has a loop on the end (curette). ? Use a suction device.  Surgery to remove the wax buildup. This may be done in severe cases. Follow these instructions at home:   Take over-the-counter and prescription medicines only as told by your health care provider.  Do not put any objects, including cotton swabs, into your ear. You can clean the opening of your ear canal with a washcloth or facial  tissue.  Follow instructions from your health care provider about cleaning your ears. Do not over-clean your ears.  Drink enough fluid to keep your urine clear or pale yellow. This will help to thin the earwax.  Keep all follow-up visits as told by your health care provider. If earwax builds up in your ears often or if you use hearing aids, consider seeing your health care provider for routine, preventive ear cleanings. Ask your health care provider how often you should schedule your cleanings.  If you have hearing aids, clean them according to instructions from the manufacturer and your health care provider. Contact a health care provider if:  You have ear pain.  You develop a fever.  You have blood, pus, or other fluid coming from your ear.  You have hearing loss.  You have ringing in your ears that does not go away.  Your symptoms do not improve with treatment.  You feel like the room is spinning (vertigo). Summary  Earwax can build up in the ear and cause discomfort or hearing loss.  The most common symptoms of this condition include reduced or muffled hearing and a feeling of fullness in the ear or feeling that the ear is plugged.  This condition may be diagnosed based on your symptoms, your medical history, and an ear exam.  This condition may be treated by using ear drops to soften the earwax or by having the earwax removed  by a health care provider.  Do not put any objects, including cotton swabs, into your ear. You can clean the opening of your ear canal with a washcloth or facial tissue. This information is not intended to replace advice given to you by your health care provider. Make sure you discuss any questions you have with your health care provider. Document Revised: 02/26/2017 Document Reviewed: 05/27/2016 Elsevier Patient Education  2020 [employer].

## 2019-04-03 NOTE — Assessment & Plan Note (Addendum)
Evident to right canal.  Patient consented to irrigation of right canal. Moderate amount of cerumen noted. Right canal and TM post irrigation unremarkable. Discussed use of Debrox drops. Patient tolerated well.

## 2019-04-17 ENCOUNTER — Encounter: Payer: Self-pay | Admitting: Family Medicine

## 2019-04-17 ENCOUNTER — Other Ambulatory Visit: Payer: Self-pay

## 2019-04-17 ENCOUNTER — Ambulatory Visit (INDEPENDENT_AMBULATORY_CARE_PROVIDER_SITE_OTHER): Payer: Medicare Other | Admitting: Family Medicine

## 2019-04-17 DIAGNOSIS — H608X9 Other otitis externa, unspecified ear: Secondary | ICD-10-CM | POA: Diagnosis not present

## 2019-04-17 MED ORDER — OFLOXACIN 0.3 % OT SOLN: 5.0000 [drp] | Freq: Every day | OTIC | 0 refills | Status: DC

## 2019-04-17 NOTE — Progress Notes (Signed)
This visit occurred during the SARS-CoV-2 public health emergency.  Safety protocols were in place, including screening questions prior to the visit, additional usage of staff PPE, and extensive cleaning of exam room while observing appropriate contact time as indicated for disinfecting solutions.  He had R ear irrigated prev.  He has some drainage and he hears a clicking with eating.  No L ear sx.  No FCNAVD.  He feels well o/w.    He had moderna covid vaccine on 04/12/2019.  He'll get 2nd dose next month.  Done at pharmacy.   SH re: [place] service/agent orange exposure updated.   Meds, vitals, and allergies reviewed.   ROS: Per HPI unless specifically indicated in ROS section   nad ncat L TM and pinna WNL.  R pinna wnl but AOE with purulent debris in the canal and canal swelling noted.  Mastoid not ttp.  TM not seen but canal is not totally occluded.

## 2019-04-17 NOTE — Assessment & Plan Note (Addendum)
Discussed option, nontoxic, start floxin and update [place] as needed.  He agrees.  Routine cautions d/w pt.

## 2019-04-17 NOTE — Patient Instructions (Signed)
Use the floxin drops in the ear and update me if that doesn't help.  Take care.  Glad to see you.

## 2019-05-02 ENCOUNTER — Telehealth: Payer: Self-pay | Admitting: Hematology and Oncology

## 2019-05-02 NOTE — Telephone Encounter (Signed)
Scheduled per 1/28 sch msg. Called and left a msg. Mailing printout

## 2019-05-03 ENCOUNTER — Other Ambulatory Visit: Payer: Self-pay

## 2019-05-03 ENCOUNTER — Ambulatory Visit (INDEPENDENT_AMBULATORY_CARE_PROVIDER_SITE_OTHER): Payer: Medicare Other | Admitting: Nurse Practitioner

## 2019-05-03 ENCOUNTER — Encounter: Payer: Self-pay | Admitting: Nurse Practitioner

## 2019-05-03 VITALS — BP 150/82 | HR 79 | Ht 66.0 in | Wt 184.2 lb

## 2019-05-03 DIAGNOSIS — I340 Nonrheumatic mitral (valve) insufficiency: Secondary | ICD-10-CM | POA: Diagnosis not present

## 2019-05-03 DIAGNOSIS — E785 Hyperlipidemia, unspecified: Secondary | ICD-10-CM | POA: Diagnosis not present

## 2019-05-03 DIAGNOSIS — I350 Nonrheumatic aortic (valve) stenosis: Secondary | ICD-10-CM

## 2019-05-03 DIAGNOSIS — I10 Essential (primary) hypertension: Secondary | ICD-10-CM

## 2019-05-03 DIAGNOSIS — I2510 Atherosclerotic heart disease of native coronary artery without angina pectoris: Secondary | ICD-10-CM

## 2019-05-03 DIAGNOSIS — I255 Ischemic cardiomyopathy: Secondary | ICD-10-CM

## 2019-05-03 DIAGNOSIS — I5020 Unspecified systolic (congestive) heart failure: Secondary | ICD-10-CM

## 2019-05-03 MED ORDER — CARVEDILOL 12.5 MG PO TABS: 12.5000 mg | ORAL_TABLET | Freq: Two times a day (BID) | ORAL | 3 refills | Status: DC

## 2019-05-03 NOTE — Patient Instructions (Signed)
Medication Instructions:  1- STOP Toprol  2- START Coreg Take 1 tablet (12.5 mg total) by mouth 2 (two) times daily *If you need a refill on your cardiac medications before your next appointment, please call your pharmacy*  Lab Work: None ordered  If you have labs (blood work) drawn today and your tests are completely normal, you will receive your results only by: Marland Kitchen MyChart Message (if you have MyChart) OR . A paper copy in the mail If you have any lab test that is abnormal or we need to change your treatment, we will call you to review the results.  Testing/Procedures: 1- Echo  Please return to [facility] on ______________ at _______________ AM/PM for an Echocardiogram. Your physician has requested that you have an echocardiogram. Echocardiography is a painless test that uses sound waves to create images of your heart. It provides your doctor with information about the size and shape of your heart and how well your heart's chambers and valves are working. This procedure takes approximately one hour. There are no restrictions for this procedure. Please note; depending on visual quality an IV may need to be placed.    Follow-Up: At [facility], you and your health needs are our priority.  As part of our continuing mission to provide you with exceptional heart care, we have created designated Provider Care Teams.  These Care Teams include your primary Cardiologist (physician) and Advanced Practice Providers (APPs -  Physician Assistants and Nurse Practitioners) who all work together to provide you with the care you need, when you need it.  Your next appointment:   6 month(s)  The format for your next appointment:   In Person  Provider:    You may see Kathlyn Sacramento, MD or Murray Hodgkins, NP.   Other Instructions  How to use a home blood pressure monitor. . Be still. Don't smoke, drink caffeinated beverages or exercise within 30 minutes before measuring your blood  pressure. . Sit correctly. Sit with your back straight and supported (on a dining chair, rather than a sofa). Your feet should be flat on the floor and your legs should not be crossed. Your arm should be supported on a flat surface (such as a table) with the upper arm at heart level. Make sure the bottom of the cuff is placed directly above the bend of the elbow.  . Measure at the same time every day. It's important to take the readings at the same time each day, such as morning and evening. Take reading approximately 1 hour after BP medications.

## 2019-05-03 NOTE — Progress Notes (Signed)
Office Visit    Patient Name: Curtis Campos Date of Encounter: 05/03/2019  Primary Care Provider:  [name], NP Primary Cardiologist:  Kathlyn Sacramento, MD  Chief Complaint    79 year old male with a history of coronary artery disease, ischemic cardiomyopathy, HFrEF, moderate aortic stenosis, moderate mitral regurgitation, hypertension, hyperlipidemia with statin intolerance, remote tobacco abuse, and COPD, who presents for follow-up of heart failure.  Past Medical History    Past Medical History:  Diagnosis Date  . Agent orange exposure   . Arthritis 03/12/2011  . Chronic obstructive lung disease ([place])   . Coronary artery disease 03/2013   a. 03/2013 Cath: LM nl, LAD 41m, D1 50, D2 30, LCX 17m/d (chronic), OM1 50, RCA 40p, 81m, RPL 40, RPDA 60, EF 25%, mod-sev MR-->Med mgmt.  . HFrEF (heart failure with reduced ejection fraction) ([place])    a. 03/2013 TEE: EF 25%; b. 08/2017 Echo: EF 45-50%, Gr1 DD. Mod AS. Mod MR. Mildly dil LA.   Marland Kitchen Hyperlipidemia   . Hypertension   . Ischemic cardiomyopathy    a. 03/2013 TEE: EF 25%; b. 08/2017 Echo: EF 45-50%.  . Moderate aortic stenosis    a. 08/2017 Echo: Mod AS. Mean grad (S) 34mmHg. Valve area (VTI): 1.25cm^2.  . Moderate mitral regurgitation   . Rectal cancer [facility])    Past Surgical History:  Procedure Laterality Date  . APPENDECTOMY  06/2003  . CARDIAC CATHETERIZATION  03/2013   [facility]  . cataract surgery    . COLOSTOMY    . HERNIA REPAIR    . resection of rectum      Allergies  Allergies  Allergen Reactions  . Codeine Nausea Only  . Statins Other (See Comments)    REACTION: maylgias    History of Present Illness    79 year old male with the above complex past medical history including HFrEF, ischemic cardiomyopathy, moderate aortic stenosis and moderate mitral regurgitation, hypertension, hyperlipidemia with statin intolerance, prior rectal cancer, remote tobacco abuse, and COPD.  In January 2015, he was diagnosed  with a cardiomyopathy after echocardiogram showed an EF of 25% with moderate mitral regurgitation.  Bilateral cardiac catheterization showed severe one-vessel CAD with a subtotal occlusion of the mid left circumflex, which appeared to be chronic.  He otherwise had moderate, nonobstructive disease.    There was concern for moderate to severe mitral regurgitation however his follow-up transesophageal echocardiogram showed only moderate mitral regurgitation.  This being the case, recommendation was made for medical therapy of his coronary artery disease.  He has been undergoing echocardiogram every 2 years with improvement of LV function by most recent echo in June 2019-45-50%.  Valvular heart disease has remained stable (moderate AS/moderate MR).  He was last seen via telemedicine visit in April 2020, at which time he was doing well.  He has continued to do well over the past 9 months.  He has some degree of chronic stable dyspnea on exertion which is unchanged.  He denies chest pain, palpitations, PND, orthopnea, dizziness, syncope, edema, or early satiety.  He has been less active in the setting of COVID-19 but is still able to perform usual chores without much difficulty.  He and his wife have already received their first dose of the Moderna Covid vaccine with plan for the second dose on February 10.  Home Medications    Prior to Admission medications   Medication Sig Start Date End Date Taking? Authorizing Provider  albuterol (VENTOLIN HFA) 108 (90 Base) MCG/ACT inhaler  INHALE 2 PUFFS BY MOUTH EVERY 6 HOURS AS NEEDED FOR SHORTNESS OF BREATH 08/29/18   [name], NP  Alirocumab (PRALUENT) 75 MG/ML SOAJ Inject 1 pen into the skin every 14 (fourteen) days. 08/25/18   Wellington Hampshire, MD  aspirin 81 MG tablet Take 81 mg by mouth daily. Takes at night     [provider]  ezetimibe (ZETIA) 10 MG tablet Take 1 tablet by mouth once daily for cholesterol. NEED APPOINTMENT FOR ANY MORE REFILLS  08/29/18   [name], NP  losartan (COZAAR) 100 MG tablet TAKE 1 TABLET BY MOUTH ONCE DAILY. 03/21/18   Wellington Hampshire, MD  Magnesium Oxide 400 (240 Mg) MG TABS TAKE 1 TABLET BY MOUTH ONCE DAILY. 05/03/17   Wellington Hampshire, MD  metoprolol succinate (TOPROL-XL) 100 MG 24 hr tablet TAKE 1 TABLET BY MOUTH ONCE DAILY. 10/17/18   Wellington Hampshire, MD  ofloxacin (FLOXIN OTIC) 0.3 % OTIC solution Place 5 drops into the right ear daily. 04/17/19   Tonia Ghent, MD  traZODone (DESYREL) 50 MG tablet Take 1 tablet (50 mg total) by mouth at bedtime as needed. for sleep 08/29/18   [name], NP    Review of Systems    Some degree of chronic, stable dyspnea exertion.  He denies chest pain, palpitations, pnd, orthopnea, n, v, dizziness, syncope, edema, weight gain, or early satiety.  All other systems reviewed and are otherwise negative except as noted above.  Physical Exam    VS:  BP (!) 150/82 (BP Location: Left Arm, Patient Position: Sitting, Cuff Size: Normal)   Pulse 79   Ht 5\' 6"  (1.676 m)   Wt 184 lb 4 oz (83.6 kg)   BMI 29.74 kg/m  , BMI Body mass index is 29.74 kg/m. GEN: Well nourished, well developed, in no acute distress. HEENT: normal. Neck: Supple, no JVD, carotid bruits, or masses. Cardiac: RRR, 2/6 SEM @ RUSB, 1/6 syst murmur @ apex, no rubs, or gallops. No clubbing, cyanosis, edema.  Radials/PT 2+ and equal bilaterally.  Respiratory:  Respirations regular and unlabored, diminished @ bilat bases, otw CTA. GI: Soft, nontender, nondistended, BS + x 4. MS: no deformity or atrophy. Skin: warm and dry, no rash. Neuro:  Strength and sensation are intact. Psych: Normal affect.  Accessory Clinical Findings    ECG personally reviewed by me today -sinus arrhythmia, 79, borderline LVH with nonspecific ST/T changes- no acute changes.  Lab Results  Component Value Date   WBC 7.2 01/31/2019   HGB 12.4 (L) 01/31/2019   HCT 37.7 (L) 01/31/2019   MCV 93.1 01/31/2019    PLT 293.0 01/31/2019   Lab Results  Component Value Date   CREATININE 0.90 01/31/2019   BUN 15 01/31/2019   NA 134 (L) 01/31/2019   K 4.2 01/31/2019   CL 99 01/31/2019   CO2 28 01/31/2019   Lab Results  Component Value Date   ALT 11 01/31/2019   AST 14 01/31/2019   ALKPHOS 56 01/31/2019   BILITOT 1.0 01/31/2019   Lab Results  Component Value Date   CHOL 163 01/31/2019   HDL 56.70 01/31/2019   LDLCALC 85 01/31/2019   LDLDIRECT 129.7 04/14/2012   TRIG 105.0 01/31/2019   CHOLHDL 3 01/31/2019    Lab Results  Component Value Date   HGBA1C 5.7 01/31/2019    Assessment & Plan    1.  Coronary artery disease: Status post catheterization in January 2015 revealing a 99% subtotal  chronic occlusion of the left circumflex and otherwise nonobstructive disease.  He has been doing well without chest pain.  He does have chronic, stable dyspnea on exertion.  He remains on aspirin, Zetia, and beta-blocker therapy.  In the setting of elevated blood pressure, I am going to switch him from Toprol-XL to carvedilol 12.5 mg twice daily.  2.  Ischemic cardiomyopathy/HFrEF: Euvolemic on examination and overall stable.  Switching beta-blocker from Toprol-XL to carvedilol for improved blood pressure management.  Continue losartan therapy.  3.  Moderate aortic stenosis/moderate mitral regurgitation: As above, chronic stable dyspnea on exertion.  No chest pain, presyncope, or syncope.  This is been followed by echo roughly every 2 years.  I will put in orders for an echo and he will schedule this in the next couple months.  He would prefer to wait until Covid numbers have improved further which I think is reasonable.  Continue beta-blocker therapy as outlined above.  4.  Essential hypertension: Blood pressure elevated at 150/82 today.  He says he has seen similar numbers at home.  Switching from Toprol-XL to carvedilol for improved blood pressure management.  His wife is going to check his blood pressure  daily over the next 1 to 2 weeks and will contact us if he continues to trend over 130, at which point we can titrate carvedilol further, so long as heart rate allows.  Otherwise, could consider amlodipine.  5.  Hyperlipidemia: He is statin intolerant with an LDL of 85 and Zetia and Repatha.  6.  Disposition: Follow-up valvular heart disease with echocardiogram to be scheduled at patient's convenience.  Follow-up in clinic in 6 months or sooner if necessary.  Patient will contact us regarding blood pressure recordings.  Murray Hodgkins, NP 05/03/2019, 3:25 PM

## 2019-06-12 ENCOUNTER — Telehealth: Payer: Self-pay | Admitting: Cardiovascular Disease

## 2019-06-12 NOTE — Telephone Encounter (Signed)
Spoke with patient in regards to his call earlier this morning. He states that he is SOB but this SOB is unchanged from his baseline and is stable. His main concern is the swelling in his feet and ankles.   He states that he has not changed his diet and has not been consuming a large amount of salt. He also states that he does not wear any compression clothing. I advised him that the compression clothing would be beneficial to him andvadvised him to monitor his salt intake.   I am routing this message to Sharolyn Douglas, NP for further review and advice.

## 2019-06-12 NOTE — Telephone Encounter (Signed)
Appt sch with Ignacia Bayley, NP on 06/14/19

## 2019-06-12 NOTE — Telephone Encounter (Signed)
Routing to Scheduling to schedule an office visit and to try to move up the echo per Sharolyn Douglas, NP.

## 2019-06-12 NOTE — Telephone Encounter (Signed)
New message   Pt c/o swelling: STAT is pt has developed SOB within 24 hours  How much weight have you gained and in what time span?patient's wife states that swelling started Friday, no weight gaine  1) If swelling, where is the swelling located? Both feet and ankles   2) Are you currently taking a fluid pill? No   3) Are you currently SOB?yes   4) Do you have a log of your daily weights (if so, list)? Yes   5) Have you gained 3 pounds in a day or 5 pounds in a week? No   6) Have you traveled recently?no

## 2019-06-12 NOTE — Telephone Encounter (Signed)
I think he would be best served by an office visit this week. He already has an order in place for an echo - perhaps this could be moved up in scheduling.  In the interim, I agree re: compression socks and keeping legs elevated.

## 2019-06-14 ENCOUNTER — Encounter: Payer: Self-pay | Admitting: Nurse Practitioner

## 2019-06-14 ENCOUNTER — Other Ambulatory Visit: Payer: Self-pay

## 2019-06-14 ENCOUNTER — Ambulatory Visit (INDEPENDENT_AMBULATORY_CARE_PROVIDER_SITE_OTHER): Payer: Medicare Other | Admitting: Nurse Practitioner

## 2019-06-14 VITALS — BP 122/80 | HR 68 | Ht 66.0 in | Wt 182.1 lb

## 2019-06-14 DIAGNOSIS — I5020 Unspecified systolic (congestive) heart failure: Secondary | ICD-10-CM

## 2019-06-14 DIAGNOSIS — I255 Ischemic cardiomyopathy: Secondary | ICD-10-CM

## 2019-06-14 DIAGNOSIS — I38 Endocarditis, valve unspecified: Secondary | ICD-10-CM

## 2019-06-14 DIAGNOSIS — I2510 Atherosclerotic heart disease of native coronary artery without angina pectoris: Secondary | ICD-10-CM | POA: Diagnosis not present

## 2019-06-14 NOTE — Progress Notes (Addendum)
Office Visit    Patient Name: Curtis Campos Date of Encounter: 06/14/2019  Primary Care Provider:  [name], NP Primary Cardiologist:  Kathlyn Sacramento, MD  Chief Complaint    79 year old male with a history of coronary artery disease, ischemic cardiomyopathy, HFrEF, moderate aortic stenosis, moderate mitral regurgitation, hypertension, hyperlipidemia with statin intolerance, remote tobacco abuse, and COPD, who presents for follow-up of heart failure.  Past Medical History    Past Medical History:  Diagnosis Date   Agent orange exposure    Arthritis 03/12/2011   Chronic obstructive lung disease ([place])    Coronary artery disease 03/2013   a. 03/2013 Cath: LM nl, LAD 34m, D1 50, D2 30, LCX 62m/d (chronic), OM1 50, RCA 40p, 22m, RPL 40, RPDA 60, EF 25%, mod-sev MR-->Med mgmt.   HFrEF (heart failure with reduced ejection fraction) ([place])    a. 03/2013 TEE: EF 25%; b. 08/2017 Echo: EF 45-50%, Gr1 DD. Mod AS. Mod MR. Mildly dil LA.    Hyperlipidemia    Hypertension    Ischemic cardiomyopathy    a. 03/2013 TEE: EF 25%; b. 08/2017 Echo: EF 45-50%.   Moderate aortic stenosis    a. 08/2017 Echo: Mod AS. Mean grad (S) 7mmHg. Valve area (VTI): 1.25cm^2.   Moderate mitral regurgitation    Rectal cancer [facility])    Past Surgical History:  Procedure Laterality Date   APPENDECTOMY  06/2003   CARDIAC CATHETERIZATION  03/2013   [facility]   cataract surgery     COLOSTOMY     HERNIA REPAIR     resection of rectum      Allergies  Allergies  Allergen Reactions   Codeine Nausea Only   Statins Other (See Comments)    REACTION: maylgias    History of Present Illness    79 year old male with above complex past medical history including HFrEF, ischemic cardiomyopathy, moderate aortic stenosis and moderate mitral regurgitation, hypertension, hyperlipidemia with statin intolerance, prior rectal cancer, remote tobacco abuse, and COPD.  In January 2015, he was diagnosed with  a cardiomyopathy after echocardiogram showed an EF of 25% with moderate mitral digitation.  Diagnostic cardiac catheterization showed severe one-vessel CAD with subtotal occlusion of the mid left circumflex, which appeared to be chronic.  He otherwise had moderate, nonobstructive disease.  There was concern for moderate to severe mitral vegetation however his follow-up transesophageal echocardiogram showed only moderate mitral vegetation.  This being the case, recommendation was made for medical therapy of his coronary artery disease.  He has been undergoing echocardiograms every 2 years with improvement of LV function by most recent echo in June 2000 19-45-50%.  Valvular heart disease has remained stable with moderate AS and MR.  He was last seen in clinic in February, at which time he was doing well.  He noted some degree of chronic stable dyspnea on exertion which she reported as being unchanged.  I did switch him from metoprolol to carvedilol for better blood pressure control.  His wife noted on March 14, that he was having some swelling in his ankles in the evening.  As result, they called the office and an appointment was made for him today.  He says that he has noted similar swelling before and that it is never present first thing in the morning.  Since the episode on March 14, he has been doing a better job keeping his legs elevated.  Both he and his wife say he sits most of the day.  Activity has  been limited in the setting of hip pain for which she was receiving physical therapy prior to the pandemic but has not received since.  His weight has been stable and he denies chest pain, PND, orthopnea, dizziness, syncope, or early satiety.  In discussing his stable dyspnea on exertion further, he notes that he was fairly active at work prior to retiring in March 2020 and since then, his dyspnea may have actually progressed some in the setting of inactivity.  Home Medications    Prior to Admission  medications   Medication Sig Start Date End Date Taking? Authorizing Provider  albuterol (VENTOLIN HFA) 108 (90 Base) MCG/ACT inhaler INHALE 2 PUFFS BY MOUTH EVERY 6 HOURS AS NEEDED FOR SHORTNESS OF BREATH 08/29/18  Yes [name], NP  Alirocumab (PRALUENT) 75 MG/ML SOAJ Inject 1 pen into the skin every 14 (fourteen) days. 08/25/18  Yes Wellington Hampshire, MD  aspirin 81 MG tablet Take 81 mg by mouth daily. Takes at night    Yes [provider]  carvedilol (COREG) 12.5 MG tablet Take 1 tablet (12.5 mg total) by mouth 2 (two) times daily. 05/03/19 08/01/19 Yes Theora Gianotti, NP  ezetimibe (ZETIA) 10 MG tablet Take 1 tablet by mouth once daily for cholesterol. NEED APPOINTMENT FOR ANY MORE REFILLS 08/29/18  Yes [name], NP  losartan (COZAAR) 100 MG tablet TAKE 1 TABLET BY MOUTH ONCE DAILY. 03/21/18  Yes Wellington Hampshire, MD  Magnesium Oxide 400 (240 Mg) MG TABS TAKE 1 TABLET BY MOUTH ONCE DAILY. 05/03/17  Yes Wellington Hampshire, MD  traZODone (DESYREL) 50 MG tablet Take 1 tablet (50 mg total) by mouth at bedtime as needed. for sleep 08/29/18  Yes [name], NP    Review of Systems    He notes dyspnea on exertion after walking about 20 yards.  He initially says this is stable over the past year but then notes that he was not experiencing dyspnea prior to his retirement in March 2020.  Dependent lower extremity edema recently noted in the evening and this has improved significantly with keeping his legs elevated during the day.  He denies chest pain, palpitations, PND, orthopnea, dizziness, syncope, or early satiety.  All other systems reviewed and are otherwise negative except as noted above.  Physical Exam    VS:  BP 122/80 (BP Location: Left Arm, Patient Position: Sitting, Cuff Size: Normal)    Pulse 68    Ht 5\' 6"  (1.676 m)    Wt 182 lb 2 oz (82.6 kg)    SpO2 95%    BMI 29.40 kg/m  , BMI Body mass index is 29.4 kg/m.  ReDS Vest equals 27%. GEN: Well nourished,  well developed, in no acute distress. HEENT: normal. Neck: Supple, no JVD, carotid bruits, or masses. Cardiac: Irregular, 2/6 systolic ejection murmur the right upper sternal border with a 1/6 systolic murmur at the apex, no rubs, or gallops. No clubbing, cyanosis, trace bilateral ankle edema.  Radials/PT 2+ and equal bilaterally.  Respiratory:  Respirations regular and unlabored, diminished breath sounds bilaterally. GI: Soft, nontender, nondistended, BS + x 4. MS: no deformity or atrophy. Skin: warm and dry, no rash. Neuro:  Strength and sensation are intact. Psych: Normal affect.  Accessory Clinical Findings    ECG personally reviewed by me today -sinus arrhythmia, 68, nonspecific T changes - no acute changes.  Lab Results  Component Value Date   WBC 7.2 01/31/2019   HGB 12.4 (L) 01/31/2019  HCT 37.7 (L) 01/31/2019   MCV 93.1 01/31/2019   PLT 293.0 01/31/2019   Lab Results  Component Value Date   CREATININE 0.90 01/31/2019   BUN 15 01/31/2019   NA 134 (L) 01/31/2019   K 4.2 01/31/2019   CL 99 01/31/2019   CO2 28 01/31/2019   Lab Results  Component Value Date   ALT 11 01/31/2019   AST 14 01/31/2019   ALKPHOS 56 01/31/2019   BILITOT 1.0 01/31/2019   Lab Results  Component Value Date   CHOL 163 01/31/2019   HDL 56.70 01/31/2019   LDLCALC 85 01/31/2019   LDLDIRECT 129.7 04/14/2012   TRIG 105.0 01/31/2019   CHOLHDL 3 01/31/2019    Lab Results  Component Value Date   HGBA1C 5.7 01/31/2019    Assessment & Plan    1.  HFrEF/ischemic cardiomyopathy: EF 45 to 50% by echo in June 2019.  I saw him in February, at which time he reported doing well and was euvolemic on examination.  He and his wife recently noticed mild ankle edema in the evenings.  For that reason they scheduled this appointment today.  Edema has more or less resolved in the setting of keeping his legs elevated over the past few days.  With the exception of only trace bimalleolar edema, he is euvolemic  on examination today and his ReDS Vest reading is normal at 27%.  When he was seen in February, I had arranged for a follow-up echocardiogram to reevaluate his aortic stenosis and mitral regurgitation, which he planned to have done later in the spring or early summer.  As he now says that though he has somewhat stable dyspnea on exertion, it has progressed since last March, I advised that we scheduled a sooner and he will do so.  I stressed the importance of watching sodium intake and also in the setting of inactivity, keeping his legs elevated while sitting and potentially wearing either a tighter man sock or compression sock.  He remains on carvedilol and losartan therapy.  No role for diuretic at this time.  2.  Moderate aortic stenosis/moderate mitral regurgitation: As above, follow-up echocardiogram sooner rather than later.  3.  Coronary artery disease: Known subtotal occlusion of the left circumflex and otherwise nonobstructive disease by catheterization 2015.  He has been doing well without chest pain.  He remains on aspirin, Zetia, Repatha and beta-blocker therapy.  4.  Essential hypertension: Blood pressure improved after switching from metoprolol to carvedilol.  122/80 today.  Continue losartan as well.  5.  Hyperlipidemia: Statin intolerant with an LDL of 85 on Zetia and Repatha in November 2020.  LFTs normal at that time.  6.  Disposition: Follow-up echocardiogram within the next few weeks.  Follow-up in clinic in 3 months or sooner if necessary.  Murray Hodgkins, NP 06/14/2019, 3:28 PM

## 2019-06-14 NOTE — Patient Instructions (Signed)
Medication Instructions:  Your physician recommends that you continue on your current medications as directed. Please refer to the Current Medication list given to you today.  *If you need a refill on your cardiac medications before your next appointment, please call your pharmacy*   Lab Work: None ordered  If you have labs (blood work) drawn today and your tests are completely normal, you will receive your results only by: Marland Kitchen MyChart Message (if you have MyChart) OR . A paper copy in the mail If you have any lab test that is abnormal or we need to change your treatment, we will call you to review the results.   Testing/Procedures: - Please schedule your ECHO that is already ordered.    Follow-Up: At [facility], you and your health needs are our priority.  As part of our continuing mission to provide you with exceptional heart care, we have created designated Provider Care Teams.  These Care Teams include your primary Cardiologist (physician) and Advanced Practice Providers (APPs -  Physician Assistants and Nurse Practitioners) who all work together to provide you with the care you need, when you need it.  We recommend signing up for the patient portal called "MyChart".  Sign up information is provided on this After Visit Summary.  MyChart is used to connect with patients for Virtual Visits (Telemedicine).  Patients are able to view lab/test results, encounter notes, upcoming appointments, etc.  Non-urgent messages can be sent to your provider as well.   To learn more about what you can do with MyChart, go to [email or web address].    Your next appointment:   3 month(s)  The format for your next appointment:   In Person  Provider:    You may see Kathlyn Sacramento, MD or Murray Hodgkins, NP.    Edema  Edema is when you have too much fluid in your body or under your skin. Edema may make your legs, feet, and ankles swell up. Swelling is also common in looser tissues, like around  your eyes. This is a common condition. It gets more common as you get older. There are many possible causes of edema. Eating too much salt (sodium) and being on your feet or sitting for a long time can cause edema in your legs, feet, and ankles. Hot weather may make edema worse. Edema is usually painless. Your skin may look swollen or shiny. Follow these instructions at home:  Keep the swollen body part raised (elevated) above the level of your heart when you are sitting or lying down.  Do not sit still or stand for a long time.  Do not wear tight clothes. Do not wear garters on your upper legs.  Exercise your legs. This can help the swelling go down.  Wear elastic bandages or support stockings as told by your doctor.  Eat a low-salt (low-sodium) diet to reduce fluid as told by your doctor.  Depending on the cause of your swelling, you may need to limit how much fluid you drink (fluid restriction).  Take over-the-counter and prescription medicines only as told by your doctor. Contact a doctor if:  Treatment is not working.  You have heart, liver, or kidney disease and have symptoms of edema.  You have sudden and unexplained weight gain. Get help right away if:  You have shortness of breath or chest pain.  You cannot breathe when you lie down.  You have pain, redness, or warmth in the swollen areas.  You have heart, liver, or kidney disease  and get edema all of a sudden.  You have a fever and your symptoms get worse all of a sudden. Summary  Edema is when you have too much fluid in your body or under your skin.  Edema may make your legs, feet, and ankles swell up. Swelling is also common in looser tissues, like around your eyes.  Raise (elevate) the swollen body part above the level of your heart when you are sitting or lying down.  Follow your doctor's instructions about diet and how much fluid you can drink (fluid restriction). This information is not intended to  replace advice given to you by your health care provider. Make sure you discuss any questions you have with your health care provider. Document Revised: 03/19/2017 Document Reviewed: 04/03/2016 Elsevier Patient Education  2020 [employer].

## 2019-06-15 ENCOUNTER — Inpatient Hospital Stay: Payer: Medicare Other | Admitting: Hematology and Oncology

## 2019-07-05 DIAGNOSIS — Z933 Colostomy status: Secondary | ICD-10-CM | POA: Diagnosis not present

## 2019-07-05 DIAGNOSIS — Z433 Encounter for attention to colostomy: Secondary | ICD-10-CM | POA: Diagnosis not present

## 2019-07-13 ENCOUNTER — Ambulatory Visit (INDEPENDENT_AMBULATORY_CARE_PROVIDER_SITE_OTHER): Payer: Medicare Other

## 2019-07-13 ENCOUNTER — Other Ambulatory Visit: Payer: Self-pay

## 2019-07-13 DIAGNOSIS — I350 Nonrheumatic aortic (valve) stenosis: Secondary | ICD-10-CM | POA: Diagnosis not present

## 2019-07-17 ENCOUNTER — Telehealth: Payer: Self-pay

## 2019-07-17 NOTE — Telephone Encounter (Signed)
Call to review echo results.   Reviewed s sx to watch out for with worsening heart failure. He will watch sodium and will take weights daily. appt made for 5/20.   Spoke with wife as well to confirm POC.   No new orders.   Advised pt to call for any further questions or concerns.

## 2019-07-17 NOTE — Telephone Encounter (Signed)
-----   Message from Theora Gianotti, NP sent at 07/17/2019  8:53 AM EDT ----- Heart squeezing is reduced compared to 2019 - was 45-50%, now 30-35%.  The aortic valve remains moderately stenosed.  The mitral valve remains mildly to moderately leaky.  Pressures on the right side of the heart were moderately elevated, possibly indicating excess fluid, though fluid status looked good @ last office visit.  I am concerned about his recent lower extremity swelling and finding of reduced heart squeezing function.  He doesn't have f/u with Dr. Fletcher Anon until July, and given findings on echo, I'd like him to be seen within the next month or so (sooner if he's having more swelling or edema).

## 2019-08-07 ENCOUNTER — Telehealth: Payer: Self-pay | Admitting: Cardiovascular Disease

## 2019-08-07 MED ORDER — CARVEDILOL 12.5 MG PO TABS: 12.5000 mg | ORAL_TABLET | Freq: Two times a day (BID) | ORAL | 3 refills | Status: DC

## 2019-08-07 NOTE — Telephone Encounter (Signed)
°*  STAT* If patient is at the pharmacy, call can be transferred to refill team.   1. Which medications need to be refilled? (please list name of each medication and dose if known) carvedilol 12.5 MG  2. Which pharmacy/location (including street and city if local pharmacy) is medication to be sent to? [facility]  3. Do they need a 30 day or 90 day supply? 90 day

## 2019-08-17 ENCOUNTER — Ambulatory Visit (INDEPENDENT_AMBULATORY_CARE_PROVIDER_SITE_OTHER): Payer: Medicare Other | Admitting: Nurse Practitioner

## 2019-08-17 ENCOUNTER — Other Ambulatory Visit: Payer: Self-pay

## 2019-08-17 ENCOUNTER — Encounter: Payer: Self-pay | Admitting: Nurse Practitioner

## 2019-08-17 VITALS — BP 142/80 | HR 82 | Ht 66.0 in | Wt 178.5 lb

## 2019-08-17 DIAGNOSIS — I5020 Unspecified systolic (congestive) heart failure: Secondary | ICD-10-CM

## 2019-08-17 DIAGNOSIS — I10 Essential (primary) hypertension: Secondary | ICD-10-CM

## 2019-08-17 DIAGNOSIS — I2510 Atherosclerotic heart disease of native coronary artery without angina pectoris: Secondary | ICD-10-CM | POA: Diagnosis not present

## 2019-08-17 DIAGNOSIS — I350 Nonrheumatic aortic (valve) stenosis: Secondary | ICD-10-CM

## 2019-08-17 DIAGNOSIS — E785 Hyperlipidemia, unspecified: Secondary | ICD-10-CM

## 2019-08-17 DIAGNOSIS — I255 Ischemic cardiomyopathy: Secondary | ICD-10-CM | POA: Diagnosis not present

## 2019-08-17 MED ORDER — FUROSEMIDE 20 MG PO TABS: 20.0000 mg | ORAL_TABLET | Freq: Every day | ORAL | 3 refills | Status: DC

## 2019-08-17 NOTE — Patient Instructions (Signed)
Medication Instructions:  1- START Lasix Take 1 tablet (20 mg total) by mouth daily *If you need a refill on your cardiac medications before your next appointment, please call your pharmacy*   Lab Work: Your physician recommends that you return for lab work in: 1 week or less at the medical mall.  No appt is needed. Hours are M-F 7AM- 6 PM.  If you have labs (blood work) drawn today and your tests are completely normal, you will receive your results only by: Marland Kitchen MyChart Message (if you have MyChart) OR . A paper copy in the mail If you have any lab test that is abnormal or we need to change your treatment, we will call you to review the results.   Testing/Procedures: 1- [place]  Your caregiver has ordered a Stress Test with nuclear imaging. The purpose of this test is to evaluate the blood supply to your heart muscle. This procedure is referred to as a "Non-Invasive Stress Test." This is because other than having an IV started in your vein, nothing is inserted or "invades" your body. Cardiac stress tests are done to find areas of poor blood flow to the heart by determining the extent of coronary artery disease (CAD). Some patients exercise on a treadmill, which naturally increases the blood flow to your heart, while others who are  unable to walk on a treadmill due to physical limitations have a pharmacologic/chemical stress agent called Lexiscan . This medicine will mimic walking on a treadmill by temporarily increasing your coronary blood flow.   Please note: these test may take anywhere between 2-4 hours to complete  PLEASE REPORT TO [facility] AT THE FIRST DESK WILL DIRECT YOU WHERE TO GO  Date of Procedure:_____________________________________  Arrival Time for Procedure:______________________________  Instructions regarding medication:    __x__:  Hold betablocker(s) night before procedure and morning of procedure (carvediolol)  __x__:  Hold  other medications as follows:_________Hold lasix the morning of _________________  PLEASE NOTIFY THE OFFICE AT LEAST 24 HOURS IN ADVANCE IF YOU ARE UNABLE TO KEEP YOUR APPOINTMENT.  [phone] AND  PLEASE NOTIFY NUCLEAR MEDICINE AT [facility] AT LEAST 24 HOURS IN ADVANCE IF YOU ARE UNABLE TO KEEP YOUR APPOINTMENT. [phone]  How to prepare for your Myoview test:  1. Do not eat or drink after midnight 2. No caffeine for 24 hours prior to test 3. No smoking 24 hours prior to test. 4. Your medication may be taken with water.  If your doctor stopped a medication because of this test, do not take that medication. 5. Ladies, please do not wear dresses.  Skirts or pants are appropriate. Please wear a short sleeve shirt. 6. No perfume, cologne or lotion. 7. Wear comfortable walking shoes. No heels!   Follow-Up: At [facility], you and your health needs are our priority.  As part of our continuing mission to provide you with exceptional heart care, we have created designated Provider Care Teams.  These Care Teams include your primary Cardiologist (physician) and Advanced Practice Providers (APPs -  Physician Assistants and Nurse Practitioners) who all work together to provide you with the care you need, when you need it.  We recommend signing up for the patient portal called "MyChart".  Sign up information is provided on this After Visit Summary.  MyChart is used to connect with patients for Virtual Visits (Telemedicine).  Patients are able to view lab/test results, encounter notes, upcoming appointments, etc.  Non-urgent messages can be sent  to your provider as well.   To learn more about what you can do with MyChart, go to [email or web address].    Your next appointment:   2 week(s)  The format for your next appointment:   In Person  Provider:    You may see Kathlyn Sacramento, MD or Murray Hodgkins, NP

## 2019-08-17 NOTE — Progress Notes (Signed)
Office Visit    Patient Name: Curtis Campos Date of Encounter: 08/17/2019  Primary Care Provider:  [name], NP Primary Cardiologist:  Kathlyn Sacramento, MD  Chief Complaint    79 year old male with a history of CAD, ischemic cardiomyopathy, HFrEF, moderate aortic stenosis, moderate mitral regurgitation, hypertension, hyperlipidemia with statin intolerance, remote tobacco abuse, and COPD, who presents for heart failure follow-up.  Past Medical History    Past Medical History:  Diagnosis Date  . Agent orange exposure   . Arthritis 03/12/2011  . Chronic obstructive lung disease ([place])   . Coronary artery disease 03/2013   a. 03/2013 Cath: LM nl, LAD 66m, D1 50, D2 30, LCX 70m/d (chronic), OM1 50, RCA 40p, 26m, RPL 40, RPDA 60, EF 25%, mod-sev MR-->Med mgmt.  . HFrEF (heart failure with reduced ejection fraction) ([place])    a. 03/2013 TEE: EF 25%; b. 08/2017 Echo: EF 45-50%, Gr1 DD. Mod AS. Mod MR. Mildly dil LA; c. 06/2019 Echo: EF 30-35%, glob HK, gr1 DD, RVSP 49.59mmHg. Mildly dil LA. Mod AS (VTI 1.16cm^2, mean grad 42mmHg).  . Hyperlipidemia   . Hypertension   . Ischemic cardiomyopathy    a. 03/2013 TEE: EF 25%; b. 08/2017 Echo: EF 45-50%; c. 06/2019 Echo: EF 30-35%.  . Moderate aortic stenosis    a. 08/2017 Echo: Mod AS. Mean grad (S) 23mmHg. Valve area (VTI): 1.25cm^2; b. 06/2019 Echo: Mod AS. Mean grad 1mmHg, Valve area (VTI): 1.16cm^2.  . Moderate mitral regurgitation    a. 08/2017 Echo: mod MR; b. 06/2019 Echo: mild to mod MR.  . Rectal cancer [facility])    Past Surgical History:  Procedure Laterality Date  . APPENDECTOMY  06/2003  . CARDIAC CATHETERIZATION  03/2013   [facility]  . cataract surgery    . COLOSTOMY    . HERNIA REPAIR    . resection of rectum      Allergies  Allergies  Allergen Reactions  . Codeine Nausea Only  . Statins Other (See Comments)    REACTION: maylgias    History of Present Illness    79 year old male with the above complex past medical  history including HFrEF, ischemic cardiomyopathy, moderate aortic stenosis and moderate mitral regurgitation, hypertension, hyperlipidemia with statin intolerance, prior rectal cancer, remote tobacco abuse, and COPD.  In January 2015, he was diagnosed with a cardiomyopathy after echocardiogram showed an EF of 25% with moderate mitral regurgitation.  Diagnostic catheterization showed severe one-vessel CAD with a subtotal occlusion of the mid left circumflex, which appeared to be chronic.  He otherwise had moderate, nonobstructive disease.  There was concern for moderate to severe mitral regurgitation however his follow-up transesophageal echocardiogram showed only moderate mitral regurgitation.  This being the case, recommendation was made for medical therapy.  He has been undergoing echocardiograms every 2 years with improvement in LV function by echocardiogram in June 2019-45-50%.  I saw him in February of this year, at which time he was doing well with the exception of hypertension for which I switched his metoprolol to carvedilol.  In mid March, his wife noticed lower extremity swelling, prompting follow-up on March 17.  Swelling improves with elevating his legs.  A ReDS Vest reading was normal at 27% on March 17.  He did report some progression of what was previously stable dyspnea on exertion.  Echocardiogram was undertaken in April showing an EF of 30-35% with global hypokinesis, grade 1 diastolic dysfunction, and RVSP of 49.4 mmHg, and moderate aortic stenosis, and mild to  moderate mitral regurgitation.  Since his last visit, he notes that he has been stable.  He cont to have DOE, which he does not think has changed much.  He opted for a wheelchair transport from the medical mall today instead of walking.  He continues to have mild ankle edema and his wife notes that he has not been keeping his legs elevated.  He denies chest pain, palpitations, PND, orthopnea, dizziness, syncope, or early satiety.  Home  Medications    Prior to Admission medications   Medication Sig Start Date End Date Taking? Authorizing Provider  albuterol (VENTOLIN HFA) 108 (90 Base) MCG/ACT inhaler INHALE 2 PUFFS BY MOUTH EVERY 6 HOURS AS NEEDED FOR SHORTNESS OF BREATH 08/29/18   [name], NP  Alirocumab (PRALUENT) 75 MG/ML SOAJ Inject 1 pen into the skin every 14 (fourteen) days. 08/25/18   Wellington Hampshire, MD  aspirin 81 MG tablet Take 81 mg by mouth daily. Takes at night     [provider]  carvedilol (COREG) 12.5 MG tablet Take 1 tablet (12.5 mg total) by mouth 2 (two) times daily. 08/07/19 11/05/19  Wellington Hampshire, MD  ezetimibe (ZETIA) 10 MG tablet Take 1 tablet by mouth once daily for cholesterol. NEED APPOINTMENT FOR ANY MORE REFILLS 08/29/18   [name], NP  losartan (COZAAR) 100 MG tablet TAKE 1 TABLET BY MOUTH ONCE DAILY. 03/21/18   Wellington Hampshire, MD  Magnesium Oxide 400 (240 Mg) MG TABS TAKE 1 TABLET BY MOUTH ONCE DAILY. 05/03/17   Wellington Hampshire, MD  traZODone (DESYREL) 50 MG tablet Take 1 tablet (50 mg total) by mouth at bedtime as needed. for sleep 08/29/18   [name], NP    Review of Systems    Ongoing dyspnea on exertion and mild ankle edema.  He denies chest pain, palpitations, PND, orthopnea, dizziness, syncope, or early satiety.  All other systems reviewed and are otherwise negative except as noted above.  Physical Exam    VS:  BP (!) 142/80 (BP Location: Left Arm, Patient Position: Sitting, Cuff Size: Normal)   Pulse 82   Ht 5\' 6"  (1.676 m)   Wt 178 lb 8 oz (81 kg)   SpO2 93%   BMI 28.81 kg/m  , BMI Body mass index is 28.81 kg/m. GEN: Well nourished, well developed, in no acute distress. HEENT: normal. Neck: Supple, no JVD, carotid bruits, or masses. Cardiac: RRR, 2/6 systolic ejection murmur at the upper sternal borders, no rubs, or gallops. No clubbing, cyanosis.  Trace to 1+ bilateral ankle edema.  Radials/PT 2+ and equal bilaterally.  Respiratory:   Respirations regular and unlabored, bibasilar crackles. GI: Soft, nontender, nondistended, BS + x 4. MS: no deformity or atrophy. Skin: warm and dry, no rash. Neuro:  Strength and sensation are intact. Psych: Normal affect.  Accessory Clinical Findings    ECG personally reviewed by me today -sinus arrhythmia, 82, delayed R wave progression-question prior anterior infarct, mild, lateral ST depression- no acute changes.  Lab Results  Component Value Date   WBC 7.2 01/31/2019   HGB 12.4 (L) 01/31/2019   HCT 37.7 (L) 01/31/2019   MCV 93.1 01/31/2019   PLT 293.0 01/31/2019   Lab Results  Component Value Date   CREATININE 0.90 01/31/2019   BUN 15 01/31/2019   NA 134 (L) 01/31/2019   K 4.2 01/31/2019   CL 99 01/31/2019   CO2 28 01/31/2019   Lab Results  Component Value Date  ALT 11 01/31/2019   AST 14 01/31/2019   ALKPHOS 56 01/31/2019   BILITOT 1.0 01/31/2019   Lab Results  Component Value Date   CHOL 163 01/31/2019   HDL 56.70 01/31/2019   LDLCALC 85 01/31/2019   LDLDIRECT 129.7 04/14/2012   TRIG 105.0 01/31/2019   CHOLHDL 3 01/31/2019    Lab Results  Component Value Date   HGBA1C 5.7 01/31/2019    Assessment & Plan    1.  HFrEF/ischemic cardiomyopathy: EF previously as low as 25% in January 2015 with subsequent rise to 45-50% in June 2019 and slight drop back to 30-35% by most recent echo in April.  That was performed in the setting of progressive dyspnea on exertion and recent lower extremity swelling.  He continues to have dyspnea on exertion and also mild lower extremity edema.  Though previous ReDS Vest performed in the office in March was normal, on echocardiogram, his RVSP was moderately elevated at 49.4 mmHg.  Does have bibasilar crackles and mild lower extremity edema today.  I am going to add Lasix 20 mg daily.  We also discussed the need for an ischemic evaluation in the setting of recurrent drop in LV function, especially in light of dyspnea and edema.  It  is also notable that R wave progression is abnormal today with loss of R wave in V3.  He was agreeable to pursuing a [employer].  Continue carvedilol and losartan therapy.  With EF of less than 40%, we can consider switching losartan to [facility] and also adding MRA in the future.  2.  Coronary artery disease: Known chronic total occlusion of the left circumflex with moderate nonobstructive RCA disease and mild LAD disease on catheterization in 2015.  He has not been having any chest pain but has noted progressive dyspnea on exertion and as above, was recently noted to have a drop in his EF from 45-50% in June 2019 to 30-35% in April.  Arrange for [employer] as above.  Continue aspirin, beta-blocker, ARB, and Zetia.  He is intolerant to statins.  3.  Hypertension: Blood pressure elevated today at 142/80.  He does not know what this runs at home.  As I am adding low-dose furosemide, I will hold off on adjusting any other medications at this time.  We will consider switching losartan to Entresto if pressure stable or elevated at follow-up.  4.  Hyperlipidemia: Intolerant to statins.  Continue Zetia.  LDL was 85 in November 2020.  5.  Moderate aortic stenosis/moderate mitral regurgitation: Aortic stenosis relatively stable on recent echo with only a slight increase in mean gradient to 19 mmHg (up from 15 in 2019), and slight reduction in valve area to 1.16 cm (down from 1.25 cm in 2019).  Plan for follow-up echo in a year.  6.  Disposition: Follow-up basic metabolic panel in 1 week given new start of furosemide.  Follow-up Lexiscan Myoview to assess for ischemia.  Follow-up in clinic in approximately 2 weeks to review stress testing in consider switching losartan to Entresto.  Murray Hodgkins, NP 08/17/2019, 2:04 PM

## 2019-08-23 ENCOUNTER — Other Ambulatory Visit
Admission: RE | Admit: 2019-08-23 | Discharge: 2019-08-23 | Disposition: A | Discharge status: Home / Self Care | Payer: Medicare Other | Source: Ambulatory Visit | Attending: Nurse Practitioner | Admitting: Nurse Practitioner

## 2019-08-23 DIAGNOSIS — I5020 Unspecified systolic (congestive) heart failure: Secondary | ICD-10-CM | POA: Diagnosis not present

## 2019-08-23 LAB — BASIC METABOLIC PANEL
Anion gap: 9 (ref 5–15)
BUN: 14 mg/dL (ref 8–23)
CO2: 28 mmol/L (ref 22–32)
Calcium: 8.8 mg/dL — ABNORMAL LOW (ref 8.9–10.3)
Chloride: 96 mmol/L — ABNORMAL LOW (ref 98–111)
Creatinine, Ser: 0.77 mg/dL (ref 0.61–1.24)
GFR calc Af Amer: >60 mL/min (ref >60)
GFR calc non Af Amer: >60 mL/min (ref >60)
Glucose, Bld: 150 mg/dL — ABNORMAL HIGH (ref 70–99)
Potassium: 3.8 mmol/L (ref 3.5–5.1)
Sodium: 133 mmol/L — ABNORMAL LOW (ref 135–145)

## 2019-08-30 ENCOUNTER — Encounter
Admission: RE | Admit: 2019-08-30 | Discharge: 2019-08-30 | Disposition: A | Discharge status: Home / Self Care | Payer: Medicare Other | Source: Ambulatory Visit | Attending: Nurse Practitioner | Admitting: Nurse Practitioner

## 2019-08-30 ENCOUNTER — Other Ambulatory Visit: Payer: Self-pay

## 2019-08-30 ENCOUNTER — Telehealth: Payer: Self-pay | Admitting: Physician Assistant

## 2019-08-30 DIAGNOSIS — I5020 Unspecified systolic (congestive) heart failure: Secondary | ICD-10-CM | POA: Diagnosis not present

## 2019-08-30 MED ORDER — TECHNETIUM TC 99M TETROFOSMIN IV KIT
10.2320 | PACK | Freq: Once | INTRAVENOUS | Status: AC | PRN
  Administered 2019-08-30: 10.232 via INTRAVENOUS

## 2019-08-30 NOTE — Telephone Encounter (Signed)
Patient presented for Lexiscan MPI this morning with BP consistently 185-196/91-106. He was asymptomatic. He did not take his Coreg this morning, though does report taking his losartan. BP typically runs in the 063G to 949S systolic. He has already received his resting images this morning. He will go home and take his Coreg this morning and again this evening as directed. I have asked him to take Coreg and losartan tomorrow morning with a sip of water. No caffeine or tobacco. This will allow for him to keep his previously scheduled appointment tomorrow afternoon and not have to undergo repeat resting images.

## 2019-08-31 ENCOUNTER — Ambulatory Visit: Payer: Medicare Other | Admitting: Nurse Practitioner

## 2019-08-31 ENCOUNTER — Encounter: Payer: Self-pay | Admitting: Nurse Practitioner

## 2019-08-31 VITALS — BP 172/80 | HR 63 | Ht 66.0 in | Wt 176.2 lb

## 2019-08-31 DIAGNOSIS — I10 Essential (primary) hypertension: Secondary | ICD-10-CM

## 2019-08-31 DIAGNOSIS — I2510 Atherosclerotic heart disease of native coronary artery without angina pectoris: Secondary | ICD-10-CM

## 2019-08-31 DIAGNOSIS — E785 Hyperlipidemia, unspecified: Secondary | ICD-10-CM

## 2019-08-31 DIAGNOSIS — I255 Ischemic cardiomyopathy: Secondary | ICD-10-CM

## 2019-08-31 DIAGNOSIS — I5020 Unspecified systolic (congestive) heart failure: Secondary | ICD-10-CM

## 2019-08-31 DIAGNOSIS — I350 Nonrheumatic aortic (valve) stenosis: Secondary | ICD-10-CM

## 2019-08-31 DIAGNOSIS — I340 Nonrheumatic mitral (valve) insufficiency: Secondary | ICD-10-CM

## 2019-08-31 LAB — NM MYOCAR MULTI W/SPECT W/WALL MOTION / EF
LV dias vol: 161 mL (ref 62–150)
LV sys vol: 94 mL
Peak HR: 103 {beats}/min
Percent HR: 72 %
Rest HR: 69 {beats}/min
SDS: 1
SRS: 6
SSS: 7
TID: 1.07

## 2019-08-31 MED ORDER — REGADENOSON 0.4 MG/5ML IV SOLN
0.4000 mg | Freq: Once | INTRAVENOUS | Status: AC
  Administered 2019-08-31: 0.4 mg via INTRAVENOUS

## 2019-08-31 MED ORDER — TECHNETIUM TC 99M TETROFOSMIN IV KIT
31.9540 | PACK | Freq: Once | INTRAVENOUS | Status: AC | PRN
  Administered 2019-08-31: 31.954 via INTRAVENOUS

## 2019-08-31 MED ORDER — ENTRESTO 49-51 MG PO TABS: 1.0000 | ORAL_TABLET | Freq: Two times a day (BID) | ORAL | 3 refills | Status: DC

## 2019-08-31 MED ORDER — ENTRESTO 49-51 MG PO TABS: 1.0000 | ORAL_TABLET | Freq: Two times a day (BID) | ORAL | 6 refills | Status: DC

## 2019-08-31 NOTE — Patient Instructions (Signed)
Medication Instructions:  1- STOP Losartan  2- START Entresto Take 1 tablet by mouth 2 (two) times daily *If you need a refill on your cardiac medications before your next appointment, please call your pharmacy*   Lab Work: 1- Your physician recommends that you return for lab work in: 6 week(after starting [employer]) at [employer].  No appt is needed. Hours are M-F 7AM- 6 PM.  If you have labs (blood work) drawn today and your tests are completely normal, you will receive your results only by: Marland Kitchen MyChart Message (if you have MyChart) OR . A paper copy in the mail If you have any lab test that is abnormal or we need to change your treatment, we will call you to review the results.   Testing/Procedures: None ordered    Follow-Up: At [facility], you and your health needs are our priority.  As part of our continuing mission to provide you with exceptional heart care, we have created designated Provider Care Teams.  These Care Teams include your primary Cardiologist (physician) and Advanced Practice Providers (APPs -  Physician Assistants and Nurse Practitioners) who all work together to provide you with the care you need, when you need it.  We recommend signing up for the patient portal called "MyChart".  Sign up information is provided on this After Visit Summary.  MyChart is used to connect with patients for Virtual Visits (Telemedicine).  Patients are able to view lab/test results, encounter notes, upcoming appointments, etc.  Non-urgent messages can be sent to your provider as well.   To learn more about what you can do with MyChart, go to [email or web address].    Your next appointment:   1 month(s)  The format for your next appointment:   In Person  Provider:    You may see Kathlyn Sacramento, MD or Murray Hodgkins, NP

## 2019-08-31 NOTE — Progress Notes (Signed)
Office Visit    Patient Name: JAKSEN FIORELLA Date of Encounter: 08/31/2019  Primary Care Provider:  [name], NP Primary Cardiologist:  Kathlyn Sacramento, MD  Chief Complaint    79 y/o ? with a history of CAD, ischemic cardiomyopathy, HFrEF, moderate aortic stenosis, moderate mitral vegetation, hypertension, hyperlipidemia with statin intolerance, remote tobacco abuse, and COPD, who presents for follow-up after recent stress test.  Past Medical History    Past Medical History:  Diagnosis Date  . Agent orange exposure   . Arthritis 03/12/2011  . Chronic obstructive lung disease ([place])   . Coronary artery disease 03/2013   a. 03/2013 Cath: LM nl, LAD 27m, D1 50, D2 30, LCX 63m/d (chronic), OM1 50, RCA 40p, 13m, RPL 40, RPDA 60, EF 25%, mod-sev MR-->Med mgmt; b. 08/2019 MV: basal inflat/mid inflat defect consistent w/ ischemia in LCX distribution. EF 37%. Intermediate risk.  Marland Kitchen HFrEF (heart failure with reduced ejection fraction) ([place])    a. 03/2013 TEE: EF 25%; b. 08/2017 Echo: EF 45-50%, Gr1 DD. Mod AS. Mod MR. Mildly dil LA; c. 06/2019 Echo: EF 30-35%, glob HK, gr1 DD, RVSP 49.34mmHg. Mildly dil LA. Mod AS (VTI 1.16cm^2, mean grad 68mmHg).  . Hyperlipidemia   . Hypertension   . Ischemic cardiomyopathy    a. 03/2013 TEE: EF 25%; b. 08/2017 Echo: EF 45-50%; c. 06/2019 Echo: EF 30-35%.  . Moderate aortic stenosis    a. 08/2017 Echo: Mod AS. Mean grad (S) 41mmHg. Valve area (VTI): 1.25cm^2; b. 06/2019 Echo: Mod AS. Mean grad 80mmHg, Valve area (VTI): 1.16cm^2.  . Moderate mitral regurgitation    a. 08/2017 Echo: mod MR; b. 06/2019 Echo: mild to mod MR.  . Rectal cancer [facility])    Past Surgical History:  Procedure Laterality Date  . APPENDECTOMY  06/2003  . CARDIAC CATHETERIZATION  03/2013   [facility]  . cataract surgery    . COLOSTOMY    . HERNIA REPAIR    . resection of rectum      Allergies  Allergies  Allergen Reactions  . Codeine Nausea Only  . Statins Other (See Comments)     REACTION: maylgias    History of Present Illness    79 year old male with above complex past medical history including HFrEF, ischemic cardiomyopathy, moderate aortic stenosis and moderate mitral regurgitation, hypertension, hyperlipidemia with statin intolerance, prior rectal cancer, remote tobacco abuse, and COPD.  In January 2015, he was diagnosed with cardiomyopathy after echocardiogram showed an EF of 25% with moderate mitral regurgitation.  Diagnostic catheterization showed severe one-vessel coronary artery disease involving a subtotal occlusion of the left circumflex, which appeared to be chronic.  He otherwise had moderate, nonobstructive disease.  There was concern for moderate to severe mitral vegetation however, his follow-up TEE showed only moderate mitral regurgitation.  He has since been medically managed.  EF was 45 to 50% in June 2019.   I saw him in March of this year in the setting of lower extremity swelling.  Echocardiogram was performed in April and showed an EF of 30-35% with global hypokinesis, grade 1 diastolic dysfunction, moderate aortic stenosis,  mild to moderate MR, and RVSP of 49.12mmHg.  He reported ongoing dyspnea on exertion and also mild ankle swelling and I placed him on furosemide 20 mg daily.  I also obtained a Lexiscan Myoview which was performed earlier this morning and showed a medium defect of moderate severity in the basal inferolateral and mid inferolateral location consistent with ischemia in the  circumflex distribution.  EF was 37%.  Since his last visit, he notes that he has had improvement in dyspnea on exertion and resolution of lower extremity swelling since initiating furosemide therapy.  He has not experienced any chest pain.  He denies palpitations, PND, orthopnea, dizziness, syncope, edema, or early satiety.  Home Medications    Prior to Admission medications   Medication Sig Start Date End Date Taking? Authorizing Provider  albuterol (VENTOLIN HFA)  108 (90 Base) MCG/ACT inhaler INHALE 2 PUFFS BY MOUTH EVERY 6 HOURS AS NEEDED FOR SHORTNESS OF BREATH 08/29/18  Yes [name], NP  Alirocumab (PRALUENT) 75 MG/ML SOAJ Inject 1 pen into the skin every 14 (fourteen) days. 08/25/18  Yes Wellington Hampshire, MD  aspirin 81 MG tablet Take 81 mg by mouth daily. Takes at night    Yes [provider]  carvedilol (COREG) 12.5 MG tablet Take 1 tablet (12.5 mg total) by mouth 2 (two) times daily. 08/07/19 11/05/19 Yes Wellington Hampshire, MD  ezetimibe (ZETIA) 10 MG tablet Take 1 tablet by mouth once daily for cholesterol. NEED APPOINTMENT FOR ANY MORE REFILLS 08/29/18  Yes [name], NP  furosemide (LASIX) 20 MG tablet Take 1 tablet (20 mg total) by mouth daily. 08/17/19 11/15/19 Yes Theora Gianotti, NP  losartan (COZAAR) 100 MG tablet TAKE 1 TABLET BY MOUTH ONCE DAILY. 03/21/18  Yes Wellington Hampshire, MD  Magnesium Oxide 400 (240 Mg) MG TABS TAKE 1 TABLET BY MOUTH ONCE DAILY. 05/03/17  Yes Wellington Hampshire, MD  traZODone (DESYREL) 50 MG tablet Take 1 tablet (50 mg total) by mouth at bedtime as needed. for sleep 08/29/18  Yes [name], NP    Review of Systems    As noted above, improvement in dyspnea on exertion though not complete resolution.  No longer experiencing lower extremity edema.  He denies chest pain, palpitations, PND, orthopnea, dizziness, syncope, or early satiety.  All other systems reviewed and are otherwise negative except as noted above.  Physical Exam    VS:  BP (!) 172/80 (BP Location: Left Arm, Patient Position: Sitting, Cuff Size: Normal)   Pulse 63   Ht 5\' 6"  (1.676 m)   Wt 176 lb 4 oz (79.9 kg)   SpO2 93%   BMI 28.45 kg/m  , BMI Body mass index is 28.45 kg/m. GEN: Well nourished, well developed, in no acute distress. HEENT: normal. Neck: Supple, no JVD, carotid bruits, or masses. Cardiac: RRR, 2/6 systolic ejection murmur at the upper sternal borders, no rubs, or gallops. No clubbing, cyanosis,  edema.  Radials/PT 2+ and equal bilaterally.  Respiratory:  Respirations regular and unlabored, clear to auscultation bilaterally. GI: Soft, nontender, nondistended, BS + x 4. MS: no deformity or atrophy. Skin: warm and dry, no rash. Neuro:  Strength and sensation are intact. Psych: Normal affect.  Accessory Clinical Findings    ECG personally reviewed by me today -sinus rhythm/sinus arrhythmia, 62, PVC, lateral ST depression with biphasic T waves- no acute changes.  Lab Results  Component Value Date   WBC 7.2 01/31/2019   HGB 12.4 (L) 01/31/2019   HCT 37.7 (L) 01/31/2019   MCV 93.1 01/31/2019   PLT 293.0 01/31/2019   Lab Results  Component Value Date   CREATININE 0.77 08/23/2019   BUN 14 08/23/2019   NA 133 (L) 08/23/2019   K 3.8 08/23/2019   CL 96 (L) 08/23/2019   CO2 28 08/23/2019   Lab Results  Component Value Date  ALT 11 01/31/2019   AST 14 01/31/2019   ALKPHOS 56 01/31/2019   BILITOT 1.0 01/31/2019   Lab Results  Component Value Date   CHOL 163 01/31/2019   HDL 56.70 01/31/2019   LDLCALC 85 01/31/2019   LDLDIRECT 129.7 04/14/2012   TRIG 105.0 01/31/2019   CHOLHDL 3 01/31/2019    Lab Results  Component Value Date   HGBA1C 5.7 01/31/2019    Assessment & Plan    1.  HFrEF/ischemic cardiomyopathy: EF previously as low as 25% in January 2015 with subsequent rise to 45-50% in June 2019 and slight drop back to 30-35% by most recent echo in April 2021.  At his last visit, I placed him on low-dose Lasix therapy in the setting of increasing dyspnea on exertion and mild lower extremity edema along with RVSP of 49.4 mmHg on echo.  Since then, he has had improvement in exercise tolerance and resolution of lower extremity swelling.  Stress testing was performed this morning and showed basal inferolateral/mid inferolateral defect consistent with ischemia in the circumflex distribution.  The circumflex is known to have a chronic subtotal occlusion dating back to at least  2015.  We discussed options for management and will focus on medical therapy at the time being.  I will switch him from losartan 100 mg to Entresto 49/51 mg twice daily.  He receives his medications through the [facility] in [place] and so we have given her a written prescription to take there.  He otherwise remains on carvedilol therapy.  Plan to follow-up a basic metabolic panel in 1 week.  Would likely plan to titrate Entresto further and also add spironolactone at some point. We discussed the importance of daily weights, sodium restriction, medication compliance, and symptom reporting and he verbalizes understanding.   2.  Coronary artery disease: Known chronic subtotal occlusion of the left circumflex with moderate nonobstructive RCA disease and mild LAD disease on catheterization 2015.  In the setting of recent drop in EF, stress testing was undertaken this morning and as noted above, has shown basal inferolateral/mid inferolateral defect consistent with ischemia in the circumflex distribution.  Given known anatomy and absence of chest pain, will continue medical therapy with beta-blocker, aspirin, and PCSK9 inhibitor.   3.  Essential hypertension: Blood pressure is elevated today at 172/80.  Switching from losartan to [facility] as outlined above.  Continue beta-blocker therapy.  4.  Hyperlipidemia: Intolerant to statins.  Remains on Zetia and Praluent.  5.  Moderate aortic stenosis/moderate mitral regurgitation: Relatively stable on recent echo.  Follow-up echo in 1 year.  6.  Disposition: Follow-up basic metabolic panel in approximately 1 week.  Follow-up in clinic in 1 month or sooner if necessary.  Murray Hodgkins, NP 08/31/2019, 5:09 PM

## 2019-09-04 ENCOUNTER — Telehealth: Payer: Self-pay | Admitting: Cardiovascular Disease

## 2019-09-04 NOTE — Telephone Encounter (Signed)
Patient states if prescription is faxed to the [facility] they will take care of it for him - please call with any questions *STAT* If patient is at the pharmacy, call can be transferred to refill team.   1. Which medications need to be refilled? (please list name of each medication and dose if known) Entresto 49-51 MG  2. Which pharmacy/location (including street and city if local pharmacy) is medication to be sent to? [place] - fax [phone]  3. Do they need a 30 day or 90 day supply? 90 day

## 2019-09-05 ENCOUNTER — Telehealth: Payer: Self-pay | Admitting: Cardiovascular Disease

## 2019-09-05 NOTE — Telephone Encounter (Signed)
Spoke with the patient. Reviewed the patients medication. Patient is taking carvedilol and entresto as prescribed. Patient is tolerating his medications ok.  Explained to the patient the difference in both medications and why he is to take them twice daily. Patients question answered. No further action required.

## 2019-09-05 NOTE — Telephone Encounter (Signed)
Please call to discuss Carvedilol and Entresto. Patient feels like he is getting a "double dose"

## 2019-09-28 ENCOUNTER — Other Ambulatory Visit: Payer: Self-pay

## 2019-09-28 ENCOUNTER — Encounter: Payer: Self-pay | Admitting: Cardiovascular Disease

## 2019-09-28 ENCOUNTER — Ambulatory Visit: Payer: Medicare Other | Admitting: Cardiovascular Disease

## 2019-09-28 VITALS — BP 116/66 | HR 57 | Ht 66.0 in | Wt 179.0 lb

## 2019-09-28 DIAGNOSIS — E785 Hyperlipidemia, unspecified: Secondary | ICD-10-CM | POA: Diagnosis not present

## 2019-09-28 DIAGNOSIS — I2510 Atherosclerotic heart disease of native coronary artery without angina pectoris: Secondary | ICD-10-CM

## 2019-09-28 DIAGNOSIS — I255 Ischemic cardiomyopathy: Secondary | ICD-10-CM

## 2019-09-28 DIAGNOSIS — I5022 Chronic systolic (congestive) heart failure: Secondary | ICD-10-CM | POA: Diagnosis not present

## 2019-09-28 DIAGNOSIS — I10 Essential (primary) hypertension: Secondary | ICD-10-CM

## 2019-09-28 DIAGNOSIS — I350 Nonrheumatic aortic (valve) stenosis: Secondary | ICD-10-CM

## 2019-09-28 MED ORDER — FUROSEMIDE 40 MG PO TABS
40.0000 mg | ORAL_TABLET | Freq: Every day | ORAL | 1 refills | Status: DC
Start: 2019-09-28 — End: 2020-01-25

## 2019-09-28 NOTE — Patient Instructions (Signed)
Medication Instructions:  Your physician has recommended you make the following change in your medication:   INCREASE FUROSEMIDE 40 MG TABLET BY MOUTH DAILY.  *If you need a refill on your cardiac medications before your next appointment, please call your pharmacy*   Lab Work:  BMET TODAY  If you have labs (blood work) drawn today and your tests are completely normal, you will receive your results only by: Marland Kitchen MyChart Message (if you have MyChart) OR . A paper copy in the mail If you have any lab test that is abnormal or we need to change your treatment, we will call you to review the results.   Testing/Procedures: NONE   Follow-Up: At [facility], you and your health needs are our priority.  As part of our continuing mission to provide you with exceptional heart care, we have created designated Provider Care Teams.  These Care Teams include your primary Cardiologist (physician) and Advanced Practice Providers (APPs -  Physician Assistants and Nurse Practitioners) who all work together to provide you with the care you need, when you need it.  We recommend signing up for the patient portal called "MyChart".  Sign up information is provided on this After Visit Summary.  MyChart is used to connect with patients for Virtual Visits (Telemedicine).  Patients are able to view lab/test results, encounter notes, upcoming appointments, etc.  Non-urgent messages can be sent to your provider as well.   To learn more about what you can do with MyChart, go to [email or web address].    Your next appointment:   3 month(s)  The format for your next appointment:   In Person  Provider:    You may see Kathlyn Sacramento, MD or one of the following Advanced Practice Providers on your designated Care Team:    Murray Hodgkins, NP  Christell Faith, PA-C  Marrianne Mood, PA-C

## 2019-09-28 NOTE — Progress Notes (Signed)
Cardiology Office Note   Date:  09/28/2019   ID:  HELIX LAFONTAINE, DOB Nov 27, 1940, MRN [id number]  PCP:  [name], NP  Cardiologist:   Kathlyn Sacramento, MD   Chief Complaint  Patient presents with  . Other    1 month follow up post ECHO. Patient c.o some SOB. Meds reviewed verbally with patient.       History of Present Illness: Curtis Campos is a 79 y.o. male who presents for a follow-up visit regarding chronic systolic heart failure, coronary artery disease, aortic stenosis and mitral regurgitation.  He has known history of hyperlipidemia with intolerance to statins, hypertension, previous rectal cancer, previous tobacco use and COPD.  Echocardiogram in 2015 showed an EF of 25% with moderate mitral regurgitation A right and left cardiac catheterization showed severe one-vessel coronary artery disease with a subtotal occlusion of the mid left circumflex which appeared to be chronic.  Most recent echocardiogram in June 2019 showed an EF of 45 to 50%, moderate aortic stenosis with a valve area of 1.25 cm and moderate mitral regurgitation.  Spironolactone was discontinued due to hyperkalemia.    He was seen in March for increased lower extremity edema.  He had an echocardiogram done in April which showed an EF of 30 to 35%, moderate aortic stenosis, mild to moderate mitral regurgitation and moderate pulmonary hypertension.  Lexiscan Myoview showed evidence of ischemia in the left circumflex distribution.  He improved with furosemide.  He was switched from losartan to [facility] during last visit.  He has been doing well overall with no chest pain.  He reports stable exertional dyspnea.  He continues to have leg edema although it improved from before.     Past Medical History:  Diagnosis Date  . Agent orange exposure   . Arthritis 03/12/2011  . Chronic obstructive lung disease ([place])   . Coronary artery disease 03/2013   a. 03/2013 Cath: LM nl, LAD 8m, D1 50, D2 30,  LCX 25m/d (chronic), OM1 50, RCA 40p, 11m, RPL 40, RPDA 60, EF 25%, mod-sev MR-->Med mgmt; b. 08/2019 MV: basal inflat/mid inflat defect consistent w/ ischemia in LCX distribution. EF 37%. Intermediate risk.  Marland Kitchen HFrEF (heart failure with reduced ejection fraction) ([employer])    a. 03/2013 TEE: EF 25%; b. 08/2017 Echo: EF 45-50%, Gr1 DD. Mod AS. Mod MR. Mildly dil LA; c. 06/2019 Echo: EF 30-35%, glob HK, gr1 DD, RVSP 49.2mmHg. Mildly dil LA. Mod AS (VTI 1.16cm^2, mean grad 64mmHg).  . Hyperlipidemia   . Hypertension   . Ischemic cardiomyopathy    a. 03/2013 TEE: EF 25%; b. 08/2017 Echo: EF 45-50%; c. 06/2019 Echo: EF 30-35%.  . Moderate aortic stenosis    a. 08/2017 Echo: Mod AS. Mean grad (S) 89mmHg. Valve area (VTI): 1.25cm^2; b. 06/2019 Echo: Mod AS. Mean grad 71mmHg, Valve area (VTI): 1.16cm^2.  . Moderate mitral regurgitation    a. 08/2017 Echo: mod MR; b. 06/2019 Echo: mild to mod MR.  . Rectal cancer [facility])     Past Surgical History:  Procedure Laterality Date  . APPENDECTOMY  06/2003  . CARDIAC CATHETERIZATION  03/2013   [facility]  . cataract surgery    . COLOSTOMY    . HERNIA REPAIR    . resection of rectum       Current Outpatient Medications  Medication Sig Dispense Refill  . albuterol (VENTOLIN HFA) 108 (90 Base) MCG/ACT inhaler INHALE 2 PUFFS BY MOUTH EVERY 6 HOURS AS NEEDED FOR SHORTNESS OF  BREATH 18 g 0  . Alirocumab (PRALUENT) 75 MG/ML SOAJ Inject 1 pen into the skin every 14 (fourteen) days. 2 pen 11  . aspirin 81 MG tablet Take 81 mg by mouth daily. Takes at night     . carvedilol (COREG) 12.5 MG tablet Take 1 tablet (12.5 mg total) by mouth 2 (two) times daily. 180 tablet 3  . ezetimibe (ZETIA) 10 MG tablet Take 1 tablet by mouth once daily for cholesterol. NEED APPOINTMENT FOR ANY MORE REFILLS 90 tablet 1  . furosemide (LASIX) 20 MG tablet Take 1 tablet (20 mg total) by mouth daily. 90 tablet 3  . Magnesium Oxide 400 (240 Mg) MG TABS TAKE 1 TABLET BY MOUTH ONCE DAILY. 90 tablet 3  .  sacubitril-valsartan (ENTRESTO) 49-51 MG Take 1 tablet by mouth 2 (two) times daily. 60 tablet 6  . traZODone (DESYREL) 50 MG tablet Take 1 tablet (50 mg total) by mouth at bedtime as needed. for sleep 90 tablet 0   No current facility-administered medications for this visit.    Allergies:   Codeine and Statins    Social History:  The patient  reports that he quit smoking about 6 years ago. His smoking use included cigarettes. He has a 50.00 pack-year smoking history. He has never used smokeless tobacco. He reports that he does not drink alcohol and does not use drugs.   Family History:  The patient's family history includes Cancer - Cervical in his sister; Heart attack (age of onset: 12) in his brother; Heart disease in his father, mother, sister, and sister; Hyperlipidemia in his father; Hypertension in his father.    ROS:  Please see the history of present illness.   Otherwise, review of systems are positive for none.   All other systems are reviewed and negative.    PHYSICAL EXAM: VS:  BP 116/66 (BP Location: Right Arm, Patient Position: Sitting, Cuff Size: Normal)   Pulse (!) 57   Ht 5\' 6"  (1.676 m)   Wt 179 lb (81.2 kg)   SpO2 94%   BMI 28.89 kg/m  , BMI Body mass index is 28.89 kg/m. GEN: Well nourished, well developed, in no acute distress  HEENT: normal  Neck: no JVD, carotid bruits, or masses Cardiac: RRR with premature beats; rubs, or gallops,no edema . 2/6 mid peaking crescendo decrescendo murmur in the aortic area. 2/6 holosystolic murmur at the apex.  There is mild bilateral leg edema Respiratory:  clear to auscultation bilaterally, normal work of breathing GI: soft, nontender, nondistended, + BS MS: no deformity or atrophy  Skin: warm and dry, no rash Neuro:  Strength and sensation are intact Psych: euthymic mood, full affect   EKG:  EKG is not ordered today.    Recent Labs: 01/31/2019: ALT 11; Hemoglobin 12.4; Platelets 293.0 08/23/2019: BUN 14; Creatinine,  Ser 0.77; Potassium 3.8; Sodium 133    Lipid Panel    Component Value Date/Time   CHOL 163 01/31/2019 1106   TRIG 105.0 01/31/2019 1106   HDL 56.70 01/31/2019 1106   CHOLHDL 3 01/31/2019 1106   VLDL 21.0 01/31/2019 1106   LDLCALC 85 01/31/2019 1106   LDLDIRECT 129.7 04/14/2012 0756      Wt Readings from Last 3 Encounters:  09/28/19 179 lb (81.2 kg)  08/31/19 176 lb 4 oz (79.9 kg)  08/17/19 178 lb 8 oz (81 kg)         ASSESSMENT AND PLAN:  1. Chronic systolic heart failure: Most recent ejection fraction was 30  to 35% in April.  Improved with furosemide 20 mg once daily but he continues to be mildly volume overloaded.  I increased furosemide to 40 mg once daily.   Continue treatment with carvedilol and Entresto.  He did not tolerate spironolactone in the past due to hyperkalemia but if his labs are stable, we might try a small dose in the near future.  Check basic metabolic profile today.  2. Coronary artery disease: Currently with no anginal symptoms.  Recent nuclear stress test showed evidence of ischemia in the left circumflex distribution which is expected given known chronic subtotal occlusion of the mid left circumflex.  3. Essential hypertension: Blood pressure improved since switching losartan to Entresto.  4. Hyperlipidemia: He is intolerant to statins .  He is currently on Praluent and Zetia.  Most recent lipid profile showed an LDL of 85.  5. Moderate aortic stenosis and moderate mitral regurgitation.    Recommend a repeat echocardiogram in April 2022.    Disposition:   FU with me in 3 months  Signed,  Kathlyn Sacramento, MD  09/28/2019 2:27 PM    [place]

## 2019-09-29 LAB — BASIC METABOLIC PANEL
BUN/Creatinine Ratio: 18 (ref 10–24)
BUN: 16 mg/dL (ref 8–27)
CO2: 25 mmol/L (ref 20–29)
Calcium: 9.1 mg/dL (ref 8.6–10.2)
Chloride: 100 mmol/L (ref 96–106)
Creatinine, Ser: 0.89 mg/dL (ref 0.76–1.27)
GFR calc Af Amer: 95 mL/min/{1.73_m2} (ref >59)
GFR calc non Af Amer: 82 mL/min/{1.73_m2} (ref >59)
Glucose: 75 mg/dL (ref 65–99)
Potassium: 4.3 mmol/L (ref 3.5–5.2)
Sodium: 140 mmol/L (ref 134–144)

## 2019-10-30 ENCOUNTER — Other Ambulatory Visit: Payer: Self-pay

## 2019-10-30 MED ORDER — ENTRESTO 49-51 MG PO TABS: 1.0000 | ORAL_TABLET | Freq: Two times a day (BID) | ORAL | 1 refills | Status: DC

## 2019-10-30 NOTE — Telephone Encounter (Signed)
*  STAT* If patient is at the pharmacy, call can be transferred to refill team.   1. Which medications need to be refilled? (please list name of each medication and dose if known) Entresto  2. Which pharmacy/location (including street and city if local pharmacy) is medication to be sent [facility] [place] Phone [phone]  3. Do they need a 30 day or 90 day supply? [place]

## 2019-12-25 ENCOUNTER — Other Ambulatory Visit: Payer: Self-pay | Admitting: Cardiovascular Disease

## 2019-12-25 MED ORDER — ENTRESTO 49-51 MG PO TABS: 1.0000 | ORAL_TABLET | Freq: Two times a day (BID) | ORAL | 3 refills | Status: DC

## 2019-12-25 NOTE — Telephone Encounter (Signed)
Left a message on the patient's voice mail to contact our office back for more information regarding the [place].

## 2019-12-25 NOTE — Telephone Encounter (Signed)
*  STAT* If patient is at the pharmacy, call can be transferred to refill team.   1. Which medications need to be refilled? (please list name of each medication and dose if known)   Entresto 49-51 mg po BID   2. Which pharmacy/location (including street and city if local pharmacy) is medication to be sent to?  [place]   3. Do they need a 30 day or 90 day supply? 90   Fax  [phone]

## 2019-12-26 MED ORDER — ENTRESTO 49-51 MG PO TABS: 1.0000 | ORAL_TABLET | Freq: Two times a day (BID) | ORAL | 0 refills | Status: DC

## 2019-12-26 NOTE — Telephone Encounter (Signed)
Patient returning call.

## 2019-12-26 NOTE — Telephone Encounter (Signed)
Requested Prescriptions   Signed Prescriptions Disp Refills   sacubitril-valsartan (ENTRESTO) 49-51 MG 180 tablet 0    Sig: Take 1 tablet by mouth 2 (two) times daily.    Authorizing Provider: Kathlyn Sacramento A    Ordering User: Raelene Bott, Ezechiel Stooksbury L

## 2019-12-29 ENCOUNTER — Encounter: Payer: Self-pay | Admitting: *Deleted

## 2019-12-29 ENCOUNTER — Telehealth: Payer: Self-pay

## 2019-12-29 NOTE — Progress Notes (Signed)
Received fax from [place] requesting RX to authorize ostomy supplies.  Pt discharged from MD services in 2019.  RN placed call to pts pcp office, Dr. Alma Friendly to request MD to resume prescribing ostomy supplies.  RN sucessfully faxed RX authorization with attention to [facility] with Dr. Carlis Abbott [phone]).

## 2019-12-29 NOTE — Telephone Encounter (Signed)
Curtis Campos with [facility] said that pt was discharged from [facility] in 2019 but the doctor continued to prescribe the ostomy supplies. Curtis Campos said that pt has changed suppliers and now [employer] has requested ostomy supplies; Ostomy skin barrier with flange and irrigation supply sets and irrigation sleeve. [employer] is also asking for documentation by office notes. Curtis Campos is going to fax the request from [employer] to [phone] with attention to [facility] (Fax near [employer] [facility].) Last annual exam on 02/07/2019. No future appts scheduled at this time.

## 2019-12-29 NOTE — Telephone Encounter (Signed)
Placed order in your box for review.

## 2020-01-01 NOTE — Telephone Encounter (Signed)
Completed and placed in Joellen's inbox on 12/31/19

## 2020-01-02 ENCOUNTER — Encounter: Payer: Self-pay | Admitting: Cardiovascular Disease

## 2020-01-02 ENCOUNTER — Ambulatory Visit (INDEPENDENT_AMBULATORY_CARE_PROVIDER_SITE_OTHER): Payer: Medicare Other | Admitting: Cardiovascular Disease

## 2020-01-02 ENCOUNTER — Other Ambulatory Visit: Payer: Self-pay

## 2020-01-02 VITALS — BP 130/60 | HR 66 | Ht 66.0 in | Wt 177.8 lb

## 2020-01-02 DIAGNOSIS — I5022 Chronic systolic (congestive) heart failure: Secondary | ICD-10-CM

## 2020-01-02 DIAGNOSIS — I2510 Atherosclerotic heart disease of native coronary artery without angina pectoris: Secondary | ICD-10-CM | POA: Diagnosis not present

## 2020-01-02 DIAGNOSIS — I10 Essential (primary) hypertension: Secondary | ICD-10-CM | POA: Diagnosis not present

## 2020-01-02 DIAGNOSIS — E785 Hyperlipidemia, unspecified: Secondary | ICD-10-CM

## 2020-01-02 DIAGNOSIS — Z23 Encounter for immunization: Secondary | ICD-10-CM

## 2020-01-02 MED ORDER — SPIRONOLACTONE 25 MG PO TABS
12.5000 mg | ORAL_TABLET | Freq: Every day | ORAL | 5 refills | Status: DC
Start: 2020-01-02 — End: 2020-01-03

## 2020-01-02 NOTE — Progress Notes (Signed)
Cardiology Office Note   Date:  01/02/2020   ID:  Curtis Campos, Curtis Campos 03/23/1941, MRN [id number]  PCP:  Curtis Koch, NP  Cardiologist:   Curtis Sacramento, MD   Chief Complaint  Patient presents with  . Other    3 month f/u no complaints today. Meds reviewed verbally with pt.       History of Present Illness: Curtis Campos is a 79 y.o. male who presents for a follow-up visit regarding chronic systolic heart failure, coronary artery disease, aortic stenosis and mitral regurgitation.  He has known history of hyperlipidemia with intolerance to statins, hypertension, previous rectal cancer, previous tobacco use and COPD.  Echocardiogram in 2015 showed an EF of 25% with moderate mitral regurgitation A right and left cardiac catheterization showed severe one-vessel coronary artery disease with a subtotal occlusion of the mid left circumflex which appeared to be chronic.  Most recent echocardiogram in June 2019 showed an EF of 45 to 50%, moderate aortic stenosis with a valve area of 1.25 cm and moderate mitral regurgitation.  Spironolactone was discontinued due to hyperkalemia.    He was seen in March for increased lower extremity edema.  He had an echocardiogram done in April which showed an EF of 30 to 35%, moderate aortic stenosis, mild to moderate mitral regurgitation and moderate pulmonary hypertension.  Lexiscan Myoview showed evidence of ischemia in the left circumflex distribution.  He improved with furosemide.  He was switched from losartan to [facility] during last visit.  Furosemide was increased to 40 mg once daily during last visit.  He has been doing well with no chest pain.  He reports stable exertional dyspnea.  No significant leg edema.    Past Medical History:  Diagnosis Date  . Agent orange exposure   . Arthritis 03/12/2011  . Chronic obstructive lung disease ([place])   . Coronary artery disease 03/2013   a. 03/2013 Cath: LM nl, LAD 7m, D1 50, D2 30, LCX  70m/d (chronic), OM1 50, RCA 40p, 46m, RPL 40, RPDA 60, EF 25%, mod-sev MR-->Med mgmt; b. 08/2019 MV: basal inflat/mid inflat defect consistent w/ ischemia in LCX distribution. EF 37%. Intermediate risk.  Marland Kitchen HFrEF (heart failure with reduced ejection fraction) ([place])    a. 03/2013 TEE: EF 25%; b. 08/2017 Echo: EF 45-50%, Gr1 DD. Mod AS. Mod MR. Mildly dil LA; c. 06/2019 Echo: EF 30-35%, glob HK, gr1 DD, RVSP 49.82mmHg. Mildly dil LA. Mod AS (VTI 1.16cm^2, mean grad 72mmHg).  . Hyperlipidemia   . Hypertension   . Ischemic cardiomyopathy    a. 03/2013 TEE: EF 25%; b. 08/2017 Echo: EF 45-50%; c. 06/2019 Echo: EF 30-35%.  . Moderate aortic stenosis    a. 08/2017 Echo: Mod AS. Mean grad (S) 65mmHg. Valve area (VTI): 1.25cm^2; b. 06/2019 Echo: Mod AS. Mean grad 45mmHg, Valve area (VTI): 1.16cm^2.  . Moderate mitral regurgitation    a. 08/2017 Echo: mod MR; b. 06/2019 Echo: mild to mod MR.  . Rectal cancer [facility])     Past Surgical History:  Procedure Laterality Date  . APPENDECTOMY  06/2003  . CARDIAC CATHETERIZATION  03/2013   [facility]  . cataract surgery    . COLOSTOMY    . HERNIA REPAIR    . resection of rectum       Current Outpatient Medications  Medication Sig Dispense Refill  . albuterol (VENTOLIN HFA) 108 (90 Base) MCG/ACT inhaler INHALE 2 PUFFS BY MOUTH EVERY 6 HOURS AS NEEDED FOR SHORTNESS OF BREATH  18 g 0  . Alirocumab (PRALUENT) 75 MG/ML SOAJ Inject 1 pen into the skin every 14 (fourteen) days. 2 pen 11  . aspirin 81 MG tablet Take 81 mg by mouth daily. Takes at night     . carvedilol (COREG) 12.5 MG tablet Take 1 tablet (12.5 mg total) by mouth 2 (two) times daily. 180 tablet 3  . cholecalciferol (VITAMIN D3) 25 MCG (1000 UNIT) tablet Take 1,000 Units by mouth daily.    Marland Kitchen ezetimibe (ZETIA) 10 MG tablet Take 1 tablet by mouth once daily for cholesterol. NEED APPOINTMENT FOR ANY MORE REFILLS 90 tablet 1  . furosemide (LASIX) 40 MG tablet Take 1 tablet (40 mg total) by mouth daily. 90 tablet 1  .  Magnesium Oxide 400 (240 Mg) MG TABS TAKE 1 TABLET BY MOUTH ONCE DAILY. 90 tablet 3  . sacubitril-valsartan (ENTRESTO) 49-51 MG Take 1 tablet by mouth 2 (two) times daily. 180 tablet 0  . traZODone (DESYREL) 50 MG tablet Take 1 tablet (50 mg total) by mouth at bedtime as needed. for sleep 90 tablet 0   No current facility-administered medications for this visit.    Allergies:   Codeine and Statins    Social History:  The patient  reports that he quit smoking about 6 years ago. His smoking use included cigarettes. He has a 50.00 pack-year smoking history. He has never used smokeless tobacco. He reports that he does not drink alcohol and does not use drugs.   Family History:  The patient's family history includes Cancer - Cervical in his sister; Heart attack (age of onset: 34) in his brother; Heart disease in his father, mother, sister, and sister; Hyperlipidemia in his father; Hypertension in his father.    ROS:  Please see the history of present illness.   Otherwise, review of systems are positive for none.   All other systems are reviewed and negative.    PHYSICAL EXAM: VS:  BP 130/60 (BP Location: Left Arm, Patient Position: Sitting, Cuff Size: Normal)   Pulse 66   Ht 5\' 6"  (1.676 m)   Wt 177 lb 12 oz (80.6 kg)   SpO2 98%   BMI 28.69 kg/m  , BMI Body mass index is 28.69 kg/m. GEN: Well nourished, well developed, in no acute distress  HEENT: normal  Neck: no JVD, carotid bruits, or masses Cardiac: RRR with premature beats; rubs, or gallops,no edema . 2/6 mid peaking crescendo decrescendo murmur in the aortic area. 2/6 holosystolic murmur at the apex.  There is trace bilateral leg edema Respiratory:  clear to auscultation bilaterally, normal work of breathing GI: soft, nontender, nondistended, + BS MS: no deformity or atrophy  Skin: warm and dry, no rash Neuro:  Strength and sensation are intact Psych: euthymic mood, full affect   EKG:  EKG is  ordered today. EKG showed normal  sinus rhythm with PACs and nonspecific T wave changes.   Recent Labs: 01/31/2019: ALT 11; Hemoglobin 12.4; Platelets 293.0 09/28/2019: BUN 16; Creatinine, Ser 0.89; Potassium 4.3; Sodium 140    Lipid Panel    Component Value Date/Time   CHOL 163 01/31/2019 1106   TRIG 105.0 01/31/2019 1106   HDL 56.70 01/31/2019 1106   CHOLHDL 3 01/31/2019 1106   VLDL 21.0 01/31/2019 1106   LDLCALC 85 01/31/2019 1106   LDLDIRECT 129.7 04/14/2012 0756      Wt Readings from Last 3 Encounters:  01/02/20 177 lb 12 oz (80.6 kg)  09/28/19 179 lb (81.2 kg)  08/31/19  176 lb 4 oz (79.9 kg)         ASSESSMENT AND PLAN:  1. Chronic systolic heart failure: Most recent ejection fraction was 30 to 35% in April.  He appears to be euvolemic after increasing furosemide to 40 mg once daily. Continue treatment with carvedilol and Entresto.  I am going to challenge him with small dose of Aldactone again to see if he can tolerate.  Check basic metabolic profile in 1 week.  Next step would be to add [place].  Cost might be an issue.  2. Coronary artery disease: Currently with no anginal symptoms.  Nuclear stress test in June showed evidence of ischemia in the left circumflex distribution which is expected given known chronic subtotal occlusion of the mid left circumflex.  3. Essential hypertension: Blood pressure improved since switching losartan to Entresto.  4. Hyperlipidemia: He is intolerant to statins .  He is currently on Praluent and Zetia.  Most recent lipid profile showed an LDL of 85.  5. Moderate aortic stenosis and moderate mitral regurgitation.    Recommend a repeat echocardiogram in April 2022.    Disposition:   FU with me in 3 months  Signed,  Curtis Sacramento, MD  01/02/2020 4:12 PM    [place]

## 2020-01-02 NOTE — Patient Instructions (Signed)
Medication Instructions:  Your physician has recommended you make the following change in your medication:   START Spironolactone 12.5 mg (1/2 tablet) daily. An Rx has been sent to your pharmacy.  You have been give the flu vaccine today.  *If you need a refill on your cardiac medications before your next appointment, please call your pharmacy*   Lab Work: Your physician recommends that you return for lab work (bmet) in: 1 week  Please have your lab drawn at the [facility]. You do not need an appointment. Lab hours are Mon-Fri 7:30a-6pm If you have labs (blood work) drawn today and your tests are completely normal, you will receive your results only by: Marland Kitchen MyChart Message (if you have MyChart) OR . A paper copy in the mail If you have any lab test that is abnormal or we need to change your treatment, we will call you to review the results.   Testing/Procedures: None ordered   Follow-Up: At [facility], you and your health needs are our priority.  As part of our continuing mission to provide you with exceptional heart care, we have created designated Provider Care Teams.  These Care Teams include your primary Cardiologist (physician) and Advanced Practice Providers (APPs -  Physician Assistants and Nurse Practitioners) who all work together to provide you with the care you need, when you need it.  We recommend signing up for the patient portal called "MyChart".  Sign up information is provided on this After Visit Summary.  MyChart is used to connect with patients for Virtual Visits (Telemedicine).  Patients are able to view lab/test results, encounter notes, upcoming appointments, etc.  Non-urgent messages can be sent to your provider as well.   To learn more about what you can do with MyChart, go to [email or web address].    Your next appointment:   3 month(s)  The format for your next appointment:   In Person  Provider:   You may see Kathlyn Sacramento, MD or one of the  following Advanced Practice Providers on your designated Care Team:    Murray Hodgkins, NP  Christell Faith, PA-C  Marrianne Mood, PA-C  Cadence Kathlen Mody, [place]    Other Instructions N/A

## 2020-01-02 NOTE — Telephone Encounter (Signed)
Order re faxed to number provider. Called and left message for [name] to let her know. I have called patient and made follow up for November. Per patient request appointment reminder mailed.

## 2020-01-02 NOTE — Telephone Encounter (Signed)
Curtis Campos with [name] is wanting to know how soon the order will be faxed back to them for ostomy supplies. She would like a call back or the order faxed as soon as possible.

## 2020-01-03 ENCOUNTER — Other Ambulatory Visit: Payer: Self-pay | Admitting: Cardiovascular Disease

## 2020-01-03 ENCOUNTER — Other Ambulatory Visit: Payer: Self-pay

## 2020-01-03 MED ORDER — ENTRESTO 49-51 MG PO TABS: 1.0000 | ORAL_TABLET | Freq: Two times a day (BID) | ORAL | 0 refills | Status: DC

## 2020-01-03 MED ORDER — SPIRONOLACTONE 25 MG PO TABS: 12.5000 mg | ORAL_TABLET | Freq: Every day | ORAL | 5 refills | Status: DC

## 2020-01-03 NOTE — Telephone Encounter (Signed)
Rx was sent [place] per patient request.

## 2020-01-03 NOTE — Telephone Encounter (Signed)
[facility] [place]

## 2020-01-03 NOTE — Addendum Note (Signed)
Addended by: Ronaldo Miyamoto on: 01/03/2020 04:54 PM   Modules accepted: Orders

## 2020-01-04 DIAGNOSIS — Z433 Encounter for attention to colostomy: Secondary | ICD-10-CM | POA: Diagnosis not present

## 2020-01-04 DIAGNOSIS — Z933 Colostomy status: Secondary | ICD-10-CM | POA: Diagnosis not present

## 2020-01-05 DIAGNOSIS — Z433 Encounter for attention to colostomy: Secondary | ICD-10-CM | POA: Diagnosis not present

## 2020-01-05 DIAGNOSIS — Z933 Colostomy status: Secondary | ICD-10-CM | POA: Diagnosis not present

## 2020-01-11 ENCOUNTER — Other Ambulatory Visit
Admission: RE | Admit: 2020-01-11 | Discharge: 2020-01-11 | Disposition: A | Discharge status: Home / Self Care | Payer: Medicare Other | Attending: Cardiovascular Disease | Admitting: Cardiovascular Disease

## 2020-01-11 ENCOUNTER — Other Ambulatory Visit: Payer: Self-pay

## 2020-01-11 DIAGNOSIS — I5022 Chronic systolic (congestive) heart failure: Secondary | ICD-10-CM

## 2020-01-11 LAB — BASIC METABOLIC PANEL
Anion gap: 8 (ref 5–15)
BUN: 14 mg/dL (ref 8–23)
CO2: 30 mmol/L (ref 22–32)
Calcium: 9.3 mg/dL (ref 8.9–10.3)
Chloride: 95 mmol/L — ABNORMAL LOW (ref 98–111)
Creatinine, Ser: 0.84 mg/dL (ref 0.61–1.24)
GFR, Estimated: >60 mL/min (ref >60)
Glucose, Bld: 156 mg/dL — ABNORMAL HIGH (ref 70–99)
Potassium: 3.8 mmol/L (ref 3.5–5.1)
Sodium: 133 mmol/L — ABNORMAL LOW (ref 135–145)

## 2020-01-25 ENCOUNTER — Other Ambulatory Visit: Payer: Self-pay | Admitting: Cardiovascular Disease

## 2020-01-25 NOTE — Telephone Encounter (Signed)
Rx request sent to pharmacy.  

## 2020-02-12 ENCOUNTER — Ambulatory Visit (INDEPENDENT_AMBULATORY_CARE_PROVIDER_SITE_OTHER): Payer: Medicare Other | Admitting: Primary Care

## 2020-02-12 ENCOUNTER — Other Ambulatory Visit: Payer: Self-pay

## 2020-02-12 ENCOUNTER — Encounter: Payer: Self-pay | Admitting: Primary Care

## 2020-02-12 VITALS — BP 134/78 | HR 67 | Temp 98.1°F | Resp 15 | Ht 66.0 in | Wt 173.0 lb

## 2020-02-12 DIAGNOSIS — M1990 Unspecified osteoarthritis, unspecified site: Secondary | ICD-10-CM | POA: Diagnosis not present

## 2020-02-12 DIAGNOSIS — I5022 Chronic systolic (congestive) heart failure: Secondary | ICD-10-CM

## 2020-02-12 DIAGNOSIS — G4700 Insomnia, unspecified: Secondary | ICD-10-CM

## 2020-02-12 DIAGNOSIS — Z0000 Encounter for general adult medical examination without abnormal findings: Secondary | ICD-10-CM | POA: Diagnosis not present

## 2020-02-12 DIAGNOSIS — I429 Cardiomyopathy, unspecified: Secondary | ICD-10-CM

## 2020-02-12 DIAGNOSIS — I10 Essential (primary) hypertension: Secondary | ICD-10-CM

## 2020-02-12 DIAGNOSIS — R7303 Prediabetes: Secondary | ICD-10-CM

## 2020-02-12 DIAGNOSIS — E871 Hypo-osmolality and hyponatremia: Secondary | ICD-10-CM

## 2020-02-12 DIAGNOSIS — E785 Hyperlipidemia, unspecified: Secondary | ICD-10-CM | POA: Diagnosis not present

## 2020-02-12 DIAGNOSIS — J449 Chronic obstructive pulmonary disease, unspecified: Secondary | ICD-10-CM

## 2020-02-12 DIAGNOSIS — I2510 Atherosclerotic heart disease of native coronary artery without angina pectoris: Secondary | ICD-10-CM

## 2020-02-12 DIAGNOSIS — I509 Heart failure, unspecified: Secondary | ICD-10-CM

## 2020-02-12 NOTE — Assessment & Plan Note (Signed)
Doing well on Trazodone 50 mg for which he takes nightly. [facility]. Continue same.

## 2020-02-12 NOTE — Assessment & Plan Note (Signed)
Evident on recent labs, appears overall stable. Managed on furosemide per cardiology. Continue to monitor.

## 2020-02-12 NOTE — Assessment & Plan Note (Signed)
Chronic to lower back and right knee. Discussed daily use of Extra Strength Tylenol Arthritis.  He will update.

## 2020-02-12 NOTE — Assessment & Plan Note (Signed)
Lungs clear, infrequent use of albuterol inhaler.

## 2020-02-12 NOTE — Assessment & Plan Note (Signed)
Repeat A1C pending. Encouraged a healthy diet, regular activity.

## 2020-02-12 NOTE — Assessment & Plan Note (Signed)
Well controlled in the office today, continue current regimen.

## 2020-02-12 NOTE — Patient Instructions (Addendum)
Stop by the lab prior to leaving today. I will notify you of your results once received.   Be sure to increase daily activity, this will help to reduce your joint aches.  Work on a healthy diet.   Try Tylenol [id number] mg up to twice daily as needed for arthritis.   It was a pleasure to see you today!   Preventive Care 79 Years and Older, Male Preventive care refers to lifestyle choices and visits with your health care provider that can promote health and wellness. This includes:  A yearly physical exam. This is also called an annual well check.  Regular dental and eye exams.  Immunizations.  Screening for certain conditions.  Healthy lifestyle choices, such as diet and exercise. What can I expect for my preventive care visit? Physical exam Your health care provider will check:  Height and weight. These may be used to calculate body mass index (BMI), which is a measurement that tells if you are at a healthy weight.  Heart rate and blood pressure.  Your skin for abnormal spots. Counseling Your health care provider may ask you questions about:  Alcohol, tobacco, and drug use.  Emotional well-being.  Home and relationship well-being.  Sexual activity.  Eating habits.  History of falls.  Memory and ability to understand (cognition).  Work and work Statistician. What immunizations do I need?  Influenza (flu) vaccine  This is recommended every year. Tetanus, diphtheria, and pertussis (Tdap) vaccine  You may need a Td booster every 10 years. Varicella (chickenpox) vaccine  You may need this vaccine if you have not already been vaccinated. Zoster (shingles) vaccine  You may need this after age 79. Pneumococcal conjugate (PCV13) vaccine  One dose is recommended after age 79. Pneumococcal polysaccharide (PPSV23) vaccine  One dose is recommended after age 79. Measles, mumps, and rubella (MMR) vaccine  You may need at least one dose of MMR if you were born in  1957 or later. You may also need a second dose. Meningococcal conjugate (MenACWY) vaccine  You may need this if you have certain conditions. Hepatitis A vaccine  You may need this if you have certain conditions or if you travel or work in places where you may be exposed to hepatitis A. Hepatitis B vaccine  You may need this if you have certain conditions or if you travel or work in places where you may be exposed to hepatitis B. Haemophilus influenzae type b (Hib) vaccine  You may need this if you have certain conditions. You may receive vaccines as individual doses or as more than one vaccine together in one shot (combination vaccines). Talk with your health care provider about the risks and benefits of combination vaccines. What tests do I need? Blood tests  Lipid and cholesterol levels. These may be checked every 5 years, or more frequently depending on your overall health.  Hepatitis C test.  Hepatitis B test. Screening  Lung cancer screening. You may have this screening every year starting at age 79 if you have a 30-pack-year history of smoking and currently smoke or have quit within the past 15 years.  Colorectal cancer screening. All adults should have this screening starting at age 64 and continuing until age 90. Your health care provider may recommend screening at age 74 if you are at increased risk. You will have tests every 1-10 years, depending on your results and the type of screening test.  Prostate cancer screening. Recommendations will vary depending on your family history and  other risks.  Diabetes screening. This is done by checking your blood sugar (glucose) after you have not eaten for a while (fasting). You may have this done every 1-3 years.  Abdominal aortic aneurysm (AAA) screening. You may need this if you are a current or former smoker.  Sexually transmitted disease (STD) testing. Follow these instructions at home: Eating and drinking  Eat a diet that  includes fresh fruits and vegetables, whole grains, lean protein, and low-fat dairy products. Limit your intake of foods with high amounts of sugar, saturated fats, and salt.  Take vitamin and mineral supplements as recommended by your health care provider.  Do not drink alcohol if your health care provider tells you not to drink.  If you drink alcohol: ? Limit how much you have to 0-2 drinks a day. ? Be aware of how much alcohol is in your drink. In the [place] one drink equals one 12 oz bottle of beer (355 mL), one 5 oz glass of wine (148 mL), or one 1 oz glass of hard liquor (44 mL). Lifestyle  Take daily care of your teeth and gums.  Stay active. Exercise for at least 30 minutes on 5 or more days each week.  Do not use any products that contain nicotine or tobacco, such as cigarettes, e-cigarettes, and chewing tobacco. If you need help quitting, ask your health care provider.  If you are sexually active, practice safe sex. Use a condom or other form of protection to prevent STIs (sexually transmitted infections).  Talk with your health care provider about taking a low-dose aspirin or statin. What's next?  Visit your health care provider once a year for a well check visit.  Ask your health care provider how often you should have your eyes and teeth checked.  Stay up to date on all vaccines. This information is not intended to replace advice given to you by your health care provider. Make sure you discuss any questions you have with your health care provider. Document Revised: 03/10/2018 Document Reviewed: 03/10/2018 Elsevier Patient Education  2020 [employer].

## 2020-02-12 NOTE — Assessment & Plan Note (Signed)
Following with cardiology. Recent notes reviewed.

## 2020-02-12 NOTE — Assessment & Plan Note (Signed)
Following with cardiology. Recent notes reviewed.  Appears euvolemic today. Doing well since initiation of furosemide.

## 2020-02-12 NOTE — Assessment & Plan Note (Signed)
Asymptomatic.  Following with cardiology. Continue BP and lipid control.

## 2020-02-12 NOTE — Progress Notes (Signed)
Subjective:    Patient ID: TEREN ZURCHER, male    DOB: 22-Dec-1940, 79 y.o.   MRN: [id number]  HPI  This visit occurred during the SARS-CoV-2 public health emergency.  Safety protocols were in place, including screening questions prior to the visit, additional usage of staff PPE, and extensive cleaning of exam room while observing appropriate contact time as indicated for disinfecting solutions.   Mr. Ghosh is a 79 year old male who presents today for complete physical.  He would also like to discuss chronic right knee pain. He was once told that he needed replacement. Follows with the [facility], is unsure if they do this type of surgery. He is taking Tylenol 1000 mg as needed.   Immunizations: -Influenza: Completed this season  -Shingles: Completed Zostavax -Pneumonia: Completed Prevnar and Pneumovax -Covid-19: Completed series   Diet: He endorses a healthy diet.  Exercise: No regular exercise  Eye exam: No recent exam Dental exam: No recent exam  Colonoscopy: History of colon cancer. Had rectum resected, now with colostomy bag x 16 years.  Hep C Screen: Due  BP Readings from Last 3 Encounters:  02/12/20 134/78  01/02/20 130/60  09/28/19 116/66      Review of Systems  Constitutional: Negative for unexpected weight change.  HENT: Negative for rhinorrhea.   Eyes: Negative for visual disturbance.  Respiratory: Negative for cough and shortness of breath.   Cardiovascular: Negative for chest pain.  Gastrointestinal: Negative for constipation and diarrhea.  Genitourinary: Negative for difficulty urinating.  Musculoskeletal: Positive for arthralgias and back pain.  Skin: Negative for rash.  Allergic/Immunologic: Negative for environmental allergies.  Neurological: Negative for dizziness and headaches.  Psychiatric/Behavioral: The patient is not nervous/anxious.        Past Medical History:  Diagnosis Date  . Agent orange exposure   . Arthritis 03/12/2011  .  Chronic obstructive lung disease ([place])   . Coronary artery disease 03/2013   a. 03/2013 Cath: LM nl, LAD 41m, D1 50, D2 30, LCX 25m/d (chronic), OM1 50, RCA 40p, 75m, RPL 40, RPDA 60, EF 25%, mod-sev MR-->Med mgmt; b. 08/2019 MV: basal inflat/mid inflat defect consistent w/ ischemia in LCX distribution. EF 37%. Intermediate risk.  Marland Kitchen HFrEF (heart failure with reduced ejection fraction) ([place])    a. 03/2013 TEE: EF 25%; b. 08/2017 Echo: EF 45-50%, Gr1 DD. Mod AS. Mod MR. Mildly dil LA; c. 06/2019 Echo: EF 30-35%, glob HK, gr1 DD, RVSP 49.57mmHg. Mildly dil LA. Mod AS (VTI 1.16cm^2, mean grad 62mmHg).  . Hyperlipidemia   . Hypertension   . Ischemic cardiomyopathy    a. 03/2013 TEE: EF 25%; b. 08/2017 Echo: EF 45-50%; c. 06/2019 Echo: EF 30-35%.  . Moderate aortic stenosis    a. 08/2017 Echo: Mod AS. Mean grad (S) 83mmHg. Valve area (VTI): 1.25cm^2; b. 06/2019 Echo: Mod AS. Mean grad 82mmHg, Valve area (VTI): 1.16cm^2.  . Moderate mitral regurgitation    a. 08/2017 Echo: mod MR; b. 06/2019 Echo: mild to mod MR.  . Rectal cancer [facility])      Social History   Socioeconomic History  . Marital status: Married    Spouse name: Not on file  . Number of children: Not on file  . Years of education: Not on file  . Highest education level: Not on file  Occupational History  . Not on file  Tobacco Use  . Smoking status: Former Smoker    Packs/day: 1.00    Years: 50.00    Pack years:  50.00    Types: Cigarettes    Quit date: 03/25/2013    Years since quitting: 6.8  . Smokeless tobacco: Never Used  Vaping Use  . Vaping Use: Never used  Substance and Sexual Activity  . Alcohol use: No    Alcohol/week: 0.0 standard drinks  . Drug use: No  . Sexual activity: Yes  Other Topics Concern  . Not on file  Social History Narrative   Arm '66-'68, [place], agent orange exposure.    Social Determinants of Health   Financial Resource Strain:   . Difficulty of Paying Living Expenses: Not on file  Food Insecurity:    . Worried About Charity fundraiser in the Last Year: Not on file  . Ran Out of Food in the Last Year: Not on file  Transportation Needs:   . Lack of Transportation (Medical): Not on file  . Lack of Transportation (Non-Medical): Not on file  Physical Activity:   . Days of Exercise per Week: Not on file  . Minutes of Exercise per Session: Not on file  Stress:   . Feeling of Stress : Not on file  Social Connections:   . Frequency of Communication with Friends and Family: Not on file  . Frequency of Social Gatherings with Friends and Family: Not on file  . Attends Religious Services: Not on file  . Active Member of Clubs or Organizations: Not on file  . Attends Archivist Meetings: Not on file  . Marital Status: Not on file  Intimate Partner Violence:   . Fear of Current or Ex-Partner: Not on file  . Emotionally Abused: Not on file  . Physically Abused: Not on file  . Sexually Abused: Not on file    Past Surgical History:  Procedure Laterality Date  . APPENDECTOMY  06/2003  . CARDIAC CATHETERIZATION  03/2013   [facility]  . cataract surgery    . COLOSTOMY    . HERNIA REPAIR    . resection of rectum      Family History  Problem Relation Age of Onset  . Heart disease Mother   . Heart disease Father   . Hyperlipidemia Father   . Hypertension Father   . Heart attack Brother 43       MI  . Heart disease Sister        stents placed   . Cancer - Cervical Sister   . Heart disease Sister        CABG    Allergies  Allergen Reactions  . Codeine Nausea Only  . Statins Other (See Comments)    REACTION: maylgias    Current Outpatient Medications on File Prior to Visit  Medication Sig Dispense Refill  . albuterol (VENTOLIN HFA) 108 (90 Base) MCG/ACT inhaler INHALE 2 PUFFS BY MOUTH EVERY 6 HOURS AS NEEDED FOR SHORTNESS OF BREATH 18 g 0  . aspirin 81 MG tablet Take 81 mg by mouth daily. Takes at night     . cholecalciferol (VITAMIN D3) 25 MCG (1000 UNIT) tablet Take  1,000 Units by mouth daily.    Marland Kitchen ezetimibe (ZETIA) 10 MG tablet Take 1 tablet by mouth once daily for cholesterol. NEED APPOINTMENT FOR ANY MORE REFILLS 90 tablet 1  . furosemide (LASIX) 40 MG tablet TAKE 1 TABLET BY MOUTH ONCE DAILY. 90 tablet 0  . Magnesium Oxide 400 (240 Mg) MG TABS TAKE 1 TABLET BY MOUTH ONCE DAILY. 90 tablet 3  . sacubitril-valsartan (ENTRESTO) 49-51 MG Take 1 tablet by  mouth 2 (two) times daily. 180 tablet 0  . spironolactone (ALDACTONE) 25 MG tablet Take 0.5 tablets (12.5 mg total) by mouth daily. 15 tablet 5  . traZODone (DESYREL) 50 MG tablet Take 1 tablet (50 mg total) by mouth at bedtime as needed. for sleep 90 tablet 0  . Alirocumab (PRALUENT) 75 MG/ML SOAJ Inject 1 pen into the skin every 14 (fourteen) days. (Patient not taking: Reported on 02/12/2020) 2 pen 11  . carvedilol (COREG) 12.5 MG tablet Take 1 tablet (12.5 mg total) by mouth 2 (two) times daily. 180 tablet 3   No current facility-administered medications on file prior to visit.    BP 134/78   Pulse 67   Temp 98.1 F (36.7 C) (Temporal)   Resp 15   Ht 5\' 6"  (1.676 m)   Wt 173 lb 0.6 oz (78.5 kg)   SpO2 98%   BMI 27.93 kg/m    Objective:   Physical Exam HENT:     Right Ear: Tympanic membrane and ear canal normal.     Left Ear: Tympanic membrane and ear canal normal.  Eyes:     Pupils: Pupils are equal, round, and reactive to light.  Cardiovascular:     Rate and Rhythm: Normal rate and regular rhythm.  Pulmonary:     Effort: Pulmonary effort is normal.     Breath sounds: Normal breath sounds.  Abdominal:     General: Bowel sounds are normal.     Palpations: Abdomen is soft.     Tenderness: There is no abdominal tenderness.  Musculoskeletal:     Cervical back: Neck supple.     Comments: Decrease in ROM to lumbar spine and right knee  Skin:    General: Skin is warm and dry.  Neurological:     Mental Status: He is alert and oriented to person, place, and time.     Cranial Nerves: No  cranial nerve deficit.     Deep Tendon Reflexes:     Reflex Scores:      Patellar reflexes are 2+ on the right side and 2+ on the left side. Psychiatric:        Mood and Affect: Mood normal.            Assessment & Plan:

## 2020-02-12 NOTE — Assessment & Plan Note (Signed)
Immunizations UTD. Declines colon cancer screening given age. Encouraged regular activity, healthy diet. Exam today stable. Labs pending.

## 2020-02-12 NOTE — Assessment & Plan Note (Signed)
Repeat lipid panel pending. Continue Zetia, Pra[place].

## 2020-02-13 LAB — COMPREHENSIVE METABOLIC PANEL
ALT: 10 U/L (ref 0–53)
AST: 14 U/L (ref 0–37)
Albumin: 4.1 g/dL (ref 3.5–5.2)
Alkaline Phosphatase: 64 U/L (ref 39–117)
BUN: 22 mg/dL (ref 6–23)
CO2: 31 mEq/L (ref 19–32)
Calcium: 9.3 mg/dL (ref 8.4–10.5)
Chloride: 94 mEq/L — ABNORMAL LOW (ref 96–112)
Creatinine, Ser: 0.99 mg/dL (ref 0.40–1.50)
GFR: 72.54 mL/min (ref >60.00)
Glucose, Bld: 75 mg/dL (ref 70–99)
Potassium: 5 mEq/L (ref 3.5–5.1)
Sodium: 132 mEq/L — ABNORMAL LOW (ref 135–145)
Total Bilirubin: 0.9 mg/dL (ref 0.2–1.2)
Total Protein: 6.7 g/dL (ref 6.0–8.3)

## 2020-02-13 LAB — CBC
HCT: 40.7 % (ref 39.0–52.0)
Hemoglobin: 13.6 g/dL (ref 13.0–17.0)
MCHC: 33.3 g/dL (ref 30.0–36.0)
MCV: 91.2 fl (ref 78.0–100.0)
Platelets: 289 10*3/uL (ref 150.0–400.0)
RBC: 4.46 Mil/uL (ref 4.22–5.81)
RDW: 13.8 % (ref 11.5–15.5)
WBC: 9 10*3/uL (ref 4.0–10.5)

## 2020-02-13 LAB — HEMOGLOBIN A1C: Hgb A1c MFr Bld: 6.3 % (ref 4.6–6.5)

## 2020-02-13 LAB — LIPID PANEL
Cholesterol: 185 mg/dL (ref 0–200)
HDL: 53.5 mg/dL (ref >39.00)
LDL Cholesterol: 106 mg/dL — ABNORMAL HIGH (ref 0–99)
NonHDL: 131.37
Total CHOL/HDL Ratio: 3
Triglycerides: 128 mg/dL (ref 0.0–149.0)
VLDL: 25.6 mg/dL (ref 0.0–40.0)

## 2020-03-04 ENCOUNTER — Other Ambulatory Visit: Payer: Self-pay

## 2020-03-04 MED ORDER — ENTRESTO 49-51 MG PO TABS: 1.0000 | ORAL_TABLET | Freq: Two times a day (BID) | ORAL | 0 refills | Status: DC

## 2020-03-04 NOTE — Telephone Encounter (Signed)
*  STAT* If patient is at the pharmacy, call can be transferred to refill team.   1. Which medications need to be refilled? (please list name of each medication and dose if known)  ENTRESTO  2. Which pharmacy/location (including street and city if local pharmacy) is medication to be sent to? [facility]  3. Do they need a 30 day or 90 day supply? [place]

## 2020-04-02 ENCOUNTER — Other Ambulatory Visit: Payer: Self-pay | Admitting: Cardiovascular Disease

## 2020-04-03 ENCOUNTER — Telehealth: Payer: Self-pay | Admitting: *Deleted

## 2020-04-03 NOTE — Telephone Encounter (Signed)
Apolonio Schneiders from [employer] left a voicemail stating that they faxed over an order for patient's ostomy supplies. Apolonio Schneiders wanted to confirm that the order form was received. Apolonio Schneiders stated that they would like the form completed and faxed back. Apolonio Schneiders stated that a verbal order can be taken by telephone. Telephone number [phone]

## 2020-04-03 NOTE — Telephone Encounter (Signed)
I have not received an order form for supplies, was this sent to GI at the [facility]?  Does he still follow with GI annually?

## 2020-04-04 ENCOUNTER — Other Ambulatory Visit: Payer: Self-pay

## 2020-04-04 ENCOUNTER — Encounter: Payer: Self-pay | Admitting: Nurse Practitioner

## 2020-04-04 ENCOUNTER — Ambulatory Visit: Payer: Medicare Other | Admitting: Nurse Practitioner

## 2020-04-04 VITALS — BP 110/62 | HR 68 | Ht 66.0 in | Wt 173.0 lb

## 2020-04-04 DIAGNOSIS — I2510 Atherosclerotic heart disease of native coronary artery without angina pectoris: Secondary | ICD-10-CM | POA: Diagnosis not present

## 2020-04-04 DIAGNOSIS — I10 Essential (primary) hypertension: Secondary | ICD-10-CM

## 2020-04-04 DIAGNOSIS — I350 Nonrheumatic aortic (valve) stenosis: Secondary | ICD-10-CM

## 2020-04-04 DIAGNOSIS — I5022 Chronic systolic (congestive) heart failure: Secondary | ICD-10-CM | POA: Diagnosis not present

## 2020-04-04 DIAGNOSIS — I255 Ischemic cardiomyopathy: Secondary | ICD-10-CM | POA: Diagnosis not present

## 2020-04-04 DIAGNOSIS — E785 Hyperlipidemia, unspecified: Secondary | ICD-10-CM

## 2020-04-04 MED ORDER — ENTRESTO 49-51 MG PO TABS: 1.0000 | ORAL_TABLET | Freq: Two times a day (BID) | ORAL | 6 refills | Status: DC

## 2020-04-04 NOTE — Progress Notes (Signed)
Office Visit    Patient Name: Curtis Campos Date of Encounter: 04/04/2020  Primary Care Provider:  [name], NP Primary Cardiologist:  Kathlyn Sacramento, MD  Chief Complaint    80 year old male with a history of CAD, ischemic cardiomyopathy, HFrEF, moderate aortic stenosis, moderate mitral regurgitation, hypertension, hyperlipidemia with statin intolerance, remote tobacco abuse, and COPD, who presents for follow-up of heart failure.  Past Medical History    Past Medical History:  Diagnosis Date  . Agent orange exposure   . Arthritis 03/12/2011  . Chronic obstructive lung disease ([place])   . Coronary artery disease 03/2013   a. 03/2013 Cath: LM nl, LAD 39m, D1 50, D2 30, LCX 40m/d (chronic), OM1 50, RCA 40p, 61m, RPL 40, RPDA 60, EF 25%, mod-sev MR-->Med mgmt; b. 08/2019 MV: basal inflat/mid inflat defect consistent w/ ischemia in LCX distribution. EF 37%. Intermediate risk.  Marland Kitchen HFrEF (heart failure with reduced ejection fraction) ([place])    a. 03/2013 TEE: EF 25%; b. 08/2017 Echo: EF 45-50%, Gr1 DD. Mod AS. Mod MR. Mildly dil LA; c. 06/2019 Echo: EF 30-35%, glob HK, gr1 DD, RVSP 49.15mmHg. Mildly dil LA. Mod AS (VTI 1.16cm^2, mean grad 69mmHg).  . Hyperlipidemia   . Hypertension   . Ischemic cardiomyopathy    a. 03/2013 TEE: EF 25%; b. 08/2017 Echo: EF 45-50%; c. 06/2019 Echo: EF 30-35%.  . Moderate aortic stenosis    a. 08/2017 Echo: Mod AS. Mean grad (S) 74mmHg. Valve area (VTI): 1.25cm^2; b. 06/2019 Echo: Mod AS. Mean grad 46mmHg, Valve area (VTI): 1.16cm^2.  . Moderate mitral regurgitation    a. 08/2017 Echo: mod MR; b. 06/2019 Echo: mild to mod MR.  . Rectal cancer [facility])    Past Surgical History:  Procedure Laterality Date  . APPENDECTOMY  06/2003  . CARDIAC CATHETERIZATION  03/2013   [facility]  . cataract surgery    . COLOSTOMY    . HERNIA REPAIR    . resection of rectum      Allergies  Allergies  Allergen Reactions  . Codeine Nausea Only  . Statins Other (See Comments)     REACTION: maylgias    History of Present Illness    80 year old male with the above complex past medical history including HFrEF, ischemic cardiomyopathy, moderate aortic stenosis, moderate mitral regurgitation, hypertension, hyperlipidemia with statin intolerance, rectal cancer, remote tobacco abuse, and COPD.  In January 2015, he was diagnosed with cardiomyopathy after echocardiogram showed an EF of 25% with moderate mitral regurgitation.  Diagnostic catheterization showed severe one-vessel CAD involving a subtotal occlusion of the left circumflex, which appeared to be chronic.  He otherwise had nonobstructive disease.  There was concern for moderate to severe mitral regurgitation however, his follow-up TEE showed only moderate mitral regurgitation.  He has since been medically managed.  EF was 45 to 50% in June 2019.  Most recent echocardiogram in April 2021 showed an EF of 30-35% with global hypokinesis, grade 1 diastolic dysfunction, moderate aortic stenosis, mild to moderate MR, and RVSP of 49.4 mmHg.  Lexiscan Myoview was obtained in early June showing a medium defect of moderate severity in the basal inferolateral and mid inferolateral location consistent with ischemia in the circumflex distribution.  EF is 37%.  Given known occlusion of the circumflex and absence of chest pain, ongoing medical therapy was recommended.  He was last seen in cardiology clinic in October 2021, at which time low-dose spironolactone was added to his regimen.  He had stable renal function  and electrolytes on follow-up lab work November 15, with potassium of 5.0.  Since his last visit, he has been stable.  He has some degree of chronic dyspnea on exertion which is unchanged.  He has been dealing with right knee pain, which has been limiting ambulation.  He does use a cane to get around at home but does not walk much.  He has been tolerating spironolactone since it was started.  He recently refilled his Delene Loll and it  cost him $247, up $100 from last month.  He has asked [place] if we can reach out to the [place] to see if he is able to get this through them.  He has not been having any chest pain and denies palpitations, PND, orthopnea, dizziness, syncope, edema, or early satiety.  Home Medications    Prior to Admission medications   Medication Sig Start Date End Date Taking? Authorizing Provider  albuterol (VENTOLIN HFA) 108 (90 Base) MCG/ACT inhaler INHALE 2 PUFFS BY MOUTH EVERY 6 HOURS AS NEEDED FOR SHORTNESS OF BREATH 08/29/18   [name], NP  Alirocumab (PRALUENT) 75 MG/ML SOAJ Inject 1 pen into the skin every 14 (fourteen) days. Patient not taking: Reported on 02/12/2020 08/25/18   Wellington Hampshire, MD  aspirin 81 MG tablet Take 81 mg by mouth daily. Takes at night     [provider]  carvedilol (COREG) 12.5 MG tablet Take 1 tablet (12.5 mg total) by mouth 2 (two) times daily. 08/07/19 01/02/20  Wellington Hampshire, MD  cholecalciferol (VITAMIN D3) 25 MCG (1000 UNIT) tablet Take 1,000 Units by mouth daily.    [provider]  ENTRESTO 49-51 MG TAKE 1 TABLET BY MOUTH TWICE DAILY 04/02/20   Loel Dubonnet, NP  ezetimibe (ZETIA) 10 MG tablet Take 1 tablet by mouth once daily for cholesterol. NEED APPOINTMENT FOR ANY MORE REFILLS 08/29/18   [name], NP  furosemide (LASIX) 40 MG tablet TAKE 1 TABLET BY MOUTH ONCE DAILY. 01/25/20   Wellington Hampshire, MD  Magnesium Oxide 400 (240 Mg) MG TABS TAKE 1 TABLET BY MOUTH ONCE DAILY. 05/03/17   Wellington Hampshire, MD  spironolactone (ALDACTONE) 25 MG tablet Take 0.5 tablets (12.5 mg total) by mouth daily. 01/03/20 04/02/20  Wellington Hampshire, MD  traZODone (DESYREL) 50 MG tablet Take 1 tablet (50 mg total) by mouth at bedtime as needed. for sleep 08/29/18   [name], NP    Review of Systems    Chronic, stable dyspnea on exertion.  He denies chest pain, palpitations, PND, orthopnea, dizziness, syncope, edema, or early satiety.  He has chronic  right knee arthritic pain which does limit ambulation some..  All other systems reviewed and are otherwise negative except as noted above.  Physical Exam    VS:  BP 110/62 (BP Location: Right Arm, Patient Position: Sitting, Cuff Size: Normal)   Pulse 68   Ht 5\' 6"  (1.676 m)   Wt 173 lb (78.5 kg)   SpO2 93%   BMI 27.92 kg/m  , BMI Body mass index is 27.92 kg/m. GEN: Well nourished, well developed, in no acute distress. HEENT: normal. Neck: Supple, no JVD, carotid bruits, or masses. Cardiac: RRR, 2/6 SEM @ upper sternal borders and 2/6 blowing quality syst murmur @ apex, no rubs, or gallops. No clubbing, cyanosis, edema.  Radials 2+/PT 1+ and equal bilaterally.  Respiratory:  Respirations regular and unlabored, clear to auscultation bilaterally. GI: Soft, nontender, nondistended, BS + x 4. MS:  no deformity or atrophy. Skin: warm and dry, no rash. Neuro:  Strength and sensation are intact. Psych: Normal affect.  Accessory Clinical Findings    ECG personally reviewed by me today -regular sinus rhythm, 68, PACs nonspecific ST and T changes- no acute changes.  Lab Results  Component Value Date   WBC 9.0 02/12/2020   HGB 13.6 02/12/2020   HCT 40.7 02/12/2020   MCV 91.2 02/12/2020   PLT 289.0 02/12/2020   Lab Results  Component Value Date   CREATININE 0.99 02/12/2020   BUN 22 02/12/2020   NA 132 (L) 02/12/2020   K 5.0 02/12/2020   CL 94 (L) 02/12/2020   CO2 31 02/12/2020   Lab Results  Component Value Date   ALT 10 02/12/2020   AST 14 02/12/2020   ALKPHOS 64 02/12/2020   BILITOT 0.9 02/12/2020   Lab Results  Component Value Date   CHOL 185 02/12/2020   HDL 53.50 02/12/2020   LDLCALC 106 (H) 02/12/2020   LDLDIRECT 129.7 04/14/2012   TRIG 128.0 02/12/2020   CHOLHDL 3 02/12/2020    Lab Results  Component Value Date   HGBA1C 6.3 02/12/2020    Assessment & Plan    1.  Chronic heart failure with reduced ejection fraction/ischemic cardiomyopathy: EF of 30-35% in  April 2021.  He has been stable with chronic dyspnea on exertion since his last visit and is euvolemic on examination today.  He remains on carvedilol, Entresto, spironolactone, and Lasix.  He pointed out today that his recent Entresto refill cost him $247.  He gets many of his medications through the [place] but for some reason, he has not been able to get Entresto through there at this time.  In light of his financial concerns, we agreed not to try and add an SGLT2 inhibitor today.  After clinic, we were able to reach out to the [place] and have faxed in a prescription for Entresto.  If physician staff there approves it, he will be able to get Entresto through the [place] going forward.  I will follow-up a basic metabolic panel today given a potassium of 5.0 in November after starting spironolactone.  2.  Coronary artery disease: Doing well without angina.  Myoview in June 2021 showed evidence of ischemia in the circumflex distribution, which is to be expected given known chronic subtotal occlusion of the mid left circumflex.  Continue medical therapy including aspirin, beta-blocker, and Praluent therapy.  3.  Essential hypertension: Stable on beta-blocker, Entresto, spironolactone, and Lasix.  4.  Hyperlipidemia: Intolerant to statins.  He remains on Praluent and Zetia with an LDL of 106 in November.  5.  Moderate aortic stenosis and moderate mitral regurgitation: Last echo was April 2021.  We can arrange for repeat echo prior to next visit.  6.  Disposition: Follow-up basic metabolic panel today.  Follow-up echo in April and office visit in May.   Murray Hodgkins, NP 04/04/2020, 6:41 PM

## 2020-04-04 NOTE — Patient Instructions (Signed)
Medication Instructions:   Your physician recommends that you continue on your current medications as directed. Please refer to the Current Medication list given to you today.  *If you need a refill on your cardiac medications before your next appointment, please call your pharmacy*   Lab Work: Your physician recommends that you have lab work TODAY: Bmet  If you have labs (blood work) drawn today and your tests are completely normal, you will receive your results only by: Marland Kitchen MyChart Message (if you have MyChart) OR . A paper copy in the mail If you have any lab test that is abnormal or we need to change your treatment, we will call you to review the results.   Testing/Procedures: None ordered   Follow-Up: At [facility], you and your health needs are our priority.  As part of our continuing mission to provide you with exceptional heart care, we have created designated Provider Care Teams.  These Care Teams include your primary Cardiologist (physician) and Advanced Practice Providers (APPs -  Physician Assistants and Nurse Practitioners) who all work together to provide you with the care you need, when you need it.  We recommend signing up for the patient portal called "MyChart".  Sign up information is provided on this After Visit Summary.  MyChart is used to connect with patients for Virtual Visits (Telemedicine).  Patients are able to view lab/test results, encounter notes, upcoming appointments, etc.  Non-urgent messages can be sent to your provider as well.   To learn more about what you can do with MyChart, go to [email or web address].    Your next appointment:   3 - 4  month(s)  The format for your next appointment:   In Person  Provider:   You may see Kathlyn Sacramento, MD or one of the following Advanced Practice Providers on your designated Care Team:    Murray Hodgkins, NP

## 2020-04-05 ENCOUNTER — Telehealth: Payer: Self-pay | Admitting: *Deleted

## 2020-04-05 ENCOUNTER — Telehealth: Payer: Self-pay | Admitting: Primary Care

## 2020-04-05 LAB — BASIC METABOLIC PANEL
BUN/Creatinine Ratio: 18 (ref 10–24)
BUN: 19 mg/dL (ref 8–27)
CO2: 23 mmol/L (ref 20–29)
Calcium: 9.5 mg/dL (ref 8.6–10.2)
Chloride: 97 mmol/L (ref 96–106)
Creatinine, Ser: 1.03 mg/dL (ref 0.76–1.27)
GFR calc Af Amer: 79 mL/min/{1.73_m2} (ref >59)
GFR calc non Af Amer: 69 mL/min/{1.73_m2} (ref >59)
Glucose: 105 mg/dL — ABNORMAL HIGH (ref 65–99)
Potassium: 4.6 mmol/L (ref 3.5–5.2)
Sodium: 135 mmol/L (ref 134–144)

## 2020-04-05 NOTE — Telephone Encounter (Signed)
-----   Message from Theora Gianotti, NP sent at 04/05/2020  7:07 AM EST ----- Kidney function and electrolytes look good.  Dr. Fletcher Anon would like for him to have an echo in April to re-eval Aortic Stenosis.  I've entered the order.

## 2020-04-05 NOTE — Telephone Encounter (Signed)
Left voicemail message to call back regarding results and recommendations.  

## 2020-04-05 NOTE — Telephone Encounter (Signed)
Patient is requesting refill on Coloscopy supplies   [phone]

## 2020-04-05 NOTE — Telephone Encounter (Signed)
Patient returning call to go over lab results .    Scheduled for echo while on the line.

## 2020-04-05 NOTE — Telephone Encounter (Signed)
Called office was on hold for over 30 min have not received ppw that I am aware of.

## 2020-04-05 NOTE — Telephone Encounter (Signed)
Patients wife states he has left the house but reviewed results with her and they did schedule echocardiogram. She will update him that labs were good and advised if he should have questions he could call back. She states that she will tell him and no need to call back. She was appreciative for the call with no further questions at this time.

## 2020-04-06 NOTE — Telephone Encounter (Signed)
Received ppw placed in folder does he need to get from GI?

## 2020-04-06 NOTE — Telephone Encounter (Signed)
See other message

## 2020-04-08 NOTE — Telephone Encounter (Signed)
Noted and completed. Placed in [place]

## 2020-04-09 NOTE — Telephone Encounter (Signed)
Ppw faxed

## 2020-05-07 ENCOUNTER — Other Ambulatory Visit: Payer: Self-pay | Admitting: Cardiovascular Disease

## 2020-05-09 ENCOUNTER — Ambulatory Visit: Payer: Medicare (Managed Care) | Admitting: Family Medicine

## 2020-05-13 ENCOUNTER — Other Ambulatory Visit: Payer: Self-pay

## 2020-05-13 ENCOUNTER — Encounter: Payer: Self-pay | Admitting: Family Medicine

## 2020-05-13 ENCOUNTER — Ambulatory Visit: Payer: Medicare (Managed Care) | Admitting: Family Medicine

## 2020-05-13 ENCOUNTER — Ambulatory Visit (INDEPENDENT_AMBULATORY_CARE_PROVIDER_SITE_OTHER)
Admission: RE | Admit: 2020-05-13 | Discharge: 2020-05-13 | Disposition: A | Discharge status: Home / Self Care | Payer: Medicare (Managed Care) | Source: Ambulatory Visit | Attending: Family Medicine | Admitting: Family Medicine

## 2020-05-13 VITALS — BP 90/64 | HR 69 | Temp 97.6°F | Ht 66.0 in | Wt 173.2 lb

## 2020-05-13 DIAGNOSIS — M1711 Unilateral primary osteoarthritis, right knee: Secondary | ICD-10-CM

## 2020-05-13 DIAGNOSIS — M25561 Pain in right knee: Secondary | ICD-10-CM

## 2020-05-13 DIAGNOSIS — M25461 Effusion, right knee: Secondary | ICD-10-CM | POA: Diagnosis not present

## 2020-05-13 MED ORDER — TRIAMCINOLONE ACETONIDE 40 MG/ML IJ SUSP
40.0000 mg | Freq: Once | INTRAMUSCULAR | Status: AC
  Administered 2020-05-13: 40 mg via INTRA_ARTICULAR

## 2020-05-13 NOTE — Progress Notes (Signed)
Curtis Campos T. Curtis Pieczynski, MD, [place]  Primary Care and Sports Medicine [facility] at [facility] [place], [address]  Phone: [phone]  FAX: [phone]  ARTIST BLOOM - 80 y.o. male  MRN [id number]  Date of Birth: June 14, 1940  Date: 05/13/2020  PCP: [name], NP  Referral: [name], NP  Chief Complaint  Patient presents with  . Knee Pain    Bilateral  . Hip Pain    Right    This visit occurred during the SARS-CoV-2 public health emergency.  Safety protocols were in place, including screening questions prior to the visit, additional usage of staff PPE, and extensive cleaning of exam room while observing appropriate contact time as indicated for disinfecting solutions.   Subjective:   Curtis Campos is a 80 y.o. very pleasant male patient with Body mass index is 27.96 kg/m. who presents with the following:  Curtis Campos presents today, and he also presents with his son who provides additional history.  He has some known arthritic changes that were quite significant in bilateral knees.  His greatest pain is on the right, and he has a notable effusion.  He did twist his knee somewhat very recently, but he has had no traumatic injury.  This is been in slow progression over years with some knee pain and decreased function.  He also has some pain of the left knee, but his left is not nearly as uncomfortable as the right knee.  He also has some pain in the posterior pelvis with no significant lateral pain and no pain in his groin.  r knee Asp 40 cc seros Inject knee  Review of Systems is noted in the HPI, as appropriate   Objective:   BP 90/64   Pulse 69   Temp 97.6 F (36.4 C) (Temporal)   Ht 5\' 6"  (1.676 m)   Wt 173 lb 4 oz (78.6 kg)   SpO2 95%   BMI 27.96 kg/m   Right knee: Lacks 2 degrees of extension and flexion to 95 degrees.  He does have a large ballotable effusion.  Stable to varus and  valgus stress.  ACL and PCL are intact.  He does have medial and lateral joint line tenderness.  Any form of forced flexion or extension causes significant pain.  Left knee is less painful with manipulation, and he is able to flex this to approximately 105 degrees.  Bilateral hip exam, patient does lack some degree of motion, but any form of rotational movement with the hip flexed to 90 does not provoke any pain.  He has no pain at the [place].  Does have some mild pain in the posterior pelvis and with palpation of the deep musculature.  Radiology: CLINICAL DATA:  Left lateral hip pain.  Questionable osteoarthritis.  EXAM: DG HIP (WITH OR WITHOUT PELVIS) 2-3V LEFT  COMPARISON:  Coronal and sagittal reconstructed images through the pelvis and hips from an abdominal and pelvic CT scan of June 30, 2007  FINDINGS: The bony pelvis is subjectively adequately mineralized for age. There is no lytic nor blastic lesion. AP and lateral views of the left hip reveal reasonable preservation of the joint space. The articular surfaces of the left femoral head and acetabulum remains smoothly rounded. The femoral neck, intertrochanteric, and subtrochanteric regions are normal.  IMPRESSION: There is no acute or significant chronic bony abnormality of the left hip.   Electronically Signed   By: David  Martinique M.D.  On: 01/12/2018 15:31  DG Knee 4 Views W/Patella Right  Result Date: 05/13/2020 CLINICAL DATA:  Acute on chronic knee pain, effusion EXAM: RIGHT KNEE - COMPLETE 4+ VIEW COMPARISON:  01/12/2018 FINDINGS: Frontal, tunnel, lateral, and sunrise views of the right knee are obtained. There is progressive severe medial compartmental osteoarthritis with complete loss of joint space and bone on bone contact. Moderate osteoarthritis within the lateral and patellofemoral compartments. Small joint effusion. Loose bodies are seen posteromedial to the right knee. Extensive vascular calcifications.  Limited imaging of the left knee demonstrates stable medial and lateral compartmental osteoarthritis. IMPRESSION: 1. Progressive right knee osteoarthritis, now severe within the medial compartment with complete loss of joint space and bone on bone contact. 2. Small right knee effusion. 3. Stable synovial osteochondromatosis. Electronically Signed   By: [name] M.D.   On: 05/13/2020 21:19    Assessment and Plan:     ICD-10-CM   1. Primary osteoarthritis of right knee  M17.11 DG Knee 4 Views W/Patella Right    triamcinolone acetonide (KENALOG-40) injection 40 mg  2. Acute pain of right knee  M25.561 DG Knee 4 Views W/Patella Right  3. Effusion, right knee  M25.461    He does have acute on chronic knee arthritis with a flareup.  I do think that his knee arthritis has intervally progressed compared to his prior radiographs.  He does have a end-stage arthritic changes of the right knee.  Ultimately, for long-lasting relief of symptoms I am doubtful anything short of a total knee arthroplasty would provide him with some significant relief.  For now, I do think that doing some measures to provide him some short-term relief is reasonable.  I think that he would respond to an aspiration and injection of his right knee.  Think that the hip pain is secondary, and I am not concerned.  He also does have some left-sided knee osteoarthritis, but this is less symptomatic today.  Aspiration/Injection Procedure Note GRAY DOERING Aug 11, 1940 Date of procedure: 05/13/2020  Procedure: Large Joint Aspiration / Injection with synovial fluid aspiration of knee, right Indications: Pain  Procedure Details Patient verbally consented; risks, benefits, and alternatives explained. Patient prepped with Chloraprep. Ethyl chloride for anesthesia. 10 cc of 1% Lidocaine used in wheal then injected Subcutaneous fashion with 22 gauge needle on lateral approach. Under sterilne conditions, 18 gauge needle used via  lateral approach to aspirate 40 cc of serosanguineous fluid. Then 9 cc of Lidocaine 1% and 1 mL of Kenalog 40 mg injected. Tolerated well, decreased pain, no complications. Medication: 1 mL of Kenalog 40 mg  Meds ordered this encounter  Medications  . triamcinolone acetonide (KENALOG-40) injection 40 mg   There are no discontinued medications. Orders Placed This Encounter  Procedures  . DG Knee 4 Views W/Patella Right    Follow-up: As needed only  Signed,  Guila Owensby T. Saray Capasso, MD   Outpatient Encounter Medications as of 05/13/2020  Medication Sig  . albuterol (VENTOLIN HFA) 108 (90 Base) MCG/ACT inhaler INHALE 2 PUFFS BY MOUTH EVERY 6 HOURS AS NEEDED FOR SHORTNESS OF BREATH  . Alirocumab (PRALUENT) 75 MG/ML SOAJ Inject 1 pen into the skin every 14 (fourteen) days.  Marland Kitchen aspirin 81 MG tablet Take 81 mg by mouth daily. Takes at night  . carvedilol (COREG) 12.5 MG tablet TAKE 1 TABLET BY MOUTH TWICE DAILY  . cholecalciferol (VITAMIN D3) 25 MCG (1000 UNIT) tablet Take 1,000 Units by mouth daily.  . diclofenac Sodium (VOLTAREN) 1 % GEL APPLY  4 GRAMS TO AFFECTED AREA FOUR TIMES A DAY AS NEEDED FOR HIP PAIN-TOTAL DAILY DOSE SHOULD NOT EXCEED 32G PER DAY OVER ALL AFFECTEDJOINTS.FOR PAIN.  Marland Kitchen ezetimibe (ZETIA) 10 MG tablet Take 1 tablet by mouth once daily for cholesterol. NEED APPOINTMENT FOR ANY MORE REFILLS  . furosemide (LASIX) 40 MG tablet TAKE 1 TABLET BY MOUTH ONCE DAILY.  . Magnesium Oxide 400 (240 Mg) MG TABS TAKE 1 TABLET BY MOUTH ONCE DAILY.  . metoprolol (TOPROL-XL) 200 MG 24 hr tablet Take 0.5 tablets by mouth daily.  . rosuvastatin (CRESTOR) 40 MG tablet TAKE ONE TABLET BY MOUTH ONCE DAILY FOR CHOLESTEROL  . sacubitril-valsartan (ENTRESTO) 49-51 MG Take 1 tablet by mouth 2 (two) times daily.  . traZODone (DESYREL) 50 MG tablet Take 1 tablet (50 mg total) by mouth at bedtime as needed. for sleep  . spironolactone (ALDACTONE) 25 MG tablet Take 0.5 tablets (12.5 mg total) by mouth  daily.  . [EXPIRED] triamcinolone acetonide (KENALOG-40) injection 40 mg    No facility-administered encounter medications on file as of 05/13/2020.

## 2020-05-14 ENCOUNTER — Telehealth: Payer: Self-pay | Admitting: Primary Care

## 2020-05-14 ENCOUNTER — Encounter: Payer: Self-pay | Admitting: Family Medicine

## 2020-05-14 NOTE — Telephone Encounter (Signed)
Pt wife called in wanted to know if Dr. Edilia Bo and send in a 20mg  of prednisone its for his hip

## 2020-05-15 MED ORDER — PREDNISONE 20 MG PO TABS
ORAL_TABLET | ORAL | 0 refills | Status: DC
Start: 2020-05-15 — End: 2020-07-03

## 2020-05-15 NOTE — Telephone Encounter (Signed)
Reasonable  Prednisone 20 mg, 2 tabs po for 5 days, then 1 tab po for 5 days, #15, 0 ref

## 2020-05-15 NOTE — Telephone Encounter (Signed)
Prednisone prescription sent to [facility] as instructed by Dr. Lorelei Pont.  Mrs. Arnaud notified of this via telephone.

## 2020-05-29 ENCOUNTER — Other Ambulatory Visit: Payer: Self-pay | Admitting: Cardiovascular Disease

## 2020-07-03 ENCOUNTER — Other Ambulatory Visit: Payer: Self-pay

## 2020-07-03 ENCOUNTER — Ambulatory Visit: Payer: Medicare (Managed Care) | Admitting: Family Medicine

## 2020-07-03 ENCOUNTER — Encounter: Payer: Self-pay | Admitting: Family Medicine

## 2020-07-03 ENCOUNTER — Ambulatory Visit (INDEPENDENT_AMBULATORY_CARE_PROVIDER_SITE_OTHER)
Admission: RE | Admit: 2020-07-03 | Discharge: 2020-07-03 | Disposition: A | Discharge status: Home / Self Care | Payer: Medicare (Managed Care) | Source: Ambulatory Visit | Attending: Family Medicine | Admitting: Family Medicine

## 2020-07-03 VITALS — BP 103/62 | HR 62 | Temp 97.5°F | Ht 66.0 in | Wt 171.0 lb

## 2020-07-03 DIAGNOSIS — M19012 Primary osteoarthritis, left shoulder: Secondary | ICD-10-CM

## 2020-07-03 DIAGNOSIS — M25512 Pain in left shoulder: Secondary | ICD-10-CM

## 2020-07-03 DIAGNOSIS — M75102 Unspecified rotator cuff tear or rupture of left shoulder, not specified as traumatic: Secondary | ICD-10-CM | POA: Diagnosis not present

## 2020-07-03 MED ORDER — TRIAMCINOLONE ACETONIDE 40 MG/ML IJ SUSP
40.0000 mg | Freq: Once | INTRAMUSCULAR | Status: AC
  Administered 2020-07-03: 40 mg via INTRA_ARTICULAR

## 2020-07-03 NOTE — Patient Instructions (Signed)
Lidocaine 4% Cream - you can put it anywhere.

## 2020-07-03 NOTE — Progress Notes (Signed)
Curtis Davidovich T. Destine Ambroise, MD, [place]  Primary Care and Sports Medicine [facility] at [facility] [place], [address]  Phone: [phone]  FAX: [phone]  Curtis Campos - 80 y.o. male  MRN [id number]  Date of Birth: 19-Dec-1940  Date: 07/03/2020  PCP: [name], NP  Referral: [name], NP  Chief Complaint  Patient presents with  . Shoulder Pain    Left    This visit occurred during the SARS-CoV-2 public health emergency.  Safety protocols were in place, including screening questions prior to the visit, additional usage of staff PPE, and extensive cleaning of exam room while observing appropriate contact time as indicated for disinfecting solutions.   Subjective:   Curtis Campos is a 80 y.o. very pleasant male patient with Body mass index is 27.6 kg/m. who presents with the following:  He presents with some ongoing left-sided shoulder pain.  80 year old gentleman with some significant congestive heart failure with the most recent ejection fraction of 30% on his April 2021 echocardiogram.  He fell several months ago, and he did hurt his left shoulder.  Since then he has had some quite severe pain with particularly in the plane of abduction beyond about 40 degrees.  He also has loss of motion in the right side.  He does use a rolling walker at baseline.  He does tell me though at baseline he does have some pain with movement of the shoulder, this is just gotten quite a bit worse recently.  He does not have any known history of prior fracture.  At baseline he is not very active, and he does have to use assistive device for ambulation in general.  He is here with his wife who provides additional history.  Review of Systems is noted in the HPI, as appropriate   Objective:   BP 103/62   Pulse 62   Temp (!) 97.5 F (36.4 C) (Temporal)   Ht 5\' 6"  (1.676 m)   Wt 171 lb (77.6 kg)   SpO2 96%   BMI 27.60  kg/m   GEN: No acute distress; alert,appropriate. PULM: Breathing comfortably in no respiratory distress PSYCH: Normally interactive.    Ambulates to the examination table with assistance.  Left shoulder: He is able to flex his shoulder to approximately 80 degrees passively, and abduct his shoulder passively to 80 degrees, however he is limited in active plane of motion to about 45 degrees.  He has a positive drop test.  Left: Strength in the plane of abduction on the left is 2+/5 External rotation is 3/5 on the left Internal range of motion is 4/5  Right: On the contralateral side the patient's abduction is to 150 with strength at 4/5 Flexion to 150 with strength 4/5 Internal and external rotation full with strength 4+/5  Radiology: No results found.  Assessment and Plan:     ICD-10-CM   1. Acute pain of left shoulder  M25.512 DG Shoulder Left    triamcinolone acetonide (KENALOG-40) injection 40 mg  2. Localized osteoarthritis of left shoulder  M19.012   3. Non-traumatic rotator cuff tear, left  M75.102    On his plain x-ray the patient has minimal to no glenohumeral osteoarthritis.  He does look like he has a high riding humeral head suggestive of prior rotator cuff tear.  He is physically frail requiring a rolling walker to get around, he also has an EF of 30%.  I think most  appropriately he should be managed conservatively to the best of his ability.  He does not want to do physical therapy right now, and I think working on some basic range of motion and isometrics is very much appropriate.  I did an intra-articular shoulder injection hopefully provide him some symptomatic relief.  His prognosis is fair, but hopefully he will be able to compensate long-term.  Patient Instructions  Lidocaine 4% Cream - you can put it anywhere.     Intraarticular Shoulder Aspiration/Injection Procedure Note Curtis Campos [birth year] Date of procedure: 07/03/2020  Procedure: Large  Joint Aspiration / Injection of Shoulder, Intraarticular, L Indications: Pain  Procedure Details Verbal consent was obtained from the patient. Risks explained and contrasted with benefits and alternatives. Patient prepped with Chloraprep and Ethyl Chloride used for anesthesia. An intraarticular shoulder injection was performed using the posterior approach; needle placed into joint capsule without difficulty. The patient tolerated the procedure well and had decreased pain post injection. No complications. Injection: 9 cc of Lidocaine 1% and 1 mL Kenalog 40 mg. Needle: 21 gauge, 2 inch   Meds ordered this encounter  Medications  . triamcinolone acetonide (KENALOG-40) injection 40 mg   Medications Discontinued During This Encounter  Medication Reason  . predniSONE (DELTASONE) 20 MG tablet Completed Course   Orders Placed This Encounter  Procedures  . DG Shoulder Left    Follow-up: No follow-ups on file.  Signed,  Maud Deed. Asah Lamay, MD   Outpatient Encounter Medications as of 07/03/2020  Medication Sig  . albuterol (VENTOLIN HFA) 108 (90 Base) MCG/ACT inhaler INHALE 2 PUFFS BY MOUTH EVERY 6 HOURS AS NEEDED FOR SHORTNESS OF BREATH  . Alirocumab (PRALUENT) 75 MG/ML SOAJ Inject 1 pen into the skin every 14 (fourteen) days.  Marland Kitchen aspirin 81 MG tablet Take 81 mg by mouth daily. Takes at night  . carvedilol (COREG) 12.5 MG tablet TAKE 1 TABLET BY MOUTH TWICE DAILY  . cholecalciferol (VITAMIN D3) 25 MCG (1000 UNIT) tablet Take 1,000 Units by mouth daily.  . diclofenac Sodium (VOLTAREN) 1 % GEL APPLY 4 GRAMS TO AFFECTED AREA FOUR TIMES A DAY AS NEEDED FOR HIP PAIN-TOTAL DAILY DOSE SHOULD NOT EXCEED 32G PER DAY OVER ALL AFFECTEDJOINTS.FOR PAIN.  Marland Kitchen ezetimibe (ZETIA) 10 MG tablet Take 1 tablet by mouth once daily for cholesterol. NEED APPOINTMENT FOR ANY MORE REFILLS  . furosemide (LASIX) 40 MG tablet TAKE 1 TABLET BY MOUTH ONCE DAILY.  . Magnesium Oxide 400 (240 Mg) MG TABS TAKE 1 TABLET BY MOUTH  ONCE DAILY.  . metoprolol (TOPROL-XL) 200 MG 24 hr tablet Take 0.5 tablets by mouth daily.  . rosuvastatin (CRESTOR) 40 MG tablet TAKE ONE TABLET BY MOUTH ONCE DAILY FOR CHOLESTEROL  . sacubitril-valsartan (ENTRESTO) 49-51 MG Take 1 tablet by mouth 2 (two) times daily.  Marland Kitchen spironolactone (ALDACTONE) 25 MG tablet TAKE (1/2) TABLET BY MOUTH ONCE DAILY.  . traZODone (DESYREL) 50 MG tablet Take 1 tablet (50 mg total) by mouth at bedtime as needed. for sleep  . [DISCONTINUED] predniSONE (DELTASONE) 20 MG tablet Take 2 tabs po for 5 days, then 1 tab po for 5 days  . [EXPIRED] triamcinolone acetonide (KENALOG-40) injection 40 mg    No facility-administered encounter medications on file as of 07/03/2020.

## 2020-07-04 ENCOUNTER — Encounter: Payer: Self-pay | Admitting: Family Medicine

## 2020-07-23 ENCOUNTER — Telehealth: Payer: Self-pay

## 2020-07-23 ENCOUNTER — Other Ambulatory Visit: Payer: Self-pay

## 2020-07-23 ENCOUNTER — Ambulatory Visit (INDEPENDENT_AMBULATORY_CARE_PROVIDER_SITE_OTHER): Payer: Medicare (Managed Care)

## 2020-07-23 DIAGNOSIS — I350 Nonrheumatic aortic (valve) stenosis: Secondary | ICD-10-CM | POA: Diagnosis not present

## 2020-07-23 LAB — ECHOCARDIOGRAM COMPLETE
AR max vel: 1.07 cm2
AV Area VTI: 1.25 cm2
AV Area mean vel: 1.09 cm2
AV Mean grad: 22 mmHg
AV Peak grad: 28.3 mmHg
Ao pk vel: 2.66 m/s
Calc EF: 53.2 %
Single Plane A2C EF: 51.1 %
Single Plane A4C EF: 56.4 %

## 2020-07-23 NOTE — Telephone Encounter (Signed)
Patient in office for an Echo, had brought in a bottle of his Spironolactone (he takes 12.5 mg QD)  and stated that he feels "bleakish" in the morning after taking it. He though his BP reading was as low as 50/40 a day or two ago. He stated that they do get their BP cuff calibrated at their pharmacy frequently. BP was taken in office and was found to be 100/58. I advised the patient try a sports drink like a gatorade, and to be sure to stay hydrated throughout the day. His wife stated that he does not drink much fluid and he is also on Lasix.   I advised that I would forward this information to Dr. Fletcher Anon for any further recommendations.

## 2020-07-24 ENCOUNTER — Telehealth: Payer: Self-pay | Admitting: *Deleted

## 2020-07-24 NOTE — Telephone Encounter (Signed)
Patient returning call results

## 2020-07-24 NOTE — Telephone Encounter (Signed)
He can hold Aldactone for a week and see if he feels better without it.

## 2020-07-24 NOTE — Telephone Encounter (Signed)
-----   Message from Theora Gianotti, NP sent at 07/24/2020 12:04 PM EDT ----- Normal heart squeezing function, which is a significant improvement over prior recording - great news!  The aortic valve is moderately tight, which is stable from last year.  In the setting of improved heart squeezing function, the mitral valve is now only trivially leaky.

## 2020-07-24 NOTE — Telephone Encounter (Signed)
Reviewed results with patient and confirmed his upcoming appointment. He verbalized understanding with no further questions at this time.

## 2020-07-24 NOTE — Telephone Encounter (Signed)
Left voicemail message to call back for review of results.  

## 2020-07-25 NOTE — Telephone Encounter (Signed)
Patient made aware of Dr. Tyrell Antonio response and recommendation. He will HOLD spironolactone for 1 week. He does have a f/u appt scheduled for 08/06/20 Adv the patient that if his symptoms to no improve he should resume the medication as prescribed.  Patient verbalized understanding and voiced appreciation for the call.

## 2020-08-06 ENCOUNTER — Ambulatory Visit: Payer: Medicare (Managed Care) | Admitting: Cardiovascular Disease

## 2020-08-08 ENCOUNTER — Other Ambulatory Visit
Admission: RE | Admit: 2020-08-08 | Discharge: 2020-08-08 | Disposition: A | Discharge status: Home / Self Care | Payer: Medicare (Managed Care) | Source: Ambulatory Visit | Attending: Cardiovascular Disease | Admitting: Cardiovascular Disease

## 2020-08-08 ENCOUNTER — Ambulatory Visit: Payer: Medicare (Managed Care) | Admitting: Cardiovascular Disease

## 2020-08-08 ENCOUNTER — Encounter: Payer: Self-pay | Admitting: Cardiovascular Disease

## 2020-08-08 ENCOUNTER — Other Ambulatory Visit: Payer: Self-pay

## 2020-08-08 VITALS — BP 78/44 | HR 72 | Ht 66.0 in | Wt 168.8 lb

## 2020-08-08 DIAGNOSIS — E785 Hyperlipidemia, unspecified: Secondary | ICD-10-CM

## 2020-08-08 DIAGNOSIS — I350 Nonrheumatic aortic (valve) stenosis: Secondary | ICD-10-CM

## 2020-08-08 DIAGNOSIS — I10 Essential (primary) hypertension: Secondary | ICD-10-CM | POA: Insufficient documentation

## 2020-08-08 DIAGNOSIS — I2510 Atherosclerotic heart disease of native coronary artery without angina pectoris: Secondary | ICD-10-CM

## 2020-08-08 DIAGNOSIS — I5022 Chronic systolic (congestive) heart failure: Secondary | ICD-10-CM | POA: Diagnosis present

## 2020-08-08 DIAGNOSIS — I340 Nonrheumatic mitral (valve) insufficiency: Secondary | ICD-10-CM

## 2020-08-08 LAB — CBC WITH DIFFERENTIAL/PLATELET
Abs Immature Granulocytes: 0.03 10*3/uL (ref 0.00–0.07)
Basophils Absolute: 0 10*3/uL (ref 0.0–0.1)
Basophils Relative: 0 %
Eosinophils Absolute: 0.1 10*3/uL (ref 0.0–0.5)
Eosinophils Relative: 1 %
HCT: 33.9 % — ABNORMAL LOW (ref 39.0–52.0)
Hemoglobin: 11.3 g/dL — ABNORMAL LOW (ref 13.0–17.0)
Immature Granulocytes: 0 %
Lymphocytes Relative: 15 %
Lymphs Abs: 1.3 10*3/uL (ref 0.7–4.0)
MCH: 30.5 pg (ref 26.0–34.0)
MCHC: 33.3 g/dL (ref 30.0–36.0)
MCV: 91.6 fL (ref 80.0–100.0)
Monocytes Absolute: 0.8 10*3/uL (ref 0.1–1.0)
Monocytes Relative: 9 %
Neutro Abs: 6.1 10*3/uL (ref 1.7–7.7)
Neutrophils Relative %: 75 %
Platelets: 258 10*3/uL (ref 150–400)
RBC: 3.7 MIL/uL — ABNORMAL LOW (ref 4.22–5.81)
RDW: 12.5 % (ref 11.5–15.5)
WBC: 8.3 10*3/uL (ref 4.0–10.5)
nRBC: 0 % (ref 0.0–0.2)

## 2020-08-08 LAB — BASIC METABOLIC PANEL
Anion gap: 10 (ref 5–15)
BUN: 13 mg/dL (ref 8–23)
CO2: 30 mmol/L (ref 22–32)
Calcium: 8.6 mg/dL — ABNORMAL LOW (ref 8.9–10.3)
Chloride: 93 mmol/L — ABNORMAL LOW (ref 98–111)
Creatinine, Ser: 1 mg/dL (ref 0.61–1.24)
GFR, Estimated: >60 mL/min (ref >60)
Glucose, Bld: 88 mg/dL (ref 70–99)
Potassium: 3.6 mmol/L (ref 3.5–5.1)
Sodium: 133 mmol/L — ABNORMAL LOW (ref 135–145)

## 2020-08-08 MED ORDER — FUROSEMIDE 20 MG PO TABS: 20.0000 mg | ORAL_TABLET | Freq: Every day | ORAL | 2 refills | Status: DC

## 2020-08-08 NOTE — Progress Notes (Signed)
Cardiology Office Note   Date:  08/08/2020   ID:  Curtis, Campos 09/05/1940, MRN [id number]  PCP:  [name], NP  Cardiologist:   Kathlyn Sacramento, MD   Chief Complaint  Patient presents with  . Other    4 month f/u pt d/c spironolactone making him not feel to well. Meds reviewed verbally with pt.      History of Present Illness: Curtis Campos is a 79 y.o. male who presents for a follow-up visit regarding chronic systolic heart failure, coronary artery disease, aortic stenosis and mitral regurgitation.  He has known history of hyperlipidemia with intolerance to statins, hypertension, previous rectal cancer, previous tobacco use and COPD.  Echocardiogram in 2015 showed an EF of 25% with moderate mitral regurgitation A right and left cardiac catheterization showed severe one-vessel coronary artery disease with a subtotal occlusion of the mid left circumflex which appeared to be chronic.  Most recent echocardiogram in June 2019 showed an EF of 45 to 50%, moderate aortic stenosis with a valve area of 1.25 cm and moderate mitral regurgitation.  He had worsening heart failure in 2021.  Echocardiogram in April 21showed an EF of 30 to 35%, moderate aortic stenosis, mild to moderate mitral regurgitation and moderate pulmonary hypertension.  Lexiscan Myoview showed evidence of ischemia in the left circumflex distribution.  The dose of furosemide was increased to 40 mg daily, losartan was switched to Entresto and small dose spironolactone was added. He had improvement in heart failure symptoms and repeat echocardiogram done last month showed an EF of 55 to 60% with moderate aortic stenosis.  Calculated aortic valve area was 1.25 cm.  He had episodes of dizziness with hypotension that he informed us about recently.  I asked him to discontinue spironolactone and he felt better after that.  He developed recent symptoms of upper respiratory tract infection and his appetite has  decreased.  He started having recurrent symptoms of dizziness and his blood pressure is low today.  Past Medical History:  Diagnosis Date  . Agent orange exposure   . Arthritis 03/12/2011  . Chronic obstructive lung disease ([place])   . Coronary artery disease 03/2013   a. 03/2013 Cath: LM nl, LAD 61m, D1 50, D2 30, LCX 57m/d (chronic), OM1 50, RCA 40p, 12m, RPL 40, RPDA 60, EF 25%, mod-sev MR-->Med mgmt; b. 08/2019 MV: basal inflat/mid inflat defect consistent w/ ischemia in LCX distribution. EF 37%. Intermediate risk.  Marland Kitchen HFrEF (heart failure with reduced ejection fraction) ([place])    a. 03/2013 TEE: EF 25%; b. 08/2017 Echo: EF 45-50%, Gr1 DD. Mod AS. Mod MR. Mildly dil LA; c. 06/2019 Echo: EF 30-35%, glob HK, gr1 DD, RVSP 49.16mmHg. Mildly dil LA. Mod AS (VTI 1.16cm^2, mean grad 80mmHg).  . Hyperlipidemia   . Hypertension   . Ischemic cardiomyopathy    a. 03/2013 TEE: EF 25%; b. 08/2017 Echo: EF 45-50%; c. 06/2019 Echo: EF 30-35%.  . Moderate aortic stenosis    a. 08/2017 Echo: Mod AS. Mean grad (S) 70mmHg. Valve area (VTI): 1.25cm^2; b. 06/2019 Echo: Mod AS. Mean grad 12mmHg, Valve area (VTI): 1.16cm^2.  . Moderate mitral regurgitation    a. 08/2017 Echo: mod MR; b. 06/2019 Echo: mild to mod MR.  . Rectal cancer [facility])     Past Surgical History:  Procedure Laterality Date  . APPENDECTOMY  06/2003  . CARDIAC CATHETERIZATION  03/2013   [facility]  . cataract surgery    . COLOSTOMY    .  HERNIA REPAIR    . resection of rectum       Current Outpatient Medications  Medication Sig Dispense Refill  . albuterol (VENTOLIN HFA) 108 (90 Base) MCG/ACT inhaler INHALE 2 PUFFS BY MOUTH EVERY 6 HOURS AS NEEDED FOR SHORTNESS OF BREATH 18 g 0  . Alirocumab (PRALUENT) 75 MG/ML SOAJ Inject 1 pen into the skin every 14 (fourteen) days. 2 pen 11  . aspirin 81 MG tablet Take 81 mg by mouth daily. Takes at night    . carvedilol (COREG) 12.5 MG tablet TAKE 1 TABLET BY MOUTH TWICE DAILY 180 tablet 5  . cholecalciferol  (VITAMIN D3) 25 MCG (1000 UNIT) tablet Take 1,000 Units by mouth daily.    . diclofenac Sodium (VOLTAREN) 1 % GEL APPLY 4 GRAMS TO AFFECTED AREA FOUR TIMES A DAY AS NEEDED FOR HIP PAIN-TOTAL DAILY DOSE SHOULD NOT EXCEED 32G PER DAY OVER ALL AFFECTEDJOINTS.FOR PAIN.    Marland Kitchen ezetimibe (ZETIA) 10 MG tablet Take 1 tablet by mouth once daily for cholesterol. NEED APPOINTMENT FOR ANY MORE REFILLS 90 tablet 1  . furosemide (LASIX) 40 MG tablet TAKE 1 TABLET BY MOUTH ONCE DAILY. 90 tablet 0  . Magnesium Oxide 400 (240 Mg) MG TABS TAKE 1 TABLET BY MOUTH ONCE DAILY. 90 tablet 3  . metoprolol (TOPROL-XL) 200 MG 24 hr tablet Take 0.5 tablets by mouth daily.    . rosuvastatin (CRESTOR) 40 MG tablet TAKE ONE TABLET BY MOUTH ONCE DAILY FOR CHOLESTEROL    . sacubitril-valsartan (ENTRESTO) 49-51 MG Take 1 tablet by mouth 2 (two) times daily. 60 tablet 6  . spironolactone (ALDACTONE) 25 MG tablet TAKE (1/2) TABLET BY MOUTH ONCE DAILY. 15 tablet 1  . traZODone (DESYREL) 50 MG tablet Take 1 tablet (50 mg total) by mouth at bedtime as needed. for sleep 90 tablet 0   No current facility-administered medications for this visit.    Allergies:   Codeine and Statins    Social History:  The patient  reports that he quit smoking about 7 years ago. His smoking use included cigarettes. He has a 50.00 pack-year smoking history. He has never used smokeless tobacco. He reports that he does not drink alcohol and does not use drugs.   Family History:  The patient's family history includes Cancer - Cervical in his sister; Heart attack (age of onset: 74) in his brother; Heart disease in his father, mother, sister, and sister; Hyperlipidemia in his father; Hypertension in his father.    ROS:  Please see the history of present illness.   Otherwise, review of systems are positive for none.   All other systems are reviewed and negative.    PHYSICAL EXAM: VS:  BP (!) 78/44 (BP Location: Left Arm, Patient Position: Sitting, Cuff Size:  Normal)   Pulse 72   Ht 5\' 6"  (1.676 m)   Wt 168 lb 12 oz (76.5 kg)   SpO2 93%   BMI 27.24 kg/m  , BMI Body mass index is 27.24 kg/m. GEN: Well nourished, well developed, in no acute distress  HEENT: normal  Neck: no JVD, carotid bruits, or masses Cardiac: RRR with premature beats; rubs, or gallops,no edema . 2/6 mid peaking crescendo decrescendo murmur in the aortic area. 2/6 holosystolic murmur at the apex.  There is trace bilateral leg edema Respiratory:  clear to auscultation bilaterally, normal work of breathing GI: soft, nontender, nondistended, + BS MS: no deformity or atrophy  Skin: warm and dry, no rash Neuro:  Strength and sensation  are intact Psych: euthymic mood, full affect   EKG:  EKG is  ordered today. EKG showed normal sinus rhythm with nonspecific T wave changes.   Recent Labs: 02/12/2020: ALT 10; Hemoglobin 13.6; Platelets 289.0 04/04/2020: BUN 19; Creatinine, Ser 1.03; Potassium 4.6; Sodium 135    Lipid Panel    Component Value Date/Time   CHOL 185 02/12/2020 1454   TRIG 128.0 02/12/2020 1454   HDL 53.50 02/12/2020 1454   CHOLHDL 3 02/12/2020 1454   VLDL 25.6 02/12/2020 1454   LDLCALC 106 (H) 02/12/2020 1454   LDLDIRECT 129.7 04/14/2012 0756      Wt Readings from Last 3 Encounters:  08/08/20 168 lb 12 oz (76.5 kg)  07/03/20 171 lb (77.6 kg)  05/13/20 173 lb 4 oz (78.6 kg)         ASSESSMENT AND PLAN:  1. Chronic systolic heart failure: Most recent echocardiogram from last month showed improvement in ejection fraction to normal.  I am concerned about his hypotension which might be related to volume depletion in the setting of what seems to be an upper respiratory tract infection.  Additionally, he might be overmedicated.  I am going to send him for stat labs including CBC and basic metabolic profile.  No plans to resume spironolactone.  Hold Entresto for now.  Continue carvedilol.  I asked the patient and wife to monitor blood pressure over  the next few days and call us with the readings on Monday and then will decide on the dosage of Entresto to resume.   2. Coronary artery disease: Currently with no anginal symptoms.  Nuclear stress test last year showed evidence of ischemia in the left circumflex distribution which is expected given known chronic subtotal occlusion of the mid left circumflex.  3. Essential hypertension: Blood pressure is significantly low and I made adjustment in medications as outlined above.  4. Hyperlipidemia: He is intolerant to statins .  He is currently on Praluent and Zetia.  Most recent lipid profile showed an LDL of 85.  5. Moderate aortic stenosis and moderate mitral regurgitation.    Stable on most recent echocardiogram in April.   Disposition:   FU with me in 2 months  Signed,  Kathlyn Sacramento, MD  08/08/2020 11:44 AM    [place]

## 2020-08-08 NOTE — Patient Instructions (Signed)
Medication Instructions:  Your physician has recommended you make the following change in your medication:   1) STOP Metoprolol (Do not take)  2) STOP Spironolactone  3) DECREASE Lasix to 20 mg daily  4) HOLD Entresto until further instructed  *If you need a refill on your cardiac medications before your next appointment, please call your pharmacy*   Lab Work: Bmp and Cbc today. Please have your labs drawn at the medical mall. Stop at the registration desk to check in.  If you have labs (blood work) drawn today and your tests are completely normal, you will receive your results only by: [facility] [facility] MyChart Message (if you have MyChart) OR . A paper copy in the mail If you have any lab test that is abnormal or we need to change your treatment, we will call you to review the results.   Testing/Procedures: None ordered   Follow-Up: At [facility], you and your health needs are our priority.  As part of our continuing mission to provide you with exceptional heart care, we have created designated Provider Care Teams.  These Care Teams include your primary Cardiologist (physician) and Advanced Practice Providers (APPs -  Physician Assistants and Nurse Practitioners) who all work together to provide you with the care you need, when you need it.  We recommend signing up for the patient portal called "MyChart".  Sign up information is provided on this After Visit Summary.  MyChart is used to connect with patients for Virtual Visits (Telemedicine).  Patients are able to view lab/test results, encounter notes, upcoming appointments, etc.  Non-urgent messages can be sent to your provider as well.   To learn more about what you can do with MyChart, go to [email or web address].    Your next appointment:   2 month(s)  The format for your next appointment:   In Person  Provider:   You may see Kathlyn Sacramento, MD or one of the following Advanced Practice Providers on your designated Care Team:     Murray Hodgkins, NP  Christell Faith, PA-C  Marrianne Mood, PA-C  Cadence Kathlen Mody, [place]  Laurann Montana, NP    Other Instructions Your physician has requested that you monitor and record your blood pressure readings at home daily. Please use the same machine at the same time of day to check your readings and record them. Please call the office on Monday 08/12/20 to report your readings.

## 2020-08-15 ENCOUNTER — Telehealth: Payer: Self-pay | Admitting: Cardiovascular Disease

## 2020-08-15 NOTE — Telephone Encounter (Signed)
Patient called to update Dr. Fletcher Anon with BP readings after being instructed to HOLD entresto due to hypotension.

## 2020-08-15 NOTE — Telephone Encounter (Signed)
Pt c/o BP issue: STAT if pt c/o blurred vision, one-sided weakness or slurred speech  1. What are your last 5 BP readings?  Monday 134/75 , Tuesday 139/78,  this morning 147/87  2. Are you having any other symptoms (ex. Dizziness, headache, blurred vision, passed out)? No other symptoms.  3. What is your BP issue? elevated States Dr. Fletcher Anon changed his Blood pressure medication recently.

## 2020-08-15 NOTE — Telephone Encounter (Signed)
Resume Entresto at a lower dose of 24/26 milligrams twice daily.

## 2020-08-16 MED ORDER — ENTRESTO 24-26 MG PO TABS: 1.0000 | ORAL_TABLET | Freq: Two times a day (BID) | ORAL | 1 refills | Status: DC

## 2020-08-16 NOTE — Telephone Encounter (Signed)
Spoke with the patient. Patient made aware of Dr. Tyrell Antonio recommendation to resume Entesto at the lower dose of 24/26 mg bid.  Rx sent to the patient pharmacy. Adv the patient to contact the office if his BP is running consistently high or low. Patient verbalized understanding and voiced appreciation for the call.

## 2020-08-16 NOTE — Telephone Encounter (Signed)
Called to give the patient Dr. Tyrell Antonio response and recommendation. [email or web address].

## 2020-08-16 NOTE — Telephone Encounter (Signed)
Patient returning call for med rec.  If no answer ok to leave detailed msg on voicemail per patient .

## 2020-09-12 ENCOUNTER — Other Ambulatory Visit: Payer: Self-pay | Admitting: Cardiovascular Disease

## 2020-10-11 ENCOUNTER — Other Ambulatory Visit: Payer: Self-pay

## 2020-10-11 ENCOUNTER — Ambulatory Visit: Payer: Medicare (Managed Care) | Admitting: Nurse Practitioner

## 2020-10-11 ENCOUNTER — Encounter: Payer: Self-pay | Admitting: Nurse Practitioner

## 2020-10-11 VITALS — BP 92/60 | HR 64 | Ht 66.0 in | Wt 171.0 lb

## 2020-10-11 DIAGNOSIS — I10 Essential (primary) hypertension: Secondary | ICD-10-CM | POA: Diagnosis not present

## 2020-10-11 DIAGNOSIS — I959 Hypotension, unspecified: Secondary | ICD-10-CM

## 2020-10-11 DIAGNOSIS — I5022 Chronic systolic (congestive) heart failure: Secondary | ICD-10-CM

## 2020-10-11 DIAGNOSIS — I2510 Atherosclerotic heart disease of native coronary artery without angina pectoris: Secondary | ICD-10-CM | POA: Diagnosis not present

## 2020-10-11 DIAGNOSIS — I340 Nonrheumatic mitral (valve) insufficiency: Secondary | ICD-10-CM

## 2020-10-11 DIAGNOSIS — I350 Nonrheumatic aortic (valve) stenosis: Secondary | ICD-10-CM

## 2020-10-11 MED ORDER — ENTRESTO 24-26 MG PO TABS: 0.5000 | ORAL_TABLET | Freq: Two times a day (BID) | ORAL | 1 refills | Status: AC

## 2020-10-11 NOTE — Patient Instructions (Signed)
Medication Instructions:  No changes at this time.   *If you need a refill on your cardiac medications before your next appointment, please call your pharmacy*   Lab Work: None  If you have labs (blood work) drawn today and your tests are completely normal, you will receive your results only by: [place] (if you have MyChart) OR A paper copy in the mail If you have any lab test that is abnormal or we need to change your treatment, we will call you to review the results.   Testing/Procedures: None   Follow-Up: At [facility], you and your health needs are our priority.  As part of our continuing mission to provide you with exceptional heart care, we have created designated Provider Care Teams.  These Care Teams include your primary Cardiologist (physician) and Advanced Practice Providers (APPs -  Physician Assistants and Nurse Practitioners) who all work together to provide you with the care you need, when you need it.   Your next appointment:   4 month(s)  The format for your next appointment:   In Person  Provider:   Kathlyn Sacramento, MD or Murray Hodgkins, NP

## 2020-10-11 NOTE — Progress Notes (Signed)
Office Visit    Patient Name: Curtis Campos Date of Encounter: 10/11/2020  Primary Care Provider:  [name], NP Primary Cardiologist:  Kathlyn Sacramento, MD  Chief Complaint    80 year old male with a history of CAD, ischemic cardiomyopathy, HFrEF, moderate aortic stenosis, moderate mitral regurgitation, hypertension, hyperlipidemia with statin intolerance, remote tobacco abuse, and COPD, who presents for follow-up of heart failure.  Past Medical History    Past Medical History:  Diagnosis Date   Agent orange exposure    Arthritis 03/12/2011   Chronic obstructive lung disease ([place])    Coronary artery disease 03/2013   a. 03/2013 Cath: LM nl, LAD 76m, D1 50, D2 30, LCX 66m/d (chronic), OM1 50, RCA 40p, 35m, RPL 40, RPDA 60, EF 25%, mod-sev MR-->Med mgmt; b. 08/2019 MV: basal inflat/mid inflat defect consistent w/ ischemia in LCX distribution. EF 37%. Intermediate risk.   HFrEF (heart failure with reduced ejection fraction) ([place])    a. 03/2013 TEE: EF 25%; b. 08/2017 Echo: EF 45-50%, Gr1 DD. Mod AS. Mod MR. Mildly dil LA; c. 06/2019 Echo: EF 30-35%, glob HK, gr1 DD; d. 06/2020 Echo: EF 55-60%, nl RV fxn, triv MR, Mod AS.   Hyperlipidemia    Hypertension    Ischemic cardiomyopathy    a. 03/2013 TEE: EF 25%; b. 08/2017 Echo: EF 45-50%; c. 06/2019 Echo: EF 30-35%; d. 06/2020 Echo: EF 55-60%.   Moderate aortic stenosis    a. 08/2017 Echo: Mod AS. Mean grad (S) 102mmHg. Valve area (VTI): 1.25cm^2; b. 06/2019 Echo: Mod AS. Mean grad 68mmHg, Valve area (VTI): 1.16cm^2); c. 06/2020 Mod AS. AVA 1.25cm^2, mean grad 55mmHg.   Moderate mitral regurgitation    a. 08/2017 Echo: mod MR; b. 06/2019 Echo: mild to mod MR.   Rectal cancer [facility])    Past Surgical History:  Procedure Laterality Date   APPENDECTOMY  06/2003   CARDIAC CATHETERIZATION  03/2013   [facility]   cataract surgery     COLOSTOMY     HERNIA REPAIR     resection of rectum      Allergies  Allergies  Allergen Reactions   Codeine  Nausea Only   Statins Other (See Comments)    REACTION: maylgias    History of Present Illness    80 year old male with the above complex past medical history including HFrEF, ischemic cardiomyopathy, moderate aortic stenosis, moderate mitral regurgitation, hypertension, hyperlipidemia with statin intolerance, rectal cancer, remote tobacco abuse, and COPD.  In January 2015, he was diagnosed with cardiomyopathy after echocardiogram showed an EF of 25% with moderate mitral regurgitation.  Diagnostic catheterization showed severe one-vessel CAD involving a subtotal occlusion of the left circumflex, which appeared to be chronic.  He otherwise had nonobstructive disease.  There was concern for moderate to severe mitral regurgitation however, his follow-up TEE showed only moderate mitral regurgitation.  He has since been medically managed.  EF was 45 to 50% in June 2019.  Most recent echocardiogram in April 2021 showed an EF of 30-35% with global hypokinesis, grade 1 diastolic dysfunction, moderate aortic stenosis, mild to moderate MR, and RVSP of 49.4 mmHg.  Lexiscan Myoview was obtained in early June showing a medium defect of moderate severity in the basal inferolateral and mid inferolateral location consistent with ischemia in the circumflex distribution.  EF is 37%.  Given known occlusion of the circumflex and absence of chest pain, ongoing medical therapy was recommended.  In early 2022, we were able to get him back on [place]  through the [place]. he required discontinuation of spironolactone therapy in the setting of symptomatic hypotension.  Follow-up echocardiogram in April 2022 showed normalization of LV function to 55-60% with trivial MR and stable, moderate aortic stenosis (valve area 1.25 cm).  He remained hypotensive at most recent clinic visit in May with blood pressure of 78/44.  He was advised to hold his Entresto.  Lab work was notable for normal renal function, though he was mildly anemic at  11.3/33.9.  He has since resumed taking Entresto at half a tab twice a day.  With this, his blood pressures have been in the low 100s to 1 teens at home and he has not had any significant presyncope.  He has chronic, stable dyspnea on exertion and denies chest pain, palpitations, PND, orthopnea, syncope, edema, or early satiety.  He notes that for the month of May, both he and his wife were fairly sick with cold and flulike symptoms.  They both continue to have intermittent congestion and cough, that is occasionally productive.  They never tested for COVID during their period of illness.  Home Medications    Prior to Admission medications   Medication Sig Start Date End Date Taking? Authorizing Provider  albuterol (VENTOLIN HFA) 108 (90 Base) MCG/ACT inhaler INHALE 2 PUFFS BY MOUTH EVERY 6 HOURS AS NEEDED FOR SHORTNESS OF BREATH 08/29/18  Yes [name], NP  Alirocumab (PRALUENT) 75 MG/ML SOAJ Inject 1 pen into the skin every 14 (fourteen) days. 08/25/18  Yes Wellington Hampshire, MD  aspirin 81 MG tablet Take 81 mg by mouth daily. Takes at night   Yes [provider]  carvedilol (COREG) 12.5 MG tablet TAKE 1 TABLET BY MOUTH TWICE DAILY 05/07/20  Yes Wellington Hampshire, MD  cholecalciferol (VITAMIN D3) 25 MCG (1000 UNIT) tablet Take 1,000 Units by mouth daily.   Yes [provider]  diclofenac Sodium (VOLTAREN) 1 % GEL APPLY 4 GRAMS TO AFFECTED AREA FOUR TIMES A DAY AS NEEDED FOR HIP PAIN-TOTAL DAILY DOSE SHOULD NOT EXCEED 32G PER DAY OVER ALL AFFECTEDJOINTS.FOR PAIN. 10/30/19  Yes [provider]  ezetimibe (ZETIA) 10 MG tablet Take 1 tablet by mouth once daily for cholesterol. NEED APPOINTMENT FOR ANY MORE REFILLS 08/29/18  Yes [name], NP  furosemide (LASIX) 20 MG tablet Take 1 tablet (20 mg total) by mouth daily. 08/08/20  Yes Wellington Hampshire, MD  Magnesium Oxide 400 (240 Mg) MG TABS TAKE 1 TABLET BY MOUTH ONCE DAILY. 05/03/17  Yes Wellington Hampshire, MD  traZODone  (DESYREL) 50 MG tablet Take 1 tablet (50 mg total) by mouth at bedtime as needed. for sleep 08/29/18  Yes [name], NP  rosuvastatin (CRESTOR) 40 MG tablet TAKE ONE TABLET BY MOUTH ONCE DAILY FOR CHOLESTEROL Patient not taking: Reported on 10/11/2020 12/29/19   [provider]  sacubitril-valsartan (ENTRESTO) 24-26 MG Take 0.5 tablets by mouth 2 (two) times daily. 10/11/20 01/09/21  Theora Gianotti, NP    Review of Systems    Chronic, stable dyspnea on exertion.  Flulike symptoms in May with some ongoing mildly productive cough.  He denies chest pain, palpitations, PND, orthopnea, dizziness, syncope, edema, or early satiety..  All other systems reviewed and are otherwise negative except as noted above.  Physical Exam    VS:  BP 92/60 (BP Location: Left Arm, Patient Position: Sitting, Cuff Size: Normal)   Pulse 64   Ht 5\' 6"  (1.676 m)   Wt 171 lb (77.6 kg)  SpO2 91%   BMI 27.60 kg/m  , BMI Body mass index is 27.6 kg/m.     GEN: Well nourished, well developed, in no acute distress. HEENT: normal. Neck: Supple, no JVD, carotid bruits, or masses. Cardiac: RRR, 2/6 systolic murmur heard throughout, loudest at the upper sternal borders.  No rubs, or gallops. No clubbing, cyanosis, edema.  Radials/PT 2+ and equal bilaterally.  Respiratory:  Respirations regular and unlabored, clear to auscultation bilaterally. GI: Soft, nontender, nondistended, BS + x 4. MS: no deformity or atrophy. Skin: warm and dry, no rash. Neuro:  Strength and sensation are intact. Psych: Normal affect.  Accessory Clinical Findings     Lab Results  Component Value Date   WBC 8.3 08/08/2020   HGB 11.3 (L) 08/08/2020   HCT 33.9 (L) 08/08/2020   MCV 91.6 08/08/2020   PLT 258 08/08/2020   Lab Results  Component Value Date   CREATININE 1.00 08/08/2020   BUN 13 08/08/2020   NA 133 (L) 08/08/2020   K 3.6 08/08/2020   CL 93 (L) 08/08/2020   CO2 30 08/08/2020   Lab Results  Component  Value Date   ALT 10 02/12/2020   AST 14 02/12/2020   ALKPHOS 64 02/12/2020   BILITOT 0.9 02/12/2020   Lab Results  Component Value Date   CHOL 185 02/12/2020   HDL 53.50 02/12/2020   LDLCALC 106 (H) 02/12/2020   LDLDIRECT 129.7 04/14/2012   TRIG 128.0 02/12/2020   CHOLHDL 3 02/12/2020    Lab Results  Component Value Date   HGBA1C 6.3 02/12/2020    Assessment & Plan    1.  Chronic heart failure with improved ejection fraction: EF previously 30 to 35% with subsequent improvement to 55 to 60% by echo in April of this year.  He is euvolemic on examination and has been doing well at home.  He remains on beta-blocker, and low-dose Entresto (half a tab twice a day).  Blood pressure has been stable at home with resolution of lightheadedness.  He remains on low-dose Lasix.  2.  Coronary artery disease: Chronic, stable dyspnea on exertion without chest pain.  Stress test in 2021 showed evidence of ischemia in the left circumflex distribution which is expected given known chronic total occlusion of the left circumflex.  Continue medical therapy including aspirin, beta-blocker, statin, Zetia, and Repatha.  3.  Essential hypertension/hypotension: Blood pressure stable today at 92/60.  He has been trending in the low 100s at home.  He remains on beta-blocker and low-dose Entresto.  4.  Hyperlipidemia: LDL of 106 in November 2021 despite max dose rosuvastatin, Zetia, and addition of Repatha.  5.  Moderate aortic stenosis and moderate mitral regurgitation: Stable on recent echo in April 2022.  6.  Normocytic anemia: Follow-up with primary care.  7.  Disposition: Follow-up in clinic in 4 months or sooner if necessary.   Murray Hodgkins, NP 10/11/2020, 1:48 PM

## 2020-11-13 ENCOUNTER — Other Ambulatory Visit: Payer: Self-pay | Admitting: Cardiovascular Disease

## 2021-01-01 ENCOUNTER — Telehealth: Payer: Self-pay | Admitting: Nurse Practitioner

## 2021-01-01 NOTE — Telephone Encounter (Signed)
I agree with recommendations and plan for f/u in the office tomorrow.  Nelva Bush, MD [facility]

## 2021-01-01 NOTE — Telephone Encounter (Signed)
Mavis NP with [employer] called from patients home. Reported low BP readings of 80/54, 85/59, 86/52. Patient is asymptomatic. Follow up appointment with Dr. Fletcher Anon is not until 02/11/21, scheduled him with Dr. Saunders Revel (DOD) slot tomorrow at 4:20. Advised patient to hold his Carvedilol 12.5 MG, and his Lasix 20 MG (he only takes at night)  tonight but to take his Entresto 24-26 MG. Also advised that patient take his BP before he takes his medication in the AM and if his SBP is under 100 to hold his Carvedilol.   Mavis and patient expressed their appreciation for the appointment tomorrow.

## 2021-01-01 NOTE — Telephone Encounter (Signed)
Pt c/o BP issue: STAT if pt c/o blurred vision, one-sided weakness or slurred speech  1. What are your last 5 BP readings? 80/54 85/59  86/52   2. Are you having any other symptoms (ex. Dizziness, headache, blurred vision, passed out)? No symptoms   3. What is your BP issue? Pcp doing home visit and states vs are dangerously low please advise

## 2021-01-02 ENCOUNTER — Ambulatory Visit: Payer: Medicare Other | Admitting: Internal Medicine

## 2021-02-11 ENCOUNTER — Other Ambulatory Visit: Payer: Self-pay

## 2021-02-11 ENCOUNTER — Ambulatory Visit: Payer: Medicare Other | Admitting: Cardiovascular Disease

## 2021-02-11 ENCOUNTER — Encounter: Payer: Self-pay | Admitting: Cardiovascular Disease

## 2021-02-11 VITALS — BP 110/70 | HR 63 | Ht 66.0 in | Wt 173.0 lb

## 2021-02-11 DIAGNOSIS — I5022 Chronic systolic (congestive) heart failure: Secondary | ICD-10-CM | POA: Diagnosis not present

## 2021-02-11 DIAGNOSIS — I2510 Atherosclerotic heart disease of native coronary artery without angina pectoris: Secondary | ICD-10-CM

## 2021-02-11 DIAGNOSIS — I10 Essential (primary) hypertension: Secondary | ICD-10-CM | POA: Diagnosis not present

## 2021-02-11 DIAGNOSIS — I350 Nonrheumatic aortic (valve) stenosis: Secondary | ICD-10-CM

## 2021-02-11 DIAGNOSIS — I340 Nonrheumatic mitral (valve) insufficiency: Secondary | ICD-10-CM

## 2021-02-11 NOTE — Patient Instructions (Signed)
Medication Instructions:  Your physician recommends that you continue on your current medications as directed. Please refer to the Current Medication list given to you today.  *If you need a refill on your cardiac medications before your next appointment, please call your pharmacy*   Lab Work: None ordered If you have labs (blood work) drawn today and your tests are completely normal, you will receive your results only by: Onset (if you have MyChart) OR A paper copy in the mail If you have any lab test that is abnormal or we need to change your treatment, we will call you to review the results.   Testing/Procedures: Your physician has requested that you have an echocardiogram. Echocardiography is a painless test that uses sound waves to create images of your heart. It provides your doctor with information about the size and shape of your heart and how well your heart's chambers and valves are working. This procedure takes approximately one hour. There are no restrictions for this procedure.  (To be scheduled in 3 months)   Follow-Up: At [facility], you and your health needs are our priority.  As part of our continuing mission to provide you with exceptional heart care, we have created designated Provider Care Teams.  These Care Teams include your primary Cardiologist (physician) and Advanced Practice Providers (APPs -  Physician Assistants and Nurse Practitioners) who all work together to provide you with the care you need, when you need it.  We recommend signing up for the patient portal called "MyChart".  Sign up information is provided on this After Visit Summary.  MyChart is used to connect with patients for Virtual Visits (Telemedicine).  Patients are able to view lab/test results, encounter notes, upcoming appointments, etc.  Non-urgent messages can be sent to your provider as well.   To learn more about what you can do with MyChart, go to [email or web address].    Your  next appointment:   To be scheduled after the echo  The format for your next appointment:   In Person  Provider:   You may see Kathlyn Sacramento, MD or one of the following Advanced Practice Providers on your designated Care Team:   Murray Hodgkins, NP Christell Faith, PA-C Cadence Kathlen Mody, [place]    Other Instructions  Please bring all of your medications with you to your next office appointment.

## 2021-02-11 NOTE — Progress Notes (Signed)
Cardiology Office Note   Date:  02/11/2021   ID:  Curtis Campos, Curtis Campos Aug 17, 1940, MRN [id number]  PCP:  [name], NP  Cardiologist:   Kathlyn Sacramento, MD   Chief Complaint  Patient presents with   Other    4 Month f/u c/o sob worse with exertion. Pt requesting most recent lab work to be faxed to [facility] reviewed verbally with pt.       History of Present Illness: Curtis Campos is a 80 y.o. male who presents for a follow-up visit regarding chronic systolic heart failure, coronary artery disease, aortic stenosis and mitral regurgitation.  He has known history of hyperlipidemia with intolerance to statins, hypertension, previous rectal cancer, previous tobacco use and COPD.  Echocardiogram in 2015 showed an EF of 25% with moderate mitral regurgitation A right and left cardiac catheterization showed severe one-vessel coronary artery disease with a subtotal occlusion of the mid left circumflex which appeared to be chronic.  Most recent echocardiogram in June 2019 showed an EF of 45 to 50%, moderate aortic stenosis with a valve area of 1.25 cm and moderate mitral regurgitation.   He had worsening heart failure in 2021.  Echocardiogram in April 21showed an EF of 30 to 35%, moderate aortic stenosis, mild to moderate mitral regurgitation and moderate pulmonary hypertension.  Lexiscan Myoview showed evidence of ischemia in the left circumflex distribution.  The dose of furosemide was increased to 40 mg daily, losartan was switched to Entresto and small dose spironolactone was added. He had improvement in heart failure symptoms and repeat echocardiogram done in April of this year showed an EF of 55 to 60% with moderate aortic stenosis.  Calculated aortic valve area was 1.25 cm.  During his office visit in May he was found to be significantly hypotensive even after spironolactone was discontinued.  His labs showed normal renal function but he was noted to be  mildly anemic.  Delene Loll was held for few days but then resumed at half dose.  He has been doing reasonably well but does report worsening exertional dyspnea without chest pain.  He takes half a dose of Entresto 49/51 mg twice daily.   Past Medical History:  Diagnosis Date   Agent orange exposure    Arthritis 03/12/2011   Chronic obstructive lung disease ([place])    Coronary artery disease 03/2013   a. 03/2013 Cath: LM nl, LAD 59m, D1 50, D2 30, LCX 25m/d (chronic), OM1 50, RCA 40p, 40m, RPL 40, RPDA 60, EF 25%, mod-sev MR-->Med mgmt; b. 08/2019 MV: basal inflat/mid inflat defect consistent w/ ischemia in LCX distribution. EF 37%. Intermediate risk.   HFrEF (heart failure with reduced ejection fraction) ([place])    a. 03/2013 TEE: EF 25%; b. 08/2017 Echo: EF 45-50%, Gr1 DD. Mod AS. Mod MR. Mildly dil LA; c. 06/2019 Echo: EF 30-35%, glob HK, gr1 DD; d. 06/2020 Echo: EF 55-60%, nl RV fxn, triv MR, Mod AS.   Hyperlipidemia    Hypertension    Ischemic cardiomyopathy    a. 03/2013 TEE: EF 25%; b. 08/2017 Echo: EF 45-50%; c. 06/2019 Echo: EF 30-35%; d. 06/2020 Echo: EF 55-60%.   Moderate aortic stenosis    a. 08/2017 Echo: Mod AS. Mean grad (S) 66mmHg. Valve area (VTI): 1.25cm^2; b. 06/2019 Echo: Mod AS. Mean grad 51mmHg, Valve area (VTI): 1.16cm^2); c. 06/2020 Mod AS. AVA 1.25cm^2, mean grad 72mmHg.   Moderate mitral regurgitation    a. 08/2017 Echo: mod MR; b. 06/2019 Echo:  mild to mod MR.   Rectal cancer [facility])     Past Surgical History:  Procedure Laterality Date   APPENDECTOMY  06/2003   CARDIAC CATHETERIZATION  03/2013   [facility]   cataract surgery     COLOSTOMY     HERNIA REPAIR     resection of rectum       Current Outpatient Medications  Medication Sig Dispense Refill   albuterol (VENTOLIN HFA) 108 (90 Base) MCG/ACT inhaler INHALE 2 PUFFS BY MOUTH EVERY 6 HOURS AS NEEDED FOR SHORTNESS OF BREATH 18 g 0   Alirocumab (PRALUENT) 75 MG/ML SOAJ Inject 1 pen into the skin every 14 (fourteen) days. 2 pen 11    aspirin 81 MG tablet Take 81 mg by mouth daily. Takes at night     carvedilol (COREG) 12.5 MG tablet TAKE 1 TABLET BY MOUTH TWICE DAILY 180 tablet 5   cholecalciferol (VITAMIN D3) 25 MCG (1000 UNIT) tablet Take 1,000 Units by mouth daily.     diclofenac Sodium (VOLTAREN) 1 % GEL APPLY 4 GRAMS TO AFFECTED AREA FOUR TIMES A DAY AS NEEDED FOR HIP PAIN-TOTAL DAILY DOSE SHOULD NOT EXCEED 32G PER DAY OVER ALL AFFECTEDJOINTS.FOR PAIN.     ezetimibe (ZETIA) 10 MG tablet Take 1 tablet by mouth once daily for cholesterol. NEED APPOINTMENT FOR ANY MORE REFILLS 90 tablet 1   furosemide (LASIX) 20 MG tablet TAKE 1 TABLET BY MOUTH ONCE DAILY. 30 tablet 2   Magnesium Oxide 400 (240 Mg) MG TABS TAKE 1 TABLET BY MOUTH ONCE DAILY. 90 tablet 3   rosuvastatin (CRESTOR) 40 MG tablet      sacubitril-valsartan (ENTRESTO) 24-26 MG Take 1 tablet by mouth 2 (two) times daily.     traZODone (DESYREL) 50 MG tablet Take 1 tablet (50 mg total) by mouth at bedtime as needed. for sleep 90 tablet 0   No current facility-administered medications for this visit.    Allergies:   Codeine and Statins    Social History:  The patient  reports that he quit smoking about 7 years ago. His smoking use included cigarettes. He has a 50.00 pack-year smoking history. He has never used smokeless tobacco. He reports that he does not drink alcohol and does not use drugs.   Family History:  The patient's family history includes Cancer - Cervical in his sister; Heart attack (age of onset: 4) in his brother; Heart disease in his father, mother, sister, and sister; Hyperlipidemia in his father; Hypertension in his father.    ROS:  Please see the history of present illness.   Otherwise, review of systems are positive for none.   All other systems are reviewed and negative.    PHYSICAL EXAM: VS:  BP 110/70 (BP Location: Left Arm, Patient Position: Sitting, Cuff Size: Normal)   Pulse 63   Ht 5\' 6"  (1.676 m)   Wt 173 lb (78.5 kg)   SpO2 92%    BMI 27.92 kg/m  , BMI Body mass index is 27.92 kg/m. GEN: Well nourished, well developed, in no acute distress  HEENT: normal  Neck: no JVD, carotid bruits, or masses Cardiac: RRR with premature beats; rubs, or gallops,no edema . 3/6 late peaking crescendo decrescendo murmur in the aortic area. 2/6 holosystolic murmur at the apex.  There is trace bilateral leg edema Respiratory:  clear to auscultation bilaterally, normal work of breathing GI: soft, nontender, nondistended, + BS MS: no deformity or atrophy  Skin: warm and dry, no rash Neuro:  Strength and  sensation are intact Psych: euthymic mood, full affect   EKG:  EKG is  ordered today. EKG showed sinus rhythm with sinus arrhythmia and left axis deviation.   Recent Labs: 08/08/2020: BUN 13; Creatinine, Ser 1.00; Hemoglobin 11.3; Platelets 258; Potassium 3.6; Sodium 133    Lipid Panel    Component Value Date/Time   CHOL 185 02/12/2020 1454   TRIG 128.0 02/12/2020 1454   HDL 53.50 02/12/2020 1454   CHOLHDL 3 02/12/2020 1454   VLDL 25.6 02/12/2020 1454   LDLCALC 106 (H) 02/12/2020 1454   LDLDIRECT 129.7 04/14/2012 0756      Wt Readings from Last 3 Encounters:  02/11/21 173 lb (78.5 kg)  10/11/20 171 lb (77.6 kg)  08/08/20 168 lb 12 oz (76.5 kg)         ASSESSMENT AND PLAN:  1. Chronic systolic heart failure: Most recent echocardiogram  showed improvement in ejection fraction to normal.  His hypotension stabilized after decreasing the dose of Entresto and stopping spironolactone.  He appears to be euvolemic on small dose of furosemide.   2. Coronary artery disease: Currently with no anginal symptoms.  Nuclear stress test last year showed evidence of ischemia in the left circumflex distribution which is expected given known chronic subtotal occlusion of the mid left circumflex.   3. Essential hypertension: Blood pressure is well controlled.   4. Hyperlipidemia: He has history of intolerance to statins and  currently is on Praluent and Zetia.  In addition, this medication list shows that he takes rosuvastatin 40 mg daily.  The patient is not sure if he actually takes rosuvastatin.  I asked him to bring his medication with him to his next appointment.    5. Moderate aortic stenosis and moderate mitral regurgitation.   Given worsening exertional dyspnea we will repeat his echocardiogram in 3 months for now instead of 6 months. We will also plan on doing routine labs on him during his next visit in 3 months if not already done with his primary care physician.   Disposition:   Echocardiogram and follow-up in 3 months. Signed,  Kathlyn Sacramento, MD  02/11/2021 2:58 PM    [place]

## 2021-02-13 ENCOUNTER — Other Ambulatory Visit: Payer: Self-pay | Admitting: Cardiovascular Disease

## 2021-03-10 ENCOUNTER — Emergency Department: Payer: Non-veteran care

## 2021-03-10 ENCOUNTER — Encounter: Payer: Self-pay | Admitting: Radiology

## 2021-03-10 ENCOUNTER — Other Ambulatory Visit: Payer: Self-pay

## 2021-03-10 ENCOUNTER — Inpatient Hospital Stay
Admission: EM | Admit: 2021-03-10 | Discharge: 2021-03-15 | DRG: 190 | Disposition: A | Discharge status: Home Health | Payer: Non-veteran care | Attending: Internal Medicine | Admitting: Internal Medicine

## 2021-03-10 DIAGNOSIS — I5030 Unspecified diastolic (congestive) heart failure: Secondary | ICD-10-CM

## 2021-03-10 DIAGNOSIS — I110 Hypertensive heart disease with heart failure: Secondary | ICD-10-CM | POA: Diagnosis present

## 2021-03-10 DIAGNOSIS — R0902 Hypoxemia: Secondary | ICD-10-CM

## 2021-03-10 DIAGNOSIS — Z87891 Personal history of nicotine dependence: Secondary | ICD-10-CM | POA: Diagnosis not present

## 2021-03-10 DIAGNOSIS — R7303 Prediabetes: Secondary | ICD-10-CM | POA: Diagnosis present

## 2021-03-10 DIAGNOSIS — Z933 Colostomy status: Secondary | ICD-10-CM

## 2021-03-10 DIAGNOSIS — Z885 Allergy status to narcotic agent status: Secondary | ICD-10-CM | POA: Diagnosis not present

## 2021-03-10 DIAGNOSIS — J9601 Acute respiratory failure with hypoxia: Secondary | ICD-10-CM | POA: Diagnosis present

## 2021-03-10 DIAGNOSIS — I5043 Acute on chronic combined systolic (congestive) and diastolic (congestive) heart failure: Secondary | ICD-10-CM | POA: Diagnosis present

## 2021-03-10 DIAGNOSIS — Z20822 Contact with and (suspected) exposure to covid-19: Secondary | ICD-10-CM | POA: Diagnosis present

## 2021-03-10 DIAGNOSIS — I5022 Chronic systolic (congestive) heart failure: Secondary | ICD-10-CM | POA: Diagnosis present

## 2021-03-10 DIAGNOSIS — Z85048 Personal history of other malignant neoplasm of rectum, rectosigmoid junction, and anus: Secondary | ICD-10-CM

## 2021-03-10 DIAGNOSIS — I255 Ischemic cardiomyopathy: Secondary | ICD-10-CM | POA: Diagnosis present

## 2021-03-10 DIAGNOSIS — I5031 Acute diastolic (congestive) heart failure: Secondary | ICD-10-CM | POA: Diagnosis not present

## 2021-03-10 DIAGNOSIS — I951 Orthostatic hypotension: Secondary | ICD-10-CM | POA: Diagnosis present

## 2021-03-10 DIAGNOSIS — Z888 Allergy status to other drugs, medicaments and biological substances status: Secondary | ICD-10-CM | POA: Diagnosis not present

## 2021-03-10 DIAGNOSIS — I5023 Acute on chronic systolic (congestive) heart failure: Secondary | ICD-10-CM | POA: Diagnosis not present

## 2021-03-10 DIAGNOSIS — I10 Essential (primary) hypertension: Secondary | ICD-10-CM | POA: Diagnosis not present

## 2021-03-10 DIAGNOSIS — Z7982 Long term (current) use of aspirin: Secondary | ICD-10-CM | POA: Diagnosis not present

## 2021-03-10 DIAGNOSIS — J9621 Acute and chronic respiratory failure with hypoxia: Secondary | ICD-10-CM | POA: Diagnosis present

## 2021-03-10 DIAGNOSIS — R918 Other nonspecific abnormal finding of lung field: Secondary | ICD-10-CM

## 2021-03-10 DIAGNOSIS — Z8249 Family history of ischemic heart disease and other diseases of the circulatory system: Secondary | ICD-10-CM | POA: Diagnosis not present

## 2021-03-10 DIAGNOSIS — I080 Rheumatic disorders of both mitral and aortic valves: Secondary | ICD-10-CM | POA: Diagnosis present

## 2021-03-10 DIAGNOSIS — I2510 Atherosclerotic heart disease of native coronary artery without angina pectoris: Secondary | ICD-10-CM | POA: Diagnosis present

## 2021-03-10 DIAGNOSIS — E871 Hypo-osmolality and hyponatremia: Secondary | ICD-10-CM | POA: Diagnosis present

## 2021-03-10 DIAGNOSIS — Z83438 Family history of other disorder of lipoprotein metabolism and other lipidemia: Secondary | ICD-10-CM

## 2021-03-10 DIAGNOSIS — R0602 Shortness of breath: Secondary | ICD-10-CM | POA: Diagnosis present

## 2021-03-10 DIAGNOSIS — J441 Chronic obstructive pulmonary disease with (acute) exacerbation: Principal | ICD-10-CM

## 2021-03-10 DIAGNOSIS — E785 Hyperlipidemia, unspecified: Secondary | ICD-10-CM | POA: Diagnosis present

## 2021-03-10 DIAGNOSIS — I350 Nonrheumatic aortic (valve) stenosis: Secondary | ICD-10-CM | POA: Diagnosis not present

## 2021-03-10 DIAGNOSIS — M1990 Unspecified osteoarthritis, unspecified site: Secondary | ICD-10-CM | POA: Diagnosis present

## 2021-03-10 DIAGNOSIS — Z79899 Other long term (current) drug therapy: Secondary | ICD-10-CM

## 2021-03-10 HISTORY — DX: Other nonspecific abnormal finding of lung field: R91.8

## 2021-03-10 LAB — BASIC METABOLIC PANEL
Anion gap: 6 (ref 5–15)
BUN: 14 mg/dL (ref 8–23)
CO2: 32 mmol/L (ref 22–32)
Calcium: 9.4 mg/dL (ref 8.9–10.3)
Chloride: 93 mmol/L — ABNORMAL LOW (ref 98–111)
Creatinine, Ser: 0.68 mg/dL (ref 0.61–1.24)
GFR, Estimated: >60 mL/min (ref >60)
Glucose, Bld: 93 mg/dL (ref 70–99)
Potassium: 3.7 mmol/L (ref 3.5–5.1)
Sodium: 131 mmol/L — ABNORMAL LOW (ref 135–145)

## 2021-03-10 LAB — CBC
HCT: 42.8 % (ref 39.0–52.0)
Hemoglobin: 14 g/dL (ref 13.0–17.0)
MCH: 29.4 pg (ref 26.0–34.0)
MCHC: 32.7 g/dL (ref 30.0–36.0)
MCV: 89.9 fL (ref 80.0–100.0)
Platelets: 269 10*3/uL (ref 150–400)
RBC: 4.76 MIL/uL (ref 4.22–5.81)
RDW: 14.6 % (ref 11.5–15.5)
WBC: 9.9 10*3/uL (ref 4.0–10.5)
nRBC: 0 % (ref 0.0–0.2)

## 2021-03-10 LAB — RESP PANEL BY RT-PCR (FLU A&B, COVID) ARPGX2
Influenza A by PCR: NEGATIVE
Influenza B by PCR: NEGATIVE
SARS Coronavirus 2 by RT PCR: NEGATIVE

## 2021-03-10 LAB — TROPONIN I (HIGH SENSITIVITY)
Troponin I (High Sensitivity): 12 ng/L (ref <18)
Troponin I (High Sensitivity): 12 ng/L (ref <18)

## 2021-03-10 MED ORDER — METHYLPREDNISOLONE SODIUM SUCC 40 MG IJ SOLR
40.0000 mg | Freq: Two times a day (BID) | INTRAMUSCULAR | Status: AC
  Administered 2021-03-11 (×2): 40 mg via INTRAVENOUS
  Filled 2021-03-10 (×2): qty 1

## 2021-03-10 MED ORDER — IPRATROPIUM-ALBUTEROL 0.5-2.5 (3) MG/3ML IN SOLN
3.0000 mL | Freq: Four times a day (QID) | RESPIRATORY_TRACT | Status: DC
  Administered 2021-03-11 – 2021-03-15 (×14): 3 mL via RESPIRATORY_TRACT
  Filled 2021-03-10 (×15): qty 3

## 2021-03-10 MED ORDER — PREDNISONE 20 MG PO TABS: 40.0000 mg | ORAL_TABLET | Freq: Every day | ORAL | Status: DC

## 2021-03-10 MED ORDER — CARVEDILOL 6.25 MG PO TABS
12.5000 mg | ORAL_TABLET | Freq: Two times a day (BID) | ORAL | Status: DC
  Administered 2021-03-11 – 2021-03-15 (×10): 12.5 mg via ORAL
  Filled 2021-03-10 (×10): qty 2

## 2021-03-10 MED ORDER — ACETAMINOPHEN 650 MG RE SUPP: 650.0000 mg | Freq: Four times a day (QID) | RECTAL | Status: DC | PRN

## 2021-03-10 MED ORDER — SODIUM CHLORIDE 0.9 % IV SOLN
500.0000 mg | Freq: Once | INTRAVENOUS | Status: AC
  Administered 2021-03-11: 500 mg via INTRAVENOUS
  Filled 2021-03-10: qty 5

## 2021-03-10 MED ORDER — ENOXAPARIN SODIUM 40 MG/0.4ML IJ SOSY
40.0000 mg | PREFILLED_SYRINGE | INTRAMUSCULAR | Status: DC
  Administered 2021-03-11 – 2021-03-15 (×5): 40 mg via SUBCUTANEOUS
  Filled 2021-03-10 (×5): qty 0.4

## 2021-03-10 MED ORDER — ONDANSETRON HCL 4 MG/2ML IJ SOLN: 4.0000 mg | Freq: Four times a day (QID) | INTRAMUSCULAR | Status: DC | PRN

## 2021-03-10 MED ORDER — METHYLPREDNISOLONE SODIUM SUCC 125 MG IJ SOLR
125.0000 mg | Freq: Once | INTRAMUSCULAR | Status: AC
  Administered 2021-03-10: 125 mg via INTRAVENOUS
  Filled 2021-03-10: qty 2

## 2021-03-10 MED ORDER — IPRATROPIUM-ALBUTEROL 0.5-2.5 (3) MG/3ML IN SOLN
3.0000 mL | Freq: Once | RESPIRATORY_TRACT | Status: AC
  Administered 2021-03-10: 3 mL via RESPIRATORY_TRACT
  Filled 2021-03-10: qty 3

## 2021-03-10 MED ORDER — ONDANSETRON HCL 4 MG PO TABS: 4.0000 mg | ORAL_TABLET | Freq: Four times a day (QID) | ORAL | Status: DC | PRN

## 2021-03-10 MED ORDER — IOHEXOL 350 MG/ML SOLN
75.0000 mL | Freq: Once | INTRAVENOUS | Status: AC | PRN
  Administered 2021-03-10: 75 mL via INTRAVENOUS

## 2021-03-10 MED ORDER — ACETAMINOPHEN 325 MG PO TABS: 650.0000 mg | ORAL_TABLET | Freq: Four times a day (QID) | ORAL | Status: DC | PRN

## 2021-03-10 MED ORDER — ALBUTEROL SULFATE (2.5 MG/3ML) 0.083% IN NEBU: 2.5000 mg | INHALATION_SOLUTION | RESPIRATORY_TRACT | Status: DC | PRN

## 2021-03-10 NOTE — ED Provider Notes (Addendum)
[facility] Emergency Department Provider Note   ____________________________________________   I have reviewed the triage vital signs and the nursing notes.   HISTORY  Chief Complaint Shortness of Breath   History limited by: Not Limited   HPI Curtis Campos is a 80 y.o. male who presents to the emergency department today because of concerns for shortness of breath and hypoxia.  Patient states this is been going on for the past few weeks.  He does check his oxygen levels at home as well as his other vital signs.  Recently he has found his oxygen level to be in the 80s.  Does have history of COPD and has an inhaler at home.  Does not currently have any supplemental oxygen at home although at one time had to be put on oxygen after a pneumonia infection.  Patient denies any chest pain.  Has noticed some swelling to his ankles.  The patient has not had any fevers.   Records reviewed. Per medical record review patient has a history of HLD, HTN.  Past Medical History:  Diagnosis Date   Agent orange exposure    Arthritis 03/12/2011   Chronic obstructive lung disease ([place])    Coronary artery disease 03/2013   a. 03/2013 Cath: LM nl, LAD 83m, D1 50, D2 30, LCX 66m/d (chronic), OM1 50, RCA 40p, 22m, RPL 40, RPDA 60, EF 25%, mod-sev MR-->Med mgmt; b. 08/2019 MV: basal inflat/mid inflat defect consistent w/ ischemia in LCX distribution. EF 37%. Intermediate risk.   HFrEF (heart failure with reduced ejection fraction) ([place])    a. 03/2013 TEE: EF 25%; b. 08/2017 Echo: EF 45-50%, Gr1 DD. Mod AS. Mod MR. Mildly dil LA; c. 06/2019 Echo: EF 30-35%, glob HK, gr1 DD; d. 06/2020 Echo: EF 55-60%, nl RV fxn, triv MR, Mod AS.   Hyperlipidemia    Hypertension    Ischemic cardiomyopathy    a. 03/2013 TEE: EF 25%; b. 08/2017 Echo: EF 45-50%; c. 06/2019 Echo: EF 30-35%; d. 06/2020 Echo: EF 55-60%.   Moderate aortic stenosis    a. 08/2017 Echo: Mod AS. Mean grad (S) 60mmHg. Valve area (VTI):  1.25cm^2; b. 06/2019 Echo: Mod AS. Mean grad 29mmHg, Valve area (VTI): 1.16cm^2); c. 06/2020 Mod AS. AVA 1.25cm^2, mean grad 39mmHg.   Moderate mitral regurgitation    a. 08/2017 Echo: mod MR; b. 06/2019 Echo: mild to mod MR.   Rectal cancer [facility])     Patient Active Problem List   Diagnosis Date Noted   Actinic otitis externa 04/17/2019   Cerumen impaction 04/03/2019   Preventative health care 02/07/2019   Acute exacerbation of chronic obstructive pulmonary disease (COPD) ([place]) 06/14/2018   Prediabetes 09/10/2017   Trauma and stressor-related disorder 01/26/2017   Medicare annual wellness visit, subsequent 06/23/2016   Chronic hyponatremia 06/23/2016   Rash and nonspecific skin eruption 03/22/2014   Mitral regurgitation    Chronic obstructive pulmonary disease ([place]) 04/21/2013   CHF with cardiomyopathy ([place]) 04/21/2013   Coronary artery disease 00/76/2263   Chronic systolic heart failure ([place]) 03/30/2013   Insomnia 03/29/2013   Multifocal atrial tachycardia ([place]) 01/17/2013   Arthritis 03/12/2011   Episodic mood disorder ([place]) 04/06/2008   Essential hypertension 03/06/2008   Nicotine dependence 03/10/2007   HLD (hyperlipidemia) 03/18/2006   Malignant neoplasm of rectum ([place]) 03/31/2003    Past Surgical History:  Procedure Laterality Date   APPENDECTOMY  06/2003   CARDIAC CATHETERIZATION  03/2013   [facility]   cataract surgery  COLOSTOMY     HERNIA REPAIR     resection of rectum      Prior to Admission medications   Medication Sig Start Date End Date Taking? Authorizing Provider  albuterol (VENTOLIN HFA) 108 (90 Base) MCG/ACT inhaler INHALE 2 PUFFS BY MOUTH EVERY 6 HOURS AS NEEDED FOR SHORTNESS OF BREATH 08/29/18   [name], NP  Alirocumab (PRALUENT) 75 MG/ML SOAJ Inject 1 pen into the skin every 14 (fourteen) days. 08/25/18   Wellington Hampshire, MD  aspirin 81 MG tablet Take 81 mg by mouth daily. Takes at night    [provider]  carvedilol (COREG) 12.5 MG  tablet TAKE 1 TABLET BY MOUTH TWICE DAILY 05/07/20   Wellington Hampshire, MD  cholecalciferol (VITAMIN D3) 25 MCG (1000 UNIT) tablet Take 1,000 Units by mouth daily.    [provider]  diclofenac Sodium (VOLTAREN) 1 % GEL APPLY 4 GRAMS TO AFFECTED AREA FOUR TIMES A DAY AS NEEDED FOR HIP PAIN-TOTAL DAILY DOSE SHOULD NOT EXCEED 32G PER DAY OVER ALL AFFECTEDJOINTS.FOR PAIN. 10/30/19   [provider]  ezetimibe (ZETIA) 10 MG tablet Take 1 tablet by mouth once daily for cholesterol. NEED APPOINTMENT FOR ANY MORE REFILLS 08/29/18   [name], NP  furosemide (LASIX) 20 MG tablet TAKE 1 TABLET BY MOUTH ONCE DAILY. 02/13/21   Wellington Hampshire, MD  Magnesium Oxide 400 (240 Mg) MG TABS TAKE 1 TABLET BY MOUTH ONCE DAILY. 05/03/17   Wellington Hampshire, MD  rosuvastatin (CRESTOR) 40 MG tablet  12/29/19   [provider]  sacubitril-valsartan (ENTRESTO) 24-26 MG Take 1 tablet by mouth 2 (two) times daily.    [provider]  traZODone (DESYREL) 50 MG tablet Take 1 tablet (50 mg total) by mouth at bedtime as needed. for sleep 08/29/18   [name], NP    Allergies Codeine and Statins  Family History  Problem Relation Age of Onset   Heart disease Mother    Heart disease Father    Hyperlipidemia Father    Hypertension Father    Heart attack Brother 54       MI   Heart disease Sister        stents placed    Cancer - Cervical Sister    Heart disease Sister        CABG    Social History Social History   Tobacco Use   Smoking status: Former    Packs/day: 1.00    Years: 50.00    Pack years: 50.00    Types: Cigarettes    Quit date: 03/25/2013    Years since quitting: 7.9   Smokeless tobacco: Never  Vaping Use   Vaping Use: Never used  Substance Use Topics   Alcohol use: No    Alcohol/week: 0.0 standard drinks   Drug use: No    Review of Systems Constitutional: No fever/chills Eyes: No visual changes. ENT: No sore throat. Cardiovascular: Denies  chest pain. Respiratory: Positive for cough and shortness of breath. Gastrointestinal: No abdominal pain.  No nausea, no vomiting.  No diarrhea.   Genitourinary: Negative for dysuria. Musculoskeletal: Negative for back pain. Skin: Negative for rash. Neurological: Negative for headaches, focal weakness or numbness.  ____________________________________________   PHYSICAL EXAM:  VITAL SIGNS: ED Triage Vitals  Enc Vitals Group     BP 03/10/21 1245 127/69     Pulse Rate 03/10/21 1245 75     Resp 03/10/21 1245 18  Temp 03/10/21 1245 (!) 97.5 F (36.4 C)     Temp Source 03/10/21 1245 Oral     SpO2 03/10/21 1245 90 %     Weight 03/10/21 1247 170 lb (77.1 kg)     Height 03/10/21 1247 5\' 6"  (1.676 m)     Head Circumference --      Peak Flow --      Pain Score 03/10/21 1247 0   Constitutional: Alert and oriented.  Eyes: Conjunctivae are normal.  ENT      Head: Normocephalic and atraumatic.      Nose: No congestion/rhinnorhea.      Mouth/Throat: Mucous membranes are moist.      Neck: No stridor. Hematological/Lymphatic/Immunilogical: No cervical lymphadenopathy. Cardiovascular: Normal rate, regular rhythm.  No murmurs, rubs, or gallops.  Respiratory: Slightly increased work of breathing. Diffuse wheezing.  Gastrointestinal: Soft and non tender. No rebound. No guarding.  Genitourinary: Deferred Musculoskeletal: Normal range of motion in all extremities. Trace lower extremity edema.  Neurologic:  Normal speech and language. No gross focal neurologic deficits are appreciated.  Skin:  Skin is warm, dry and intact. No rash noted. Psychiatric: Mood and affect are normal. Speech and behavior are normal. Patient exhibits appropriate insight and judgment.  ____________________________________________    LABS (pertinent positives/negatives)  COVID/flu negative Trop hs 12 BMP na 131, cl 93 otherwise wnl CBC wbc 9.9, hgb 14.0, plt  269  ____________________________________________   EKG  I, Nance Pear, attending physician, personally viewed and interpreted this EKG  EKG Time: 1253 Rate: 62 Rhythm: sinus rhythm  Axis: normal Intervals: qtc 430 QRS: narrow, waves v1, v2 ST changes: no st elevation Impression: abnormal ekg  ____________________________________________    RADIOLOGY  CXR No acute findings  CT angio PE No PE. COPD. Mucus Plug. pulmonary nodule  ____________________________________________   PROCEDURES  Procedures  ____________________________________________   INITIAL IMPRESSION / ASSESSMENT AND PLAN / ED COURSE  Pertinent labs & imaging results that were available during my care of the patient were reviewed by me and considered in my medical decision making (see chart for details).   Patient presented because of concern for shortness and breath and hypoxia. Patient checks o2 levels at home. History of COPD. X-ray and CT without evidence of pneumonia or blood clot. At this time do think likely COPD exacerbation. Given steroid and breathing treatment in ED. Will plan on admission to the hospitalist service.    ____________________________________________   FINAL CLINICAL IMPRESSION(S) / ED DIAGNOSES  Final diagnoses:  COPD exacerbation ([place])  Hypoxia     Note: This dictation was prepared with Dragon dictation. Any transcriptional errors that result from this process are unintentional     Nance Pear, MD 03/10/21 9480    Nance Pear, MD 03/10/21 [phone]

## 2021-03-10 NOTE — ED Notes (Signed)
Patient to CT.

## 2021-03-10 NOTE — ED Triage Notes (Signed)
Pt here with SOB since Thanksgiving. Pt does not wear oxygen at home, 84% on RA. Pt in NAD.

## 2021-03-10 NOTE — ED Notes (Signed)
Curtis Campos (Son)

## 2021-03-10 NOTE — ED Notes (Signed)
Dr. Archie Balboa at the bedside

## 2021-03-10 NOTE — ED Notes (Signed)
Patient placed in gown and monitor applied, NSR noted

## 2021-03-10 NOTE — H&P (Signed)
History and Physical    Curtis Campos EGB:[id number] DOB: 1940/11/04 DOA: 03/10/2021  PCP: [name], NP   Patient coming from: home  I have personally briefly reviewed patient's relevant medical records in [place]  Chief Complaint: shortness of breath  HPI: Curtis Campos is a 80 y.o. male with medical history significant for Systolic CHF with improved EF (EF 55 to 60% April 2022), CAD, moderate aortic stenosis, HTN, history of rectal cancer, COPD with prior history of O2 use though not currently, who presents to the ED with a 2-week history of wheezing and shortness of breath with home pulse oximeter checks in the 80s.  He denies chest pain, fever or chills .has mild lower extremity swelling bilaterally without pain.  He denies nausea, vomiting, abdominal pain or diarrhea.  He saw his cardiologist on 11/15 and did mention his worsening dyspnea and the plan was to get a follow-up echocardiogram evaluate and previously scheduled.  He was euvolemic at that visit.  ED course: Tachypneic at 22 with O2 sat 84% on room air, otherwise vitals within normal limits Blood work: Troponin 12-12 Mild hyponatremia of 131, otherwise unremarkable.  BNP not sent COVID and flu negative  EKG, personally viewed and interpreted: Sinus rhythm at 62 with no acute ST-T wave changes  CTA chest PE protocol shows no evidence of PE, COPD with increased changes of lower lobe infrahilar bronchitis, mild cardiomegaly, no pericardial or pleural effusion, no pulmonary edema  Patient treated with duo nebs x3, methylprednisolone and azithromycin.  Hospitalist consulted for admission.     Review of Systems: As per HPI otherwise all other systems on review of systems negative.   Assessment/Plan   Acute respiratory failure with hypoxia (HCC) -Suspect mostly related to COPD with some element of CHF - Supplemental oxygen - Treat as outlined below  COPD with acute exacerbation ([place])   - Suspect  underlying chronic respiratory failure - Schedule and as needed nebulized bronchodilator treatment - IV steroids - Supplemental O2 to keep sats over 37%   Chronic systolic heart failure with possible mild exacerbation (HCC) - Appears mildly fluid overloaded - We will get BNP - Echocardiogram in the a.m. - Continue Entresto pending med verification.  Continue carvedilol and home furosemide - Cardiology consult     Essential hypertension - Continue home meds    Coronary artery disease - No complaints of chest pain, EKG nonacute and troponins negative x2 - Continue aspirin, carvedilol, rosuvastatin    Moderate aortic stenosis - No acute disease suspected   DVT prophylaxis: Lovenox  Code Status: full code  Family Communication: Son at bedside Disposition Plan: Back to previous home environment Consults called: Cardiology Status:At the time of admission, it appears that the appropriate admission status for this patient is INPATIENT. This is judged to be reasonable and necessary in order to provide the required intensity of service to ensure the patient's safety given the presenting symptoms, physical exam findings, and initial radiographic and laboratory data in the context of their  Comorbid conditions.   Patient requires inpatient status due to high intensity of service, high risk for further deterioration and high frequency of surveillance required.   I certify that at the point of admission it is my clinical judgment that the patient will require inpatient hospital care spanning beyond 2 midnights     Physical Exam: Vitals:   03/10/21 1245 03/10/21 1247 03/10/21 2140 03/10/21 2200  BP: 127/69  (!) 154/85 129/89  Pulse: 75  73  66  Resp: 18  (!) 22 (!) 23  Temp: (!) 97.5 F (36.4 C)     TempSrc: Oral     SpO2: 90%  93% 92%  Weight:  77.1 kg    Height:  5\' 6"  (1.676 m)     Constitutional: Alert, oriented x 3 .  Mild conversational dyspnea HEENT:      Head: Normocephalic  and atraumatic.         Eyes: PERLA, EOMI, Conjunctivae are normal. Sclera is non-icteric.       Mouth/Throat: Mucous membranes are moist.       Neck: Supple with no signs of meningismus. Cardiovascular: Regular rate and rhythm. No murmurs, gallops, or rubs. 2+ symmetrical distal pulses are present . No JVD.  1+ LE edema Respiratory: Respiratory effort increased with scattered wheezes few bibasilar rales Gastrointestinal: Soft, non tender, non distended. Positive bowel sounds.  Genitourinary: No CVA tenderness. Musculoskeletal: Nontender with normal range of motion in all extremities. No cyanosis, or erythema of extremities. Neurologic:  Face is symmetric. Moving all extremities. No gross focal neurologic deficits . Skin: Skin is warm, dry.  No rash or ulcers Psychiatric: Mood and affect are appropriate     Past Medical History:  Diagnosis Date   Agent orange exposure    Arthritis 03/12/2011   Chronic obstructive lung disease ([place])    Coronary artery disease 03/2013   a. 03/2013 Cath: LM nl, LAD 78m, D1 50, D2 30, LCX 83m/d (chronic), OM1 50, RCA 40p, 64m, RPL 40, RPDA 60, EF 25%, mod-sev MR-->Med mgmt; b. 08/2019 MV: basal inflat/mid inflat defect consistent w/ ischemia in LCX distribution. EF 37%. Intermediate risk.   HFrEF (heart failure with reduced ejection fraction) ([place])    a. 03/2013 TEE: EF 25%; b. 08/2017 Echo: EF 45-50%, Gr1 DD. Mod AS. Mod MR. Mildly dil LA; c. 06/2019 Echo: EF 30-35%, glob HK, gr1 DD; d. 06/2020 Echo: EF 55-60%, nl RV fxn, triv MR, Mod AS.   Hyperlipidemia    Hypertension    Ischemic cardiomyopathy    a. 03/2013 TEE: EF 25%; b. 08/2017 Echo: EF 45-50%; c. 06/2019 Echo: EF 30-35%; d. 06/2020 Echo: EF 55-60%.   Moderate aortic stenosis    a. 08/2017 Echo: Mod AS. Mean grad (S) 88mmHg. Valve area (VTI): 1.25cm^2; b. 06/2019 Echo: Mod AS. Mean grad 58mmHg, Valve area (VTI): 1.16cm^2); c. 06/2020 Mod AS. AVA 1.25cm^2, mean grad 23mmHg.   Moderate mitral regurgitation    a.  08/2017 Echo: mod MR; b. 06/2019 Echo: mild to mod MR.   Rectal cancer [facility])     Past Surgical History:  Procedure Laterality Date   APPENDECTOMY  06/2003   CARDIAC CATHETERIZATION  03/2013   [facility]   cataract surgery     COLOSTOMY     HERNIA REPAIR     resection of rectum       reports that he quit smoking about 7 years ago. His smoking use included cigarettes. He has a 50.00 pack-year smoking history. He has never used smokeless tobacco. He reports that he does not drink alcohol and does not use drugs.  Allergies  Allergen Reactions   Codeine Nausea Only   Statins Other (See Comments)    REACTION: maylgias    Family History  Problem Relation Age of Onset   Heart disease Mother    Heart disease Father    Hyperlipidemia Father    Hypertension Father    Heart attack Brother 56  MI   Heart disease Sister        stents placed    Cancer - Cervical Sister    Heart disease Sister        CABG      Prior to Admission medications   Medication Sig Start Date End Date Taking? Authorizing Provider  albuterol (VENTOLIN HFA) 108 (90 Base) MCG/ACT inhaler INHALE 2 PUFFS BY MOUTH EVERY 6 HOURS AS NEEDED FOR SHORTNESS OF BREATH 08/29/18   [name], NP  Alirocumab (PRALUENT) 75 MG/ML SOAJ Inject 1 pen into the skin every 14 (fourteen) days. 08/25/18   Wellington Hampshire, MD  aspirin 81 MG tablet Take 81 mg by mouth daily. Takes at night    [provider]  carvedilol (COREG) 12.5 MG tablet TAKE 1 TABLET BY MOUTH TWICE DAILY 05/07/20   Wellington Hampshire, MD  cholecalciferol (VITAMIN D3) 25 MCG (1000 UNIT) tablet Take 1,000 Units by mouth daily.    [provider]  diclofenac Sodium (VOLTAREN) 1 % GEL APPLY 4 GRAMS TO AFFECTED AREA FOUR TIMES A DAY AS NEEDED FOR HIP PAIN-TOTAL DAILY DOSE SHOULD NOT EXCEED 32G PER DAY OVER ALL AFFECTEDJOINTS.FOR PAIN. 10/30/19   [provider]  ezetimibe (ZETIA) 10 MG tablet Take 1 tablet by mouth once daily for cholesterol.  NEED APPOINTMENT FOR ANY MORE REFILLS 08/29/18   [name], NP  furosemide (LASIX) 20 MG tablet TAKE 1 TABLET BY MOUTH ONCE DAILY. 02/13/21   Wellington Hampshire, MD  Magnesium Oxide 400 (240 Mg) MG TABS TAKE 1 TABLET BY MOUTH ONCE DAILY. 05/03/17   Wellington Hampshire, MD  rosuvastatin (CRESTOR) 40 MG tablet  12/29/19   [provider]  sacubitril-valsartan (ENTRESTO) 24-26 MG Take 1 tablet by mouth 2 (two) times daily.    [provider]  traZODone (DESYREL) 50 MG tablet Take 1 tablet (50 mg total) by mouth at bedtime as needed. for sleep 08/29/18   [name], NP      Labs on Admission: I have personally reviewed following labs and imaging studies  CBC: Recent Labs  Lab 03/10/21 1254  WBC 9.9  HGB 14.0  HCT 42.8  MCV 89.9  PLT 626   Basic Metabolic Panel: Recent Labs  Lab 03/10/21 1254  NA 131*  K 3.7  CL 93*  CO2 32  GLUCOSE 93  BUN 14  CREATININE 0.68  CALCIUM 9.4   GFR: Estimated Creatinine Clearance: 72 mL/min (by C-G formula based on SCr of 0.68 mg/dL). Liver Function Tests: No results for input(s): AST, ALT, ALKPHOS, BILITOT, PROT, ALBUMIN in the last 168 hours. No results for input(s): LIPASE, AMYLASE in the last 168 hours. No results for input(s): AMMONIA in the last 168 hours. Coagulation Profile: No results for input(s): INR, PROTIME in the last 168 hours. Cardiac Enzymes: No results for input(s): CKTOTAL, CKMB, CKMBINDEX, TROPONINI in the last 168 hours. BNP (last 3 results) No results for input(s): PROBNP in the last 8760 hours. HbA1C: No results for input(s): HGBA1C in the last 72 hours. CBG: No results for input(s): GLUCAP in the last 168 hours. Lipid Profile: No results for input(s): CHOL, HDL, LDLCALC, TRIG, CHOLHDL, LDLDIRECT in the last 72 hours. Thyroid Function Tests: No results for input(s): TSH, T4TOTAL, FREET4, T3FREE, THYROIDAB in the last 72 hours. Anemia Panel: No results for input(s): VITAMINB12, FOLATE,  FERRITIN, TIBC, IRON, RETICCTPCT in the last 72 hours. Urine analysis:    Component Value Date/Time   COLORURINE Yellow  04/03/2012 1819   APPEARANCEUR Hazy 04/03/2012 1819   LABSPEC 1.010 04/03/2012 1819   PHURINE 6.0 04/03/2012 1819   GLUCOSEU Negative 04/03/2012 1819   HGBUR Negative 04/03/2012 1819   BILIRUBINUR Negative 04/03/2012 1819   KETONESUR 1+ 04/03/2012 1819   PROTEINUR Negative 04/03/2012 1819   NITRITE Negative 04/03/2012 1819   LEUKOCYTESUR Negative 04/03/2012 1819    Radiological Exams on Admission: DG Chest 2 View  Result Date: 03/10/2021 CLINICAL DATA:  Chest pain and shortness of breath. EXAM: CHEST - 2 VIEW COMPARISON:  06/10/2015 and CT chest 03/01/2013. FINDINGS: Trachea is midline. Heart size normal. Thoracic aorta is calcified. Biapical pleuroparenchymal scarring, as on 03/01/2013. Additional scarring in the lingula. No airspace consolidation or pleural fluid. IMPRESSION: No acute findings. Electronically Signed   By: Lorin Picket M.D.   On: 03/10/2021 13:21   CT Angio Chest PE W and/or Wo Contrast  Result Date: 03/10/2021 CLINICAL DATA:  Shortness of breath and hypoxia. EXAM: CT ANGIOGRAPHY CHEST WITH CONTRAST TECHNIQUE: Multidetector CT imaging of the chest was performed using the standard protocol during bolus administration of intravenous contrast. Multiplanar CT image reconstructions and MIPs were obtained to evaluate the vascular anatomy. CONTRAST:  56mL OMNIPAQUE IOHEXOL 350 MG/ML SOLN COMPARISON:  Chest CT with IV contrast 03/01/2013, PA and lateral chest today, PA Lat chest 06/10/2015 FINDINGS: Cardiovascular: There is mild cardiomegaly with left chamber predominance. Once again the superior pulmonary veins are mildly distended but no more than previously. The pulmonary arteries are normal in caliber without evidence of thromboemboli or right heart strain. There is no pericardial effusion. There is moderate to heavy patchy three-vessel calcific CAD and  moderate to heavy aortic calcific plaques extending into the great vessels. No great vessel or aortic aneurysm or stenosis is seen. The ascending aorta is ectatic at 3.7 cm caliber, the descending aorta ectatic up to 3.3 cm with slight aortic tortuosity. Scattered mixed plaque involves the descending aorta without visible ulcerative plaque or dissection. Mediastinum/Nodes: Stable subcarinal nodal complex 1.2 cm in short axis. Few stable slightly prominent hilar lymph nodes also noted with stable borderline prominent right paratracheal and precarinal nodes. No new or worsening adenopathy. Lungs/Pleura: There are mild centrilobular emphysematous changes and bilateral apical pleuroparenchymal scarring, with paraseptal emphysematous changes asymmetrically in the right apex. There is new demonstration of a 6.1 mm noncalcified nodule in the left upper lobe posteriorly on series 6 axial 38. Small chronic consolidation in the lingular base is unchanged as well as a few basilar scattered linear scar-like opacities. No pleural effusion or pneumothorax. There is interval increased bilateral infrahilar lower lobe bronchial thickening of bronchitis with bronchiolar mucous plugging on the right. Upper Abdomen: Scattered calcified granulomas both liver and spleen. Musculoskeletal: There is osteopenia and degenerative change of the thoracic spine. Mild gynecomastia. Review of the MIP images confirms the above findings. IMPRESSION: 1. No evidence of arterial embolus or dilatation. 2. Aortic atherosclerosis.  Coronary artery atherosclerosis. 3. COPD with increased changes of lower lobe infrahilar bronchitis with infrahilar bronchiolar mucous plugging in the right lower lobe. 4. Mild cardiomegaly and mild chronic distention of the superior pulmonary veins. No pericardial or pleural effusion. No pulmonary edema is seen. 5. Biapical pleural-parenchymal scarring changes. 6. New 6.1 mm noncalcified left upper lobe nodule. Six-month  follow-up chest CT recommended. 7. Stable borderline and slightly enlarged thoracic lymph nodes. 8. Mild subareolar gynecomastia. Electronically Signed   By: Telford Nab M.D.   On: 03/10/2021 22:48       Walt [employer]  Lucile Shutters MD [facility]   03/10/2021, 11:27 PM

## 2021-03-11 ENCOUNTER — Encounter: Payer: Self-pay | Admitting: Internal Medicine

## 2021-03-11 DIAGNOSIS — J9601 Acute respiratory failure with hypoxia: Secondary | ICD-10-CM

## 2021-03-11 DIAGNOSIS — I350 Nonrheumatic aortic (valve) stenosis: Secondary | ICD-10-CM

## 2021-03-11 DIAGNOSIS — I2510 Atherosclerotic heart disease of native coronary artery without angina pectoris: Secondary | ICD-10-CM

## 2021-03-11 DIAGNOSIS — I10 Essential (primary) hypertension: Secondary | ICD-10-CM

## 2021-03-11 DIAGNOSIS — I5022 Chronic systolic (congestive) heart failure: Secondary | ICD-10-CM

## 2021-03-11 DIAGNOSIS — I5023 Acute on chronic systolic (congestive) heart failure: Secondary | ICD-10-CM

## 2021-03-11 LAB — CBC
HCT: 43.1 % (ref 39.0–52.0)
Hemoglobin: 14.2 g/dL (ref 13.0–17.0)
MCH: 29 pg (ref 26.0–34.0)
MCHC: 32.9 g/dL (ref 30.0–36.0)
MCV: 88.1 fL (ref 80.0–100.0)
Platelets: 274 10*3/uL (ref 150–400)
RBC: 4.89 MIL/uL (ref 4.22–5.81)
RDW: 14.5 % (ref 11.5–15.5)
WBC: 10.8 10*3/uL — ABNORMAL HIGH (ref 4.0–10.5)
nRBC: 0 % (ref 0.0–0.2)

## 2021-03-11 LAB — BRAIN NATRIURETIC PEPTIDE: B Natriuretic Peptide: 282.5 pg/mL — ABNORMAL HIGH (ref 0.0–100.0)

## 2021-03-11 LAB — CREATININE, SERUM
Creatinine, Ser: 0.55 mg/dL — ABNORMAL LOW (ref 0.61–1.24)
GFR, Estimated: >60 mL/min (ref >60)

## 2021-03-11 MED ORDER — SACUBITRIL-VALSARTAN 24-26 MG PO TABS
1.0000 | ORAL_TABLET | Freq: Two times a day (BID) | ORAL | Status: DC
  Administered 2021-03-11 – 2021-03-15 (×8): 1 via ORAL
  Filled 2021-03-11 (×11): qty 1

## 2021-03-11 MED ORDER — FUROSEMIDE 10 MG/ML IJ SOLN
40.0000 mg | Freq: Once | INTRAMUSCULAR | Status: AC
  Administered 2021-03-11: 10:00:00 40 mg via INTRAVENOUS
  Filled 2021-03-11: qty 4

## 2021-03-11 MED ORDER — FUROSEMIDE 20 MG PO TABS
20.0000 mg | ORAL_TABLET | Freq: Every day | ORAL | Status: DC
  Administered 2021-03-11 – 2021-03-14 (×4): 20 mg via ORAL
  Filled 2021-03-11 (×4): qty 1

## 2021-03-11 MED ORDER — VITAMIN D 25 MCG (1000 UNIT) PO TABS
1000.0000 [IU] | ORAL_TABLET | Freq: Every day | ORAL | Status: DC
  Administered 2021-03-11 – 2021-03-15 (×5): 1000 [IU] via ORAL
  Filled 2021-03-11 (×5): qty 1

## 2021-03-11 MED ORDER — ASPIRIN EC 81 MG PO TBEC
81.0000 mg | DELAYED_RELEASE_TABLET | Freq: Every day | ORAL | Status: DC
  Administered 2021-03-11 – 2021-03-15 (×5): 81 mg via ORAL
  Filled 2021-03-11 (×5): qty 1

## 2021-03-11 NOTE — Progress Notes (Addendum)
PROGRESS NOTE  Curtis Campos IDP:[id number] DOB: [birth year] DOA: 03/10/2021 PCP: [name], NP   LOS: 1 day   Brief narrative:  Curtis Campos is a 80 y.o. male with past medical history of systolic congestive heart failure with improved ejection fraction with last LV ejection fraction of 55 to 60% in April 2022, history of coronary artery disease, moderate aortic stenosis, history of rectal cancer, COPD with history of previous oxygen use though not on oxygen currently presented to hospital with 2 weeks history of wheezing and shortness of breath with low pulse ox in the 80s at home.  Patient had seen his cardiologist on 02/11/21 and follow-up echocardiogram was scheduled at that time.  In the ED patient was noted to be tachypneic with respiratory rate at 22 with pulse ox of 84% on room air.  Troponin borderline.  There was note of mild hyponatremia with sodium of 131.  COVID and flu was negative.  EKG showed normal sinus rhythm.  CTA chest PE protocol showed no evidence of PE but COPD with increased changes of bronchitis.  In the ED patient was given duo nebs Solu-Medrol azithromycin and was admitted hospital for further evaluation and treatment  Assessment/Plan:  Principal Problem:   COPD with acute exacerbation ([place]) Active Problems:   Essential hypertension   Acute respiratory failure with hypoxia (HCC)   Coronary artery disease   Chronic systolic heart failure (HCC)   Moderate aortic stenosis  Acute respiratory failure with hypoxia likely secondary to COPD exacerbation with possible CHF exacerbation.  Continue nebulizer bronchodilators, steroids and diuretics and add incentive spirometry.  COPD with acute exacerbation  Continue oxygen, wean as tolerated.  Continue Diuretics and steroids.  Closely monitor.  CTA of the chest notable for COPD.    Chronic systolic heart failure with possible mild exacerbation Patient appeared to be mildly volume overloaded on presentation.   BNP elevated at 282.  Check 2D echocardiogram.  Continue Coreg.  Cardiology has been consulted for further evaluation.  History of moderate aortic stenosis.  Patient is on Lasix and Entresto at home.  This has been resumed.  Received 1 dose of IV Lasix today.     Essential hypertension History of orthostatic hypotension in the past causing limited Entresto dose and discontinuation of spironolactone.  Continue Coreg.  Closely monitor blood pressure.     Coronary artery disease No acute issues.  Troponins were negative.  EKG nonacute.  Continue aspirin, Coreg, Crestor.    Moderate aortic stenosis moderate mitral regurgitation. Check 2D echocardiogram, patient is concerned about his valvular heart disease  Left upper lobe nodule.  New 6.1 mm noncalcified nodule on the CT scan.  Radiology recommends 64-month follow-up with  DVT prophylaxis: enoxaparin (LOVENOX) injection 40 mg Start: 03/11/21 0800   Code Status: Full code  Family Communication:  Spoke with the patient at bedside.  Status is: Inpatient  Remains inpatient appropriate because: COPD exacerbation, decompensated CHF   Consultants: Cardiology  Procedures: None  Anti-infectives:  None  Anti-infectives (From admission, onward)    Start     Dose/Rate Route Frequency Ordered Stop   03/10/21 2300  azithromycin (ZITHROMAX) 500 mg in sodium chloride 0.9 % 250 mL IVPB        500 mg 250 mL/hr over 60 Minutes Intravenous  Once 03/10/21 2251 03/11/21 0204       Subjective: Today, patient was seen and examined at bedside.  Patient states that he feels little better.  He does have mild cough  and mild shortness of breath.  Denies any chest pain, dizziness lightheadedness but has mild cough.  Objective: Vitals:   03/11/21 0153 03/11/21 0536  BP: (!) 142/74 129/71  Pulse: 67 67  Resp: 18 18  Temp: 97.6 F (36.4 C)   SpO2: 91% (!) 89%    Intake/Output Summary (Last 24 hours) at 03/11/2021 0730 Last data filed at  03/11/2021 0503 Gross per 24 hour  Intake 490.08 ml  Output 850 ml  Net -359.92 ml   Filed Weights   03/10/21 1247 03/11/21 0153  Weight: 77.1 kg 74.8 kg   Body mass index is 26.62 kg/m.   Physical Exam: GENERAL: Patient is alert awake and oriented. Not in obvious distress.  On nasal cannula oxygen. HENT: No scleral pallor or icterus. Pupils equally reactive to light. Oral mucosa is moist NECK: is supple, no gross swelling noted. CHEST:   Diminished breath sounds bilaterally.  Crackles noted bilaterally. CVS: S1 and S2 heard, no murmur. Regular rate and rhythm.  ABDOMEN: Soft, non-tender, bowel sounds are present. EXTREMITIES: Bilateral trace edema noted. CNS: Cranial nerves are intact. No focal motor deficits. SKIN: warm and dry without rashes.  Data Review: I have personally reviewed the following laboratory data and studies,  CBC: Recent Labs  Lab 03/10/21 1254 03/11/21 0013  WBC 9.9 10.8*  HGB 14.0 14.2  HCT 42.8 43.1  MCV 89.9 88.1  PLT 269 101   Basic Metabolic Panel: Recent Labs  Lab 03/10/21 1254 03/11/21 0013  NA 131*  --   K 3.7  --   CL 93*  --   CO2 32  --   GLUCOSE 93  --   BUN 14  --   CREATININE 0.68 0.55*  CALCIUM 9.4  --    Liver Function Tests: No results for input(s): AST, ALT, ALKPHOS, BILITOT, PROT, ALBUMIN in the last 168 hours. No results for input(s): LIPASE, AMYLASE in the last 168 hours. No results for input(s): AMMONIA in the last 168 hours. Cardiac Enzymes: No results for input(s): CKTOTAL, CKMB, CKMBINDEX, TROPONINI in the last 168 hours. BNP (last 3 results) No results for input(s): BNP in the last 8760 hours.  ProBNP (last 3 results) No results for input(s): PROBNP in the last 8760 hours.  CBG: No results for input(s): GLUCAP in the last 168 hours. Recent Results (from the past 240 hour(s))  Resp Panel by RT-PCR (Flu A&B, Covid) Nasopharyngeal Swab     Status: None   Collection Time: 03/10/21 12:57 PM   Specimen:  Nasopharyngeal Swab; Nasopharyngeal(NP) swabs in vial transport medium  Result Value Ref Range Status   SARS Coronavirus 2 by RT PCR NEGATIVE NEGATIVE Final    Comment: (NOTE) SARS-CoV-2 target nucleic acids are NOT DETECTED.  The SARS-CoV-2 RNA is generally detectable in upper respiratory specimens during the acute phase of infection. The lowest concentration of SARS-CoV-2 viral copies this assay can detect is 138 copies/mL. A negative result does not preclude SARS-Cov-2 infection and should not be used as the sole basis for treatment or other patient management decisions. A negative result may occur with  improper specimen collection/handling, submission of specimen other than nasopharyngeal swab, presence of viral mutation(s) within the areas targeted by this assay, and inadequate number of viral copies(<138 copies/mL). A negative result must be combined with clinical observations, patient history, and epidemiological information. The expected result is Negative.  Fact Sheet for Patients:  EntrepreneurPulse.com.au  Fact Sheet for Healthcare Providers:  IncredibleEmployment.be  This test is no t  yet approved or cleared by the [place] and  has been authorized for detection and/or diagnosis of SARS-CoV-2 by FDA under an Emergency Use Authorization (EUA). This EUA will remain  in effect (meaning this test can be used) for the duration of the COVID-19 declaration under Section 564(b)(1) of the Act, 21 U.S.C.section 360bbb-3(b)(1), unless the authorization is terminated  or revoked sooner.       Influenza A by PCR NEGATIVE NEGATIVE Final   Influenza B by PCR NEGATIVE NEGATIVE Final    Comment: (NOTE) The Xpert Xpress SARS-CoV-2/FLU/RSV plus assay is intended as an aid in the diagnosis of influenza from Nasopharyngeal swab specimens and should not be used as a sole basis for treatment. Nasal washings and aspirates are unacceptable for  Xpert Xpress SARS-CoV-2/FLU/RSV testing.  Fact Sheet for Patients: EntrepreneurPulse.com.au  Fact Sheet for Healthcare Providers: IncredibleEmployment.be  This test is not yet approved or cleared by the [place] FDA and has been authorized for detection and/or diagnosis of SARS-CoV-2 by FDA under an Emergency Use Authorization (EUA). This EUA will remain in effect (meaning this test can be used) for the duration of the COVID-19 declaration under Section 564(b)(1) of the Act, 21 U.S.C. section 360bbb-3(b)(1), unless the authorization is terminated or revoked.  Performed at [facility], [address], [place], [place] [address]      Studies: DG Chest 2 View  Result Date: 03/10/2021 CLINICAL DATA:  Chest pain and shortness of breath. EXAM: CHEST - 2 VIEW COMPARISON:  06/10/2015 and CT chest 03/01/2013. FINDINGS: Trachea is midline. Heart size normal. Thoracic aorta is calcified. Biapical pleuroparenchymal scarring, as on 03/01/2013. Additional scarring in the lingula. No airspace consolidation or pleural fluid. IMPRESSION: No acute findings. Electronically Signed   By: Lorin Picket M.D.   On: 03/10/2021 13:21   CT Angio Chest PE W and/or Wo Contrast  Result Date: 03/10/2021 CLINICAL DATA:  Shortness of breath and hypoxia. EXAM: CT ANGIOGRAPHY CHEST WITH CONTRAST TECHNIQUE: Multidetector CT imaging of the chest was performed using the standard protocol during bolus administration of intravenous contrast. Multiplanar CT image reconstructions and MIPs were obtained to evaluate the vascular anatomy. CONTRAST:  23mL OMNIPAQUE IOHEXOL 350 MG/ML SOLN COMPARISON:  Chest CT with IV contrast 03/01/2013, PA and lateral chest today, PA Lat chest 06/10/2015 FINDINGS: Cardiovascular: There is mild cardiomegaly with left chamber predominance. Once again the superior pulmonary veins are mildly distended but no more than previously. The pulmonary  arteries are normal in caliber without evidence of thromboemboli or right heart strain. There is no pericardial effusion. There is moderate to heavy patchy three-vessel calcific CAD and moderate to heavy aortic calcific plaques extending into the great vessels. No great vessel or aortic aneurysm or stenosis is seen. The ascending aorta is ectatic at 3.7 cm caliber, the descending aorta ectatic up to 3.3 cm with slight aortic tortuosity. Scattered mixed plaque involves the descending aorta without visible ulcerative plaque or dissection. Mediastinum/Nodes: Stable subcarinal nodal complex 1.2 cm in short axis. Few stable slightly prominent hilar lymph nodes also noted with stable borderline prominent right paratracheal and precarinal nodes. No new or worsening adenopathy. Lungs/Pleura: There are mild centrilobular emphysematous changes and bilateral apical pleuroparenchymal scarring, with paraseptal emphysematous changes asymmetrically in the right apex. There is new demonstration of a 6.1 mm noncalcified nodule in the left upper lobe posteriorly on series 6 axial 38. Small chronic consolidation in the lingular base is unchanged as well as a few basilar scattered linear scar-like opacities.  No pleural effusion or pneumothorax. There is interval increased bilateral infrahilar lower lobe bronchial thickening of bronchitis with bronchiolar mucous plugging on the right. Upper Abdomen: Scattered calcified granulomas both liver and spleen. Musculoskeletal: There is osteopenia and degenerative change of the thoracic spine. Mild gynecomastia. Review of the MIP images confirms the above findings. IMPRESSION: 1. No evidence of arterial embolus or dilatation. 2. Aortic atherosclerosis.  Coronary artery atherosclerosis. 3. COPD with increased changes of lower lobe infrahilar bronchitis with infrahilar bronchiolar mucous plugging in the right lower lobe. 4. Mild cardiomegaly and mild chronic distention of the superior pulmonary  veins. No pericardial or pleural effusion. No pulmonary edema is seen. 5. Biapical pleural-parenchymal scarring changes. 6. New 6.1 mm noncalcified left upper lobe nodule. Six-month follow-up chest CT recommended. 7. Stable borderline and slightly enlarged thoracic lymph nodes. 8. Mild subareolar gynecomastia. Electronically Signed   By: Telford Nab M.D.   On: 03/10/2021 22:48      Flora Lipps, MD  [facility] 03/11/2021  If 7PM-7AM, please contact night-coverage

## 2021-03-11 NOTE — Consult Note (Signed)
Cardiology Consult    Patient ID: Curtis Campos MRN: [id number], DOB/AGE: 1940/10/11   Admit date: 03/10/2021 Date of Consult: 03/11/2021  Primary Physician: [name], NP Primary Cardiologist: Kathlyn Sacramento, MD Requesting Provider: L. Pokhrel, MD  Patient Profile    Curtis Campos is a 80 y.o. male with a history of CAD, ischemic cardiomyopathy, HFrEF, moderate aortic stenosis, moderate mitral regurgitation, hypertension, hyperlipidemia with statin intolerance, remote tobacco abuse, and COPD, who is being seen today for the evaluation of dyspnea in the setting of COPD flare at the request of Dr. Louanne Belton.  Past Medical History   Past Medical History:  Diagnosis Date   Agent orange exposure    Arthritis 03/12/2011   Chronic obstructive lung disease ([place])    Coronary artery disease 03/2013   a. 03/2013 Cath: LM nl, LAD 44m, D1 50, D2 30, LCX 58m/d (chronic), OM1 50, RCA 40p, 25m, RPL 40, RPDA 60, EF 25%, mod-sev MR-->Med mgmt; b. 08/2019 MV: basal inflat/mid inflat defect consistent w/ ischemia in LCX distribution. EF 37%. Intermediate risk.   HFrEF (heart failure with reduced ejection fraction) ([place])    a. 03/2013 TEE: EF 25%; b. 08/2017 Echo: EF 45-50%, Gr1 DD. Mod AS. Mod MR. Mildly dil LA; c. 06/2019 Echo: EF 30-35%, glob HK, gr1 DD; d. 06/2020 Echo: EF 55-60%, nl RV fxn, triv MR, Mod AS.   Hyperlipidemia    Hypertension    Ischemic cardiomyopathy    a. 03/2013 TEE: EF 25%; b. 08/2017 Echo: EF 45-50%; c. 06/2019 Echo: EF 30-35%; d. 06/2020 Echo: EF 55-60%.   Moderate aortic stenosis    a. 08/2017 Echo: Mod AS. Mean grad (S) 68mmHg. Valve area (VTI): 1.25cm^2; b. 06/2019 Echo: Mod AS. Mean grad 49mmHg, Valve area (VTI): 1.16cm^2); c. 06/2020 Mod AS. AVA 1.25cm^2, mean grad 80mmHg.   Moderate mitral regurgitation    a. 08/2017 Echo: mod MR; b. 06/2019 Echo: mild to mod MR.   Rectal cancer [facility])     Past Surgical History:  Procedure Laterality Date   APPENDECTOMY  06/2003    CARDIAC CATHETERIZATION  03/2013   [facility]   cataract surgery     COLOSTOMY     HERNIA REPAIR     resection of rectum       Allergies  Allergies  Allergen Reactions   Codeine Nausea Only   Statins Other (See Comments)    REACTION: maylgias    History of Present Illness    80 year old male with above past medical history including HFrEF, ischemic cardiomyopathy, moderate aortic stenosis, moderate mitral regurgitation, hypertension, hyperlipidemia with statin intolerance, rectal cancer, remote tobacco abuse, and COPD.  In January 2015, he was diagnosed with cardiomyopathy after echocardiogram showed an EF of 25% with moderate mitral regurgitation.  Cardiac catheterization showed severe one-vessel CAD involving a subtotal occlusion of the left circumflex, which appear to be chronic.  He otherwise had nonobstructive disease.  There was concern for moderate to severe mitral regurgitation however, follow-up TEE showed only moderate mitral regurgitation and he has been medically managed.  EF improved to 45 to 50% by echo in June 2019 but was lower in April 2021 at 30 to 35%.  Myoview in June 2021 showed a medium defect of moderate severity in the basal inferolateral and mid inferolateral location consistent with ischemia in the circumflex distribution.  EF was 37%.  Given known occlusion of the circumflex and absence of chest pain, ongoing medical therapy was recommended.  His most recent echocardiogram was  performed in April 2022 following initiation of Entresto.  This showed an EF of 55 to 60% with trivial MR and stable, moderate aortic stenosis (valve area 1.25 cm grade).  Due to hypotension, we had to reduce his Entresto to half a tablet twice a day (half of 49/51 mg tablets), and discontinue spironolactone.  [name] was last seen in cardiology clinic on February 11, 2021, at which time he was felt to be euvolemic however reported worsening exertional dyspnea.  In that setting, plan was made for  repeat outpatient echo and earlier interval than previously planned.  Unfortunately, beginning around Thanksgiving, he noted worsening of baseline level of DOE along w/ productive cough and wheezing.  His wt had been trending down, though he did note some increase in lower ext edema and orthopnea.  Over the past few days, his secretions turned from white to green.  He also became more dyspneic @ rest, w/ O2 sats in the 80's on home pulse ox, prompting him to present to the [facility] ED on 12/12.  Here, O2 sat was 84% on RA.  ECG was w/o acute changes.  Labs were notable for mild hyponatremia (appears chronic), and elevated BNP of 282.5.  HsTrops nl @ 12  12.  CXR was w/o acute findings, and though CTA chest was neg for PE, COPD w/ increased changes of lower lobe infrahilar bronchitis w/ infrahilar bronchiolar mucous plugging in the RLL.  No pulm edema was noted.  A new 6.1 mm LUL nodule was noted (rec 6 mo f/u).  He was admitted and placed on steroids w/ 1 dose of azithromycin and 1 dose of IV lasix this AM.  Breathing is currently stable @ rest, though he cont to intermittently desaturate into the 80's despite O2 @ 3lpm.  He denies any recent chest pain, palpitations, or presyncope.  Inpatient Medications     aspirin EC  81 mg Oral Daily   carvedilol  12.5 mg Oral BID   cholecalciferol  1,000 Units Oral Daily   enoxaparin (LOVENOX) injection  40 mg Subcutaneous Q24H   furosemide  20 mg Oral Daily   ipratropium-albuterol  3 mL Nebulization Q6H   methylPREDNISolone (SOLU-MEDROL) injection  40 mg Intravenous Q12H   Followed by   Derrill Memo ON 03/12/2021] predniSONE  40 mg Oral Q breakfast   sacubitril-valsartan  1 tablet Oral BID    Family History    Family History  Problem Relation Age of Onset   Heart disease Mother    Heart disease Father    Hyperlipidemia Father    Hypertension Father    Heart attack Brother 93       MI   Heart disease Sister        stents placed    Cancer - Cervical Sister     Heart disease Sister        CABG   He indicated that his mother is deceased. He indicated that his father is deceased. He indicated that two of his four sisters are alive. He indicated that his brother is deceased.   Social History    Social History   Socioeconomic History   Marital status: Married    Spouse name: Not on file   Number of children: Not on file   Years of education: Not on file   Highest education level: Not on file  Occupational History   Not on file  Tobacco Use   Smoking status: Former    Packs/day: 1.00    Years:  50.00    Pack years: 50.00    Types: Cigarettes    Quit date: 03/25/2013    Years since quitting: 7.9   Smokeless tobacco: Never  Vaping Use   Vaping Use: Never used  Substance and Sexual Activity   Alcohol use: No    Alcohol/week: 0.0 standard drinks   Drug use: No   Sexual activity: Yes  Other Topics Concern   Not on file  Social History Narrative   Arm '66-'68, [place], agent orange exposure.    Social Determinants of Health   Financial Resource Strain: Not on file  Food Insecurity: Not on file  Transportation Needs: Not on file  Physical Activity: Not on file  Stress: Not on file  Social Connections: Not on file  Intimate Partner Violence: Not on file     Review of Systems    General:  No chills, fever, night sweats or weight changes.  Cardiovascular:  No chest pain, +++ dyspnea on exertion, +++ edema, +++ orthopnea, no palpitations, +++ paroxysmal nocturnal dyspnea. Dermatological: No rash, lesions/masses Respiratory: +++ cough, +++ green sputum, +++ dyspnea Urologic: No hematuria, dysuria Abdominal:   No nausea, vomiting, diarrhea, bright red blood per rectum, melena, or hematemesis Neurologic:  No visual changes, wkns, changes in mental status. All other systems reviewed and are otherwise negative except as noted above.  Physical Exam    Blood pressure (!) 137/54, pulse 72, temperature (!) 97.5 F (36.4 C), temperature  source Oral, resp. rate 19, height 5\' 6"  (1.676 m), weight 74.8 kg, SpO2 92 %.  General: Pleasant, NAD Psych: Normal affect. Neuro: Alert and oriented X 3. Moves all extremities spontaneously. HEENT: Normal  Neck: Supple without bruits or JVD. Lungs:  Resp regular and unlabored, diminished breath sounds bilat. Heart: RRR, distant, 2/6 SEM throughout, no s3, s4, or murmurs. Abdomen: Soft, non-tender, non-distended, BS + x 4.  Extremities: No clubbing, cyanosis or edema. DP/PT2+, Radials 2+ and equal bilaterally.  Labs    Cardiac Enzymes Recent Labs  Lab 03/10/21 1257 03/10/21 1709  TROPONINIHS 12 12      Lab Results  Component Value Date   WBC 10.8 (H) 03/11/2021   HGB 14.2 03/11/2021   HCT 43.1 03/11/2021   MCV 88.1 03/11/2021   PLT 274 03/11/2021    Recent Labs  Lab 03/10/21 1254 03/11/21 0013  NA 131*  --   K 3.7  --   CL 93*  --   CO2 32  --   BUN 14  --   CREATININE 0.68 0.55*  CALCIUM 9.4  --   GLUCOSE 93  --    Lab Results  Component Value Date   CHOL 185 02/12/2020   HDL 53.50 02/12/2020   LDLCALC 106 (H) 02/12/2020   TRIG 128.0 02/12/2020    Radiology Studies    DG Chest 2 View  Result Date: 03/10/2021 CLINICAL DATA:  Chest pain and shortness of breath. EXAM: CHEST - 2 VIEW COMPARISON:  06/10/2015 and CT chest 03/01/2013. FINDINGS: Trachea is midline. Heart size normal. Thoracic aorta is calcified. Biapical pleuroparenchymal scarring, as on 03/01/2013. Additional scarring in the lingula. No airspace consolidation or pleural fluid. IMPRESSION: No acute findings. Electronically Signed   By: Lorin Picket M.D.   On: 03/10/2021 13:21   CT Angio Chest PE W and/or Wo Contrast  Result Date: 03/10/2021 CLINICAL DATA:  Shortness of breath and hypoxia. EXAM: CT ANGIOGRAPHY CHEST WITH CONTRAST TECHNIQUE: Multidetector CT imaging of the chest was performed using the standard  protocol during bolus administration of intravenous contrast. Multiplanar CT image  reconstructions and MIPs were obtained to evaluate the vascular anatomy. CONTRAST:  45mL OMNIPAQUE IOHEXOL 350 MG/ML SOLN COMPARISON:  Chest CT with IV contrast 03/01/2013, PA and lateral chest today, PA Lat chest 06/10/2015 FINDINGS: Cardiovascular: There is mild cardiomegaly with left chamber predominance. Once again the superior pulmonary veins are mildly distended but no more than previously. The pulmonary arteries are normal in caliber without evidence of thromboemboli or right heart strain. There is no pericardial effusion. There is moderate to heavy patchy three-vessel calcific CAD and moderate to heavy aortic calcific plaques extending into the great vessels. No great vessel or aortic aneurysm or stenosis is seen. The ascending aorta is ectatic at 3.7 cm caliber, the descending aorta ectatic up to 3.3 cm with slight aortic tortuosity. Scattered mixed plaque involves the descending aorta without visible ulcerative plaque or dissection. Mediastinum/Nodes: Stable subcarinal nodal complex 1.2 cm in short axis. Few stable slightly prominent hilar lymph nodes also noted with stable borderline prominent right paratracheal and precarinal nodes. No new or worsening adenopathy. Lungs/Pleura: There are mild centrilobular emphysematous changes and bilateral apical pleuroparenchymal scarring, with paraseptal emphysematous changes asymmetrically in the right apex. There is new demonstration of a 6.1 mm noncalcified nodule in the left upper lobe posteriorly on series 6 axial 38. Small chronic consolidation in the lingular base is unchanged as well as a few basilar scattered linear scar-like opacities. No pleural effusion or pneumothorax. There is interval increased bilateral infrahilar lower lobe bronchial thickening of bronchitis with bronchiolar mucous plugging on the right. Upper Abdomen: Scattered calcified granulomas both liver and spleen. Musculoskeletal: There is osteopenia and degenerative change of the thoracic  spine. Mild gynecomastia. Review of the MIP images confirms the above findings. IMPRESSION: 1. No evidence of arterial embolus or dilatation. 2. Aortic atherosclerosis.  Coronary artery atherosclerosis. 3. COPD with increased changes of lower lobe infrahilar bronchitis with infrahilar bronchiolar mucous plugging in the right lower lobe. 4. Mild cardiomegaly and mild chronic distention of the superior pulmonary veins. No pericardial or pleural effusion. No pulmonary edema is seen. 5. Biapical pleural-parenchymal scarring changes. 6. New 6.1 mm noncalcified left upper lobe nodule. Six-month follow-up chest CT recommended. 7. Stable borderline and slightly enlarged thoracic lymph nodes. 8. Mild subareolar gynecomastia. Electronically Signed   By: Telford Nab M.D.   On: 03/10/2021 22:48    ECG & Cardiac Imaging    Sinus arrhythmia, 62, prior anteroseptal infarct - personally reviewed.  Assessment & Plan    1.  Acute on chronic hypoxic resp failure/AECOPD: Patient with progressive dyspnea over the past 3 weeks associated with productive cough (white becoming green sputum), wheezing, and hypoxia with saturations in the 80s at home on December 12, prompting presentation.  CTA chest notable for COPD with increased changes of lower lobe infrahilar bronchitis and infrahilar bronchial mucous plugging in the right lower lobe.  BNP was also moderately elevated.  Received a dose of azithromycin on admission and dose of Lasix this morning.  He is also on steroids.  Breath sounds are diminished but no active wheezing.  Management per internal medicine.  2.  Acute on chronic combined syst/diast CHF/HFimpEF:  prior h/o LV dysfxn w/ EF prev as low as 25% in 2015 w/ subsequent recovery to 55-60% by echo in 06/2020.  As above, admitted w/ progressive dyspnea, lower ext swelling, and orthopnea, despite wt loss @ home.  BNP elevated on admission.  He was provided a dose of  IV lasix earlier today and has responded well.   Currently appears euvolemic on exam.  Cont oral Lasix, carvedilol, and Entresto.  Watch him closely while on steroids.  3.  Moderate Aortic Stenosis/moderate mitral regurgitation: Plan for outpatient echo within the next 3 months however in light of admission, echo has been ordered here.  4.  Essential HTN/Orthostatic hypotension: Hypotension has limited Entresto dose and resulted in discontinuation of spironolactone in the past.  Currently stable.  5.  HL: No recent lipids in our system.  He is statin intolerant.  6.  Left upper lobe nodule: New 6.1 mm noncalcified left upper lobe nodule noted on CT.  Radiology recommends 26-month follow-up.  Signed, Murray Hodgkins, NP 03/11/2021, 2:56 PM  For questions or updates, please contact   Please consult [email or web address] for contact info under Cardiology/STEMI.

## 2021-03-11 NOTE — Plan of Care (Signed)
°  Problem: Education: °Goal: Knowledge of General Education information will improve °Description: Including pain rating scale, medication(s)/side effects and non-pharmacologic comfort measures °Outcome: Progressing °  °Problem: Clinical Measurements: °Goal: Diagnostic test results will improve °Outcome: Progressing °  °Problem: Clinical Measurements: °Goal: Respiratory complications will improve °Outcome: Progressing °  °

## 2021-03-12 ENCOUNTER — Inpatient Hospital Stay (HOSPITAL_COMMUNITY)
Admit: 2021-03-12 | Discharge: 2021-03-12 | Disposition: A | Discharge status: Home / Self Care | Payer: Non-veteran care | Attending: Internal Medicine | Admitting: Internal Medicine

## 2021-03-12 DIAGNOSIS — I5030 Unspecified diastolic (congestive) heart failure: Secondary | ICD-10-CM

## 2021-03-12 DIAGNOSIS — I5031 Acute diastolic (congestive) heart failure: Secondary | ICD-10-CM

## 2021-03-12 DIAGNOSIS — J441 Chronic obstructive pulmonary disease with (acute) exacerbation: Principal | ICD-10-CM

## 2021-03-12 LAB — ECHOCARDIOGRAM COMPLETE
AR max vel: 0.65 cm2
AV Area VTI: 0.82 cm2
AV Area mean vel: 0.6 cm2
AV Mean grad: 27.8 mmHg
AV Peak grad: 42.5 mmHg
Ao pk vel: 3.26 m/s
Area-P 1/2: 2.8 cm2
Height: 66 in
MV VTI: 2.4 cm2
S' Lateral: 3.3 cm
Weight: 2638.47 oz

## 2021-03-12 LAB — BASIC METABOLIC PANEL
Anion gap: 7 (ref 5–15)
BUN: 30 mg/dL — ABNORMAL HIGH (ref 8–23)
CO2: 34 mmol/L — ABNORMAL HIGH (ref 22–32)
Calcium: 9.5 mg/dL (ref 8.9–10.3)
Chloride: 92 mmol/L — ABNORMAL LOW (ref 98–111)
Creatinine, Ser: 0.9 mg/dL (ref 0.61–1.24)
GFR, Estimated: >60 mL/min (ref >60)
Glucose, Bld: 123 mg/dL — ABNORMAL HIGH (ref 70–99)
Potassium: 4.1 mmol/L (ref 3.5–5.1)
Sodium: 133 mmol/L — ABNORMAL LOW (ref 135–145)

## 2021-03-12 LAB — CBC
HCT: 43.3 % (ref 39.0–52.0)
Hemoglobin: 14.6 g/dL (ref 13.0–17.0)
MCH: 29.7 pg (ref 26.0–34.0)
MCHC: 33.7 g/dL (ref 30.0–36.0)
MCV: 88.2 fL (ref 80.0–100.0)
Platelets: 292 10*3/uL (ref 150–400)
RBC: 4.91 MIL/uL (ref 4.22–5.81)
RDW: 14.6 % (ref 11.5–15.5)
WBC: 15.1 10*3/uL — ABNORMAL HIGH (ref 4.0–10.5)
nRBC: 0 % (ref 0.0–0.2)

## 2021-03-12 LAB — MAGNESIUM: Magnesium: 2.4 mg/dL (ref 1.7–2.4)

## 2021-03-12 NOTE — Progress Notes (Signed)
*  PRELIMINARY RESULTS* Echocardiogram 2D Echocardiogram has been performed.  Curtis Campos 03/12/2021, 8:35 AM

## 2021-03-12 NOTE — Progress Notes (Signed)
PROGRESS NOTE    Curtis Campos  AJO:[id number] DOB: 1940-12-25 DOA: 03/10/2021 PCP: [name], NP    Brief Narrative:  Curtis Campos is a 80 y.o. male with past medical history of systolic congestive heart failure with improved ejection fraction with last LV ejection fraction of 55 to 60% in April 2022, history of coronary artery disease, moderate aortic stenosis, history of rectal cancer, COPD with history of previous oxygen use though not on oxygen currently presented to hospital with 2 weeks history of wheezing and shortness of breath with low pulse ox in the 80s at home.  Patient had seen his cardiologist on 02/11/21 and follow-up echocardiogram was scheduled at that time.  In the ED patient was noted to be tachypneic with respiratory rate at 22 with pulse ox of 84% on room air.  Troponin borderline.  There was note of mild hyponatremia with sodium of 131.  COVID and flu was negative.  EKG showed normal sinus rhythm.  CTA chest PE protocol showed no evidence of PE but COPD with increased changes of bronchitis.  In the ED patient was given duo nebs Solu-Medrol azithromycin and was admitted hospital for further evaluation and treatment   Assessment & Plan:   Principal Problem:   COPD with acute exacerbation ([place]) Active Problems:   Essential hypertension   Acute respiratory failure with hypoxia (HCC)   Coronary artery disease   Chronic systolic heart failure (HCC)   Moderate aortic stenosis  Acute hypoxemic respiratory failure which is likely multifactorial.  Suspect CHF exacerbation with history of chronic combined heart failure.  Initially treated for COPD exacerbation.  Presented with orthopnea.  Reportedly mildly volume overloaded on presentation.  BNP elevated.  Echocardiogram pending. Currently on carvedilol and Entresto.  Also has history of aortic stenosis. Treated with IV diuresis and currently remains on oral Lasix.  Followed by cardiology.  COPD with acute  exacerbation: Suspected present on admission.  Less likely with no evidence of active wheezing.  Discontinue steroids to avoid fluid retention.  Continue aggressive bronchodilator therapies. Keep on oxygen to keep saturations more than 90%.  Do home oxygen evaluation.  Essential hypertension: Blood pressures fairly stable today.  On Entresto and Coreg.  Coronary artery disease: No acute issues.  Troponins negative.  EKG nonacute.  Patient on aspirin, Coreg and Crestor.  Abnormal CT scan of the chest: Radiology recommended 103-month follow-up.  DVT prophylaxis: enoxaparin (LOVENOX) injection 40 mg Start: 03/11/21 0800   Code Status: Full code Family Communication: None at the bedside Disposition Plan: Status is: Inpatient  Remains inpatient appropriate because: Still on significant oxygen         Consultants:  Cardiology  Procedures:  None  Antimicrobials:  None   Subjective: Patient seen and examined in the morning rounds.  His breathing is slightly better than yesterday.  He was mobilized in the hallway and oxygen saturation dropped to 84%.  He ultimately needed more than 4 L of oxygen.  Does not use oxygen at home. Denies any chest pain or palpitations.  His orthopnea is improving.  Objective: Vitals:   03/12/21 1000 03/12/21 1158 03/12/21 1200 03/12/21 1207  BP:  121/64    Pulse:  68    Resp:  16    Temp:  (!) 97.4 F (36.3 C)    TempSrc:  Oral    SpO2: (!) 88% (!) 88% (!) 84% (!) 88%  Weight:      Height:        Intake/Output  Summary (Last 24 hours) at 03/12/2021 1231 Last data filed at 03/11/2021 1700 Gross per 24 hour  Intake 240 ml  Output --  Net 240 ml   Filed Weights   03/10/21 1247 03/11/21 0153  Weight: 77.1 kg 74.8 kg    Examination:  General exam: Appears calm and comfortable.  Not in any distress. Respiratory system: Clear to auscultation. Respiratory effort normal.  No added sounds. SpO2: (!) 88 % O2 Flow Rate (L/min): 4.5 L/min   Cardiovascular system: S1 & S2 heard, RRR. No JVD, murmurs, rubs, gallops or clicks. No pedal edema. Gastrointestinal system: Abdomen is nondistended, soft and nontender. No organomegaly or masses felt. Normal bowel sounds heard. Central nervous system: Alert and oriented. No focal neurological deficits. Extremities: Symmetric 5 x 5 power. Skin: No rashes, lesions or ulcers Psychiatry: Judgement and insight appear normal. Mood & affect appropriate.     Data Reviewed: I have personally reviewed following labs and imaging studies  CBC: Recent Labs  Lab 03/10/21 1254 03/11/21 0013 03/12/21 0541  WBC 9.9 10.8* 15.1*  HGB 14.0 14.2 14.6  HCT 42.8 43.1 43.3  MCV 89.9 88.1 88.2  PLT 269 274 161   Basic Metabolic Panel: Recent Labs  Lab 03/10/21 1254 03/11/21 0013 03/12/21 0541  NA 131*  --  133*  K 3.7  --  4.1  CL 93*  --  92*  CO2 32  --  34*  GLUCOSE 93  --  123*  BUN 14  --  30*  CREATININE 0.68 0.55* 0.90  CALCIUM 9.4  --  9.5  MG  --   --  2.4   GFR: Estimated Creatinine Clearance: 59.1 mL/min (by C-G formula based on SCr of 0.9 mg/dL). Liver Function Tests: No results for input(s): AST, ALT, ALKPHOS, BILITOT, PROT, ALBUMIN in the last 168 hours. No results for input(s): LIPASE, AMYLASE in the last 168 hours. No results for input(s): AMMONIA in the last 168 hours. Coagulation Profile: No results for input(s): INR, PROTIME in the last 168 hours. Cardiac Enzymes: No results for input(s): CKTOTAL, CKMB, CKMBINDEX, TROPONINI in the last 168 hours. BNP (last 3 results) No results for input(s): PROBNP in the last 8760 hours. HbA1C: No results for input(s): HGBA1C in the last 72 hours. CBG: No results for input(s): GLUCAP in the last 168 hours. Lipid Profile: No results for input(s): CHOL, HDL, LDLCALC, TRIG, CHOLHDL, LDLDIRECT in the last 72 hours. Thyroid Function Tests: No results for input(s): TSH, T4TOTAL, FREET4, T3FREE, THYROIDAB in the last 72  hours. Anemia Panel: No results for input(s): VITAMINB12, FOLATE, FERRITIN, TIBC, IRON, RETICCTPCT in the last 72 hours. Sepsis Labs: No results for input(s): PROCALCITON, LATICACIDVEN in the last 168 hours.  Recent Results (from the past 240 hour(s))  Resp Panel by RT-PCR (Flu A&B, Covid) Nasopharyngeal Swab     Status: None   Collection Time: 03/10/21 12:57 PM   Specimen: Nasopharyngeal Swab; Nasopharyngeal(NP) swabs in vial transport medium  Result Value Ref Range Status   SARS Coronavirus 2 by RT PCR NEGATIVE NEGATIVE Final    Comment: (NOTE) SARS-CoV-2 target nucleic acids are NOT DETECTED.  The SARS-CoV-2 RNA is generally detectable in upper respiratory specimens during the acute phase of infection. The lowest concentration of SARS-CoV-2 viral copies this assay can detect is 138 copies/mL. A negative result does not preclude SARS-Cov-2 infection and should not be used as the sole basis for treatment or other patient management decisions. A negative result may occur with  improper specimen  collection/handling, submission of specimen other than nasopharyngeal swab, presence of viral mutation(s) within the areas targeted by this assay, and inadequate number of viral copies(<138 copies/mL). A negative result must be combined with clinical observations, patient history, and epidemiological information. The expected result is Negative.  Fact Sheet for Patients:  EntrepreneurPulse.com.au  Fact Sheet for Healthcare Providers:  IncredibleEmployment.be  This test is no t yet approved or cleared by the [place] FDA and  has been authorized for detection and/or diagnosis of SARS-CoV-2 by FDA under an Emergency Use Authorization (EUA). This EUA will remain  in effect (meaning this test can be used) for the duration of the COVID-19 declaration under Section 564(b)(1) of the Act, 21 U.S.C.section 360bbb-3(b)(1), unless the authorization is  terminated  or revoked sooner.       Influenza A by PCR NEGATIVE NEGATIVE Final   Influenza B by PCR NEGATIVE NEGATIVE Final    Comment: (NOTE) The Xpert Xpress SARS-CoV-2/FLU/RSV plus assay is intended as an aid in the diagnosis of influenza from Nasopharyngeal swab specimens and should not be used as a sole basis for treatment. Nasal washings and aspirates are unacceptable for Xpert Xpress SARS-CoV-2/FLU/RSV testing.  Fact Sheet for Patients: EntrepreneurPulse.com.au  Fact Sheet for Healthcare Providers: IncredibleEmployment.be  This test is not yet approved or cleared by the [place] FDA and has been authorized for detection and/or diagnosis of SARS-CoV-2 by FDA under an Emergency Use Authorization (EUA). This EUA will remain in effect (meaning this test can be used) for the duration of the COVID-19 declaration under Section 564(b)(1) of the Act, 21 U.S.C. section 360bbb-3(b)(1), unless the authorization is terminated or revoked.  Performed at [facility], [address], [place], [place] [address]          Radiology Studies: DG Chest 2 View  Result Date: 03/10/2021 CLINICAL DATA:  Chest pain and shortness of breath. EXAM: CHEST - 2 VIEW COMPARISON:  06/10/2015 and CT chest 03/01/2013. FINDINGS: Trachea is midline. Heart size normal. Thoracic aorta is calcified. Biapical pleuroparenchymal scarring, as on 03/01/2013. Additional scarring in the lingula. No airspace consolidation or pleural fluid. IMPRESSION: No acute findings. Electronically Signed   By: Lorin Picket M.D.   On: 03/10/2021 13:21   CT Angio Chest PE W and/or Wo Contrast  Result Date: 03/10/2021 CLINICAL DATA:  Shortness of breath and hypoxia. EXAM: CT ANGIOGRAPHY CHEST WITH CONTRAST TECHNIQUE: Multidetector CT imaging of the chest was performed using the standard protocol during bolus administration of intravenous contrast. Multiplanar CT image  reconstructions and MIPs were obtained to evaluate the vascular anatomy. CONTRAST:  65mL OMNIPAQUE IOHEXOL 350 MG/ML SOLN COMPARISON:  Chest CT with IV contrast 03/01/2013, PA and lateral chest today, PA Lat chest 06/10/2015 FINDINGS: Cardiovascular: There is mild cardiomegaly with left chamber predominance. Once again the superior pulmonary veins are mildly distended but no more than previously. The pulmonary arteries are normal in caliber without evidence of thromboemboli or right heart strain. There is no pericardial effusion. There is moderate to heavy patchy three-vessel calcific CAD and moderate to heavy aortic calcific plaques extending into the great vessels. No great vessel or aortic aneurysm or stenosis is seen. The ascending aorta is ectatic at 3.7 cm caliber, the descending aorta ectatic up to 3.3 cm with slight aortic tortuosity. Scattered mixed plaque involves the descending aorta without visible ulcerative plaque or dissection. Mediastinum/Nodes: Stable subcarinal nodal complex 1.2 cm in short axis. Few stable slightly prominent hilar lymph nodes also noted with stable borderline prominent  right paratracheal and precarinal nodes. No new or worsening adenopathy. Lungs/Pleura: There are mild centrilobular emphysematous changes and bilateral apical pleuroparenchymal scarring, with paraseptal emphysematous changes asymmetrically in the right apex. There is new demonstration of a 6.1 mm noncalcified nodule in the left upper lobe posteriorly on series 6 axial 38. Small chronic consolidation in the lingular base is unchanged as well as a few basilar scattered linear scar-like opacities. No pleural effusion or pneumothorax. There is interval increased bilateral infrahilar lower lobe bronchial thickening of bronchitis with bronchiolar mucous plugging on the right. Upper Abdomen: Scattered calcified granulomas both liver and spleen. Musculoskeletal: There is osteopenia and degenerative change of the thoracic  spine. Mild gynecomastia. Review of the MIP images confirms the above findings. IMPRESSION: 1. No evidence of arterial embolus or dilatation. 2. Aortic atherosclerosis.  Coronary artery atherosclerosis. 3. COPD with increased changes of lower lobe infrahilar bronchitis with infrahilar bronchiolar mucous plugging in the right lower lobe. 4. Mild cardiomegaly and mild chronic distention of the superior pulmonary veins. No pericardial or pleural effusion. No pulmonary edema is seen. 5. Biapical pleural-parenchymal scarring changes. 6. New 6.1 mm noncalcified left upper lobe nodule. Six-month follow-up chest CT recommended. 7. Stable borderline and slightly enlarged thoracic lymph nodes. 8. Mild subareolar gynecomastia. Electronically Signed   By: Telford Nab M.D.   On: 03/10/2021 22:48        Scheduled Meds:  aspirin EC  81 mg Oral Daily   carvedilol  12.5 mg Oral BID   cholecalciferol  1,000 Units Oral Daily   enoxaparin (LOVENOX) injection  40 mg Subcutaneous Q24H   furosemide  20 mg Oral Daily   ipratropium-albuterol  3 mL Nebulization Q6H   sacubitril-valsartan  1 tablet Oral BID   Continuous Infusions:   LOS: 2 days    Time spent: 30 minutes    Barb Merino, MD [facility] Pager [phone]

## 2021-03-12 NOTE — Progress Notes (Signed)
Progress Note  Patient Name: Curtis Campos Date of Encounter: 03/12/2021  Primary Cardiologist: Kathlyn Sacramento, MD  Subjective   Feels that breathing has improved slightly.  Still w/ productive cough.  Inpatient Medications    Scheduled Meds:  aspirin EC  81 mg Oral Daily   carvedilol  12.5 mg Oral BID   cholecalciferol  1,000 Units Oral Daily   enoxaparin (LOVENOX) injection  40 mg Subcutaneous Q24H   furosemide  20 mg Oral Daily   ipratropium-albuterol  3 mL Nebulization Q6H   sacubitril-valsartan  1 tablet Oral BID   Continuous Infusions:  PRN Meds: acetaminophen **OR** acetaminophen, albuterol, ondansetron **OR** ondansetron (ZOFRAN) IV   Vital Signs    Vitals:   03/12/21 1000 03/12/21 1158 03/12/21 1200 03/12/21 1207  BP:  121/64    Pulse:  68    Resp:  16    Temp:  (!) 97.4 F (36.3 C)    TempSrc:  Oral    SpO2: (!) 88% (!) 88% (!) 84% (!) 88%  Weight:      Height:        Intake/Output Summary (Last 24 hours) at 03/12/2021 1248 Last data filed at 03/11/2021 1700 Gross per 24 hour  Intake 240 ml  Output --  Net 240 ml   Filed Weights   03/10/21 1247 03/11/21 0153  Weight: 77.1 kg 74.8 kg    Physical Exam   GEN: Well nourished, well developed, in no acute distress.  HEENT: Grossly normal.  Neck: Supple, no JVD, carotid bruits, or masses. Cardiac: RRR, 2/6 syst murmur throughout, no rubs or gallops. No clubbing, cyanosis, edema.  Radials 2+, DP/PT 2+ and equal bilaterally.  Respiratory:  Respirations regular and unlabored, diminished breath sounds bilat. GI: Soft, nontender, nondistended, BS + x 4. MS: no deformity or atrophy. Skin: warm and dry, no rash. Neuro:  Strength and sensation are intact. Psych: AAOx3.  Normal affect.  Labs    Chemistry Recent Labs  Lab 03/10/21 1254 03/11/21 0013 03/12/21 0541  NA 131*  --  133*  K 3.7  --  4.1  CL 93*  --  92*  CO2 32  --  34*  GLUCOSE 93  --  123*  BUN 14  --  30*  CREATININE 0.68  0.55* 0.90  CALCIUM 9.4  --  9.5  GFRNONAA >60 >60 >60  ANIONGAP 6  --  7     Hematology Recent Labs  Lab 03/10/21 1254 03/11/21 0013 03/12/21 0541  WBC 9.9 10.8* 15.1*  RBC 4.76 4.89 4.91  HGB 14.0 14.2 14.6  HCT 42.8 43.1 43.3  MCV 89.9 88.1 88.2  MCH 29.4 29.0 29.7  MCHC 32.7 32.9 33.7  RDW 14.6 14.5 14.6  PLT 269 274 292    Cardiac Enzymes  Recent Labs  Lab 03/10/21 1257 03/10/21 1709  TROPONINIHS 12 12      BNP Recent Labs  Lab 03/11/21 0013  BNP 282.5*      Lipids  Lab Results  Component Value Date   CHOL 185 02/12/2020   HDL 53.50 02/12/2020   LDLCALC 106 (H) 02/12/2020   LDLDIRECT 129.7 04/14/2012   TRIG 128.0 02/12/2020   CHOLHDL 3 02/12/2020    HbA1c  Lab Results  Component Value Date   HGBA1C 6.3 02/12/2020    Radiology    DG Chest 2 View  Result Date: 03/10/2021 CLINICAL DATA:  Chest pain and shortness of breath. EXAM: CHEST - 2 VIEW COMPARISON:  06/10/2015 and  CT chest 03/01/2013. FINDINGS: Trachea is midline. Heart size normal. Thoracic aorta is calcified. Biapical pleuroparenchymal scarring, as on 03/01/2013. Additional scarring in the lingula. No airspace consolidation or pleural fluid. IMPRESSION: No acute findings. Electronically Signed   By: Lorin Picket M.D.   On: 03/10/2021 13:21   CT Angio Chest PE W and/or Wo Contrast  Result Date: 03/10/2021 CLINICAL DATA:  Shortness of breath and hypoxia. EXAM: CT ANGIOGRAPHY CHEST WITH CONTRAST TECHNIQUE: Multidetector CT imaging of the chest was performed using the standard protocol during bolus administration of intravenous contrast. Multiplanar CT image reconstructions and MIPs were obtained to evaluate the vascular anatomy. CONTRAST:  63mL OMNIPAQUE IOHEXOL 350 MG/ML SOLN COMPARISON:  Chest CT with IV contrast 03/01/2013, PA and lateral chest today, PA Lat chest 06/10/2015 FINDINGS: Cardiovascular: There is mild cardiomegaly with left chamber predominance. Once again the superior  pulmonary veins are mildly distended but no more than previously. The pulmonary arteries are normal in caliber without evidence of thromboemboli or right heart strain. There is no pericardial effusion. There is moderate to heavy patchy three-vessel calcific CAD and moderate to heavy aortic calcific plaques extending into the great vessels. No great vessel or aortic aneurysm or stenosis is seen. The ascending aorta is ectatic at 3.7 cm caliber, the descending aorta ectatic up to 3.3 cm with slight aortic tortuosity. Scattered mixed plaque involves the descending aorta without visible ulcerative plaque or dissection. Mediastinum/Nodes: Stable subcarinal nodal complex 1.2 cm in short axis. Few stable slightly prominent hilar lymph nodes also noted with stable borderline prominent right paratracheal and precarinal nodes. No new or worsening adenopathy. Lungs/Pleura: There are mild centrilobular emphysematous changes and bilateral apical pleuroparenchymal scarring, with paraseptal emphysematous changes asymmetrically in the right apex. There is new demonstration of a 6.1 mm noncalcified nodule in the left upper lobe posteriorly on series 6 axial 38. Small chronic consolidation in the lingular base is unchanged as well as a few basilar scattered linear scar-like opacities. No pleural effusion or pneumothorax. There is interval increased bilateral infrahilar lower lobe bronchial thickening of bronchitis with bronchiolar mucous plugging on the right. Upper Abdomen: Scattered calcified granulomas both liver and spleen. Musculoskeletal: There is osteopenia and degenerative change of the thoracic spine. Mild gynecomastia. Review of the MIP images confirms the above findings. IMPRESSION: 1. No evidence of arterial embolus or dilatation. 2. Aortic atherosclerosis.  Coronary artery atherosclerosis. 3. COPD with increased changes of lower lobe infrahilar bronchitis with infrahilar bronchiolar mucous plugging in the right lower  lobe. 4. Mild cardiomegaly and mild chronic distention of the superior pulmonary veins. No pericardial or pleural effusion. No pulmonary edema is seen. 5. Biapical pleural-parenchymal scarring changes. 6. New 6.1 mm noncalcified left upper lobe nodule. Six-month follow-up chest CT recommended. 7. Stable borderline and slightly enlarged thoracic lymph nodes. 8. Mild subareolar gynecomastia. Electronically Signed   By: Telford Nab M.D.   On: 03/10/2021 22:48    Telemetry    Not on tele  Cardiac Studies   2D Echocardiogram 4.2.2022  1. Left ventricular ejection fraction, by estimation, is 55 to 60%. The  left ventricle has normal function. Left ventricular endocardial border  not optimally defined to evaluate regional wall motion. Left ventricular  diastolic parameters are  indeterminate.   2. Right ventricular systolic function is normal. The right ventricular  size is normal. There is normal pulmonary artery systolic pressure.   3. The mitral valve is grossly normal. Trivial mitral valve  regurgitation. No evidence of mitral stenosis.  4. The aortic valve was not well visualized. There is moderate  calcification of the aortic valve. There is moderate thickening of the  aortic valve. Aortic valve regurgitation is not visualized. Moderate  aortic valve stenosis. Aortic valve area, by VTI  measures 1.25 cm. Aortic valve mean gradient measures 22.0 mmHg.   5. The inferior vena cava is normal in size with greater than 50%  respiratory variability, suggesting right atrial pressure of 3 mmHg.  _____________   2D Echocardiogram - pending this admission. _____________   Patient Profile     80 y.o. male with a history of CAD, ischemic cardiomyopathy, HFrEF, moderate aortic stenosis, moderate mitral regurgitation, hypertension, hyperlipidemia with statin intolerance, remote tobacco abuse, and COPD, who was admitted 03/10/2021 w/ progressive dyspnea, COPD, and mild volume  overload.  Assessment & Plan    1.   Acute on chronic hypoxic resp failure/AECOPD: Patient with progressive dyspnea over the past 3 weeks associated with productive cough (white becoming green sputum), wheezing, and hypoxia with saturations in the 80s at home on December 12, prompting presentation.  CTA chest notable for COPD with increased changes of lower lobe infrahilar bronchitis and infrahilar bronchial mucous plugging in the right lower lobe.  BNP was also moderately elevated and he received IV azithromycin and lasix on 12/13.  Feels that breathing has improved, but cont to require supplemental O2.  Diminished breath sounds w/o wheezing crackles on exam. Euvolemic.  Now off of steroids.  Nebs per IM.  2.  Acute on chronic combined syst/diast CHF/HFimpEF:  prior h/o LV dysfxn w/ EF prev as low as 25% in 2015 w/ subsequent recovery to 55-60% by echo in 06/2020.  As above, admitted w/ progressive dyspnea, lower ext swelling, and orthopnea, despite wt loss @ home.  BNP elevated on admission.  He was provided a dose of IV lasix on 12/13 and has otw been on prior home dose.  Minus 359 ml yesterday.  Recorded wt down from prior office based wts.  Euvolemic on exam.  Cont ? blocker, entresto, and PO lasix.  Echo pending.  3.  Moderate Aortic stenosis/moderate mitral regurgitation:  Echo pending.  4.  Essential HTN/Orthostatic hypotension:  stable on current meds.  5.  HL:  statin intolerant.  6.  LUL nodule:  new 6.79mm nonCa2+ LUL nodule on CT this admission.  Needs 6 mos f/u imaging.  Signed, Murray Hodgkins, NP  03/12/2021, 12:48 PM    For questions or updates, please contact   Please consult [email or web address] for contact info under Cardiology/STEMI.

## 2021-03-12 NOTE — Consult Note (Addendum)
°  Heart Failure Nurse Navigator Note  HfpEF 55 to 60%.  Normal right ventricular systolic function.  Moderate aortic stenosis.   Echocardiogram performed on this admission results are pending.  He presented from home with a 2-week history of increasing shortness of breath and noted his pulse ox running in the 80s.   Comorbidities:  Coronary artery disease Moderate aortic stenosis Rectal Cancer COPD  Medications:  Aspirin 81 mg daily Coreg 12.5 mg 2 times a day Furosemide 20 mg tablet daily Entresto 24/26 mg 2 times a day  Labs:  Sodium 131, potassium 4.1, chloride 92, CO2 34, BUN 30, creatinine 0.9.  BNP on admission was 285 Chest x-ray showed no acute finding.   Initial meeting with the patient who was sitting up in the chair at bedside and his daughter.  Discussed heart failure and what it means.  Also talked about how he takes care of himself at home.  He admits to not weighing daily.  Stressed the importance of weighing daily and reporting 2 to 3 pound weight gain overnight or 5 pounds within the week.  So discussed changes in symptoms to report.  He states at home that he had noted that he would sit down to eat and would take just a couple bites and was full, explained to him that is called early satiety.  Went over low-sodium diet, he states that they eat beef hotdogs at home and because his wife boils and then fries the hot dog," that removes all the salt from the meat."  Explained to him that that does not remove the salt/sodium from the meat.  Daughter states she has tried to educate them on low sodium foods, but that they were not willing to change, and continue to eat foods that are higher in sodium. Went over foods that are high in sodium and ones that you could eat.  Also discussed fluid restriction at eight 8 ounce cups daily.  He has a appointment in the outpatient heart failure clinic on December 30 at 1 PM.  He has a 0% of no-show rating.  He was given the  living with heart failure teaching booklet and info on low sodium.  Pricilla Riffle RN [facility]

## 2021-03-13 MED ORDER — TRAZODONE HCL 50 MG PO TABS
50.0000 mg | ORAL_TABLET | Freq: Every evening | ORAL | Status: DC | PRN
  Administered 2021-03-13 – 2021-03-14 (×2): 50 mg via ORAL
  Filled 2021-03-13 (×2): qty 1

## 2021-03-13 MED ORDER — GUAIFENESIN 100 MG/5ML PO LIQD: 10.0000 mL | ORAL | Status: DC | PRN

## 2021-03-13 NOTE — Progress Notes (Signed)
° °  See progress note from 03/12/2021. No new cardiac recommendations. Ongoing management per primary service. Follow up in the off ice 1-2 weeks after discharge.

## 2021-03-13 NOTE — Care Management Important Message (Signed)
Important Message  Patient Details  Name: Curtis Campos MRN: [id number] Date of Birth: Apr 10, 1940   Medicare Important Message Given:  Yes     Juliann Pulse A Alanie Syler 03/13/2021, 11:42 AM

## 2021-03-13 NOTE — Evaluation (Signed)
Physical Therapy Evaluation Patient Details Name: Curtis Campos MRN: [id number] DOB: 1940/10/17 Today's Date: 03/13/2021  History of Present Illness  Curtis Campos is a 80 y.o. male with medical history significant for Systolic CHF with improved EF (EF 55 to 60% April 2022), CAD, moderate aortic stenosis, HTN, history of rectal cancer, COPD with prior history of O2 use though not currently, who presents to the ED with a 2-week history of wheezing and shortness of breath with home pulse oximeter checks in the 80s.  He denies chest pain, fever or chills.   Clinical Impression  Patient received sitting up on side of bed. Agrees to PT assessment. Patient performed sit to stand with supervision/min guard. He is able to ambulate with RW 150 feet with min guard. O2 saturations dropped to 88% on 6 liters ambulating. Increased oxygen to 8 liters and he remained at 90% for remainder of walk. Patient will continue to benefit from skilled PT while here to improve activity tolerance and strength.        Recommendations for follow up therapy are one component of a multi-disciplinary discharge planning process, led by the attending physician.  Recommendations may be updated based on patient status, additional functional criteria and insurance authorization.  Follow Up Recommendations Home health PT    Assistance Recommended at Discharge Intermittent Supervision/Assistance  Functional Status Assessment Patient has had a recent decline in their functional status and demonstrates the ability to make significant improvements in function in a reasonable and predictable amount of time.  Equipment Recommendations  None recommended by PT    Recommendations for Other Services       Precautions / Restrictions Precautions Precaution Comments: mod fall Restrictions Weight Bearing Restrictions: No      Mobility  Bed Mobility               General bed mobility comments: patient received sitting up  on side of bed finishing breakfast    Transfers Overall transfer level: Needs assistance Equipment used: Rolling walker (2 wheels) Transfers: Sit to/from Stand Sit to Stand: Supervision                Ambulation/Gait Ambulation/Gait assistance: Min guard Gait Distance (Feet): 150 Feet Assistive device: Rolling walker (2 wheels) Gait Pattern/deviations: Step-through pattern;Decreased step length - right;Decreased step length - left Gait velocity: decr     General Gait Details: patient ambulates well with RW, min guard. O2 sats down to 88% on 6 liters O2 with ambulation. Increased Oxygen to 8 liters and saturations at 90%. Left patient in recliner on 7 liters with saturations at 92% at rest.  Stairs            Wheelchair Mobility    Modified Rankin (Stroke Patients Only)       Balance Overall balance assessment: Modified Independent                                           Pertinent Vitals/Pain Pain Assessment: No/denies pain    Home Living Family/patient expects to be discharged to:: Private residence Living Arrangements: Spouse/significant other Available Help at Discharge: Family;Available 24 hours/day Type of Home: House Home Access: Stairs to enter Entrance Stairs-Rails: Right;Can reach both Entrance Stairs-Number of Steps: 4   Home Layout: One level Home Equipment: Conservation officer, nature (2 wheels)      Prior Function Prior Level of Function : Independent/Modified  Independent;Driving             Mobility Comments: patient reports he is independent with rollator at baseline ADLs Comments: independent     Hand Dominance        Extremity/Trunk Assessment   Upper Extremity Assessment Upper Extremity Assessment: Overall WFL for tasks assessed    Lower Extremity Assessment Lower Extremity Assessment: Overall WFL for tasks assessed    Cervical / Trunk Assessment Cervical / Trunk Assessment: Normal  Communication    Communication: No difficulties  Cognition Arousal/Alertness: Awake/alert Behavior During Therapy: WFL for tasks assessed/performed Overall Cognitive Status: Within Functional Limits for tasks assessed                                          General Comments      Exercises     Assessment/Plan    PT Assessment Patient needs continued PT services  PT Problem List Decreased strength;Decreased mobility;Decreased activity tolerance;Cardiopulmonary status limiting activity       PT Treatment Interventions Therapeutic activities;Gait training;Therapeutic exercise;Stair training;Patient/family education;Functional mobility training    PT Goals (Current goals can be found in the Care Plan section)  Acute Rehab PT Goals Patient Stated Goal: to return home. PT Goal Formulation: With patient Time For Goal Achievement: 03/20/21 Potential to Achieve Goals: Good    Frequency Min 2X/week   Barriers to discharge        Co-evaluation               AM-PAC PT "6 Clicks" Mobility  Outcome Measure Help needed turning from your back to your side while in a flat bed without using bedrails?: None Help needed moving from lying on your back to sitting on the side of a flat bed without using bedrails?: None Help needed moving to and from a bed to a chair (including a wheelchair)?: A Little Help needed standing up from a chair using your arms (e.g., wheelchair or bedside chair)?: A Little Help needed to walk in hospital room?: A Little Help needed climbing 3-5 steps with a railing? : A Little 6 Click Score: 20    End of Session Equipment Utilized During Treatment: Gait belt;Oxygen Activity Tolerance: Patient tolerated treatment well Patient left: in chair;with call bell/phone within reach Nurse Communication: Mobility status PT Visit Diagnosis: Muscle weakness (generalized) (M62.81)    Time: 3546-5681 PT Time Calculation (min) (ACUTE ONLY): 16 min   Charges:   PT  Evaluation $PT Eval Moderate Complexity: 1 Mod          Dianne Bady, PT, GCS 03/13/21,11:18 AM

## 2021-03-13 NOTE — TOC Progression Note (Signed)
Transition of Care [facility]) - Progression Note    Patient Details  Name: Curtis Campos MRN: [id number] Date of Birth: [birth year]  Transition of Care [facility]) CM/SW [place], RN Phone Number: 03/13/2021, 1:29 PM  Clinical Narrative: Spoke with patient this  morning who is excited about going home, indicates that he may have to use oxygen at home, had it before unable to remember company. Understands that nurse will do assessment and if meets criteria I will assist with making arrangements. Patient says his Son Elberta Fortis will provide transport home.          Expected Discharge Plan and Services                                                 Social Determinants of Health (SDOH) Interventions    Readmission Risk Interventions No flowsheet data found.

## 2021-03-13 NOTE — Progress Notes (Signed)
PROGRESS NOTE    Curtis Campos  VHQ:[id number] DOB: [birth year] DOA: 03/10/2021 PCP: [name], NP    Brief Narrative:  Curtis Campos is a 80 y.o. male with past medical history of systolic congestive heart failure with improved ejection fraction with last LV ejection fraction of 55 to 60% in April 2022, history of coronary artery disease, moderate aortic stenosis, history of rectal cancer, COPD with history of previous oxygen use though not on oxygen currently presented to hospital with 2 weeks history of wheezing and shortness of breath with low pulse ox in the 80s at home.  Patient had seen his cardiologist on 02/11/21 and follow-up echocardiogram was scheduled at that time.  In the ED patient was noted to be tachypneic with respiratory rate at 22 with pulse ox of 84% on room air.  Troponin borderline.  There was note of mild hyponatremia with sodium of 131.  COVID and flu was negative.  EKG showed normal sinus rhythm.  CTA chest PE protocol showed no evidence of PE but COPD with increased changes of bronchitis.  In the ED patient was given duo nebs Solu-Medrol azithromycin and was admitted hospital for further evaluation and treatment   Assessment & Plan:   Principal Problem:   COPD with acute exacerbation ([place]) Active Problems:   Essential hypertension   Acute respiratory failure with hypoxia (HCC)   Coronary artery disease   Chronic systolic heart failure (HCC)   Moderate aortic stenosis   Heart failure with preserved ejection fraction (HCC)  Acute hypoxemic respiratory failure which is likely multifactorial.  Suspect CHF exacerbation with history of chronic combined heart failure.  Initially treated for COPD exacerbation.  Presented with orthopnea.  Reportedly mildly volume overloaded on presentation.  BNP elevated.  Echocardiogram with ejection fraction 55%. Currently on carvedilol and Entresto.  Also has history of aortic stenosis. Treated with IV diuresis and  currently remains on oral Lasix.   Euvolemic today.  Will monitor.  COPD with acute exacerbation: Suspected present on admission.  Patient with more cough and congestion today.  Steroid discontinued to avoid fluid retention.  Continue aggressive bronchodilator therapies.  Start aggressive chest physiotherapy and flutter valve today.  Mobilize. Keep on oxygen to keep saturations more than 90%.   CT angiogram of the chest was negative for pulmonary embolism.  It does show advanced COPD. We will plan to discharge home on oxygen when his oxygen requirement less than 6 L on mobility.  Essential hypertension: Blood pressures fairly stable today.  On Entresto and Coreg.  Coronary artery disease: No acute issues.  Troponins negative.  EKG nonacute.  Patient on aspirin, Coreg and Crestor.  Abnormal CT scan of the chest: Radiology recommended 41-month follow-up.  DVT prophylaxis: enoxaparin (LOVENOX) injection 40 mg Start: 03/11/21 0800   Code Status: Full code Family Communication: None at the bedside Disposition Plan: Status is: Inpatient  Remains inpatient appropriate because: Still on significant oxygen.  Patient needing more than 6 L of oxygen on mobility.  We will continue to provide more chest physiotherapy in the hospital.   Consultants:  Cardiology  Procedures:  None  Antimicrobials:  None   Subjective:  Patient seen and examined.  Orthopnea improved but now he has more cough and yellow sputum production.  At rest he feels better.  He was mobilized and oxygen dropped less than 88% on 6 L of oxygen.  On 6 L at rest, on 8 L on mobility.  Remains afebrile.  Objective: Vitals:  03/13/21 0549 03/13/21 0727 03/13/21 0736 03/13/21 1107  BP: 101/64 (!) 141/96    Pulse: 70 68    Resp: 16 16    Temp: (!) 97.5 F (36.4 C) (!) 97.4 F (36.3 C)    TempSrc:  Oral    SpO2: 90% 90% 90% 90%  Weight:      Height:        Intake/Output Summary (Last 24 hours) at 03/13/2021 1129 Last  data filed at 03/12/2021 1700 Gross per 24 hour  Intake 120 ml  Output --  Net 120 ml   Filed Weights   03/10/21 1247 03/11/21 0153  Weight: 77.1 kg 74.8 kg    Examination:  General exam: Appears calm and comfortable while resting. Respiratory system: Mostly clear with some upper airway conducted sounds. SpO2: 90 % O2 Flow Rate (L/min): 7 L/min  Cardiovascular system: S1 & S2 heard, RRR. No JVD, murmurs, rubs, gallops or clicks. No pedal edema. Gastrointestinal system: Abdomen is nondistended, soft and nontender. No organomegaly or masses felt. Normal bowel sounds heard. Central nervous system: Alert and oriented. No focal neurological deficits. Extremities: Symmetric 5 x 5 power. Skin: No rashes, lesions or ulcers Psychiatry: Judgement and insight appear normal. Mood & affect appropriate.     Data Reviewed: I have personally reviewed following labs and imaging studies  CBC: Recent Labs  Lab 03/10/21 1254 03/11/21 0013 03/12/21 0541  WBC 9.9 10.8* 15.1*  HGB 14.0 14.2 14.6  HCT 42.8 43.1 43.3  MCV 89.9 88.1 88.2  PLT 269 274 628   Basic Metabolic Panel: Recent Labs  Lab 03/10/21 1254 03/11/21 0013 03/12/21 0541  NA 131*  --  133*  K 3.7  --  4.1  CL 93*  --  92*  CO2 32  --  34*  GLUCOSE 93  --  123*  BUN 14  --  30*  CREATININE 0.68 0.55* 0.90  CALCIUM 9.4  --  9.5  MG  --   --  2.4   GFR: Estimated Creatinine Clearance: 59.1 mL/min (by C-G formula based on SCr of 0.9 mg/dL). Liver Function Tests: No results for input(s): AST, ALT, ALKPHOS, BILITOT, PROT, ALBUMIN in the last 168 hours. No results for input(s): LIPASE, AMYLASE in the last 168 hours. No results for input(s): AMMONIA in the last 168 hours. Coagulation Profile: No results for input(s): INR, PROTIME in the last 168 hours. Cardiac Enzymes: No results for input(s): CKTOTAL, CKMB, CKMBINDEX, TROPONINI in the last 168 hours. BNP (last 3 results) No results for input(s): PROBNP in the last  8760 hours. HbA1C: No results for input(s): HGBA1C in the last 72 hours. CBG: No results for input(s): GLUCAP in the last 168 hours. Lipid Profile: No results for input(s): CHOL, HDL, LDLCALC, TRIG, CHOLHDL, LDLDIRECT in the last 72 hours. Thyroid Function Tests: No results for input(s): TSH, T4TOTAL, FREET4, T3FREE, THYROIDAB in the last 72 hours. Anemia Panel: No results for input(s): VITAMINB12, FOLATE, FERRITIN, TIBC, IRON, RETICCTPCT in the last 72 hours. Sepsis Labs: No results for input(s): PROCALCITON, LATICACIDVEN in the last 168 hours.  Recent Results (from the past 240 hour(s))  Resp Panel by RT-PCR (Flu A&B, Covid) Nasopharyngeal Swab     Status: None   Collection Time: 03/10/21 12:57 PM   Specimen: Nasopharyngeal Swab; Nasopharyngeal(NP) swabs in vial transport medium  Result Value Ref Range Status   SARS Coronavirus 2 by RT PCR NEGATIVE NEGATIVE Final    Comment: (NOTE) SARS-CoV-2 target nucleic acids are NOT DETECTED.  The SARS-CoV-2 RNA is generally detectable in upper respiratory specimens during the acute phase of infection. The lowest concentration of SARS-CoV-2 viral copies this assay can detect is 138 copies/mL. A negative result does not preclude SARS-Cov-2 infection and should not be used as the sole basis for treatment or other patient management decisions. A negative result may occur with  improper specimen collection/handling, submission of specimen other than nasopharyngeal swab, presence of viral mutation(s) within the areas targeted by this assay, and inadequate number of viral copies(<138 copies/mL). A negative result must be combined with clinical observations, patient history, and epidemiological information. The expected result is Negative.  Fact Sheet for Patients:  EntrepreneurPulse.com.au  Fact Sheet for Healthcare Providers:  IncredibleEmployment.be  This test is no t yet approved or cleared by the  [place] FDA and  has been authorized for detection and/or diagnosis of SARS-CoV-2 by FDA under an Emergency Use Authorization (EUA). This EUA will remain  in effect (meaning this test can be used) for the duration of the COVID-19 declaration under Section 564(b)(1) of the Act, 21 U.S.C.section 360bbb-3(b)(1), unless the authorization is terminated  or revoked sooner.       Influenza A by PCR NEGATIVE NEGATIVE Final   Influenza B by PCR NEGATIVE NEGATIVE Final    Comment: (NOTE) The Xpert Xpress SARS-CoV-2/FLU/RSV plus assay is intended as an aid in the diagnosis of influenza from Nasopharyngeal swab specimens and should not be used as a sole basis for treatment. Nasal washings and aspirates are unacceptable for Xpert Xpress SARS-CoV-2/FLU/RSV testing.  Fact Sheet for Patients: EntrepreneurPulse.com.au  Fact Sheet for Healthcare Providers: IncredibleEmployment.be  This test is not yet approved or cleared by the [place] FDA and has been authorized for detection and/or diagnosis of SARS-CoV-2 by FDA under an Emergency Use Authorization (EUA). This EUA will remain in effect (meaning this test can be used) for the duration of the COVID-19 declaration under Section 564(b)(1) of the Act, 21 U.S.C. section 360bbb-3(b)(1), unless the authorization is terminated or revoked.  Performed at [facility], [address], [place], [place] [address]          Radiology Studies: ECHOCARDIOGRAM COMPLETE  Result Date: 03/12/2021    ECHOCARDIOGRAM REPORT   Patient Name:   TRINTEN BOUDOIN Date of Exam: 03/12/2021 Medical Rec #:  [id number]        Height:       66.0 in Accession #:    [id number]       Weight:       164.9 lb Date of Birth:  1940/08/19        BSA:          1.842 m Patient Age:    40 years         BP:           139/65 mmHg Patient Gender: M                HR:           71 bpm. Exam Location:  [facility] Procedure: 2D Echo,  Cardiac Doppler and Color Doppler Indications:     CHF-acute diastolic G81.15  History:         Patient has prior history of Echocardiogram examinations, most                  recent 07/23/2020. CAD, COPD; Risk Factors:Hypertension and                  Dyslipidemia.  Moderate Aortic stenosis.  Sonographer:     Sherrie Sport Referring Phys:  [id number] Athena Masse Diagnosing Phys: Kate Sable MD  Sonographer Comments: No parasternal window. Image acquisition challenging due to COPD. IMPRESSIONS  1. Left ventricular ejection fraction, by estimation, is 55%. The left ventricle has normal function. The left ventricle has no regional wall motion abnormalities. There is mild left ventricular hypertrophy. Left ventricular diastolic parameters are consistent with Grade II diastolic dysfunction (pseudonormalization).  2. Right ventricular systolic function is normal. The right ventricular size is normal.  3. The mitral valve is normal in structure. Mild mitral valve regurgitation.  4. Aortic valve peak gradient 50, mean gradient 34, DVI 0.23, Vmax 3.60m/s, AVA 0.75cm2. The aortic valve is calcified. Aortic valve regurgitation is not visualized. Moderate to severe aortic valve stenosis.  5. The inferior vena cava is normal in size with greater than 50% respiratory variability, suggesting right atrial pressure of 3 mmHg. FINDINGS  Left Ventricle: Left ventricular ejection fraction, by estimation, is 55%. The left ventricle has normal function. The left ventricle has no regional wall motion abnormalities. The left ventricular internal cavity size was normal in size. There is mild left ventricular hypertrophy. Left ventricular diastolic parameters are consistent with Grade II diastolic dysfunction (pseudonormalization). Right Ventricle: The right ventricular size is normal. No increase in right ventricular wall thickness. Right ventricular systolic function is normal. Left Atrium: Left atrial size was normal in size. Right  Atrium: Right atrial size was normal in size. Pericardium: There is no evidence of pericardial effusion. Mitral Valve: The mitral valve is normal in structure. Mild mitral valve regurgitation. MV peak gradient, 4.8 mmHg. The mean mitral valve gradient is 3.0 mmHg. Tricuspid Valve: The tricuspid valve is normal in structure. Tricuspid valve regurgitation is mild. Aortic Valve: Aortic valve peak gradient 50, mean gradient 34, DVI 0.23, Vmax 3.35m/s, AVA 0.75cm2. The aortic valve is calcified. Aortic valve regurgitation is not visualized. Moderate to severe aortic stenosis is present. Aortic valve mean gradient measures 27.8 mmHg. Aortic valve peak gradient measures 42.5 mmHg. Aortic valve area, by VTI measures 0.82 cm. Pulmonic Valve: The pulmonic valve was not well visualized. Pulmonic valve regurgitation is not visualized. Aorta: The aortic root is normal in size and structure. Venous: The inferior vena cava is normal in size with greater than 50% respiratory variability, suggesting right atrial pressure of 3 mmHg. IAS/Shunts: No atrial level shunt detected by color flow Doppler.  LEFT VENTRICLE PLAX 2D LVIDd:         4.70 cm   Diastology LVIDs:         3.30 cm   LV e' medial:    4.03 cm/s LV PW:         1.20 cm   LV E/e' medial:  20.1 LV IVS:        1.10 cm   LV e' lateral:   5.55 cm/s LVOT diam:     2.00 cm   LV E/e' lateral: 14.6 LV SV:         68 LV SV Index:   37 LVOT Area:     3.14 cm  RIGHT VENTRICLE RV S prime:     12.00 cm/s TAPSE (M-mode): 3.5 cm LEFT ATRIUM           Index        RIGHT ATRIUM           Index LA diam:      3.50 cm 1.90 cm/m   RA Area:  14.50 cm LA Vol (A4C): 28.6 ml 15.52 ml/m  RA Volume:   35.30 ml  19.16 ml/m  AORTIC VALVE                     PULMONIC VALVE AV Area (Vmax):    0.65 cm      PV Vmax:        0.95 m/s AV Area (Vmean):   0.60 cm      PV Vmean:       70.900 cm/s AV Area (VTI):     0.82 cm      PV VTI:         0.259 m AV Vmax:           326.00 cm/s   PV Peak grad:    3.6 mmHg AV Vmean:          252.200 cm/s  PV Mean grad:   2.0 mmHg AV VTI:            0.831 m       RVOT Peak grad: 4 mmHg AV Peak Grad:      42.5 mmHg AV Mean Grad:      27.8 mmHg LVOT Vmax:         67.20 cm/s LVOT Vmean:        48.500 cm/s LVOT VTI:          0.218 m LVOT/AV VTI ratio: 0.26 MITRAL VALVE                TRICUSPID VALVE MV Area (PHT): 2.80 cm     TR Peak grad:   37.9 mmHg MV Area VTI:   2.40 cm     TR Vmax:        308.00 cm/s MV Peak grad:  4.8 mmHg MV Mean grad:  3.0 mmHg     SHUNTS MV Vmax:       1.09 m/s     Systemic VTI:  0.22 m MV Vmean:      77.2 cm/s    Systemic Diam: 2.00 cm MV Decel Time: 271 msec     Pulmonic VTI:  0.219 m MV E velocity: 81.00 cm/s MV A velocity: 109.00 cm/s MV E/A ratio:  0.74 Kate Sable MD Electronically signed by Kate Sable MD Signature Date/Time: 03/12/2021/2:47:42 PM    Final         Scheduled Meds:  aspirin EC  81 mg Oral Daily   carvedilol  12.5 mg Oral BID   cholecalciferol  1,000 Units Oral Daily   enoxaparin (LOVENOX) injection  40 mg Subcutaneous Q24H   furosemide  20 mg Oral Daily   ipratropium-albuterol  3 mL Nebulization Q6H   sacubitril-valsartan  1 tablet Oral BID   Continuous Infusions:   LOS: 3 days    Time spent: 30 minutes    Barb Merino, MD [facility] Pager [phone]

## 2021-03-14 ENCOUNTER — Inpatient Hospital Stay: Payer: Non-veteran care

## 2021-03-14 LAB — CBC WITH DIFFERENTIAL/PLATELET
Abs Immature Granulocytes: 0.06 10*3/uL (ref 0.00–0.07)
Basophils Absolute: 0 10*3/uL (ref 0.0–0.1)
Basophils Relative: 0 %
Eosinophils Absolute: 0.1 10*3/uL (ref 0.0–0.5)
Eosinophils Relative: 1 %
HCT: 42.8 % (ref 39.0–52.0)
Hemoglobin: 14.5 g/dL (ref 13.0–17.0)
Immature Granulocytes: 0 %
Lymphocytes Relative: 7 %
Lymphs Abs: 1 10*3/uL (ref 0.7–4.0)
MCH: 29.7 pg (ref 26.0–34.0)
MCHC: 33.9 g/dL (ref 30.0–36.0)
MCV: 87.7 fL (ref 80.0–100.0)
Monocytes Absolute: 2.2 10*3/uL — ABNORMAL HIGH (ref 0.1–1.0)
Monocytes Relative: 14 %
Neutro Abs: 11.9 10*3/uL — ABNORMAL HIGH (ref 1.7–7.7)
Neutrophils Relative %: 78 %
Platelets: 261 10*3/uL (ref 150–400)
RBC: 4.88 MIL/uL (ref 4.22–5.81)
RDW: 14.4 % (ref 11.5–15.5)
WBC: 15.3 10*3/uL — ABNORMAL HIGH (ref 4.0–10.5)
nRBC: 0 % (ref 0.0–0.2)

## 2021-03-14 LAB — BASIC METABOLIC PANEL
Anion gap: 7 (ref 5–15)
BUN: 25 mg/dL — ABNORMAL HIGH (ref 8–23)
CO2: 32 mmol/L (ref 22–32)
Calcium: 9 mg/dL (ref 8.9–10.3)
Chloride: 89 mmol/L — ABNORMAL LOW (ref 98–111)
Creatinine, Ser: 0.62 mg/dL (ref 0.61–1.24)
GFR, Estimated: >60 mL/min (ref >60)
Glucose, Bld: 93 mg/dL (ref 70–99)
Potassium: 3.6 mmol/L (ref 3.5–5.1)
Sodium: 128 mmol/L — ABNORMAL LOW (ref 135–145)

## 2021-03-14 MED ORDER — DOXYCYCLINE HYCLATE 100 MG PO TABS
100.0000 mg | ORAL_TABLET | Freq: Two times a day (BID) | ORAL | Status: DC
  Administered 2021-03-14 – 2021-03-15 (×3): 100 mg via ORAL
  Filled 2021-03-14 (×3): qty 1

## 2021-03-14 MED ORDER — FUROSEMIDE 10 MG/ML IJ SOLN
20.0000 mg | Freq: Once | INTRAMUSCULAR | Status: AC
  Administered 2021-03-14: 20 mg via INTRAVENOUS
  Filled 2021-03-14: qty 2

## 2021-03-14 NOTE — TOC Progression Note (Signed)
Transition of Care [facility]) - Progression Note    Patient Details  Name: Curtis Campos MRN: [id number] Date of Birth: 01/14/1941  Transition of Care [facility]) CM/SW [place], RN Phone Number: 03/14/2021, 2:47 PM  Clinical Narrative: Spoke with patient about discharge needs, Son and daughter-in-law at bedside. Patient says he will discharge tomorrow morning with Oxygen and consents to HHPT. [facility] notified and will provide PT services. Attending notified to write discharge orders for Oxygen so it can be arranged today. Son will provide transportation.           Expected Discharge Plan and Services                                                 Social Determinants of Health (SDOH) Interventions    Readmission Risk Interventions No flowsheet data found.

## 2021-03-14 NOTE — Progress Notes (Signed)
PROGRESS NOTE    KAHNE HELFAND  DSK:[id number] DOB: [birth year] DOA: 03/10/2021 PCP: [name], NP    Brief Narrative:  Curtis Campos is 80 y.o. male with past medical history of systolic congestive heart failure with improved ejection fraction with last LV ejection fraction of 55 to 60% in April 2022, history of coronary artery disease, moderate aortic stenosis, history of rectal cancer, COPD with history of previous oxygen use though not on oxygen currently presented to hospital with 2 weeks history of wheezing and shortness of breath with low pulse ox in the 80s at home.  Patient had seen his cardiologist on 02/11/21 and follow-up echocardiogram was scheduled at that time.  In the ED patient was noted to be tachypneic with respiratory rate at 22 with pulse ox of 84% on room air.  Troponin borderline.  There was note of mild hyponatremia with sodium of 131.  COVID and flu was negative.  EKG showed normal sinus rhythm.  CTA chest PE protocol showed no evidence of PE but COPD with increased changes of bronchitis.  In the ED patient was given duo nebs Solu-Medrol azithromycin and was admitted hospital for further evaluation and treatment   Assessment & Plan:   Principal Problem:   COPD with acute exacerbation ([place]) Active Problems:   Essential hypertension   Acute respiratory failure with hypoxia (HCC)   Coronary artery disease   Chronic systolic heart failure (HCC)   Moderate aortic stenosis   Heart failure with preserved ejection fraction (HCC)   Acute hypoxemic respiratory failure which is likely multifactorial.  Suspect CHF exacerbation with history of chronic combined heart failure.  Initially treated for COPD exacerbation. Bronchitis with mucous plugging right lower lobe.  Presented with orthopnea.  Reportedly mildly volume overloaded on presentation.  BNP elevated.  Echocardiogram with ejection fraction 55%. Currently on carvedilol and Entresto.  Also has history of  aortic stenosis. Treated with IV diuresis and currently remains on oral Lasix. Euvolemic today.  Will monitor.  COPD with acute exacerbation: Suspected present on admission.  Patient with ongoing cough and mucoid secretions.  Unable to expectorate. Continue aggressive bronchodilator therapies. aggressive chest physiotherapy and flutter valve.  Mobilize. Keep on oxygen to keep saturations more than 90%.   CT angiogram of the chest was negative for pulmonary embolism.  It does show advanced COPD. Repeat chest x-ray done today and reviewed, shows right lower lobe atelectasis and questionable pneumonia which is probably atelectasis due to mucous plugging.  Essential hypertension: Blood pressures fairly stable today.  On Entresto and Coreg.  Coronary artery disease: No acute issues.  Troponins negative.  EKG nonacute.  Patient on aspirin, Coreg and Crestor.  Abnormal CT scan of the chest: Radiology recommended 82-month follow-up.  DVT prophylaxis: enoxaparin (LOVENOX) injection 40 mg Start: 03/11/21 0800   Code Status: Full code Family Communication: Daughter and son-in-law at the bedside. Disposition Plan: Status is: Inpatient  Remains inpatient appropriate because: Still on significant oxygen.  Patient needing more than 6 L of oxygen on mobility.  We will continue to provide more chest physiotherapy in the hospital.   Consultants:  Cardiology  Procedures:  None  Antimicrobials:  Doxycycline 12/16---   Subjective:  Patient seen and examined.  Very unhappy that I did not discharge him.  He is able to produce more sputum today.  On 6 L at rest.  Denies any chest pain or shortness of breath at rest.  Afebrile. Repeat chest x-ray was ordered today that shows right  lower lobe airspace disease, suspect mucous plugging. Discussed in detail with patient's daughter at the bedside and patient barely agreed to stay back to do more chest physiotherapy today and anticipate home oxygen  tomorrow.  Objective: Vitals:   03/14/21 0612 03/14/21 0741 03/14/21 1100 03/14/21 1153  BP: (!) 148/72 123/76  132/81  Pulse: 70 68  (!) 103  Resp: 16 16  16   Temp: (!) 97.4 F (36.3 C) 97.7 F (36.5 C)  97.7 F (36.5 C)  TempSrc: Oral Oral    SpO2: 94% 96% 92% 94%  Weight:      Height:        Intake/Output Summary (Last 24 hours) at 03/14/2021 1259 Last data filed at 03/14/2021 0617 Gross per 24 hour  Intake 450 ml  Output 1250 ml  Net -800 ml   Filed Weights   03/10/21 1247 03/11/21 0153  Weight: 77.1 kg 74.8 kg    Examination:  General: Looks fairly stable and comfortable at rest.  Anxious today because of unable to go home. Mildly short of breath on mobility.  On 6 L oxygen. Cardiovascular: S1-S2 normal.  Regular rate rhythm.  No edema. Respiratory: Bilateral good air entry.  Slightly decreased air entry in the right base. Gastrointestinal: Soft.  Nontender.  Bowel sound present. Ext: No edema.  No cyanosis or swelling. Neuro: Alert on x4.  No focal deficits.      Data Reviewed: I have personally reviewed following labs and imaging studies  CBC: Recent Labs  Lab 03/10/21 1254 03/11/21 0013 03/12/21 0541 03/14/21 0945  WBC 9.9 10.8* 15.1* 15.3*  NEUTROABS  --   --   --  11.9*  HGB 14.0 14.2 14.6 14.5  HCT 42.8 43.1 43.3 42.8  MCV 89.9 88.1 88.2 87.7  PLT 269 274 292 700   Basic Metabolic Panel: Recent Labs  Lab 03/10/21 1254 03/11/21 0013 03/12/21 0541 03/14/21 0945  NA 131*  --  133* 128*  K 3.7  --  4.1 3.6  CL 93*  --  92* 89*  CO2 32  --  34* 32  GLUCOSE 93  --  123* 93  BUN 14  --  30* 25*  CREATININE 0.68 0.55* 0.90 0.62  CALCIUM 9.4  --  9.5 9.0  MG  --   --  2.4  --    GFR: Estimated Creatinine Clearance: 66.5 mL/min (by C-G formula based on SCr of 0.62 mg/dL). Liver Function Tests: No results for input(s): AST, ALT, ALKPHOS, BILITOT, PROT, ALBUMIN in the last 168 hours. No results for input(s): LIPASE, AMYLASE in the last  168 hours. No results for input(s): AMMONIA in the last 168 hours. Coagulation Profile: No results for input(s): INR, PROTIME in the last 168 hours. Cardiac Enzymes: No results for input(s): CKTOTAL, CKMB, CKMBINDEX, TROPONINI in the last 168 hours. BNP (last 3 results) No results for input(s): PROBNP in the last 8760 hours. HbA1C: No results for input(s): HGBA1C in the last 72 hours. CBG: No results for input(s): GLUCAP in the last 168 hours. Lipid Profile: No results for input(s): CHOL, HDL, LDLCALC, TRIG, CHOLHDL, LDLDIRECT in the last 72 hours. Thyroid Function Tests: No results for input(s): TSH, T4TOTAL, FREET4, T3FREE, THYROIDAB in the last 72 hours. Anemia Panel: No results for input(s): VITAMINB12, FOLATE, FERRITIN, TIBC, IRON, RETICCTPCT in the last 72 hours. Sepsis Labs: No results for input(s): PROCALCITON, LATICACIDVEN in the last 168 hours.  Recent Results (from the past 240 hour(s))  Resp Panel by RT-PCR (Flu  A&B, Covid) Nasopharyngeal Swab     Status: None   Collection Time: 03/10/21 12:57 PM   Specimen: Nasopharyngeal Swab; Nasopharyngeal(NP) swabs in vial transport medium  Result Value Ref Range Status   SARS Coronavirus 2 by RT PCR NEGATIVE NEGATIVE Final    Comment: (NOTE) SARS-CoV-2 target nucleic acids are NOT DETECTED.  The SARS-CoV-2 RNA is generally detectable in upper respiratory specimens during the acute phase of infection. The lowest concentration of SARS-CoV-2 viral copies this assay can detect is 138 copies/mL. A negative result does not preclude SARS-Cov-2 infection and should not be used as the sole basis for treatment or other patient management decisions. A negative result may occur with  improper specimen collection/handling, submission of specimen other than nasopharyngeal swab, presence of viral mutation(s) within the areas targeted by this assay, and inadequate number of viral copies(<138 copies/mL). A negative result must be combined  with clinical observations, patient history, and epidemiological information. The expected result is Negative.  Fact Sheet for Patients:  EntrepreneurPulse.com.au  Fact Sheet for Healthcare Providers:  IncredibleEmployment.be  This test is no t yet approved or cleared by the [place] FDA and  has been authorized for detection and/or diagnosis of SARS-CoV-2 by FDA under an Emergency Use Authorization (EUA). This EUA will remain  in effect (meaning this test can be used) for the duration of the COVID-19 declaration under Section 564(b)(1) of the Act, 21 U.S.C.section 360bbb-3(b)(1), unless the authorization is terminated  or revoked sooner.       Influenza A by PCR NEGATIVE NEGATIVE Final   Influenza B by PCR NEGATIVE NEGATIVE Final    Comment: (NOTE) The Xpert Xpress SARS-CoV-2/FLU/RSV plus assay is intended as an aid in the diagnosis of influenza from Nasopharyngeal swab specimens and should not be used as a sole basis for treatment. Nasal washings and aspirates are unacceptable for Xpert Xpress SARS-CoV-2/FLU/RSV testing.  Fact Sheet for Patients: EntrepreneurPulse.com.au  Fact Sheet for Healthcare Providers: IncredibleEmployment.be  This test is not yet approved or cleared by the [place] FDA and has been authorized for detection and/or diagnosis of SARS-CoV-2 by FDA under an Emergency Use Authorization (EUA). This EUA will remain in effect (meaning this test can be used) for the duration of the COVID-19 declaration under Section 564(b)(1) of the Act, 21 U.S.C. section 360bbb-3(b)(1), unless the authorization is terminated or revoked.  Performed at [facility], [address], [place], [place] [address]          Radiology Studies: [facility] Chest [place] 1 View  Result Date: 03/14/2021 CLINICAL DATA:  Hypoxia. EXAM: PORTABLE CHEST 1 VIEW COMPARISON:  Chest radiograph 03/10/2021  and chest CT 03/10/2021 FINDINGS: Portable AP views of the chest were obtained. Concern for new densities in the right lower chest. Chronic densities in the apices, right side greater than left. Heart and mediastinum are stable. Negative for a pneumothorax. IMPRESSION: New opacities in the right lower lung. Findings are concerning for area of infection. Recommend follow-up with two view chest examination. Electronically Signed   By: Markus Daft M.D.   On: 03/14/2021 10:40        Scheduled Meds:  aspirin EC  81 mg Oral Daily   carvedilol  12.5 mg Oral BID   cholecalciferol  1,000 Units Oral Daily   doxycycline  100 mg Oral Q12H   enoxaparin (LOVENOX) injection  40 mg Subcutaneous Q24H   ipratropium-albuterol  3 mL Nebulization Q6H   sacubitril-valsartan  1 tablet Oral BID   Continuous Infusions:  LOS: 4 days    Time spent: 30 minutes    Barb Merino, MD [facility] Pager [phone]

## 2021-03-15 ENCOUNTER — Inpatient Hospital Stay: Payer: Non-veteran care

## 2021-03-15 MED ORDER — GUAIFENESIN 100 MG/5ML PO LIQD: 10.0000 mL | ORAL | 0 refills | Status: DC | PRN

## 2021-03-15 MED ORDER — DOXYCYCLINE HYCLATE 100 MG PO TABS: 100.0000 mg | ORAL_TABLET | Freq: Two times a day (BID) | ORAL | 0 refills | Status: AC

## 2021-03-15 NOTE — TOC Progression Note (Signed)
Transition of Care [facility]) - Progression Note    Patient Details  Name: [name] MRN: [id number] Date of Birth: 1940-06-12  Transition [facility]) CM/SW [place], RN Phone Number: 03/15/2021, 2:39 PM  Clinical Narrative:   Notified Jasmine with [employer] that the patient needs Oxygen to DC home today, orders and O2 sat note is in, will be delivered to the room prior to DC, The patient is set up with [facility] for [facility] , no additional TOC needs         Expected Discharge Plan and Services           Expected Discharge Date: 03/15/21                                     Social Determinants of Health (SDOH) Interventions    Readmission Risk Interventions No flowsheet data found.

## 2021-03-15 NOTE — Progress Notes (Signed)
SATURATION QUALIFICATIONS:   Patient Saturations on rest on 3L oxygen= 92%  Patient Saturations on 4 Liters of oxygen while Ambulating = 91%  Please briefly explain why patient needs home oxygen:

## 2021-03-15 NOTE — Discharge Summary (Signed)
Physician Discharge Summary  Curtis Campos:[id number] DOB: [birth year] DOA: 03/10/2021  PCP: [name], NP  Admit date: 03/10/2021 Discharge date: 03/15/2021  Admitted From: Home Disposition: Home  Recommendations for Outpatient Follow-up:  Follow up with PCP in 1-2 weeks Please obtain BMP/CBC in one week Cardiology to schedule follow-up. Will send to pulmonary for evaluation.  Home Health: PT/OT/home health aide Equipment/Devices: Oxygen  Discharge Condition: Stable CODE STATUS: Full code Diet recommendation: Low-salt diet  Discharge summary: 80 year old gentleman with history of chronic systolic heart failure, coronary artery disease, moderate aortic stenosis, history of rectal cancer with colostomy, history of COPD admitted to the hospital with about 2 weeks of shortness of breath and wheezing.   In the emergency room he was tachypneic, 84% on room air.  Troponins borderline.  COVID and influenza negative.  CTA of the chest with no PE but evidence of COPD, mucous plugging with no pneumonia.     Acute hypoxemic respiratory failure which is likely multifactorial.   Suspected multifactorial with combination of COPD, bronchitis and mucous plugging, chronic systolic heart failure.   He was admitted to the hospital and treated with bronchodilator therapy, mucolytic, chest physiotherapy.   Heart failure regimen with optimized.   Did good clinical recovery, however continued to need supplemental oxygen to keep saturation more than 90% at rest and with mobility.   X-ray and repeat x-ray consistent with right-sided airspace disease.   Patient adequately improved and wanting to go home,  Discharged home with 5 additional days of doxycycline.   Chest physiotherapy including use of incentive spirometry and flutter valve therapy at home.  Mucolytic's.  Patient is using albuterol inhaler at home that he will continue to use.  Will refer to pulmonary for follow-up and PFT.  May  also benefit with sleep apnea studies.   Initial orthopnea improved.  Echocardiogram with ejection fraction 55%.  Optimized on heart failure regimen.   Essential hypertension: Blood pressures fairly stable today.  On Entresto and Coreg.  Coronary artery disease: No acute issues.  Troponins negative.  EKG nonacute.  Patient on aspirin, Coreg and Crestor.  Patient fairly stable, still needing oxygen.  Willing to stay home and get treated.  Discharged home.  Will benefit with home health PT and home health aide.       Discharge Diagnoses:  Principal Problem:   COPD with acute exacerbation ([facility]) Active Problems:   Essential hypertension   Acute respiratory failure with hypoxia ([facility])   Coronary artery disease   Chronic systolic heart failure ([facility])   Moderate aortic stenosis   Heart failure with preserved ejection fraction [facility])    Discharge Instructions  Discharge Instructions     Call MD for:  difficulty breathing, headache or visual disturbances   Complete by: As directed    Diet - low sodium heart healthy   Complete by: As directed    Discharge instructions   Complete by: As directed    Continue to use oxygen, breathing exercises at home   Increase activity slowly   Complete by: As directed       Allergies as of 03/15/2021       Reactions   Codeine Nausea Only   Statins Other (See Comments)   REACTION: maylgias        Medication List     TAKE these medications    albuterol 108 (90 Base) MCG/ACT inhaler Commonly known as: VENTOLIN HFA INHALE 2 PUFFS BY MOUTH EVERY 6 HOURS AS NEEDED FOR SHORTNESS  OF BREATH   Alirocumab 75 MG/ML Soaj Commonly known as: Praluent Inject 1 pen into the skin every 14 (fourteen) days.   aspirin 81 MG tablet Take 81 mg by mouth daily. Takes at night   carvedilol 12.5 MG tablet Commonly known as: COREG TAKE 1 TABLET BY MOUTH TWICE DAILY   cholecalciferol 25 MCG (1000 UNIT) tablet Commonly known as: VITAMIN D3 Take 1,000  Units by mouth daily.   diclofenac Sodium 1 % Gel Commonly known as: VOLTAREN APPLY 4 GRAMS TO AFFECTED AREA FOUR TIMES A DAY AS NEEDED FOR HIP PAIN-TOTAL DAILY DOSE SHOULD NOT EXCEED 32G PER DAY OVER ALL AFFECTEDJOINTS.FOR PAIN.   doxycycline 100 MG tablet Commonly known as: VIBRA-TABS Take 1 tablet (100 mg total) by mouth every 12 (twelve) hours for 6 days.   Entresto 24-26 MG Generic drug: sacubitril-valsartan Take 1 tablet by mouth 2 (two) times daily.   ezetimibe 10 MG tablet Commonly known as: ZETIA Take 1 tablet by mouth once daily for cholesterol. NEED APPOINTMENT FOR ANY MORE REFILLS   furosemide 20 MG tablet Commonly known as: LASIX TAKE 1 TABLET BY MOUTH ONCE DAILY.   guaiFENesin 100 MG/5ML liquid Commonly known as: ROBITUSSIN Take 10 mLs by mouth every 4 (four) hours as needed for cough or to loosen phlegm.   magnesium oxide 400 (240 Mg) MG tablet Commonly known as: MAG-OX TAKE 1 TABLET BY MOUTH ONCE DAILY.   rosuvastatin 40 MG tablet Commonly known as: CRESTOR   traZODone 50 MG tablet Commonly known as: DESYREL Take 1 tablet (50 mg total) by mouth at bedtime as needed. for sleep               Durable Medical Equipment  (From admission, onward)           Start     Ordered   03/15/21 1429  DME Oxygen  Once       Comments: Oxygen saturation at rest in room air = 87% Patient Saturations on rest on 3L oxygen= 92%   Patient Saturations on 4 Liters of oxygen while Ambulating = 91%  Question Answer Comment  Length of Need Lifetime   Mode or (Route) Nasal cannula   Liters per Minute 4   Frequency Continuous (stationary and portable oxygen unit needed)   Oxygen conserving device Yes   Oxygen delivery system Gas      03/15/21 1430   03/14/21 1449  For home use only DME oxygen  Once       Question Answer Comment  Length of Need Lifetime   Mode or (Route) Nasal cannula   Liters per Minute 6   Frequency Continuous (stationary and portable oxygen  unit needed)   Oxygen conserving device Yes   Oxygen delivery system Gas      03/14/21 1448            Follow-up Information     [name], NP Follow up in 2 week(s).   Specialty: Internal Medicine Contact information: [place] [address] [phone]         Wellington Hampshire, MD .   Specialty: Cardiology Contact information: [address] [place] [address] [phone]                Allergies  Allergen Reactions   Codeine Nausea Only   Statins Other (See Comments)    REACTION: maylgias    Consultations: Cardiology   Procedures/Studies: DG Chest 2 View  Result Date: 03/10/2021 CLINICAL DATA:  Chest pain and shortness of breath. EXAM: CHEST - 2 VIEW COMPARISON:  06/10/2015 and CT chest 03/01/2013. FINDINGS: Trachea is midline. Heart size normal. Thoracic aorta is calcified. Biapical pleuroparenchymal scarring, as on 03/01/2013. Additional scarring in the lingula. No airspace consolidation or pleural fluid. IMPRESSION: No acute findings. Electronically Signed   By: Lorin Picket M.D.   On: 03/10/2021 13:21   CT Angio Chest PE W and/or Wo Contrast  Result Date: 03/10/2021 CLINICAL DATA:  Shortness of breath and hypoxia. EXAM: CT ANGIOGRAPHY CHEST WITH CONTRAST TECHNIQUE: Multidetector CT imaging of the chest was performed using the standard protocol during bolus administration of intravenous contrast. Multiplanar CT image reconstructions and MIPs were obtained to evaluate the vascular anatomy. CONTRAST:  45mL OMNIPAQUE IOHEXOL 350 MG/ML SOLN COMPARISON:  Chest CT with IV contrast 03/01/2013, PA and lateral chest today, PA Lat chest 06/10/2015 FINDINGS: Cardiovascular: There is mild cardiomegaly with left chamber predominance. Once again the superior pulmonary veins are mildly distended but no more than previously. The pulmonary arteries are normal in caliber without evidence of thromboemboli or right heart  strain. There is no pericardial effusion. There is moderate to heavy patchy three-vessel calcific CAD and moderate to heavy aortic calcific plaques extending into the great vessels. No great vessel or aortic aneurysm or stenosis is seen. The ascending aorta is ectatic at 3.7 cm caliber, the descending aorta ectatic up to 3.3 cm with slight aortic tortuosity. Scattered mixed plaque involves the descending aorta without visible ulcerative plaque or dissection. Mediastinum/Nodes: Stable subcarinal nodal complex 1.2 cm in short axis. Few stable slightly prominent hilar lymph nodes also noted with stable borderline prominent right paratracheal and precarinal nodes. No new or worsening adenopathy. Lungs/Pleura: There are mild centrilobular emphysematous changes and bilateral apical pleuroparenchymal scarring, with paraseptal emphysematous changes asymmetrically in the right apex. There is new demonstration of a 6.1 mm noncalcified nodule in the left upper lobe posteriorly on series 6 axial 38. Small chronic consolidation in the lingular base is unchanged as well as a few basilar scattered linear scar-like opacities. No pleural effusion or pneumothorax. There is interval increased bilateral infrahilar lower lobe bronchial thickening of bronchitis with bronchiolar mucous plugging on the right. Upper Abdomen: Scattered calcified granulomas both liver and spleen. Musculoskeletal: There is osteopenia and degenerative change of the thoracic spine. Mild gynecomastia. Review of the MIP images confirms the above findings. IMPRESSION: 1. No evidence of arterial embolus or dilatation. 2. Aortic atherosclerosis.  Coronary artery atherosclerosis. 3. COPD with increased changes of lower lobe infrahilar bronchitis with infrahilar bronchiolar mucous plugging in the right lower lobe. 4. Mild cardiomegaly and mild chronic distention of the superior pulmonary veins. No pericardial or pleural effusion. No pulmonary edema is seen. 5. Biapical  pleural-parenchymal scarring changes. 6. New 6.1 mm noncalcified left upper lobe nodule. Six-month follow-up chest CT recommended. 7. Stable borderline and slightly enlarged thoracic lymph nodes. 8. Mild subareolar gynecomastia. Electronically Signed   By: Telford Nab M.D.   On: 03/10/2021 22:48   DG Chest Port 1 View  Result Date: 03/15/2021 CLINICAL DATA:  Shortness of breath EXAM: PORTABLE CHEST 1 VIEW COMPARISON:  Chest radiograph 1 day prior FINDINGS: The cardiomediastinal silhouette is stable Patchy opacities are again seen in the right lower lobe, unchanged since the study from 1 day prior but new since 03/10/2021. Patchy opacities in the right upper lobe are also slightly increased in conspicuity. Biapical pleural scarring is unchanged. There is no pleural effusion or  pneumothorax. There is no acute osseous abnormality. IMPRESSION: Patchy opacities in the right upper and lower lung, increased in conspicuity since 03/10/2021, concerning for developing infection. The opacities in the right upper lobe are slightly worsened since the study from 1 day prior. Electronically Signed   By: Valetta Mole M.D.   On: 03/15/2021 09:33   DG Chest Port 1 View  Result Date: 03/14/2021 CLINICAL DATA:  Hypoxia. EXAM: PORTABLE CHEST 1 VIEW COMPARISON:  Chest radiograph 03/10/2021 and chest CT 03/10/2021 FINDINGS: Portable AP views of the chest were obtained. Concern for new densities in the right lower chest. Chronic densities in the apices, right side greater than left. Heart and mediastinum are stable. Negative for a pneumothorax. IMPRESSION: New opacities in the right lower lung. Findings are concerning for area of infection. Recommend follow-up with two view chest examination. Electronically Signed   By: Markus Daft M.D.   On: 03/14/2021 10:40   ECHOCARDIOGRAM COMPLETE  Result Date: 03/12/2021    ECHOCARDIOGRAM REPORT   Patient Name:   Curtis Campos Date of Exam: 03/12/2021 Medical Rec #:  [id number]         Height:       66.0 in Accession #:    [id number]       Weight:       164.9 lb Date of Birth:  November 21, 1940        BSA:          1.842 m Patient Age:    80 years         BP:           139/65 mmHg Patient Gender: M                HR:           71 bpm. Exam Location:  [facility] Procedure: 2D Echo, Cardiac Doppler and Color Doppler Indications:     CHF-acute diastolic Z56.38  History:         Patient has prior history of Echocardiogram examinations, most                  recent 07/23/2020. CAD, COPD; Risk Factors:Hypertension and                  Dyslipidemia. Moderate Aortic stenosis.  Sonographer:     Sherrie Sport Referring Phys:  [id number] Athena Masse Diagnosing Phys: Kate Sable MD  Sonographer Comments: No parasternal window. Image acquisition challenging due to COPD. IMPRESSIONS  1. Left ventricular ejection fraction, by estimation, is 55%. The left ventricle has normal function. The left ventricle has no regional wall motion abnormalities. There is mild left ventricular hypertrophy. Left ventricular diastolic parameters are consistent with Grade II diastolic dysfunction (pseudonormalization).  2. Right ventricular systolic function is normal. The right ventricular size is normal.  3. The mitral valve is normal in structure. Mild mitral valve regurgitation.  4. Aortic valve peak gradient 50, mean gradient 34, DVI 0.23, Vmax 3.74m/s, AVA 0.75cm2. The aortic valve is calcified. Aortic valve regurgitation is not visualized. Moderate to severe aortic valve stenosis.  5. The inferior vena cava is normal in size with greater than 50% respiratory variability, suggesting right atrial pressure of 3 mmHg. FINDINGS  Left Ventricle: Left ventricular ejection fraction, by estimation, is 55%. The left ventricle has normal function. The left ventricle has no regional wall motion abnormalities. The left ventricular internal cavity size was normal in size. There is mild left ventricular hypertrophy. Left ventricular diastolic  parameters are consistent with Grade II diastolic dysfunction (pseudonormalization). Right Ventricle: The right ventricular size is normal. No increase in right ventricular wall thickness. Right ventricular systolic function is normal. Left Atrium: Left atrial size was normal in size. Right Atrium: Right atrial size was normal in size. Pericardium: There is no evidence of pericardial effusion. Mitral Valve: The mitral valve is normal in structure. Mild mitral valve regurgitation. MV peak gradient, 4.8 mmHg. The mean mitral valve gradient is 3.0 mmHg. Tricuspid Valve: The tricuspid valve is normal in structure. Tricuspid valve regurgitation is mild. Aortic Valve: Aortic valve peak gradient 50, mean gradient 34, DVI 0.23, Vmax 3.43m/s, AVA 0.75cm2. The aortic valve is calcified. Aortic valve regurgitation is not visualized. Moderate to severe aortic stenosis is present. Aortic valve mean gradient measures 27.8 mmHg. Aortic valve peak gradient measures 42.5 mmHg. Aortic valve area, by VTI measures 0.82 cm. Pulmonic Valve: The pulmonic valve was not well visualized. Pulmonic valve regurgitation is not visualized. Aorta: The aortic root is normal in size and structure. Venous: The inferior vena cava is normal in size with greater than 50% respiratory variability, suggesting right atrial pressure of 3 mmHg. IAS/Shunts: No atrial level shunt detected by color flow Doppler.  LEFT VENTRICLE PLAX 2D LVIDd:         4.70 cm   Diastology LVIDs:         3.30 cm   LV e' medial:    4.03 cm/s LV PW:         1.20 cm   LV E/e' medial:  20.1 LV IVS:        1.10 cm   LV e' lateral:   5.55 cm/s LVOT diam:     2.00 cm   LV E/e' lateral: 14.6 LV SV:         68 LV SV Index:   37 LVOT Area:     3.14 cm  RIGHT VENTRICLE RV S prime:     12.00 cm/s TAPSE (M-mode): 3.5 cm LEFT ATRIUM           Index        RIGHT ATRIUM           Index LA diam:      3.50 cm 1.90 cm/m   RA Area:     14.50 cm LA Vol (A4C): 28.6 ml 15.52 ml/m  RA Volume:   35.30  ml  19.16 ml/m  AORTIC VALVE                     PULMONIC VALVE AV Area (Vmax):    0.65 cm      PV Vmax:        0.95 m/s AV Area (Vmean):   0.60 cm      PV Vmean:       70.900 cm/s AV Area (VTI):     0.82 cm      PV VTI:         0.259 m AV Vmax:           326.00 cm/s   PV Peak grad:   3.6 mmHg AV Vmean:          252.200 cm/s  PV Mean grad:   2.0 mmHg AV VTI:            0.831 m       RVOT Peak grad: 4 mmHg AV Peak Grad:      42.5 mmHg AV Mean Grad:  27.8 mmHg LVOT Vmax:         67.20 cm/s LVOT Vmean:        48.500 cm/s LVOT VTI:          0.218 m LVOT/AV VTI ratio: 0.26 MITRAL VALVE                TRICUSPID VALVE MV Area (PHT): 2.80 cm     TR Peak grad:   37.9 mmHg MV Area VTI:   2.40 cm     TR Vmax:        308.00 cm/s MV Peak grad:  4.8 mmHg MV Mean grad:  3.0 mmHg     SHUNTS MV Vmax:       1.09 m/s     Systemic VTI:  0.22 m MV Vmean:      77.2 cm/s    Systemic Diam: 2.00 cm MV Decel Time: 271 msec     Pulmonic VTI:  0.219 m MV E velocity: 81.00 cm/s MV A velocity: 109.00 cm/s MV E/A ratio:  0.74 Kate Sable MD Electronically signed by Kate Sable MD Signature Date/Time: 03/12/2021/2:47:42 PM    Final    (Echo, Carotid, EGD, Colonoscopy, ERCP)    Subjective: Patient seen and examined.  I examined him again in the afternoon with his willingness to go home today.  He had minimal to no symptoms.  No chest pain or shortness of breath.  Has cough and able to bring up more sputum now.  Wants to go home.  He feels comfortable at home.  Walked around in the nursing station on 3 to 4 L of oxygen and was comfortable.   Discharge Exam: Vitals:   03/15/21 0819 03/15/21 1206  BP:  (!) 103/55  Pulse: 71 73  Resp: 16 18  Temp:  98 F (36.7 C)  SpO2: 94% 95%   Vitals:   03/15/21 0554 03/15/21 0757 03/15/21 0819 03/15/21 1206  BP: (!) 118/55 (!) 118/55  (!) 103/55  Pulse: 62 (!) 57 71 73  Resp: 16 20 16 18   Temp: 98.4 F (36.9 C) 98.6 F (37 C)  98 F (36.7 C)  TempSrc:      SpO2: 97%  100% 94% 95%  Weight:      Height:        General: Pt is alert, awake, not in acute distress Using 3 L of oxygen at rest. Cardiovascular: RRR, S1/S2 +, no rubs, no gallops Respiratory: CTA bilaterally, no wheezing, no rhonchi, no added sounds. Abdominal: Soft, NT, ND, bowel sounds +, colostomy pouch left lower quadrant. Extremities: no edema, no cyanosis    The results of significant diagnostics from this hospitalization (including imaging, microbiology, ancillary and laboratory) are listed below for reference.     Microbiology: Recent Results (from the past 240 hour(s))  Resp Panel by RT-PCR (Flu A&B, Covid) Nasopharyngeal Swab     Status: None   Collection Time: 03/10/21 12:57 PM   Specimen: Nasopharyngeal Swab; Nasopharyngeal(NP) swabs in vial transport medium  Result Value Ref Range Status   SARS Coronavirus 2 by RT PCR NEGATIVE NEGATIVE Final    Comment: (NOTE) SARS-CoV-2 target nucleic acids are NOT DETECTED.  The SARS-CoV-2 RNA is generally detectable in upper respiratory specimens during the acute phase of infection. The lowest concentration of SARS-CoV-2 viral copies this assay can detect is 138 copies/mL. A negative result does not preclude SARS-Cov-2 infection and should not be used as the sole basis for treatment or other patient management decisions. A negative  result may occur with  improper specimen collection/handling, submission of specimen other than nasopharyngeal swab, presence of viral mutation(s) within the areas targeted by this assay, and inadequate number of viral copies(<138 copies/mL). A negative result must be combined with clinical observations, patient history, and epidemiological information. The expected result is Negative.  Fact Sheet for Patients:  EntrepreneurPulse.com.au  Fact Sheet for Healthcare Providers:  IncredibleEmployment.be  This test is no t yet approved or cleared by the [place] FDA  and  has been authorized for detection and/or diagnosis of SARS-CoV-2 by FDA under an Emergency Use Authorization (EUA). This EUA will remain  in effect (meaning this test can be used) for the duration of the COVID-19 declaration under Section 564(b)(1) of the Act, 21 U.S.C.section 360bbb-3(b)(1), unless the authorization is terminated  or revoked sooner.       Influenza A by PCR NEGATIVE NEGATIVE Final   Influenza B by PCR NEGATIVE NEGATIVE Final    Comment: (NOTE) The Xpert Xpress SARS-CoV-2/FLU/RSV plus assay is intended as an aid in the diagnosis of influenza from Nasopharyngeal swab specimens and should not be used as a sole basis for treatment. Nasal washings and aspirates are unacceptable for Xpert Xpress SARS-CoV-2/FLU/RSV testing.  Fact Sheet for Patients: EntrepreneurPulse.com.au  Fact Sheet for Healthcare Providers: IncredibleEmployment.be  This test is not yet approved or cleared by the [place] FDA and has been authorized for detection and/or diagnosis of SARS-CoV-2 by FDA under an Emergency Use Authorization (EUA). This EUA will remain in effect (meaning this test can be used) for the duration of the COVID-19 declaration under Section 564(b)(1) of the Act, 21 U.S.C. section 360bbb-3(b)(1), unless the authorization is terminated or revoked.  Performed at [facility], [address] [place], [place] [address]      Labs: BNP (last 3 results) Recent Labs    03/11/21 0013  BNP 413.2*   Basic Metabolic Panel: Recent Labs  Lab 03/10/21 1254 03/11/21 0013 03/12/21 0541 03/14/21 0945  NA 131*  --  133* 128*  K 3.7  --  4.1 3.6  CL 93*  --  92* 89*  CO2 32  --  34* 32  GLUCOSE 93  --  123* 93  BUN 14  --  30* 25*  CREATININE 0.68 0.55* 0.90 0.62  CALCIUM 9.4  --  9.5 9.0  MG  --   --  2.4  --    Liver Function Tests: No results for input(s): AST, ALT, ALKPHOS, BILITOT, PROT, ALBUMIN in the last 168  hours. No results for input(s): LIPASE, AMYLASE in the last 168 hours. No results for input(s): AMMONIA in the last 168 hours. CBC: Recent Labs  Lab 03/10/21 1254 03/11/21 0013 03/12/21 0541 03/14/21 0945  WBC 9.9 10.8* 15.1* 15.3*  NEUTROABS  --   --   --  11.9*  HGB 14.0 14.2 14.6 14.5  HCT 42.8 43.1 43.3 42.8  MCV 89.9 88.1 88.2 87.7  PLT 269 274 292 261   Cardiac Enzymes: No results for input(s): CKTOTAL, CKMB, CKMBINDEX, TROPONINI in the last 168 hours. BNP: Invalid input(s): POCBNP CBG: No results for input(s): GLUCAP in the last 168 hours. D-Dimer No results for input(s): DDIMER in the last 72 hours. Hgb A1c No results for input(s): HGBA1C in the last 72 hours. Lipid Profile No results for input(s): CHOL, HDL, LDLCALC, TRIG, CHOLHDL, LDLDIRECT in the last 72 hours. Thyroid function studies No results for input(s): TSH, T4TOTAL, T3FREE, THYROIDAB in the last 72 hours.  Invalid input(s): FREET3 Anemia work up No results for input(s): VITAMINB12, FOLATE, FERRITIN, TIBC, IRON, RETICCTPCT in the last 72 hours. Urinalysis    Component Value Date/Time   COLORURINE Yellow 04/03/2012 1819   APPEARANCEUR Hazy 04/03/2012 1819   LABSPEC 1.010 04/03/2012 1819   PHURINE 6.0 04/03/2012 1819   GLUCOSEU Negative 04/03/2012 1819   HGBUR Negative 04/03/2012 1819   BILIRUBINUR Negative 04/03/2012 1819   KETONESUR 1+ 04/03/2012 1819   PROTEINUR Negative 04/03/2012 1819   NITRITE Negative 04/03/2012 1819   LEUKOCYTESUR Negative 04/03/2012 1819   Sepsis Labs Invalid input(s): PROCALCITONIN,  WBC,  LACTICIDVEN Microbiology Recent Results (from the past 240 hour(s))  Resp Panel by RT-PCR (Flu A&B, Covid) Nasopharyngeal Swab     Status: None   Collection Time: 03/10/21 12:57 PM   Specimen: Nasopharyngeal Swab; Nasopharyngeal(NP) swabs in vial transport medium  Result Value Ref Range Status   SARS Coronavirus 2 by RT PCR NEGATIVE NEGATIVE Final    Comment: (NOTE) SARS-CoV-2  target nucleic acids are NOT DETECTED.  The SARS-CoV-2 RNA is generally detectable in upper respiratory specimens during the acute phase of infection. The lowest concentration of SARS-CoV-2 viral copies this assay can detect is 138 copies/mL. A negative result does not preclude SARS-Cov-2 infection and should not be used as the sole basis for treatment or other patient management decisions. A negative result may occur with  improper specimen collection/handling, submission of specimen other than nasopharyngeal swab, presence of viral mutation(s) within the areas targeted by this assay, and inadequate number of viral copies(<138 copies/mL). A negative result must be combined with clinical observations, patient history, and epidemiological information. The expected result is Negative.  Fact Sheet for Patients:  EntrepreneurPulse.com.au  Fact Sheet for Healthcare Providers:  IncredibleEmployment.be  This test is no t yet approved or cleared by the [place] FDA and  has been authorized for detection and/or diagnosis of SARS-CoV-2 by FDA under an Emergency Use Authorization (EUA). This EUA will remain  in effect (meaning this test can be used) for the duration of the COVID-19 declaration under Section 564(b)(1) of the Act, 21 U.S.C.section 360bbb-3(b)(1), unless the authorization is terminated  or revoked sooner.       Influenza A by PCR NEGATIVE NEGATIVE Final   Influenza B by PCR NEGATIVE NEGATIVE Final    Comment: (NOTE) The Xpert Xpress SARS-CoV-2/FLU/RSV plus assay is intended as an aid in the diagnosis of influenza from Nasopharyngeal swab specimens and should not be used as a sole basis for treatment. Nasal washings and aspirates are unacceptable for Xpert Xpress SARS-CoV-2/FLU/RSV testing.  Fact Sheet for Patients: EntrepreneurPulse.com.au  Fact Sheet for Healthcare  Providers: IncredibleEmployment.be  This test is not yet approved or cleared by the [place] FDA and has been authorized for detection and/or diagnosis of SARS-CoV-2 by FDA under an Emergency Use Authorization (EUA). This EUA will remain in effect (meaning this test can be used) for the duration of the COVID-19 declaration under Section 564(b)(1) of the Act, 21 U.S.C. section 360bbb-3(b)(1), unless the authorization is terminated or revoked.  Performed at [facility], [address], [place], [place] [address]      Time coordinating discharge: 45 minutes  SIGNED:   Barb Merino, MD  [facility] 03/15/2021, 2:31 PM

## 2021-03-18 ENCOUNTER — Telehealth: Payer: Self-pay

## 2021-03-18 NOTE — Telephone Encounter (Signed)
Transition Care Management Follow-up Telephone Call Date of discharge and from where: 03/15/21 from [facility] How have you been since you were released from the hospital? Patient states feeling better bu still weak. Any questions or concerns? No  Items Reviewed: Did the pt receive and understand the discharge instructions provided? Yes  Medications obtained and verified? Yes  Other? No  Any new allergies since your discharge? No  Dietary orders reviewed? Yes Do you have support at home? Yes   Home Care and Equipment/Supplies: Were home health services ordered? yes If so, what is the name of the agency? Patient unsure   Has the agency set up a time to come to the patient's home? no Were any new equipment or medical supplies ordered?  Yes: oxygen What is the name of the medical supply agency? Patient unsure Were you able to get the supplies/equipment? yes Do you have any questions related to the use of the equipment or supplies? No  Functional Questionnaire: (I = Independent and D = Dependent) ADLs: I   Bathing/Dressing- I  Meal Prep- I  Eating- I  Maintaining continence- I  Transferring/Ambulation- I with assistance from walker  Managing Meds- I  Follow up appointments reviewed:  PCP Hospital f/u appt confirmed? Yes  Scheduled to see K. Clark NP on 03/20/21 @ 11:40am. [facility] f/u appt confirmed? No  Patient states wife palns on scheduling an appointment with Dr. April Holding Are transportation arrangements needed? No  If their condition worsens, is the pt aware to call PCP or go to the Emergency Dept.? Yes Was the patient provided with contact information for the PCP's office or ED? Yes Was to pt encouraged to call back with questions or concerns? Yes

## 2021-03-20 ENCOUNTER — Telehealth: Payer: Self-pay | Admitting: Primary Care

## 2021-03-20 ENCOUNTER — Other Ambulatory Visit: Payer: Self-pay

## 2021-03-20 ENCOUNTER — Telehealth: Payer: Medicare Other | Admitting: Primary Care

## 2021-03-20 NOTE — Telephone Encounter (Signed)
Pt daughter in law wanting to change schedule from tomorrow to today. Due to weather for tomorrow. Informed her there was an opening at 11:00 am,when I spoke to her it was 10:33 am at the time.

## 2021-03-20 NOTE — Telephone Encounter (Signed)
Pt daughter-in-law Raquel Sarna) wanting to change schedule from tomorrow to today. Due to weather for tomorrow. Informed her there was an opening at 11:00 a for today,when I spoke to her it was 10:33 am at the time. --KR

## 2021-03-20 NOTE — Telephone Encounter (Signed)
Pt needing refill on "quaifensin".--KR

## 2021-03-21 ENCOUNTER — Ambulatory Visit: Payer: Medicare Other | Admitting: Primary Care

## 2021-03-21 NOTE — Telephone Encounter (Signed)
It looks like patient is scheduled with Dr. Gena Fray for a hospital follow up on 03/26/21. I'd prefer to see patient myself as I've not seen him in over one year and given his recent hospital stay.  Can we get him rescheduled with me? If they prefer to be seen sooner, as I am out all next week, then okay to keep visit with Dr. Gena Fray.

## 2021-03-21 NOTE — Telephone Encounter (Signed)
Noted, will await notes.

## 2021-03-21 NOTE — Telephone Encounter (Signed)
Called patient he would like to keep appointment with Dr. Gena Fray.

## 2021-03-26 ENCOUNTER — Ambulatory Visit (INDEPENDENT_AMBULATORY_CARE_PROVIDER_SITE_OTHER): Payer: Medicare Other | Admitting: Family Medicine

## 2021-03-26 ENCOUNTER — Encounter: Payer: Self-pay | Admitting: Family Medicine

## 2021-03-26 ENCOUNTER — Other Ambulatory Visit: Payer: Self-pay

## 2021-03-26 VITALS — BP 106/62 | HR 91 | Temp 97.5°F | Ht 66.0 in | Wt 166.8 lb

## 2021-03-26 DIAGNOSIS — M170 Bilateral primary osteoarthritis of knee: Secondary | ICD-10-CM

## 2021-03-26 DIAGNOSIS — I5022 Chronic systolic (congestive) heart failure: Secondary | ICD-10-CM | POA: Diagnosis not present

## 2021-03-26 DIAGNOSIS — J449 Chronic obstructive pulmonary disease, unspecified: Secondary | ICD-10-CM

## 2021-03-26 LAB — CBC
HCT: 34.9 % — ABNORMAL LOW (ref 39.0–52.0)
Hemoglobin: 11.7 g/dL — ABNORMAL LOW (ref 13.0–17.0)
MCHC: 33.5 g/dL (ref 30.0–36.0)
MCV: 89.8 fl (ref 78.0–100.0)
Platelets: 330 10*3/uL (ref 150.0–400.0)
RBC: 3.89 Mil/uL — ABNORMAL LOW (ref 4.22–5.81)
RDW: 14.5 % (ref 11.5–15.5)
WBC: 10.5 10*3/uL (ref 4.0–10.5)

## 2021-03-26 LAB — BASIC METABOLIC PANEL
BUN: 14 mg/dL (ref 6–23)
CO2: 31 mEq/L (ref 19–32)
Calcium: 8.9 mg/dL (ref 8.4–10.5)
Chloride: 96 mEq/L (ref 96–112)
Creatinine, Ser: 0.7 mg/dL (ref 0.40–1.50)
GFR: 87.06 mL/min (ref >60.00)
Glucose, Bld: 62 mg/dL — ABNORMAL LOW (ref 70–99)
Potassium: 4.2 mEq/L (ref 3.5–5.1)
Sodium: 134 mEq/L — ABNORMAL LOW (ref 135–145)

## 2021-03-26 NOTE — Progress Notes (Signed)
[facility] [address] [place] [address] Dept: [phone] Dept Fax: [phone]  Office Visit  Subjective:    Patient ID: [name], male    DOB: 08-22-1940, 80 y.o..   MRN: [id number]  Chief Complaint  Patient presents with   Hospitalization [facility] f/u from 03/10/21,  COPD.  Wants to get referral for Pulmonology (Dr Vella Kohler) and home health PT.     History of Present Illness:  Patient is in today for follow-up from his recent hospitalization. Mr. Goheen was admitted at [facility] from 12/12-12/17/2022. He has a history of chronic systolic heart failure, coronary artery disease, moderate aortic stenosis, history of rectal cancer with colostomy, and history of COPD. He was admitted to the hospital with about 2 weeks of shortness of breath and wheezing.   In the emergency room, he was tachypneic (O2 sat of 84% on room air).  His troponins were borderline, COVID and influenza negative, and CTA of the chest with no PE but evidence of COPD and mucous plugging with no pneumonia.  Mr. Kissner notes that he has felt improved since returning home. He remains on O2 at 4 lpm when out of the home, but a lower flow rate via an oxygen concentrator at home. He is using his albuterol inhaler as needed. He notes his lower leg edema has been improved. He has a follow-up appointment with the cardiology office on Friday. He was to be seen by pulmonology outpatient, but notes nothing was set up. His daughter-in-law is with him. She notes that there was also discussion during his hospitalization of home health-PT for strengthening. He lives at home with his wife, who also has health issues. He ambulates with a wheeled walker. His wife has limited ability to assist Mr. Megan Salon. He no longer drives.   Past Medical History: Patient Active Problem List   Diagnosis Date Noted   Heart failure with preserved ejection fraction ([place])     COPD with acute exacerbation ([place]) 03/10/2021   Moderate aortic stenosis    Actinic otitis externa 04/17/2019   Cerumen impaction 04/03/2019   Preventative health care 02/07/2019   Acute exacerbation of chronic obstructive pulmonary disease (COPD) ([place]) 06/14/2018   Prediabetes 09/10/2017   Trauma and stressor-related disorder 01/26/2017   Medicare annual wellness visit, subsequent 06/23/2016   Chronic hyponatremia 06/23/2016   Rash and nonspecific skin eruption 03/22/2014   Mitral regurgitation    Chronic obstructive pulmonary disease ([place]) 04/21/2013   Acute respiratory failure with hypoxia ([place]) 04/21/2013   CHF with cardiomyopathy ([place]) 04/21/2013   Coronary artery disease 35/57/3220   Chronic systolic heart failure ([place]) 03/30/2013   Insomnia 03/29/2013   Multifocal atrial tachycardia ([place]) 01/17/2013   Osteoarthritis of knees, bilateral 03/12/2011   Episodic mood disorder ([place]) 04/06/2008   Essential hypertension 03/06/2008   Nicotine dependence 03/10/2007   HLD (hyperlipidemia) 03/18/2006   Malignant neoplasm of rectum ([place]) 03/31/2003   Past Surgical History:  Procedure Laterality Date   APPENDECTOMY  06/2003   CARDIAC CATHETERIZATION  03/2013   [facility]   cataract [facility]     COLOSTOMY     HERNIA REPAIR     resection of rectum     Family History  Problem Relation Age of Onset   Heart disease Mother    Heart disease Father    Hyperlipidemia Father    Hypertension Father    Heart attack Brother 83       MI  Heart disease Sister        stents placed    Cancer - Cervical Sister    Heart disease Sister        CABG   Outpatient Medications Prior to Visit  Medication Sig Dispense Refill   albuterol (VENTOLIN HFA) 108 (90 Base) MCG/ACT inhaler INHALE 2 PUFFS BY MOUTH EVERY 6 HOURS AS NEEDED FOR SHORTNESS OF BREATH 18 g 0   Alirocumab (PRALUENT) 75 MG/ML SOAJ Inject 1 pen into the skin every 14 (fourteen) days. 2 pen 11   aspirin 81 MG tablet Take 81 mg by mouth  daily. Takes at night     carvedilol (COREG) 12.5 MG tablet TAKE 1 TABLET BY MOUTH TWICE DAILY 180 tablet 5   cholecalciferol (VITAMIN D3) 25 MCG (1000 UNIT) tablet Take 1,000 Units by mouth daily.     diclofenac Sodium (VOLTAREN) 1 % GEL APPLY 4 GRAMS TO AFFECTED AREA FOUR TIMES A DAY AS NEEDED FOR HIP PAIN-TOTAL DAILY DOSE SHOULD NOT EXCEED 32G PER DAY OVER ALL AFFECTEDJOINTS.FOR PAIN.     ezetimibe (ZETIA) 10 MG tablet Take 1 tablet by mouth once daily for cholesterol. NEED APPOINTMENT FOR ANY MORE REFILLS 90 tablet 1   fluticasone-salmeterol (ADVAIR) 250-50 MCG/ACT AEPB INHALE 1 PUFF FOR INHALATION TWICE DAILY COPD - RINSE MOUTH AFTER EACH USE -     furosemide (LASIX) 20 MG tablet TAKE 1 TABLET BY MOUTH ONCE DAILY. 30 tablet 11   guaiFENesin (ROBITUSSIN) 100 MG/5ML liquid Take 10 mLs by mouth every 4 (four) hours as needed for cough or to loosen phlegm. 120 mL 0   Magnesium Oxide 400 (240 Mg) MG TABS TAKE 1 TABLET BY MOUTH ONCE DAILY. 90 tablet 3   sacubitril-valsartan (ENTRESTO) 24-26 MG Take 1 tablet by mouth 2 (two) times daily.     traZODone (DESYREL) 50 MG tablet Take 1 tablet (50 mg total) by mouth at bedtime as needed. for sleep 90 tablet 0   rosuvastatin (CRESTOR) 40 MG tablet      No facility-administered medications prior to visit.   Allergies  Allergen Reactions   Codeine Nausea Only   Statins Other (See Comments)    REACTION: maylgias    Objective:   Today's Vitals   03/26/21 1130  BP: 106/62  Pulse: 91  Temp: (!) 97.5 F (36.4 C)  TempSrc: Temporal  SpO2: 99%  Weight: 166 lb 12.8 oz (75.7 kg)  Height: 5\' 6"  (1.676 m)   Body mass index is 26.92 kg/m.   General: Well developed, well nourished. No acute distress. Lungs: Breath sounds are very muffled. Appears to be moving air well. No audible wheezing,   rales or rhonchi. CV: Hear sounds are very distant. There is a possible murmur, but it is hard to characterize. Extremities:Knees are swollen bilateral with  moderate crepitance. Trace edema of lower legs   noted. Psych: Alert and oriented. Normal mood and affect.  Health Maintenance Due  Topic Date Due   TETANUS/TDAP  Never done   COVID-19 Vaccine (4 - Booster for Moderna series) 04/22/2020   Imaging: Echocardiogram (03/12/2021) IMPRESSIONS   1. Left ventricular ejection fraction, by estimation, is 55%. The left ventricle has normal function. The left ventricle has no regional wall motion abnormalities. There is mild left ventricular hypertrophy. Left ventricular diastolic parameters are consistent with Grade II diastolic dysfunction (pseudonormalization).   2. Right ventricular systolic function is normal. The right ventricular size is normal.   3. The mitral valve is normal in structure. Mild  mitral valve regurgitation.   4. Aortic valve peak gradient 50, mean gradient 34, DVI 0.23, Vmax 3.68m/s, AVA 0.75cm2. The aortic valve is calcified. Aortic valve regurgitation is not visualized. Moderate to severe aortic valve stenosis.   5. The inferior vena cava is normal in size with greater than 50% respiratory variability, suggesting right atrial pressure of 3 mmHg.    Assessment & Plan:   1. Chronic systolic heart failure (HCC) Stable on Entresto, carvedilol, and Lasix. I reminded Mr. Oatis to avoid NSAIDs. I will order follow-up labs as requested at discharge. He should keep his appointment with cardiology this week and follow-up with his PCP within a month.  - CBC - Basic metabolic panel - Ambulatory referral to [facility]  2. Chronic obstructive pulmonary disease, unspecified COPD type ([place]) O2 sat is good with supplemental oxygen. I will refer him to Dr. Raul Del as noted in the discharge plan.  - Ambulatory referral to Pulmonology - Ambulatory referral to [facility]  3. Primary osteoarthritis of both knees I agree that PT would be helpful for strengthening to improve ambulation and reduce fall risk. M.r Billy has been home bound  based on physical deconditioning, COPD and CHF.  - Ambulatory referral to Home Health  Haydee Salter, MD

## 2021-03-27 NOTE — Progress Notes (Signed)
Patient ID: Curtis Campos, male    DOB: 1940-06-27, 80 y.o.   MRN: [id number]  HPI  Curtis Campos is a 80 y/o male with a history of CAD, hyperlipidemia, HTN, agent orange exposure, COPD, moderate AS, rectal cancer and chronic heart failure.   Echo report from 03/12/21 reviewed and showed an EF of 55% along with mild LVH, mild Curtis and moderate/ severe AS.   Admitted 03/10/21 due to shortness of breath due to COPD/ HF exacerbation along with mucous plugging. Treated with bronchodilator therapy, mucolytic, chest physiotherapy. Cardiology consult obtained. Antibiotics given and diuresed. Discharged after 5 days.   He presents today for his initial visit with a chief complaint of moderate fatigue with little exertion. He describes this as chronic in nature having been present for several years. He has associated cough, shortness of breath, occasional palpitations & knee pain along with this. He denies any difficulty sleeping, dizziness, abdominal distention, pedal edema, chest pain or weight gain.   Says that his biggest complaint is of knee pain where he has "bone on bone". He's tried icy hot, voltaren gel and "everything else". Feels like if he could move around better, it would help.   Past Medical History:  Diagnosis Date   Agent orange exposure    Arthritis 03/12/2011   CHF (congestive heart failure) (HCC)    Chronic obstructive lung disease ([place])    Coronary artery disease 03/2013   a. 03/2013 Cath: LM nl, LAD 30m, D1 50, D2 30, LCX 27m/d (chronic), OM1 50, RCA 40p, 22m, RPL 40, RPDA 60, EF 25%, mod-sev Curtis-->Med mgmt; b. 08/2019 MV: basal inflat/mid inflat defect consistent w/ ischemia in LCX distribution. EF 37%. Intermediate risk.   HFrEF (heart failure with reduced ejection fraction) ([place])    a. 03/2013 TEE: EF 25%; b. 08/2017 Echo: EF 45-50%, Gr1 DD. Mod AS. Mod Curtis. Mildly dil LA; c. 06/2019 Echo: EF 30-35%, glob HK, gr1 DD; d. 06/2020 Echo: EF 55-60%, nl RV fxn, triv Curtis, Mod AS.    Hyperlipidemia    Hypertension    Ischemic cardiomyopathy    a. 03/2013 TEE: EF 25%; b. 08/2017 Echo: EF 45-50%; c. 06/2019 Echo: EF 30-35%; d. 06/2020 Echo: EF 55-60%.   Moderate aortic stenosis    a. 08/2017 Echo: Mod AS. Mean grad (S) 30mmHg. Valve area (VTI): 1.25cm^2; b. 06/2019 Echo: Mod AS. Mean grad 67mmHg, Valve area (VTI): 1.16cm^2); c. 06/2020 Mod AS. AVA 1.25cm^2, mean grad 39mmHg.   Moderate mitral regurgitation    a. 08/2017 Echo: mod Curtis; b. 06/2019 Echo: mild to mod Curtis.   Rectal cancer [facility])    Past Surgical History:  Procedure Laterality Date   APPENDECTOMY  06/2003   CARDIAC CATHETERIZATION  03/2013   [facility]   cataract surgery     COLOSTOMY     HERNIA REPAIR     resection of rectum     Family History  Problem Relation Age of Onset   Heart disease Mother    Heart disease Father    Hyperlipidemia Father    Hypertension Father    Heart attack Brother 9       MI   Heart disease Sister        stents placed    Cancer - Cervical Sister    Heart disease Sister        CABG   Social History   Tobacco Use   Smoking status: Former    Packs/day: 1.00    Years: 50.00  Pack years: 50.00    Types: Cigarettes    Quit date: 03/25/2013    Years since quitting: 8.0   Smokeless tobacco: Never  Substance Use Topics   Alcohol use: No    Alcohol/week: 0.0 standard drinks   Allergies  Allergen Reactions   Codeine Nausea Only   Statins Other (See Comments)    REACTION: maylgias   Prior to Admission medications   Medication Sig Start Date End Date Taking? Authorizing Provider  albuterol (VENTOLIN HFA) 108 (90 Base) MCG/ACT inhaler INHALE 2 PUFFS BY MOUTH EVERY 6 HOURS AS NEEDED FOR SHORTNESS OF BREATH 08/29/18  Yes [name], NP  Alirocumab (PRALUENT) 75 MG/ML SOAJ Inject 1 pen into the skin every 14 (fourteen) days. 08/25/18  Yes Wellington Hampshire, MD  aspirin 81 MG tablet Take 81 mg by mouth daily. Takes at night   Yes [provider]  carvedilol (COREG) 12.5  MG tablet TAKE 1 TABLET BY MOUTH TWICE DAILY 05/07/20  Yes Wellington Hampshire, MD  cholecalciferol (VITAMIN D3) 25 MCG (1000 UNIT) tablet Take 1,000 Units by mouth daily.   Yes [provider]  diclofenac Sodium (VOLTAREN) 1 % GEL APPLY 4 GRAMS TO AFFECTED AREA FOUR TIMES A DAY AS NEEDED FOR HIP PAIN-TOTAL DAILY DOSE SHOULD NOT EXCEED 32G PER DAY OVER ALL AFFECTEDJOINTS.FOR PAIN. 10/30/19  Yes [provider]  ezetimibe (ZETIA) 10 MG tablet Take 1 tablet by mouth once daily for cholesterol. NEED APPOINTMENT FOR ANY MORE REFILLS 08/29/18  Yes [name], NP  fluticasone-salmeterol (ADVAIR) 250-50 MCG/ACT AEPB INHALE 1 PUFF FOR INHALATION TWICE DAILY COPD - RINSE MOUTH AFTER EACH USE - 03/05/21  Yes [provider]  furosemide (LASIX) 20 MG tablet TAKE 1 TABLET BY MOUTH ONCE DAILY. 02/13/21  Yes Wellington Hampshire, MD  guaiFENesin (ROBITUSSIN) 100 MG/5ML liquid Take 10 mLs by mouth every 4 (four) hours as needed for cough or to loosen phlegm. 03/15/21  Yes Barb Merino, MD  Magnesium Oxide 400 (240 Mg) MG TABS TAKE 1 TABLET BY MOUTH ONCE DAILY. 05/03/17  Yes Wellington Hampshire, MD  sacubitril-valsartan (ENTRESTO) 24-26 MG Take 1 tablet by mouth 2 (two) times daily.   Yes [provider]  traZODone (DESYREL) 50 MG tablet Take 1 tablet (50 mg total) by mouth at bedtime as needed. for sleep 08/29/18  Yes [name], NP   Review of Systems  Constitutional:  Positive for fatigue (tire easily). Negative for appetite change.  HENT:  Negative for congestion, postnasal drip and sore throat.   Eyes: Negative.   Respiratory:  Positive for cough and shortness of breath (improving). Negative for chest tightness.   Cardiovascular:  Positive for palpitations (at times). Negative for chest pain and leg swelling.  Gastrointestinal:  Negative for abdominal distention and abdominal pain.  Endocrine: Negative.   Genitourinary: Negative.   Musculoskeletal:  Positive for  arthralgias (knees at time). Negative for back pain.  Skin: Negative.   Allergic/Immunologic: Negative.   Neurological:  Negative for dizziness and light-headedness.  Hematological:  Negative for adenopathy. Does not bruise/bleed easily.  Psychiatric/Behavioral:  Negative for dysphoric mood and sleep disturbance (sleeping on 2 pillows with oxygen). The patient is not nervous/anxious.    Vitals:   03/28/21 1257  BP: 94/67  Pulse: 87  Resp: 20  SpO2: 100%  Weight: 166 lb (75.3 kg)  Height: 5\' 6"  (1.676 m)   Wt Readings from Last 3 Encounters:  03/28/21 166 lb (75.3 kg)  03/26/21  166 lb 12.8 oz (75.7 kg)  03/11/21 164 lb 14.5 oz (74.8 kg)   Lab Results  Component Value Date   CREATININE 0.70 03/26/2021   CREATININE 0.62 03/14/2021   CREATININE 0.90 03/12/2021   Physical Exam Vitals and nursing note reviewed. Exam conducted with a chaperone present (daughter-in-law).  Constitutional:      Appearance: Normal appearance.  HENT:     Head: Normocephalic and atraumatic.  Cardiovascular:     Rate and Rhythm: Normal rate and regular rhythm.  Pulmonary:     Effort: Pulmonary effort is normal. No respiratory distress.     Breath sounds: No wheezing or rales.  Abdominal:     General: There is no distension.     Palpations: Abdomen is soft.  Musculoskeletal:        General: No tenderness.     Cervical back: Normal range of motion and neck supple.     Right lower leg: No edema.     Left lower leg: No edema.  Skin:    General: Skin is warm and dry.  Neurological:     General: No focal deficit present.     Mental Status: He is alert and oriented to person, place, and time.  Psychiatric:        Mood and Affect: Mood normal.        Behavior: Behavior normal.        Thought Content: Thought content normal.    Assessment & Plan:  1: Chronic heart failure with preserved ejection fraction with structural changes (LVH)- - NYHA class III - euvolemic today - weighing daily;  instructed to call for an overnight weight gain of > 2 pounds or a weekly weight gain of > 5 pounds - not adding salt and they try to be mindful of sodium content of foods; low sodium cookbook provided today - on GDMT of entresto - current BP will not allow for titration or addition of SGLT2 - BNP 03/11/21 was  - reports receiving his flu vaccine for this season  2: HTN- - BP low (94/67) but he's without dizziness; encouraged slow position changes - saw PCP ([facility]) 03/26/21 - BMP 03/26/21 reviewed and showed sodium 134, potassium 4.2, creatinine 0.7 and GFR 87.06  3: COPD- - wearing oxygen at 4L around the clock - pulmonology consult placed by PCP yesterday; main # to [facility] given so that patient can call next week to see about getting appointment - PT consult also placed by PCP yesterday  4: Moderate/severe AS- - saw cardiology Fletcher Anon) 02/11/21; returns 05/22/20 - has repeat echo scheduled for 05/21/20   Medication bottles reviewed.   Return in 4 months or sooner for any questions/problems before then.

## 2021-03-28 ENCOUNTER — Encounter: Payer: Self-pay | Admitting: Family

## 2021-03-28 ENCOUNTER — Ambulatory Visit: Payer: Medicare Other | Attending: Family | Admitting: Family

## 2021-03-28 ENCOUNTER — Other Ambulatory Visit: Payer: Self-pay

## 2021-03-28 VITALS — BP 94/67 | HR 87 | Resp 20 | Ht 66.0 in | Wt 166.0 lb

## 2021-03-28 DIAGNOSIS — I350 Nonrheumatic aortic (valve) stenosis: Secondary | ICD-10-CM | POA: Diagnosis not present

## 2021-03-28 DIAGNOSIS — Z85048 Personal history of other malignant neoplasm of rectum, rectosigmoid junction, and anus: Secondary | ICD-10-CM | POA: Diagnosis not present

## 2021-03-28 DIAGNOSIS — I2510 Atherosclerotic heart disease of native coronary artery without angina pectoris: Secondary | ICD-10-CM | POA: Diagnosis not present

## 2021-03-28 DIAGNOSIS — I10 Essential (primary) hypertension: Secondary | ICD-10-CM

## 2021-03-28 DIAGNOSIS — Z87891 Personal history of nicotine dependence: Secondary | ICD-10-CM | POA: Insufficient documentation

## 2021-03-28 DIAGNOSIS — I110 Hypertensive heart disease with heart failure: Secondary | ICD-10-CM | POA: Insufficient documentation

## 2021-03-28 DIAGNOSIS — M25569 Pain in unspecified knee: Secondary | ICD-10-CM | POA: Diagnosis not present

## 2021-03-28 DIAGNOSIS — I5032 Chronic diastolic (congestive) heart failure: Secondary | ICD-10-CM | POA: Diagnosis not present

## 2021-03-28 DIAGNOSIS — I5042 Chronic combined systolic (congestive) and diastolic (congestive) heart failure: Secondary | ICD-10-CM | POA: Insufficient documentation

## 2021-03-28 DIAGNOSIS — E785 Hyperlipidemia, unspecified: Secondary | ICD-10-CM | POA: Diagnosis not present

## 2021-03-28 DIAGNOSIS — J449 Chronic obstructive pulmonary disease, unspecified: Secondary | ICD-10-CM | POA: Diagnosis not present

## 2021-03-28 NOTE — Patient Instructions (Addendum)
Continue weighing daily and call for an overnight weight gain of 3 pounds or more or a weekly weight gain of more than 5 pounds.     [facility] pulmonology is [phone] (Dr Raul Del)

## 2021-04-17 ENCOUNTER — Other Ambulatory Visit: Payer: Self-pay | Admitting: Specialist

## 2021-04-17 DIAGNOSIS — R911 Solitary pulmonary nodule: Secondary | ICD-10-CM

## 2021-04-23 ENCOUNTER — Ambulatory Visit: Payer: Medicare Other | Admitting: Primary Care

## 2021-05-12 ENCOUNTER — Other Ambulatory Visit: Payer: Self-pay | Admitting: Cardiovascular Disease

## 2021-05-15 DIAGNOSIS — Z433 Encounter for attention to colostomy: Secondary | ICD-10-CM | POA: Insufficient documentation

## 2021-05-15 DIAGNOSIS — I700 Atherosclerosis of aorta: Secondary | ICD-10-CM | POA: Insufficient documentation

## 2021-05-15 DIAGNOSIS — I38 Endocarditis, valve unspecified: Secondary | ICD-10-CM | POA: Insufficient documentation

## 2021-05-21 ENCOUNTER — Other Ambulatory Visit: Payer: Self-pay

## 2021-05-21 ENCOUNTER — Ambulatory Visit (INDEPENDENT_AMBULATORY_CARE_PROVIDER_SITE_OTHER): Payer: Medicare Other

## 2021-05-21 DIAGNOSIS — I350 Nonrheumatic aortic (valve) stenosis: Secondary | ICD-10-CM | POA: Diagnosis not present

## 2021-05-21 LAB — ECHOCARDIOGRAM COMPLETE
AR max vel: 0.94 cm2
AV Area VTI: 0.79 cm2
AV Area mean vel: 0.87 cm2
AV Mean grad: 22 mmHg
AV Peak grad: 39.1 mmHg
Ao pk vel: 3.13 m/s
Area-P 1/2: 2.1 cm2
Calc EF: 42.8 %
S' Lateral: 4.3 cm
Single Plane A2C EF: 47.7 %
Single Plane A4C EF: 38.7 %

## 2021-05-21 NOTE — Progress Notes (Signed)
Cardiology Office Note:    Date:  05/22/2021   ID:  [name], DOB Mar 29, 1941, MRN [id number]  PCP:  Curtis Koch, NP  [facility] Cardiologist:  Curtis Sacramento, MD  [facility] Electrophysiologist:  None   Referring MD: Curtis Koch, NP   Chief Complaint: 3 month follow-up  History of Present Illness:    Curtis Campos is a 81 y.o. male with a hx of CAD, ischemic cardiomyopathy, HFrEF, moderate aortic stenosis, moderate mitral regurgitation, hypertension, hyperlipidemia with statin intolerance, remote tobacco abuse, and COPD who is being seen for follow-up.    In January 2015, he was diagnosed with cardiomyopathy after echocardiogram showed an EF of 25% with moderate mitral regurgitation.  Cardiac catheterization showed severe one-vessel CAD involving a subtotal occlusion of the left circumflex, which appear to be chronic.  He otherwise had nonobstructive disease.  There was concern for moderate to severe mitral regurgitation however, follow-up TEE showed only moderate mitral regurgitation and he has been medically managed.  EF improved to 45 to 50% by echo in June 2019 but was lower in April 2021 at 30 to 35%.  Myoview in June 2021 showed a medium defect of moderate severity in the basal inferolateral and mid inferolateral location consistent with ischemia in the circumflex distribution.  EF was 37%.  Given known occlusion of the circumflex and absence of chest pain, ongoing medical therapy was recommended.  His most recent echocardiogram was performed in April 2022 following initiation of Entresto.  This showed an EF of 55 to 60% with trivial MR and stable, moderate aortic stenosis (valve area 1.25 cm grade).  Due to hypotension, we had to reduce his Entresto to half a tablet twice a day (half of 49/51 mg tablets), and discontinue spironolactone.  Admitted 02/2021 for DOE treated for acute respirtoary failure, acute COPD exacerbation, acute CHF. He was diuresed. Plan  was for outpatient echo to evaluate AS/MR  Saw Curtis Campos in the interim. Was on Entresto, BP limiting SGLT2i. Sent to Pulmonology for COPD on 4 L O2.   Repeat echo 05/21/21 showed LVEF 40-45%, G1DD, moderately dilated LA, mild to mod MD, low flow severe AS. Echo was reviewed by MD, who felt AS was in the moderate range, and recommended echo in 6-12 month.    Today, the patient reports persistent shortness of breath since the hospitalization in December. Overall breathing is better. He is on 4L O2 only as needed, mostly needing it at night. No orthopnea or pnd. He has MSK chest wall pain from a mechanical fall on Monday. He walks with a cane and he was carrying a bag of groceries and fell to the ground. He fell on the left side of his chest and hip. He did not hit the head. He denies no pro-dromal symptoms prior to the fall.  He did not want to go to the ER and wait. Still has left sided pain.   He has follow-up with Curtis Campos in March.     Past Medical History:  Diagnosis Date   Agent orange exposure    Arthritis 03/12/2011   CHF (congestive heart failure) (HCC)    Chronic obstructive lung disease ([place])    Coronary artery disease 03/2013   a. 03/2013 Cath: LM nl, LAD 72m, D1 50, D2 30, LCX 53m/d (chronic), OM1 50, RCA 40p, 65m, RPL 40, RPDA 60, EF 25%, mod-sev MR-->Med mgmt; b. 08/2019 MV: basal inflat/mid inflat defect consistent w/ ischemia in LCX distribution. EF 37%.  Intermediate risk.   HFrEF (heart failure with reduced ejection fraction) ([place])    a. 03/2013 TEE: EF 25%; b. 08/2017 Echo: EF 45-50%, Gr1 DD. Mod AS. Mod MR. Mildly dil LA; c. 06/2019 Echo: EF 30-35%, glob HK, gr1 DD; d. 06/2020 Echo: EF 55-60%, nl RV fxn, triv MR, Mod AS.   Hyperlipidemia    Hypertension    Ischemic cardiomyopathy    a. 03/2013 TEE: EF 25%; b. 08/2017 Echo: EF 45-50%; c. 06/2019 Echo: EF 30-35%; d. 06/2020 Echo: EF 55-60%.   Moderate aortic stenosis    a. 08/2017 Echo: Mod AS. Mean grad (S) 29mmHg. Valve area  (VTI): 1.25cm^2; b. 06/2019 Echo: Mod AS. Mean grad 72mmHg, Valve area (VTI): 1.16cm^2); c. 06/2020 Mod AS. AVA 1.25cm^2, mean grad 27mmHg.   Moderate mitral regurgitation    a. 08/2017 Echo: mod MR; b. 06/2019 Echo: mild to mod MR.   Rectal cancer [facility])     Past Surgical History:  Procedure Laterality Date   APPENDECTOMY  06/2003   CARDIAC CATHETERIZATION  03/2013   [facility]   cataract surgery     COLOSTOMY     HERNIA REPAIR     resection of rectum      Current Medications: Current Meds  Medication Sig   albuterol (VENTOLIN HFA) 108 (90 Base) MCG/ACT inhaler INHALE 2 PUFFS BY MOUTH EVERY 6 HOURS AS NEEDED FOR SHORTNESS OF BREATH   Alirocumab (PRALUENT) 75 MG/ML SOAJ Inject 1 pen into the skin every 14 (fourteen) days.   aspirin 81 MG tablet Take 81 mg by mouth daily. Takes at night   carvedilol (COREG) 12.5 MG tablet TAKE 1 TABLET BY MOUTH TWICE DAILY   cholecalciferol (VITAMIN D3) 25 MCG (1000 UNIT) tablet Take 1,000 Units by mouth daily.   dapagliflozin propanediol (FARXIGA) 10 MG TABS tablet Take 1 tablet (10 mg total) by mouth daily before breakfast.   diclofenac Sodium (VOLTAREN) 1 % GEL APPLY 4 GRAMS TO AFFECTED AREA FOUR TIMES A DAY AS NEEDED FOR HIP PAIN-TOTAL DAILY DOSE SHOULD NOT EXCEED 32G PER DAY OVER ALL AFFECTEDJOINTS.FOR PAIN.   ezetimibe (ZETIA) 10 MG tablet Take 1 tablet by mouth once daily for cholesterol. NEED APPOINTMENT FOR ANY MORE REFILLS   fluticasone-salmeterol (ADVAIR) 250-50 MCG/ACT AEPB INHALE 1 PUFF FOR INHALATION TWICE DAILY COPD - RINSE MOUTH AFTER EACH USE -   furosemide (LASIX) 20 MG tablet TAKE 1 TABLET BY MOUTH ONCE DAILY.   guaiFENesin (ROBITUSSIN) 100 MG/5ML liquid Take 10 mLs by mouth every 4 (four) hours as needed for cough or to loosen phlegm.   Magnesium Oxide 400 (240 Mg) MG TABS TAKE 1 TABLET BY MOUTH ONCE DAILY.   sacubitril-valsartan (ENTRESTO) 24-26 MG Take 1 tablet by mouth 2 (two) times daily.   traZODone (DESYREL) 50 MG tablet Take 1 tablet  (50 mg total) by mouth at bedtime as needed. for sleep   TRELEGY ELLIPTA 100-62.5-25 MCG/ACT AEPB Inhale 1 puff into the lungs daily.     Allergies:   Codeine, Other, and Statins   Social History   Socioeconomic History   Marital status: Married    Spouse name: Not on file   Number of children: Not on file   Years of education: Not on file   Highest education level: Not on file  Occupational History   Not on file  Tobacco Use   Smoking status: Former    Packs/day: 1.00    Years: 50.00    Pack years: 50.00    Types: Cigarettes  Quit date: 03/25/2013    Years since quitting: 8.1   Smokeless tobacco: Never  Vaping Use   Vaping Use: Never used  Substance and Sexual Activity   Alcohol use: No    Alcohol/week: 0.0 standard drinks   Drug use: No   Sexual activity: Yes  Other Topics Concern   Not on file  Social History Narrative   Arm '66-'68, [place], agent orange exposure.    Social Determinants of Health   Financial Resource Strain: Not on file  Food Insecurity: Not on file  Transportation Needs: Not on file  Physical Activity: Not on file  Stress: Not on file  Social Connections: Not on file     Family History: The patient's family history includes Cancer - Cervical in his sister; Heart attack (age of onset: 43) in his brother; Heart disease in his father, mother, sister, and sister; Hyperlipidemia in his father; Hypertension in his father.  ROS:   Please see the history of present illness.     All other systems reviewed and are negative.  EKGs/Labs/Other Studies Reviewed:    The following studies were reviewed today:  Echo 05/21/21 1. Left ventricular ejection fraction, by estimation, is 40 to 45%. Left  ventricular ejection fraction by 2D MOD biplane is 42.8 %. The left  ventricle has mild to moderately decreased function. The left ventricle  has no regional wall motion  abnormalities. Left ventricular diastolic parameters are consistent with  Grade I  diastolic dysfunction (impaired relaxation).   2. Right ventricular systolic function is normal. The right ventricular  size is normal.   3. Left atrial size was moderately dilated.   4. The mitral valve is degenerative. Mild to moderate mitral valve  regurgitation. Moderate mitral annular calcification.   5. Aortic valve mean gradient 70mmHg, peak gradient 28mmHg, AVA 0.8cm2,  DVI 0.19. suggesting low flow-low gradient severe aortic stenosis.  consider low dose dobutamine study for further eval.. The aortic valve is  calcified. Aortic valve regurgitation is   not visualized. Severe aortic valve stenosis.   6. The inferior vena cava is normal in size with greater than 50%  respiratory variability, suggesting right atrial pressure of 3 mmHg.   Echo 03/12/21  1. Left ventricular ejection fraction, by estimation, is 55%. The left  ventricle has normal function. The left ventricle has no regional wall  motion abnormalities. There is mild left ventricular hypertrophy. Left  ventricular diastolic parameters are  consistent with Grade II diastolic dysfunction (pseudonormalization).   2. Right ventricular systolic function is normal. The right ventricular  size is normal.   3. The mitral valve is normal in structure. Mild mitral valve  regurgitation.   4. Aortic valve peak gradient 50, mean gradient 34, DVI 0.23, Vmax  3.1m/s, AVA 0.75cm2. The aortic valve is calcified. Aortic valve  regurgitation is not visualized. Moderate to severe aortic valve stenosis.   5. The inferior vena cava is normal in size with greater than 50%  respiratory variability, suggesting right atrial pressure of 3 mmHg.   Comparison(s): Previous AS pressure gradients were, 41mmHg max, 67mmHg  mean.   MPI 08/2019 Narrative & Impression  T wave inversion was noted during stress in the I, II, III, aVF and V6 leads. Downsloping ST segment depression ST segment depression was noted during stress in the V6 and II leads.  Nondiagnostic EKG with Lexiscan due to resting ST abnormalities. Defect 1: There is a medium defect of moderate severity present in the basal inferolateral and mid inferolateral  location. This is an intermediate risk study. Findings consistent with ischemia in the left circumflex distribution Nuclear stress EF: 37%.    EKG:  EKG is  ordered today.  The ekg ordered today demonstrates NSR, 68bpm, PAC, nonspecific T wave changes  Recent Labs: 03/11/2021: B Natriuretic Peptide 282.5 03/12/2021: Magnesium 2.4 03/26/2021: BUN 14; Creatinine, Ser 0.70; Hemoglobin 11.7; Platelets 330.0; Potassium 4.2; Sodium 134  Recent Lipid Panel    Component Value Date/Time   CHOL 185 02/12/2020 1454   TRIG 128.0 02/12/2020 1454   HDL 53.50 02/12/2020 1454   CHOLHDL 3 02/12/2020 1454   VLDL 25.6 02/12/2020 1454   LDLCALC 106 (H) 02/12/2020 1454   LDLDIRECT 129.7 04/14/2012 0756     Physical Exam:    VS:  BP 130/70 (BP Location: Left Arm, Patient Position: Sitting, Cuff Size: Normal)    Pulse 68    Ht 5\' 6"  (1.676 m)    Wt 174 lb 8 oz (79.2 kg)    SpO2 92%    BMI 28.17 kg/m     Wt Readings from Last 3 Encounters:  05/22/21 174 lb 8 oz (79.2 kg)  03/28/21 166 lb (75.3 kg)  03/26/21 166 lb 12.8 oz (75.7 kg)     GEN:  Well nourished, well developed in no acute distress HEENT: Normal NECK: No JVD; No carotid bruits LYMPHATICS: No lymphadenopathy CARDIAC: RRR, + murmur, no rubs, gallops RESPIRATORY:  Clear to auscultation without rales, wheezing or rhonchi  ABDOMEN: Soft, non-tender, non-distended MUSCULOSKELETAL:  No edema; No deformity  SKIN: Warm and dry NEUROLOGIC:  Alert and oriented x 3 PSYCHIATRIC:  Normal affect   ASSESSMENT:    1. Moderate aortic stenosis   2. Moderate mitral regurgitation   3. Ischemic cardiomyopathy   4. Chronic systolic heart failure ([place])   5. Hyperlipidemia LDL goal <70   6. Chronic obstructive pulmonary disease, unspecified COPD type ([place])   7.  Nonrheumatic mitral valve regurgitation   8. Accident due to mechanical fall without injury, initial encounter    PLAN:    In order of problems listed above:  Moderate to ?Severe AS History of ICM with improved EF 55-60% by echo in 06/2020. Most recent LVEF showed EF 45-50%, no WMA, G1DD, normal RV function, mild to mod MR, severe AS mean gradient 70mmHG. Echo reviewed by MD and it was felt AS was moderate, recommended for echo in 6-12 months. Patient reports breathing is overall improved, but still short at times. Long discussion regarding options. Daughter, who is present, would like referral to the structural heart team, however patient would like to wait at this time. We will see him back in 2-3 weeks and can re-visit at that time.   HFrEF ICM Most recent echo showed LVEF 45-50% as above. Patient appears euvolemic on exam. He is on lasix 20 mg daily. Continue Entresto and Coreg. I wil add Wilder Glade, BMET in a week. BP good today. Continue GDMT at follow-up.   Mild-mod MR Recent echo showed mild to mod MR. Follow with serial echocardiogram.   COPD Chronic respiratory failure Improving shortness of breath since hospitalization in December. He was sent home on 4L O2 and is needing it only at night. Still has some breathing issues at times.   HTN Orthostatic hypotension BP good today. Add Farxiga as above. Continue Entresto and Coreg. Monitor BP at home  HLD LDL 106 in 2021. Continue statin and Zetia.  Mechanical fall Recent mechanical fall, he fell on his left side to the  ground. Denies pro-dromal symptoms. Did not his his head, has some left sided chest wall pain from the fall. He did not want to go to the ER. Recommend PCP follow-up.   Disposition: Follow up in 2-3  week(s) with MD/APP     Signed, Meeya Goldin Ninfa Meeker, PA-C  05/22/2021 4:28 PM    [facility]

## 2021-05-22 ENCOUNTER — Ambulatory Visit: Payer: Medicare Other | Admitting: Medical

## 2021-05-22 ENCOUNTER — Encounter: Payer: Self-pay | Admitting: Medical

## 2021-05-22 VITALS — BP 130/70 | HR 68 | Ht 66.0 in | Wt 174.5 lb

## 2021-05-22 DIAGNOSIS — I340 Nonrheumatic mitral (valve) insufficiency: Secondary | ICD-10-CM | POA: Diagnosis not present

## 2021-05-22 DIAGNOSIS — I255 Ischemic cardiomyopathy: Secondary | ICD-10-CM | POA: Diagnosis not present

## 2021-05-22 DIAGNOSIS — E785 Hyperlipidemia, unspecified: Secondary | ICD-10-CM | POA: Diagnosis not present

## 2021-05-22 DIAGNOSIS — I350 Nonrheumatic aortic (valve) stenosis: Secondary | ICD-10-CM | POA: Diagnosis not present

## 2021-05-22 DIAGNOSIS — I5022 Chronic systolic (congestive) heart failure: Secondary | ICD-10-CM | POA: Diagnosis not present

## 2021-05-22 DIAGNOSIS — J449 Chronic obstructive pulmonary disease, unspecified: Secondary | ICD-10-CM

## 2021-05-22 DIAGNOSIS — W19XXXA Unspecified fall, initial encounter: Secondary | ICD-10-CM

## 2021-05-22 MED ORDER — DAPAGLIFLOZIN PROPANEDIOL 10 MG PO TABS: 10.0000 mg | ORAL_TABLET | Freq: Every day | ORAL | 11 refills | Status: DC

## 2021-05-22 NOTE — Patient Instructions (Addendum)
Medication Instructions:  Your physician has recommended you make the following change in your medication:   START Farxiga 10 mg once a day  *If you need a refill on your cardiac medications before your next appointment, please call your pharmacy*   Lab Work: BMET in one week here in our office.   If you have labs (blood work) drawn today and your tests are completely normal, you will receive your results only by: [place] (if you have MyChart) OR A paper copy in the mail If you have any lab test that is abnormal or we need to change your treatment, we will call you to review the results.   Testing/Procedures: None   Follow-Up: At [facility], you and your health needs are our priority.  As part of our continuing mission to provide you with exceptional heart care, we have created designated Provider Care Teams.  These Care Teams include your primary Cardiologist (physician) and Advanced Practice Providers (APPs -  Physician Assistants and Nurse Practitioners) who all work together to provide you with the care you need, when you need it.   Your next appointment:   2-3 week(s)  The format for your next appointment:   In Person  Provider:   Kathlyn Sacramento, MD or Cadence Kathlen Mody, [place]

## 2021-05-29 ENCOUNTER — Other Ambulatory Visit: Payer: Medicare Other

## 2021-05-30 ENCOUNTER — Other Ambulatory Visit (INDEPENDENT_AMBULATORY_CARE_PROVIDER_SITE_OTHER): Payer: Medicare Other

## 2021-05-30 ENCOUNTER — Other Ambulatory Visit: Payer: Self-pay

## 2021-05-30 DIAGNOSIS — I255 Ischemic cardiomyopathy: Secondary | ICD-10-CM

## 2021-05-30 DIAGNOSIS — I340 Nonrheumatic mitral (valve) insufficiency: Secondary | ICD-10-CM | POA: Diagnosis not present

## 2021-05-30 DIAGNOSIS — I5022 Chronic systolic (congestive) heart failure: Secondary | ICD-10-CM

## 2021-05-30 DIAGNOSIS — J449 Chronic obstructive pulmonary disease, unspecified: Secondary | ICD-10-CM

## 2021-05-30 DIAGNOSIS — E785 Hyperlipidemia, unspecified: Secondary | ICD-10-CM

## 2021-05-31 LAB — BASIC METABOLIC PANEL
BUN/Creatinine Ratio: 9 — ABNORMAL LOW (ref 10–24)
BUN: 8 mg/dL (ref 8–27)
CO2: 25 mmol/L (ref 20–29)
Calcium: 8.7 mg/dL (ref 8.6–10.2)
Chloride: 97 mmol/L (ref 96–106)
Creatinine, Ser: 0.85 mg/dL (ref 0.76–1.27)
Glucose: 171 mg/dL — ABNORMAL HIGH (ref 70–99)
Potassium: 4.1 mmol/L (ref 3.5–5.2)
Sodium: 135 mmol/L (ref 134–144)
eGFR: 88 mL/min/{1.73_m2} (ref >59)

## 2021-06-17 ENCOUNTER — Encounter: Payer: Self-pay | Admitting: Medical

## 2021-06-17 ENCOUNTER — Other Ambulatory Visit: Payer: Self-pay

## 2021-06-17 ENCOUNTER — Ambulatory Visit: Payer: Medicare Other | Admitting: Medical

## 2021-06-17 VITALS — BP 90/50 | HR 66 | Ht 66.0 in | Wt 173.0 lb

## 2021-06-17 DIAGNOSIS — I5022 Chronic systolic (congestive) heart failure: Secondary | ICD-10-CM | POA: Diagnosis not present

## 2021-06-17 DIAGNOSIS — I350 Nonrheumatic aortic (valve) stenosis: Secondary | ICD-10-CM | POA: Diagnosis not present

## 2021-06-17 DIAGNOSIS — I10 Essential (primary) hypertension: Secondary | ICD-10-CM

## 2021-06-17 DIAGNOSIS — I5020 Unspecified systolic (congestive) heart failure: Secondary | ICD-10-CM | POA: Diagnosis not present

## 2021-06-17 DIAGNOSIS — I255 Ischemic cardiomyopathy: Secondary | ICD-10-CM | POA: Diagnosis not present

## 2021-06-17 DIAGNOSIS — I959 Hypotension, unspecified: Secondary | ICD-10-CM

## 2021-06-17 DIAGNOSIS — J449 Chronic obstructive pulmonary disease, unspecified: Secondary | ICD-10-CM

## 2021-06-17 DIAGNOSIS — I340 Nonrheumatic mitral (valve) insufficiency: Secondary | ICD-10-CM

## 2021-06-17 MED ORDER — CARVEDILOL 6.25 MG PO TABS: 6.2500 mg | ORAL_TABLET | Freq: Two times a day (BID) | ORAL | 3 refills | Status: DC

## 2021-06-17 NOTE — Patient Instructions (Signed)
Medication Instructions:  ?- Your physician has recommended you make the following change in your medication:  ? ?1) DECREASE coreg (carvedilol) to 6.25 mg: ?- take 1 tablet by mouth TWICE daily (or every 12 hours)  ? ?*If you need a refill on your cardiac medications before your next appointment, please call your pharmacy* ? ? ?Lab Work: ? - none ordered ? ?If you have labs (blood work) drawn today and your tests are completely normal, you will receive your results only by: ?MyChart Message (if you have MyChart) OR ?A paper copy in the mail ?If you have any lab test that is abnormal or we need to change your treatment, we will call you to review the results. ? ? ?Testing/Procedures: ? ?1) Echocardiogram (in August 2023): ?- Your physician has requested that you have an echocardiogram. Echocardiography is a painless test that uses sound waves to create images of your heart. It provides your doctor with information about the size and shape of your heart and how well your heart?s chambers and valves are working. This procedure takes approximately one hour. There are no restrictions for this procedure. There is a possibility that an IV may need to be started during your test to inject an image enhancing agent. This is done to obtain more optimal pictures of your heart. Therefore we ask that you do at least drink some water prior to coming in to hydrate your veins.  ? ? ? ?Follow-Up: ?At [facility], you and your health needs are our priority.  As part of our continuing mission to provide you with exceptional heart care, we have created designated Provider Care Teams.  These Care Teams include your primary Cardiologist (physician) and Advanced Practice Providers (APPs -  Physician Assistants and Nurse Practitioners) who all work together to provide you with the care you need, when you need it. ? ?We recommend signing up for the patient portal called "MyChart".  Sign up information is provided on this After Visit  Summary.  MyChart is used to connect with patients for Virtual Visits (Telemedicine).  Patients are able to view lab/test results, encounter notes, upcoming appointments, etc.  Non-urgent messages can be sent to your provider as well.   ?To learn more about what you can do with MyChart, go to [email or web address].   ? ?Your next appointment:   ?3 month(s) ? ?The format for your next appointment:   ?In Person ? ?Provider:   ?You may see Kathlyn Sacramento, MD or one of the following Advanced Practice Providers on your designated Care Team:   ? ?Cadence Kathlen Mody, PA-C  ? ? ?Other Instructions ? ?Echocardiogram ?An echocardiogram is a test that uses sound waves (ultrasound) to produce images of the heart. ?Images from an echocardiogram can provide important information about: ?Heart size and shape. ?The size and thickness and movement of your heart's walls. ?Heart muscle function and strength. ?Heart valve function or if you have stenosis. Stenosis is when the heart valves are too narrow. ?If blood is flowing backward through the heart valves (regurgitation). ?A tumor or infectious growth around the heart valves. ?Areas of heart muscle that are not working well because of poor blood flow or injury from a heart attack. ?Aneurysm detection. An aneurysm is a weak or damaged part of an artery wall. The wall bulges out from the normal force of blood pumping through the body. ?Tell a health care provider about: ?Any allergies you have. ?All medicines you are taking, including vitamins, herbs, eye drops, creams, and  over-the-counter medicines. ?Any blood disorders you have. ?Any surgeries you have had. ?Any medical conditions you have. ?Whether you are pregnant or may be pregnant. ?What are the risks? ?Generally, this is a safe test. However, problems may occur, including an allergic reaction to dye (contrast) that may be used during the test. ?What happens before the test? ?No specific preparation is needed. You may eat and  drink normally. ?What happens during the test? ? ?You will take off your clothes from the waist up and put on a hospital gown. ?Electrodes or electrocardiogram (ECG)patches may be placed on your chest. The electrodes or patches are then connected to a device that monitors your heart rate and rhythm. ?You will lie down on a table for an ultrasound exam. A gel will be applied to your chest to help sound waves pass through your skin. ?A handheld device, called a transducer, will be pressed against your chest and moved over your heart. The transducer produces sound waves that travel to your heart and bounce back (or "echo" back) to the transducer. These sound waves will be captured in real-time and changed into images of your heart that can be viewed on a video monitor. The images will be recorded on a computer and reviewed by your health care provider. ?You may be asked to change positions or hold your breath for a short time. This makes it easier to get different views or better views of your heart. ?In some cases, you may receive contrast through an IV in one of your veins. This can improve the quality of the pictures from your heart. ?The procedure may vary among health care providers and hospitals. ?What can I expect after the test? ?You may return to your normal, everyday life, including diet, activities, and medicines, unless your health care provider tells you not to do that. ?Follow these instructions at home: ?It is up to you to get the results of your test. Ask your health care provider, or the department that is doing the test, when your results will be ready. ?Keep all follow-up visits. This is important. ?Summary ?An echocardiogram is a test that uses sound waves (ultrasound) to produce images of the heart. ?Images from an echocardiogram can provide important information about the size and shape of your heart, heart muscle function, heart valve function, and other possible heart problems. ?You do not need  to do anything to prepare before this test. You may eat and drink normally. ?After the echocardiogram is completed, you may return to your normal, everyday life, unless your health care provider tells you not to do that. ?This information is not intended to replace advice given to you by your health care provider. Make sure you discuss any questions you have with your health care provider. ?Document Revised: 11/27/2020 Document Reviewed: 11/07/2019 ?Elsevier Patient Education ? 2022 [employer]. ? ? ?

## 2021-06-17 NOTE — Progress Notes (Signed)
?Cardiology Office Note:   ? ?Date:  06/17/2021  ? ?ID:  Curtis Campos, DOB 08-27-1940, MRN [id number] ? ?PCP:  [name], NP  ?[facility] Cardiologist:  Kathlyn Sacramento, MD  ?[facility] Electrophysiologist:  None  ? ?Referring MD: [name], NP  ? ?Chief Complaint: 2-3 week follow-up ? ?History of Present Illness:   ? ?Curtis Campos is a 81 y.o. male with a hx of with a hx of CAD, ischemic cardiomyopathy, HFrEF, moderate aortic stenosis, moderate mitral regurgitation, hypertension, hyperlipidemia with statin intolerance, remote tobacco abuse, and COPD who is being seen for follow-up.  ?  ? In January 2015, he was diagnosed with cardiomyopathy after echocardiogram showed an EF of 25% with moderate mitral regurgitation.  Cardiac catheterization showed severe one-vessel CAD involving a subtotal occlusion of the left circumflex, which appear to be chronic.  He otherwise had nonobstructive disease.  There was concern for moderate to severe mitral regurgitation however, follow-up TEE showed only moderate mitral regurgitation and he has been medically managed.  EF improved to 45 to 50% by echo in June 2019 but was lower in April 2021 at 30 to 35%.  Myoview in June 2021 showed a medium defect of moderate severity in the basal inferolateral and mid inferolateral location consistent with ischemia in the circumflex distribution.  EF was 37%.  Given known occlusion of the circumflex and absence of chest pain, ongoing medical therapy was recommended.  His most recent echocardiogram was performed in April 2022 following initiation of Entresto.  This showed an EF of 55 to 60% with trivial MR and stable, moderate aortic stenosis (valve area 1.25 cm grade).  Due to hypotension, we had to reduce his Entresto to half a tablet twice a day (half of 49/51 mg tablets), and discontinue spironolactone. ?  ?Admitted 02/2021 for DOE treated for acute respirtoary failure, acute COPD exacerbation, acute CHF. He was  diuresed. Plan was for outpatient echo to evaluate AS/MR ?  ?Saw Jackelyn Hoehn in the interim. Was on Entresto, BP limiting SGLT2i. Sent to Pulmonology for COPD on 4 L O2.  ?  ?Repeat echo 05/21/21 showed LVEF 40-45%, G1DD, moderately dilated LA, mild to mod MD, low flow severe AS. Echo was reviewed by MD, who felt AS was in the moderate range, and recommended echo in 6-12 month.   ? ?Seen 05/22/21 and reported persistent SOB. Also reported mechanical fall. Given progressive AS a referral for structural heart team was given. Wilder Glade was added.  ? ?Today, the patient reports he has been doing well. BP is low. At home he reports BP 120/70s. He has fatigue but says this is normal. No dizziness or lightheadedness. No chest pain. Some shortness of breath, but also chronic. No chest pain or LLE.  ?  ? ?Past Medical History:  ?Diagnosis Date  ? Agent orange exposure   ? Arthritis 03/12/2011  ? CHF (congestive heart failure) ([place])   ? Chronic obstructive lung disease ([place])   ? Coronary artery disease 03/2013  ? a. 03/2013 Cath: LM nl, LAD 19m, D1 50, D2 30, LCX 68m/d (chronic), OM1 50, RCA 40p, 29m, RPL 40, RPDA 60, EF 25%, mod-sev MR-->Med mgmt; b. 08/2019 MV: basal inflat/mid inflat defect consistent w/ ischemia in LCX distribution. EF 37%. Intermediate risk.  ? HFrEF (heart failure with reduced ejection fraction) ([place])   ? a. 03/2013 TEE: EF 25%; b. 08/2017 Echo: EF 45-50%, Gr1 DD. Mod AS. Mod MR. Mildly dil LA; c. 06/2019 Echo: EF  30-35%, glob HK, gr1 DD; d. 06/2020 Echo: EF 55-60%, nl RV fxn, triv MR, Mod AS.  ? Hyperlipidemia   ? Hypertension   ? Ischemic cardiomyopathy   ? a. 03/2013 TEE: EF 25%; b. 08/2017 Echo: EF 45-50%; c. 06/2019 Echo: EF 30-35%; d. 06/2020 Echo: EF 55-60%.  ? Moderate aortic stenosis   ? a. 08/2017 Echo: Mod AS. Mean grad (S) 56mmHg. Valve area (VTI): 1.25cm^2; b. 06/2019 Echo: Mod AS. Mean grad 49mmHg, Valve area (VTI): 1.16cm^2); c. 06/2020 Mod AS. AVA 1.25cm^2, mean grad 8mmHg.  ? Moderate mitral  regurgitation   ? a. 08/2017 Echo: mod MR; b. 06/2019 Echo: mild to mod MR.  ? Rectal cancer ([place])   ? ? ?Past Surgical History:  ?Procedure Laterality Date  ? APPENDECTOMY  06/2003  ? CARDIAC CATHETERIZATION  03/2013  ? [facility]  ? cataract surgery    ? COLOSTOMY    ? HERNIA REPAIR    ? resection of rectum    ? ? ?Current Medications: ?Current Meds  ?Medication Sig  ? albuterol (VENTOLIN HFA) 108 (90 Base) MCG/ACT inhaler INHALE 2 PUFFS BY MOUTH EVERY 6 HOURS AS NEEDED FOR SHORTNESS OF BREATH  ? Alirocumab (PRALUENT) 75 MG/ML SOAJ Inject 1 pen into the skin every 14 (fourteen) days.  ? aspirin 81 MG tablet Take 81 mg by mouth daily. Takes at night  ? carvedilol (COREG) 6.25 MG tablet Take 1 tablet (6.25 mg total) by mouth 2 (two) times daily.  ? cholecalciferol (VITAMIN D3) 25 MCG (1000 UNIT) tablet Take 1,000 Units by mouth daily.  ? dapagliflozin propanediol (FARXIGA) 10 MG TABS tablet Take 1 tablet (10 mg total) by mouth daily before breakfast.  ? diclofenac Sodium (VOLTAREN) 1 % GEL APPLY 4 GRAMS TO AFFECTED AREA FOUR TIMES A DAY AS NEEDED FOR HIP PAIN-TOTAL DAILY DOSE SHOULD NOT EXCEED 32G PER DAY OVER ALL AFFECTEDJOINTS.FOR PAIN.  ? ezetimibe (ZETIA) 10 MG tablet Take 1 tablet by mouth once daily for cholesterol. NEED APPOINTMENT FOR ANY MORE REFILLS  ? fluticasone-salmeterol (ADVAIR) 250-50 MCG/ACT AEPB INHALE 1 PUFF FOR INHALATION TWICE DAILY COPD - RINSE MOUTH AFTER EACH USE -  ? furosemide (LASIX) 20 MG tablet TAKE 1 TABLET BY MOUTH ONCE DAILY.  ? guaiFENesin (ROBITUSSIN) 100 MG/5ML liquid Take 10 mLs by mouth every 4 (four) hours as needed for cough or to loosen phlegm.  ? Magnesium Oxide 400 (240 Mg) MG TABS TAKE 1 TABLET BY MOUTH ONCE DAILY.  ? sacubitril-valsartan (ENTRESTO) 24-26 MG Take 1 tablet by mouth 2 (two) times daily.  ? traZODone (DESYREL) 50 MG tablet Take 1 tablet (50 mg total) by mouth at bedtime as needed. for sleep  ? TRELEGY ELLIPTA 100-62.5-25 MCG/ACT AEPB Inhale 1 puff into the lungs daily.   ? [DISCONTINUED] carvedilol (COREG) 12.5 MG tablet TAKE 1 TABLET BY MOUTH TWICE DAILY  ?  ? ?Allergies:   Codeine, Other, and Statins  ? ?Social History  ? ?Socioeconomic History  ? Marital status: Married  ?  Spouse name: Not on file  ? Number of children: Not on file  ? Years of education: Not on file  ? Highest education level: Not on file  ?Occupational History  ? Not on file  ?Tobacco Use  ? Smoking status: Former  ?  Packs/day: 1.00  ?  Years: 50.00  ?  Pack years: 50.00  ?  Types: Cigarettes  ?  Quit date: 03/25/2013  ?  Years since quitting: 8.2  ? Smokeless tobacco: Never  ?  Vaping Use  ? Vaping Use: Never used  ?Substance and Sexual Activity  ? Alcohol use: No  ?  Alcohol/week: 0.0 standard drinks  ? Drug use: No  ? Sexual activity: Yes  ?Other Topics Concern  ? Not on file  ?Social History Narrative  ? Arm '66-'68, [place], agent orange exposure.   ? ?Social Determinants of Health  ? ?Financial Resource Strain: Not on file  ?Food Insecurity: Not on file  ?Transportation Needs: Not on file  ?Physical Activity: Not on file  ?Stress: Not on file  ?Social Connections: Not on file  ?  ? ?Family History: ?The patient's family history includes Cancer - Cervical in his sister; Heart attack (age of onset: 60) in his brother; Heart disease in his father, mother, sister, and sister; Hyperlipidemia in his father; Hypertension in his father. ? ?ROS:   ?Please see the history of present illness.    ? All other systems reviewed and are negative. ? ?EKGs/Labs/Other Studies Reviewed:   ? ?The following studies were reviewed today: ? ?Echo 05/21/21 ?1. Left ventricular ejection fraction, by estimation, is 40 to 45%. Left  ?ventricular ejection fraction by 2D MOD biplane is 42.8 %. The left  ?ventricle has mild to moderately decreased function. The left ventricle  ?has no regional wall motion  ?abnormalities. Left ventricular diastolic parameters are consistent with  ?Grade I diastolic dysfunction (impaired relaxation).  ?  2. Right ventricular systolic function is normal. The right ventricular  ?size is normal.  ? 3. Left atrial size was moderately dilated.  ? 4. The mitral valve is degenerative. Mild to moderate mitral valve  ?

## 2021-06-18 ENCOUNTER — Other Ambulatory Visit: Payer: Self-pay | Admitting: Emergency Medicine

## 2021-06-18 DIAGNOSIS — I350 Nonrheumatic aortic (valve) stenosis: Secondary | ICD-10-CM

## 2021-06-27 ENCOUNTER — Ambulatory Visit: Payer: Medicare Other | Admitting: Family

## 2021-06-30 ENCOUNTER — Ambulatory Visit (INDEPENDENT_AMBULATORY_CARE_PROVIDER_SITE_OTHER): Payer: Medicare Other | Admitting: Cardiovascular Disease

## 2021-06-30 ENCOUNTER — Encounter: Payer: Self-pay | Admitting: Cardiovascular Disease

## 2021-06-30 VITALS — BP 100/60 | HR 50 | Ht 66.0 in | Wt 171.8 lb

## 2021-06-30 DIAGNOSIS — I350 Nonrheumatic aortic (valve) stenosis: Secondary | ICD-10-CM

## 2021-06-30 NOTE — Patient Instructions (Incomplete)
Curtis Campos , ?Thank you for taking time to come for your Medicare Wellness Visit. I appreciate your ongoing commitment to your health goals. Please review the following plan we discussed and let me know if I can assist you in the future.  ? ?Screening recommendations/referrals: ?Colonoscopy: No longer required ?Recommended yearly ophthalmology/optometry visit for glaucoma screening and checkup ?Recommended yearly dental visit for hygiene and checkup ? ?Vaccinations: ?Influenza vaccine: Done 01/13/2021 - Repeat annually ?Pneumococcal vaccine: Done 01/29/2014 & 06/21/2019 ?Tdap vaccine: Due - recommended every 10 years ?Shingles vaccine: Done 06/21/2019 & 10/04/2019   ?Covid-19: Done  04/12/2019, 05/10/2019, & 02/26/2020 ? ?Advanced directives: *** ? ?Conditions/risks identified: *** ? ?Next appointment: Follow up in one year for your annual wellness visit. *** ? ?Preventive Care 45 Years and Older, Male ? ?Preventive care refers to lifestyle choices and visits with your health care provider that can promote health and wellness. ?What does preventive care include? ?A yearly physical exam. This is also called an annual well check. ?Dental exams once or twice a year. ?Routine eye exams. Ask your health care provider how often you should have your eyes checked. ?Personal lifestyle choices, including: ?Daily care of your teeth and gums. ?Regular physical activity. ?Eating a healthy diet. ?Avoiding tobacco and drug use. ?Limiting alcohol use. ?Practicing safe sex. ?Taking low doses of aspirin every day. ?Taking vitamin and mineral supplements as recommended by your health care provider. ?What happens during an annual well check? ?The services and screenings done by your health care provider during your annual well check will depend on your age, overall health, lifestyle risk factors, and family history of disease. ?Counseling  ?Your health care provider may ask you questions about your: ?Alcohol use. ?Tobacco use. ?Drug  use. ?Emotional well-being. ?Home and relationship well-being. ?Sexual activity. ?Eating habits. ?History of falls. ?Memory and ability to understand (cognition). ?Work and work Statistician. ?Screening  ?You may have the following tests or measurements: ?Height, weight, and BMI. ?Blood pressure. ?Lipid and cholesterol levels. These may be checked every 5 years, or more frequently if you are over 33 years old. ?Skin check. ?Lung cancer screening. You may have this screening every year starting at age 23 if you have a 30-pack-year history of smoking and currently smoke or have quit within the past 15 years. ?Fecal occult blood test (FOBT) of the stool. You may have this test every year starting at age 44. ?Flexible sigmoidoscopy or colonoscopy. You may have a sigmoidoscopy every 5 years or a colonoscopy every 10 years starting at age 30. ?Prostate cancer screening. Recommendations will vary depending on your family history and other risks. ?Hepatitis C blood test. ?Hepatitis B blood test. ?Sexually transmitted disease (STD) testing. ?Diabetes screening. This is done by checking your blood sugar (glucose) after you have not eaten for a while (fasting). You may have this done every 1-3 years. ?Abdominal aortic aneurysm (AAA) screening. You may need this if you are a current or former smoker. ?Osteoporosis. You may be screened starting at age 102 if you are at high risk. ?Talk with your health care provider about your test results, treatment options, and if necessary, the need for more tests. ?Vaccines  ?Your health care provider may recommend certain vaccines, such as: ?Influenza vaccine. This is recommended every year. ?Tetanus, diphtheria, and acellular pertussis (Tdap, Td) vaccine. You may need a Td booster every 10 years. ?Zoster vaccine. You may need this after age 57. ?Pneumococcal 13-valent conjugate (PCV13) vaccine. One dose is recommended after age 64. ?  Pneumococcal polysaccharide (PPSV23) vaccine. One dose is  recommended after age 72. ?Talk to your health care provider about which screenings and vaccines you need and how often you need them. ?This information is not intended to replace advice given to you by your health care provider. Make sure you discuss any questions you have with your health care provider. ?Document Released: 04/12/2015 Document Revised: 12/04/2015 Document Reviewed: 01/15/2015 ?Elsevier Interactive Patient Education ? 2017 [place]. ? ?Fall Prevention in the Home ?Falls can cause injuries. They can happen to people of all ages. There are many things you can do to make your home safe and to help prevent falls. ?What can I do on the outside of my home? ?Regularly fix the edges of walkways and driveways and fix any cracks. ?Remove anything that might make you trip as you walk through a door, such as a raised step or threshold. ?Trim any bushes or trees on the path to your home. ?Use bright outdoor lighting. ?Clear any walking paths of anything that might make someone trip, such as rocks or tools. ?Regularly check to see if handrails are loose or broken. Make sure that both sides of any steps have handrails. ?Any raised decks and porches should have guardrails on the edges. ?Have any leaves, snow, or ice cleared regularly. ?Use sand or salt on walking paths during winter. ?Clean up any spills in your garage right away. This includes oil or grease spills. ?What can I do in the bathroom? ?Use night lights. ?Install grab bars by the toilet and in the tub and shower. Do not use towel bars as grab bars. ?Use non-skid mats or decals in the tub or shower. ?If you need to sit down in the shower, use a plastic, non-slip stool. ?Keep the floor dry. Clean up any water that spills on the floor as soon as it happens. ?Remove soap buildup in the tub or shower regularly. ?Attach bath mats securely with double-sided non-slip rug tape. ?Do not have throw rugs and other things on the floor that can make you  trip. ?What can I do in the bedroom? ?Use night lights. ?Make sure that you have a light by your bed that is easy to reach. ?Do not use any sheets or blankets that are too big for your bed. They should not hang down onto the floor. ?Have a firm chair that has side arms. You can use this for support while you get dressed. ?Do not have throw rugs and other things on the floor that can make you trip. ?What can I do in the kitchen? ?Clean up any spills right away. ?Avoid walking on wet floors. ?Keep items that you use a lot in easy-to-reach places. ?If you need to reach something above you, use a strong step stool that has a grab bar. ?Keep electrical cords out of the way. ?Do not use floor polish or wax that makes floors slippery. If you must use wax, use non-skid floor wax. ?Do not have throw rugs and other things on the floor that can make you trip. ?What can I do with my stairs? ?Do not leave any items on the stairs. ?Make sure that there are handrails on both sides of the stairs and use them. Fix handrails that are broken or loose. Make sure that handrails are as long as the stairways. ?Check any carpeting to make sure that it is firmly attached to the stairs. Fix any carpet that is loose or worn. ?Avoid having throw rugs at the  top or bottom of the stairs. If you do have throw rugs, attach them to the floor with carpet tape. ?Make sure that you have a light switch at the top of the stairs and the bottom of the stairs. If you do not have them, ask someone to add them for you. ?What else can I do to help prevent falls? ?Wear shoes that: ?Do not have high heels. ?Have rubber bottoms. ?Are comfortable and fit you well. ?Are closed at the toe. Do not wear sandals. ?If you use a stepladder: ?Make sure that it is fully opened. Do not climb a closed stepladder. ?Make sure that both sides of the stepladder are locked into place. ?Ask someone to hold it for you, if possible. ?Clearly mark and make sure that you can  see: ?Any grab bars or handrails. ?First and last steps. ?Where the edge of each step is. ?Use tools that help you move around (mobility aids) if they are needed. These include: ?Canes. ?Walkers. ?Scooters. ?Crutches. ?Turn o

## 2021-06-30 NOTE — Progress Notes (Signed)
?Cardiology Office Note:   ? ?Date:  06/30/2021  ? ?ID:  Curtis Campos, DOB 1940-10-27, MRN [id number] ? ?PCP:  [name], NP ?  ?[facility] Providers ?Cardiologist:  Kathlyn [place], MD    ? ?Referring MD: [name], NP  ? ?Chief Complaint  ?Patient presents with  ? Shortness of Breath  ? ? ?History of Present Illness:   ? ?Curtis Campos is a 81 y.o. male referred by Dr Fletcher Anon for evaluation of severe aortic stenosis.  ? ?He is here with his wife and daughter today. He now uses O2 at home every night. He was a longtime smoker but quit several years ago. He's had a heart murmur for about 20 years and has been aware of his aortic stenosis for a few years.  ? ?The patient has had problems with arthritis now for a long time.  He is able to ambulate only very short distances with a cane or walker.  He is in a wheelchair when he has to go any significant distance.  He does complain of exertional dyspnea with low-level activity.  He has an intermittent cough.  No orthopnea, PND, or complaints of leg swelling.  No recent episodes of chest pain or pressure.  In reviewing his chart, he was hospitalized in December 2022 with COPD exacerbation.  He otherwise has not had any recent hospitalizations and specifically has not had any heart failure hospitalizations in the past few years.  In reviewing his past echo studies, he has had moderate aortic stenosis dating back to 2017.  The patient's LVEF has been as low as 30 to 35%, then improved to 55% in 2022.  LVEF has declined again, now in the 40 to 45% range.  His recent echocardiogram in February showed a mean transaortic gradient of 22 mmHg, peak gradient 36 mmHg, dimensionless index 0.24, and calculated aortic valve area of 0.81 cm?. ? ?Past Medical History:  ?Diagnosis Date  ? Agent orange exposure   ? Arthritis 03/12/2011  ? CHF (congestive heart failure) ([place])   ? Chronic obstructive lung disease ([place])   ? Coronary artery disease 03/2013  ? a. 03/2013  Cath: LM nl, LAD 32m, D1 50, D2 30, LCX 29m/d (chronic), OM1 50, RCA 40p, 31m, RPL 40, RPDA 60, EF 25%, mod-sev MR-->Med mgmt; b. 08/2019 MV: basal inflat/mid inflat defect consistent w/ ischemia in LCX distribution. EF 37%. Intermediate risk.  ? HFrEF (heart failure with reduced ejection fraction) ([place])   ? a. 03/2013 TEE: EF 25%; b. 08/2017 Echo: EF 45-50%, Gr1 DD. Mod AS. Mod MR. Mildly dil LA; c. 06/2019 Echo: EF 30-35%, glob HK, gr1 DD; d. 06/2020 Echo: EF 55-60%, nl RV fxn, triv MR, Mod AS.  ? Hyperlipidemia   ? Hypertension   ? Ischemic cardiomyopathy   ? a. 03/2013 TEE: EF 25%; b. 08/2017 Echo: EF 45-50%; c. 06/2019 Echo: EF 30-35%; d. 06/2020 Echo: EF 55-60%.  ? Moderate aortic stenosis   ? a. 08/2017 Echo: Mod AS. Mean grad (S) 82mmHg. Valve area (VTI): 1.25cm^2; b. 06/2019 Echo: Mod AS. Mean grad 32mmHg, Valve area (VTI): 1.16cm^2); c. 06/2020 Mod AS. AVA 1.25cm^2, mean grad 23mmHg.  ? Moderate mitral regurgitation   ? a. 08/2017 Echo: mod MR; b. 06/2019 Echo: mild to mod MR.  ? Rectal cancer ([place])   ? ? ?Past Surgical History:  ?Procedure Laterality Date  ? APPENDECTOMY  06/2003  ? CARDIAC CATHETERIZATION  03/2013  ? [facility]  ? cataract surgery    ?  COLOSTOMY    ? HERNIA REPAIR    ? resection of rectum    ? ? ?Current Medications: ?Current Meds  ?Medication Sig  ? albuterol (VENTOLIN HFA) 108 (90 Base) MCG/ACT inhaler INHALE 2 PUFFS BY MOUTH EVERY 6 HOURS AS NEEDED FOR SHORTNESS OF BREATH  ? Alirocumab (PRALUENT) 75 MG/ML SOAJ Inject 1 pen into the skin every 14 (fourteen) days.  ? aspirin 81 MG tablet Take 81 mg by mouth daily. Takes at night  ? carvedilol (COREG) 6.25 MG tablet Take 1 tablet (6.25 mg total) by mouth 2 (two) times daily.  ? cholecalciferol (VITAMIN D3) 25 MCG (1000 UNIT) tablet Take 1,000 Units by mouth daily.  ? dapagliflozin propanediol (FARXIGA) 10 MG TABS tablet Take 1 tablet (10 mg total) by mouth daily before breakfast.  ? diclofenac Sodium (VOLTAREN) 1 % GEL APPLY 4 GRAMS TO AFFECTED AREA FOUR  TIMES A DAY AS NEEDED FOR HIP PAIN-TOTAL DAILY DOSE SHOULD NOT EXCEED 32G PER DAY OVER ALL AFFECTEDJOINTS.FOR PAIN.  ? ezetimibe (ZETIA) 10 MG tablet Take 1 tablet by mouth once daily for cholesterol. NEED APPOINTMENT FOR ANY MORE REFILLS  ? furosemide (LASIX) 20 MG tablet TAKE 1 TABLET BY MOUTH ONCE DAILY.  ? Magnesium Oxide 400 (240 Mg) MG TABS TAKE 1 TABLET BY MOUTH ONCE DAILY.  ? sacubitril-valsartan (ENTRESTO) 24-26 MG Take 1 tablet by mouth 2 (two) times daily.  ? traZODone (DESYREL) 50 MG tablet Take 1 tablet (50 mg total) by mouth at bedtime as needed. for sleep  ? TRELEGY ELLIPTA 100-62.5-25 MCG/ACT AEPB Inhale 1 puff into the lungs daily.  ?  ? ?Allergies:   Codeine, Other, and Statins  ? ?Social History  ? ?Socioeconomic History  ? Marital status: Married  ?  Spouse name: Not on file  ? Number of children: Not on file  ? Years of education: Not on file  ? Highest education level: Not on file  ?Occupational History  ? Not on file  ?Tobacco Use  ? Smoking status: Former  ?  Packs/day: 1.00  ?  Years: 50.00  ?  Pack years: 50.00  ?  Types: Cigarettes  ?  Quit date: 03/25/2013  ?  Years since quitting: 8.2  ? Smokeless tobacco: Never  ?Vaping Use  ? Vaping Use: Never used  ?Substance and Sexual Activity  ? Alcohol use: No  ?  Alcohol/week: 0.0 standard drinks  ? Drug use: No  ? Sexual activity: Yes  ?Other Topics Concern  ? Not on file  ?Social History Narrative  ? Arm '66-'68, [place], agent orange exposure.   ? ?Social Determinants of Health  ? ?Financial Resource Strain: Not on file  ?Food Insecurity: Not on file  ?Transportation Needs: Not on file  ?Physical Activity: Not on file  ?Stress: Not on file  ?Social Connections: Not on file  ?  ? ?Family History: ?The patient's family history includes Cancer - Cervical in his sister; Heart attack (age of onset: 31) in his brother; Heart disease in his father, mother, sister, and sister; Hyperlipidemia in his father; Hypertension in his father. ? ?ROS:   ?Please  see the history of present illness.    ?All other systems reviewed and are negative. ? ?EKGs/Labs/Other Studies Reviewed:   ? ?The following studies were reviewed today: ?2D Echo Feb 2023: ? 1. Left ventricular ejection fraction, by estimation, is 40 to 45%. Left  ?ventricular ejection fraction by 2D MOD biplane is 42.8 %. The left  ?ventricle has mild to  moderately decreased function. The left ventricle  ?has no regional wall motion  ?abnormalities. Left ventricular diastolic parameters are consistent with  ?Grade I diastolic dysfunction (impaired relaxation).  ? 2. Right ventricular systolic function is normal. The right ventricular  ?size is normal.  ? 3. Left atrial size was moderately dilated.  ? 4. The mitral valve is degenerative. Mild to moderate mitral valve  ?regurgitation. Moderate mitral annular calcification.  ? 5. Aortic valve mean gradient 57mmHg, peak gradient 64mmHg, AVA 0.8cm2,  ?DVI 0.19. suggesting low flow-low gradient severe aortic stenosis.  ?consider low dose dobutamine study for further eval.. The aortic valve is  ?calcified. Aortic valve regurgitation is  ? not visualized. Severe aortic valve stenosis.  ? 6. The inferior vena cava is normal in size with greater than 50%  ?respiratory variability, suggesting right atrial pressure of 3 mmHg.  ? ?Comparison(s): Previous AS pressure gradients were, 49mmHg max, 12mmHg  ?mean.  ? ?FINDINGS  ? Left Ventricle: Left ventricular ejection fraction, by estimation, is 40  ?to 45%. Left ventricular ejection fraction by 2D MOD biplane is 42.8 %.  ?The left ventricle has mild to moderately decreased function. The left  ?ventricle has no regional wall motion  ? abnormalities. The left ventricular internal cavity size was normal in  ?size. There is no left ventricular hypertrophy. Left ventricular diastolic  ?parameters are consistent with Grade I diastolic dysfunction (impaired  ?relaxation).  ? ?Right Ventricle: The right ventricular size is normal. No  increase in  ?right ventricular wall thickness. Right ventricular systolic function is  ?normal.  ? ?Left Atrium: Left atrial size was moderately dilated.  ? ?Right Atrium: Right atrial size was normal in

## 2021-06-30 NOTE — Patient Instructions (Signed)
Medication Instructions:  ?Your physician recommends that you continue on your current medications as directed. Please refer to the Current Medication list given to you today. ? ?*If you need a refill on your cardiac medications before your next appointment, please call your pharmacy* ? ? ?Lab Work: ?NONE ?If you have labs (blood work) drawn today and your tests are completely normal, you will receive your results only by: ?MyChart Message (if you have MyChart) OR ?A paper copy in the mail ?If you have any lab test that is abnormal or we need to change your treatment, we will call you to review the results. ? ? ?Testing/Procedures: ?NONE ? ? ?Follow-Up: ?At [facility], you and your health needs are our priority.  As part of our continuing mission to provide you with exceptional heart care, we have created designated Provider Care Teams.  These Care Teams include your primary Cardiologist (physician) and Advanced Practice Providers (APPs -  Physician Assistants and Nurse Practitioners) who all work together to provide you with the care you need, when you need it. ? ?Your next appointment:   ?After ECHO scheduled in August ? ?Provider:   ?Sherren Mocha, MD ? ?  ?

## 2021-07-01 ENCOUNTER — Telehealth: Payer: Self-pay

## 2021-07-01 ENCOUNTER — Ambulatory Visit: Payer: Medicare Other

## 2021-07-01 NOTE — Telephone Encounter (Signed)
No longer a patient of [facility] ?

## 2021-07-01 NOTE — Telephone Encounter (Signed)
Noted, thanks!

## 2021-08-11 ENCOUNTER — Telehealth: Payer: Self-pay | Admitting: Cardiovascular Disease

## 2021-08-11 NOTE — Telephone Encounter (Signed)
Curtis Campos called in and stated pt is now ready to setup the Craterization and the scan and the procedure.  She would like to know if she suppose to reach out to [place] or [place] to get of this scheduled?   ? ?Best number [phone] ? ? ?

## 2021-08-12 NOTE — Telephone Encounter (Signed)
Per Burt Knack, he is okay to go ahead with below plan. Pt scheduled for OV on 5/24 to update his H&P, EKG, Labs for cath. ? ?Because of the patient's advanced age and poor functional capacity, transcatheter aortic valve replacement might be the safest and best tolerated treatment for him.  We discussed this procedure at length.  He understands that he would need to undergo a gated cardiac CTA, CTA of the chest, abdomen, and pelvis, as well as a right and left heart catheterization as part of his work-up.  Once his studies are completed, he will be referred for formal cardiac surgical consultation as part of a multidisciplinary approach to his care.  I think the patient would likely be a good candidate for TAVR pending this evaluation.  However, he is somewhat reluctant to proceed.  He would like to talk things over further with his family.  He will contact me if he decides to be scheduled for cardiac catheterization and CTA studies. ? ?Pt asked that we call daughter-in-law Brodey Bonn @[phone] to coordinate date for cath. Called Raquel Sarna and cath is now scheduled for 08/27/21 @1 :30pm. Directions for cath will be sent via MyChart-she states she can log in and review with patient. Wife catherine made aware of procedure and plan. Theodosia Quay aware of plan to move forward. ?

## 2021-08-17 ENCOUNTER — Encounter: Payer: Self-pay | Admitting: Cardiovascular Disease

## 2021-08-18 ENCOUNTER — Encounter: Payer: Self-pay | Admitting: Cardiovascular Disease

## 2021-08-19 NOTE — Progress Notes (Addendum)
[facility] [place]                                     Cardiology Office Note:    Date:  08/20/2021   ID:  Curtis Campos, DOB 10-22-1940, MRN [id number]  PCP:  Rusty Aus, MD  [facility] Cardiologist:  Kathlyn [place], MD  [facility] Electrophysiologist:  None   Referring MD: [name], NP   Set up [facility] 08/27/21  History of Present Illness:    Curtis Campos is a 81 y.o. male with a hx of COPD on home 02, former tobacco abuse, CAD, ischemic CM, HFrEF, arthritis with poor functional capacity, mitral regurgitation and severe AS who presents to clinic for ongoing discussion about TAVR work up.  In reviewing his past echo studies, he has had moderate aortic stenosis dating back to 2017. The patient's LVEF has been as low as 30 to 35%, then improved to 55% in 2022. LVEF has declined again, now in the 40 to 45% range.  His recent echocardiogram in February showed a mean transaortic gradient of 22 mmHg, peak gradient 36 mmHg, dimensionless index 0.24, and calculated aortic valve area of 0.81 cm as well as mild to moderate MR. He was seen by Dr. Burt Knack on 06/30/21 for TAVR evaluation. He was initially reluctant to proceed, but later called back and wanted to pursue TAVR work up and set up for [facility] 08/28/19.    Today the patient presents to clinic for follow up. Here with wife and daughter in law. No CP or SOB. No LE edema. Occasionally has orthopnea but no PND. No dizziness or syncope. No blood in stool or urine. No palpitations.    Past Medical History:  Diagnosis Date   Agent orange exposure    Arthritis 03/12/2011   CHF (congestive heart failure) ([facility])    Chronic obstructive lung disease ([place])    Coronary artery disease 03/2013   a. 03/2013 Cath: LM nl, LAD 47m, D1 50, D2 30, LCX 54m/d (chronic), OM1 50, RCA 40p, 21m, RPL 40, RPDA 60, EF 25%, mod-sev MR-->Med mgmt; b. 08/2019 MV: basal inflat/mid inflat defect consistent  w/ ischemia in LCX distribution. EF 37%. Intermediate risk.   HFrEF (heart failure with reduced ejection fraction) ([place])    a. 03/2013 TEE: EF 25%; b. 08/2017 Echo: EF 45-50%, Gr1 DD. Mod AS. Mod MR. Mildly dil LA; c. 06/2019 Echo: EF 30-35%, glob HK, gr1 DD; d. 06/2020 Echo: EF 55-60%, nl RV fxn, triv MR, Mod AS.   Hyperlipidemia    Hypertension    Ischemic cardiomyopathy    a. 03/2013 TEE: EF 25%; b. 08/2017 Echo: EF 45-50%; c. 06/2019 Echo: EF 30-35%; d. 06/2020 Echo: EF 55-60%.   Moderate aortic stenosis    a. 08/2017 Echo: Mod AS. Mean grad (S) 27mmHg. Valve area (VTI): 1.25cm^2; b. 06/2019 Echo: Mod AS. Mean grad 73mmHg, Valve area (VTI): 1.16cm^2); c. 06/2020 Mod AS. AVA 1.25cm^2, mean grad 73mmHg.   Moderate mitral regurgitation    a. 08/2017 Echo: mod MR; b. 06/2019 Echo: mild to mod MR.   Rectal cancer [facility])     Past Surgical History:  Procedure Laterality Date   APPENDECTOMY  06/2003   CARDIAC CATHETERIZATION  03/2013   [facility]   cataract surgery     COLOSTOMY     HERNIA REPAIR  resection of rectum      Current Medications: Current Meds  Medication Sig   albuterol (VENTOLIN HFA) 108 (90 Base) MCG/ACT inhaler INHALE 2 PUFFS BY MOUTH EVERY 6 HOURS AS NEEDED FOR SHORTNESS OF BREATH   Alirocumab (PRALUENT) 75 MG/ML SOAJ Inject 1 pen into the skin every 14 (fourteen) days.   aspirin 81 MG tablet Take 81 mg by mouth daily. Takes at night   carvedilol (COREG) 6.25 MG tablet Take 1 tablet (6.25 mg total) by mouth 2 (two) times daily.   cholecalciferol (VITAMIN D3) 25 MCG (1000 UNIT) tablet Take 1,000 Units by mouth daily.   dapagliflozin propanediol (FARXIGA) 10 MG TABS tablet Take 1 tablet (10 mg total) by mouth daily before breakfast.   diclofenac Sodium (VOLTAREN) 1 % GEL APPLY 4 GRAMS TO AFFECTED AREA FOUR TIMES A DAY AS NEEDED FOR HIP PAIN-TOTAL DAILY DOSE SHOULD NOT EXCEED 32G PER DAY OVER ALL AFFECTEDJOINTS.FOR PAIN.   ezetimibe (ZETIA) 10 MG tablet Take 1 tablet by mouth once daily  for cholesterol. NEED APPOINTMENT FOR ANY MORE REFILLS   furosemide (LASIX) 20 MG tablet TAKE 1 TABLET BY MOUTH ONCE DAILY.   guaiFENesin (ROBITUSSIN) 100 MG/5ML liquid Take 10 mLs by mouth every 4 (four) hours as needed for cough or to loosen phlegm.   Magnesium Oxide 400 (240 Mg) MG TABS TAKE 1 TABLET BY MOUTH ONCE DAILY.   sacubitril-valsartan (ENTRESTO) 24-26 MG Take 1 tablet by mouth 2 (two) times daily.   traZODone (DESYREL) 50 MG tablet Take 1 tablet (50 mg total) by mouth at bedtime as needed. for sleep   TRELEGY ELLIPTA 100-62.5-25 MCG/ACT AEPB Inhale 1 puff into the lungs daily.     Allergies:   Codeine, Other, and Statins   Social History   Socioeconomic History   Marital status: Married    Spouse name: Not on file   Number of children: Not on file   Years of education: Not on file   Highest education level: Not on file  Occupational History   Not on file  Tobacco Use   Smoking status: Former    Packs/day: 1.00    Years: 50.00    Pack years: 50.00    Types: Cigarettes    Quit date: 03/25/2013    Years since quitting: 8.4   Smokeless tobacco: Never  Vaping Use   Vaping Use: Never used  Substance and Sexual Activity   Alcohol use: No    Alcohol/week: 0.0 standard drinks   Drug use: No   Sexual activity: Yes  Other Topics Concern   Not on file  Social History Narrative   Arm '66-'68, [place], agent orange exposure.    Social Determinants of Health   Financial Resource Strain: Not on file  Food Insecurity: Not on file  Transportation Needs: Not on file  Physical Activity: Not on file  Stress: Not on file  Social Connections: Not on file     Family History: The patient's family history includes Cancer - Cervical in his sister; Heart attack (age of onset: 34) in his brother; Heart disease in his father, mother, sister, and sister; Hyperlipidemia in his father; Hypertension in his father.  ROS:   Please see the history of present illness.    All other  systems reviewed and are negative.  EKGs/Labs/Other Studies Reviewed:    The following studies were reviewed today:  2D Echo Feb 2023:  1. Left ventricular ejection fraction, by estimation, is 40 to 45%. Left  ventricular ejection fraction  by 2D MOD biplane is 42.8 %. The left  ventricle has mild to moderately decreased function. The left ventricle  has no regional wall motion  abnormalities. Left ventricular diastolic parameters are consistent with  Grade I diastolic dysfunction (impaired relaxation).   2. Right ventricular systolic function is normal. The right ventricular  size is normal.   3. Left atrial size was moderately dilated.   4. The mitral valve is degenerative. Mild to moderate mitral valve  regurgitation. Moderate mitral annular calcification.   5. Aortic valve mean gradient 26mmHg, peak gradient 24mmHg, AVA 0.8cm2,  DVI 0.19. suggesting low flow-low gradient severe aortic stenosis.  consider low dose dobutamine study for further eval.. The aortic valve is  calcified. Aortic valve regurgitation is   not visualized. Severe aortic valve stenosis.   6. The inferior vena cava is normal in size with greater than 50%  respiratory variability, suggesting right atrial pressure of 3 mmHg.   Comparison(s): Previous AS pressure gradients were, 33mmHg max, 53mmHg  mean.    EKG:  EKG is  ordered today.  The ekg ordered today demonstrates sinus HR 67  Recent Labs: 03/11/2021: B Natriuretic Peptide 282.5 03/12/2021: Magnesium 2.4 03/26/2021: Hemoglobin 11.7; Platelets 330.0 05/30/2021: BUN 8; Creatinine, Ser 0.85; Potassium 4.1; Sodium 135  Recent Lipid Panel    Component Value Date/Time   CHOL 185 02/12/2020 1454   TRIG 128.0 02/12/2020 1454   HDL 53.50 02/12/2020 1454   CHOLHDL 3 02/12/2020 1454   VLDL 25.6 02/12/2020 1454   LDLCALC 106 (H) 02/12/2020 1454   LDLDIRECT 129.7 04/14/2012 0756     Risk Assessment/Calculations:     STS score Procedure: Isolated  AVR Risk of Mortality: 2.812% Renal Failure: 1.934% Permanent Stroke: 1.558% Prolonged Ventilation: 9.157% DSW Infection: 0.093% Reoperation: 3.231% Morbidity or Mortality: 13.319% Short Length of Stay: 22.890% Long Length of Stay: 7.379%    Physical Exam:    VS:  BP 122/64   Pulse 67   Ht 5\' 6"  (1.676 m)   Wt 176 lb 3.2 oz (79.9 kg)   SpO2 90%   BMI 28.44 kg/m     Wt Readings from Last 3 Encounters:  08/20/21 176 lb 3.2 oz (79.9 kg)  06/30/21 171 lb 12.8 oz (77.9 kg)  06/17/21 173 lb (78.5 kg)     GEN:  Well nourished, well developed in no acute distress HEENT: Normal NECK: No JVD LYMPHATICS: No lymphadenopathy CARDIAC: RRR, 3/6 systolic murmur. no rubs, gallops RESPIRATORY:  Clear to auscultation without rales, wheezing or rhonchi  ABDOMEN: Soft, non-tender, non-distended MUSCULOSKELETAL:  No edema; No deformity  SKIN: Warm and dry NEUROLOGIC:  Alert and oriented x 3 PSYCHIATRIC:  Normal affect   ASSESSMENT:    1. Nonrheumatic aortic valve stenosis   2. Essential hypertension   3. Chronic obstructive pulmonary disease, unspecified COPD type ([place])   4. Ischemic cardiomyopathy   5. HFrEF (heart failure with reduced ejection fraction) ([place])   6. Coronary artery disease involving native coronary artery of native heart without angina pectoris    PLAN:    In order of problems listed above:  Severe AS: wants to proceed with TAVR work up now. Will start with L/RHC next week with Dr Burt Knack. BMET and CBC. Consent below.   HTN: BP well controlled. No changes made.   COPD: stable   ICM/HFrEF: continue GDMT with Lindi Adie, Coreg as well as Lasix.   CAD: no chest pain. Continue Aspirin, Praulant and Zetia  Pulmonary nodule: being followed by  pulm with a CT in July. Will reach out to pulmonologst and let them know we will do a CT chest as a part of our pre TAVR scans and cancel their scan.    Shared Decision Making/Informed Consent The risks  [stroke (1 in 1000), death (1 in 1000), kidney failure [usually temporary] (1 in 500), bleeding (1 in 200), allergic reaction [possibly serious] (1 in 200)], benefits (diagnostic support and management of coronary artery disease) and alternatives of a cardiac catheterization were discussed in detail with Mr. Hosang and he is willing to proceed.   Total time spent with patient was over 40 minutes which included evaluating patient, reviewing record and coordinating care. Face to face time >50%.   Medication Adjustments/Labs and Tests Ordered: Current medicines are reviewed at length with the patient today.  Concerns regarding medicines are outlined above.  Orders Placed This Encounter  Procedures   CBC   Basic metabolic panel   EKG 42-ASTM   No orders of the defined types were placed in this encounter.   Patient Instructions  Medication Instructions:  Your physician recommends that you continue on your current medications as directed. Please refer to the Current Medication list given to you today.  *If you need a refill on your cardiac medications before your next appointment, please call your pharmacy*   Lab Work: Bmp, Cbc- today   If you have labs (blood work) drawn today and your tests are completely normal, you will receive your results only by: [place] (if you have MyChart) OR A paper copy in the mail If you have any lab test that is abnormal or we need to change your treatment, we will call you to review the results.   Testing/Procedures: None ordered today    Follow-Up: Follow up as scheduled     Other Instructions   Important Information About Sugar         Signed, Angelena Form, PA-C  08/20/2021 8:19 PM    [facility]

## 2021-08-19 NOTE — H&P (View-Only) (Signed)
[facility] [place]                                     Cardiology Office Note:    Date:  08/20/2021   ID:  Curtis Campos, DOB 1940-09-12, MRN [id number]  PCP:  Curtis Aus, MD  [facility] Cardiologist:  Curtis Sacramento, MD  [facility] Electrophysiologist:  None   Referring MD: Curtis Koch, NP   Set up [facility] 08/27/21  History of Present Illness:    Curtis Campos is a 81 y.o. male with a hx of COPD on home 02, former tobacco abuse, CAD, ischemic CM, HFrEF, arthritis with poor functional capacity, mitral regurgitation and severe AS who presents to clinic for ongoing discussion about TAVR work up.  In reviewing his past echo studies, he has had moderate aortic stenosis dating back to 2017. The patient's LVEF has been as low as 30 to 35%, then improved to 55% in 2022. LVEF has declined again, now in the 40 to 45% range.  His recent echocardiogram in February showed a mean transaortic gradient of 22 mmHg, peak gradient 36 mmHg, dimensionless index 0.24, and calculated aortic valve area of 0.81 cm as well as mild to moderate MR. He was seen by Dr. Burt Knack on 06/30/21 for TAVR evaluation. He was initially reluctant to proceed, but later called back and wanted to pursue TAVR work up and set up for [facility] 08/28/19.    Today the patient presents to clinic for follow up. Here with wife and daughter in law. No CP or SOB. No LE edema. Occasionally has orthopnea but no PND. No dizziness or syncope. No blood in stool or urine. No palpitations.    Past Medical History:  Diagnosis Date   Agent orange exposure    Arthritis 03/12/2011   CHF (congestive heart failure) (HCC)    Chronic obstructive lung disease ([place])    Coronary artery disease 03/2013   a. 03/2013 Cath: LM nl, LAD 61m, D1 50, D2 30, LCX 19m/d (chronic), OM1 50, RCA 40p, 100m, RPL 40, RPDA 60, EF 25%, mod-sev MR-->Med mgmt; b. 08/2019 MV: basal inflat/mid inflat defect consistent  w/ ischemia in LCX distribution. EF 37%. Intermediate risk.   HFrEF (heart failure with reduced ejection fraction) ([place])    a. 03/2013 TEE: EF 25%; b. 08/2017 Echo: EF 45-50%, Gr1 DD. Mod AS. Mod MR. Mildly dil LA; c. 06/2019 Echo: EF 30-35%, glob HK, gr1 DD; d. 06/2020 Echo: EF 55-60%, nl RV fxn, triv MR, Mod AS.   Hyperlipidemia    Hypertension    Ischemic cardiomyopathy    a. 03/2013 TEE: EF 25%; b. 08/2017 Echo: EF 45-50%; c. 06/2019 Echo: EF 30-35%; d. 06/2020 Echo: EF 55-60%.   Moderate aortic stenosis    a. 08/2017 Echo: Mod AS. Mean grad (S) 70mmHg. Valve area (VTI): 1.25cm^2; b. 06/2019 Echo: Mod AS. Mean grad 12mmHg, Valve area (VTI): 1.16cm^2); c. 06/2020 Mod AS. AVA 1.25cm^2, mean grad 45mmHg.   Moderate mitral regurgitation    a. 08/2017 Echo: mod MR; b. 06/2019 Echo: mild to mod MR.   Rectal cancer [facility])     Past Surgical History:  Procedure Laterality Date   APPENDECTOMY  06/2003   CARDIAC CATHETERIZATION  03/2013   [facility]   cataract surgery     COLOSTOMY     HERNIA REPAIR  resection of rectum      Current Medications: Current Meds  Medication Sig   albuterol (VENTOLIN HFA) 108 (90 Base) MCG/ACT inhaler INHALE 2 PUFFS BY MOUTH EVERY 6 HOURS AS NEEDED FOR SHORTNESS OF BREATH   Alirocumab (PRALUENT) 75 MG/ML SOAJ Inject 1 pen into the skin every 14 (fourteen) days.   aspirin 81 MG tablet Take 81 mg by mouth daily. Takes at night   carvedilol (COREG) 6.25 MG tablet Take 1 tablet (6.25 mg total) by mouth 2 (two) times daily.   cholecalciferol (VITAMIN D3) 25 MCG (1000 UNIT) tablet Take 1,000 Units by mouth daily.   dapagliflozin propanediol (FARXIGA) 10 MG TABS tablet Take 1 tablet (10 mg total) by mouth daily before breakfast.   diclofenac Sodium (VOLTAREN) 1 % GEL APPLY 4 GRAMS TO AFFECTED AREA FOUR TIMES A DAY AS NEEDED FOR HIP PAIN-TOTAL DAILY DOSE SHOULD NOT EXCEED 32G PER DAY OVER ALL AFFECTEDJOINTS.FOR PAIN.   ezetimibe (ZETIA) 10 MG tablet Take 1 tablet by mouth once daily  for cholesterol. NEED APPOINTMENT FOR ANY MORE REFILLS   furosemide (LASIX) 20 MG tablet TAKE 1 TABLET BY MOUTH ONCE DAILY.   guaiFENesin (ROBITUSSIN) 100 MG/5ML liquid Take 10 mLs by mouth every 4 (four) hours as needed for cough or to loosen phlegm.   Magnesium Oxide 400 (240 Mg) MG TABS TAKE 1 TABLET BY MOUTH ONCE DAILY.   sacubitril-valsartan (ENTRESTO) 24-26 MG Take 1 tablet by mouth 2 (two) times daily.   traZODone (DESYREL) 50 MG tablet Take 1 tablet (50 mg total) by mouth at bedtime as needed. for sleep   TRELEGY ELLIPTA 100-62.5-25 MCG/ACT AEPB Inhale 1 puff into the lungs daily.     Allergies:   Codeine, Other, and Statins   Social History   Socioeconomic History   Marital status: Married    Spouse name: Not on file   Number of children: Not on file   Years of education: Not on file   Highest education level: Not on file  Occupational History   Not on file  Tobacco Use   Smoking status: Former    Packs/day: 1.00    Years: 50.00    Pack years: 50.00    Types: Cigarettes    Quit date: 03/25/2013    Years since quitting: 8.4   Smokeless tobacco: Never  Vaping Use   Vaping Use: Never used  Substance and Sexual Activity   Alcohol use: No    Alcohol/week: 0.0 standard drinks   Drug use: No   Sexual activity: Yes  Other Topics Concern   Not on file  Social History Narrative   Arm '66-'68, [place], agent orange exposure.    Social Determinants of Health   Financial Resource Strain: Not on file  Food Insecurity: Not on file  Transportation Needs: Not on file  Physical Activity: Not on file  Stress: Not on file  Social Connections: Not on file     Family History: The patient's family history includes Cancer - Cervical in his sister; Heart attack (age of onset: 65) in his brother; Heart disease in his father, mother, sister, and sister; Hyperlipidemia in his father; Hypertension in his father.  ROS:   Please see the history of present illness.    All other  systems reviewed and are negative.  EKGs/Labs/Other Studies Reviewed:    The following studies were reviewed today:  2D Echo Feb 2023:  1. Left ventricular ejection fraction, by estimation, is 40 to 45%. Left  ventricular ejection fraction  by 2D MOD biplane is 42.8 %. The left  ventricle has mild to moderately decreased function. The left ventricle  has no regional wall motion  abnormalities. Left ventricular diastolic parameters are consistent with  Grade I diastolic dysfunction (impaired relaxation).   2. Right ventricular systolic function is normal. The right ventricular  size is normal.   3. Left atrial size was moderately dilated.   4. The mitral valve is degenerative. Mild to moderate mitral valve  regurgitation. Moderate mitral annular calcification.   5. Aortic valve mean gradient 74mmHg, peak gradient 78mmHg, AVA 0.8cm2,  DVI 0.19. suggesting low flow-low gradient severe aortic stenosis.  consider low dose dobutamine study for further eval.. The aortic valve is  calcified. Aortic valve regurgitation is   not visualized. Severe aortic valve stenosis.   6. The inferior vena cava is normal in size with greater than 50%  respiratory variability, suggesting right atrial pressure of 3 mmHg.   Comparison(s): Previous AS pressure gradients were, 80mmHg max, 56mmHg  mean.    EKG:  EKG is  ordered today.  The ekg ordered today demonstrates sinus HR 67  Recent Labs: 03/11/2021: B Natriuretic Peptide 282.5 03/12/2021: Magnesium 2.4 03/26/2021: Hemoglobin 11.7; Platelets 330.0 05/30/2021: BUN 8; Creatinine, Ser 0.85; Potassium 4.1; Sodium 135  Recent Lipid Panel    Component Value Date/Time   CHOL 185 02/12/2020 1454   TRIG 128.0 02/12/2020 1454   HDL 53.50 02/12/2020 1454   CHOLHDL 3 02/12/2020 1454   VLDL 25.6 02/12/2020 1454   LDLCALC 106 (H) 02/12/2020 1454   LDLDIRECT 129.7 04/14/2012 0756     Risk Assessment/Calculations:       Physical Exam:    VS:  BP 122/64    Pulse 67   Ht 5\' 6"  (1.676 m)   Wt 176 lb 3.2 oz (79.9 kg)   SpO2 90%   BMI 28.44 kg/m     Wt Readings from Last 3 Encounters:  08/20/21 176 lb 3.2 oz (79.9 kg)  06/30/21 171 lb 12.8 oz (77.9 kg)  06/17/21 173 lb (78.5 kg)     GEN:  Well nourished, well developed in no acute distress HEENT: Normal NECK: No JVD LYMPHATICS: No lymphadenopathy CARDIAC: RRR, 3/6 systolic murmur. no rubs, gallops RESPIRATORY:  Clear to auscultation without rales, wheezing or rhonchi  ABDOMEN: Soft, non-tender, non-distended MUSCULOSKELETAL:  No edema; No deformity  SKIN: Warm and dry NEUROLOGIC:  Alert and oriented x 3 PSYCHIATRIC:  Normal affect   ASSESSMENT:    1. Nonrheumatic aortic valve stenosis   2. Essential hypertension   3. Chronic obstructive pulmonary disease, unspecified COPD type ([place])   4. Ischemic cardiomyopathy   5. HFrEF (heart failure with reduced ejection fraction) ([place])   6. Coronary artery disease involving native coronary artery of native heart without angina pectoris    PLAN:    In order of problems listed above:  Severe AS: wants to proceed with TAVR work up now. Will start with L/RHC next week with Dr Burt Knack. BMET and CBC. Consent below.   HTN: BP well controlled. No changes made.   COPD: stable   ICM/HFrEF: continue GDMT with Lindi Adie, Coreg as well as Lasix.   CAD: no chest pain. Continue Aspirin, Praulant and Zetia  Pulmonary nodule: being followed by pulm with a CT in July. Will reach out to pulmonologst and let them know we will do a CT chest as a part of our pre TAVR scans and cancel their scan.    Shared Decision  Making/Informed Consent The risks [stroke (1 in 1000), death (1 in 41), kidney failure [usually temporary] (1 in 500), bleeding (1 in 200), allergic reaction [possibly serious] (1 in 200)], benefits (diagnostic support and management of coronary artery disease) and alternatives of a cardiac catheterization were discussed in detail  with Mr. Gamero and he is willing to proceed.   Total time spent with patient was over 40 minutes which included evaluating patient, reviewing record and coordinating care. Face to face time >50%.   Medication Adjustments/Labs and Tests Ordered: Current medicines are reviewed at length with the patient today.  Concerns regarding medicines are outlined above.  Orders Placed This Encounter  Procedures   CBC   Basic metabolic panel   EKG [id number]   No orders of the defined types were placed in this encounter.   Patient Instructions  Medication Instructions:  Your physician recommends that you continue on your current medications as directed. Please refer to the Current Medication list given to you today.  *If you need a refill on your cardiac medications before your next appointment, please call your pharmacy*   Lab Work: Bmp, Cbc- today   If you have labs (blood work) drawn today and your tests are completely normal, you will receive your results only by: [place] (if you have MyChart) OR A paper copy in the mail If you have any lab test that is abnormal or we need to change your treatment, we will call you to review the results.   Testing/Procedures: None ordered today    Follow-Up: Follow up as scheduled     Other Instructions   Important Information About Sugar         Signed, Angelena Form, PA-C  08/20/2021 8:19 PM    [facility]

## 2021-08-20 ENCOUNTER — Ambulatory Visit (INDEPENDENT_AMBULATORY_CARE_PROVIDER_SITE_OTHER): Payer: Medicare Other | Admitting: Physician Assistant

## 2021-08-20 VITALS — BP 122/64 | HR 67 | Ht 66.0 in | Wt 176.2 lb

## 2021-08-20 DIAGNOSIS — J449 Chronic obstructive pulmonary disease, unspecified: Secondary | ICD-10-CM

## 2021-08-20 DIAGNOSIS — I255 Ischemic cardiomyopathy: Secondary | ICD-10-CM | POA: Diagnosis not present

## 2021-08-20 DIAGNOSIS — I350 Nonrheumatic aortic (valve) stenosis: Secondary | ICD-10-CM

## 2021-08-20 DIAGNOSIS — I2510 Atherosclerotic heart disease of native coronary artery without angina pectoris: Secondary | ICD-10-CM

## 2021-08-20 DIAGNOSIS — I10 Essential (primary) hypertension: Secondary | ICD-10-CM

## 2021-08-20 DIAGNOSIS — I5020 Unspecified systolic (congestive) heart failure: Secondary | ICD-10-CM

## 2021-08-20 NOTE — Patient Instructions (Addendum)
Medication Instructions:  Your physician recommends that you continue on your current medications as directed. Please refer to the Current Medication list given to you today.  *If you need a refill on your cardiac medications before your next appointment, please call your pharmacy*   Lab Work: Bmp, Cbc- today   If you have labs (blood work) drawn today and your tests are completely normal, you will receive your results only by: [place] (if you have MyChart) OR A paper copy in the mail If you have any lab test that is abnormal or we need to change your treatment, we will call you to review the results.   Testing/Procedures: None ordered today    Follow-Up: Follow up as scheduled     Other Instructions   Important Information About Sugar

## 2021-08-21 LAB — CBC
Hematocrit: 39.5 % (ref 37.5–51.0)
Hemoglobin: 13.3 g/dL (ref 13.0–17.7)
MCH: 29.9 pg (ref 26.6–33.0)
MCHC: 33.7 g/dL (ref 31.5–35.7)
MCV: 89 fL (ref 79–97)
Platelets: 293 10*3/uL (ref 150–450)
RBC: 4.45 x10E6/uL (ref 4.14–5.80)
RDW: 13.3 % (ref 11.6–15.4)
WBC: 9.6 10*3/uL (ref 3.4–10.8)

## 2021-08-21 LAB — BASIC METABOLIC PANEL
BUN/Creatinine Ratio: 20 (ref 10–24)
BUN: 16 mg/dL (ref 8–27)
CO2: 25 mmol/L (ref 20–29)
Calcium: 9.2 mg/dL (ref 8.6–10.2)
Chloride: 100 mmol/L (ref 96–106)
Creatinine, Ser: 0.81 mg/dL (ref 0.76–1.27)
Glucose: 89 mg/dL (ref 70–99)
Potassium: 4.5 mmol/L (ref 3.5–5.2)
Sodium: 140 mmol/L (ref 134–144)
eGFR: 89 mL/min/{1.73_m2} (ref >59)

## 2021-08-27 ENCOUNTER — Ambulatory Visit (HOSPITAL_COMMUNITY)
Admission: RE | Admit: 2021-08-27 | Discharge: 2021-08-27 | Disposition: A | Discharge status: Home / Self Care | Payer: Medicare Other | Source: Ambulatory Visit | Attending: Cardiovascular Disease | Admitting: Cardiovascular Disease

## 2021-08-27 ENCOUNTER — Encounter (HOSPITAL_COMMUNITY)
Admission: RE | Disposition: A | Discharge status: Home / Self Care | Payer: Self-pay | Source: Ambulatory Visit | Attending: Cardiovascular Disease

## 2021-08-27 DIAGNOSIS — Z7982 Long term (current) use of aspirin: Secondary | ICD-10-CM | POA: Insufficient documentation

## 2021-08-27 DIAGNOSIS — I5022 Chronic systolic (congestive) heart failure: Secondary | ICD-10-CM | POA: Insufficient documentation

## 2021-08-27 DIAGNOSIS — I255 Ischemic cardiomyopathy: Secondary | ICD-10-CM | POA: Insufficient documentation

## 2021-08-27 DIAGNOSIS — Z79899 Other long term (current) drug therapy: Secondary | ICD-10-CM | POA: Insufficient documentation

## 2021-08-27 DIAGNOSIS — I340 Nonrheumatic mitral (valve) insufficiency: Secondary | ICD-10-CM | POA: Insufficient documentation

## 2021-08-27 DIAGNOSIS — Z87891 Personal history of nicotine dependence: Secondary | ICD-10-CM | POA: Insufficient documentation

## 2021-08-27 DIAGNOSIS — Z9981 Dependence on supplemental oxygen: Secondary | ICD-10-CM | POA: Insufficient documentation

## 2021-08-27 DIAGNOSIS — J449 Chronic obstructive pulmonary disease, unspecified: Secondary | ICD-10-CM | POA: Insufficient documentation

## 2021-08-27 DIAGNOSIS — I110 Hypertensive heart disease with heart failure: Secondary | ICD-10-CM | POA: Insufficient documentation

## 2021-08-27 DIAGNOSIS — I350 Nonrheumatic aortic (valve) stenosis: Secondary | ICD-10-CM | POA: Insufficient documentation

## 2021-08-27 DIAGNOSIS — I2510 Atherosclerotic heart disease of native coronary artery without angina pectoris: Secondary | ICD-10-CM | POA: Diagnosis not present

## 2021-08-27 HISTORY — PX: RIGHT/LEFT HEART CATH AND CORONARY ANGIOGRAPHY: CATH118266

## 2021-08-27 LAB — POCT I-STAT 7, (LYTES, BLD GAS, ICA,H+H)
Acid-Base Excess: 4 mmol/L — ABNORMAL HIGH (ref 0.0–2.0)
Bicarbonate: 30.4 mmol/L — ABNORMAL HIGH (ref 20.0–28.0)
Calcium, Ion: 1.22 mmol/L (ref 1.15–1.40)
HCT: 42 % (ref 39.0–52.0)
Hemoglobin: 14.3 g/dL (ref 13.0–17.0)
O2 Saturation: 96 %
Potassium: 4.1 mmol/L (ref 3.5–5.1)
Sodium: 138 mmol/L (ref 135–145)
TCO2: 32 mmol/L (ref 22–32)
pCO2 arterial: 52.6 mmHg — ABNORMAL HIGH (ref 32–48)
pH, Arterial: 7.37 (ref 7.35–7.45)
pO2, Arterial: 90 mmHg (ref 83–108)

## 2021-08-27 LAB — POCT I-STAT EG7
Acid-Base Excess: 5 mmol/L — ABNORMAL HIGH (ref 0.0–2.0)
Bicarbonate: 32.1 mmol/L — ABNORMAL HIGH (ref 20.0–28.0)
Calcium, Ion: 1.24 mmol/L (ref 1.15–1.40)
HCT: 41 % (ref 39.0–52.0)
Hemoglobin: 13.9 g/dL (ref 13.0–17.0)
O2 Saturation: 73 %
Potassium: 4.1 mmol/L (ref 3.5–5.1)
Sodium: 138 mmol/L (ref 135–145)
TCO2: 34 mmol/L — ABNORMAL HIGH (ref 22–32)
pCO2, Ven: 58.5 mmHg (ref 44–60)
pH, Ven: 7.347 (ref 7.25–7.43)
pO2, Ven: 42 mmHg (ref 32–45)

## 2021-08-27 SURGERY — RIGHT/LEFT HEART CATH AND CORONARY ANGIOGRAPHY
Anesthesia: LOCAL

## 2021-08-27 MED ORDER — ACETAMINOPHEN 325 MG PO TABS: 650.0000 mg | ORAL_TABLET | ORAL | Status: DC | PRN

## 2021-08-27 MED ORDER — HEPARIN SODIUM (PORCINE) 1000 UNIT/ML IJ SOLN
INTRAMUSCULAR | Status: AC
  Filled 2021-08-27: qty 10

## 2021-08-27 MED ORDER — HEPARIN (PORCINE) IN NACL 1000-0.9 UT/500ML-% IV SOLN
INTRAVENOUS | Status: AC
  Filled 2021-08-27: qty 1000

## 2021-08-27 MED ORDER — HEPARIN SODIUM (PORCINE) 1000 UNIT/ML IJ SOLN
INTRAMUSCULAR | Status: DC | PRN
  Administered 2021-08-27: 4000 [IU] via INTRAVENOUS

## 2021-08-27 MED ORDER — SODIUM CHLORIDE 0.9 % IV SOLN: 250.0000 mL | INTRAVENOUS | Status: DC | PRN

## 2021-08-27 MED ORDER — ONDANSETRON HCL 4 MG/2ML IJ SOLN: 4.0000 mg | Freq: Four times a day (QID) | INTRAMUSCULAR | Status: DC | PRN

## 2021-08-27 MED ORDER — LIDOCAINE HCL (PF) 1 % IJ SOLN
INTRAMUSCULAR | Status: DC | PRN
Start: 2021-08-27 — End: 2021-08-27
  Administered 2021-08-27 (×2): 2 mL

## 2021-08-27 MED ORDER — SODIUM CHLORIDE 0.9 % IV SOLN: INTRAVENOUS | Status: DC

## 2021-08-27 MED ORDER — MIDAZOLAM HCL 2 MG/2ML IJ SOLN
INTRAMUSCULAR | Status: AC
  Filled 2021-08-27: qty 2

## 2021-08-27 MED ORDER — VERAPAMIL HCL 2.5 MG/ML IV SOLN
INTRAVENOUS | Status: DC | PRN
  Administered 2021-08-27: 10 mL via INTRA_ARTERIAL

## 2021-08-27 MED ORDER — VERAPAMIL HCL 2.5 MG/ML IV SOLN
INTRAVENOUS | Status: AC
  Filled 2021-08-27: qty 2

## 2021-08-27 MED ORDER — ASPIRIN 81 MG PO CHEW
81.0000 mg | CHEWABLE_TABLET | ORAL | Status: AC
  Administered 2021-08-27: 81 mg via ORAL
  Filled 2021-08-27: qty 1

## 2021-08-27 MED ORDER — SODIUM CHLORIDE 0.9% FLUSH: 3.0000 mL | INTRAVENOUS | Status: DC | PRN

## 2021-08-27 MED ORDER — SODIUM CHLORIDE 0.9% FLUSH: 3.0000 mL | Freq: Two times a day (BID) | INTRAVENOUS | Status: DC

## 2021-08-27 MED ORDER — MIDAZOLAM HCL 2 MG/2ML IJ SOLN
INTRAMUSCULAR | Status: DC | PRN
  Administered 2021-08-27: 1 mg via INTRAVENOUS

## 2021-08-27 MED ORDER — FENTANYL CITRATE (PF) 100 MCG/2ML IJ SOLN
INTRAMUSCULAR | Status: AC
Start: 2021-08-27 — End: ?
  Filled 2021-08-27: qty 2

## 2021-08-27 MED ORDER — HEPARIN (PORCINE) IN NACL 1000-0.9 UT/500ML-% IV SOLN
INTRAVENOUS | Status: DC | PRN
  Administered 2021-08-27 (×2): 500 mL

## 2021-08-27 MED ORDER — SODIUM CHLORIDE 0.9 % WEIGHT BASED INFUSION: 1.0000 mL/kg/h | INTRAVENOUS | Status: DC

## 2021-08-27 MED ORDER — LABETALOL HCL 5 MG/ML IV SOLN: 10.0000 mg | INTRAVENOUS | Status: DC | PRN

## 2021-08-27 MED ORDER — IOHEXOL 350 MG/ML SOLN
INTRAVENOUS | Status: DC | PRN
  Administered 2021-08-27: 45 mL via INTRA_ARTERIAL

## 2021-08-27 MED ORDER — LIDOCAINE HCL (PF) 1 % IJ SOLN
INTRAMUSCULAR | Status: AC
  Filled 2021-08-27: qty 30

## 2021-08-27 MED ORDER — HYDRALAZINE HCL 20 MG/ML IJ SOLN: 10.0000 mg | INTRAMUSCULAR | Status: DC | PRN

## 2021-08-27 MED ORDER — FENTANYL CITRATE (PF) 100 MCG/2ML IJ SOLN
INTRAMUSCULAR | Status: DC | PRN
  Administered 2021-08-27: 25 ug via INTRAVENOUS

## 2021-08-27 SURGICAL SUPPLY — 17 items
BAND CMPR LRG ZPHR (HEMOSTASIS) ×1
BAND ZEPHYR COMPRESS 30 LONG (HEMOSTASIS) ×1 IMPLANT
CATH BALLN WEDGE 5F 110CM (CATHETERS) ×1 IMPLANT
CATH INFINITI 5FR AL1 (CATHETERS) ×1 IMPLANT
CATH INFINITI 5FR ANG PIGTAIL (CATHETERS) ×1 IMPLANT
CATH INFINITI JR4 5F (CATHETERS) ×1 IMPLANT
CATH JL3.5 FR DIAG (CATHETERS) ×1 IMPLANT
GLIDESHEATH SLEND SS 6F .021 (SHEATH) ×1 IMPLANT
GUIDEWIRE .025 260CM (WIRE) ×1 IMPLANT
GUIDEWIRE INQWIRE 1.5J.035X260 (WIRE) IMPLANT
INQWIRE 1.5J .035X260CM (WIRE) ×2
KIT HEART LEFT (KITS) ×2 IMPLANT
PACK CARDIAC CATHETERIZATION (CUSTOM PROCEDURE TRAY) ×2 IMPLANT
SHEATH GLIDE SLENDER 4/5FR (SHEATH) ×1 IMPLANT
TRANSDUCER W/STOPCOCK (MISCELLANEOUS) ×2 IMPLANT
TUBING CIL FLEX 10 FLL-RA (TUBING) ×2 IMPLANT
WIRE EMERALD ST .035X260CM (WIRE) ×1 IMPLANT

## 2021-08-27 NOTE — Interval H&P Note (Signed)
History and Physical Interval Note:  08/27/2021 2:41 PM  Curtis Campos  has presented today for surgery, with the diagnosis of aortic stenosis.  The various methods of treatment have been discussed with the patient and family. After consideration of risks, benefits and other options for treatment, the patient has consented to  Procedure(s): RIGHT/LEFT HEART CATH AND CORONARY ANGIOGRAPHY (N/A) as a surgical intervention.  The patient's history has been reviewed, patient examined, no change in status, stable for surgery.  I have reviewed the patient's chart and labs.  Questions were answered to the patient's satisfaction.     Sherren Mocha

## 2021-08-27 NOTE — Discharge Instructions (Signed)

## 2021-08-28 ENCOUNTER — Encounter (HOSPITAL_COMMUNITY): Payer: Self-pay | Admitting: Cardiovascular Disease

## 2021-09-03 ENCOUNTER — Telehealth: Payer: Self-pay | Admitting: Physician Assistant

## 2021-09-03 NOTE — Telephone Encounter (Signed)
Patient's daughter in law called and has some questions to ask about patient scan and TAVR that he is supposed to have by Dr. Burt Knack. Please call back

## 2021-09-04 ENCOUNTER — Other Ambulatory Visit: Payer: Self-pay

## 2021-09-04 DIAGNOSIS — I350 Nonrheumatic aortic (valve) stenosis: Secondary | ICD-10-CM

## 2021-09-05 ENCOUNTER — Other Ambulatory Visit: Payer: Self-pay

## 2021-09-05 DIAGNOSIS — I350 Nonrheumatic aortic (valve) stenosis: Secondary | ICD-10-CM

## 2021-09-05 NOTE — Telephone Encounter (Signed)
This is an old message.  I spoke with Raquel Sarna on 6/8 and 6/9 and all questions have been addressed.

## 2021-09-19 ENCOUNTER — Ambulatory Visit: Payer: Medicare Other | Admitting: Cardiovascular Disease

## 2021-09-23 ENCOUNTER — Encounter: Payer: Self-pay | Admitting: Cardiovascular Disease

## 2021-09-29 ENCOUNTER — Other Ambulatory Visit: Payer: Non-veteran care | Admitting: *Deleted

## 2021-09-29 DIAGNOSIS — I350 Nonrheumatic aortic (valve) stenosis: Secondary | ICD-10-CM

## 2021-09-30 LAB — BASIC METABOLIC PANEL
BUN/Creatinine Ratio: 15 (ref 10–24)
BUN: 15 mg/dL (ref 8–27)
CO2: 24 mmol/L (ref 20–29)
Calcium: 8.7 mg/dL (ref 8.6–10.2)
Chloride: 101 mmol/L (ref 96–106)
Creatinine, Ser: 0.99 mg/dL (ref 0.76–1.27)
Glucose: 119 mg/dL — ABNORMAL HIGH (ref 70–99)
Potassium: 4.2 mmol/L (ref 3.5–5.2)
Sodium: 140 mmol/L (ref 134–144)
eGFR: 77 mL/min/{1.73_m2} (ref >59)

## 2021-10-10 ENCOUNTER — Telehealth: Payer: Self-pay | Admitting: Cardiovascular Disease

## 2021-10-10 MED ORDER — DAPAGLIFLOZIN PROPANEDIOL 10 MG PO TABS: 10.0000 mg | ORAL_TABLET | Freq: Every day | ORAL | 1 refills | Status: DC

## 2021-10-10 NOTE — Telephone Encounter (Signed)
Pt c/o medication issue:  1. Name of Medication:   dapagliflozin propanediol (FARXIGA) 10 MG TABS tablet    2. How are you currently taking this medication (dosage and times per day)? Take 1 tablet (10 mg total) by mouth daily before breakfast  3. Are you having a reaction (difficulty breathing--STAT)? No  4. What is your medication issue? Pt states that medication is getting too expensive and would like for a prescription to be sent to the [facility]. Please advise

## 2021-10-10 NOTE — Telephone Encounter (Signed)
Refill for Farxiga sent to the patients [place] pharmacy as requested.

## 2021-10-13 ENCOUNTER — Other Ambulatory Visit: Payer: Medicare Other

## 2021-10-15 ENCOUNTER — Ambulatory Visit (HOSPITAL_COMMUNITY)
Admission: RE | Admit: 2021-10-15 | Discharge: 2021-10-15 | Disposition: A | Discharge status: Home / Self Care | Payer: Medicare Other | Source: Ambulatory Visit | Attending: Cardiovascular Disease | Admitting: Cardiovascular Disease

## 2021-10-15 ENCOUNTER — Telehealth: Payer: Self-pay | Admitting: Cardiovascular Disease

## 2021-10-15 DIAGNOSIS — I350 Nonrheumatic aortic (valve) stenosis: Secondary | ICD-10-CM | POA: Insufficient documentation

## 2021-10-15 MED ORDER — IOHEXOL 350 MG/ML SOLN
100.0000 mL | Freq: Once | INTRAVENOUS | Status: AC | PRN
  Administered 2021-10-15: 100 mL via INTRAVENOUS

## 2021-10-15 NOTE — Telephone Encounter (Signed)
Raquel Sarna, pt daughter called wanting to know how long after the initial appt on 11/19/21  will the pt have surgery on his valve.

## 2021-10-16 NOTE — Telephone Encounter (Signed)
I spoke with Curtis Campos and discussed tentative dates for TAVR. I advised her that the pt's testing will be reviewed to determine if the pt is a candidate.

## 2021-11-11 ENCOUNTER — Other Ambulatory Visit: Payer: Self-pay | Admitting: Specialist

## 2021-11-12 ENCOUNTER — Other Ambulatory Visit: Payer: Self-pay | Admitting: Specialist

## 2021-11-12 DIAGNOSIS — R911 Solitary pulmonary nodule: Secondary | ICD-10-CM

## 2021-11-14 ENCOUNTER — Encounter
Admission: RE | Admit: 2021-11-14 | Discharge: 2021-11-14 | Disposition: A | Discharge status: Home / Self Care | Payer: Medicare Other | Source: Ambulatory Visit | Attending: Specialist | Admitting: Specialist

## 2021-11-14 DIAGNOSIS — R911 Solitary pulmonary nodule: Secondary | ICD-10-CM | POA: Insufficient documentation

## 2021-11-14 LAB — GLUCOSE, CAPILLARY: Glucose-Capillary: 96 mg/dL (ref 70–99)

## 2021-11-14 MED ORDER — FLUDEOXYGLUCOSE F - 18 (FDG) INJECTION
9.1000 | Freq: Once | INTRAVENOUS | Status: AC | PRN
  Administered 2021-11-14: 9.51 via INTRAVENOUS

## 2021-11-18 ENCOUNTER — Other Ambulatory Visit: Payer: Medicare Other

## 2021-11-19 ENCOUNTER — Other Ambulatory Visit: Payer: Self-pay | Admitting: Surgery

## 2021-11-19 ENCOUNTER — Other Ambulatory Visit: Payer: Self-pay

## 2021-11-19 ENCOUNTER — Institutional Professional Consult (permissible substitution) (INDEPENDENT_AMBULATORY_CARE_PROVIDER_SITE_OTHER): Payer: Medicare Other | Admitting: Surgery

## 2021-11-19 VITALS — BP 117/68 | HR 59 | Resp 18 | Ht 66.0 in | Wt 175.0 lb

## 2021-11-19 DIAGNOSIS — I350 Nonrheumatic aortic (valve) stenosis: Secondary | ICD-10-CM | POA: Diagnosis not present

## 2021-11-19 NOTE — Progress Notes (Signed)
Pre Surgical Assessment: 5 M Walk Test  65M=16.43ft  5 Meter Walk Test- trial 1: 0 seconds 5 Meter Walk Test- trial 2: 0 seconds 5 Meter Walk Test- trial 3: 0 seconds 5 Meter Walk Test Average: 0 seconds    Patient presented in wheel chair and states he does not ambulate much. Unable to do test.

## 2021-11-19 NOTE — Progress Notes (Signed)
Patient ID: Curtis Campos, male   DOB: 01-28-1941, 81 y.o.   MRN: [id number]  [facility]IN[facility]        [address]       [place] [address]             [phone]          CARDIOTHORACIC SURGERY CONSULTATION REPORT  PCP is Rusty Aus, MD Referring Provider is Sherren Mocha, MD Primary Cardiologist is Kathlyn Sacramento, MD  Reason for consultation:  Severe aortic stenosis  HPI:  The patient is an 81 year old gentleman with a history of hypertension, hyperlipidemia, ischemic cardiomyopathy,HFrEF, coronary artery disease, previous smoking with COPD on home oxygen, degenerative arthritis with reduced functional capacity, moderate mitral regurgitation, and severe aortic stenosis who was referred for consideration of TAVR.  He has a history of moderate aortic stenosis dating back to 2017 when his ejection fraction was 30 to 35%.  His ejection fraction was improved in 2022 to 55% but is now declined again to 40 to 45%.  2D echocardiogram in February 2023 showed a severely calcified aortic valve with restricted leaflet mobility.  The mean gradient was only 22 mmHg with a peak gradient of 36 mmHg but the dimensionless index was 0.19 with a valve area of 0.8 cm and a stroke-volume index of 39 suggesting low flow/low gradient severe aortic stenosis.  He was initially reluctant to proceed with further work-up for TAVR but has developed progressive exertional fatigue and shortness of breath as well as orthopnea.  His symptoms have improved on Lasix.    Past Medical History:  Diagnosis Date   Agent orange exposure    Arthritis 03/12/2011   CHF (congestive heart failure) (HCC)    Chronic obstructive lung disease ([place])    Coronary artery disease 03/2013   a. 03/2013 Cath: LM nl, LAD 37m, D1 50, D2 30, LCX 66m/d (chronic), OM1 50, RCA 40p, 89m, RPL 40, RPDA 60, EF 25%, mod-sev MR-->Med mgmt; b. 08/2019 MV: basal inflat/mid inflat  defect consistent w/ ischemia in LCX distribution. EF 37%. Intermediate risk.   HFrEF (heart failure with reduced ejection fraction) ([place])    a. 03/2013 TEE: EF 25%; b. 08/2017 Echo: EF 45-50%, Gr1 DD. Mod AS. Mod MR. Mildly dil LA; c. 06/2019 Echo: EF 30-35%, glob HK, gr1 DD; d. 06/2020 Echo: EF 55-60%, nl RV fxn, triv MR, Mod AS.   Hyperlipidemia    Hypertension    Ischemic cardiomyopathy    a. 03/2013 TEE: EF 25%; b. 08/2017 Echo: EF 45-50%; c. 06/2019 Echo: EF 30-35%; d. 06/2020 Echo: EF 55-60%.   Moderate aortic stenosis    a. 08/2017 Echo: Mod AS. Mean grad (S) 28mmHg. Valve area (VTI): 1.25cm^2; b. 06/2019 Echo: Mod AS. Mean grad 32mmHg, Valve area (VTI): 1.16cm^2); c. 06/2020 Mod AS. AVA 1.25cm^2, mean grad 74mmHg.   Moderate mitral regurgitation    a. 08/2017 Echo: mod MR; b. 06/2019 Echo: mild to mod MR.   Rectal cancer [facility])     Past Surgical History:  Procedure Laterality Date   APPENDECTOMY  06/2003   CARDIAC CATHETERIZATION  03/2013   [facility]   cataract surgery     COLOSTOMY     HERNIA REPAIR     resection of rectum     RIGHT/LEFT HEART CATH AND CORONARY ANGIOGRAPHY N/A 08/27/2021   Procedure: RIGHT/LEFT HEART CATH AND CORONARY ANGIOGRAPHY;  Surgeon: Sherren Mocha, MD;  Location: [facility] [place];  Service: Cardiovascular;  Laterality: N/A;    Family History  Problem Relation Age of Onset   Heart disease Mother    Heart disease Father    Hyperlipidemia Father    Hypertension Father    Heart attack Brother 57       MI   Heart disease Sister        stents placed    Cancer - Cervical Sister    Heart disease Sister        CABG    Social History   Socioeconomic History   Marital status: Married    Spouse name: Not on file   Number of children: Not on file   Years of education: Not on file   Highest education level: Not on file  Occupational History   Not on file  Tobacco Use   Smoking status: Former    Packs/day: 1.00    Years: 50.00    Total pack years: 50.00     Types: Cigarettes    Quit date: 03/25/2013    Years since quitting: 8.6   Smokeless tobacco: Never  Vaping Use   Vaping Use: Never used  Substance and Sexual Activity   Alcohol use: No    Alcohol/week: 0.0 standard drinks of alcohol   Drug use: No   Sexual activity: Yes  Other Topics Concern   Not on file  Social History Narrative   Arm '66-'68, [place], agent orange exposure.    Social Determinants of Health   Financial Resource Strain: Not on file  Food Insecurity: Not on file  Transportation Needs: Not on file  Physical Activity: Not on file  Stress: Not on file  Social Connections: Not on file  Intimate Partner Violence: Not on file    Prior to Admission medications   Medication Sig Start Date End Date Taking? Authorizing Provider  albuterol (VENTOLIN HFA) 108 (90 Base) MCG/ACT inhaler INHALE 2 PUFFS BY MOUTH EVERY 6 HOURS AS NEEDED FOR SHORTNESS OF BREATH 08/29/18  Yes [name], NP  Alirocumab (PRALUENT) 75 MG/ML SOAJ Inject 1 pen into the skin every 14 (fourteen) days. Patient taking differently: Inject 75 mg into the skin every 14 (fourteen) days. 08/25/18  Yes Wellington Hampshire, MD  aspirin 81 MG tablet Take 81 mg by mouth at bedtime.   Yes [provider]  carvedilol (COREG) 6.25 MG tablet Take 1 tablet (6.25 mg total) by mouth 2 (two) times daily. Patient taking differently: Take 3.125 mg by mouth 2 (two) times daily. 06/17/21  Yes Furth, Cadence H, PA-C  cholecalciferol (VITAMIN D3) 25 MCG (1000 UNIT) tablet Take 1,000 Units by mouth daily.   Yes [provider]  dapagliflozin propanediol (FARXIGA) 10 MG TABS tablet Take 1 tablet (10 mg total) by mouth daily before breakfast. 10/10/21  Yes Furth, Cadence H, PA-C  ezetimibe (ZETIA) 10 MG tablet Take 1 tablet by mouth once daily for cholesterol. NEED APPOINTMENT FOR ANY MORE REFILLS 08/29/18  Yes [name], NP  fluticasone-salmeterol (ADVAIR) 250-50 MCG/ACT AEPB 1 puff daily. 03/05/21  Yes  [provider]  furosemide (LASIX) 20 MG tablet TAKE 1 TABLET BY MOUTH ONCE DAILY. Patient taking differently: Take 20 mg by mouth at bedtime. 02/13/21  Yes Wellington Hampshire, MD  guaiFENesin (ROBITUSSIN) 100 MG/5ML liquid Take 10 mLs by mouth every 4 (four) hours as needed for cough or to loosen phlegm. 03/15/21  Yes Barb Merino, MD  Magnesium Oxide 400 (240 Mg) MG TABS TAKE 1 TABLET BY  MOUTH ONCE DAILY. 05/03/17  Yes Wellington Hampshire, MD  sacubitril-valsartan (ENTRESTO) 49-51 MG Take 0.5 tablets by mouth 2 (two) times daily.   Yes [provider]  traZODone (DESYREL) 50 MG tablet Take 1 tablet (50 mg total) by mouth at bedtime as needed. for sleep 08/29/18  Yes [name], NP  TRELEGY ELLIPTA 100-62.5-25 MCG/ACT AEPB Inhale 1 puff into the lungs daily. 04/16/21  Yes [provider]    Current Outpatient Medications  Medication Sig Dispense Refill   albuterol (VENTOLIN HFA) 108 (90 Base) MCG/ACT inhaler INHALE 2 PUFFS BY MOUTH EVERY 6 HOURS AS NEEDED FOR SHORTNESS OF BREATH 18 g 0   Alirocumab (PRALUENT) 75 MG/ML SOAJ Inject 1 pen into the skin every 14 (fourteen) days. (Patient taking differently: Inject 75 mg into the skin every 14 (fourteen) days.) 2 pen 11   aspirin 81 MG tablet Take 81 mg by mouth at bedtime.     carvedilol (COREG) 6.25 MG tablet Take 1 tablet (6.25 mg total) by mouth 2 (two) times daily. (Patient taking differently: Take 3.125 mg by mouth 2 (two) times daily.) 180 tablet 3   cholecalciferol (VITAMIN D3) 25 MCG (1000 UNIT) tablet Take 1,000 Units by mouth daily.     dapagliflozin propanediol (FARXIGA) 10 MG TABS tablet Take 1 tablet (10 mg total) by mouth daily before breakfast. 90 tablet 1   ezetimibe (ZETIA) 10 MG tablet Take 1 tablet by mouth once daily for cholesterol. NEED APPOINTMENT FOR ANY MORE REFILLS 90 tablet 1   fluticasone-salmeterol (ADVAIR) 250-50 MCG/ACT AEPB 1 puff daily.     furosemide (LASIX) 20 MG tablet TAKE 1 TABLET  BY MOUTH ONCE DAILY. (Patient taking differently: Take 20 mg by mouth at bedtime.) 30 tablet 11   guaiFENesin (ROBITUSSIN) 100 MG/5ML liquid Take 10 mLs by mouth every 4 (four) hours as needed for cough or to loosen phlegm. 120 mL 0   Magnesium Oxide 400 (240 Mg) MG TABS TAKE 1 TABLET BY MOUTH ONCE DAILY. 90 tablet 3   sacubitril-valsartan (ENTRESTO) 49-51 MG Take 0.5 tablets by mouth 2 (two) times daily.     traZODone (DESYREL) 50 MG tablet Take 1 tablet (50 mg total) by mouth at bedtime as needed. for sleep 90 tablet 0   TRELEGY ELLIPTA 100-62.5-25 MCG/ACT AEPB Inhale 1 puff into the lungs daily.     No current facility-administered medications for this visit.    Allergies  Allergen Reactions   Codeine Nausea Only   Statins Other (See Comments)    REACTION: maylgias      Review of Systems:   General:  normal appetite, + decreased energy, + weight gain, no weight loss, no fever  Cardiac:  no chest pain with exertion, no chest pain at rest, +SOB with mild exertion, + resting SOB, no PND, + orthopnea, no palpitations, no arrhythmia, no atrial fibrillation, no LE edema, no dizzy spells, no syncope  Respiratory:  +  shortness of breath, + home oxygen, + productive cough, no dry cough, + bronchitis, no wheezing, no hemoptysis, no asthma, no pain with inspiration or cough, no sleep apnea, no CPAP at night  GI:   no difficulty swallowing, no reflux, no frequent heartburn, no hiatal hernia, no abdominal pain, no constipation, no diarrhea, no hematochezia, no hematemesis, no melena  GU:   no dysuria,  no frequency, no urinary tract infection, no hematuria, no enlarged prostate, no kidney stones, no kidney disease  Vascular:  no pain suggestive of claudication, no  pain in feet, no leg cramps, no varicose veins, no DVT, no non-healing foot ulcer  Neuro:   no stroke, no TIA's, no seizures, no headaches, no temporary blindness one eye,  no slurred speech, no peripheral neuropathy, no chronic pain, no  instability of gait, no memory/cognitive dysfunction  Musculoskeletal: + arthritis, + joint swelling, +myalgias, + difficulty walking, + reduced mobility   Skin:   no rash, no itching, no skin infections, no pressure sores or ulcerations  Psych:   no anxiety, no depression, no nervousness, no unusual recent stress  Eyes:   no blurry vision, + floaters, no recent vision changes, + wears glasses  ENT:   no hearing loss, no loose or painful teeth, working on getting dentures  Hematologic:  + easy bruising, no abnormal bleeding, no clotting disorder, no frequent epistaxis  Endocrine:  no diabetes, does not check CBG's at home     Physical Exam:   Ht 5\' 6"  (1.676 m)   BMI 28.25 kg/m   General:  Elderly,  well-appearing  HEENT:  Unremarkable, NCAT, PERLA, EOMI,   Neck:   no JVD, no bruits, no adenopathy   Chest:   clear to auscultation, symmetrical breath sounds, no wheezes, no rhonchi   CV:   RRR, 3/6 systolic murmur RSB, no diastolic murmur  Abdomen:  soft, non-tender, no masses   Extremities:  warm, well-perfused, pulses palpable at ankles, no lower extremity edema  Rectal/GU  Deferred  Neuro:   Grossly non-focal and symmetrical throughout  Skin:   Clean and dry, no rashes, no breakdown  Diagnostic Tests:    ECHOCARDIOGRAM REPORT         Patient Name:   SALBADOR FIVEASH Date of Exam: 05/21/2021  Medical Rec #:  [id number]        Height:       66.0 in  Accession #:    [id number]       Weight:       166.0 lb  Date of Birth:  07/19/1940        BSA:          1.848 m  Patient Age:    48 years         BP:           100/66 mmHg  Patient Gender: M                HR:           70 bpm.  Exam Location:  [facility]   Procedure: 2D Echo, Cardiac Doppler and Color Doppler   Indications:    I35.0 Nonrheumatic aortic (valve) stenosis     History:        Patient has prior history of Echocardiogram examinations,  most                  recent 07/23/2020. CHF and Cardiomyopathy, CAD, COPD,   Aortic                  Valve Disease and Mitral Valve Disease,  Signs/Symptoms:Shortness                  of Breath; Risk Factors:Former Smoker, Hypertension and                  Dyslipidemia.     Sonographer:    Pilar Jarvis RDMS, RVT, RDCS  Sonographer#2:  Wilkie Aye RVT  Referring Phys: Crystal Comments:  Technically challenging study due to limited  acoustic windows. Patient was suffering from a rib injury and had a great  deal of trouble tolerating transducer pressure at the apical windows  IMPRESSIONS     1. Left ventricular ejection fraction, by estimation, is 40 to 45%. Left  ventricular ejection fraction by 2D MOD biplane is 42.8 %. The left  ventricle has mild to moderately decreased function. The left ventricle  has no regional wall motion  abnormalities. Left ventricular diastolic parameters are consistent with  Grade I diastolic dysfunction (impaired relaxation).   2. Right ventricular systolic function is normal. The right ventricular  size is normal.   3. Left atrial size was moderately dilated.   4. The mitral valve is degenerative. Mild to moderate mitral valve  regurgitation. Moderate mitral annular calcification.   5. Aortic valve mean gradient 67mmHg, peak gradient 21mmHg, AVA 0.8cm2,  DVI 0.19. suggesting low flow-low gradient severe aortic stenosis.  consider low dose dobutamine study for further eval.. The aortic valve is  calcified. Aortic valve regurgitation is   not visualized. Severe aortic valve stenosis.   6. The inferior vena cava is normal in size with greater than 50%  respiratory variability, suggesting right atrial pressure of 3 mmHg.   Comparison(s): Previous AS pressure gradients were, 39mmHg max, 60mmHg  mean.   FINDINGS   Left Ventricle: Left ventricular ejection fraction, by estimation, is 40  to 45%. Left ventricular ejection fraction by 2D MOD biplane is 42.8 %.  The left ventricle has mild to  moderately decreased function. The left  ventricle has no regional wall motion   abnormalities. The left ventricular internal cavity size was normal in  size. There is no left ventricular hypertrophy. Left ventricular diastolic  parameters are consistent with Grade I diastolic dysfunction (impaired  relaxation).   Right Ventricle: The right ventricular size is normal. No increase in  right ventricular wall thickness. Right ventricular systolic function is  normal.   Left Atrium: Left atrial size was moderately dilated.   Right Atrium: Right atrial size was normal in size.   Pericardium: There is no evidence of pericardial effusion.   Mitral Valve: The mitral valve is degenerative in appearance. Moderate  mitral annular calcification. Mild to moderate mitral valve regurgitation.   Tricuspid Valve: The tricuspid valve is normal in structure. Tricuspid  valve regurgitation is mild.   Aortic Valve: Aortic valve mean gradient 35mmHg, peak gradient 49mmHg, AVA  0.8cm2, DVI 0.19. suggesting low flow-low gradient severe aortic stenosis.  consider low dose dobutamine study for further eval. The aortic valve is  calcified. Aortic valve  regurgitation is not visualized. Severe aortic stenosis is present. Aortic  valve mean gradient measures 22.0 mmHg. Aortic valve peak gradient  measures 39.1 mmHg. Aortic valve area, by VTI measures 0.79 cm.   Pulmonic Valve: The pulmonic valve was not well visualized. Pulmonic valve  regurgitation is trivial.   Aorta: The aortic root is normal in size and structure.   Venous: The inferior vena cava is normal in size with greater than 50%  respiratory variability, suggesting right atrial pressure of 3 mmHg.   IAS/Shunts: No atrial level shunt detected by color flow Doppler.      LEFT VENTRICLE  PLAX 2D                        Biplane EF (MOD)  LVIDd:         5.60 cm  LV Biplane EF:   Left  LVIDs:         4.30 cm                           ventricular  LV PW:         0.80 cm                          ejection  LV IVS:        0.90 cm                          fraction by  LVOT diam:     2.30 cm                          2D MOD  LV SV:         62                               biplane is  LV SV Index:   33                               42.8 %.  LVOT Area:     4.15 cm                                 Diastology                                 LV e' medial:    3.92 cm/s  LV Volumes (MOD)               LV E/e' medial:  20.4  LV vol d, MOD    131.0 ml      LV e' lateral:   7.83 cm/s  A2C:                           LV E/e' lateral: 10.2  LV vol d, MOD    107.0 ml  A4C:  LV vol s, MOD    68.5 ml  A2C:  LV vol s, MOD    65.6 ml  A4C:  LV SV MOD A2C:   62.5 ml  LV SV MOD A4C:   107.0 ml  LV SV MOD BP:    51.0 ml   RIGHT VENTRICLE             IVC  RV Basal diam:  3.40 cm     IVC diam: 1.80 cm  RV S prime:     11.30 cm/s  TAPSE (M-mode): 2.4 cm   LEFT ATRIUM            Index        RIGHT ATRIUM           Index  LA diam:      4.40 cm  2.38 cm/m   RA Area:     19.90 cm  LA Vol (A2C): 115.0 ml 62.24 ml/m  RA Volume:   60.00 ml  32.48 ml/m  LA Vol (A4C): 45.4 ml  24.57 ml/m  AORTIC VALVE                     PULMONIC VALVE  AV Area (Vmax):    0.94 cm      PV Vmax:          0.93 m/s  AV Area (Vmean):   0.87 cm      PV Peak grad:     3.5 mmHg  AV Area (VTI):     0.79 cm      PR End Diast Vel: 1.58 msec  AV Vmax:           312.50 cm/s  AV Vmean:          211.000 cm/s  AV VTI:            0.777 m  AV Peak Grad:      39.1 mmHg  AV Mean Grad:      22.0 mmHg  LVOT Vmax:         70.57 cm/s  LVOT Vmean:        44.367 cm/s  LVOT VTI:          0.148 m  LVOT/AV VTI ratio: 0.19     AORTA  Ao Root diam: 2.90 cm  Ao Asc diam:  3.40 cm  Ao Arch diam: 2.8 cm   MITRAL VALVE                TRICUSPID VALVE  MV Area (PHT): 2.10 cm     TR Peak grad:   36.2 mmHg  MV Decel Time: 362 msec     TR Vmax:        301.00 cm/s  MV E velocity:  79.80 cm/s  MV A velocity: 108.00 cm/s  SHUNTS  MV E/A ratio:  0.74         Systemic VTI:  0.15 m                              Systemic Diam: 2.30 cm   Kate Sable MD  Electronically signed by Kate Sable MD  Signature Date/Time: 05/21/2021/5:39:47 PM         Final     Physicians  Panel Physicians Referring Physician Case Authorizing Physician  Sherren Mocha, MD (Primary)     Procedures  RIGHT/LEFT HEART CATH AND CORONARY ANGIOGRAPHY   Conclusion  1.  Severe single-vessel coronary artery disease with severe stenosis of the mid circumflex unchanged from the previous cath study in 2015 2.  Mild nonobstructive LAD plaquing and mild ostial diagonal stenoses 3.  Mild nonobstructive mid RCA stenosis and severe distal PDA stenosis unchanged from previous cath study 4.  Severe low-flow low gradient aortic stenosis with mean transvalvular gradient of 26 mmHg, aortic valve is calcified and restricted on plain fluoroscopy   Plan: Continue TAVR evaluation, medical therapy for CAD   Indications  Severe aortic stenosis [I35.0 (ICD-10-CM)]   Procedural Details  Technical Details INDICATION: 81 year old male with O2 dependent COPD and severe low-flow low gradient aortic stenosis presents today for cardiac catheterization as part of his preop evaluation for aortic stenosis.  PROCEDURAL DETAILS: There was an indwelling IV in a right antecubital vein. Using normal sterile technique, the IV was changed out for a 5 Fr brachial sheath over a 0.018 inch wire. The right wrist was then prepped, draped, and anesthetized with 1% lidocaine. Using the modified Seldinger technique a 5/6 French Slender sheath was placed  in the right radial artery. Intra-arterial verapamil was administered through the radial artery sheath. IV heparin was administered after a JR4 catheter was advanced into the central aorta. A Swan-Ganz catheter was used for the right heart catheterization. Standard protocol was  followed for recording of right heart pressures and sampling of oxygen saturations. Fick cardiac output was calculated. Standard Judkins catheters were used for selective coronary angiography. LV pressure is recorded and an aortic valve pullback is performed. There were no immediate procedural complications. The patient was transferred to the post catheterization recovery area for further monitoring.      Estimated blood loss <50 mL.   During this procedure medications were administered to achieve and maintain moderate conscious sedation while the patient's heart rate, blood pressure, and oxygen saturation were continuously monitored and I was present face-to-face 100% of this time.   Medications (Filter: Administrations occurring from 1423 to 1527 on 08/27/21)  important  Continuous medications are totaled by the amount administered until 08/27/21 1527.   midazolam (VERSED) injection (mg) Total dose:  1 mg  Date/Time Rate/Dose/Volume Action   08/27/21 1443 1 mg Given    fentaNYL (SUBLIMAZE) injection (mcg) Total dose:  25 mcg  Date/Time Rate/Dose/Volume Action   08/27/21 1444 25 mcg Given    lidocaine (PF) (XYLOCAINE) 1 % injection (mL) Total volume:  4 mL  Date/Time Rate/Dose/Volume Action   08/27/21 1454 2 mL Given   1457 2 mL Given    Radial Cocktail/Verapamil only (mL) Total volume:  10 mL  Date/Time Rate/Dose/Volume Action   08/27/21 1457 10 mL Given    heparin sodium (porcine) injection (Units) Total dose:  4,000 Units  Date/Time Rate/Dose/Volume Action   08/27/21 1502 4,000 Units Given    iohexol (OMNIPAQUE) 350 MG/ML injection (mL) Total volume:  45 mL  Date/Time Rate/Dose/Volume Action   08/27/21 1516 45 mL Given    Heparin (Porcine) in NaCl 1000-0.9 UT/500ML-% SOLN (mL) Total volume:  1,000 mL  Date/Time Rate/Dose/Volume Action   08/27/21 1516 500 mL Given   1516 500 mL Given    Sedation Time  Sedation Time Physician-1: 30 minutes 59  seconds Contrast  Medication Name Total Dose  iohexol (OMNIPAQUE) 350 MG/ML injection 45 mL   Radiation/Fluoro  Fluoro time: 5.8 (min) DAP: 11162 (mGycm2) Cumulative Air Kerma: 702 (mGy) Complications  Complications documented before study signed (08/27/2021  6:37 PM)   No complications were associated with this study.  Documented by Elveria Royals, RT - 08/27/2021  3:19 PM     Coronary Findings  Diagnostic Dominance: Right Left Main  The vessel exhibits minimal luminal irregularities.    Left Anterior Descending  There is mild diffuse disease throughout the vessel.    First Diagonal Branch  1st Diag lesion is 50% stenosed.    Second Diagonal Branch  2nd Diag lesion is 50% stenosed.    Left Circumflex  Mid Cx lesion is 90% stenosed. Severe stenosis unchanged from the previous study in 2015    Right Coronary Artery  Prox RCA to Mid RCA lesion is 40% stenosed. The lesion is moderately calcified.    Right Posterior Descending Artery  RPDA lesion is 80% stenosed.    Intervention   No interventions have been documented.   Coronary Diagrams  Diagnostic Dominance: Right  Intervention  Implants     No implant documentation for this case.   Syngo Images   Show images for CARDIAC CATHETERIZATION Images on Long Term Storage   Show images for [place], [place]  H Link to Procedure Log  Procedure Log    Hemo Data  Flowsheet Row Most Recent Value  Fick Cardiac Output 6.03 L/min  Fick Cardiac Output Index 3.2 (L/min)/BSA  Aortic Mean Gradient 26.18 mmHg  Aortic Peak Gradient 20.9 mmHg  Aortic Valve Area 1.31  Aortic Value Area Index 0.69 cm2/BSA  RA A Wave 8 mmHg  RA V Wave 5 mmHg  RA Mean 3 mmHg  RV Systolic Pressure 31 mmHg  RV Diastolic Pressure 1 mmHg  RV EDP 7 mmHg  PA Systolic Pressure 31 mmHg  PA Diastolic Pressure 11 mmHg  PA Mean 22 mmHg  PW A Wave 9 mmHg  PW V Wave 6 mmHg  PW Mean 4 mmHg  AO Systolic Pressure 109 mmHg  AO Diastolic  Pressure 66 mmHg  AO Mean 93 mmHg  LV Systolic Pressure 323 mmHg  LV Diastolic Pressure -2 mmHg  LV EDP 9 mmHg  AOp Systolic Pressure 557 mmHg  AOp Diastolic Pressure 63 mmHg  AOp Mean Pressure 93 mmHg  LVp Systolic Pressure 322 mmHg  LVp Diastolic Pressure -3 mmHg  LVp EDP Pressure 12 mmHg  QP/QS 1  TPVR Index 6.88 HRUI  TSVR Index 29.09 HRUI  PVR SVR Ratio 0.2  TPVR/TSVR Ratio 0.24    ADDENDUM REPORT: 10/15/2021 11:30   CLINICAL DATA:  Aortic stenosis   EXAM: Cardiac TAVR CT   TECHNIQUE: The patient was scanned on a Siemens Force 025 slice scanner. A 120 kV retrospective scan was triggered in the descending thoracic aorta at 111 HU's. Gantry rotation speed was 270 msecs and collimation was .9 mm. No beta blockade or nitro were given. The 3D data set was reconstructed in 5% intervals of the R-R cycle. Systolic and diastolic phases were analyzed on a dedicated work station using MPR, MIP and VRT modes. The patient received 80 cc of contrast.   FINDINGS: Aortic Valve: Calcified tri leaflet valve with score 2208   Aorta: No aneurysm normal arch vessels mild calcific atherosclerosis   Sinotubular Junction: 29.1 mm   Ascending Thoracic Aorta: 35 mm   Aortic Arch: 29 mm   Descending Thoracic Aorta: 29 mm   Sinus of [place] Measurements:   Non-coronary: 32.1 mm   Right - coronary: 31 mm  Height 19.6 mm   Left - coronary: 33.2 mm  Height 19.2 mm   Coronary Artery Height above Annulus:   Left Main: 16.8 mm above annulus   Right Coronary: 14.7 mm above annulus   Virtual Basal Annulus Measurements:   Maximum/Minimum Diameter: 29.3 mm x 24.9 mm   Perimeter: 87.2 mm   Area: 555 mm2   Coronary Arteries: Sufficient height above annulus for deployment   Optimum Fluoroscopic Angle for Delivery: LAO 29 Caudal 9 degrees   IMPRESSION: 1. Calcified tri leaflet AV with score 2208   2. Optimum angiographic angle for deployment LAO 29 Caudal 9 degrees   3.  Annular area 555 mm2 Lower range for 29 mm Sapien 3 valve Sinus length fine some concern for somewhat small calcified STJ   4.  Coronary height sufficient for deployment   5.  Membranous septal length 9 mm   Jenkins Rouge     Electronically Signed   By: Jenkins Rouge M.D.   On: 10/15/2021 11:30    Addended by Josue Hector, MD on 10/15/2021 11:32 AM   Study Result  Narrative & Impression  EXAM: OVER-READ INTERPRETATION  CT CHEST   The following report is a limited chest  CT over-read performed by radiologist Dr. Yetta Glassman of [facility], [place] on 10/15/2021. This over-read does not include interpretation of cardiac or coronary anatomy or pathology. The cardiac CTA interpretation by the cardiologist is attached.   COMPARISON:  None Available.   FINDINGS: Extracardiac findings will be described separately under dictation for contemporaneously obtained CTA chest, abdomen and pelvis.   IMPRESSION: Please see separate dictation for contemporaneously obtained CTA chest, abdomen and pelvis dated 10/15/2021 for full description of relevant extracardiac findings.   Electronically Signed: By: Yetta Glassman M.D. On: 10/15/2021 10:59    Narrative & Impression  CLINICAL DATA:  Aortic valve replacement preop evaluation   EXAM: CT ANGIOGRAPHY CHEST, ABDOMEN AND PELVIS   TECHNIQUE: Multidetector CT imaging through the chest, abdomen and pelvis was performed using the standard protocol during bolus administration of intravenous contrast. Multiplanar reconstructed images and MIPs were obtained and reviewed to evaluate the vascular anatomy.   RADIATION DOSE REDUCTION: This exam was performed according to the departmental dose-optimization program which includes automated exposure control, adjustment of the mA and/or kV according to patient size and/or use of iterative reconstruction technique.   CONTRAST:  112mL OMNIPAQUE IOHEXOL 350 MG/ML SOLN   COMPARISON:   None Available.   FINDINGS: CTA CHEST FINDINGS   Cardiovascular: Normal heart size. Pericardial effusion. Left main and three-vessel coronary artery calcifications. Normal caliber thoracic aorta with moderate atherosclerotic disease. Aortic valve thickening and calcifications.   Mediastinum/Nodes: Esophagus and thyroid are unremarkable. Mildly enlarged subcarinal lymph node, measuring up to 13 mm in short axis, likely reactive.   Lungs/Pleura: Central airways are patent. Centrilobular emphysema. Biapical pleural-parenchymal scarring, unchanged when compared with prior exam. Linear opacity of the lingula, likely due to scarring or atelectasis. Solid nodule of the posterior left upper lobe measuring 8 mm on series 5 image 248, slightly increased in size when compared with March 10, 2021 prior exam, previously measured 6 mm.   Musculoskeletal: Multiple old left-sided rib fractures. No aggressive appearing osseous lesions.   CTA ABDOMEN AND PELVIS FINDINGS   Hepatobiliary: No focal liver abnormality. Gallstones with no gallbladder wall thickening. No biliary ductal dilation.   Pancreas: Unremarkable. No pancreatic ductal dilatation or surrounding inflammatory changes.   Spleen: Normal in size without focal abnormality.   Adrenals/Urinary Tract: Bilateral adrenal glands are unremarkable. Kidneys enhance symmetrically with no hydronephrosis. Bilateral nonobstructing renal stones. Bladder is unremarkable.   Stomach/Bowel: Normal appearance of the stomach. Partial colectomy with left lower quadrant colostomy. No bowel wall thickening inflammatory change or evidence of obstruction.   Vascular/lymphatic: Severe atherosclerotic disease of the abdominal aorta. Moderate narrowing at the origin of the celiac artery and left renal watery, otherwise no significant stenosis of the branch vessels. Mildly dilated infrarenal abdominal aorta which not meet criteria for aneurysm. No  pathologically enlarged lymph nodes seen in the abdomen or pelvis.   Reproductive: TURP defect of the prostate.   Other: Small left-greater-than-right fat containing inguinal hernias. Small fat containing hernia of the ventral abdominal wall which is slightly right of midline.   Musculoskeletal: Mild age indeterminate compression deformity of L3.   VASCULAR MEASUREMENTS PERTINENT TO TAVR:   AORTA:   Minimal Aortic Diameter-12.4 mm   Severity of Aortic Calcification-severe   RIGHT PELVIS:   Right Common Iliac Artery -   Minimal Diameter-8.4 mm   Tortuosity-none   Calcification-severe   Right External Iliac Artery -   Minimal Diameter-0.8 mm   Tortuosity-mild   Calcification-severe   Right Common Femoral Artery -  Minimal Diameter-6.2 mm   Tortuosity-none   Calcification-severe   LEFT PELVIS:   Left Common Iliac Artery -   Minimal Diameter-6.7 mm   Tortuosity-none   Calcification-severe   Left External Iliac Artery -   Minimal Diameter-5.5 mm   Tortuosity-moderate   Calcification-severe   Left Common Femoral Artery -   Minimal Diameter-5.5 mm   Tortuosity-none   Calcification-severe   Review of the MIP images confirms the above findings.   IMPRESSION: Vascular:   1. Vascular findings and measurements pertinent to potential TAVR procedure, as detailed above. 2. Thickening and calcification of the aortic valve, compatible with reported clinical history of aortic stenosis. 3. Severe aortoiliac atherosclerosis. Left main and 3 vessel coronary artery disease.   Nonvascular:   1. Subcentimeter solid pulmonary nodule of the left upper lobe is slightly increased in size when compared with March 10, 2021 prior, measures 8 mm, previously 6 mm. Additional imaging evaluation or consultation with Pulmonology or Thoracic Surgery recommended. 2. Mild L3 age indeterminate compression deformity. Correlate for point tenderness. 3. Emphysema  (ICD10-J43.9).     Electronically Signed   By: Yetta Glassman M.D.   On: 10/15/2021 11:44       Impression:  This 81 year old gentleman has stage D2, low-flow/low gradient severe aortic stenosis with NYHA class II symptoms of exertional fatigue and shortness of breath consistent with chronic diastolic congestive heart failure.  I have personally reviewed his 2D echocardiogram, cardiac catheterization, and CTA studies.  His echocardiogram shows a severely calcified and thickened aortic valve with restricted leaflet mobility.  The mean gradient was 22 mmHg with a valve area of 0.79 cm and dimensionless index of 0.19.  Cardiac catheterization showed severe stenosis of the mid left circumflex that is a relatively small vessel and unchanged from previous catheterization in 2015.  There is otherwise mild nonobstructive disease.  The mean transvalvular gradient was measured at 26 mmHg.  I agree that aortic valve replacement is indicated for relief of his symptoms and to prevent further left ventricular dysfunction.  Given his advanced age, oxygen dependent COPD, and restricted mobility due to degenerative arthritis I do not think he is a candidate for open surgical aortic valve replacement.  Transcatheter aortic valve replacement would be a reasonable option.  His gated cardiac CTA shows anatomy suitable for TAVR using a SAPIEN 3 valve.  His abdominal and pelvic CTA shows adequate pelvic vascular anatomy to allow transfemoral insertion.  The patient was counseled at length regarding treatment alternatives for management of severe symptomatic aortic stenosis. The risks and benefits of surgical intervention has been discussed in detail. Long-term prognosis with medical therapy was discussed. Alternative approaches such as conventional surgical aortic valve replacement, transcatheter aortic valve replacement, and palliative medical therapy were compared and contrasted at length. This discussion was placed in  the context of the patient's own specific clinical presentation and past medical history. All of his questions have been addressed.   Following the decision to proceed with transcatheter aortic valve replacement, a discussion was held regarding what types of management strategies would be attempted intraoperatively in the event of life-threatening complications, including whether or not the patient would be considered a candidate for the use of cardiopulmonary bypass and/or conversion to open sternotomy for attempted surgical intervention.  I do not think he is a candidate for emergent sternotomy to manage any intraoperative complications due to his advanced age, oxygen dependent COPD, and restricted mobility.  The patient is aware of the fact that transient use  of cardiopulmonary bypass may be necessary. The patient has been advised of a variety of complications that might develop including but not limited to risks of death, stroke, paravalvular leak, aortic dissection or other major vascular complications, aortic annulus rupture, device embolization, cardiac rupture or perforation, mitral regurgitation, acute myocardial infarction, arrhythmia, heart block or bradycardia requiring permanent pacemaker placement, congestive heart failure, respiratory failure, renal failure, pneumonia, infection, other late complications related to structural valve deterioration or migration, or other complications that might ultimately cause a temporary or permanent loss of functional independence or other long term morbidity. The patient provides full informed consent for the procedure as described and all questions were answered.      Plan:  He will be scheduled for transfemoral TAVR using a SAPIEN 3 valve on 11/25/2021.  I spent 60 minutes performing this consultation and > 50% of this time was spent face to face counseling and coordinating the care of this patient's severe symptomatic aortic stenosis.   Gaye Pollack, MD 11/19/2021

## 2021-11-20 NOTE — Progress Notes (Signed)
Surgical Instructions    Your procedure is scheduled on Tuesday, 11/25/21.  Report to [facility] Main Entrance "A" at 8:45 A.M., then check in with the Admitting office.  Call this number if you have problems the morning of surgery:  [phone]   If you have any questions prior to your surgery date call [phone]: Open Monday-Friday 8am-4pm    Remember:  Do not eat or drink after midnight the night before your surgery    STOP now, 8/23, taking any Aleve, Naproxen, Ibuprofen, Motrin, Advil, Goody's, BC's, all herbal medications, fish oil, and all non-prescription vitamins.    Continue taking all other medications including Aspirin without change through the day before surgery. On the morning of surgery you can use inhalers but do not take any other medications.           Do not wear jewelry or makeup. Do not wear lotions, powders, perfumes/cologne or deodorant. Men may shave face and neck. Do not bring valuables to the hospital. Do not wear nail polish, gel polish, artificial nails, or any other type of covering on natural nails (fingers and toes) If you have artificial nails or gel coating that need to be removed by a nail salon, please have this removed prior to surgery. Artificial nails or gel coating may interfere with anesthesia's ability to adequately monitor your vital signs.  [facility] is not responsible for any belongings or valuables.    Do NOT Smoke (Tobacco/Vaping)  24 hours prior to your procedure  If you use a CPAP at night, you may bring your mask for your overnight stay.   Contacts, glasses, hearing aids, dentures or partials may not be worn into surgery, please bring cases for these belongings   For patients admitted to the hospital, discharge time will be determined by your treatment team.   Patients discharged the day of surgery will not be allowed to drive home, and someone needs to stay with them for 24 hours.   SURGICAL WAITING ROOM  VISITATION Patients having surgery or a procedure may have no more than 2 support people in the waiting area - these visitors may rotate.   Children under the age of 102 must have an adult with them who is not the patient. If the patient needs to stay at the hospital during part of their recovery, the visitor guidelines for inpatient rooms apply. Pre-op nurse will coordinate an appropriate time for 1 support person to accompany patient in pre-op.  This support person may not rotate.   Please refer to the [facility] website for the visitor guidelines for Inpatients (after your surgery is over and you are in a regular room).    Special instructions:    Oral Hygiene is also important to reduce your risk of infection.  Remember - BRUSH YOUR TEETH THE MORNING OF SURGERY WITH YOUR REGULAR TOOTHPASTE   [facility]- Preparing For Surgery  Before surgery, you can play an important role. Because skin is not sterile, your skin needs to be as free of germs as possible. You can reduce the number of germs on your skin by washing with CHG (chlorahexidine gluconate) Soap before surgery.  CHG is an antiseptic cleaner which kills germs and bonds with the skin to continue killing germs even after washing.     Please do not use if you have an allergy to CHG or antibacterial soaps. If your skin becomes reddened/irritated stop using the CHG.  Do not shave (including legs and underarms) for at least  48 hours prior to first CHG shower. It is OK to shave your face.  Please follow these instructions carefully.     Shower the NIGHT BEFORE SURGERY and the MORNING OF SURGERY with CHG Soap.   If you chose to wash your hair, wash your hair first as usual with your normal shampoo. After you shampoo, rinse your hair and body thoroughly to remove the shampoo.  Then [employer] and genitals (private parts) with your normal soap and rinse thoroughly to remove soap.  After that Use CHG Soap as you would any other liquid soap.  You can apply CHG directly to the skin and wash gently with a scrungie or a clean washcloth.   Apply the CHG Soap to your body ONLY FROM THE NECK DOWN.  Do not use on open wounds or open sores. Avoid contact with your eyes, ears, mouth and genitals (private parts). Wash Face and genitals (private parts)  with your normal soap.   Wash thoroughly, paying special attention to the area where your surgery will be performed.  Thoroughly rinse your body with warm water from the neck down.  DO NOT shower/wash with your normal soap after using and rinsing off the CHG Soap.  Pat yourself dry with a CLEAN TOWEL.  Wear CLEAN PAJAMAS to bed the night before surgery  Place CLEAN SHEETS on your bed the night before your surgery  DO NOT SLEEP WITH PETS.   Day of Surgery: Take a shower with CHG soap. Wear Clean/Comfortable clothing the morning of surgery Do not apply any deodorants/lotions.   Remember to brush your teeth WITH YOUR REGULAR TOOTHPASTE.    If you received a COVID test during your pre-op visit, it is requested that you wear a mask when out in public, stay away from anyone that may not be feeling well, and notify your surgeon if you develop symptoms. If you have been in contact with anyone that has tested positive in the last 10 days, please notify your surgeon.    Please read over the following fact sheets that you were given.

## 2021-11-21 ENCOUNTER — Ambulatory Visit (HOSPITAL_COMMUNITY)
Admission: RE | Admit: 2021-11-21 | Discharge: 2021-11-21 | Disposition: A | Discharge status: Home / Self Care | Payer: Non-veteran care | Source: Ambulatory Visit | Attending: Cardiovascular Disease | Admitting: Cardiovascular Disease

## 2021-11-21 ENCOUNTER — Encounter (HOSPITAL_COMMUNITY)
Admission: RE | Admit: 2021-11-21 | Discharge: 2021-11-21 | Disposition: A | Discharge status: Home / Self Care | Payer: Medicare Other | Source: Ambulatory Visit | Attending: Cardiovascular Disease | Admitting: Cardiovascular Disease

## 2021-11-21 ENCOUNTER — Encounter (HOSPITAL_COMMUNITY): Payer: Self-pay

## 2021-11-21 ENCOUNTER — Other Ambulatory Visit: Payer: Self-pay

## 2021-11-21 VITALS — BP 152/84 | HR 69 | Temp 98.1°F | Resp 17 | Ht 67.0 in | Wt 175.7 lb

## 2021-11-21 DIAGNOSIS — J449 Chronic obstructive pulmonary disease, unspecified: Secondary | ICD-10-CM | POA: Insufficient documentation

## 2021-11-21 DIAGNOSIS — I350 Nonrheumatic aortic (valve) stenosis: Secondary | ICD-10-CM

## 2021-11-21 DIAGNOSIS — I38 Endocarditis, valve unspecified: Secondary | ICD-10-CM

## 2021-11-21 DIAGNOSIS — Z20822 Contact with and (suspected) exposure to covid-19: Secondary | ICD-10-CM | POA: Diagnosis not present

## 2021-11-21 DIAGNOSIS — Z01818 Encounter for other preprocedural examination: Secondary | ICD-10-CM

## 2021-11-21 HISTORY — DX: Acute myocardial infarction, unspecified: I21.9

## 2021-11-21 LAB — COMPREHENSIVE METABOLIC PANEL
ALT: 11 U/L (ref 0–44)
AST: 16 U/L (ref 15–41)
Albumin: 3.8 g/dL (ref 3.5–5.0)
Alkaline Phosphatase: 74 U/L (ref 38–126)
Anion gap: 7 (ref 5–15)
BUN: 15 mg/dL (ref 8–23)
CO2: 28 mmol/L (ref 22–32)
Calcium: 8.9 mg/dL (ref 8.9–10.3)
Chloride: 99 mmol/L (ref 98–111)
Creatinine, Ser: 0.81 mg/dL (ref 0.61–1.24)
GFR, Estimated: >60 mL/min (ref >60)
Glucose, Bld: 96 mg/dL (ref 70–99)
Potassium: 3.9 mmol/L (ref 3.5–5.1)
Sodium: 134 mmol/L — ABNORMAL LOW (ref 135–145)
Total Bilirubin: 1.3 mg/dL — ABNORMAL HIGH (ref 0.3–1.2)
Total Protein: 6.8 g/dL (ref 6.5–8.1)

## 2021-11-21 LAB — CBC
HCT: 40.4 % (ref 39.0–52.0)
Hemoglobin: 13.3 g/dL (ref 13.0–17.0)
MCH: 30 pg (ref 26.0–34.0)
MCHC: 32.9 g/dL (ref 30.0–36.0)
MCV: 91.2 fL (ref 80.0–100.0)
Platelets: 260 10*3/uL (ref 150–400)
RBC: 4.43 MIL/uL (ref 4.22–5.81)
RDW: 13.3 % (ref 11.5–15.5)
WBC: 9.8 10*3/uL (ref 4.0–10.5)
nRBC: 0 % (ref 0.0–0.2)

## 2021-11-21 LAB — PROTIME-INR
INR: 1.1 (ref 0.8–1.2)
Prothrombin Time: 13.9 seconds (ref 11.4–15.2)

## 2021-11-21 LAB — TYPE AND SCREEN
ABO/RH(D): A POS
Antibody Screen: NEGATIVE

## 2021-11-21 LAB — URINALYSIS, ROUTINE W REFLEX MICROSCOPIC
Bacteria, UA: NONE SEEN
Bilirubin Urine: NEGATIVE
Glucose, UA: >=500 mg/dL — AB
Hgb urine dipstick: NEGATIVE
Ketones, ur: NEGATIVE mg/dL
Leukocytes,Ua: NEGATIVE
Nitrite: NEGATIVE
Protein, ur: NEGATIVE mg/dL
Specific Gravity, Urine: 1.013 (ref 1.005–1.030)
pH: 7 (ref 5.0–8.0)

## 2021-11-21 LAB — SURGICAL PCR SCREEN
MRSA, PCR: NEGATIVE
Staphylococcus aureus: NEGATIVE

## 2021-11-21 NOTE — Progress Notes (Signed)
Notified Curtis Campos of abnormal lab results.

## 2021-11-21 NOTE — Progress Notes (Signed)
PCP - Emily Filbert Cardiologist - Kathlyn Sacramento  PPM/ICD - denies  Chest x-ray - pending EKG - pending Stress Test - 08/31/19 ECHO - 05/21/21 Cardiac Cath - 08/27/21  Sleep Study - denies  No diabetes.  STOP now, 8/23, taking any Aleve, Naproxen, Ibuprofen, Motrin, Advil, Goody's, BC's, all herbal medications, fish oil, and all non-prescription vitamins.    Continue taking all other medications including Aspirin without change through the day before surgery. On the morning of surgery you can use inhalers but do not take any other medications.  ERAS Protcol - No   COVID TEST- no   Anesthesia review: yes- cardiac history  Patient denies shortness of breath, fever, cough and chest pain at PAT appointment   All instructions explained to the patient, with a verbal understanding of the material. Patient agrees to go over the instructions while at home for a better understanding. Patient also instructed to self quarantine after being tested for COVID-19. The opportunity to ask questions was provided.

## 2021-11-24 ENCOUNTER — Ambulatory Visit: Payer: Medicare Other | Admitting: Cardiovascular Disease

## 2021-11-24 MED ORDER — MAGNESIUM SULFATE 50 % IJ SOLN
40.0000 meq | INTRAMUSCULAR | Status: DC
  Filled 2021-11-24: qty 9.85

## 2021-11-24 MED ORDER — HEPARIN 30,000 UNITS/1000 ML (OHS) CELLSAVER SOLUTION
Status: DC
  Filled 2021-11-24: qty 1000

## 2021-11-24 MED ORDER — POTASSIUM CHLORIDE 2 MEQ/ML IV SOLN
80.0000 meq | INTRAVENOUS | Status: DC
  Filled 2021-11-24: qty 40

## 2021-11-24 MED ORDER — NOREPINEPHRINE 4 MG/250ML-% IV SOLN
0.0000 ug/min | INTRAVENOUS | Status: AC
  Administered 2021-11-25: 2 ug/min via INTRAVENOUS
  Filled 2021-11-24: qty 250

## 2021-11-24 MED ORDER — CEFAZOLIN SODIUM-DEXTROSE 2-4 GM/100ML-% IV SOLN
2.0000 g | INTRAVENOUS | Status: AC
  Administered 2021-11-25: 2 g via INTRAVENOUS
  Filled 2021-11-24: qty 100

## 2021-11-24 MED ORDER — DEXMEDETOMIDINE HCL IN NACL 400 MCG/100ML IV SOLN
0.1000 ug/kg/h | INTRAVENOUS | Status: AC
  Administered 2021-11-25: .5 ug/kg/h via INTRAVENOUS
  Administered 2021-11-25: 39.84 ug via INTRAVENOUS
  Filled 2021-11-24: qty 100

## 2021-11-24 NOTE — H&P (Signed)
[address]       [employer],[place] [address]             [phone]      Cardiothoracic Surgery Admission History and Physical  PCP is Rusty Aus, MD Referring Provider is Sherren Mocha, MD Primary Cardiologist is Kathlyn Sacramento, MD   Reason for admission:  Severe aortic stenosis   HPI:   The patient is an 81 year old gentleman with a history of hypertension, hyperlipidemia, ischemic cardiomyopathy,HFrEF, coronary artery disease, previous smoking with COPD on home oxygen, degenerative arthritis with reduced functional capacity, moderate mitral regurgitation, and severe aortic stenosis who was referred for consideration of TAVR.  He has a history of moderate aortic stenosis dating back to 2017 when his ejection fraction was 30 to 35%.  His ejection fraction was improved in 2022 to 55% but is now declined again to 40 to 45%.  2D echocardiogram in February 2023 showed a severely calcified aortic valve with restricted leaflet mobility.  The mean gradient was only 22 mmHg with a peak gradient of 36 mmHg but the dimensionless index was 0.19 with a valve area of 0.8 cm and a stroke-volume index of 39 suggesting low flow/low gradient severe aortic stenosis.  He was initially reluctant to proceed with further work-up for TAVR but has developed progressive exertional fatigue and shortness of breath as well as orthopnea.  His symptoms have improved on Lasix.           Past Medical History:  Diagnosis Date   Agent orange exposure     Arthritis 03/12/2011   CHF (congestive heart failure) (HCC)     Chronic obstructive lung disease ([place])     Coronary artery disease 03/2013    a. 03/2013 Cath: LM nl, LAD 61m, D1 50, D2 30, LCX 1m/d (chronic), OM1 50, RCA 40p, 59m, RPL 40, RPDA 60, EF 25%, mod-sev MR-->Med mgmt; b. 08/2019 MV: basal inflat/mid inflat defect consistent w/ ischemia in LCX distribution. EF 37%. Intermediate risk.   HFrEF (heart failure with reduced ejection  fraction) ([place])      a. 03/2013 TEE: EF 25%; b. 08/2017 Echo: EF 45-50%, Gr1 DD. Mod AS. Mod MR. Mildly dil LA; c. 06/2019 Echo: EF 30-35%, glob HK, gr1 DD; d. 06/2020 Echo: EF 55-60%, nl RV fxn, triv MR, Mod AS.   Hyperlipidemia     Hypertension     Ischemic cardiomyopathy      a. 03/2013 TEE: EF 25%; b. 08/2017 Echo: EF 45-50%; c. 06/2019 Echo: EF 30-35%; d. 06/2020 Echo: EF 55-60%.   Moderate aortic stenosis      a. 08/2017 Echo: Mod AS. Mean grad (S) 59mmHg. Valve area (VTI): 1.25cm^2; b. 06/2019 Echo: Mod AS. Mean grad 67mmHg, Valve area (VTI): 1.16cm^2); c. 06/2020 Mod AS. AVA 1.25cm^2, mean grad 42mmHg.   Moderate mitral regurgitation      a. 08/2017 Echo: mod MR; b. 06/2019 Echo: mild to mod MR.   Rectal cancer [facility])             Past Surgical History:  Procedure Laterality Date   APPENDECTOMY   06/2003   CARDIAC CATHETERIZATION   03/2013    [facility]   cataract surgery       COLOSTOMY       HERNIA REPAIR       resection of rectum       RIGHT/LEFT HEART CATH AND CORONARY ANGIOGRAPHY N/A 08/27/2021    Procedure: RIGHT/LEFT HEART CATH AND  CORONARY ANGIOGRAPHY;  Surgeon: Sherren Mocha, MD;  Location: [place] [facility];  Service: Cardiovascular;  Laterality: N/A;           Family History  Problem Relation Age of Onset   Heart disease Mother     Heart disease Father     Hyperlipidemia Father     Hypertension Father     Heart attack Brother 64        MI   Heart disease Sister          stents placed    Cancer - Cervical Sister     Heart disease Sister          CABG      Social History         Socioeconomic History   Marital status: Married      Spouse name: Not on file   Number of children: Not on file   Years of education: Not on file   Highest education level: Not on file  Occupational History   Not on file  Tobacco Use   Smoking status: Former      Packs/day: 1.00      Years: 50.00      Total pack years: 50.00      Types: Cigarettes      Quit date: 03/25/2013      Years  since quitting: 8.6   Smokeless tobacco: Never  Vaping Use   Vaping Use: Never used  Substance and Sexual Activity   Alcohol use: No      Alcohol/week: 0.0 standard drinks of alcohol   Drug use: No   Sexual activity: Yes  Other Topics Concern   Not on file  Social History Narrative    Arm '66-'68, [place], agent orange exposure.     Social Determinants of Health    Financial Resource Strain: Not on file  Food Insecurity: Not on file  Transportation Needs: Not on file  Physical Activity: Not on file  Stress: Not on file  Social Connections: Not on file  Intimate Partner Violence: Not on file             Prior to Admission medications   Medication Sig Start Date End Date Taking? Authorizing Provider  albuterol (VENTOLIN HFA) 108 (90 Base) MCG/ACT inhaler INHALE 2 PUFFS BY MOUTH EVERY 6 HOURS AS NEEDED FOR SHORTNESS OF BREATH 08/29/18   Yes [name], NP  Alirocumab (PRALUENT) 75 MG/ML SOAJ Inject 1 pen into the skin every 14 (fourteen) days. Patient taking differently: Inject 75 mg into the skin every 14 (fourteen) days. 08/25/18   Yes Wellington Hampshire, MD  aspirin 81 MG tablet Take 81 mg by mouth at bedtime.     Yes [provider]  carvedilol (COREG) 6.25 MG tablet Take 1 tablet (6.25 mg total) by mouth 2 (two) times daily. Patient taking differently: Take 3.125 mg by mouth 2 (two) times daily. 06/17/21   Yes Furth, Cadence H, PA-C  cholecalciferol (VITAMIN D3) 25 MCG (1000 UNIT) tablet Take 1,000 Units by mouth daily.     Yes [provider]  dapagliflozin propanediol (FARXIGA) 10 MG TABS tablet Take 1 tablet (10 mg total) by mouth daily before breakfast. 10/10/21   Yes Furth, Cadence H, PA-C  ezetimibe (ZETIA) 10 MG tablet Take 1 tablet by mouth once daily for cholesterol. NEED APPOINTMENT FOR ANY MORE REFILLS 08/29/18   Yes [name], NP  fluticasone-salmeterol (ADVAIR) 250-50 MCG/ACT AEPB 1 puff daily. 03/05/21  Yes [provider]   furosemide (LASIX) 20 MG tablet TAKE 1 TABLET BY MOUTH ONCE DAILY. Patient taking differently: Take 20 mg by mouth at bedtime. 02/13/21   Yes Wellington Hampshire, MD  guaiFENesin (ROBITUSSIN) 100 MG/5ML liquid Take 10 mLs by mouth every 4 (four) hours as needed for cough or to loosen phlegm. 03/15/21   Yes Barb Merino, MD  Magnesium Oxide 400 (240 Mg) MG TABS TAKE 1 TABLET BY MOUTH ONCE DAILY. 05/03/17   Yes Wellington Hampshire, MD  sacubitril-valsartan (ENTRESTO) 49-51 MG Take 0.5 tablets by mouth 2 (two) times daily.     Yes [provider]  traZODone (DESYREL) 50 MG tablet Take 1 tablet (50 mg total) by mouth at bedtime as needed. for sleep 08/29/18   Yes [name], NP  TRELEGY ELLIPTA 100-62.5-25 MCG/ACT AEPB Inhale 1 puff into the lungs daily. 04/16/21   Yes [provider]            Current Outpatient Medications  Medication Sig Dispense Refill   albuterol (VENTOLIN HFA) 108 (90 Base) MCG/ACT inhaler INHALE 2 PUFFS BY MOUTH EVERY 6 HOURS AS NEEDED FOR SHORTNESS OF BREATH 18 g 0   Alirocumab (PRALUENT) 75 MG/ML SOAJ Inject 1 pen into the skin every 14 (fourteen) days. (Patient taking differently: Inject 75 mg into the skin every 14 (fourteen) days.) 2 pen 11   aspirin 81 MG tablet Take 81 mg by mouth at bedtime.       carvedilol (COREG) 6.25 MG tablet Take 1 tablet (6.25 mg total) by mouth 2 (two) times daily. (Patient taking differently: Take 3.125 mg by mouth 2 (two) times daily.) 180 tablet 3   cholecalciferol (VITAMIN D3) 25 MCG (1000 UNIT) tablet Take 1,000 Units by mouth daily.       dapagliflozin propanediol (FARXIGA) 10 MG TABS tablet Take 1 tablet (10 mg total) by mouth daily before breakfast. 90 tablet 1   ezetimibe (ZETIA) 10 MG tablet Take 1 tablet by mouth once daily for cholesterol. NEED APPOINTMENT FOR ANY MORE REFILLS 90 tablet 1   fluticasone-salmeterol (ADVAIR) 250-50 MCG/ACT AEPB 1 puff daily.       furosemide (LASIX) 20 MG tablet TAKE 1 TABLET BY  MOUTH ONCE DAILY. (Patient taking differently: Take 20 mg by mouth at bedtime.) 30 tablet 11   guaiFENesin (ROBITUSSIN) 100 MG/5ML liquid Take 10 mLs by mouth every 4 (four) hours as needed for cough or to loosen phlegm. 120 mL 0   Magnesium Oxide 400 (240 Mg) MG TABS TAKE 1 TABLET BY MOUTH ONCE DAILY. 90 tablet 3   sacubitril-valsartan (ENTRESTO) 49-51 MG Take 0.5 tablets by mouth 2 (two) times daily.       traZODone (DESYREL) 50 MG tablet Take 1 tablet (50 mg total) by mouth at bedtime as needed. for sleep 90 tablet 0   TRELEGY ELLIPTA 100-62.5-25 MCG/ACT AEPB Inhale 1 puff into the lungs daily.        No current facility-administered medications for this visit.           Allergies  Allergen Reactions   Codeine Nausea Only   Statins Other (See Comments)      REACTION: maylgias          Review of Systems:               General:                      normal appetite, + decreased  energy, + weight gain, no weight loss, no fever             Cardiac:                       no chest pain with exertion, no chest pain at rest, +SOB with mild exertion, + resting SOB, no PND, + orthopnea, no palpitations, no arrhythmia, no atrial fibrillation, no LE edema, no dizzy spells, no syncope             Respiratory:                 +  shortness of breath, + home oxygen, + productive cough, no dry cough, + bronchitis, no wheezing, no hemoptysis, no asthma, no pain with inspiration or cough, no sleep apnea, no CPAP at night             GI:                               no difficulty swallowing, no reflux, no frequent heartburn, no hiatal hernia, no abdominal pain, no constipation, no diarrhea, no hematochezia, no hematemesis, no melena             GU:                              no dysuria,  no frequency, no urinary tract infection, no hematuria, no enlarged prostate, no kidney stones, no kidney disease             Vascular:                     no pain suggestive of claudication, no pain in feet, no leg  cramps, no varicose veins, no DVT, no non-healing foot ulcer             Neuro:                         no stroke, no TIA's, no seizures, no headaches, no temporary blindness one eye,  no slurred speech, no peripheral neuropathy, no chronic pain, no instability of gait, no memory/cognitive dysfunction             Musculoskeletal:         + arthritis, + joint swelling, +myalgias, + difficulty walking, + reduced mobility              Skin:                            no rash, no itching, no skin infections, no pressure sores or ulcerations             Psych:                         no anxiety, no depression, no nervousness, no unusual recent stress             Eyes:                           no blurry vision, + floaters, no recent vision changes, + wears glasses            ENT:  no hearing loss, no loose or painful teeth, working on getting dentures             Hematologic:               + easy bruising, no abnormal bleeding, no clotting disorder, no frequent epistaxis             Endocrine:                   no diabetes, does not check CBG's at home                            Physical Exam:               Ht 5\' 6"  (1.676 m)   BMI 28.25 kg/m              General:                      Elderly,  well-appearing             HEENT:                       Unremarkable, NCAT, PERLA, EOMI,              Neck:                           no JVD, no bruits, no adenopathy              Chest:                          clear to auscultation, symmetrical breath sounds, no wheezes, no rhonchi              CV:                              RRR, 3/6 systolic murmur RSB, no diastolic murmur             Abdomen:                    soft, non-tender, no masses              Extremities:                 warm, well-perfused, pulses palpable at ankles, no lower extremity edema             Rectal/GU                   Deferred             Neuro:                         Grossly non-focal and symmetrical  throughout             Skin:                            Clean and dry, no rashes, no breakdown   Diagnostic Tests:     ECHOCARDIOGRAM REPORT         Patient Name:   RAYMONDO GARCIALOPEZ Date of Exam: 05/21/2021  Medical Rec #:  [id number]  Height:       66.0 in  Accession #:    [id number]       Weight:       166.0 lb  Date of Birth:  1941/01/11        BSA:          1.848 m  Patient Age:    6 years         BP:           100/66 mmHg  Patient Gender: M                HR:           70 bpm.  Exam Location:     Procedure: 2D Echo, Cardiac Doppler and Color Doppler   Indications:    I35.0 Nonrheumatic aortic (valve) stenosis     History:        Patient has prior history of Echocardiogram examinations,  most                  recent 07/23/2020. CHF and Cardiomyopathy, CAD, COPD,  Aortic                  Valve Disease and Mitral Valve Disease,  Signs/Symptoms:Shortness                  of Breath; Risk Factors:Former Smoker, Hypertension and                  Dyslipidemia.     Sonographer:    Pilar Jarvis RDMS, RVT, RDCS  Sonographer#2:  Wilkie Aye RVT  Referring Phys: 22 [facility] A ARIDA      Sonographer Comments: Technically challenging study due to limited  acoustic windows. Patient was suffering from a rib injury and had a great  deal of trouble tolerating transducer pressure at the apical windows  IMPRESSIONS     1. Left ventricular ejection fraction, by estimation, is 40 to 45%. Left  ventricular ejection fraction by 2D MOD biplane is 42.8 %. The left  ventricle has mild to moderately decreased function. The left ventricle  has no regional wall motion  abnormalities. Left ventricular diastolic parameters are consistent with  Grade I diastolic dysfunction (impaired relaxation).   2. Right ventricular systolic function is normal. The right ventricular  size is normal.   3. Left atrial size was moderately dilated.   4. The mitral valve is degenerative. Mild to  moderate mitral valve  regurgitation. Moderate mitral annular calcification.   5. Aortic valve mean gradient 51mmHg, peak gradient 17mmHg, AVA 0.8cm2,  DVI 0.19. suggesting low flow-low gradient severe aortic stenosis.  consider low dose dobutamine study for further eval.. The aortic valve is  calcified. Aortic valve regurgitation is   not visualized. Severe aortic valve stenosis.   6. The inferior vena cava is normal in size with greater than 50%  respiratory variability, suggesting right atrial pressure of 3 mmHg.   Comparison(s): Previous AS pressure gradients were, 43mmHg max, 66mmHg  mean.   FINDINGS   Left Ventricle: Left ventricular ejection fraction, by estimation, is 40  to 45%. Left ventricular ejection fraction by 2D MOD biplane is 42.8 %.  The left ventricle has mild to moderately decreased function. The left  ventricle has no regional wall motion   abnormalities. The left ventricular internal cavity size was normal in  size. There is no left ventricular hypertrophy. Left ventricular diastolic  parameters are consistent with Grade I diastolic dysfunction (impaired  relaxation).   Right Ventricle: The right ventricular size is normal. No increase in  right ventricular wall thickness. Right ventricular systolic function is  normal.   Left Atrium: Left atrial size was moderately dilated.   Right Atrium: Right atrial size was normal in size.   Pericardium: There is no evidence of pericardial effusion.   Mitral Valve: The mitral valve is degenerative in appearance. Moderate  mitral annular calcification. Mild to moderate mitral valve regurgitation.   Tricuspid Valve: The tricuspid valve is normal in structure. Tricuspid  valve regurgitation is mild.   Aortic Valve: Aortic valve mean gradient 3mmHg, peak gradient 81mmHg, AVA  0.8cm2, DVI 0.19. suggesting low flow-low gradient severe aortic stenosis.  consider low dose dobutamine study for further eval. The aortic valve  is  calcified. Aortic valve  regurgitation is not visualized. Severe aortic stenosis is present. Aortic  valve mean gradient measures 22.0 mmHg. Aortic valve peak gradient  measures 39.1 mmHg. Aortic valve area, by VTI measures 0.79 cm.   Pulmonic Valve: The pulmonic valve was not well visualized. Pulmonic valve  regurgitation is trivial.   Aorta: The aortic root is normal in size and structure.   Venous: The inferior vena cava is normal in size with greater than 50%  respiratory variability, suggesting right atrial pressure of 3 mmHg.   IAS/Shunts: No atrial level shunt detected by color flow Doppler.      LEFT VENTRICLE  PLAX 2D                        Biplane EF (MOD)  LVIDd:         5.60 cm         LV Biplane EF:   Left  LVIDs:         4.30 cm                          ventricular  LV PW:         0.80 cm                          ejection  LV IVS:        0.90 cm                          fraction by  LVOT diam:     2.30 cm                          2D MOD  LV SV:         62                               biplane is  LV SV Index:   33                               42.8 %.  LVOT Area:     4.15 cm                                 Diastology  LV e' medial:    3.92 cm/s  LV Volumes (MOD)               LV E/e' medial:  20.4  LV vol d, MOD    131.0 ml      LV e' lateral:   7.83 cm/s  A2C:                           LV E/e' lateral: 10.2  LV vol d, MOD    107.0 ml  A4C:  LV vol s, MOD    68.5 ml  A2C:  LV vol s, MOD    65.6 ml  A4C:  LV SV MOD A2C:   62.5 ml  LV SV MOD A4C:   107.0 ml  LV SV MOD BP:    51.0 ml   RIGHT VENTRICLE             IVC  RV Basal diam:  3.40 cm     IVC diam: 1.80 cm  RV S prime:     11.30 cm/s  TAPSE (M-mode): 2.4 cm   LEFT ATRIUM            Index        RIGHT ATRIUM           Index  LA diam:      4.40 cm  2.38 cm/m   RA Area:     19.90 cm  LA Vol (A2C): 115.0 ml 62.24 ml/m  RA Volume:   60.00 ml  32.48 ml/m  LA Vol  (A4C): 45.4 ml  24.57 ml/m   AORTIC VALVE                     PULMONIC VALVE  AV Area (Vmax):    0.94 cm      PV Vmax:          0.93 m/s  AV Area (Vmean):   0.87 cm      PV Peak grad:     3.5 mmHg  AV Area (VTI):     0.79 cm      PR End Diast Vel: 1.58 msec  AV Vmax:           312.50 cm/s  AV Vmean:          211.000 cm/s  AV VTI:            0.777 m  AV Peak Grad:      39.1 mmHg  AV Mean Grad:      22.0 mmHg  LVOT Vmax:         70.57 cm/s  LVOT Vmean:        44.367 cm/s  LVOT VTI:          0.148 m  LVOT/AV VTI ratio: 0.19     AORTA  Ao Root diam: 2.90 cm  Ao Asc diam:  3.40 cm  Ao Arch diam: 2.8 cm   MITRAL VALVE                TRICUSPID VALVE  MV Area (PHT): 2.10 cm     TR Peak grad:   36.2 mmHg  MV Decel Time: 362 msec     TR Vmax:        301.00 cm/s  MV E velocity: 79.80 cm/s  MV A velocity: 108.00 cm/s  SHUNTS  MV E/A ratio:  0.74  Systemic VTI:  0.15 m                              Systemic Diam: 2.30 cm   Kate Sable MD  Electronically signed by Kate Sable MD  Signature Date/Time: 05/21/2021/5:39:47 PM         Final       Physicians   Panel Physicians Referring Physician Case Authorizing Physician  Sherren Mocha, MD (Primary)        Procedures   RIGHT/LEFT HEART CATH AND CORONARY ANGIOGRAPHY    Conclusion   1.  Severe single-vessel coronary artery disease with severe stenosis of the mid circumflex unchanged from the previous cath study in 2015 2.  Mild nonobstructive LAD plaquing and mild ostial diagonal stenoses 3.  Mild nonobstructive mid RCA stenosis and severe distal PDA stenosis unchanged from previous cath study 4.  Severe low-flow low gradient aortic stenosis with mean transvalvular gradient of 26 mmHg, aortic valve is calcified and restricted on plain fluoroscopy   Plan: Continue TAVR evaluation, medical therapy for CAD   Indications   Severe aortic stenosis [I35.0 (ICD-10-CM)]    Procedural Details   Technical Details  INDICATION: 81 year old male with O2 dependent COPD and severe low-flow low gradient aortic stenosis presents today for cardiac catheterization as part of his preop evaluation for aortic stenosis.  PROCEDURAL DETAILS: There was an indwelling IV in a right antecubital vein. Using normal sterile technique, the IV was changed out for a 5 Fr brachial sheath over a 0.018 inch wire. The right wrist was then prepped, draped, and anesthetized with 1% lidocaine. Using the modified Seldinger technique a 5/6 French Slender sheath was placed in the right radial artery. Intra-arterial verapamil was administered through the radial artery sheath. IV heparin was administered after a JR4 catheter was advanced into the central aorta. A Swan-Ganz catheter was used for the right heart catheterization. Standard protocol was followed for recording of right heart pressures and sampling of oxygen saturations. Fick cardiac output was calculated. Standard Judkins catheters were used for selective coronary angiography. LV pressure is recorded and an aortic valve pullback is performed. There were no immediate procedural complications. The patient was transferred to the post catheterization recovery area for further monitoring.      Estimated blood loss <50 mL.   During this procedure medications were administered to achieve and maintain moderate conscious sedation while the patient's heart rate, blood pressure, and oxygen saturation were continuously monitored and I was present face-to-face 100% of this time.    Medications (Filter: Administrations occurring from 1423 to 1527 on 08/27/21)  important  Continuous medications are totaled by the amount administered until 08/27/21 1527.    midazolam (VERSED) injection (mg) Total dose:  1 mg  Date/Time Rate/Dose/Volume Action    08/27/21 1443 1 mg Given      fentaNYL (SUBLIMAZE) injection (mcg) Total dose:  25 mcg  Date/Time Rate/Dose/Volume Action    08/27/21 1444 25 mcg  Given      lidocaine (PF) (XYLOCAINE) 1 % injection (mL) Total volume:  4 mL  Date/Time Rate/Dose/Volume Action    08/27/21 1454 2 mL Given    1457 2 mL Given      Radial Cocktail/Verapamil only (mL) Total volume:  10 mL  Date/Time Rate/Dose/Volume Action    08/27/21 1457 10 mL Given      heparin sodium (porcine) injection (Units) Total dose:  4,000 Units  Date/Time Rate/Dose/Volume Action  08/27/21 1502 4,000 Units Given      iohexol (OMNIPAQUE) 350 MG/ML injection (mL) Total volume:  45 mL  Date/Time Rate/Dose/Volume Action    08/27/21 1516 45 mL Given      Heparin (Porcine) in NaCl 1000-0.9 UT/500ML-% SOLN (mL) Total volume:  1,000 mL  Date/Time Rate/Dose/Volume Action    08/27/21 1516 500 mL Given    1516 500 mL Given      Sedation Time   Sedation Time Physician-1: 30 minutes 59 seconds Contrast   Medication Name Total Dose  iohexol (OMNIPAQUE) 350 MG/ML injection 45 mL    Radiation/Fluoro   Fluoro time: 5.8 (min) DAP: 11162 (mGycm2) Cumulative Air Kerma: 160 (mGy) Complications      Complications documented before study signed (08/27/2021  7:37 PM)     No complications were associated with this study.  Documented by Elveria Royals, RT - 08/27/2021  3:19 PM      Coronary Findings   Diagnostic Dominance: Right Left Main  The vessel exhibits minimal luminal irregularities.    Left Anterior Descending  There is mild diffuse disease throughout the vessel.    First Diagonal Branch  1st Diag lesion is 50% stenosed.    Second Diagonal Branch  2nd Diag lesion is 50% stenosed.    Left Circumflex  Mid Cx lesion is 90% stenosed. Severe stenosis unchanged from the previous study in 2015    Right Coronary Artery  Prox RCA to Mid RCA lesion is 40% stenosed. The lesion is moderately calcified.    Right Posterior Descending Artery  RPDA lesion is 80% stenosed.    Intervention    No interventions have been documented.    Coronary Diagrams    Diagnostic Dominance: Right  Intervention   Implants      No implant documentation for this case.    [employer]ng[employer] Images    Show images for CARDIAC CATHETERIZATION Images on Long Term Storage    Show images for G[place], [place] to Procedure Log   Procedure Log    Hemo Data   Flowsheet Row Most Recent Value  Fick Cardiac Output 6.03 L/min  Fick Cardiac Output Index 3.2 (L/min)/BSA  Aortic Mean Gradient 26.18 mmHg  Aortic Peak Gradient 20.9 mmHg  Aortic Valve Area 1.31  Aortic Value Area Index 0.69 cm2/BSA  RA A Wave 8 mmHg  RA V Wave 5 mmHg  RA Mean 3 mmHg  RV Systolic Pressure 31 mmHg  RV Diastolic Pressure 1 mmHg  RV EDP 7 mmHg  PA Systolic Pressure 31 mmHg  PA Diastolic Pressure 11 mmHg  PA Mean 22 mmHg  PW A Wave 9 mmHg  PW V Wave 6 mmHg  PW Mean 4 mmHg  AO Systolic Pressure 106 mmHg  AO Diastolic Pressure 66 mmHg  AO Mean 93 mmHg  LV Systolic Pressure 269 mmHg  LV Diastolic Pressure -2 mmHg  LV EDP 9 mmHg  AOp Systolic Pressure 485 mmHg  AOp Diastolic Pressure 63 mmHg  AOp Mean Pressure 93 mmHg  LVp Systolic Pressure 462 mmHg  LVp Diastolic Pressure -3 mmHg  LVp EDP Pressure 12 mmHg  QP/QS 1  TPVR Index 6.88 HRUI  TSVR Index 29.09 HRUI  PVR SVR Ratio 0.2  TPVR/TSVR Ratio 0.24      ADDENDUM REPORT: 10/15/2021 11:30   CLINICAL DATA:  Aortic stenosis   EXAM: Cardiac TAVR CT   TECHNIQUE: The patient was scanned on a Siemens Force 703 slice scanner. A 120 kV retrospective scan was triggered  in the descending thoracic aorta at 111 HU's. Gantry rotation speed was 270 msecs and collimation was .9 mm. No beta blockade or nitro were given. The 3D data set was reconstructed in 5% intervals of the R-R cycle. Systolic and diastolic phases were analyzed on a dedicated work station using MPR, MIP and VRT modes. The patient received 80 cc of contrast.   FINDINGS: Aortic Valve: Calcified tri leaflet valve with score 2208   Aorta: No aneurysm normal  arch vessels mild calcific atherosclerosis   Sinotubular Junction: 29.1 mm   Ascending Thoracic Aorta: 35 mm   Aortic Arch: 29 mm   Descending Thoracic Aorta: 29 mm   Sinus of Valsalva Measurements:   Non-coronary: 32.1 mm   Right - coronary: 31 mm  Height 19.6 mm   Left - coronary: 33.2 mm  Height 19.2 mm   Coronary Artery Height above Annulus:   Left Main: 16.8 mm above annulus   Right Coronary: 14.7 mm above annulus   Virtual Basal Annulus Measurements:   Maximum/Minimum Diameter: 29.3 mm x 24.9 mm   Perimeter: 87.2 mm   Area: 555 mm2   Coronary Arteries: Sufficient height above annulus for deployment   Optimum Fluoroscopic Angle for Delivery: LAO 29 Caudal 9 degrees   IMPRESSION: 1. Calcified tri leaflet AV with score 2208   2. Optimum angiographic angle for deployment LAO 29 Caudal 9 degrees   3. Annular area 555 mm2 Lower range for 29 mm Sapien 3 valve Sinus length fine some concern for somewhat small calcified STJ   4.  Coronary height sufficient for deployment   5.  Membranous septal length 9 mm   Jenkins Rouge     Electronically Signed   By: Jenkins Rouge M.D.   On: 10/15/2021 11:30    Addended by Josue Hector, MD on 10/15/2021 11:32 AM    Study Result   Narrative & Impression  EXAM: OVER-READ INTERPRETATION  CT CHEST   The following report is a limited chest CT over-read performed by radiologist Dr. Yetta Glassman of [facility], [place] on 10/15/2021. This over-read does not include interpretation of cardiac or coronary anatomy or pathology. The cardiac CTA interpretation by the cardiologist is attached.   COMPARISON:  None Available.   FINDINGS: Extracardiac findings will be described separately under dictation for contemporaneously obtained CTA chest, abdomen and pelvis.   IMPRESSION: Please see separate dictation for contemporaneously obtained CTA chest, abdomen and pelvis dated 10/15/2021 for full description  of relevant extracardiac findings.   Electronically Signed: By: Yetta Glassman M.D. On: 10/15/2021 10:59      Narrative & Impression  CLINICAL DATA:  Aortic valve replacement preop evaluation   EXAM: CT ANGIOGRAPHY CHEST, ABDOMEN AND PELVIS   TECHNIQUE: Multidetector CT imaging through the chest, abdomen and pelvis was performed using the standard protocol during bolus administration of intravenous contrast. Multiplanar reconstructed images and MIPs were obtained and reviewed to evaluate the vascular anatomy.   RADIATION DOSE REDUCTION: This exam was performed according to the departmental dose-optimization program which includes automated exposure control, adjustment of the mA and/or kV according to patient size and/or use of iterative reconstruction technique.   CONTRAST:  163mL OMNIPAQUE IOHEXOL 350 MG/ML SOLN   COMPARISON:  None Available.   FINDINGS: CTA CHEST FINDINGS   Cardiovascular: Normal heart size. Pericardial effusion. Left main and three-vessel coronary artery calcifications. Normal caliber thoracic aorta with moderate atherosclerotic disease. Aortic valve thickening and calcifications.   Mediastinum/Nodes: Esophagus and thyroid are unremarkable.  Mildly enlarged subcarinal lymph node, measuring up to 13 mm in short axis, likely reactive.   Lungs/Pleura: Central airways are patent. Centrilobular emphysema. Biapical pleural-parenchymal scarring, unchanged when compared with prior exam. Linear opacity of the lingula, likely due to scarring or atelectasis. Solid nodule of the posterior left upper lobe measuring 8 mm on series 5 image 248, slightly increased in size when compared with March 10, 2021 prior exam, previously measured 6 mm.   Musculoskeletal: Multiple old left-sided rib fractures. No aggressive appearing osseous lesions.   CTA ABDOMEN AND PELVIS FINDINGS   Hepatobiliary: No focal liver abnormality. Gallstones with no gallbladder wall  thickening. No biliary ductal dilation.   Pancreas: Unremarkable. No pancreatic ductal dilatation or surrounding inflammatory changes.   Spleen: Normal in size without focal abnormality.   Adrenals/Urinary Tract: Bilateral adrenal glands are unremarkable. Kidneys enhance symmetrically with no hydronephrosis. Bilateral nonobstructing renal stones. Bladder is unremarkable.   Stomach/Bowel: Normal appearance of the stomach. Partial colectomy with left lower quadrant colostomy. No bowel wall thickening inflammatory change or evidence of obstruction.   Vascular/lymphatic: Severe atherosclerotic disease of the abdominal aorta. Moderate narrowing at the origin of the celiac artery and left renal watery, otherwise no significant stenosis of the branch vessels. Mildly dilated infrarenal abdominal aorta which not meet criteria for aneurysm. No pathologically enlarged lymph nodes seen in the abdomen or pelvis.   Reproductive: TURP defect of the prostate.   Other: Small left-greater-than-right fat containing inguinal hernias. Small fat containing hernia of the ventral abdominal wall which is slightly right of midline.   Musculoskeletal: Mild age indeterminate compression deformity of L3.   VASCULAR MEASUREMENTS PERTINENT TO TAVR:   AORTA:   Minimal Aortic Diameter-12.4 mm   Severity of Aortic Calcification-severe   RIGHT PELVIS:   Right Common Iliac Artery -   Minimal Diameter-8.4 mm   Tortuosity-none   Calcification-severe   Right External Iliac Artery -   Minimal Diameter-0.8 mm   Tortuosity-mild   Calcification-severe   Right Common Femoral Artery -   Minimal Diameter-6.2 mm   Tortuosity-none   Calcification-severe   LEFT PELVIS:   Left Common Iliac Artery -   Minimal Diameter-6.7 mm   Tortuosity-none   Calcification-severe   Left External Iliac Artery -   Minimal Diameter-5.5 mm   Tortuosity-moderate   Calcification-severe   Left Common  Femoral Artery -   Minimal Diameter-5.5 mm   Tortuosity-none   Calcification-severe   Review of the MIP images confirms the above findings.   IMPRESSION: Vascular:   1. Vascular findings and measurements pertinent to potential TAVR procedure, as detailed above. 2. Thickening and calcification of the aortic valve, compatible with reported clinical history of aortic stenosis. 3. Severe aortoiliac atherosclerosis. Left main and 3 vessel coronary artery disease.   Nonvascular:   1. Subcentimeter solid pulmonary nodule of the left upper lobe is slightly increased in size when compared with March 10, 2021 prior, measures 8 mm, previously 6 mm. Additional imaging evaluation or consultation with Pulmonology or Thoracic Surgery recommended. 2. Mild L3 age indeterminate compression deformity. Correlate for point tenderness. 3. Emphysema (ICD10-J43.9).     Electronically Signed   By: Yetta Glassman M.D.   On: 10/15/2021 11:44          Impression:   This 81 year old gentleman has stage D2, low-flow/low gradient severe aortic stenosis with NYHA class II symptoms of exertional fatigue and shortness of breath consistent with chronic diastolic congestive heart failure.  I have personally reviewed his 2D echocardiogram,  cardiac catheterization, and CTA studies.  His echocardiogram shows a severely calcified and thickened aortic valve with restricted leaflet mobility.  The mean gradient was 22 mmHg with a valve area of 0.79 cm and dimensionless index of 0.19.  Cardiac catheterization showed severe stenosis of the mid left circumflex that is a relatively small vessel and unchanged from previous catheterization in 2015.  There is otherwise mild nonobstructive disease.  The mean transvalvular gradient was measured at 26 mmHg.  I agree that aortic valve replacement is indicated for relief of his symptoms and to prevent further left ventricular dysfunction.  Given his advanced age, oxygen  dependent COPD, and restricted mobility due to degenerative arthritis I do not think he is a candidate for open surgical aortic valve replacement.  Transcatheter aortic valve replacement would be a reasonable option.  His gated cardiac CTA shows anatomy suitable for TAVR using a SAPIEN 3 valve.  His abdominal and pelvic CTA shows adequate pelvic vascular anatomy to allow transfemoral insertion.   The patient was counseled at length regarding treatment alternatives for management of severe symptomatic aortic stenosis. The risks and benefits of surgical intervention has been discussed in detail. Long-term prognosis with medical therapy was discussed. Alternative approaches such as conventional surgical aortic valve replacement, transcatheter aortic valve replacement, and palliative medical therapy were compared and contrasted at length. This discussion was placed in the context of the patient's own specific clinical presentation and past medical history. All of his questions have been addressed.    Following the decision to proceed with transcatheter aortic valve replacement, a discussion was held regarding what types of management strategies would be attempted intraoperatively in the event of life-threatening complications, including whether or not the patient would be considered a candidate for the use of cardiopulmonary bypass and/or conversion to open sternotomy for attempted surgical intervention.  I do not think he is a candidate for emergent sternotomy to manage any intraoperative complications due to his advanced age, oxygen dependent COPD, and restricted mobility.  The patient is aware of the fact that transient use of cardiopulmonary bypass may be necessary. The patient has been advised of a variety of complications that might develop including but not limited to risks of death, stroke, paravalvular leak, aortic dissection or other major vascular complications, aortic annulus rupture, device  embolization, cardiac rupture or perforation, mitral regurgitation, acute myocardial infarction, arrhythmia, heart block or bradycardia requiring permanent pacemaker placement, congestive heart failure, respiratory failure, renal failure, pneumonia, infection, other late complications related to structural valve deterioration or migration, or other complications that might ultimately cause a temporary or permanent loss of functional independence or other long term morbidity. The patient provides full informed consent for the procedure as described and all questions were answered.       Plan:   Transfemoral TAVR using a SAPIEN 3 valve.        Gaye Pollack, MD

## 2021-11-25 ENCOUNTER — Inpatient Hospital Stay (HOSPITAL_COMMUNITY): Payer: Medicare Other

## 2021-11-25 ENCOUNTER — Inpatient Hospital Stay (HOSPITAL_COMMUNITY)
Admission: RE | Admit: 2021-11-25 | Discharge: 2021-11-26 | DRG: 267 | Disposition: A | Discharge status: Home / Self Care | Payer: Medicare Other | Attending: Surgery | Admitting: Surgery

## 2021-11-25 ENCOUNTER — Encounter (HOSPITAL_COMMUNITY): Payer: Self-pay | Admitting: Cardiovascular Disease

## 2021-11-25 ENCOUNTER — Other Ambulatory Visit: Payer: Self-pay | Admitting: Physician Assistant

## 2021-11-25 ENCOUNTER — Inpatient Hospital Stay (HOSPITAL_COMMUNITY)
Admission: RE | Admit: 2021-11-25 | Discharge: 2021-11-25 | Disposition: A | Discharge status: Home / Self Care | Payer: Medicare Other | Source: Ambulatory Visit | Attending: Cardiovascular Disease | Admitting: Cardiovascular Disease

## 2021-11-25 ENCOUNTER — Other Ambulatory Visit: Payer: Self-pay

## 2021-11-25 ENCOUNTER — Encounter (HOSPITAL_COMMUNITY)
Admission: RE | Disposition: A | Discharge status: Home / Self Care | Payer: Non-veteran care | Source: Home / Self Care | Attending: Surgery

## 2021-11-25 DIAGNOSIS — E871 Hypo-osmolality and hyponatremia: Secondary | ICD-10-CM | POA: Diagnosis present

## 2021-11-25 DIAGNOSIS — Z952 Presence of prosthetic heart valve: Principal | ICD-10-CM

## 2021-11-25 DIAGNOSIS — E785 Hyperlipidemia, unspecified: Secondary | ICD-10-CM | POA: Diagnosis present

## 2021-11-25 DIAGNOSIS — Z8349 Family history of other endocrine, nutritional and metabolic diseases: Secondary | ICD-10-CM | POA: Diagnosis not present

## 2021-11-25 DIAGNOSIS — Z7984 Long term (current) use of oral hypoglycemic drugs: Secondary | ICD-10-CM | POA: Diagnosis not present

## 2021-11-25 DIAGNOSIS — Z7951 Long term (current) use of inhaled steroids: Secondary | ICD-10-CM | POA: Diagnosis not present

## 2021-11-25 DIAGNOSIS — Z888 Allergy status to other drugs, medicaments and biological substances status: Secondary | ICD-10-CM | POA: Diagnosis not present

## 2021-11-25 DIAGNOSIS — Z8249 Family history of ischemic heart disease and other diseases of the circulatory system: Secondary | ICD-10-CM

## 2021-11-25 DIAGNOSIS — I350 Nonrheumatic aortic (valve) stenosis: Secondary | ICD-10-CM | POA: Diagnosis present

## 2021-11-25 DIAGNOSIS — Z006 Encounter for examination for normal comparison and control in clinical research program: Secondary | ICD-10-CM

## 2021-11-25 DIAGNOSIS — Z85048 Personal history of other malignant neoplasm of rectum, rectosigmoid junction, and anus: Secondary | ICD-10-CM

## 2021-11-25 DIAGNOSIS — I10 Essential (primary) hypertension: Secondary | ICD-10-CM | POA: Diagnosis present

## 2021-11-25 DIAGNOSIS — Z87891 Personal history of nicotine dependence: Secondary | ICD-10-CM

## 2021-11-25 DIAGNOSIS — Z79899 Other long term (current) drug therapy: Secondary | ICD-10-CM | POA: Diagnosis not present

## 2021-11-25 DIAGNOSIS — I2510 Atherosclerotic heart disease of native coronary artery without angina pectoris: Secondary | ICD-10-CM | POA: Diagnosis present

## 2021-11-25 DIAGNOSIS — J449 Chronic obstructive pulmonary disease, unspecified: Secondary | ICD-10-CM | POA: Diagnosis not present

## 2021-11-25 DIAGNOSIS — Z9981 Dependence on supplemental oxygen: Secondary | ICD-10-CM

## 2021-11-25 DIAGNOSIS — I110 Hypertensive heart disease with heart failure: Secondary | ICD-10-CM | POA: Diagnosis not present

## 2021-11-25 DIAGNOSIS — I080 Rheumatic disorders of both mitral and aortic valves: Secondary | ICD-10-CM | POA: Diagnosis not present

## 2021-11-25 DIAGNOSIS — Z885 Allergy status to narcotic agent status: Secondary | ICD-10-CM | POA: Diagnosis not present

## 2021-11-25 DIAGNOSIS — M170 Bilateral primary osteoarthritis of knee: Secondary | ICD-10-CM | POA: Diagnosis present

## 2021-11-25 DIAGNOSIS — I252 Old myocardial infarction: Secondary | ICD-10-CM | POA: Diagnosis not present

## 2021-11-25 DIAGNOSIS — I5022 Chronic systolic (congestive) heart failure: Secondary | ICD-10-CM | POA: Diagnosis not present

## 2021-11-25 DIAGNOSIS — Z20822 Contact with and (suspected) exposure to covid-19: Secondary | ICD-10-CM | POA: Diagnosis present

## 2021-11-25 DIAGNOSIS — C3412 Malignant neoplasm of upper lobe, left bronchus or lung: Secondary | ICD-10-CM | POA: Diagnosis present

## 2021-11-25 DIAGNOSIS — I340 Nonrheumatic mitral (valve) insufficiency: Secondary | ICD-10-CM | POA: Diagnosis present

## 2021-11-25 DIAGNOSIS — Z954 Presence of other heart-valve replacement: Secondary | ICD-10-CM | POA: Diagnosis not present

## 2021-11-25 DIAGNOSIS — I255 Ischemic cardiomyopathy: Secondary | ICD-10-CM | POA: Diagnosis present

## 2021-11-25 HISTORY — PX: TRANSCATHETER AORTIC VALVE REPLACEMENT, TRANSFEMORAL: SHX6400

## 2021-11-25 HISTORY — PX: INTRAOPERATIVE TRANSTHORACIC ECHOCARDIOGRAM: SHX6523

## 2021-11-25 HISTORY — DX: Presence of prosthetic heart valve: Z95.2

## 2021-11-25 LAB — POCT I-STAT, CHEM 8
BUN: 16 mg/dL (ref 8–23)
BUN: 13 mg/dL (ref 8–23)
Calcium, Ion: 1.19 mmol/L (ref 1.15–1.40)
Calcium, Ion: 1.2 mmol/L (ref 1.15–1.40)
Chloride: 99 mmol/L (ref 98–111)
Chloride: 100 mmol/L (ref 98–111)
Creatinine, Ser: 0.8 mg/dL (ref 0.61–1.24)
Creatinine, Ser: 0.8 mg/dL (ref 0.61–1.24)
Glucose, Bld: 93 mg/dL (ref 70–99)
Glucose, Bld: 100 mg/dL — ABNORMAL HIGH (ref 70–99)
HCT: 39 % (ref 39.0–52.0)
HCT: 34 % — ABNORMAL LOW (ref 39.0–52.0)
Hemoglobin: 13.3 g/dL (ref 13.0–17.0)
Hemoglobin: 11.6 g/dL — ABNORMAL LOW (ref 13.0–17.0)
Potassium: 3.9 mmol/L (ref 3.5–5.1)
Potassium: 3.7 mmol/L (ref 3.5–5.1)
Sodium: 138 mmol/L (ref 135–145)
Sodium: 138 mmol/L (ref 135–145)
TCO2: 29 mmol/L (ref 22–32)
TCO2: 29 mmol/L (ref 22–32)

## 2021-11-25 LAB — ECHOCARDIOGRAM LIMITED
AR max vel: 1.8 cm2
AV Area VTI: 1.75 cm2
AV Area mean vel: 1.58 cm2
AV Mean grad: 14 mmHg
AV Peak grad: 20.5 mmHg
Ao pk vel: 2.27 m/s

## 2021-11-25 LAB — POCT I-STAT 7, (LYTES, BLD GAS, ICA,H+H)
Acid-Base Excess: 4 mmol/L — ABNORMAL HIGH (ref 0.0–2.0)
Bicarbonate: 29 mmol/L — ABNORMAL HIGH (ref 20.0–28.0)
Calcium, Ion: 1.18 mmol/L (ref 1.15–1.40)
HCT: 40 % (ref 39.0–52.0)
Hemoglobin: 13.6 g/dL (ref 13.0–17.0)
O2 Saturation: 97 %
Potassium: 4 mmol/L (ref 3.5–5.1)
Sodium: 139 mmol/L (ref 135–145)
TCO2: 30 mmol/L (ref 22–32)
pCO2 arterial: 45.4 mmHg (ref 32–48)
pH, Arterial: 7.414 (ref 7.35–7.45)
pO2, Arterial: 93 mmHg (ref 83–108)

## 2021-11-25 LAB — ABO/RH: ABO/RH(D): A POS

## 2021-11-25 LAB — SARS CORONAVIRUS 2 BY RT PCR: SARS Coronavirus 2 by RT PCR: NEGATIVE

## 2021-11-25 SURGERY — IMPLANTATION, AORTIC VALVE, TRANSCATHETER, FEMORAL APPROACH
Anesthesia: Monitor Anesthesia Care | Site: Chest

## 2021-11-25 MED ORDER — DAPAGLIFLOZIN PROPANEDIOL 10 MG PO TABS
10.0000 mg | ORAL_TABLET | Freq: Every day | ORAL | Status: DC
  Administered 2021-11-26: 10 mg via ORAL
  Filled 2021-11-25: qty 1

## 2021-11-25 MED ORDER — CHLORHEXIDINE GLUCONATE 0.12 % MT SOLN
OROMUCOSAL | Status: AC
  Administered 2021-11-25: 15 mL
  Filled 2021-11-25: qty 15

## 2021-11-25 MED ORDER — CEFAZOLIN SODIUM-DEXTROSE 2-4 GM/100ML-% IV SOLN
2.0000 g | Freq: Three times a day (TID) | INTRAVENOUS | Status: AC
  Administered 2021-11-25 (×2): 2 g via INTRAVENOUS
  Filled 2021-11-25 (×2): qty 100

## 2021-11-25 MED ORDER — TRAZODONE HCL 50 MG PO TABS
50.0000 mg | ORAL_TABLET | Freq: Every day | ORAL | Status: DC
  Administered 2021-11-25: 50 mg via ORAL
  Filled 2021-11-25: qty 1

## 2021-11-25 MED ORDER — ACETAMINOPHEN 325 MG PO TABS: 650.0000 mg | ORAL_TABLET | Freq: Four times a day (QID) | ORAL | Status: DC | PRN

## 2021-11-25 MED ORDER — OXYCODONE HCL 5 MG PO TABS: 5.0000 mg | ORAL_TABLET | ORAL | Status: DC | PRN

## 2021-11-25 MED ORDER — CARVEDILOL 6.25 MG PO TABS
6.2500 mg | ORAL_TABLET | Freq: Two times a day (BID) | ORAL | Status: DC
  Administered 2021-11-25 – 2021-11-26 (×2): 6.25 mg via ORAL
  Filled 2021-11-25 (×2): qty 1

## 2021-11-25 MED ORDER — ONDANSETRON HCL 4 MG/2ML IJ SOLN
INTRAMUSCULAR | Status: AC
  Filled 2021-11-25: qty 2

## 2021-11-25 MED ORDER — LACTATED RINGERS IV SOLN: INTRAVENOUS | Status: DC | PRN

## 2021-11-25 MED ORDER — IODIXANOL 320 MG/ML IV SOLN
INTRAVENOUS | Status: DC | PRN
  Administered 2021-11-25: 100 mL

## 2021-11-25 MED ORDER — HEPARIN 6000 UNIT IRRIGATION SOLUTION
Status: AC
  Filled 2021-11-25: qty 1500

## 2021-11-25 MED ORDER — CHLORHEXIDINE GLUCONATE 0.12 % MT SOLN: 15.0000 mL | OROMUCOSAL | Status: AC

## 2021-11-25 MED ORDER — ASPIRIN 81 MG PO TABS: 81.0000 mg | ORAL_TABLET | Freq: Every day | ORAL | Status: DC

## 2021-11-25 MED ORDER — SODIUM CHLORIDE 0.9% FLUSH: 3.0000 mL | INTRAVENOUS | Status: DC | PRN

## 2021-11-25 MED ORDER — ONDANSETRON HCL 4 MG/2ML IJ SOLN: 4.0000 mg | Freq: Four times a day (QID) | INTRAMUSCULAR | Status: DC | PRN

## 2021-11-25 MED ORDER — NITROGLYCERIN IN D5W 200-5 MCG/ML-% IV SOLN: 0.0000 ug/min | INTRAVENOUS | Status: DC

## 2021-11-25 MED ORDER — CHLORHEXIDINE GLUCONATE 4 % EX LIQD: 60.0000 mL | Freq: Once | CUTANEOUS | Status: DC

## 2021-11-25 MED ORDER — PROPOFOL 500 MG/50ML IV EMUL
INTRAVENOUS | Status: DC | PRN
  Administered 2021-11-25: 20 ug/kg/min via INTRAVENOUS

## 2021-11-25 MED ORDER — DEXMEDETOMIDINE (PRECEDEX) IN NS 20 MCG/5ML (4 MCG/ML) IV SYRINGE
PREFILLED_SYRINGE | INTRAVENOUS | Status: DC | PRN
  Administered 2021-11-25: 39 ug via INTRAVENOUS

## 2021-11-25 MED ORDER — UMECLIDINIUM BROMIDE 62.5 MCG/ACT IN AEPB
1.0000 | INHALATION_SPRAY | Freq: Every day | RESPIRATORY_TRACT | Status: DC
  Administered 2021-11-26: 1 via RESPIRATORY_TRACT
  Filled 2021-11-25: qty 7

## 2021-11-25 MED ORDER — MAGNESIUM OXIDE -MG SUPPLEMENT 400 (240 MG) MG PO TABS
400.0000 mg | ORAL_TABLET | Freq: Every day | ORAL | Status: DC
  Administered 2021-11-26: 400 mg via ORAL
  Filled 2021-11-25: qty 1

## 2021-11-25 MED ORDER — LIDOCAINE HCL 1 % IJ SOLN
INTRAMUSCULAR | Status: DC | PRN
  Administered 2021-11-25: 20 mL via INTRADERMAL

## 2021-11-25 MED ORDER — SODIUM CHLORIDE 0.9% FLUSH
3.0000 mL | Freq: Two times a day (BID) | INTRAVENOUS | Status: DC
  Administered 2021-11-25: 3 mL via INTRAVENOUS

## 2021-11-25 MED ORDER — PROTAMINE SULFATE 10 MG/ML IV SOLN
INTRAVENOUS | Status: DC | PRN
  Administered 2021-11-25: 150 mg via INTRAVENOUS

## 2021-11-25 MED ORDER — SODIUM CHLORIDE 0.9 % IV SOLN: 250.0000 mL | INTRAVENOUS | Status: DC | PRN

## 2021-11-25 MED ORDER — HEPARIN 6000 UNIT IRRIGATION SOLUTION
Status: DC | PRN
  Administered 2021-11-25 (×3): 1

## 2021-11-25 MED ORDER — ONDANSETRON HCL 4 MG/2ML IJ SOLN
INTRAMUSCULAR | Status: DC | PRN
  Administered 2021-11-25: 4 mg via INTRAVENOUS

## 2021-11-25 MED ORDER — ACETAMINOPHEN 650 MG RE SUPP: 650.0000 mg | Freq: Four times a day (QID) | RECTAL | Status: DC | PRN

## 2021-11-25 MED ORDER — ACETAMINOPHEN 500 MG PO TABS
1000.0000 mg | ORAL_TABLET | Freq: Once | ORAL | Status: AC
  Administered 2021-11-25: 1000 mg via ORAL
  Filled 2021-11-25: qty 2

## 2021-11-25 MED ORDER — PROTAMINE SULFATE 10 MG/ML IV SOLN
INTRAVENOUS | Status: AC
  Filled 2021-11-25: qty 25

## 2021-11-25 MED ORDER — PHENYLEPHRINE 80 MCG/ML (10ML) SYRINGE FOR IV PUSH (FOR BLOOD PRESSURE SUPPORT)
PREFILLED_SYRINGE | INTRAVENOUS | Status: AC
Start: 2021-11-25 — End: ?
  Filled 2021-11-25: qty 10

## 2021-11-25 MED ORDER — SODIUM CHLORIDE 0.9 % IV SOLN: INTRAVENOUS | Status: AC

## 2021-11-25 MED ORDER — EZETIMIBE 10 MG PO TABS
10.0000 mg | ORAL_TABLET | Freq: Every day | ORAL | Status: DC
  Administered 2021-11-26: 10 mg via ORAL
  Filled 2021-11-25: qty 1

## 2021-11-25 MED ORDER — SODIUM CHLORIDE 0.9 % IV SOLN: INTRAVENOUS | Status: DC

## 2021-11-25 MED ORDER — ASPIRIN 81 MG PO TBEC
81.0000 mg | DELAYED_RELEASE_TABLET | Freq: Every day | ORAL | Status: DC
  Administered 2021-11-25 – 2021-11-26 (×2): 81 mg via ORAL
  Filled 2021-11-25 (×2): qty 1

## 2021-11-25 MED ORDER — FLUTICASONE FUROATE-VILANTEROL 100-25 MCG/ACT IN AEPB
1.0000 | INHALATION_SPRAY | Freq: Every day | RESPIRATORY_TRACT | Status: DC
  Administered 2021-11-26: 1 via RESPIRATORY_TRACT
  Filled 2021-11-25: qty 28

## 2021-11-25 MED ORDER — PROPOFOL 10 MG/ML IV BOLUS
INTRAVENOUS | Status: DC | PRN
  Administered 2021-11-25: 20 mg via INTRAVENOUS

## 2021-11-25 MED ORDER — HEPARIN SODIUM (PORCINE) 1000 UNIT/ML IJ SOLN
INTRAMUSCULAR | Status: DC | PRN
  Administered 2021-11-25: 15000 [IU] via INTRAVENOUS

## 2021-11-25 MED ORDER — LACTATED RINGERS IV SOLN: INTRAVENOUS | Status: DC

## 2021-11-25 MED ORDER — LIDOCAINE HCL 1 % IJ SOLN
INTRAMUSCULAR | Status: AC
  Filled 2021-11-25: qty 20

## 2021-11-25 MED ORDER — FUROSEMIDE 20 MG PO TABS
20.0000 mg | ORAL_TABLET | Freq: Every day | ORAL | Status: DC
  Administered 2021-11-26: 20 mg via ORAL
  Filled 2021-11-25: qty 1

## 2021-11-25 MED ORDER — CHLORHEXIDINE GLUCONATE 4 % EX LIQD: 30.0000 mL | CUTANEOUS | Status: DC

## 2021-11-25 MED ORDER — PHENYLEPHRINE HCL (PRESSORS) 10 MG/ML IV SOLN
INTRAVENOUS | Status: DC | PRN
  Administered 2021-11-25: 160 ug via INTRAVENOUS

## 2021-11-25 MED ORDER — CHLORHEXIDINE GLUCONATE 0.12 % MT SOLN: 15.0000 mL | Freq: Once | OROMUCOSAL | Status: DC

## 2021-11-25 SURGICAL SUPPLY — 67 items
ADH SKN CLS APL DERMABOND .7 (GAUZE/BANDAGES/DRESSINGS) ×2
APL PRP STRL LF DISP 70% ISPRP (MISCELLANEOUS) ×4
BAG COUNTER SPONGE SURGICOUNT (BAG) ×2 IMPLANT
BAG DECANTER FOR FLEXI CONT (MISCELLANEOUS) IMPLANT
BAG SPNG CNTER NS LX DISP (BAG) ×2
BLADE CLIPPER SURG (BLADE) IMPLANT
BLADE OSCILLATING /SAGITTAL (BLADE) IMPLANT
BLADE STERNUM SYSTEM 6 (BLADE) IMPLANT
CABLE ADAPT CONN TEMP 6FT (ADAPTER) ×2 IMPLANT
CATH DIAG EXPO 6F AL1 (CATHETERS) IMPLANT
CATH DIAG EXPO 6F VENT PIG 145 (CATHETERS) ×4 IMPLANT
CATH INFINITI 6F AL2 (CATHETERS) IMPLANT
CATH S G BIP PACING (CATHETERS) ×2 IMPLANT
CHLORAPREP W/TINT 26 (MISCELLANEOUS) ×2 IMPLANT
CLOSURE MYNX CONTROL 6F/7F (Vascular Products) IMPLANT
CLOSURE PERCLOSE PROSTYLE (VASCULAR PRODUCTS) IMPLANT
CNTNR URN SCR LID CUP LEK RST (MISCELLANEOUS) ×4 IMPLANT
CONT SPEC 4OZ STRL OR WHT (MISCELLANEOUS) ×4
COVER BACK TABLE 80X110 HD (DRAPES) ×2 IMPLANT
DERMABOND ADVANCED (GAUZE/BANDAGES/DRESSINGS) ×2
DERMABOND ADVANCED .7 DNX12 (GAUZE/BANDAGES/DRESSINGS) ×2 IMPLANT
DEVICE CLOSURE PERCLS PRGLD 6F (VASCULAR PRODUCTS) ×4 IMPLANT
DRSG TEGADERM 4X10 (GAUZE/BANDAGES/DRESSINGS) IMPLANT
DRSG TEGADERM 4X4.75 (GAUZE/BANDAGES/DRESSINGS) ×4 IMPLANT
ELECT REM PT RETURN 9FT ADLT (ELECTROSURGICAL) ×2
ELECTRODE REM PT RTRN 9FT ADLT (ELECTROSURGICAL) ×2 IMPLANT
GAUZE SPONGE 4X4 12PLY STRL (GAUZE/BANDAGES/DRESSINGS) ×2 IMPLANT
GLOVE BIO SURGEON STRL SZ7.5 (GLOVE) ×2 IMPLANT
GLOVE BIOGEL PI IND STRL 6.5 (GLOVE) IMPLANT
GLOVE BIOGEL PI INDICATOR 6.5 (GLOVE) ×2
GLOVE ECLIPSE 7.0 STRL STRAW (GLOVE) ×2 IMPLANT
GLOVE SURG SS PI 7.5 STRL IVOR (GLOVE) IMPLANT
GOWN STRL REUS W/ TWL LRG LVL3 (GOWN DISPOSABLE) IMPLANT
GOWN STRL REUS W/ TWL XL LVL3 (GOWN DISPOSABLE) ×2 IMPLANT
GOWN STRL REUS W/TWL LRG LVL3 (GOWN DISPOSABLE) ×2
GOWN STRL REUS W/TWL XL LVL3 (GOWN DISPOSABLE) ×6
KIT BASIN OR (CUSTOM PROCEDURE TRAY) ×2 IMPLANT
KIT HEART LEFT (KITS) ×2 IMPLANT
KIT SAPIAN 3 ULTRA RESILIA 26 (Valve) IMPLANT
KIT TURNOVER KIT B (KITS) ×2 IMPLANT
NS IRRIG 1000ML POUR BTL (IV SOLUTION) ×2 IMPLANT
PACK ENDO MINOR (CUSTOM PROCEDURE TRAY) ×2 IMPLANT
PAD ARMBOARD 7.5X6 YLW CONV (MISCELLANEOUS) ×4 IMPLANT
PAD ELECT DEFIB RADIOL ZOLL (MISCELLANEOUS) ×2 IMPLANT
PERCLOSE PROGLIDE 6F (VASCULAR PRODUCTS) ×4
POSITIONER HEAD DONUT 9IN (MISCELLANEOUS) ×2 IMPLANT
SET MICROPUNCTURE 5F STIFF (MISCELLANEOUS) ×2 IMPLANT
SHEATH BRITE TIP 7FR 35CM (SHEATH) ×2 IMPLANT
SHEATH PINNACLE 6F 10CM (SHEATH) ×2 IMPLANT
SHEATH PINNACLE 8F 10CM (SHEATH) ×2 IMPLANT
SLEEVE REPOSITIONING LENGTH 30 (MISCELLANEOUS) ×2 IMPLANT
SPIKE FLUID TRANSFER (MISCELLANEOUS) ×2 IMPLANT
SPONGE T-LAP 18X18 ~~LOC~~+RFID (SPONGE) ×2 IMPLANT
STOPCOCK MORSE 400PSI 3WAY (MISCELLANEOUS) ×4 IMPLANT
SUT PROLENE 6 0 C 1 30 (SUTURE) IMPLANT
SUT SILK  1 MH (SUTURE) ×2
SUT SILK 1 MH (SUTURE) ×2 IMPLANT
SYR 30ML LL (SYRINGE) IMPLANT
SYR 50ML LL SCALE MARK (SYRINGE) ×2 IMPLANT
SYR BULB IRRIG 60ML STRL (SYRINGE) IMPLANT
TOWEL GREEN STERILE (TOWEL DISPOSABLE) ×4 IMPLANT
TRANSDUCER W/STOPCOCK (MISCELLANEOUS) ×4 IMPLANT
TUBING ART PRESS 72  MALE/FEM (TUBING) ×2
TUBING ART PRESS 72 MALE/FEM (TUBING) IMPLANT
WIRE EMERALD 3MM-J .035X150CM (WIRE) ×2 IMPLANT
WIRE EMERALD 3MM-J .035X260CM (WIRE) ×2 IMPLANT
WIRE EMERALD ST .035X260CM (WIRE) ×4 IMPLANT

## 2021-11-25 NOTE — Anesthesia Postprocedure Evaluation (Signed)
Anesthesia Post Note  Patient: Curtis Campos  Procedure(s) Performed: Transcatheter Aortic Valve Replacement, Transfemoral using a 26 MM Edwards SAPIEN 3 Ultra. (Left: Chest) INTRAOPERATIVE TRANSTHORACIC ECHOCARDIOGRAM     Patient location during evaluation: Cath Lab Anesthesia Type: MAC Level of consciousness: awake and alert Pain management: pain level controlled Vital Signs Assessment: post-procedure vital signs reviewed and stable Respiratory status: spontaneous breathing, nonlabored ventilation, respiratory function stable and patient connected to nasal cannula oxygen Cardiovascular status: stable and blood pressure returned to baseline Postop Assessment: no apparent nausea or vomiting Anesthetic complications: no   No notable events documented.  Last Vitals:  Vitals:   11/25/21 1402 11/25/21 1601  BP: (!) 93/41 129/62  Pulse: (!) 56 (!) 55  Resp:  18  Temp: 36.8 C   SpO2:  93%    Last Pain:  Vitals:   11/25/21 1402  TempSrc: Temporal  PainSc: 0-No pain                 Santa Lighter

## 2021-11-25 NOTE — Anesthesia Procedure Notes (Signed)
Arterial Line Insertion Start/End8/29/2023 9:48 AM, 11/25/2021 9:48 AM Performed by: CRNA  Patient location: [facility] procedure area. Preanesthetic checklist: patient identified, IV checked, site marked, risks and benefits discussed, surgical consent, monitors and equipment checked, pre-op evaluation, timeout performed and anesthesia consent Lidocaine 1% used for infiltration Right, radial was placed Catheter size: 20 G Hand hygiene performed , maximum sterile barriers used  and Seldinger technique used Allen's test indicative of satisfactory collateral circulation Attempts: 1 Procedure performed without using ultrasound guided technique. Following insertion, dressing applied and Biopatch. Post procedure assessment: normal  Patient tolerated the procedure well with no immediate complications.

## 2021-11-25 NOTE — Progress Notes (Signed)
  Echocardiogram 2D Echocardiogram has been performed.  Darlina Sicilian M 11/25/2021, 12:35 PM

## 2021-11-25 NOTE — Discharge Instructions (Signed)

## 2021-11-25 NOTE — Discharge Summary (Incomplete)
[place] VALVE TEAM  Discharge Summary    Patient ID: Curtis Campos MRN: [id number]; DOB: [birth year]  Admit date: 11/25/2021 Discharge date: 11/26/2021  Primary Care Provider: Rusty Aus, MD  Primary Cardiologist: Kathlyn Sacramento, MD / Dr. Angelena Form & Dr. Cyndia Bent (TAVR)  Discharge Diagnoses    Principal Problem:   S/P TAVR (transcatheter aortic valve replacement) Active Problems:   HLD (hyperlipidemia)   Essential hypertension   Osteoarthritis of knees, bilateral   Chronic obstructive pulmonary disease (HCC)   Coronary artery disease   Chronic systolic heart failure (HCC)   Mitral regurgitation   Chronic hyponatremia   Severe aortic stenosis   Allergies Allergies  Allergen Reactions   Codeine Nausea Only   Statins Other (See Comments)    REACTION: maylgias    Diagnostic Studies/Procedures    TAVR OPERATIVE NOTE     Date of Procedure:                11/25/2021   Preoperative Diagnosis:      Severe Aortic Stenosis    Postoperative Diagnosis:    Same    Procedure:        Transcatheter Aortic Valve Replacement - Transfemoral Approach             Edwards Sapien 3 THV (size 26 mm, model # [id number], serial # [id number])              Co-Surgeons:                        Lauree Chandler, MD and Gaye Pollack, MD    Anesthesiologist:                  Gifford Shave   Echocardiographer:              Gasper Sells   Pre-operative Echo Findings: Severe aortic stenosis Mild left ventricular systolic dysfunction   Post-operative Echo Findings: No paravalvular leak Mild left ventricular systolic dysfunction _____________    Echo 11/26/21: completed but pending formal read at the time of discharge   History of Present Illness     Curtis Campos is a 81 y.o. male with a history of COPD on home 02, former tobacco abuse, lung nodule followed at [facility], CAD, ischemic CM, HFrEF, arthritis with poor functional capacity, mitral  regurgitation and severe AS who presented to [facility] on 11/22/21 for planned TAVR.   He has a history of moderate aortic stenosis dating back to 2017 when his ejection fraction was 30 to 35%.  His ejection fraction was improved in 2022 to 55% but is now declined again to 40 to 45%.  2D echocardiogram in 05/21/21 showed EF 40-45%, and severe AS with a mean grad 22 mmHg, peak grad 39.1 mmHg, AVA 0.79 cm2, DVI 0.19, SVI 33, mild-mod MR and mild TR. He was seen by Dr. Burt Knack on 06/30/21 for TAVR evaluation. He was initially reluctant to proceed, but later called back and wanted to pursue TAVR work up and set up for [facility].   [facility] 08/27/21 showed severe single-vessel coronary artery disease with severe stenosis of the mid circumflex unchanged from the previous cath study in 2015. There was also mild nonobstructive LAD plaquing and mild ostial diagonal stenoses and mild nonobstructive mid RCA stenosis and severe distal PDA stenosis unchanged from previous cath study. There was severe low-flow low gradient aortic stenosis with mean transvalvular gradient of 26 mmHg, aortic valve is  calcified and restricted on plain fluoroscopy. Medical therapy was planned for his CAD. Pre TAVR CTs showed a enlarging left upper pulmonary nodule. [facility] pulm ordered a PET scan and nodule is suspicious for stage I A primary bronchogenic carcinoma.  The patient has been evaluated by the multidisciplinary valve team and felt to have severe, symptomatic aortic stenosis and to be a suitable candidate for TAVR, which was set up for 11/25/21.   Hospital Course     Consultants: none   Severe AS: s/p successful TAVR with a 26 mm Edwards Sapien 3 Ultra Resilia THV via the TF approach on 11/25/21. Post operative echo completed but pending formal read. Groin sites are stable. ECG with sinus and no high grade heart block. Continued on home Asprin 81mg  daily. Plan for discharge home today with close follow up in the office next week.    HTN: BP mildly  elevated. Resume home meds.    COPD: stable    ICM/HFrEF: continue GDMT with Lindi Adie, Coreg as well as Lasix.    CAD: no chest pain. Continue Aspirin, Praulant and Zetia   Pulmonary nodule: pre TAVR CTs showed a enlarging left upper pulmonary nodule. Follow up PET scan showed probable bronchogenic carcinoma with no evidence of mets. Continue follow up at [facility]. _____________  Discharge Vitals Blood pressure (!) 145/51, pulse 76, temperature 98.1 F (36.7 C), temperature source Oral, resp. rate 18, height 5\' 7"  (1.702 m), weight 80.1 kg, SpO2 92 %.  Filed Weights   11/25/21 0827 11/26/21 0410  Weight: 78 kg 80.1 kg     GEN: Well nourished, well developed, in no acute distress HEENT: normal Neck: no JVD or masses Cardiac: RRR; no murmurs, rubs, or gallops,no edema  Respiratory:  clear to auscultation bilaterally, normal work of breathing GI: soft, nontender, nondistended, + BS MS: no deformity or atrophy Skin: warm and dry, no rash.  Groin sites clear without hematoma or ecchymosis  Neuro:  Alert and Oriented x 3, Strength and sensation are intact Psych: euthymic mood, full affect   Labs & Radiologic Studies    CBC Recent Labs    11/25/21 1317 11/26/21 0554  WBC  --  8.9  HGB 11.6* 11.9*  HCT 34.0* 36.5*  MCV  --  91.3  PLT  --  300   Basic Metabolic Panel Recent Labs    11/25/21 1317 11/26/21 0554  NA 138 136  K 3.7 3.6  CL 100 104  CO2  --  26  GLUCOSE 100* 81  BUN 13 10  CREATININE 0.80 0.78  CALCIUM  --  8.5*  MG  --  1.9   Liver Function Tests No results for input(s): "AST", "ALT", "ALKPHOS", "BILITOT", "PROT", "ALBUMIN" in the last 72 hours. No results for input(s): "LIPASE", "AMYLASE" in the last 72 hours. Cardiac Enzymes No results for input(s): "CKTOTAL", "CKMB", "CKMBINDEX", "TROPONINI" in the last 72 hours. BNP Invalid input(s): "POCBNP" D-Dimer No results for input(s): "DDIMER" in the last 72 hours. Hemoglobin A1C No  results for input(s): "HGBA1C" in the last 72 hours. Fasting Lipid Panel No results for input(s): "CHOL", "HDL", "LDLCALC", "TRIG", "CHOLHDL", "LDLDIRECT" in the last 72 hours. Thyroid Function Tests No results for input(s): "TSH", "T4TOTAL", "T3FREE", "THYROIDAB" in the last 72 hours.  Invalid input(s): "FREET3" _____________  ECHOCARDIOGRAM LIMITED  Result Date: 11/25/2021    ECHOCARDIOGRAM LIMITED REPORT   Patient Name:   Curtis Campos Date of Exam: 11/25/2021 Medical Rec #:  [id number]  Height:       67.0 in Accession #:    [id number]       Weight:       175.7 lb Date of Birth:  1940-09-24        BSA:          1.914 m Patient Age:    13 years         BP:           159/96 mmHg Patient Gender: M                HR:           58 bpm. Exam Location:  Inpatient Procedure: Limited Echo, Cardiac Doppler and Color Doppler Indications:    Aortic stenosis I35.0  History:        Patient has prior history of Echocardiogram examinations, most                 recent 05/21/2021. CHF, Previous Myocardial Infarction and CAD,                 Aortic Valve Disease; Risk Factors:Hypertension and                 Dyslipidemia. Ischemic cardiomyopathy.  Sonographer:    Darlina Sicilian RDCS Referring Phys: [place]  1. Interventional TTE for TAVR Placement  2. Prior to procedural, low flow low gradient AS present. No AI. Mean gradient 31 mm Hg, Peak gradient 52 mm Hg, DVI 0.16, AVA 0.8 cm2.  3. Post procedure, placement of a 26 mm Sapien Valve. Aortic valve regurgitation is not visualized (No PVL). Procedure Date: 11/25/2021. Mean gradient 3 mm Hg. Peak Gradient 7 mm Hg, DVI 0.63, EOA 3.09 cm2.  4. Left ventricular ejection fraction, by estimation, is 40 to 45%. The left ventricle has mildly decreased function.  5. Moderate to severe mitral valve regurgitation. Comparison(s): Successful TAVR Placement. Will follow MR in subsequent study. FINDINGS  Left Ventricle: Left ventricular ejection  fraction, by estimation, is 40 to 45%. The left ventricle has mildly decreased function. Pericardium: There is no evidence of pericardial effusion. Mitral Valve: Moderate to severe mitral valve regurgitation. Aortic Valve: The aortic valve is tricuspid. Aortic valve regurgitation is not visualized. Aortic valve mean gradient measures 14.0 mmHg. Aortic valve peak gradient measures 20.5 mmHg. Aortic valve area, by VTI measures 1.75 cm. There is a 26 mm Sapien prosthetic, stented (TAVR) valve present in the aortic position. LEFT VENTRICLE PLAX 2D LVOT diam:     2.50 cm LV SV:         108 LV SV Index:   57 LVOT Area:     4.91 cm  AORTIC VALVE AV Area (Vmax):    1.80 cm AV Area (Vmean):   1.58 cm AV Area (VTI):     1.75 cm AV Vmax:           226.50 cm/s AV Vmean:          160.750 cm/s AV VTI:            0.618 m AV Peak Grad:      20.5 mmHg AV Mean Grad:      14.0 mmHg LVOT Vmax:         83.00 cm/s LVOT Vmean:        51.900 cm/s LVOT VTI:          0.221 m LVOT/AV VTI ratio: 0.36  SHUNTS Systemic VTI:  0.22 m Systemic  Diam: 2.50 cm Rudean Haskell MD Electronically signed by Rudean Haskell MD Signature Date/Time: 11/25/2021/4:30:37 PM    Final    Structural Heart Procedure  Result Date: 11/25/2021 See surgical note for result.  DG Chest 2 View  Result Date: 11/23/2021 CLINICAL DATA:  Preop evaluation for aortic valve replacement EXAM: CHEST - 2 VIEW COMPARISON:  Previous studies including the PET-CT done on 11/14/2021 FINDINGS: Cardiac size is within normal limits. Increase in AP diameter of chest suggests COPD. There are no signs of alveolar pulmonary edema. There is increase in interstitial markings in both lungs, more so in the upper lung fields. There is no focal pulmonary consolidation. There is no pleural effusion or pneumothorax. IMPRESSION: COPD. Increased interstitial markings are seen in both lungs suggesting possible scarring. There is no evidence of alveolar pulmonary edema. There is no  focal pulmonary consolidation. Electronically Signed   By: Elmer Picker M.D.   On: 11/23/2021 09:31   NM PET Image Initial (PI) Skull Base To Thigh (F-18 FDG)  Result Date: 11/14/2021 CLINICAL DATA:  Initial treatment strategy for enlarging left upper lobe pulmonary nodule on prior CT. History of rectal cancer with prior ostomy. EXAM: NUCLEAR MEDICINE PET SKULL BASE TO THIGH TECHNIQUE: 9.5 mCi F-18 FDG was injected intravenously. Full-ring PET imaging was performed from the skull base to thigh after the radiotracer. CT data was obtained and used for attenuation correction and anatomic localization. Fasting blood glucose: 96 mg/dl COMPARISON:  CTA of chest abdomen and pelvis of 10/15/2021 FINDINGS: Mediastinal blood pool activity: SUV max 1.9 Liver activity: SUV max NA NECK: No areas of abnormal hypermetabolism. Incidental CT findings: Bilateral carotid atherosclerotic. Advanced cerebral atrophy. CHEST: No thoracic nodal hypermetabolism. The posterior left upper lobe pulmonary nodule measures 8 mm and a S.U.V. max of 2.8 on 92/2. Similar in size to 10/15/2021. Incidental CT findings: Moderate centrilobular emphysema. Biapical pleuroparenchymal scarring. Aortic and coronary artery calcification. Aortic valve calcifications, as before. ABDOMEN/PELVIS: No abdominopelvic parenchymal or nodal hypermetabolism. Incidental CT findings: Deferred to recent diagnostic CT. Renal vascular calcification. Normal adrenal glands. Small gallstones. Normal noncontrast appearance of the liver, pancreas, spleen. Infrarenal nonaneurysmal aortic dilatation at 2.7 cm. Abdominal perineal resection and sigmoid colostomy. SKELETON: No abnormal marrow activity. Incidental CT findings: Remote lower left rib fractures. IMPRESSION: 1. The left upper lobe pulmonary nodule of interest is hypermetabolic, suspicious for stage I A primary bronchogenic carcinoma. Isolated pulmonary metastasis felt less likely. 2. No thoracic nodal or  extrathoracic hypermetabolic metastasis. 3. Incidental findings, including: Aortic atherosclerosis (ICD10-I70.0), coronary artery atherosclerosis and emphysema (ICD10-J43.9). Cholelithiasis. Electronically Signed   By: Abigail Miyamoto M.D.   On: 11/14/2021 15:30    Disposition   Pt is being discharged home today in good condition.  Follow-up Plans & Appointments     Follow-up Information     Eileen Stanford, PA-C. Go on 12/03/2021.   Specialties: Cardiology, Radiology Why: @ 3:30 pm, please arrive at least 10 minutes early. Contact information: [address] [place] [address] [phone]                  Discharge Medications   Allergies as of 11/26/2021       Reactions   Codeine Nausea Only   Statins Other (See Comments)   REACTION: maylgias        Medication List     TAKE these medications    albuterol 108 (90 Base) MCG/ACT inhaler Commonly known as: VENTOLIN HFA INHALE 2 PUFFS BY  MOUTH EVERY 6 HOURS AS NEEDED FOR SHORTNESS OF BREATH   albuterol (2.5 MG/3ML) 0.083% nebulizer solution Commonly known as: PROVENTIL Take 2.5 mg by nebulization every 6 (six) hours as needed for shortness of breath or wheezing.   Alirocumab 75 MG/ML Soaj Commonly known as: Praluent Inject 1 pen into the skin every 14 (fourteen) days.   aspirin 81 MG tablet Take 81 mg by mouth at bedtime.   carvedilol 12.5 MG tablet Commonly known as: COREG Take 6.25 mg by mouth 2 (two) times daily. What changed: Another medication with the same name was removed. Continue taking this medication, and follow the directions you see here.   cholecalciferol 25 MCG (1000 UNIT) tablet Commonly known as: VITAMIN D3 Take 1,000 Units by mouth daily.   dapagliflozin propanediol 10 MG Tabs tablet Commonly known as: Farxiga Take 1 tablet (10 mg total) by mouth daily before breakfast.   ezetimibe 10 MG tablet Commonly known as: ZETIA Take 1 tablet by mouth once daily for  cholesterol. NEED APPOINTMENT FOR ANY MORE REFILLS   furosemide 20 MG tablet Commonly known as: LASIX TAKE 1 TABLET BY MOUTH ONCE DAILY. What changed: when to take this   guaiFENesin 100 MG/5ML liquid Commonly known as: ROBITUSSIN Take 10 mLs by mouth every 4 (four) hours as needed for cough or to loosen phlegm.   magnesium oxide 400 (240 Mg) MG tablet Commonly known as: MAG-OX TAKE 1 TABLET BY MOUTH ONCE DAILY.   OXYGEN Inhale 4 L into the lungs at bedtime.   sacubitril-valsartan 49-51 MG Commonly known as: ENTRESTO Take 0.5 tablets by mouth 2 (two) times daily.   traZODone 50 MG tablet Commonly known as: DESYREL Take 1 tablet (50 mg total) by mouth at bedtime as needed. for sleep What changed:  when to take this additional instructions   Trelegy Ellipta 100-62.5-25 MCG/ACT Aepb Generic drug: Fluticasone-Umeclidin-Vilant Inhale 1 puff into the lungs daily.            Outstanding Labs/Studies   none  Duration of Discharge Encounter   Greater than 30 minutes including physician time.  Mable Fill, PA-C 11/26/2021, 9:06 AM [phone]

## 2021-11-25 NOTE — Op Note (Signed)
[facility]   MULTIDISCIPLINARY HEART VALVE TEAM   TAVR OPERATIVE NOTE   Date of Procedure:  11/25/2021  Preoperative Diagnosis: Severe Aortic Stenosis   Postoperative Diagnosis: Same   Procedure:   Transcatheter Aortic Valve Replacement - Percutaneous Left Transfemoral Approach  Edwards Sapien 3 Ultra ResiliaTHV (size 26 mm, model # [id number], serial # [id number])   Co-Surgeons: Gaye Pollack, MD and Lauree Chandler, MD   Anesthesiologist:  S. Gifford Shave, MD  Echocardiographer:  Osborne Oman, MD  Pre-operative Echo Findings: Severe aortic stenosis Mild left ventricular systolic dysfunction  Post-operative Echo Findings: No paravalvular leak Unchanged mild left ventricular systolic dysfunction   BRIEF CLINICAL NOTE AND INDICATIONS FOR SURGERY  This 81 year old gentleman has stage D2, low-flow/low gradient severe aortic stenosis with NYHA class II symptoms of exertional fatigue and shortness of breath consistent with chronic diastolic congestive heart failure.  I have personally reviewed his 2D echocardiogram, cardiac catheterization, and CTA studies.  His echocardiogram shows a severely calcified and thickened aortic valve with restricted leaflet mobility.  The mean gradient was 22 mmHg with a valve area of 0.79 cm and dimensionless index of 0.19.  Cardiac catheterization showed severe stenosis of the mid left circumflex that is a relatively small vessel and unchanged from previous catheterization in 2015.  There is otherwise mild nonobstructive disease.  The mean transvalvular gradient was measured at 26 mmHg.  I agree that aortic valve replacement is indicated for relief of his symptoms and to prevent further left ventricular dysfunction.  Given his advanced age, oxygen dependent COPD, and restricted mobility due to degenerative arthritis I do not think he is a candidate for open surgical aortic valve replacement.  Transcatheter aortic valve replacement would be  a reasonable option.  His gated cardiac CTA shows anatomy suitable for TAVR using a SAPIEN 3 valve.  His abdominal and pelvic CTA shows adequate pelvic vascular anatomy to allow transfemoral insertion.   The patient was counseled at length regarding treatment alternatives for management of severe symptomatic aortic stenosis. The risks and benefits of surgical intervention has been discussed in detail. Long-term prognosis with medical therapy was discussed. Alternative approaches such as conventional surgical aortic valve replacement, transcatheter aortic valve replacement, and palliative medical therapy were compared and contrasted at length. This discussion was placed in the context of the patient's own specific clinical presentation and past medical history. All of his questions have been addressed.    Following the decision to proceed with transcatheter aortic valve replacement, a discussion was held regarding what types of management strategies would be attempted intraoperatively in the event of life-threatening complications, including whether or not the patient would be considered a candidate for the use of cardiopulmonary bypass and/or conversion to open sternotomy for attempted surgical intervention.  I do not think he is a candidate for emergent sternotomy to manage any intraoperative complications due to his advanced age, oxygen dependent COPD, and restricted mobility.  The patient is aware of the fact that transient use of cardiopulmonary bypass may be necessary. The patient has been advised of a variety of complications that might develop including but not limited to risks of death, stroke, paravalvular leak, aortic dissection or other major vascular complications, aortic annulus rupture, device embolization, cardiac rupture or perforation, mitral regurgitation, acute myocardial infarction, arrhythmia, heart block or bradycardia requiring permanent pacemaker placement, congestive heart failure,  respiratory failure, renal failure, pneumonia, infection, other late complications related to structural valve deterioration or migration, or other complications that might ultimately cause  a temporary or permanent loss of functional independence or other long term morbidity. The patient provides full informed consent for the procedure as described and all questions were answered.     DETAILS OF THE OPERATIVE PROCEDURE  PREPARATION:    The patient was brought to the operating room on the above mentioned date and appropriate monitoring was established by the anesthesia team. The patient was placed in the supine position on the operating table.  Intravenous antibiotics were administered. The patient was monitored closely throughout the procedure under conscious sedation.  Baseline transthoracic echocardiogram was performed. The patient's abdomen and both groins were prepped and draped in a sterile manner. A time out procedure was performed.   PERIPHERAL ACCESS:    Using the modified Seldinger technique, femoral arterial and venous access was obtained with placement of 6 Fr sheaths on the right side.  A pigtail diagnostic catheter was passed through the right arterial sheath under fluoroscopic guidance into the aortic root.  A temporary transvenous pacemaker catheter was passed through the right femoral venous sheath under fluoroscopic guidance into the right ventricle.  The pacemaker was tested to ensure stable lead placement and pacemaker capture. Aortic root angiography was performed in order to determine the optimal angiographic angle for valve deployment.   TRANSFEMORAL ACCESS:   Percutaneous transfemoral access and sheath placement was performed using ultrasound guidance.  The left common femoral artery was cannulated using a micropuncture needle and appropriate location was verified using hand injection angiogram.  A pair of Abbott Perclose percutaneous closure devices were placed and a 6  French sheath replaced into the femoral artery.  The patient was heparinized systemically and ACT verified > 250 seconds.    A 14 Fr transfemoral E-sheath was introduced into the left common femoral artery after progressively dilating over an Amplatz superstiff wire. An AL-2 catheter was used to direct a straight-tip exchange length wire across the native aortic valve into the left ventricle. This was exchanged out for a pigtail catheter and position was confirmed in the LV apex. Simultaneous LV and Ao pressures were recorded.  The pigtail catheter was exchanged for a Safari wire in the LV apex.   BALLOON AORTIC VALVULOPLASTY:   Not performed   TRANSCATHETER HEART VALVE DEPLOYMENT:   An Edwards Sapien 3 Ultra transcatheter heart valve (size 26 mm) was prepared and crimped per manufacturer's guidelines, and the proper orientation of the valve is confirmed on the [employer] delivery system. The valve was advanced through the introducer sheath using normal technique until in an appropriate position in the abdominal aorta beyond the sheath tip. The balloon was then retracted and using the fine-tuning wheel was centered on the valve. The valve was then advanced across the aortic arch using appropriate flexion of the catheter. The valve was carefully positioned across the aortic valve annulus. The Commander catheter was retracted using normal technique. Once final position of the valve has been confirmed by angiographic assessment, the valve is deployed during rapid ventricular pacing to maintain systolic blood pressure < 50 mmHg and pulse pressure < 10 mmHg. The balloon inflation is held for >3 seconds after reaching full deployment volume. Once the balloon has fully deflated the balloon is retracted into the ascending aorta and valve function is assessed using echocardiography. There is felt to be no paravalvular leak and no central aortic insufficiency.  The patient's hemodynamic recovery following  valve deployment is good.  The deployment balloon and guidewire are both removed.    PROCEDURE COMPLETION:  The sheath was removed and femoral artery closure performed.  Protamine was administered once femoral arterial repair was complete. The temporary pacemaker, pigtail catheter and femoral sheaths were removed with manual pressure used for venous hemostasis.  A Mynx femoral closure device was utilized following removal of the diagnostic sheath in the right femoral artery.  The patient tolerated the procedure well and is transported to the cath lab recovery area in stable condition. There were no immediate intraoperative complications. All sponge instrument and needle counts are verified correct at completion of the operation.   No blood products were administered during the operation.  The patient received a total of 60 mL of intravenous contrast during the procedure.   Gaye Pollack, MD 11/25/2021

## 2021-11-25 NOTE — Progress Notes (Signed)
Mobility Specialist: Progress Note   11/25/21 1801  Mobility  Activity Ambulated with assistance in hallway  Level of Assistance Contact guard assist, steadying assist  Assistive Device Four wheel walker  Distance Ambulated (ft) 180 ft  Activity Response Tolerated well  $Mobility charge 1 Mobility   Pre-Mobility: 59 HR, 94% SpO2 During Mobility: 95%SpO2 Post-Mobility: 60 HR, 93% SpO2  Pt received in the bed and agreeable to mobility. To BR per request, void successful, then hallway ambulation. Ambulated on 4 L/min [place]. Stopped x1 for brief standing break secondary to fatigue. Pt sitting EOB with call bell at his side and family present in the room.   [facility] Curtis Campos Mobility Specialist Mobility Specialist [address] [place]: [phone]

## 2021-11-25 NOTE — CV Procedure (Signed)
[facility]  TAVR OPERATIVE NOTE   Date of Procedure:  11/25/2021  Preoperative Diagnosis: Severe Aortic Stenosis   Postoperative Diagnosis: Same   Procedure:   Transcatheter Aortic Valve Replacement - Transfemoral Approach  Edwards Sapien 3 THV (size 26 mm, model # [id number], serial # [id number])   Co-Surgeons:  Lauree Chandler, MD and Gaye Pollack, MD   Anesthesiologist:  Gifford Shave  Echocardiographer:  Gasper Sells  Pre-operative Echo Findings: Severe aortic stenosis Mild left ventricular systolic dysfunction  Post-operative Echo Findings: No paravalvular leak Mild left ventricular systolic dysfunction  BRIEF CLINICAL NOTE AND INDICATIONS FOR SURGERY  81 y.o. male with a hx of COPD on home 02, former tobacco abuse, CAD, ischemic CM, HFrEF, arthritis with poor functional capacity, mitral regurgitation and severe AS who is here today for TAVR. He has had moderate aortic stenosis dating back to 2017. The patient's LVEF has been as low as 30 to 35%, then improved to 55% in 2022. LVEF has declined again, now in the 40 to 45% range.  His recent echocardiogram in February showed a mean transaortic gradient of 22 mmHg, peak gradient 36 mmHg, dimensionless index 0.24, and calculated aortic valve area of 0.81 cm as well as mild to moderate MR.    During the course of the patient's preoperative work up they have been evaluated comprehensively by a multidisciplinary team of specialists coordinated through the [facility] in the [facility].  They have been demonstrated to suffer from symptomatic severe aortic stenosis as noted above. The patient has been counseled extensively as to the relative risks and benefits of all options for the treatment of severe aortic stenosis including long term medical therapy, conventional surgery for aortic valve replacement, and transcatheter aortic valve replacement.  The patient has been  independently evaluated by Dr. Cyndia Bent with CT surgery and they are felt to be at high risk for conventional surgical aortic valve replacement. The surgeon indicated the patient would be a poor candidate for conventional surgery. Based upon review of all of the patient's preoperative diagnostic tests they are felt to be candidate for transcatheter aortic valve replacement using the transfemoral approach as an alternative to high risk conventional surgery.    Following the decision to proceed with transcatheter aortic valve replacement, a discussion has been held regarding what types of management strategies would be attempted intraoperatively in the event of life-threatening complications, including whether or not the patient would be considered a candidate for the use of cardiopulmonary bypass and/or conversion to open sternotomy for attempted surgical intervention.  The patient has been advised of a variety of complications that might develop peculiar to this approach including but not limited to risks of death, stroke, paravalvular leak, aortic dissection or other major vascular complications, aortic annulus rupture, device embolization, cardiac rupture or perforation, acute myocardial infarction, arrhythmia, heart block or bradycardia requiring permanent pacemaker placement, congestive heart failure, respiratory failure, renal failure, pneumonia, infection, other late complications related to structural valve deterioration or migration, or other complications that might ultimately cause a temporary or permanent loss of functional independence or other long term morbidity.  The patient provides full informed consent for the procedure as described and all questions were answered preoperatively.    DETAILS OF THE OPERATIVE PROCEDURE  PREPARATION:   The patient is brought to the operating room on the above mentioned date and central monitoring was established by the anesthesia team including placement of a  radial arterial line. The patient  is placed in the supine position on the operating table.  Intravenous antibiotics are administered. Conscious sedation is used.   Baseline transthoracic echocardiogram was performed. The patient's chest, abdomen, both groins, and both lower extremities are prepared and draped in a sterile manner. A time out procedure is performed.   PERIPHERAL ACCESS:   Using the modified Seldinger technique, femoral arterial and venous access were obtained with placement of a 6 Fr sheath in the artery and a 7 Fr sheath in the vein on the right side using u/s guidance.  A pigtail diagnostic catheter was passed through the femoral arterial sheath under fluoroscopic guidance into the aortic root.  A temporary transvenous pacemaker catheter was passed through the femoral venous sheath under fluoroscopic guidance into the right ventricle.  The pacemaker was tested to ensure stable lead placement and pacemaker capture. Aortic root angiography was performed in order to determine the optimal angiographic angle for valve deployment.  TRANSFEMORAL ACCESS:  A micropuncture kit was used to gain access to the left femoral artery using u/s guidance. Position confirmed with angiography. Pre-closure with double ProGlide closure devices. The patient was heparinized systemically and ACT verified > 250 seconds.    A 14 Fr transfemoral E-sheath was introduced into the left femoral artery after progressively dilating over an Amplatz superstiff wire. An AL-2 catheter was used to direct a straight-tip exchange length wire across the native aortic valve into the left ventricle. This was exchanged out for a pigtail catheter and position was confirmed in the LV apex. Simultaneous LV and Ao pressures were recorded.  The pigtail catheter was then exchanged for an Amplatz Extra-stiff wire in the LV apex.   TRANSCATHETER HEART VALVE DEPLOYMENT:  An Edwards Sapien 3 THV (size 26 mm) was prepared and crimped per  manufacturer's guidelines, and the proper orientation of the valve is confirmed on the [employer] delivery system. The valve was advanced through the introducer sheath using normal technique until in an appropriate position in the abdominal aorta beyond the sheath tip. The balloon was then retracted and using the fine-tuning wheel was centered on the valve. The valve was then advanced across the aortic arch using appropriate flexion of the catheter. The valve was carefully positioned across the aortic valve annulus. The Commander catheter was retracted using normal technique. Once final position of the valve has been confirmed by angiographic assessment, the valve is deployed while temporarily holding ventilation and during rapid ventricular pacing to maintain systolic blood pressure < 50 mmHg and pulse pressure < 10 mmHg. The balloon inflation is held for >3 seconds after reaching full deployment volume. Once the balloon has fully deflated the balloon is retracted into the ascending aorta and valve function is assessed using TTE. There is felt to be no paravalvular leak and no central aortic insufficiency.  The patient's hemodynamic recovery following valve deployment is good.  The deployment balloon and guidewire are both removed. Echo demostrated acceptable post-procedural gradients, stable mitral valve function, and no AI.   PROCEDURE COMPLETION:  The sheath was then removed and closure devices were completed. Protamine was administered once femoral arterial repair was complete. The temporary pacemaker, pigtail catheters and femoral sheaths were removed with a Mynx closure device placed in the artery and manual pressure used for venous hemostasis.    The patient tolerated the procedure well and is transported to the surgical intensive care in stable condition. There were no immediate intraoperative complications. All sponge instrument and needle counts are verified correct at completion of  the  operation.   No blood products were administered during the operation.  The patient received a total of 60 mL of intravenous contrast during the procedure.  LVEDP: 5 mmHg  Lauree Chandler MD 11/25/2021 12:49 PM

## 2021-11-25 NOTE — Progress Notes (Signed)
Patient arrived to 4E from the Cath lab. Vitals taken and stable. Patient placed on tele and CCMD notified. CHG completed. Groin sites assessed and level. Bedrest up at [place] and patient educated. Patient oriented to unit and staff. Call bell within reach. [place]

## 2021-11-25 NOTE — Transfer of Care (Signed)
Immediate Anesthesia Transfer of Care Note  Patient: Curtis Campos  Procedure(s) Performed: Transcatheter Aortic Valve Replacement, Transfemoral using a 26 MM Edwards SAPIEN 3 Ultra. (Left: Chest) INTRAOPERATIVE TRANSTHORACIC ECHOCARDIOGRAM  Patient Location: Cath Lab  Anesthesia Type:MAC  Level of Consciousness: drowsy and patient cooperative  Airway & Oxygen Therapy: Patient Spontanous Breathing and Patient connected to nasal cannula oxygen  Post-op Assessment: Report given to RN, Post -op Vital signs reviewed and stable and Patient moving all extremities  Post vital signs: Reviewed and stable  Last Vitals:  Vitals Value Taken Time  BP    Temp    Pulse    Resp    SpO2      Last Pain:  Vitals:   11/25/21 0910  TempSrc:   PainSc: 0-No pain      Patients Stated Pain Goal: 0 (15/05/69 7948)  Complications: No notable events documented.

## 2021-11-25 NOTE — Anesthesia Preprocedure Evaluation (Signed)
Anesthesia Evaluation  Patient identified by MRN, date of birth, ID band Patient awake    Reviewed: Allergy & Precautions, NPO status , Patient's Chart, lab work & pertinent test results, reviewed documented beta blocker date and time   Airway Mallampati: II  TM Distance: >3 FB Neck ROM: Full    Dental  (+) Dental Advisory Given, Missing, Edentulous Upper   Pulmonary COPD (4L O2),  COPD inhaler and oxygen dependent, former smoker,    Pulmonary exam normal breath sounds clear to auscultation       Cardiovascular hypertension, Pt. on home beta blockers + CAD, + Past MI and +CHF  + Valvular Problems/Murmurs AS and MR  Rhythm:Regular Rate:Normal + Systolic murmurs    Neuro/Psych negative neurological ROS     GI/Hepatic negative GI ROS, Neg liver ROS, H/o Rectal cancer    Endo/Other  diabetes, Type 2, Oral Hypoglycemic Agents  Renal/GU negative Renal ROS     Musculoskeletal  (+) Arthritis ,   Abdominal   Peds  Hematology negative hematology ROS (+)   Anesthesia Other Findings Day of surgery medications reviewed with the patient.  Reproductive/Obstetrics                            Anesthesia Physical Anesthesia Plan  ASA: 4  Anesthesia Plan: MAC   Post-op Pain Management: Tylenol PO (pre-op)*   Induction: Intravenous  PONV Risk Score and Plan: 1 and TIVA and Ondansetron  Airway Management Planned: Natural Airway and Simple Face Mask  Additional Equipment: Arterial line  Intra-op Plan:   Post-operative Plan:   Informed Consent: I have reviewed the patients History and Physical, chart, labs and discussed the procedure including the risks, benefits and alternatives for the proposed anesthesia with the patient or authorized representative who has indicated his/her understanding and acceptance.     Dental advisory given  Plan Discussed with: CRNA  Anesthesia Plan Comments:          Anesthesia Quick Evaluation

## 2021-11-25 NOTE — Progress Notes (Signed)
  [place] VALVE TEAM  Patient doing well s/p TAVR. He is hemodynamically stable. Groin sites stable. ECG with sinus and no high grade block. Arterial line discontinued and transferred to 4E. Plan for early ambulation after bedrest completed and hopeful discharge over the next 24-48 hours.   Angelena Form PA-C  [facility]  Pager [phone]

## 2021-11-26 ENCOUNTER — Encounter (HOSPITAL_COMMUNITY): Payer: Self-pay | Admitting: Cardiovascular Disease

## 2021-11-26 ENCOUNTER — Inpatient Hospital Stay (HOSPITAL_COMMUNITY): Payer: Medicare Other

## 2021-11-26 DIAGNOSIS — Z952 Presence of prosthetic heart valve: Secondary | ICD-10-CM

## 2021-11-26 DIAGNOSIS — Z954 Presence of other heart-valve replacement: Secondary | ICD-10-CM

## 2021-11-26 DIAGNOSIS — I350 Nonrheumatic aortic (valve) stenosis: Secondary | ICD-10-CM

## 2021-11-26 LAB — BASIC METABOLIC PANEL
Anion gap: 6 (ref 5–15)
BUN: 10 mg/dL (ref 8–23)
CO2: 26 mmol/L (ref 22–32)
Calcium: 8.5 mg/dL — ABNORMAL LOW (ref 8.9–10.3)
Chloride: 104 mmol/L (ref 98–111)
Creatinine, Ser: 0.78 mg/dL (ref 0.61–1.24)
GFR, Estimated: >60 mL/min (ref >60)
Glucose, Bld: 81 mg/dL (ref 70–99)
Potassium: 3.6 mmol/L (ref 3.5–5.1)
Sodium: 136 mmol/L (ref 135–145)

## 2021-11-26 LAB — CBC
HCT: 36.5 % — ABNORMAL LOW (ref 39.0–52.0)
Hemoglobin: 11.9 g/dL — ABNORMAL LOW (ref 13.0–17.0)
MCH: 29.8 pg (ref 26.0–34.0)
MCHC: 32.6 g/dL (ref 30.0–36.0)
MCV: 91.3 fL (ref 80.0–100.0)
Platelets: 195 10*3/uL (ref 150–400)
RBC: 4 MIL/uL — ABNORMAL LOW (ref 4.22–5.81)
RDW: 13.5 % (ref 11.5–15.5)
WBC: 8.9 10*3/uL (ref 4.0–10.5)
nRBC: 0 % (ref 0.0–0.2)

## 2021-11-26 LAB — ECHOCARDIOGRAM COMPLETE
AR max vel: 1.43 cm2
AV Area VTI: 1.49 cm2
AV Area mean vel: 1.47 cm2
AV Mean grad: 10 mmHg
AV Peak grad: 18.3 mmHg
Ao pk vel: 2.14 m/s
Calc EF: 49.2 %
Height: 67 in
S' Lateral: 2.9 cm
Single Plane A2C EF: 51.5 %
Single Plane A4C EF: 43.1 %
Weight: 2824 oz

## 2021-11-26 LAB — MAGNESIUM: Magnesium: 1.9 mg/dL (ref 1.7–2.4)

## 2021-11-26 NOTE — TOC Transition Note (Signed)
Transition of Care (TOC) - CM/SW Discharge Note Marvetta Gibbons RN, BSN Transitions of Care Unit 4E- RN Case Manager See Treatment Team for direct phone #    Patient Details  Name: Curtis Campos MRN: [id number] Date of Birth: [birth year]  Transition [facility]) CM/SW Contact:  Dawayne Patricia, RN Phone Number: 11/26/2021, 10:12 AM   Clinical Narrative:    Pt stable for transition home today s/p TAVR.  Transition of Care Department [facility]) has reviewed patient and no TOC needs have been identified at this time.  PCP-  Emily Filbert Transportation- family Medication barriers-  none identified  Final next level of care: Home/Self Care Barriers to Discharge: No Barriers Identified   Patient Goals and CMS Choice Patient states their goals for this hospitalization and ongoing recovery are:: return home   Choice offered to / list presented to : NA  Discharge Placement               Home        Discharge Plan and Services                DME Arranged: N/A DME Agency: NA       HH Arranged: NA HH Agency: NA        Social Determinants of Health (SDOH) Interventions     Readmission Risk Interventions    11/26/2021   10:12 AM  Readmission Risk Prevention Plan  Transportation Screening Complete  PCP or Specialist Appt within 5-7 Days Complete  Home Care Screening Complete  Medication Review (RN CM) Complete

## 2021-11-26 NOTE — Progress Notes (Signed)
CARDIAC REHAB PHASE I   PRE:  Rate/Rhythm: 84/NSR    BP: sitting 145/68    SaO2: 90% 4L [place]  MODE:  Ambulation: 200 ft   POST:  Rate/Rhythm: 76/NSR    BP: sitting 138/49     SaO2: 94% 4L [place] Discharge education completed with patient and daughter, educated on exercise guidelines, nutrition, fall prevention, safety, incision site care, orientation to cardiac rehab phase 11 at [facility].  Albertine Grates RN 11/26/2021 6812 am

## 2021-11-26 NOTE — Care Management Important Message (Signed)
Important Message  Patient Details  Name: Curtis Campos MRN: [id number] Date of Birth: 03/23/1941   Medicare Important Message Given:  Yes     Shelda Altes 11/26/2021, 9:33 AM

## 2021-11-26 NOTE — Progress Notes (Signed)
1 Day Post-Op Procedure(s) (LRB): Transcatheter Aortic Valve Replacement, Transfemoral using a 26 MM Edwards SAPIEN 3 Ultra. (Left) INTRAOPERATIVE TRANSTHORACIC ECHOCARDIOGRAM (N/A) Subjective: Daughter stayed with him overnight. He was a little confused at times but seems ok now. Has walked to bathroom. Echo being done now.  Objective: Vital signs in last 24 hours: Temp:  [97.5 F (36.4 C)-98.2 F (36.8 C)] 97.6 F (36.4 C) (08/30 0400) Pulse Rate:  [48-75] 64 (08/30 0400) Cardiac Rhythm: Normal sinus rhythm (08/30 0335) Resp:  [13-19] 16 (08/30 0700) BP: (93-145)/(41-70) 145/51 (08/30 0700) SpO2:  [91 %-94 %] 93 % (08/30 0400) Weight:  [80.1 kg] 80.1 kg (08/30 0410)  Hemodynamic parameters for last 24 hours:    Intake/Output from previous day: 08/29 0701 - 08/30 0700 In: 1810.8 [P.O.:490; I.V.:1220.8; IV Piggyback:100] Out: 330 [Urine:300; Blood:30] Intake/Output this shift: No intake/output data recorded.  General appearance: alert and cooperative Neurologic: intact Heart: regular rate and rhythm, S1, S2 normal, no murmur Lungs: clear to auscultation bilaterally Extremities: Wound: groin sites ok  Lab Results: Recent Labs    11/25/21 1317 11/26/21 0554  WBC  --  8.9  HGB 11.6* 11.9*  HCT 34.0* 36.5*  PLT  --  195   BMET:  Recent Labs    11/25/21 1317 11/26/21 0554  NA 138 136  K 3.7 3.6  CL 100 104  CO2  --  26  GLUCOSE 100* 81  BUN 13 10  CREATININE 0.80 0.78  CALCIUM  --  8.5*    PT/INR: No results for input(s): "LABPROT", "INR" in the last 72 hours. ABG    Component Value Date/Time   PHART 7.414 11/25/2021 1129   HCO3 29.0 (H) 11/25/2021 1129   TCO2 29 11/25/2021 1317   O2SAT 97 11/25/2021 1129   CBG (last 3)  No results for input(s): "GLUCAP" in the last 72 hours.   ECG: sinus rhythm. No high grade block Monitor strips show now bradycardia or heart block  Assessment/Plan: S/P Procedure(s) (LRB): Transcatheter Aortic Valve  Replacement, Transfemoral using a 26 MM Edwards SAPIEN 3 Ultra. (Left) INTRAOPERATIVE TRANSTHORACIC ECHOCARDIOGRAM (N/A)  POD 1 Hemodynamically stable in sinus rhythm. 2D echo in progress. Plan home today with his daughter. He is going to her house in [place].   LOS: 1 day    Gaye Pollack 11/26/2021

## 2021-11-26 NOTE — Progress Notes (Signed)
Echocardiogram 2D Echocardiogram has been performed.  Curtis Campos 11/26/2021, 8:39 AM

## 2021-11-27 ENCOUNTER — Telehealth: Payer: Self-pay | Admitting: Physician Assistant

## 2021-11-27 NOTE — Telephone Encounter (Signed)
  [place] VALVE TEAM   Patient contacted regarding discharge from [facility] on 8/30  Patient understands to follow up with a structural heart APP on 9/6 at [place].  Patient understands discharge instructions? yes Patient understands medications and regimen? yes Patient understands to bring all medications to this visit? yes  Angelena Form PA-C  [facility]

## 2021-12-02 NOTE — Progress Notes (Unsigned)
[facility] [place]                                     Cardiology Office Note:    Date:  12/03/2021   ID:  BARNET BENAVIDES, DOB 07/12/1940, MRN [id number]  PCP:  Rusty Aus, MD  [facility] Cardiologist:  Kathlyn Sacramento, MD / Dr. Angelena Form & Dr. Cyndia Bent (TAVR) [facility] Electrophysiologist:  None   Referring MD: Rusty Aus, MD   [facility] s/p TAVR  History of Present Illness:    Curtis Campos is a 81 y.o. male with a hx of COPD on home 02, former tobacco abuse, lung nodule followed at [facility], CAD, ischemic CM, HFrEF, arthritis with poor functional capacity, mitral regurgitation and severe AS s/p TAVR (11/22/21) who presents to clinic for follow up.  He has a history of moderate aortic stenosis dating back to 2017 when his ejection fraction was 30 to 35%.  His ejection fraction was improved in 2022 to 55% but then declined again to 40 to 45%.  2D echocardiogram in 05/21/21 showed EF 40-45%, and severe AS with a mean grad 22 mmHg, peak grad 39.1 mmHg, AVA 0.79 cm2, DVI 0.19, SVI 33, mild-mod MR and mild TR. He was seen by Dr. Burt Knack on 06/30/21 for TAVR evaluation. He was initially reluctant to proceed, but later called back and wanted to pursue TAVR work up and set up for [facility].    [facility] 08/27/21 showed severe single-vessel coronary artery disease with severe stenosis of the mid circumflex unchanged from the previous cath study in 2015. There was also mild nonobstructive LAD plaquing and mild ostial diagonal stenoses and mild nonobstructive mid RCA stenosis and severe distal PDA stenosis unchanged from previous cath study.. Medical therapy was planned for his CAD. Pre TAVR CTs showed a enlarging left upper pulmonary nodule. [facility] pulm ordered a PET scan and nodule is suspicious for stage I A primary bronchogenic carcinoma.   He was evaluated by the multidisciplinary valve team and underwent successful TAVR with a 26 mm Edwards Sapien 3 Ultra  Resilia THV via the TF approach on 11/25/21. Post operative echo showed EF 45%, normally functioning TAVR with a mean gradient of 10 mmHg and no PVL as well as mild to moderate MR/TR. He was continued on a baby aspirin.   Today the patient presents to clinic for follow up. Here with daughter in law. No CP. He still has some SOB., but improved from previous. Mild ankle edema that is chronic for him but no orthopnea or PND. No dizziness or syncope. No blood in stool or urine. No palpitations.    Past Medical History:  Diagnosis Date   Agent orange exposure    Arthritis 03/12/2011   CHF (congestive heart failure) (HCC)    Chronic obstructive lung disease ([place])    Coronary artery disease 03/2013   a. 03/2013 Cath: LM nl, LAD 21m, D1 50, D2 30, LCX 34m/d (chronic), OM1 50, RCA 40p, 57m, RPL 40, RPDA 60, EF 25%, mod-sev MR-->Med mgmt; b. 08/2019 MV: basal inflat/mid inflat defect consistent w/ ischemia in LCX distribution. EF 37%. Intermediate risk.   HFrEF (heart failure with reduced ejection fraction) ([place])    a. 03/2013 TEE: EF 25%; b. 08/2017 Echo: EF 45-50%, Gr1 DD. Mod AS. Mod MR. Mildly dil LA; c. 06/2019 Echo: EF 30-35%, glob HK, gr1 DD;  d. 06/2020 Echo: EF 55-60%, nl RV fxn, triv MR, Mod AS.   Hyperlipidemia    Hypertension    Ischemic cardiomyopathy    a. 03/2013 TEE: EF 25%; b. 08/2017 Echo: EF 45-50%; c. 06/2019 Echo: EF 30-35%; d. 06/2020 Echo: EF 55-60%.   Moderate aortic stenosis    a. 08/2017 Echo: Mod AS. Mean grad (S) 32mmHg. Valve area (VTI): 1.25cm^2; b. 06/2019 Echo: Mod AS. Mean grad 63mmHg, Valve area (VTI): 1.16cm^2); c. 06/2020 Mod AS. AVA 1.25cm^2, mean grad 34mmHg.   Moderate mitral regurgitation    a. 08/2017 Echo: mod MR; b. 06/2019 Echo: mild to mod MR.   Myocardial infarction [facility])    pt reports ~18 years ago   Rectal cancer ([place])    S/P TAVR (transcatheter aortic valve replacement) 11/25/2021   s/p TAVR with a 64mm Edwards S3UR via the TF approach by Dr. Angelena Form & Dr. Cyndia Bent     Past Surgical History:  Procedure Laterality Date   APPENDECTOMY  06/2003   CARDIAC CATHETERIZATION  03/2013   [facility]   cataract surgery     COLOSTOMY     HERNIA REPAIR     INTRAOPERATIVE TRANSTHORACIC ECHOCARDIOGRAM N/A 11/25/2021   Procedure: INTRAOPERATIVE TRANSTHORACIC ECHOCARDIOGRAM;  Surgeon: Burnell Blanks, MD;  Location: [place];  Service: Open Heart Surgery;  Laterality: N/A;   resection of rectum     RIGHT/LEFT HEART CATH AND CORONARY ANGIOGRAPHY N/A 08/27/2021   Procedure: RIGHT/LEFT HEART CATH AND CORONARY ANGIOGRAPHY;  Surgeon: Sherren Mocha, MD;  Location: [place] CV LAB;  Service: Cardiovascular;  Laterality: N/A;   TRANSCATHETER AORTIC VALVE REPLACEMENT, TRANSFEMORAL Left 11/25/2021   Procedure: Transcatheter Aortic Valve Replacement, Transfemoral using a 26 MM Edwards SAPIEN 3 Ultra.;  Surgeon: Burnell Blanks, MD;  Location: [place];  Service: Open Heart Surgery;  Laterality: Left;    Current Medications: Current Meds  Medication Sig   albuterol (PROVENTIL) (2.5 MG/3ML) 0.083% nebulizer solution Take 2.5 mg by nebulization every 6 (six) hours as needed for shortness of breath or wheezing.   albuterol (VENTOLIN HFA) 108 (90 Base) MCG/ACT inhaler INHALE 2 PUFFS BY MOUTH EVERY 6 HOURS AS NEEDED FOR SHORTNESS OF BREATH   Alirocumab (PRALUENT) 75 MG/ML SOAJ Inject 1 pen into the skin every 14 (fourteen) days.   amoxicillin (AMOXIL) 500 MG tablet Take 4 tablets (2,000 mg total) by mouth as directed. 1 HOUR PRIOR TO DENTAL APPOINTMENTS   aspirin 81 MG tablet Take 81 mg by mouth at bedtime.   carvedilol (COREG) 12.5 MG tablet Take 6.25 mg by mouth 2 (two) times daily.   cholecalciferol (VITAMIN D3) 25 MCG (1000 UNIT) tablet Take 1,000 Units by mouth daily.   dapagliflozin propanediol (FARXIGA) 10 MG TABS tablet Take 1 tablet (10 mg total) by mouth daily before breakfast.   diclofenac Sodium (VOLTAREN) 1 % GEL APPLY 4 GRAMS TO AFFECTED AREA FOUR TIMES A DAY AS  NEEDED FOR JOINT PAIN FOR HIP PAIN-TOTAL DAILY DOSE SHOULD NOT EXCEED 32G PER DAY OVER ALL AFFECTEDJOINTS.FOR PAIN.   ezetimibe (ZETIA) 10 MG tablet Take 1 tablet by mouth once daily for cholesterol. NEED APPOINTMENT FOR ANY MORE REFILLS   furosemide (LASIX) 20 MG tablet TAKE 1 TABLET BY MOUTH ONCE DAILY. (Patient taking differently: Take 20 mg by mouth at bedtime.)   guaiFENesin (ROBITUSSIN) 100 MG/5ML liquid Take 10 mLs by mouth every 4 (four) hours as needed for cough or to loosen phlegm.   Magnesium Oxide 400 (240 Mg) MG TABS TAKE 1  TABLET BY MOUTH ONCE DAILY.   OXYGEN Inhale 4 L into the lungs at bedtime.   sacubitril-valsartan (ENTRESTO) 49-51 MG Take 0.5 tablets by mouth 2 (two) times daily.   traZODone (DESYREL) 50 MG tablet Take 1 tablet (50 mg total) by mouth at bedtime as needed. for sleep (Patient taking differently: Take 50 mg by mouth at bedtime.)   TRELEGY ELLIPTA 100-62.5-25 MCG/ACT AEPB Inhale 1 puff into the lungs daily.     Allergies:   Codeine and Statins   Social History   Socioeconomic History   Marital status: Married    Spouse name: Barnetta Chapel   Number of children: 2   Years of education: Not on file   Highest education level: Not on file  Occupational History   Not on file  Tobacco Use   Smoking status: Former    Packs/day: 1.00    Years: 50.00    Total pack years: 50.00    Types: Cigarettes    Quit date: 03/25/2013    Years since quitting: 8.6   Smokeless tobacco: Never  Vaping Use   Vaping Use: Never used  Substance and Sexual Activity   Alcohol use: No    Alcohol/week: 0.0 standard drinks of alcohol   Drug use: No   Sexual activity: Not Currently  Other Topics Concern   Not on file  Social History Narrative   Arm '66-'68, [place], agent orange exposure.    Social Determinants of Health   Financial Resource Strain: Not on file  Food Insecurity: Not on file  Transportation Needs: Not on file  Physical Activity: Not on file  Stress: Not on  file  Social Connections: Not on file     Family History: The patient's family history includes Cancer - Cervical in his sister; Heart attack (age of onset: 60) in his brother; Heart disease in his father, mother, sister, and sister; Hyperlipidemia in his father; Hypertension in his father.  ROS:   Please see the history of present illness.    All other systems reviewed and are negative.  EKGs/Labs/Other Studies Reviewed:    The following studies were reviewed today:  TAVR OPERATIVE NOTE     Date of Procedure:                11/25/2021   Preoperative Diagnosis:      Severe Aortic Stenosis    Postoperative Diagnosis:    Same    Procedure:        Transcatheter Aortic Valve Replacement - Transfemoral Approach             Edwards Sapien 3 THV (size 26 mm, model # [id number], serial # [id number])              Co-Surgeons:                        Lauree Chandler, MD and Gaye Pollack, MD    Anesthesiologist:                  Gifford Shave   Echocardiographer:              Gasper Sells   Pre-operative Echo Findings: Severe aortic stenosis Mild left ventricular systolic dysfunction   Post-operative Echo Findings: No paravalvular leak Mild left ventricular systolic dysfunction _____________     Echo 11/26/21:  IMPRESSIONS   1. The aortic valve has been repaired/replaced. Aortic valve  regurgitation is trivial (PVL adjacent to the intraventricular septum).  There is a  26 mm Edwards Sapien prosthetic (TAVR) valve present in the  aortic position. Procedure Date: 11/25/21. Aortic  valve mean gradient measures 10.0 mmHg. Peak gradient 20 mm Hg. DVI 0.39,  EOA 1.5 cm2.   2. Left ventricular ejection fraction, by estimation, is 45 to 50%. The  left ventricle has mildly decreased function. The left ventricle  demonstrates global hypokinesis. Left ventricular diastolic parameters are  consistent with Grade I diastolic  dysfunction (impaired relaxation).   3. Right ventricular systolic  function is normal. The right ventricular  size is normal. There is mildly elevated pulmonary artery systolic  pressure. The estimated right ventricular systolic pressure is 67.2 mmHg.   4. The mitral valve is degenerative. Mild to moderate mitral valve  regurgitation. No evidence of mitral stenosis.   5. Tricuspid valve regurgitation is mild to moderate.   6. The inferior vena cava is normal in size with greater than 50%  respiratory variability, suggesting right atrial pressure of 3 mmHg.   Comparison(s): Echo findings are consistent with normal structure and  function of the aortic valve prosthesis. Mitral regurgitation improved  from operative study.   EKG:  EKG is ordered today.  The ekg ordered today demonstrates sinus brady with PVC HR 56  Recent Labs: 03/11/2021: B Natriuretic Peptide 282.5 11/21/2021: ALT 11 11/26/2021: BUN 10; Creatinine, Ser 0.78; Hemoglobin 11.9; Magnesium 1.9; Platelets 195; Potassium 3.6; Sodium 136  Recent Lipid Panel    Component Value Date/Time   CHOL 185 02/12/2020 1454   TRIG 128.0 02/12/2020 1454   HDL 53.50 02/12/2020 1454   CHOLHDL 3 02/12/2020 1454   VLDL 25.6 02/12/2020 1454   LDLCALC 106 (H) 02/12/2020 1454   LDLDIRECT 129.7 04/14/2012 0756     Risk Assessment/Calculations:       Physical Exam:    VS:  BP (!) 160/70   Pulse (!) 56   Ht 5\' 6"  (1.676 m)   Wt 174 lb (78.9 kg)   SpO2 92%   BMI 28.08 kg/m     Wt Readings from Last 3 Encounters:  12/03/21 174 lb (78.9 kg)  11/26/21 176 lb 8 oz (80.1 kg)  11/21/21 175 lb 11.2 oz (79.7 kg)     GEN:  Well nourished, well developed in no acute distress HEENT: Normal NECK: No JVD LYMPHATICS: No lymphadenopathy CARDIAC: RRR, no murmurs, rubs, gallops RESPIRATORY:  Clear to auscultation without rales, wheezing or rhonchi  ABDOMEN: Soft, non-tender, non-distended MUSCULOSKELETAL:  No edema; No deformity  SKIN: Warm and dry.  Groin sites clear without hematoma or ecchymosis   NEUROLOGIC:  Alert and oriented x 3 PSYCHIATRIC:  Normal affect   ASSESSMENT:    1. S/P TAVR (transcatheter aortic valve replacement)   2. Essential hypertension   3. Chronic obstructive pulmonary disease, unspecified COPD type ([place])   4. Ischemic cardiomyopathy   5. Pulmonary nodule    PLAN:    In order of problems listed above:  Severe AS s/p TAVR: doing well 1 week out from TAVR. ECG with no HAVB. Groin sites healing well. SBE prophylaxis discussed; I have RX'd amoxicillin. Continue on aspirin alone. I will see him back for 1 month follow up and echo.    HTN: BP 142/86 and actually more elevated on my personal recheck at 160/70. He reports a hx of white coat HTN and better pressures at home. He will keep a log of BPs at home and bring to next visit and I will adjust meds accordingly   COPD: stable  ICM/HFrEF: continue GDMT with Lindi Adie, Coreg as well as Lasix.    CAD: no chest pain. Continue Aspirin, Praulant and Zetia   Pulmonary nodule: pre TAVR CTs showed a enlarging left upper pulmonary nodule. Follow up PET scan showed probable bronchogenic carcinoma with no evidence of mets. Continue follow up at [facility].      Cardiac Rehabilitation Eligibility Assessment  The patient has declined or is not appropriate for cardiac rehabilitation.      Medication Adjustments/Labs and Tests Ordered: Current medicines are reviewed at length with the patient today.  Concerns regarding medicines are outlined above.  Orders Placed This Encounter  Procedures   EKG 12-Lead   Meds ordered this encounter  Medications   amoxicillin (AMOXIL) 500 MG tablet    Sig: Take 4 tablets (2,000 mg total) by mouth as directed. 1 HOUR PRIOR TO DENTAL APPOINTMENTS    Dispense:  12 tablet    Refill:  6    Patient Instructions  Medication Instructions:  Your physician has recommended you make the following change in your medication:  START AMOXICILLIN 500 MG - TAKE 2000 MG 1 HOUR  PRIOR TO DENTAL CLEANINGS AND PROCEDURES  *If you need a refill on your cardiac medications before your next appointment, please call your pharmacy*   Lab Work: NONE If you have labs (blood work) drawn today and your tests are completely normal, you will receive your results only by: [place] (if you have MyChart) OR A paper copy in the mail If you have any lab test that is abnormal or we need to change your treatment, we will call you to review the results.   Testing/Procedures: NONE   Follow-Up: At [facility], you and your health needs are our priority.  As part of our continuing mission to provide you with exceptional heart care, we have created designated Provider Care Teams.  These Care Teams include your primary Cardiologist (physician) and Advanced Practice Providers (APPs -  Physician Assistants and Nurse Practitioners) who all work together to provide you with the care you need, when you need it.  We recommend signing up for the patient portal called "MyChart".  Sign up information is provided on this After Visit Summary.  MyChart is used to connect with patients for Virtual Visits (Telemedicine).  Patients are able to view lab/test results, encounter notes, upcoming appointments, etc.  Non-urgent messages can be sent to your provider as well.   To learn more about what you can do with MyChart, go to [email or web address].    Your next appointment:   KEEP SCHEDULED FOLLOW-UP   Blood Pressure Record Sheet To take your blood pressure, you will need a blood pressure machine. You may be prescribed one, or you can buy a blood pressure machine (blood pressure monitor) at your clinic, drug store, or online. When choosing one, look for these features: An automatic monitor that has an arm cuff. A cuff that wraps snugly, but not too tightly, around your upper arm. You should be able to fit only one finger between your arm and the cuff. A device that stores blood  pressure reading results. Do not choose a monitor that measures your blood pressure from your wrist or finger. Follow your health care provider's instructions for how to take your blood pressure. To use this form: Get one reading in the morning (a.m.) before you take any medicines. Get one reading in the evening (p.m.) before supper. Take at least two readings with each blood pressure check.  This makes sure the results are correct. Wait 1-2 minutes between measurements. Write down the results in the spaces on this form. Repeat this once a week, or as told by your health care provider. Make a follow-up appointment with your health care provider to discuss the results. Blood pressure log Date: _______________________ a.m. _____________________(1st reading) _____________________(2nd reading) p.m. _____________________(1st reading) _____________________(2nd reading) Date: _______________________ a.m. _____________________(1st reading) _____________________(2nd reading) p.m. _____________________(1st reading) _____________________(2nd reading) Date: _______________________ a.m. _____________________(1st reading) _____________________(2nd reading) p.m. _____________________(1st reading) _____________________(2nd reading) Date: _______________________ a.m. _____________________(1st reading) _____________________(2nd reading) p.m. _____________________(1st reading) _____________________(2nd reading) Date: _______________________ a.m. _____________________(1st reading) _____________________(2nd reading) p.m. _____________________(1st reading) _____________________(2nd reading) Date: _______________________ a.m. _____________________(1st reading) _____________________(2nd reading) p.m. _____________________(1st reading) _____________________(2nd reading) Date: _______________________ a.m. _____________________(1st reading) _____________________(2nd reading) p.m. _____________________(1st reading)  _____________________(2nd reading) Date: _______________________ a.m. _____________________(1st reading) _____________________(2nd reading) p.m. _____________________(1st reading) _____________________(2nd reading) Date: _______________________ a.m. _____________________(1st reading) _____________________(2nd reading) p.m. _____________________(1st reading) _____________________(2nd reading) Date: _______________________ a.m. _____________________(1st reading) _____________________(2nd reading) p.m. _____________________(1st reading) _____________________(2nd reading) This information is not intended to replace advice given to you by your health care provider. Make sure you discuss any questions you have with your health care provider. Document Revised: 11/28/2020 Document Reviewed: 11/28/2020 Elsevier Patient Education  [place]         Signed, Angelena Form, PA-C  12/03/2021 4:05 PM    [facility]

## 2021-12-03 ENCOUNTER — Ambulatory Visit: Payer: Non-veteran care

## 2021-12-03 ENCOUNTER — Ambulatory Visit: Payer: Medicare Other | Admitting: Physician Assistant

## 2021-12-03 VITALS — BP 160/70 | HR 56 | Ht 66.0 in | Wt 174.0 lb

## 2021-12-03 DIAGNOSIS — I255 Ischemic cardiomyopathy: Secondary | ICD-10-CM

## 2021-12-03 DIAGNOSIS — R911 Solitary pulmonary nodule: Secondary | ICD-10-CM

## 2021-12-03 DIAGNOSIS — I10 Essential (primary) hypertension: Secondary | ICD-10-CM | POA: Diagnosis not present

## 2021-12-03 DIAGNOSIS — J449 Chronic obstructive pulmonary disease, unspecified: Secondary | ICD-10-CM | POA: Diagnosis not present

## 2021-12-03 DIAGNOSIS — Z952 Presence of prosthetic heart valve: Secondary | ICD-10-CM | POA: Diagnosis not present

## 2021-12-03 MED ORDER — AMOXICILLIN 500 MG PO TABS
2000.0000 mg | ORAL_TABLET | ORAL | 6 refills | Status: DC
Start: 2021-12-03 — End: 2023-03-22

## 2021-12-03 NOTE — Patient Instructions (Signed)
Medication Instructions:  Your physician has recommended you make the following change in your medication:  START AMOXICILLIN 500 MG - TAKE 2000 MG 1 HOUR PRIOR TO DENTAL CLEANINGS AND PROCEDURES  *If you need a refill on your cardiac medications before your next appointment, please call your pharmacy*   Lab Work: NONE If you have labs (blood work) drawn today and your tests are completely normal, you will receive your results only by: [place] (if you have MyChart) OR A paper copy in the mail If you have any lab test that is abnormal or we need to change your treatment, we will call you to review the results.   Testing/Procedures: NONE   Follow-Up: At [facility], you and your health needs are our priority.  As part of our continuing mission to provide you with exceptional heart care, we have created designated Provider Care Teams.  These Care Teams include your primary Cardiologist (physician) and Advanced Practice Providers (APPs -  Physician Assistants and Nurse Practitioners) who all work together to provide you with the care you need, when you need it.  We recommend signing up for the patient portal called "MyChart".  Sign up information is provided on this After Visit Summary.  MyChart is used to connect with patients for Virtual Visits (Telemedicine).  Patients are able to view lab/test results, encounter notes, upcoming appointments, etc.  Non-urgent messages can be sent to your provider as well.   To learn more about what you can do with MyChart, go to [email or web address].    Your next appointment:   KEEP SCHEDULED FOLLOW-UP   Blood Pressure Record Sheet To take your blood pressure, you will need a blood pressure machine. You may be prescribed one, or you can buy a blood pressure machine (blood pressure monitor) at your clinic, drug store, or online. When choosing one, look for these features: An automatic monitor that has an arm cuff. A cuff that wraps  snugly, but not too tightly, around your upper arm. You should be able to fit only one finger between your arm and the cuff. A device that stores blood pressure reading results. Do not choose a monitor that measures your blood pressure from your wrist or finger. Follow your health care provider's instructions for how to take your blood pressure. To use this form: Get one reading in the morning (a.m.) before you take any medicines. Get one reading in the evening (p.m.) before supper. Take at least two readings with each blood pressure check. This makes sure the results are correct. Wait 1-2 minutes between measurements. Write down the results in the spaces on this form. Repeat this once a week, or as told by your health care provider. Make a follow-up appointment with your health care provider to discuss the results. Blood pressure log Date: _______________________ a.m. _____________________(1st reading) _____________________(2nd reading) p.m. _____________________(1st reading) _____________________(2nd reading) Date: _______________________ a.m. _____________________(1st reading) _____________________(2nd reading) p.m. _____________________(1st reading) _____________________(2nd reading) Date: _______________________ a.m. _____________________(1st reading) _____________________(2nd reading) p.m. _____________________(1st reading) _____________________(2nd reading) Date: _______________________ a.m. _____________________(1st reading) _____________________(2nd reading) p.m. _____________________(1st reading) _____________________(2nd reading) Date: _______________________ a.m. _____________________(1st reading) _____________________(2nd reading) p.m. _____________________(1st reading) _____________________(2nd reading) Date: _______________________ a.m. _____________________(1st reading) _____________________(2nd reading) p.m. _____________________(1st reading) _____________________(2nd  reading) Date: _______________________ a.m. _____________________(1st reading) _____________________(2nd reading) p.m. _____________________(1st reading) _____________________(2nd reading) Date: _______________________ a.m. _____________________(1st reading) _____________________(2nd reading) p.m. _____________________(1st reading) _____________________(2nd reading) Date: _______________________ a.m. _____________________(1st reading) _____________________(2nd reading) p.m. _____________________(1st reading) _____________________(2nd reading) Date: _______________________ a.m. _____________________(1st reading) _____________________(2nd reading) p.m. _____________________(1st reading)  _____________________(2nd reading) This information is not intended to replace advice given to you by your health care provider. Make sure you discuss any questions you have with your health care provider. Document Revised: 11/28/2020 Document Reviewed: 11/28/2020 Elsevier Patient Education  [place]

## 2021-12-05 ENCOUNTER — Ambulatory Visit: Payer: Non-veteran care

## 2021-12-10 MED FILL — Magnesium Sulfate Inj 50%: INTRAMUSCULAR | Qty: 10 | Status: AC

## 2021-12-10 MED FILL — Potassium Chloride Inj 2 mEq/ML: INTRAVENOUS | Qty: 40 | Status: AC

## 2021-12-23 NOTE — Progress Notes (Signed)
HEART AND [place]                                     Cardiology Office Note:    Date:  12/26/2021   ID:  Curtis Campos, DOB 05/05/1940, MRN [id number]  PCP:  Rusty Aus, MD  [facility] Cardiologist:  Kathlyn Sacramento, MD / Dr. Angelena Form & Dr. Cyndia Bent (TAVR) [facility] Electrophysiologist:  None   Referring MD: Rusty Aus, MD   1 month s/p TAVR  History of Present Illness:    Curtis Campos is a 81 y.o. male with a hx of COPD on home 02, former tobacco abuse, lung nodule followed at [facility], CAD, ischemic CM, HFrEF, arthritis with poor functional capacity, mitral regurgitation and severe AS s/p TAVR (11/22/21) who presents to clinic for follow up.  He has a history of moderate aortic stenosis dating back to 2017 when his ejection fraction was 30 to 35%.  His ejection fraction was improved in 2022 to 55% but then declined again to 40 to 45%.  2D echocardiogram in 05/21/21 showed EF 40-45%, and severe AS with a mean grad 22 mmHg, peak grad 39.1 mmHg, AVA 0.79 cm2, DVI 0.19, SVI 33, mild-mod MR and mild TR. He was seen by Dr. Burt Knack on 06/30/21 for TAVR evaluation. He was initially reluctant to proceed, but later called back and wanted to pursue TAVR work up and set up for [facility].    [facility] 08/27/21 showed severe single-vessel coronary artery disease with severe stenosis of the mid circumflex unchanged from the previous cath study in 2015. There was also mild nonobstructive LAD plaquing and mild ostial diagonal stenoses and mild nonobstructive mid RCA stenosis and severe distal PDA stenosis unchanged from previous cath study. Medical therapy was planned for his CAD. Pre TAVR CTs showed a enlarging left upper pulmonary nodule. [facility] pulm ordered a PET scan and nodule is suspicious for stage I A primary bronchogenic carcinoma.   He was evaluated by the multidisciplinary valve team and underwent successful TAVR with a 26 mm Edwards Sapien 3  Ultra Resilia THV via the TF approach on 11/25/21. Post operative echo showed EF 45%, normally functioning TAVR with a mean gradient of 10 mmHg and no PVL as well as mild to moderate MR/TR. He was continued on a baby aspirin.   Today the patient presents to clinic for follow up. Here with his daughter. No CP or SOB. No LE edema, orthopnea or PND. No dizziness or syncope. No blood in stool or urine. No palpitations. He does have a lot of phlegm build up and a little cough.     Past Medical History:  Diagnosis Date   Agent orange exposure    Arthritis 03/12/2011   CHF (congestive heart failure) (HCC)    Chronic obstructive lung disease ([place])    Coronary artery disease 03/2013   a. 03/2013 Cath: LM nl, LAD 49m, D1 50, D2 30, LCX 40m/d (chronic), OM1 50, RCA 40p, 75m, RPL 40, RPDA 60, EF 25%, mod-sev MR-->Med mgmt; b. 08/2019 MV: basal inflat/mid inflat defect consistent w/ ischemia in LCX distribution. EF 37%. Intermediate risk.   HFrEF (heart failure with reduced ejection fraction) ([place])    a. 03/2013 TEE: EF 25%; b. 08/2017 Echo: EF 45-50%, Gr1 DD. Mod AS. Mod MR. Mildly dil LA; c. 06/2019 Echo: EF 30-35%, glob HK, gr1 DD;  d. 06/2020 Echo: EF 55-60%, nl RV fxn, triv MR, Mod AS.   Hyperlipidemia    Hypertension    Ischemic cardiomyopathy    a. 03/2013 TEE: EF 25%; b. 08/2017 Echo: EF 45-50%; c. 06/2019 Echo: EF 30-35%; d. 06/2020 Echo: EF 55-60%.   Moderate aortic stenosis    a. 08/2017 Echo: Mod AS. Mean grad (S) 2mmHg. Valve area (VTI): 1.25cm^2; b. 06/2019 Echo: Mod AS. Mean grad 24mmHg, Valve area (VTI): 1.16cm^2); c. 06/2020 Mod AS. AVA 1.25cm^2, mean grad 75mmHg.   Moderate mitral regurgitation    a. 08/2017 Echo: mod MR; b. 06/2019 Echo: mild to mod MR.   Myocardial infarction [facility])    pt reports ~18 years ago   Rectal cancer ([place])    S/P TAVR (transcatheter aortic valve replacement) 11/25/2021   s/p TAVR with a 2mm Edwards S3UR via the TF approach by Dr. Angelena Form & Dr. Cyndia Bent    Past Surgical  History:  Procedure Laterality Date   APPENDECTOMY  06/2003   CARDIAC CATHETERIZATION  03/2013   [facility]   cataract surgery     COLOSTOMY     HERNIA REPAIR     INTRAOPERATIVE TRANSTHORACIC ECHOCARDIOGRAM N/A 11/25/2021   Procedure: INTRAOPERATIVE TRANSTHORACIC ECHOCARDIOGRAM;  Surgeon: Burnell Blanks, MD;  Location: [place];  Service: Open Heart Surgery;  Laterality: N/A;   resection of rectum     RIGHT/LEFT HEART CATH AND CORONARY ANGIOGRAPHY N/A 08/27/2021   Procedure: RIGHT/LEFT HEART CATH AND CORONARY ANGIOGRAPHY;  Surgeon: Sherren Mocha, MD;  Location: [place] CV LAB;  Service: Cardiovascular;  Laterality: N/A;   TRANSCATHETER AORTIC VALVE REPLACEMENT, TRANSFEMORAL Left 11/25/2021   Procedure: Transcatheter Aortic Valve Replacement, Transfemoral using a 26 MM Edwards SAPIEN 3 Ultra.;  Surgeon: Burnell Blanks, MD;  Location: [place];  Service: Open Heart Surgery;  Laterality: Left;    Current Medications: Current Meds  Medication Sig   albuterol (PROVENTIL) (2.5 MG/3ML) 0.083% nebulizer solution Take 2.5 mg by nebulization every 6 (six) hours as needed for shortness of breath or wheezing.   albuterol (VENTOLIN HFA) 108 (90 Base) MCG/ACT inhaler INHALE 2 PUFFS BY MOUTH EVERY 6 HOURS AS NEEDED FOR SHORTNESS OF BREATH   Alirocumab (PRALUENT) 75 MG/ML SOAJ Inject 1 pen into the skin every 14 (fourteen) days.   amoxicillin (AMOXIL) 500 MG tablet Take 4 tablets (2,000 mg total) by mouth as directed. 1 HOUR PRIOR TO DENTAL APPOINTMENTS   aspirin 81 MG tablet Take 81 mg by mouth at bedtime.   carvedilol (COREG) 12.5 MG tablet Take 6.25 mg by mouth 2 (two) times daily.   cholecalciferol (VITAMIN D3) 25 MCG (1000 UNIT) tablet Take 1,000 Units by mouth daily.   dapagliflozin propanediol (FARXIGA) 10 MG TABS tablet Take 1 tablet (10 mg total) by mouth daily before breakfast.   diclofenac Sodium (VOLTAREN) 1 % GEL APPLY 4 GRAMS TO AFFECTED AREA FOUR TIMES A DAY AS NEEDED FOR JOINT  PAIN FOR HIP PAIN-TOTAL DAILY DOSE SHOULD NOT EXCEED 32G PER DAY OVER ALL AFFECTEDJOINTS.FOR PAIN.   ezetimibe (ZETIA) 10 MG tablet Take 1 tablet by mouth once daily for cholesterol. NEED APPOINTMENT FOR ANY MORE REFILLS   furosemide (LASIX) 20 MG tablet TAKE 1 TABLET BY MOUTH ONCE DAILY. (Patient taking differently: Take 20 mg by mouth at bedtime.)   guaiFENesin (ROBITUSSIN) 100 MG/5ML liquid Take 10 mLs by mouth every 4 (four) hours as needed for cough or to loosen phlegm.   Magnesium Oxide 400 (240 Mg) MG TABS TAKE 1  TABLET BY MOUTH ONCE DAILY.   OXYGEN Inhale 4 L into the lungs at bedtime.   sacubitril-valsartan (ENTRESTO) 49-51 MG Take 0.5 tablets by mouth 2 (two) times daily.   traZODone (DESYREL) 50 MG tablet Take 1 tablet (50 mg total) by mouth at bedtime as needed. for sleep (Patient taking differently: Take 50 mg by mouth at bedtime.)   TRELEGY ELLIPTA 100-62.5-25 MCG/ACT AEPB Inhale 1 puff into the lungs daily.     Allergies:   Codeine and Statins   Social History   Socioeconomic History   Marital status: Married    Spouse name: Barnetta Chapel   Number of children: 2   Years of education: Not on file   Highest education level: Not on file  Occupational History   Not on file  Tobacco Use   Smoking status: Former    Packs/day: 1.00    Years: 50.00    Total pack years: 50.00    Types: Cigarettes    Quit date: 03/25/2013    Years since quitting: 8.7   Smokeless tobacco: Never  Vaping Use   Vaping Use: Never used  Substance and Sexual Activity   Alcohol use: No    Alcohol/week: 0.0 standard drinks of alcohol   Drug use: No   Sexual activity: Not Currently  Other Topics Concern   Not on file  Social History Narrative   Arm '66-'68, [place], agent orange exposure.    Social Determinants of Health   Financial Resource Strain: Not on file  Food Insecurity: Not on file  Transportation Needs: Not on file  Physical Activity: Not on file  Stress: Not on file  Social  Connections: Not on file     Family History: The patient's family history includes Cancer - Cervical in his sister; Heart attack (age of onset: 47) in his brother; Heart disease in his father, mother, sister, and sister; Hyperlipidemia in his father; Hypertension in his father.  ROS:   Please see the history of present illness.    All other systems reviewed and are negative.  EKGs/Labs/Other Studies Reviewed:    The following studies were reviewed today:  TAVR OPERATIVE NOTE     Date of Procedure:                11/25/2021   Preoperative Diagnosis:      Severe Aortic Stenosis    Postoperative Diagnosis:    Same    Procedure:        Transcatheter Aortic Valve Replacement - Transfemoral Approach             Edwards Sapien 3 THV (size 26 mm, model # [id number], serial # [id number])              Co-Surgeons:                        Lauree Chandler, MD and Gaye Pollack, MD    Anesthesiologist:                  Gifford Shave   Echocardiographer:              Gasper Sells   Pre-operative Echo Findings: Severe aortic stenosis Mild left ventricular systolic dysfunction   Post-operative Echo Findings: No paravalvular leak Mild left ventricular systolic dysfunction _____________     Echo 11/26/21:  IMPRESSIONS   1. The aortic valve has been repaired/replaced. Aortic valve  regurgitation is trivial (PVL adjacent to the intraventricular septum).  There is a  26 mm Edwards Sapien prosthetic (TAVR) valve present in the  aortic position. Procedure Date: 11/25/21. Aortic  valve mean gradient measures 10.0 mmHg. Peak gradient 20 mm Hg. DVI 0.39,  EOA 1.5 cm2.   2. Left ventricular ejection fraction, by estimation, is 45 to 50%. The  left ventricle has mildly decreased function. The left ventricle  demonstrates global hypokinesis. Left ventricular diastolic parameters are  consistent with Grade I diastolic  dysfunction (impaired relaxation).   3. Right ventricular systolic function is  normal. The right ventricular  size is normal. There is mildly elevated pulmonary artery systolic  pressure. The estimated right ventricular systolic pressure is 61.4 mmHg.   4. The mitral valve is degenerative. Mild to moderate mitral valve  regurgitation. No evidence of mitral stenosis.   5. Tricuspid valve regurgitation is mild to moderate.   6. The inferior vena cava is normal in size with greater than 50%  respiratory variability, suggesting right atrial pressure of 3 mmHg.   Comparison(s): Echo findings are consistent with normal structure and  function of the aortic valve prosthesis. Mitral regurgitation improved  from operative study.   _________________________  Echo 12/24/21 IMPRESSIONS   1. Left ventricular ejection fraction, by estimation, is 50%. The left  ventricle has low normal function. The left ventricle has no regional wall  motion abnormalities. There is mild left ventricular hypertrophy. Left  ventricular diastolic parameters are   consistent with Grade I diastolic dysfunction (impaired relaxation).   2. Right ventricular systolic function is normal. The right ventricular  size is normal. There is mildly elevated pulmonary artery systolic  pressure. The estimated right ventricular systolic pressure is 43.1 mmHg.   3. The mitral valve is degenerative. Mild to moderate mitral valve  regurgitation. No evidence of mitral stenosis.   4. The aortic valve has been repaired/replaced. Aortic valve  regurgitation is trivial, paravalvular and anterior. There is a 26 mm  Sapien prosthetic (TAVR) valve present in the aortic position. Procedure  Date: 11/22/2021. Echo findings are consistent  with normal structure and function of the aortic valve prosthesis. Aortic  valve mean gradient measures 8.0 mmHg.   5. The inferior vena cava is normal in size with greater than 50%  respiratory variability, suggesting right atrial pressure of 3 mmHg.   EKG:  EKG is NOT ordered today.     Recent Labs: 03/11/2021: B Natriuretic Peptide 282.5 11/21/2021: ALT 11 11/26/2021: BUN 10; Creatinine, Ser 0.78; Hemoglobin 11.9; Magnesium 1.9; Platelets 195; Potassium 3.6; Sodium 136  Recent Lipid Panel    Component Value Date/Time   CHOL 185 02/12/2020 1454   TRIG 128.0 02/12/2020 1454   HDL 53.50 02/12/2020 1454   CHOLHDL 3 02/12/2020 1454   VLDL 25.6 02/12/2020 1454   LDLCALC 106 (H) 02/12/2020 1454   LDLDIRECT 129.7 04/14/2012 0756     Risk Assessment/Calculations:       Physical Exam:    VS:  BP 130/72   Pulse 63   Ht 5\' 6"  (1.676 m)   Wt 174 lb (78.9 kg)   SpO2 92%   BMI 28.08 kg/m     Wt Readings from Last 3 Encounters:  12/24/21 174 lb (78.9 kg)  12/03/21 174 lb (78.9 kg)  11/26/21 176 lb 8 oz (80.1 kg)     GEN:  Well nourished, well developed in no acute distress HEENT: Normal NECK: No JVD LYMPHATICS: No lymphadenopathy CARDIAC: RRR, no murmurs, rubs, gallops RESPIRATORY:  Clear to auscultation without rales, wheezing or rhonchi  ABDOMEN:  Soft, non-tender, non-distended MUSCULOSKELETAL:  No edema; No deformity  SKIN: Warm and dry. NEUROLOGIC:  Alert and oriented x 3 PSYCHIATRIC:  Normal affect   ASSESSMENT:    1. S/P TAVR (transcatheter aortic valve replacement)   2. Essential hypertension   3. Chronic obstructive pulmonary disease, unspecified COPD type ([place])   4. Ischemic cardiomyopathy   5. Pulmonary nodule     PLAN:    In order of problems listed above:  Severe AS s/p TAVR: echo today shows EF 50%, normally functioning TAVR with a mean gradient of 8 mm hg and no PVL as well as mild to mod MR and mild pulm HTN. He has NYHA class II symptoms. SBE prophylaxis discussed; I have RX'd amoxicillin. Continue on aspirin alone. I will see him back for 1 year follow up and echo.    HTN: BP is well controlled. No changes made.    COPD: stable. Has some increase phlegm and cough. Asked him to reach out to pulmonologist.    ICM/HFrEF: EF 50%.  Continue GDMT with Lindi Adie, Coreg as well as Lasix.    CAD: no chest pain. Continue Aspirin, Praulant and Zetia   Pulmonary nodule: pre TAVR CTs showed a enlarging left upper pulmonary nodule. Follow up PET scan showed probable bronchogenic carcinoma with no evidence of mets. Continue follow up at [facility].    Medication Adjustments/Labs and Tests Ordered: Current medicines are reviewed at length with the patient today.  Concerns regarding medicines are outlined above.  Orders Placed This Encounter  Procedures   ECHOCARDIOGRAM COMPLETE   No orders of the defined types were placed in this encounter.   Patient Instructions  Medication Instructions:  Your physician recommends that you continue on your current medications as directed. Please refer to the Current Medication list given to you today.   *If you need a refill on your cardiac medications before your next appointment, please call your pharmacy*   Lab Work: None ordered   If you have labs (blood work) drawn today and your tests are completely normal, you will receive your results only by: [place] (if you have MyChart) OR A paper copy in the mail If you have any lab test that is abnormal or we need to change your treatment, we will call you to review the results.   Testing/Procedures: Your physician has requested that you have an echocardiogram in August 2024 Echocardiography is a painless test that uses sound waves to create images of your heart. It provides your doctor with information about the size and shape of your heart and how well your heart's chambers and valves are working. This procedure takes approximately one hour. There are no restrictions for this procedure.    Follow-Up: Follow up as scheduled   Other Instructions   Important Information About Sugar         Signed, Angelena Form, PA-C  12/26/2021 9:47 AM    [place]

## 2021-12-24 ENCOUNTER — Ambulatory Visit (HOSPITAL_BASED_OUTPATIENT_CLINIC_OR_DEPARTMENT_OTHER): Payer: Medicare Other

## 2021-12-24 ENCOUNTER — Ambulatory Visit: Payer: Medicare Other | Attending: Internal Medicine | Admitting: Physician Assistant

## 2021-12-24 VITALS — BP 130/72 | HR 63 | Ht 66.0 in | Wt 174.0 lb

## 2021-12-24 DIAGNOSIS — Z952 Presence of prosthetic heart valve: Secondary | ICD-10-CM

## 2021-12-24 DIAGNOSIS — I255 Ischemic cardiomyopathy: Secondary | ICD-10-CM | POA: Diagnosis not present

## 2021-12-24 DIAGNOSIS — J449 Chronic obstructive pulmonary disease, unspecified: Secondary | ICD-10-CM | POA: Diagnosis not present

## 2021-12-24 DIAGNOSIS — I10 Essential (primary) hypertension: Secondary | ICD-10-CM | POA: Insufficient documentation

## 2021-12-24 DIAGNOSIS — R911 Solitary pulmonary nodule: Secondary | ICD-10-CM | POA: Diagnosis present

## 2021-12-24 NOTE — Patient Instructions (Signed)
Medication Instructions:  Your physician recommends that you continue on your current medications as directed. Please refer to the Current Medication list given to you today.   *If you need a refill on your cardiac medications before your next appointment, please call your pharmacy*   Lab Work: None ordered   If you have labs (blood work) drawn today and your tests are completely normal, you will receive your results only by: [place] (if you have MyChart) OR A paper copy in the mail If you have any lab test that is abnormal or we need to change your treatment, we will call you to review the results.   Testing/Procedures: Your physician has requested that you have an echocardiogram in August 2024 Echocardiography is a painless test that uses sound waves to create images of your heart. It provides your doctor with information about the size and shape of your heart and how well your heart's chambers and valves are working. This procedure takes approximately one hour. There are no restrictions for this procedure.    Follow-Up: Follow up as scheduled   Other Instructions   Important Information About Sugar

## 2021-12-26 LAB — ECHOCARDIOGRAM COMPLETE
AR max vel: 1.65 cm2
AV Area VTI: 1.82 cm2
AV Area mean vel: 1.66 cm2
AV Mean grad: 8 mmHg
AV Peak grad: 16.8 mmHg
Ao pk vel: 2.05 m/s
Area-P 1/2: 2.5 cm2
MV M vel: 5.35 m/s
MV Peak grad: 114.5 mmHg
P 1/2 time: 2005 msec
Radius: 0.6 cm
S' Lateral: 3.9 cm

## 2022-01-26 ENCOUNTER — Other Ambulatory Visit: Payer: Self-pay | Admitting: Specialist

## 2022-01-26 DIAGNOSIS — R911 Solitary pulmonary nodule: Secondary | ICD-10-CM

## 2022-02-09 ENCOUNTER — Other Ambulatory Visit: Payer: Self-pay | Admitting: Cardiovascular Disease

## 2022-04-06 ENCOUNTER — Institutional Professional Consult (permissible substitution): Payer: No Typology Code available for payment source | Admitting: Radiation Oncology

## 2022-04-10 ENCOUNTER — Other Ambulatory Visit: Payer: Self-pay

## 2022-04-10 ENCOUNTER — Telehealth: Payer: Self-pay | Admitting: Cardiovascular Disease

## 2022-04-10 MED ORDER — FUROSEMIDE 20 MG PO TABS: 20.0000 mg | ORAL_TABLET | Freq: Every day | ORAL | 2 refills | Status: DC

## 2022-04-10 MED ORDER — DAPAGLIFLOZIN PROPANEDIOL 10 MG PO TABS: 10.0000 mg | ORAL_TABLET | Freq: Every day | ORAL | 3 refills | Status: DC

## 2022-04-10 MED ORDER — FUROSEMIDE 20 MG PO TABS: 20.0000 mg | ORAL_TABLET | Freq: Every day | ORAL | 3 refills | Status: DC

## 2022-04-10 NOTE — Telephone Encounter (Signed)
*  STAT* If patient is at the pharmacy, call can be transferred to refill team.   1. Which medications need to be refilled? (please list name of each medication and dose if known) furosemide (LASIX) 20 MG tablet   2. Which pharmacy/location (including street and city if local pharmacy) is medication to be sent to? [facility] - [place], [place] - [address]   3. Do they need a 30 day or 90 day supply? 90

## 2022-04-10 NOTE — Telephone Encounter (Signed)
Prescription sent to pharmacy.

## 2022-04-10 NOTE — Telephone Encounter (Signed)
Patient's wife called and said that patient need the medication dapagliflozin propanediol (FARXIGA) 10 MG TABS tablet sent into the [facility] pharmacy so that they can get it at a cheaper price

## 2022-04-17 ENCOUNTER — Ambulatory Visit: Payer: Non-veteran care | Admitting: Cardiovascular Disease

## 2022-05-05 ENCOUNTER — Telehealth: Payer: Self-pay | Admitting: Cardiovascular Disease

## 2022-05-05 NOTE — Telephone Encounter (Signed)
Pt c/o medication issue:  1. Name of Medication: dapagliflozin propanediol (FARXIGA) 10 MG TABS tablet   2. How are you currently taking this medication (dosage and times per day)?   3. Are you having a reaction (difficulty breathing--STAT)?   4. What is your medication issue? Pt wife called in stating this medication is still too expensive, please advise.

## 2022-05-05 NOTE — Telephone Encounter (Signed)
Called and spoke with the husband and wife. She was calling to say that they needed the [place] sent into the [place]. She has been advised that this was sent in last month to the [place]. She will call tomorrow and see if they have the medication.

## 2022-05-07 ENCOUNTER — Encounter: Payer: Self-pay | Admitting: Cardiovascular Disease

## 2022-05-07 ENCOUNTER — Ambulatory Visit: Payer: Medicare Other | Attending: Cardiovascular Disease | Admitting: Cardiovascular Disease

## 2022-05-07 VITALS — BP 104/60 | HR 64 | Ht 66.0 in | Wt 180.0 lb

## 2022-05-07 DIAGNOSIS — I2510 Atherosclerotic heart disease of native coronary artery without angina pectoris: Secondary | ICD-10-CM | POA: Diagnosis not present

## 2022-05-07 DIAGNOSIS — I5022 Chronic systolic (congestive) heart failure: Secondary | ICD-10-CM | POA: Diagnosis not present

## 2022-05-07 DIAGNOSIS — I10 Essential (primary) hypertension: Secondary | ICD-10-CM | POA: Diagnosis not present

## 2022-05-07 DIAGNOSIS — Z952 Presence of prosthetic heart valve: Secondary | ICD-10-CM | POA: Diagnosis not present

## 2022-05-07 NOTE — Patient Instructions (Addendum)
Medication Instructions:  Your Physician recommend you continue on your current medication as directed.    *If you need a refill on your cardiac medications before your next appointment, please call your pharmacy*   Lab Work: None ordered today   Testing/Procedures: None ordered today   Follow-Up: At [facility], you and your health needs are our priority.  As part of our continuing mission to provide you with exceptional heart care, we have created designated Provider Care Teams.  These Care Teams include your primary Cardiologist (physician) and Advanced Practice Providers (APPs -  Physician Assistants and Nurse Practitioners) who all work together to provide you with the care you need, when you need it.  We recommend signing up for the patient portal called "MyChart".  Sign up information is provided on this After Visit Summary.  MyChart is used to connect with patients for Virtual Visits (Telemedicine).  Patients are able to view lab/test results, encounter notes, upcoming appointments, etc.  Non-urgent messages can be sent to your provider as well.   To learn more about what you can do with MyChart, go to [email or web address].    Your next appointment:   6 month(s)  Provider:   You may see Kathlyn Sacramento, MD or one of the following Advanced Practice Providers on your designated Care Team:   Murray Hodgkins, NP Christell Faith, PA-C Cadence Kathlen Mody, PA-C Gerrie Nordmann, NP

## 2022-05-07 NOTE — Progress Notes (Signed)
Cardiology Office Note   Date:  05/07/2022   ID:  [name], [name] 21-Jul-1940, MRN [id number]  PCP:  Rusty Aus, MD  Cardiologist:   Kathlyn Sacramento, MD   Chief Complaint  Patient presents with   Follow-up       History of Present Illness: Curtis Campos is a 82 y.o. male who presents for a follow-up visit regarding chronic systolic heart failure, coronary artery disease, and aortic stenosis status post TAVR in August 2023.   He has known history of hyperlipidemia with intolerance to statins, hypertension, previous rectal cancer, previous tobacco use and COPD.  Echocardiogram in 2015 showed an EF of 25% with moderate mitral regurgitation A right and left cardiac catheterization showed severe one-vessel coronary artery disease with a subtotal occlusion of the mid left circumflex which appeared to be chronic.   He had worsening heart failure in 2021.  Echocardiogram in April 21showed an EF of 30 to 35%, moderate aortic stenosis, mild to moderate mitral regurgitation and moderate pulmonary hypertension.  Lexiscan Myoview showed evidence of ischemia in the left circumflex distribution.  The dose of furosemide was increased to 40 mg daily, losartan was switched to Entresto and small dose spironolactone was added. He had improvement in heart failure symptoms and repeat echocardiogram done in April of 2022 showed an EF of 55 to 60% with moderate aortic stenosis.  Calculated aortic valve area was 1.25 cm.  Spironolactone was subsequently discontinued due to hypotension. He had progression of aortic stenosis to the severe range last year.  Cardiac catheterization showed no change in coronary anatomy and subsequently underwent TAVR in August 2023.  Most recent echocardiogram in September showed an EF of 50%, mild pulmonary hypertension, mild to moderate mitral regurgitation and normal functioning TAVR prosthesis with mean gradient of 8 mmHg and trivial aortic insufficiency.  He has  been doing reasonably well with no chest pain.  He has chronic exertional dyspnea.  His balance is not great and he walks with a cane and a walker.  Past Medical History:  Diagnosis Date   Agent orange exposure    Arthritis 03/12/2011   CHF (congestive heart failure) (HCC)    Chronic obstructive lung disease ([place])    Coronary artery disease 03/2013   a. 03/2013 Cath: LM nl, LAD 1m, D1 50, D2 30, LCX 19m/d (chronic), OM1 50, RCA 40p, 41m, RPL 40, RPDA 60, EF 25%, mod-sev MR-->Med mgmt; b. 08/2019 MV: basal inflat/mid inflat defect consistent w/ ischemia in LCX distribution. EF 37%. Intermediate risk.   HFrEF (heart failure with reduced ejection fraction) ([place])    a. 03/2013 TEE: EF 25%; b. 08/2017 Echo: EF 45-50%, Gr1 DD. Mod AS. Mod MR. Mildly dil LA; c. 06/2019 Echo: EF 30-35%, glob HK, gr1 DD; d. 06/2020 Echo: EF 55-60%, nl RV fxn, triv MR, Mod AS.   Hyperlipidemia    Hypertension    Ischemic cardiomyopathy    a. 03/2013 TEE: EF 25%; b. 08/2017 Echo: EF 45-50%; c. 06/2019 Echo: EF 30-35%; d. 06/2020 Echo: EF 55-60%.   Moderate aortic stenosis    a. 08/2017 Echo: Mod AS. Mean grad (S) 80mmHg. Valve area (VTI): 1.25cm^2; b. 06/2019 Echo: Mod AS. Mean grad 68mmHg, Valve area (VTI): 1.16cm^2); c. 06/2020 Mod AS. AVA 1.25cm^2, mean grad 39mmHg.   Moderate mitral regurgitation    a. 08/2017 Echo: mod MR; b. 06/2019 Echo: mild to mod MR.   Myocardial infarction [facility])    pt reports ~18 years ago  Rectal cancer (HCC)    S/P TAVR (transcatheter aortic valve replacement) 11/25/2021   s/p TAVR with a 65mm Edwards S3UR via the TF approach by Dr. Angelena Form & Dr. Cyndia Bent    Past Surgical History:  Procedure Laterality Date   APPENDECTOMY  06/2003   CARDIAC CATHETERIZATION  03/2013   [facility]   cataract surgery     COLOSTOMY     HERNIA REPAIR     INTRAOPERATIVE TRANSTHORACIC ECHOCARDIOGRAM N/A 11/25/2021   Procedure: INTRAOPERATIVE TRANSTHORACIC ECHOCARDIOGRAM;  Surgeon: Burnell Blanks, MD;  Location: [place];  Service: Open Heart Surgery;  Laterality: N/A;   resection of rectum     RIGHT/LEFT HEART CATH AND CORONARY ANGIOGRAPHY N/A 08/27/2021   Procedure: RIGHT/LEFT HEART CATH AND CORONARY ANGIOGRAPHY;  Surgeon: Sherren Mocha, MD;  Location: [place] CV LAB;  Service: Cardiovascular;  Laterality: N/A;   TRANSCATHETER AORTIC VALVE REPLACEMENT, TRANSFEMORAL Left 11/25/2021   Procedure: Transcatheter Aortic Valve Replacement, Transfemoral using a 26 MM Edwards SAPIEN 3 Ultra.;  Surgeon: Burnell Blanks, MD;  Location: [place];  Service: Open Heart Surgery;  Laterality: Left;     Current Outpatient Medications  Medication Sig Dispense Refill   albuterol (PROVENTIL) (2.5 MG/3ML) 0.083% nebulizer solution Take 2.5 mg by nebulization every 6 (six) hours as needed for shortness of breath or wheezing.     albuterol (VENTOLIN HFA) 108 (90 Base) MCG/ACT inhaler INHALE 2 PUFFS BY MOUTH EVERY 6 HOURS AS NEEDED FOR SHORTNESS OF BREATH 18 g 0   Alirocumab (PRALUENT) 75 MG/ML SOAJ Inject 1 pen into the skin every 14 (fourteen) days. 2 pen 11   amoxicillin (AMOXIL) 500 MG tablet Take 4 tablets (2,000 mg total) by mouth as directed. 1 HOUR PRIOR TO DENTAL APPOINTMENTS 12 tablet 6   aspirin 81 MG tablet Take 81 mg by mouth at bedtime.     carvedilol (COREG) 12.5 MG tablet Take 6.25 mg by mouth 2 (two) times daily.     cholecalciferol (VITAMIN D3) 25 MCG (1000 UNIT) tablet Take 1,000 Units by mouth daily.     dapagliflozin propanediol (FARXIGA) 10 MG TABS tablet Take 1 tablet (10 mg total) by mouth daily before breakfast. 90 tablet 3   diclofenac Sodium (VOLTAREN) 1 % GEL APPLY 4 GRAMS TO AFFECTED AREA FOUR TIMES A DAY AS NEEDED FOR JOINT PAIN FOR HIP PAIN-TOTAL DAILY DOSE SHOULD NOT EXCEED 32G PER DAY OVER ALL AFFECTEDJOINTS.FOR PAIN.     ezetimibe (ZETIA) 10 MG tablet Take 1 tablet by mouth once daily for cholesterol. NEED APPOINTMENT FOR ANY MORE REFILLS 90 tablet 1   furosemide (LASIX) 20 MG tablet Take  1 tablet (20 mg total) by mouth daily. 90 tablet 3   guaiFENesin (ROBITUSSIN) 100 MG/5ML liquid Take 10 mLs by mouth every 4 (four) hours as needed for cough or to loosen phlegm. 120 mL 0   Magnesium Oxide 400 (240 Mg) MG TABS TAKE 1 TABLET BY MOUTH ONCE DAILY. 90 tablet 3   OXYGEN Inhale 4 L into the lungs at bedtime.     sacubitril-valsartan (ENTRESTO) 49-51 MG Take 1 tablet by mouth 2 (two) times daily.     traZODone (DESYREL) 50 MG tablet Take 1 tablet (50 mg total) by mouth at bedtime as needed. for sleep 90 tablet 0   TRELEGY ELLIPTA 100-62.5-25 MCG/ACT AEPB Inhale 1 puff into the lungs daily.     No current facility-administered medications for this visit.    Allergies:   Codeine and Statins  Social History:  The patient  reports that he quit smoking about 9 years ago. His smoking use included cigarettes. He has a 50.00 pack-year smoking history. He has never used smokeless tobacco. He reports that he does not drink alcohol and does not use drugs.   Family History:  The patient's family history includes Cancer - Cervical in his sister; Heart attack (age of onset: 66) in his brother; Heart disease in his father, mother, sister, and sister; Hyperlipidemia in his father; Hypertension in his father.    ROS:  Please see the history of present illness.   Otherwise, review of systems are positive for none.   All other systems are reviewed and negative.    PHYSICAL EXAM: VS:  BP 104/60 (BP Location: Left Arm, Patient Position: Sitting, Cuff Size: Large)   Pulse 64   Ht 5\' 6"  (1.676 m)   Wt 180 lb (81.6 kg)   SpO2 93%   BMI 29.05 kg/m  , BMI Body mass index is 29.05 kg/m. GEN: Well nourished, well developed, in no acute distress  HEENT: normal  Neck: no JVD, carotid bruits, or masses Cardiac: RRR with premature beats; rubs, or gallops,no edema .  1 out of 6 systolic murmur in the aortic area.  There is trace bilateral leg edema Respiratory:  clear to auscultation bilaterally,  normal work of breathing GI: soft, nontender, nondistended, + BS MS: no deformity or atrophy  Skin: warm and dry, no rash Neuro:  Strength and sensation are intact Psych: euthymic mood, full affect   EKG:  EKG is  ordered today. EKG showed sinus rhythm with sinus arrhythmia.   Recent Labs: 11/21/2021: ALT 11 11/26/2021: BUN 10; Creatinine, Ser 0.78; Hemoglobin 11.9; Magnesium 1.9; Platelets 195; Potassium 3.6; Sodium 136    Lipid Panel    Component Value Date/Time   CHOL 185 02/12/2020 1454   TRIG 128.0 02/12/2020 1454   HDL 53.50 02/12/2020 1454   CHOLHDL 3 02/12/2020 1454   VLDL 25.6 02/12/2020 1454   LDLCALC 106 (H) 02/12/2020 1454   LDLDIRECT 129.7 04/14/2012 0756      Wt Readings from Last 3 Encounters:  05/07/22 180 lb (81.6 kg)  12/24/21 174 lb (78.9 kg)  12/03/21 174 lb (78.9 kg)         ASSESSMENT AND PLAN:  1. Chronic systolic heart failure: Most recent echocardiogram  showed an EF of 50%. He appears to be euvolemic on torsemide 20 mg once daily.  Continue Entresto, carvedilol and [place].  Spironolactone was discontinued due to hypotension.   2. Coronary artery disease involving native coronary arteries without angina: He has known chronic subtotal occlusion of the mid left circumflex.  Currently with no anginal symptoms.  Continue medical therapy.   3. Essential hypertension: Blood pressure is well controlled.   4. Hyperlipidemia: He has history of intolerance to statins and currently is on Praluent and Zetia.    5.  Status post TAVR for severe aortic stenosis.  Most recent echocardiogram in September showed normal functioning TAVR prosthesis.   Disposition: Follow-up in 6 months.  Signed,  Kathlyn Sacramento, MD  05/07/2022 2:45 PM    [facility]

## 2022-06-02 ENCOUNTER — Telehealth: Payer: Self-pay | Admitting: Cardiovascular Disease

## 2022-06-02 NOTE — Telephone Encounter (Signed)
Pt c/o medication issue:  1. Name of Medication: dapagliflozin propanediol (FARXIGA) 10 MG TABS tablet   2. How are you currently taking this medication (dosage and times per day)? Take 1 tablet (10 mg total) by mouth daily before breakfast.   3. Are you having a reaction (difficulty breathing--STAT)? No  4. What is your medication issue? Pt states that his [place] provider changed the dosage as well as the instructions of above medication to 25 mg and take half a tablet daily. Pt would like a callback to ensure that this is okay. Please advise.

## 2022-06-02 NOTE — Telephone Encounter (Signed)
The patient was calling to let Dr. Fletcher Anon know that the [facility] changed him to Jardiance 12.5 mg once daily instead of the [place]. The [place] was too much and the patient gets the Jardiance at no cost.

## 2022-06-04 MED ORDER — DAPAGLIFLOZIN PROPANEDIOL 10 MG PO TABS: 10.0000 mg | ORAL_TABLET | Freq: Every day | ORAL | 3 refills | Status: DC

## 2022-06-04 NOTE — Telephone Encounter (Signed)
Pt c/o medication issue:  1. Name of Medication:   dapagliflozin propanediol (FARXIGA) 10 MG TABS tablet   2. How are you currently taking this medication (dosage and times per day)? Had been taking this previously as prescribed  3. Are you having a reaction (difficulty breathing--STAT)?   No  4. What is your medication issue?    Wife called stating patient cannot take Jardiance as that medication makes him bruise easily.  Patient stated she wants to pick this medication up from [place], [place] today.

## 2022-06-04 NOTE — Telephone Encounter (Signed)
I am fine if we switch him back to Farxiga 10 mg daily.

## 2022-06-04 NOTE — Telephone Encounter (Signed)
Patient's spouse states that they want to return to taking dapagliflozin propanediol (FARXIGA) 10 MG TABS tablet because the empagliflozin (JARDIANCE) 25 MG TABS tablet is making him bruise easily. Please advise

## 2022-06-04 NOTE — Telephone Encounter (Signed)
Spoke to the patient and his wife. Vania Rea has been cancelled.  Farxiga 10 mg once daily has been sent in for them.

## 2022-07-24 ENCOUNTER — Other Ambulatory Visit: Payer: Self-pay | Admitting: Cardiovascular Disease

## 2022-07-24 ENCOUNTER — Telehealth: Payer: Self-pay | Admitting: Cardiovascular Disease

## 2022-07-24 NOTE — Telephone Encounter (Signed)
*  STAT* If patient is at the pharmacy, call can be transferred to refill team.   1. Which medications need to be refilled? (please list name of each medication and dose if known)   carvedilol (COREG) 12.5 MG tablet    2. Which pharmacy/location (including street and city if local pharmacy) is medication to be sent to? [facility] - [place], [place] - [address]    3. Do they need a 30 day or 90 day supply? 90    Pt states he is completely out

## 2022-07-24 NOTE — Telephone Encounter (Signed)
carvedilol (COREG) 12.5 MG tablet 180 tablet 3 07/24/2022    Sig - Route: TAKE 1 TABLET BY MOUTH TWICE DAILY - Oral   Sent to pharmacy as: carvedilol (COREG) 12.5 MG tablet   E-Prescribing Status: Receipt confirmed by pharmacy (07/24/2022 11:21 AM EDT)    Pharmacy  [address] [facility] - [place], [place] - [address]

## 2022-09-28 ENCOUNTER — Telehealth: Payer: Self-pay | Admitting: Cardiovascular Disease

## 2022-09-28 NOTE — Telephone Encounter (Signed)
Patient states he has noticed increased swelling in his ankles. Patient states his weight fluctuates within 2 lbs everyday. Patient reports being SOB intermittently but its not affecting his ADL. Patient states he took an extra dose of lasix last night and it helped. Patient also reports a cough. Patient advised to not drink more than 2L per day and to elevate his feet. Patient has office visit scheduled for 7/8.

## 2022-09-28 NOTE — Telephone Encounter (Signed)
Increase furosemide to 40 mg once daily.

## 2022-09-28 NOTE — Telephone Encounter (Signed)
Wife calling in to see if patient can increase his fluid pills, because he has some swelling in ankles. Please advise    Schedule patient for 7/8

## 2022-09-29 ENCOUNTER — Other Ambulatory Visit: Payer: Self-pay | Admitting: Cardiovascular Disease

## 2022-09-29 MED ORDER — FUROSEMIDE 20 MG PO TABS: 40.0000 mg | ORAL_TABLET | Freq: Every day | ORAL | Status: DC

## 2022-09-29 NOTE — Telephone Encounter (Signed)
Patient advised to take 40 mg of lasix daily as recommended by Dr. Kirke Corin. Patient verbalized understanding and no further questions at this time.  Lasix sig updated to reflect changes.

## 2022-10-05 ENCOUNTER — Ambulatory Visit: Payer: Medicare Other | Attending: Medical | Admitting: Medical

## 2022-10-05 ENCOUNTER — Ambulatory Visit
Admission: RE | Admit: 2022-10-05 | Discharge: 2022-10-05 | Disposition: A | Discharge status: Home / Self Care | Payer: Medicare Other | Source: Ambulatory Visit | Attending: Medical | Admitting: Medical

## 2022-10-05 ENCOUNTER — Encounter: Payer: Self-pay | Admitting: Medical

## 2022-10-05 ENCOUNTER — Other Ambulatory Visit
Admission: RE | Admit: 2022-10-05 | Discharge: 2022-10-05 | Disposition: A | Discharge status: Home / Self Care | Payer: Medicare Other | Source: Ambulatory Visit | Attending: Medical | Admitting: Medical

## 2022-10-05 VITALS — BP 98/60 | HR 68 | Ht 66.0 in | Wt 175.1 lb

## 2022-10-05 DIAGNOSIS — I350 Nonrheumatic aortic (valve) stenosis: Secondary | ICD-10-CM | POA: Insufficient documentation

## 2022-10-05 DIAGNOSIS — Z952 Presence of prosthetic heart valve: Secondary | ICD-10-CM | POA: Insufficient documentation

## 2022-10-05 DIAGNOSIS — I10 Essential (primary) hypertension: Secondary | ICD-10-CM | POA: Diagnosis not present

## 2022-10-05 DIAGNOSIS — I5022 Chronic systolic (congestive) heart failure: Secondary | ICD-10-CM | POA: Diagnosis not present

## 2022-10-05 DIAGNOSIS — J449 Chronic obstructive pulmonary disease, unspecified: Secondary | ICD-10-CM | POA: Insufficient documentation

## 2022-10-05 DIAGNOSIS — E785 Hyperlipidemia, unspecified: Secondary | ICD-10-CM | POA: Diagnosis not present

## 2022-10-05 DIAGNOSIS — Z79899 Other long term (current) drug therapy: Secondary | ICD-10-CM

## 2022-10-05 DIAGNOSIS — R051 Acute cough: Secondary | ICD-10-CM

## 2022-10-05 DIAGNOSIS — R918 Other nonspecific abnormal finding of lung field: Secondary | ICD-10-CM | POA: Diagnosis not present

## 2022-10-05 DIAGNOSIS — I2510 Atherosclerotic heart disease of native coronary artery without angina pectoris: Secondary | ICD-10-CM | POA: Diagnosis not present

## 2022-10-05 DIAGNOSIS — I110 Hypertensive heart disease with heart failure: Secondary | ICD-10-CM | POA: Diagnosis not present

## 2022-10-05 LAB — BASIC METABOLIC PANEL
Anion gap: 10 (ref 5–15)
BUN: 17 mg/dL (ref 8–23)
CO2: 28 mmol/L (ref 22–32)
Calcium: 8.9 mg/dL (ref 8.9–10.3)
Chloride: 93 mmol/L — ABNORMAL LOW (ref 98–111)
Creatinine, Ser: 0.65 mg/dL (ref 0.61–1.24)
GFR, Estimated: >60 mL/min (ref >60)
Glucose, Bld: 80 mg/dL (ref 70–99)
Potassium: 4 mmol/L (ref 3.5–5.1)
Sodium: 131 mmol/L — ABNORMAL LOW (ref 135–145)

## 2022-10-05 LAB — CBC
HCT: 40 % (ref 39.0–52.0)
Hemoglobin: 13 g/dL (ref 13.0–17.0)
MCH: 28.9 pg (ref 26.0–34.0)
MCHC: 32.5 g/dL (ref 30.0–36.0)
MCV: 88.9 fL (ref 80.0–100.0)
Platelets: 320 10*3/uL (ref 150–400)
RBC: 4.5 MIL/uL (ref 4.22–5.81)
RDW: 13.8 % (ref 11.5–15.5)
WBC: 9.9 10*3/uL (ref 4.0–10.5)
nRBC: 0 % (ref 0.0–0.2)

## 2022-10-05 LAB — BRAIN NATRIURETIC PEPTIDE: B Natriuretic Peptide: 132.9 pg/mL — ABNORMAL HIGH (ref 0.0–100.0)

## 2022-10-05 NOTE — Patient Instructions (Signed)
Medication Instructions:  Your physician recommends that you continue on your current medications as directed. Please refer to the Current Medication list given to you today.  *If you need a refill on your cardiac medications before your next appointment, please call your pharmacy*   Lab Work: Your provider would like for you to have following labs drawn: (BMP, BNP, CBC).   Please go to the [facility] entrance and check in at the front desk.  You do not need an appointment.  They are open from 7am-6 pm.     Testing/Procedures: Your provider has ordered a chest X-Ray for you. You can have this done at the [facility] medical mall. You do not need an appointment. Please go to the entrance of the Medical Mall and check in at the front desk.  Follow-Up: At [facility], you and your health needs are our priority.  As part of our continuing mission to provide you with exceptional heart care, we have created designated Provider Care Teams.  These Care Teams include your primary Cardiologist (physician) and Advanced Practice Providers (APPs -  Physician Assistants and Nurse Practitioners) who all work together to provide you with the care you need, when you need it.  We recommend signing up for the patient portal called "MyChart".  Sign up information is provided on this After Visit Summary.  MyChart is used to connect with patients for Virtual Visits (Telemedicine).  Patients are able to view lab/test results, encounter notes, upcoming appointments, etc.  Non-urgent messages can be sent to your provider as well.   To learn more about what you can do with MyChart, go to [email or web address].    Your next appointment:   1 month(s)  Provider:   You may see Lorine Bears, MD or one of the following Advanced Practice Providers on your designated Care Team:   Nicolasa Ducking, NP Eula Listen, PA-C Cadence Fransico Michael, PA-C Charlsie Quest, NP

## 2022-10-05 NOTE — Progress Notes (Signed)
Cardiology Office Note:    Date:  10/05/2022   ID:  Curtis Campos, DOB [birth year], MRN [id number]  PCP:  Danella Penton, MD  [facility] Cardiologist:  Lorine Bears, MD  [facility] Electrophysiologist:  None   Referring MD: Danella Penton, MD   Chief Complaint: 5 month follow-up  History of Present Illness:    Curtis Campos is a 82 y.o. male with a hx of chronic systolic heart failure, CAD, aortic stenosis s/p TAVR in 10/2021, HLD with statin intolerance, HTN, previous rectal cancer, previous tobacco use and COPD who presents for follow-up.   Echo in 2015 showed LVEF 25%, moderate MR. R/L heart cath showed severe 1V CAD with subtotal occlusion of the mid Left circumflex which appeared to be chronic.   He had worsening heart failure in 2021. Echo showed EF 30-35%, moderate aortic stenosis, mild to mod MR and moderate pulmonary HTN. Brent Bulla showed evidence of ischemia in the left Cx distrubution. The dose of lasix was increased to 40mg  daily, and losartan was switched to Entresto and small dose spiro was added. He had improvement of heart failure symptoms and repeat echo in April 2022 showed LVEF 55-60% with moderate aortic stenosis. Aortic valve area was calculated at 1.25cm2.  Spironolactone was subsequently discontinued due to hypotension. He had progression of aortic stenosis to the severe range. Cardiac cath showed no change in coronary anatomy and he subsequently underwent TAVR in 10/2021. Echo in September 2023 showed LVEF 50%, mild pulmonary hypertension, mild to moderate MR and normal functioning TAVR prosthesis with  mean gradient of and trivial aortic insufficiency.   Patient was last seen 04/2022 and was overall stable reporting chronic DOE.   Today, the patient is overall doing well. He reports cough and congestion. He coughs up mucus sometimes. Says cough has been persistent for a number of weeks now. He does breathing treatments and this helps his  cough/congestion. BP is on the low side, 98/60. He denies lightheadedness or dizziness.  He denies chest pain. He reports improved lower leg edema on exam.   Past Medical History:  Diagnosis Date   Agent orange exposure    Arthritis 03/12/2011   CHF (congestive heart failure) (HCC)    Chronic obstructive lung disease (HCC)    Coronary artery disease 03/2013   a. 03/2013 Cath: LM nl, LAD 55m, D1 50, D2 30, LCX 88m/d (chronic), OM1 50, RCA 40p, 89m, RPL 40, RPDA 60, EF 25%, mod-sev MR-->Med mgmt; b. 08/2019 MV: basal inflat/mid inflat defect consistent w/ ischemia in LCX distribution. EF 37%. Intermediate risk.   HFrEF (heart failure with reduced ejection fraction) (HCC)    a. 03/2013 TEE: EF 25%; b. 08/2017 Echo: EF 45-50%, Gr1 DD. Mod AS. Mod MR. Mildly dil LA; c. 06/2019 Echo: EF 30-35%, glob HK, gr1 DD; d. 06/2020 Echo: EF 55-60%, nl RV fxn, triv MR, Mod AS.   Hyperlipidemia    Hypertension    Ischemic cardiomyopathy    a. 03/2013 TEE: EF 25%; b. 08/2017 Echo: EF 45-50%; c. 06/2019 Echo: EF 30-35%; d. 06/2020 Echo: EF 55-60%.   Moderate aortic stenosis    a. 08/2017 Echo: Mod AS. Mean grad (S) . Valve area (VTI): 1.25cm^2; b. 06/2019 Echo: Mod AS. Mean grad , Valve area (VTI): 1.16cm^2); c. 06/2020 Mod AS. AVA 1.25cm^2, mean grad .   Moderate mitral regurgitation    a. 08/2017 Echo: mod MR; b. 06/2019 Echo: mild to mod MR.   Myocardial infarction (HCC)  pt reports ~18 years ago   Rectal cancer (HCC)    S/P TAVR (transcatheter aortic valve replacement) 11/25/2021   s/p TAVR with a 26mm Edwards S3UR via the TF approach by Dr. Clifton James & Dr. Laneta Simmers    Past Surgical History:  Procedure Laterality Date   APPENDECTOMY  06/2003   CARDIAC CATHETERIZATION  03/2013   [facility]   cataract surgery     COLOSTOMY     HERNIA REPAIR     INTRAOPERATIVE TRANSTHORACIC ECHOCARDIOGRAM N/A 11/25/2021   Procedure: INTRAOPERATIVE TRANSTHORACIC ECHOCARDIOGRAM;  Surgeon: Kathleene Hazel, MD;   Location: [facility] OR;  Service: Open Heart Surgery;  Laterality: N/A;   resection of rectum     RIGHT/LEFT HEART CATH AND CORONARY ANGIOGRAPHY N/A 08/27/2021   Procedure: RIGHT/LEFT HEART CATH AND CORONARY ANGIOGRAPHY;  Surgeon: Tonny Bollman, MD;  Location: [facility] INVASIVE CV LAB;  Service: Cardiovascular;  Laterality: N/A;   TRANSCATHETER AORTIC VALVE REPLACEMENT, TRANSFEMORAL Left 11/25/2021   Procedure: Transcatheter Aortic Valve Replacement, Transfemoral using a 26 MM Edwards SAPIEN 3 Ultra.;  Surgeon: Kathleene Hazel, MD;  Location: [facility] OR;  Service: Open Heart Surgery;  Laterality: Left;    Current Medications: Current Meds  Medication Sig   albuterol (PROVENTIL) (2.5 MG/3ML) 0.083% nebulizer solution Take 2.5 mg by nebulization every 6 (six) hours as needed for shortness of breath or wheezing.   albuterol (VENTOLIN HFA) 108 (90 Base) MCG/ACT inhaler INHALE 2 PUFFS BY MOUTH EVERY 6 HOURS AS NEEDED FOR SHORTNESS OF BREATH   Alirocumab (PRALUENT) 75 MG/ML SOAJ Inject 1 pen into the skin every 14 (fourteen) days.   amoxicillin (AMOXIL) 500 MG tablet Take 4 tablets (2,000 mg total) by mouth as directed. 1 HOUR PRIOR TO DENTAL APPOINTMENTS   aspirin 81 MG tablet Take 81 mg by mouth at bedtime.   carvedilol (COREG) 12.5 MG tablet TAKE 1 TABLET BY MOUTH TWICE DAILY   cholecalciferol (VITAMIN D3) 25 MCG (1000 UNIT) tablet Take 1,000 Units by mouth daily.   dapagliflozin propanediol (FARXIGA) 10 MG TABS tablet TAKE ONE TABLET BY MOUTH EVERY MORNING BEFORE BREAKFAST   diclofenac Sodium (VOLTAREN) 1 % GEL APPLY 4 GRAMS TO AFFECTED AREA FOUR TIMES A DAY AS NEEDED FOR JOINT PAIN FOR HIP PAIN-TOTAL DAILY DOSE SHOULD NOT EXCEED 32G PER DAY OVER ALL AFFECTEDJOINTS.FOR PAIN.   ezetimibe (ZETIA) 10 MG tablet Take 1 tablet by mouth once daily for cholesterol. NEED APPOINTMENT FOR ANY MORE REFILLS   furosemide (LASIX) 20 MG tablet Take 2 tablets (40 mg total) by mouth daily.   guaiFENesin (ROBITUSSIN) 100  MG/5ML liquid Take 10 mLs by mouth every 4 (four) hours as needed for cough or to loosen phlegm.   Magnesium Oxide 400 (240 Mg) MG TABS TAKE 1 TABLET BY MOUTH ONCE DAILY.   OXYGEN Inhale 4 L into the lungs at bedtime.   sacubitril-valsartan (ENTRESTO) 49-51 MG Take 1 tablet by mouth 2 (two) times daily.   traZODone (DESYREL) 50 MG tablet Take 1 tablet (50 mg total) by mouth at bedtime as needed. for sleep   TRELEGY ELLIPTA 100-62.5-25 MCG/ACT AEPB Inhale 1 puff into the lungs daily.     Allergies:   Codeine and Statins   Social History   Socioeconomic History   Marital status: Married    Spouse name: [name]   Number of children: 2   Years of education: Not on file   Highest education level: Not on file  Occupational History   Not on file  Tobacco Use  Smoking status: Former    Packs/day: 1.00    Years: 50.00    Additional pack years: 0.00    Total pack years: 50.00    Types: Cigarettes    Quit date: 03/25/2013    Years since quitting: 9.5   Smokeless tobacco: Never  Vaping Use   Vaping Use: Never used  Substance and Sexual Activity   Alcohol use: No    Alcohol/week: 0.0 standard drinks of alcohol   Drug use: No   Sexual activity: Not Currently  Other Topics Concern   Not on file  Social History Narrative   Arm '66-'68, [place], agent orange exposure.    Social Determinants of Health   Financial Resource Strain: Not on file  Food Insecurity: Not on file  Transportation Needs: Not on file  Physical Activity: Not on file  Stress: Not on file  Social Connections: Not on file     Family History: The patient's family history includes Cancer - Cervical in his sister; Heart attack (age of onset: 34) in his brother; Heart disease in his father, mother, sister, and sister; Hyperlipidemia in his father; Hypertension in his father.  ROS:   Please see the history of present illness.     All other systems reviewed and are negative.  EKGs/Labs/Other Studies Reviewed:     The following studies were reviewed today:  Limited echo 11/2021 1. Left ventricular ejection fraction, by estimation, is 50%. The left  ventricle has low normal function. The left ventricle has no regional wall  motion abnormalities. There is mild left ventricular hypertrophy. Left  ventricular diastolic parameters are   consistent with Grade I diastolic dysfunction (impaired relaxation).   2. Right ventricular systolic function is normal. The right ventricular  size is normal. There is mildly elevated pulmonary artery systolic  pressure. The estimated right ventricular systolic pressure is 40.5 mmHg.   3. The mitral valve is degenerative. Mild to moderate mitral valve  regurgitation. No evidence of mitral stenosis.   4. The aortic valve has been repaired/replaced. Aortic valve  regurgitation is trivial, paravalvular and anterior. There is a 26 mm  Sapien prosthetic (TAVR) valve present in the aortic position. Procedure  Date: 11/22/2021. Echo findings are consistent  with normal structure and function of the aortic valve prosthesis. Aortic  valve mean gradient measures 8.0 mmHg.   5. The inferior vena cava is normal in size with greater than 50%  respiratory variability, suggesting right atrial pressure of 3 mmHg.   Echo 10/2021 1. The aortic valve has been repaired/replaced. Aortic valve  regurgitation is trivial (PVL adjacent to the intraventricular septum).  There is a 26 mm Edwards Sapien prosthetic (TAVR) valve present in the  aortic position. Procedure Date: 11/25/21. Aortic  valve mean gradient measures 10.0 mmHg. Peak gradient 20 mm Hg. DVI 0.39,  EOA 1.5 cm2.   2. Left ventricular ejection fraction, by estimation, is 45 to 50%. The  left ventricle has mildly decreased function. The left ventricle  demonstrates global hypokinesis. Left ventricular diastolic parameters are  consistent with Grade I diastolic  dysfunction (impaired relaxation).   3. Right ventricular  systolic function is normal. The right ventricular  size is normal. There is mildly elevated pulmonary artery systolic  pressure. The estimated right ventricular systolic pressure is 41.2 mmHg.   4. The mitral valve is degenerative. Mild to moderate mitral valve  regurgitation. No evidence of mitral stenosis.   5. Tricuspid valve regurgitation is mild to moderate.   6. The inferior  vena cava is normal in size with greater than 50%  respiratory variability, suggesting right atrial pressure of 3 mmHg.   Cardiac CT 09/2021  IMPRESSION: 1. Calcified tri leaflet AV with score 2208   2. Optimum angiographic angle for deployment LAO 29 Caudal 9 degrees   3. Annular area 555 mm2 Lower range for 29 mm Sapien 3 valve Sinus length fine some concern for somewhat small calcified STJ   4.  Coronary height sufficient for deployment   5.  Membranous septal length 9 mm   Charlton Haws     Electronically Signed   By: Charlton Haws M.D.   On: 10/15/2021 11:30  R/L heart cath 07/2021 1.  Severe single-vessel coronary artery disease with severe stenosis of the mid circumflex unchanged from the previous cath study in 2015 2.  Mild nonobstructive LAD plaquing and mild ostial diagonal stenoses 3.  Mild nonobstructive mid RCA stenosis and severe distal PDA stenosis unchanged from previous cath study 4.  Severe low-flow low gradient aortic stenosis with mean transvalvular gradient of 26 mmHg, aortic valve is calcified and restricted on plain fluoroscopy   Plan: Continue TAVR evaluation, medical therapy for CAD  Coronary Diagrams  Diagnostic Dominance: Right       EKG:  EKG is ordered today.  The ekg ordered today demonstrates NSR, 68bpm, PVC, LAD, anteroseptal q waves, TWI aVL  Recent Labs: 11/21/2021: ALT 11 11/26/2021: BUN 10; Creatinine, Ser 0.78; Hemoglobin 11.9; Magnesium 1.9; Platelets 195; Potassium 3.6; Sodium 136  Recent Lipid Panel    Component Value Date/Time   CHOL 185 02/12/2020  1454   TRIG 128.0 02/12/2020 1454   HDL 53.50 02/12/2020 1454   CHOLHDL 3 02/12/2020 1454   VLDL 25.6 02/12/2020 1454   LDLCALC 106 (H) 02/12/2020 1454   LDLDIRECT 129.7 04/14/2012 0756     Physical Exam:    VS:  BP 98/60 (BP Location: Left Arm, Patient Position: Sitting, Cuff Size: Normal)   Pulse 68   Ht 5\' 6"  (1.676 m)   Wt 175 lb 2 oz (79.4 kg)   SpO2 96%   BMI 28.27 kg/m     Wt Readings from Last 3 Encounters:  10/05/22 175 lb 2 oz (79.4 kg)  05/07/22 180 lb (81.6 kg)  12/24/21 174 lb (78.9 kg)     GEN:  Well nourished, well developed in no acute distress HEENT: Normal NECK: No JVD; No carotid bruits LYMPHATICS: No lymphadenopathy CARDIAC: RRR, no murmurs, rubs, gallops RESPIRATORY:  Clear to auscultation without rales, wheezing or rhonchi  ABDOMEN: Soft, non-tender, non-distended MUSCULOSKELETAL:  No edema; No deformity  SKIN: Warm and dry NEUROLOGIC:  Alert and oriented x 3 PSYCHIATRIC:  Normal affect   ASSESSMENT:    1. Chronic systolic heart failure (HCC)   2. Acute cough   3. Coronary artery disease involving native coronary artery of native heart without angina pectoris   4. Essential hypertension   5. Hyperlipidemia LDL goal <70   6. Medication management   7. Severe aortic stenosis   8. S/P TAVR (transcatheter aortic valve replacement)   9. Chronic obstructive pulmonary disease, unspecified COPD type (HCC)    PLAN:    In order of problems listed above:  Chronic systolic heart failure Cough Patient reports persistent cough for the last few weeks. No fever or chills reported. He takes lasix 40mg  daily. He appears euvolemic on exam. Echo in 11/2021 showed LVEF 50%, G1DD, normally functioning valve. I will order a CXR, BMET, BNP and CBC.  Continue lasix 40mg  daily, Coreg 12.5mg  BID, Farxiga 10mg  daily, and Entresto 24-26mg  BID. Recommend patient monitor BP at home as it is low.   CAD He has known CTO of the mid left circumflex. He denies anginal  symptoms. Continue Aspirin 81mg  daily, Coreg 12.5mg  BID, Zetia 10mg  daily. No ischemic work-up indicated at this time.   HTN BP is soft today, but patient is asymptomatic. Continue current medications for now.   HLD LDL 101, TG 202, HDL 59, total 200. Continue Praluent and Zetia. Can re-check at follow-up.   Severe AS s/p TAVR Limited echo in 11/2021 showed LVEF 50%, G1DD, normally functioning valve with a mean gradient of .   COPD This is currently followed by PCP. He reports persistent coughing. He is taking Mucinex. No crackles on exam. I will order CXR and labs as above. May be bronchitis/COPD flare.  Disposition: Follow up in 1 month(s) with MD/APP    Signed, Tung Pustejovsky David Stall, PA-C  10/05/2022 4:47 PM    [facility]

## 2022-10-07 ENCOUNTER — Other Ambulatory Visit: Payer: Self-pay

## 2022-10-07 MED ORDER — SPIRONOLACTONE 25 MG PO TABS: 12.5000 mg | ORAL_TABLET | Freq: Every day | ORAL | 3 refills | Status: DC

## 2022-10-07 MED ORDER — CARVEDILOL 3.125 MG PO TABS: 3.1250 mg | ORAL_TABLET | Freq: Two times a day (BID) | ORAL | 3 refills | Status: DC

## 2022-10-23 ENCOUNTER — Other Ambulatory Visit: Payer: Self-pay | Admitting: Cardiovascular Disease

## 2022-10-23 MED ORDER — FUROSEMIDE 20 MG PO TABS: 40.0000 mg | ORAL_TABLET | Freq: Every day | ORAL | 3 refills | Status: DC

## 2022-10-23 MED ORDER — FUROSEMIDE 20 MG PO TABS: 40.0000 mg | ORAL_TABLET | Freq: Every day | ORAL | 0 refills | Status: DC

## 2022-10-23 NOTE — Telephone Encounter (Signed)
Pt's medication was sent to pt's pharmacy as requested. Confirmation received.  °

## 2022-10-23 NOTE — Telephone Encounter (Signed)
*  STAT* If patient is at the pharmacy, call can be transferred to refill team.   1. Which medications need to be refilled? (please list name of each medication and dose if known) furosemide (LASIX) 20 MG tablet   2. Which pharmacy/location (including street and city if local pharmacy) is medication to be sent to? [facility] - [place], [place] - [address]   3. Do they need a 30 day or 90 day supply? 90 Day Supply

## 2022-10-23 NOTE — Addendum Note (Signed)
Addended by: Margaret Pyle D on: 10/23/2022 12:46 PM   Modules accepted: Orders

## 2022-11-12 ENCOUNTER — Other Ambulatory Visit: Payer: Self-pay | Admitting: Cardiovascular Disease

## 2022-11-17 NOTE — Progress Notes (Unsigned)
[facility]   MULTIDISCIPLINARY HEART VALVE CLINIC                                     Cardiology Office Note:    Date:  11/19/2022   ID:  Curtis Campos, DOB 1940-05-08, MRN [id number]  PCP:  Curtis Penton, MD  [facility] Cardiologist:  Curtis Bears, MD  [facility] Electrophysiologist:  None   Referring MD: Curtis Penton, MD   1 year s/p TAVR  History of Present Illness:    [name] is a 82 y.o. male with a hx of COPD on home 02, former tobacco abuse, lung nodule followed at [facility], CAD, ischemic CM, HFrEF, arthritis with poor functional capacity, mitral regurgitation and severe AS s/p TAVR (11/22/21) who presents to clinic for follow up.   Echocardiogram in 05/21/21 showed EF 40-45%, and severe AS with a mean grad 22 mmHg, peak grad 39.1 mmHg, AVA 0.79 cm2, DVI 0.19, SVI 33, mild-mod MR and mild TR. Curtis Campos 08/27/21 showed severe single-vessel coronary artery disease with severe stenosis of the mid circumflex unchanged from the previous cath study in 2015. There was also mild nonobstructive LAD plaquing and mild ostial diagonal stenoses and mild nonobstructive mid RCA stenosis and severe distal PDA stenosis unchanged from previous cath study. Medical therapy was planned for his CAD. Pre TAVR CTs showed a enlarging left upper pulmonary nodule. [facility] pulm ordered a PET scan and nodule was suspicious for stage I A primary bronchogenic carcinoma.   S/p TAVR with a 26 mm Edwards Sapien 3 Ultra Resilia THV via the TF approach on 11/25/21. 1 month showed EF 50%, normally functioning TAVR with a mean gradient of 8 mm hg and no PVL as well as mild to mod MR and mild pulm HTN.  Recently seen by Curtis Fransico Michael PA-C for cough. BNP was slightly elevated and CXR non acute but did report enlarging nodule. He was started on spironolactone 12.5mg  daily. Also treated with a Z-pack by PCP.    Today the patient presents to clinic for follow up. Here with son. Has been having  nosebleeds and stopped taking his baby aspirin. Son thinks it might be related to his new oxygen tank. Has chronic shortness of breath but no chest pain. No LE edema, orthopnea or PND. No dizziness or syncope. No blood in stool or urine. No palpitations.     Past Medical History:  Diagnosis Date   Agent orange exposure    Arthritis 03/12/2011   CHF (congestive heart failure) (HCC)    Chronic obstructive lung disease (HCC)    Coronary artery disease 03/2013   a. 03/2013 Cath: LM nl, LAD 64m, D1 50, D2 30, LCX 14m/d (chronic), OM1 50, RCA 40p, 47m, RPL 40, RPDA 60, EF 25%, mod-sev MR-->Med mgmt; b. 08/2019 MV: basal inflat/mid inflat defect consistent w/ ischemia in LCX distribution. EF 37%. Intermediate risk.   HFrEF (heart failure with reduced ejection fraction) (HCC)    a. 03/2013 TEE: EF 25%; b. 08/2017 Echo: EF 45-50%, Gr1 DD. Mod AS. Mod MR. Mildly dil LA; c. 06/2019 Echo: EF 30-35%, glob HK, gr1 DD; d. 06/2020 Echo: EF 55-60%, nl RV fxn, triv MR, Mod AS.   Hyperlipidemia    Hypertension    Ischemic cardiomyopathy    a. 03/2013 TEE: EF 25%; b. 08/2017 Echo: EF 45-50%; c. 06/2019 Echo: EF 30-35%; d. 06/2020 Echo:  EF 55-60%.   Moderate aortic stenosis    a. 08/2017 Echo: Mod AS. Mean grad (S) . Valve area (VTI): 1.25cm^2; b. 06/2019 Echo: Mod AS. Mean grad , Valve area (VTI): 1.16cm^2); c. 06/2020 Mod AS. AVA 1.25cm^2, mean grad .   Moderate mitral regurgitation    a. 08/2017 Echo: mod MR; b. 06/2019 Echo: mild to mod MR.   Myocardial infarction [facility])    pt reports ~18 years ago   Rectal cancer (HCC)    S/P TAVR (transcatheter aortic valve replacement) 11/25/2021   s/p TAVR with a 26mm Edwards S3UR via the TF approach by Curtis Campos & Curtis Campos    Past Surgical History:  Procedure Laterality Date   APPENDECTOMY  06/2003   CARDIAC CATHETERIZATION  03/2013   [facility]   cataract surgery     COLOSTOMY     HERNIA REPAIR     INTRAOPERATIVE TRANSTHORACIC ECHOCARDIOGRAM N/A 11/25/2021    Procedure: INTRAOPERATIVE TRANSTHORACIC ECHOCARDIOGRAM;  Surgeon: Curtis Hazel, MD;  Location: [facility] OR;  Service: Open Heart Surgery;  Laterality: N/A;   resection of rectum     RIGHT/LEFT HEART CATH AND CORONARY ANGIOGRAPHY N/A 08/27/2021   Procedure: RIGHT/LEFT HEART CATH AND CORONARY ANGIOGRAPHY;  Surgeon: Curtis Bollman, MD;  Location: [facility] INVASIVE CV LAB;  Service: Cardiovascular;  Laterality: N/A;   TRANSCATHETER AORTIC VALVE REPLACEMENT, TRANSFEMORAL Left 11/25/2021   Procedure: Transcatheter Aortic Valve Replacement, Transfemoral using a 26 MM Edwards SAPIEN 3 Ultra.;  Surgeon: Curtis Hazel, MD;  Location: [facility] OR;  Service: Open Heart Surgery;  Laterality: Left;    Current Medications: Current Meds  Medication Sig   albuterol (PROVENTIL) (2.5 MG/3ML) 0.083% nebulizer solution Take 2.5 mg by nebulization every 6 (six) hours as needed for shortness of breath or wheezing.   albuterol (VENTOLIN HFA) 108 (90 Base) MCG/ACT inhaler INHALE 2 PUFFS BY MOUTH EVERY 6 HOURS AS NEEDED FOR SHORTNESS OF BREATH   Alirocumab (PRALUENT) 75 MG/ML SOAJ Inject 1 pen into the skin every 14 (fourteen) days.   amoxicillin (AMOXIL) 500 MG tablet Take 4 tablets (2,000 mg total) by mouth as directed. 1 HOUR PRIOR TO DENTAL APPOINTMENTS   carvedilol (COREG) 3.125 MG tablet Take 1 tablet (3.125 mg total) by mouth 2 (two) times daily.   cholecalciferol (VITAMIN D3) 25 MCG (1000 UNIT) tablet Take 1,000 Units by mouth daily.   dapagliflozin propanediol (FARXIGA) 10 MG TABS tablet TAKE ONE TABLET BY MOUTH EVERY MORNING BEFORE BREAKFAST   diclofenac Sodium (VOLTAREN) 1 % GEL APPLY 4 GRAMS TO AFFECTED AREA FOUR TIMES A DAY AS NEEDED FOR JOINT PAIN FOR HIP PAIN-TOTAL DAILY DOSE SHOULD NOT EXCEED 32G PER DAY OVER ALL AFFECTEDJOINTS.FOR PAIN.   ezetimibe (ZETIA) 10 MG tablet Take 1 tablet by mouth once daily for cholesterol. NEED APPOINTMENT FOR ANY MORE REFILLS   furosemide (LASIX) 20 MG tablet Take 2  tablets (40 mg total) by mouth daily.   Magnesium Oxide 400 (240 Mg) MG TABS TAKE 1 TABLET BY MOUTH ONCE DAILY.   OXYGEN Inhale 4 L into the lungs at bedtime.   sacubitril-valsartan (ENTRESTO) 49-51 MG Take 1 tablet by mouth 2 (two) times daily.   spironolactone (ALDACTONE) 25 MG tablet Take 0.5 tablets (12.5 mg total) by mouth daily.   traZODone (DESYREL) 50 MG tablet Take 1 tablet (50 mg total) by mouth at bedtime as needed. for sleep   TRELEGY ELLIPTA 100-62.5-25 MCG/ACT AEPB Inhale 1 puff into the lungs daily.     Allergies:  Codeine and Statins   Social History   Socioeconomic History   Marital status: Married    Spouse name: [name]   Number of children: 2   Years of education: Not on file   Highest education level: Not on file  Occupational History   Not on file  Tobacco Use   Smoking status: Former    Current packs/day: 0.00    Average packs/day: 1 pack/day for 50.0 years (50.0 ttl pk-yrs)    Types: Cigarettes    Start date: 03/26/1963    Quit date: 03/25/2013    Years since quitting: 9.6   Smokeless tobacco: Never  Vaping Use   Vaping status: Never Used  Substance and Sexual Activity   Alcohol use: No    Alcohol/week: 0.0 standard drinks of alcohol   Drug use: No   Sexual activity: Not Currently  Other Topics Concern   Not on file  Social History Narrative   Arm '66-'68, [place], agent orange exposure.    Social Determinants of Health   Financial Resource Strain: Not on file  Food Insecurity: Not on file  Transportation Needs: Not on file  Physical Activity: Not on file  Stress: Not on file  Social Connections: Not on file     Family History: The patient's family history includes Cancer - Cervical in his sister; Heart attack (age of onset: 32) in his brother; Heart disease in his father, mother, sister, and sister; Hyperlipidemia in his father; Hypertension in his father.  ROS:   Please see the history of present illness.    All other systems  reviewed and are negative.  EKGs/Labs/Other Studies Reviewed:    Cardiac Studies & Procedures   CARDIAC CATHETERIZATION  CARDIAC CATHETERIZATION 08/27/2021  Narrative 1.  Severe single-vessel coronary artery disease with severe stenosis of the mid circumflex unchanged from the previous cath study in 2015 2.  Mild nonobstructive LAD plaquing and mild ostial diagonal stenoses 3.  Mild nonobstructive mid RCA stenosis and severe distal PDA stenosis unchanged from previous cath study 4.  Severe low-flow low gradient aortic stenosis with mean transvalvular gradient of 26 mmHg, aortic valve is calcified and restricted on plain fluoroscopy  Plan: Continue TAVR evaluation, medical therapy for CAD  Findings Coronary Findings Diagnostic  Dominance: Right  Left Main The vessel exhibits minimal luminal irregularities.  Left Anterior Descending There is mild diffuse disease throughout the vessel.  First Diagonal Branch 1st Diag lesion is 50% stenosed.  Second Diagonal Branch 2nd Diag lesion is 50% stenosed.  Left Circumflex Mid Cx lesion is 90% stenosed. Severe stenosis unchanged from the previous study in 2015  Right Coronary Artery Prox RCA to Mid RCA lesion is 40% stenosed. The lesion is moderately calcified.  Right Posterior Descending Artery RPDA lesion is 80% stenosed.  Intervention  No interventions have been documented.   STRESS TESTS  NM MYOCAR MULTI W/SPECT W 08/31/2019  Narrative  T wave inversion was noted during stress in the I, II, III, aVF and V6 leads.  Downsloping ST segment depression ST segment depression was noted during stress in the V6 and II leads. Nondiagnostic EKG with Lexiscan due to resting ST abnormalities.  Defect 1: There is a medium defect of moderate severity present in the basal inferolateral and mid inferolateral location.  This is an intermediate risk study.  Findings consistent with ischemia in the left circumflex distribution  Nuclear  stress EF: 37%.   ECHOCARDIOGRAM  ECHOCARDIOGRAM COMPLETE 11/18/2022  Narrative ECHOCARDIOGRAM REPORT    Patient Name:  Curtis Campos Date of Exam: 11/18/2022 Medical Rec #:  [id number]        Height:       66.0 in Accession #:    [id number]       Weight:       175.1 lb Date of Birth:  April 22, 1940        BSA:          1.890 m Patient Age:    82 years         BP:           104/60 mmHg Patient Gender: M                HR:           69 bpm. Exam Location:  [address]  Procedure: 2D Echo, Cardiac Doppler, Color Doppler and 3D Echo  Indications:    S/P TAVR Z95.2  History:        Patient has prior history of Echocardiogram examinations, most recent 12/26/2021. CHF, CAD and Previous Myocardial Infarction; Risk Factors:Hypertension and Dyslipidemia.  Sonographer:    Thurman Coyer RDCS Referring Phys: [id number] Tobie Hellen R Ivelis Norgard  IMPRESSIONS   1. Left ventricular ejection fraction, by estimation, is 45 to 50%. Left ventricular ejection fraction by 3D volume is 47 %. The left ventricle has mildly decreased function. The left ventricle demonstrates global hypokinesis. Left ventricular diastolic parameters are consistent with Grade I diastolic dysfunction (impaired relaxation). Elevated left ventricular end-diastolic pressure. 2. Right ventricular systolic function is normal. The right ventricular size is normal. There is normal pulmonary artery systolic pressure. 3. The mitral valve is normal in structure. Mild mitral valve regurgitation. No evidence of mitral stenosis. 4. The aortic valve has been repaired/replaced. Aortic valve regurgitation is not visualized. No aortic stenosis is present. Echo findings are consistent with normal structure and function of the aortic valve prosthesis. Aortic valve area, by VTI measures 3.79 cm. Aortic valve mean gradient measures 6.0 mmHg. Aortic valve Vmax measures 1.73 m/s. 5. The inferior vena cava is normal in size with greater than 50%  respiratory variability, suggesting right atrial pressure of 3 mmHg.  FINDINGS Left Ventricle: Left ventricular ejection fraction, by estimation, is 45 to 50%. Left ventricular ejection fraction by 3D volume is 47 %. The left ventricle has mildly decreased function. The left ventricle demonstrates global hypokinesis. The left ventricular internal cavity size was normal in size. There is no left ventricular hypertrophy. Left ventricular diastolic parameters are consistent with Grade I diastolic dysfunction (impaired relaxation). Elevated left ventricular end-diastolic pressure.  Right Ventricle: The right ventricular size is normal. No increase in right ventricular wall thickness. Right ventricular systolic function is normal. There is normal pulmonary artery systolic pressure. The tricuspid regurgitant velocity is 2.59 m/s, and with an assumed right atrial pressure of 3 mmHg, the estimated right ventricular systolic pressure is 29.8 mmHg.  Left Atrium: Left atrial size was normal in size.  Right Atrium: Right atrial size was normal in size.  Pericardium: There is no evidence of pericardial effusion.  Mitral Valve: The mitral valve is normal in structure. Mild mitral valve regurgitation. No evidence of mitral valve stenosis.  Tricuspid Valve: The tricuspid valve is normal in structure. Tricuspid valve regurgitation is trivial. No evidence of tricuspid stenosis.  Aortic Valve: The aortic valve has been repaired/replaced. Aortic valve regurgitation is not visualized. No aortic stenosis is present. Aortic valve mean gradient measures 6.0 mmHg. Aortic valve peak gradient measures 11.9 mmHg. Aortic valve area,  by VTI measures 3.79 cm. There is a 26 mm Sapien prosthetic, stented (TAVR) valve present in the aortic position. Echo findings are consistent with normal structure and function of the aortic valve prosthesis.  Pulmonic Valve: The pulmonic valve was normal in structure. Pulmonic valve  regurgitation is not visualized. No evidence of pulmonic stenosis.  Aorta: The aortic root is normal in size and structure.  Venous: The inferior vena cava is normal in size with greater than 50% respiratory variability, suggesting right atrial pressure of 3 mmHg.  IAS/Shunts: No atrial level shunt detected by color flow Doppler.   LEFT VENTRICLE PLAX 2D LVIDd:         5.30 cm         Diastology LVIDs:         3.50 cm         LV e' medial:    4.05 cm/s LV PW:         0.90 cm         LV E/e' medial:  17.6 LV IVS:        1.00 cm         LV e' lateral:   5.92 cm/s LVOT diam:     2.60 cm         LV E/e' lateral: 12.0 LV SV:         141 LV SV Index:   75 LVOT Area:     5.31 cm        3D Volume EF LV 3D EF:    Left ventricul ar ejection fraction by 3D volume is 47 %.  3D Volume EF: 3D EF:        47 % LV EDV:       148 ml LV ESV:       79 ml LV SV:        69 ml  RIGHT VENTRICLE             IVC RV Basal diam:  3.00 cm     IVC diam: 1.60 cm RV Mid diam:    2.50 cm RV S prime:     11.80 cm/s TAPSE (M-mode): 2.2 cm  LEFT ATRIUM             Index        RIGHT ATRIUM           Index LA diam:        4.10 cm 2.17 cm/m   RA Area:     13.50 cm LA Vol (A2C):   71.6 ml 37.88 ml/m  RA Volume:   27.90 ml  14.76 ml/m LA Vol (A4C):   36.0 ml 19.05 ml/m LA Biplane Vol: 52.4 ml 27.72 ml/m AORTIC VALVE AV Area (Vmax):    3.78 cm AV Area (Vmean):   3.75 cm AV Area (VTI):     3.79 cm AV Vmax:           172.67 cm/s AV Vmean:          113.000 cm/s AV VTI:            0.372 m AV Peak Grad:      11.9 mmHg AV Mean Grad:      6.0 mmHg LVOT Vmax:         123.00 cm/s LVOT Vmean:        79.900 cm/s LVOT VTI:          0.266 m LVOT/AV VTI ratio: 0.71  AORTA Ao Root  diam: 2.60 cm Ao Asc diam:  3.80 cm  MITRAL VALVE               TRICUSPID VALVE MV Area (PHT): 2.73 cm    TR Peak grad:   26.8 mmHg MV Decel Time: 278 msec    TR Vmax:        259.00 cm/s MV E velocity: 71.10 cm/s MV A  velocity: 99.50 cm/s  SHUNTS MV E/A ratio:  0.71        Systemic VTI:  0.27 m Systemic Diam: 2.60 cm  Chilton Si MD Electronically signed by Chilton Si MD Signature Date/Time: 11/18/2022/4:01:51 PM    Final     CT SCANS  CT CORONARY MORPH W/CTA COR W/SCORE 10/15/2021  Addendum 10/15/2021 11:32 AM ADDENDUM REPORT: 10/15/2021 11:30  CLINICAL DATA:  Aortic stenosis  EXAM: Cardiac TAVR CT  TECHNIQUE: The patient was scanned on a Siemens Force 192 slice scanner. A 120 kV retrospective scan was triggered in the descending thoracic aorta at 111 HU's. Gantry rotation speed was 270 msecs and collimation was .9 mm. No beta blockade or nitro were given. The 3D data set was reconstructed in 5% intervals of the R-R cycle. Systolic and diastolic phases were analyzed on a dedicated work station using MPR, MIP and VRT modes. The patient received 80 cc of contrast.  FINDINGS: Aortic Valve: Calcified tri leaflet valve with score 2208  Aorta: No aneurysm normal arch vessels mild calcific atherosclerosis  Sinotubular Junction: 29.1 mm  Ascending Thoracic Aorta: 35 mm  Aortic Arch: 29 mm  Descending Thoracic Aorta: 29 mm  Sinus of Valsalva Measurements:  Non-coronary: 32.1 mm  Right - coronary: 31 mm  Height 19.6 mm  Left - coronary: 33.2 mm  Height 19.2 mm  Coronary Artery Height above Annulus:  Left Main: 16.8 mm above annulus  Right Coronary: 14.7 mm above annulus  Virtual Basal Annulus Measurements:  Maximum/Minimum Diameter: 29.3 mm x 24.9 mm  Perimeter: 87.2 mm  Area: 555 mm2  Coronary Arteries: Sufficient height above annulus for deployment  Optimum Fluoroscopic Angle for Delivery: LAO 29 Caudal 9 degrees  IMPRESSION: 1. Calcified tri leaflet AV with score 2208  2. Optimum angiographic angle for deployment LAO 29 Caudal 9 degrees  3. Annular area 555 mm2 Lower range for 29 mm Sapien 3 valve Sinus length fine some concern for somewhat small  calcified STJ  4.  Coronary height sufficient for deployment  5.  Membranous septal length 9 mm  Charlton Haws   Electronically Signed By: Charlton Haws M.D. On: 10/15/2021 11:30  Narrative EXAM: OVER-READ INTERPRETATION  CT CHEST  The following report is a limited chest CT over-read performed by radiologist Dr. Allegra Lai of [facility] on 10/15/2021. This over-read does not include interpretation of cardiac or coronary anatomy or pathology. The cardiac CTA interpretation by the cardiologist is attached.  COMPARISON:  None Available.  FINDINGS: Extracardiac findings will be described separately under dictation for contemporaneously obtained CTA chest, abdomen and pelvis.  IMPRESSION: Please see separate dictation for contemporaneously obtained CTA chest, abdomen and pelvis dated 10/15/2021 for full description of relevant extracardiac findings.  Electronically Signed: By: Allegra Lai M.D. On: 10/15/2021 10:59          EKG:  EKG is NOT ordered today.   Recent Labs: 11/21/2021: ALT 11 11/26/2021: Magnesium 1.9 10/05/2022: B Natriuretic Peptide 132.9; BUN 17; Creatinine, Ser 0.65; Hemoglobin 13.0; Platelets 320; Potassium 4.0; Sodium 131  Recent Lipid Panel  Component Value Date/Time   CHOL 185 02/12/2020 1454   TRIG 128.0 02/12/2020 1454   HDL 53.50 02/12/2020 1454   CHOLHDL 3 02/12/2020 1454   VLDL 25.6 02/12/2020 1454   LDLCALC 106 (H) 02/12/2020 1454   LDLDIRECT 129.7 04/14/2012 0756     Risk Assessment/Calculations:          Physical Exam:    VS:  BP 122/68   Pulse 71   Ht 5\' 6"  (1.676 m)   Wt 172 lb (78 kg)   SpO2 93%   BMI 27.76 kg/m     Wt Readings from Last 3 Encounters:  11/18/22 172 lb (78 kg)  10/05/22 175 lb 2 oz (79.4 kg)  05/07/22 180 lb (81.6 kg)     GEN: chronically ill appearing thin white male  HEENT: Normal NECK: No JVD LYMPHATICS: No lymphadenopathy CARDIAC: RRR, no murmurs, rubs,  gallops RESPIRATORY:  Clear to auscultation without rales, wheezing or rhonchi  ABDOMEN: Soft, non-tender, non-distended MUSCULOSKELETAL:  No edema; No deformity  SKIN: Warm and dry.  NEUROLOGIC:  Alert and oriented x 3 PSYCHIATRIC:  Normal affect   ASSESSMENT:    1. S/P TAVR (transcatheter aortic valve replacement)   2. Essential hypertension   3. Chronic obstructive pulmonary disease, unspecified COPD type (HCC)   4. HFrEF (heart failure with reduced ejection fraction) (HCC)   5. Coronary artery disease involving native coronary artery of native heart without angina pectoris   6. Pulmonary nodule    PLAN:    In order of problems listed above:  Severe AS s/p TAVR: echo today shows EF 45%, normally functioning TAVR with a mean gradient of 6 mm hg and no PVL and mild MR. He has NYHA class II symptoms. SBE prophylaxis discussed; he has amoxicillin. Continue on aspirin alone (I asked him to resume this since he had stopped after several nosebleeds). Continue regular follow up with Dr. Kirke Corin and Curtis Campos.   HTN: BP is well controlled. No changes made.    COPD: stable.    ICM/HFrEF: EF 45%. Continue GDMT with Cindi Carbon, Coreg, spiro as well as Lasix. He is seeing Curtis back in two days for follow up. I offered to get labs with new addition of spiro last visit but he wanted to wait.    CAD: no chest pain. Continue Aspirin, Praulant and Zetia   Pulmonary nodule: pre TAVR CTs showed a enlarging left upper pulmonary nodule. Follow up PET scan showed probable bronchogenic carcinoma with no evidence of mets. He does not like the Dr He was following with at [facility] and has not followed up. I am concerned about this given recent CXR showing an enlarging nodule. I offered to refer him to a local pulmonologist in our system but he wants to discuss this with Dr. Hyacinth Meeker.    Medication Adjustments/Labs and Tests Ordered: Current medicines are reviewed at length with the patient today.   Concerns regarding medicines are outlined above.  No orders of the defined types were placed in this encounter.  No orders of the defined types were placed in this encounter.   Patient Instructions  Medication Instructions:  Your physician recommends that you continue on your current medications as directed. Please refer to the Current Medication list given to you today.  *If you need a refill on your cardiac medications before your next appointment, please call your pharmacy*   Lab Work: None ordered   If you have labs (blood work) drawn today and your tests are  completely normal, you will receive your results only by: MyChart Message (if you have MyChart) OR A paper copy in the mail If you have any lab test that is abnormal or we need to change your treatment, we will call you to review the results.   Testing/Procedures: None ordered    Follow-Up: Follow up as scheduled   Other Instructions     Signed, Cline Crock, PA-C  11/19/2022 9:16 AM    [facility]

## 2022-11-18 ENCOUNTER — Ambulatory Visit (HOSPITAL_COMMUNITY): Payer: Medicare Other | Attending: Cardiovascular Disease

## 2022-11-18 ENCOUNTER — Ambulatory Visit (INDEPENDENT_AMBULATORY_CARE_PROVIDER_SITE_OTHER): Payer: Medicare Other | Admitting: Physician Assistant

## 2022-11-18 VITALS — BP 122/68 | HR 71 | Ht 66.0 in | Wt 172.0 lb

## 2022-11-18 DIAGNOSIS — R911 Solitary pulmonary nodule: Secondary | ICD-10-CM

## 2022-11-18 DIAGNOSIS — J449 Chronic obstructive pulmonary disease, unspecified: Secondary | ICD-10-CM | POA: Insufficient documentation

## 2022-11-18 DIAGNOSIS — I10 Essential (primary) hypertension: Secondary | ICD-10-CM | POA: Diagnosis not present

## 2022-11-18 DIAGNOSIS — Z952 Presence of prosthetic heart valve: Secondary | ICD-10-CM

## 2022-11-18 DIAGNOSIS — I2510 Atherosclerotic heart disease of native coronary artery without angina pectoris: Secondary | ICD-10-CM | POA: Diagnosis present

## 2022-11-18 DIAGNOSIS — I5020 Unspecified systolic (congestive) heart failure: Secondary | ICD-10-CM | POA: Diagnosis not present

## 2022-11-18 LAB — ECHOCARDIOGRAM COMPLETE
AR max vel: 3.78 cm2
AV Area VTI: 3.79 cm2
AV Area mean vel: 3.75 cm2
AV Mean grad: 6 mmHg
AV Peak grad: 11.9 mmHg
Ao pk vel: 1.73 m/s
Area-P 1/2: 2.73 cm2
S' Lateral: 3.5 cm

## 2022-11-18 NOTE — Patient Instructions (Signed)
Medication Instructions:  Your physician recommends that you continue on your current medications as directed. Please refer to the Current Medication list given to you today.  *If you need a refill on your cardiac medications before your next appointment, please call your pharmacy*   Lab Work: None ordered   If you have labs (blood work) drawn today and your tests are completely normal, you will receive your results only by: [place] (if you have MyChart) OR A paper copy in the mail If you have any lab test that is abnormal or we need to change your treatment, we will call you to review the results.   Testing/Procedures: None ordered    Follow-Up: Follow up as scheduled   Other Instructions

## 2022-11-20 ENCOUNTER — Encounter: Payer: Self-pay | Admitting: Medical

## 2022-11-20 ENCOUNTER — Ambulatory Visit: Payer: Medicare Other | Attending: Medical | Admitting: Medical

## 2022-11-20 VITALS — BP 100/56 | HR 67 | Ht 66.0 in | Wt 173.5 lb

## 2022-11-20 DIAGNOSIS — I350 Nonrheumatic aortic (valve) stenosis: Secondary | ICD-10-CM

## 2022-11-20 DIAGNOSIS — I10 Essential (primary) hypertension: Secondary | ICD-10-CM | POA: Diagnosis not present

## 2022-11-20 DIAGNOSIS — I2510 Atherosclerotic heart disease of native coronary artery without angina pectoris: Secondary | ICD-10-CM | POA: Diagnosis not present

## 2022-11-20 DIAGNOSIS — E782 Mixed hyperlipidemia: Secondary | ICD-10-CM | POA: Diagnosis not present

## 2022-11-20 DIAGNOSIS — Z952 Presence of prosthetic heart valve: Secondary | ICD-10-CM

## 2022-11-20 DIAGNOSIS — I5022 Chronic systolic (congestive) heart failure: Secondary | ICD-10-CM | POA: Diagnosis not present

## 2022-11-20 NOTE — Patient Instructions (Signed)
Medication Instructions:  Your physician recommends that you continue on your current medications as directed. Please refer to the Current Medication list given to you today.  *If you need a refill on your cardiac medications before your next appointment, please call your pharmacy*  Lab Work: Your physician recommends that you get lab work today: BMET If you have labs (blood work) drawn today and your tests are completely normal, you will receive your results only by: MyChart Message (if you have MyChart) OR A paper copy in the mail If you have any lab test that is abnormal or we need to change your treatment, we will call you to review the results.  Testing/Procedures: -None ordered  Follow-Up: At [facility], you and your health needs are our priority.  As part of our continuing mission to provide you with exceptional heart care, we have created designated Provider Care Teams.  These Care Teams include your primary Cardiologist (physician) and Advanced Practice Providers (APPs -  Physician Assistants and Nurse Practitioners) who all work together to provide you with the care you need, when you need it.  Your next appointment:   3 month(s)  Provider:   Lorine Bears, MD    Other Instructions -None

## 2022-11-20 NOTE — Progress Notes (Signed)
Cardiology Office Note:    Date:  11/20/2022   ID:  Curtis Campos, DOB [birth year], MRN [id number]  PCP:  Danella Penton, MD  [facility] Cardiologist:  Lorine Bears, MD  [facility] Electrophysiologist:  None   Referring MD: Danella Penton, MD   Chief Complaint: 2 month follow-up  History of Present Illness:    Curtis Campos is a 82 y.o. male with a hx of  chronic systolic heart failure, CAD, aortic stenosis s/p TAVR in 10/2021, HLD with statin intolerance, HTN, previous rectal cancer, previous tobacco use and COPD, nocturnal O2 at 4L who presents for follow-up.    Echo in 2015 showed LVEF 25%, moderate MR. R/L heart cath showed severe 1V CAD with subtotal occlusion of the mid Left circumflex which appeared to be chronic.    He had worsening heart failure in 2021. Echo showed EF 30-35%, moderate aortic stenosis, mild to mod MR and moderate pulmonary HTN. Brent Bulla showed evidence of ischemia in the left Cx distrubution. The dose of lasix was increased to 40mg  daily, and losartan was switched to Entresto and small dose spiro was added. He had improvement of heart failure symptoms and repeat echo in April 2022 showed LVEF 55-60% with moderate aortic stenosis. Aortic valve area was calculated at 1.25cm2.   Spironolactone was subsequently discontinued due to hypotension. He had progression of aortic stenosis to the severe range. Cardiac cath showed no change in coronary anatomy and he subsequently underwent TAVR in 10/2021. Echo in September 2023 showed LVEF 50%, mild pulmonary hypertension, mild to moderate MR and normal functioning TAVR prosthesis with  mean gradient of and trivial aortic insufficiency.   The patient was seen 10/05/22 reporting cough and congestion. BNP was 132 and Coreg was decreased and spiro was added. CXR showed no acute process.   The patient was seen by structural valve team 11/16/22 and echo showed stable aortic valve, EF 45%, mild MR.  Today,  the patient reports he has stable SOB. Coughing is better on spironolactone. No chest pain, LLE, orthopnea or pnd. Overall, seems to be doing better.   Past Medical History:  Diagnosis Date   Agent orange exposure    Arthritis 03/12/2011   CHF (congestive heart failure) (HCC)    Chronic obstructive lung disease (HCC)    Coronary artery disease 03/2013   a. 03/2013 Cath: LM nl, LAD 76m, D1 50, D2 30, LCX 2m/d (chronic), OM1 50, RCA 40p, 12m, RPL 40, RPDA 60, EF 25%, mod-sev MR-->Med mgmt; b. 08/2019 MV: basal inflat/mid inflat defect consistent w/ ischemia in LCX distribution. EF 37%. Intermediate risk.   HFrEF (heart failure with reduced ejection fraction) (HCC)    a. 03/2013 TEE: EF 25%; b. 08/2017 Echo: EF 45-50%, Gr1 DD. Mod AS. Mod MR. Mildly dil LA; c. 06/2019 Echo: EF 30-35%, glob HK, gr1 DD; d. 06/2020 Echo: EF 55-60%, nl RV fxn, triv MR, Mod AS.   Hyperlipidemia    Hypertension    Ischemic cardiomyopathy    a. 03/2013 TEE: EF 25%; b. 08/2017 Echo: EF 45-50%; c. 06/2019 Echo: EF 30-35%; d. 06/2020 Echo: EF 55-60%.   Moderate aortic stenosis    a. 08/2017 Echo: Mod AS. Mean grad (S) . Valve area (VTI): 1.25cm^2; b. 06/2019 Echo: Mod AS. Mean grad , Valve area (VTI): 1.16cm^2); c. 06/2020 Mod AS. AVA 1.25cm^2, mean grad .   Moderate mitral regurgitation    a. 08/2017 Echo: mod MR; b. 06/2019 Echo: mild to mod MR.  Myocardial infarction [facility])    pt reports ~18 years ago   Rectal cancer (HCC)    S/P TAVR (transcatheter aortic valve replacement) 11/25/2021   s/p TAVR with a 26mm Edwards S3UR via the TF approach by Dr. Clifton James & Dr. Laneta Simmers    Past Surgical History:  Procedure Laterality Date   APPENDECTOMY  06/2003   CARDIAC CATHETERIZATION  03/2013   [facility]   cataract surgery     COLOSTOMY     HERNIA REPAIR     INTRAOPERATIVE TRANSTHORACIC ECHOCARDIOGRAM N/A 11/25/2021   Procedure: INTRAOPERATIVE TRANSTHORACIC ECHOCARDIOGRAM;  Surgeon: Kathleene Hazel, MD;  Location:  [facility] OR;  Service: Open Heart Surgery;  Laterality: N/A;   resection of rectum     RIGHT/LEFT HEART CATH AND CORONARY ANGIOGRAPHY N/A 08/27/2021   Procedure: RIGHT/LEFT HEART CATH AND CORONARY ANGIOGRAPHY;  Surgeon: Tonny Bollman, MD;  Location: [facility] INVASIVE CV LAB;  Service: Cardiovascular;  Laterality: N/A;   TRANSCATHETER AORTIC VALVE REPLACEMENT, TRANSFEMORAL Left 11/25/2021   Procedure: Transcatheter Aortic Valve Replacement, Transfemoral using a 26 MM Edwards SAPIEN 3 Ultra.;  Surgeon: Kathleene Hazel, MD;  Location: [facility] OR;  Service: Open Heart Surgery;  Laterality: Left;    Current Medications: Current Meds  Medication Sig   albuterol (PROVENTIL) (2.5 MG/3ML) 0.083% nebulizer solution Take 2.5 mg by nebulization every 6 (six) hours as needed for shortness of breath or wheezing.   albuterol (VENTOLIN HFA) 108 (90 Base) MCG/ACT inhaler INHALE 2 PUFFS BY MOUTH EVERY 6 HOURS AS NEEDED FOR SHORTNESS OF BREATH   Alirocumab (PRALUENT) 75 MG/ML SOAJ Inject 1 pen into the skin every 14 (fourteen) days.   amoxicillin (AMOXIL) 500 MG tablet Take 4 tablets (2,000 mg total) by mouth as directed. 1 HOUR PRIOR TO DENTAL APPOINTMENTS   aspirin 81 MG tablet Take 81 mg by mouth at bedtime.   carvedilol (COREG) 3.125 MG tablet Take 1 tablet (3.125 mg total) by mouth 2 (two) times daily.   cholecalciferol (VITAMIN D3) 25 MCG (1000 UNIT) tablet Take 1,000 Units by mouth daily.   dapagliflozin propanediol (FARXIGA) 10 MG TABS tablet TAKE ONE TABLET BY MOUTH EVERY MORNING BEFORE BREAKFAST   diclofenac Sodium (VOLTAREN) 1 % GEL APPLY 4 GRAMS TO AFFECTED AREA FOUR TIMES A DAY AS NEEDED FOR JOINT PAIN FOR HIP PAIN-TOTAL DAILY DOSE SHOULD NOT EXCEED 32G PER DAY OVER ALL AFFECTEDJOINTS.FOR PAIN.   ezetimibe (ZETIA) 10 MG tablet Take 1 tablet by mouth once daily for cholesterol. NEED APPOINTMENT FOR ANY MORE REFILLS   furosemide (LASIX) 20 MG tablet Take 2 tablets (40 mg total) by mouth daily.   Magnesium Oxide  400 (240 Mg) MG TABS TAKE 1 TABLET BY MOUTH ONCE DAILY.   OXYGEN Inhale 4 L into the lungs at bedtime.   sacubitril-valsartan (ENTRESTO) 49-51 MG Take 1 tablet by mouth 2 (two) times daily.   spironolactone (ALDACTONE) 25 MG tablet Take 0.5 tablets (12.5 mg total) by mouth daily.   traZODone (DESYREL) 50 MG tablet Take 1 tablet (50 mg total) by mouth at bedtime as needed. for sleep   TRELEGY ELLIPTA 100-62.5-25 MCG/ACT AEPB Inhale 1 puff into the lungs daily.     Allergies:   Codeine and Statins   Social History   Socioeconomic History   Marital status: Married    Spouse name: [name]   Number of children: 2   Years of education: Not on file   Highest education level: Not on file  Occupational History   Not on file  Tobacco Use   Smoking status: Former    Current packs/day: 0.00    Average packs/day: 1 pack/day for 50.0 years (50.0 ttl pk-yrs)    Types: Cigarettes    Start date: 03/26/1963    Quit date: 03/25/2013    Years since quitting: 9.6   Smokeless tobacco: Never  Vaping Use   Vaping status: Never Used  Substance and Sexual Activity   Alcohol use: No    Alcohol/week: 0.0 standard drinks of alcohol   Drug use: No   Sexual activity: Not Currently  Other Topics Concern   Not on file  Social History Narrative   Arm '66-'68, [place], agent orange exposure.    Social Determinants of Health   Financial Resource Strain: Not on file  Food Insecurity: Not on file  Transportation Needs: Not on file  Physical Activity: Not on file  Stress: Not on file  Social Connections: Not on file     Family History: The patient's family history includes Cancer - Cervical in his sister; Heart attack (age of onset: 68) in his brother; Heart disease in his father, mother, sister, and sister; Hyperlipidemia in his father; Hypertension in his father.  ROS:   Please see the history of present illness.     All other systems reviewed and are negative.  EKGs/Labs/Other Studies  Reviewed:    The following studies were reviewed today:  Echo 10/2022 1. Left ventricular ejection fraction, by estimation, is 45 to 50%. Left  ventricular ejection fraction by 3D volume is 47 %. The left ventricle has  mildly decreased function. The left ventricle demonstrates global  hypokinesis. Left ventricular diastolic   parameters are consistent with Grade I diastolic dysfunction (impaired  relaxation). Elevated left ventricular end-diastolic pressure.   2. Right ventricular systolic function is normal. The right ventricular  size is normal. There is normal pulmonary artery systolic pressure.   3. The mitral valve is normal in structure. Mild mitral valve  regurgitation. No evidence of mitral stenosis.   4. The aortic valve has been repaired/replaced. Aortic valve  regurgitation is not visualized. No aortic stenosis is present. Echo  findings are consistent with normal structure and function of the aortic  valve prosthesis. Aortic valve area, by VTI  measures 3.79 cm. Aortic valve mean gradient measures 6.0 mmHg. Aortic  valve Vmax measures 1.73 m/s.   5. The inferior vena cava is normal in size with greater than 50%  respiratory variability, suggesting right atrial pressure of 3 mmHg.   Limited echo 11/2021 1. Left ventricular ejection fraction, by estimation, is 50%. The left  ventricle has low normal function. The left ventricle has no regional wall  motion abnormalities. There is mild left ventricular hypertrophy. Left  ventricular diastolic parameters are   consistent with Grade I diastolic dysfunction (impaired relaxation).   2. Right ventricular systolic function is normal. The right ventricular  size is normal. There is mildly elevated pulmonary artery systolic  pressure. The estimated right ventricular systolic pressure is 40.5 mmHg.   3. The mitral valve is degenerative. Mild to moderate mitral valve  regurgitation. No evidence of mitral stenosis.   4. The aortic  valve has been repaired/replaced. Aortic valve  regurgitation is trivial, paravalvular and anterior. There is a 26 mm  Sapien prosthetic (TAVR) valve present in the aortic position. Procedure  Date: 11/22/2021. Echo findings are consistent  with normal structure and function of the aortic valve prosthesis. Aortic  valve mean gradient measures 8.0 mmHg.   5. The  inferior vena cava is normal in size with greater than 50%  respiratory variability, suggesting right atrial pressure of 3 mmHg.    Echo 10/2021 1. The aortic valve has been repaired/replaced. Aortic valve  regurgitation is trivial (PVL adjacent to the intraventricular septum).  There is a 26 mm Edwards Sapien prosthetic (TAVR) valve present in the  aortic position. Procedure Date: 11/25/21. Aortic  valve mean gradient measures 10.0 mmHg. Peak gradient 20 mm Hg. DVI 0.39,  EOA 1.5 cm2.   2. Left ventricular ejection fraction, by estimation, is 45 to 50%. The  left ventricle has mildly decreased function. The left ventricle  demonstrates global hypokinesis. Left ventricular diastolic parameters are  consistent with Grade I diastolic  dysfunction (impaired relaxation).   3. Right ventricular systolic function is normal. The right ventricular  size is normal. There is mildly elevated pulmonary artery systolic  pressure. The estimated right ventricular systolic pressure is 41.2 mmHg.   4. The mitral valve is degenerative. Mild to moderate mitral valve  regurgitation. No evidence of mitral stenosis.   5. Tricuspid valve regurgitation is mild to moderate.   6. The inferior vena cava is normal in size with greater than 50%  respiratory variability, suggesting right atrial pressure of 3 mmHg.    Cardiac CT 09/2021  IMPRESSION: 1. Calcified tri leaflet AV with score 2208   2. Optimum angiographic angle for deployment LAO 29 Caudal 9 degrees   3. Annular area 555 mm2 Lower range for 29 mm Sapien 3 valve Sinus length fine some concern  for somewhat small calcified STJ   4.  Coronary height sufficient for deployment   5.  Membranous septal length 9 mm   Charlton Haws     Electronically Signed   By: Charlton Haws M.D.   On: 10/15/2021 11:30   R/L heart cath 07/2021 1.  Severe single-vessel coronary artery disease with severe stenosis of the mid circumflex unchanged from the previous cath study in 2015 2.  Mild nonobstructive LAD plaquing and mild ostial diagonal stenoses 3.  Mild nonobstructive mid RCA stenosis and severe distal PDA stenosis unchanged from previous cath study 4.  Severe low-flow low gradient aortic stenosis with mean transvalvular gradient of 26 mmHg, aortic valve is calcified and restricted on plain fluoroscopy   Plan: Continue TAVR evaluation, medical therapy for CAD   Coronary Diagrams   Diagnostic Dominance: Right         EKG:  EKG is not ordered today.    Recent Labs: 11/21/2021: ALT 11 11/26/2021: Magnesium 1.9 10/05/2022: B Natriuretic Peptide 132.9; BUN 17; Creatinine, Ser 0.65; Hemoglobin 13.0; Platelets 320; Potassium 4.0; Sodium 131  Recent Lipid Panel    Component Value Date/Time   CHOL 185 02/12/2020 1454   TRIG 128.0 02/12/2020 1454   HDL 53.50 02/12/2020 1454   CHOLHDL 3 02/12/2020 1454   VLDL 25.6 02/12/2020 1454   LDLCALC 106 (H) 02/12/2020 1454   LDLDIRECT 129.7 04/14/2012 0756     Physical Exam:    VS:  BP (!) 100/56 (BP Location: Left Arm, Patient Position: Sitting, Cuff Size: Normal)   Pulse 67   Ht 5\' 6"  (1.676 m)   Wt 173 lb 8 oz (78.7 kg)   SpO2 93%   BMI 28.00 kg/m     Wt Readings from Last 3 Encounters:  11/20/22 173 lb 8 oz (78.7 kg)  11/18/22 172 lb (78 kg)  10/05/22 175 lb 2 oz (79.4 kg)     GEN:  Well nourished,  well developed in no acute distress HEENT: Normal NECK: No JVD; No carotid bruits LYMPHATICS: No lymphadenopathy CARDIAC: RRR, + murmur, no rubs, gallops RESPIRATORY:  Clear to auscultation without rales, wheezing or rhonchi   ABDOMEN: Soft, non-tender, non-distended MUSCULOSKELETAL:  No edema; No deformity  SKIN: Warm and dry NEUROLOGIC:  Alert and oriented x 3 PSYCHIATRIC:  Normal affect   ASSESSMENT:    1. Chronic systolic heart failure (HCC)   2. Coronary artery disease involving native coronary artery of native heart without angina pectoris   3. Essential hypertension   4. Hyperlipidemia, mixed   5. Severe aortic stenosis   6. S/P TAVR (transcatheter aortic valve replacement)    PLAN:    In order of problems listed above:  Chronic HFrEF The patient reports improved cough/congestion on spironolactone. BMET today. Recent echo showed LVEF 45% with He still has stable DOE, which is unchanged. Unable to titrate GDMT due to borderline BP. Continue lasix 40mg  daily, Coreg 12.5mg  BID, Farxiga 10mg  daily, Entresto 24-26mg BID, and Spironolactone 12.5mg  daily.   CAD He has known CTO of the mid left circumflex. He denies anginal symptoms. Continue Aspirin, Coreg, Zetia. No ischemic work-up indicated at this time.   HTN BP is borderline, continue current medications.   HLD LDL 101, TG 202, HDL 59, total chol 200. Continue Praluent and Zetia.   Severe AS s/p TAVR He recently saw structural valve team and echo showed LVEF 45%, normally functioning TAVR with a mean gradient of , mild MR.   Disposition: Follow up in 3 month(s) with MD    Signed, Darshan Solanki David Stall, PA-C  11/20/2022 2:44 PM    [facility]

## 2022-11-21 LAB — BASIC METABOLIC PANEL
BUN/Creatinine Ratio: 21 (ref 10–24)
BUN: 16 mg/dL (ref 8–27)
CO2: 24 mmol/L (ref 20–29)
Calcium: 8.7 mg/dL (ref 8.6–10.2)
Chloride: 94 mmol/L — ABNORMAL LOW (ref 96–106)
Creatinine, Ser: 0.75 mg/dL — ABNORMAL LOW (ref 0.76–1.27)
Glucose: 97 mg/dL (ref 70–99)
Potassium: 4.4 mmol/L (ref 3.5–5.2)
Sodium: 133 mmol/L — ABNORMAL LOW (ref 134–144)
eGFR: 90 mL/min/{1.73_m2} (ref >59)

## 2022-12-28 ENCOUNTER — Other Ambulatory Visit: Payer: Self-pay | Admitting: Cardiovascular Disease

## 2023-01-11 ENCOUNTER — Other Ambulatory Visit: Payer: Self-pay | Admitting: Internal Medicine

## 2023-01-11 DIAGNOSIS — C3490 Malignant neoplasm of unspecified part of unspecified bronchus or lung: Secondary | ICD-10-CM

## 2023-01-18 ENCOUNTER — Emergency Department: Payer: No Typology Code available for payment source

## 2023-01-18 ENCOUNTER — Other Ambulatory Visit: Payer: Self-pay

## 2023-01-18 ENCOUNTER — Emergency Department
Admission: EM | Admit: 2023-01-18 | Discharge: 2023-01-19 | Disposition: A | Discharge status: Home / Self Care | Payer: No Typology Code available for payment source | Attending: Emergency Medicine | Admitting: Emergency Medicine

## 2023-01-18 DIAGNOSIS — I110 Hypertensive heart disease with heart failure: Secondary | ICD-10-CM | POA: Diagnosis not present

## 2023-01-18 DIAGNOSIS — Z7982 Long term (current) use of aspirin: Secondary | ICD-10-CM | POA: Diagnosis not present

## 2023-01-18 DIAGNOSIS — D72829 Elevated white blood cell count, unspecified: Secondary | ICD-10-CM | POA: Diagnosis not present

## 2023-01-18 DIAGNOSIS — D649 Anemia, unspecified: Secondary | ICD-10-CM | POA: Insufficient documentation

## 2023-01-18 DIAGNOSIS — I2510 Atherosclerotic heart disease of native coronary artery without angina pectoris: Secondary | ICD-10-CM | POA: Diagnosis not present

## 2023-01-18 DIAGNOSIS — I5020 Unspecified systolic (congestive) heart failure: Secondary | ICD-10-CM | POA: Diagnosis not present

## 2023-01-18 DIAGNOSIS — Z85048 Personal history of other malignant neoplasm of rectum, rectosigmoid junction, and anus: Secondary | ICD-10-CM | POA: Diagnosis not present

## 2023-01-18 DIAGNOSIS — R531 Weakness: Secondary | ICD-10-CM | POA: Diagnosis present

## 2023-01-18 DIAGNOSIS — J449 Chronic obstructive pulmonary disease, unspecified: Secondary | ICD-10-CM | POA: Diagnosis not present

## 2023-01-18 DIAGNOSIS — E871 Hypo-osmolality and hyponatremia: Secondary | ICD-10-CM | POA: Diagnosis not present

## 2023-01-18 LAB — TROPONIN I (HIGH SENSITIVITY): Troponin I (High Sensitivity): 9 ng/L (ref <18)

## 2023-01-18 LAB — CBC
HCT: 31.2 % — ABNORMAL LOW (ref 39.0–52.0)
Hemoglobin: 10.2 g/dL — ABNORMAL LOW (ref 13.0–17.0)
MCH: 27.5 pg (ref 26.0–34.0)
MCHC: 32.7 g/dL (ref 30.0–36.0)
MCV: 84.1 fL (ref 80.0–100.0)
Platelets: 409 10*3/uL — ABNORMAL HIGH (ref 150–400)
RBC: 3.71 MIL/uL — ABNORMAL LOW (ref 4.22–5.81)
RDW: 14.4 % (ref 11.5–15.5)
WBC: 10.6 10*3/uL — ABNORMAL HIGH (ref 4.0–10.5)
nRBC: 0 % (ref 0.0–0.2)

## 2023-01-18 LAB — BASIC METABOLIC PANEL
Anion gap: 8 (ref 5–15)
BUN: 23 mg/dL (ref 8–23)
CO2: 25 mmol/L (ref 22–32)
Calcium: 8.7 mg/dL — ABNORMAL LOW (ref 8.9–10.3)
Chloride: 92 mmol/L — ABNORMAL LOW (ref 98–111)
Creatinine, Ser: 0.76 mg/dL (ref 0.61–1.24)
GFR, Estimated: >60 mL/min (ref >60)
Glucose, Bld: 74 mg/dL (ref 70–99)
Potassium: 4.4 mmol/L (ref 3.5–5.1)
Sodium: 125 mmol/L — ABNORMAL LOW (ref 135–145)

## 2023-01-18 MED ORDER — SODIUM CHLORIDE 0.9 % IV BOLUS (SEPSIS): 1000.0000 mL | Freq: Once | INTRAVENOUS | Status: DC

## 2023-01-18 NOTE — ED Provider Triage Note (Signed)
Emergency Medicine Provider Triage Evaluation Note  Curtis Campos , a 82 y.o. male  was evaluated in triage.  Pt complains of generalized weakness.  Patient presents to the ED via EMS from home with complaints of elevated Going today but he was too weak to do so.  Patient was found to have a room air sat 89% on triage he was placed on 3 L by nasal cannula and is now satting at 96%.   Review of Systems  Positive: weakness Negative: FCS  Physical Exam  BP (!) 108/59 (BP Location: Left Arm)   Pulse 70   Temp 98.5 F (36.9 C) (Oral)   Resp 17   SpO2 99%  Gen:   Awake, no distress  NAD Resp:  Normal effort CTA MSK:   Moves extremities without difficulty  Other:    Medical Decision Making  Medically screening exam initiated at 6:36 PM.  Appropriate orders placed.  Curtis Campos was informed that the remainder of the evaluation will be completed by another provider, this initial triage assessment does not replace that evaluation, and the importance of remaining in the ED until their evaluation is complete.  Geriatric to the ED for evaluation of generalized weakness but he presents to the ED from home via EMS for evaluation.   Lissa Hoard, PA-C 01/18/23 [phone]

## 2023-01-18 NOTE — ED Triage Notes (Signed)
Pt comes via [facility] from home with c/o increased weakness. Pt states he was trying to go boating today but he was just too weak. Pt was 89% Ra and placed on 3L O2 and now at 96%. Pt has hx of afib, copd, chf.  VSS

## 2023-01-18 NOTE — ED Provider Notes (Signed)
[facility] Provider Note    Event Date/Time   First MD Initiated Contact with Patient 01/18/23 2319     (approximate)   History   Weakness   HPI  Curtis Campos is a 82 y.o. male with history of CHF on diuretics, COPD on chronic oxygen at home, CAD, hypertension, hyperlipidemia, aortic valve replacement who presents to the emergency department with complaints of generalized weakness today.  States symptoms have now resolved.  Denies fevers.  Has a nonproductive cough.  No increased shortness of breath from baseline.  No chest pain.  No vomiting or diarrhea.  No numbness, tingling or focal weakness.   History provided by patient, son.    Past Medical History:  Diagnosis Date   Agent orange exposure    Arthritis 03/12/2011   CHF (congestive heart failure) (HCC)    Chronic obstructive lung disease (HCC)    Coronary artery disease 03/2013   a. 03/2013 Cath: LM nl, LAD 70m, D1 50, D2 30, LCX 18m/d (chronic), OM1 50, RCA 40p, 9m, RPL 40, RPDA 60, EF 25%, mod-sev MR-->Med mgmt; b. 08/2019 MV: basal inflat/mid inflat defect consistent w/ ischemia in LCX distribution. EF 37%. Intermediate risk.   HFrEF (heart failure with reduced ejection fraction) (HCC)    a. 03/2013 TEE: EF 25%; b. 08/2017 Echo: EF 45-50%, Gr1 DD. Mod AS. Mod MR. Mildly dil LA; c. 06/2019 Echo: EF 30-35%, glob HK, gr1 DD; d. 06/2020 Echo: EF 55-60%, nl RV fxn, triv MR, Mod AS.   Hyperlipidemia    Hypertension    Ischemic cardiomyopathy    a. 03/2013 TEE: EF 25%; b. 08/2017 Echo: EF 45-50%; c. 06/2019 Echo: EF 30-35%; d. 06/2020 Echo: EF 55-60%.   Moderate aortic stenosis    a. 08/2017 Echo: Mod AS. Mean grad (S) . Valve area (VTI): 1.25cm^2; b. 06/2019 Echo: Mod AS. Mean grad , Valve area (VTI): 1.16cm^2); c. 06/2020 Mod AS. AVA 1.25cm^2, mean grad .   Moderate mitral regurgitation    a. 08/2017 Echo: mod MR; b. 06/2019 Echo: mild to mod MR.   Myocardial infarction [facility])    pt  reports ~18 years ago   Rectal cancer (HCC)    S/P TAVR (transcatheter aortic valve replacement) 11/25/2021   s/p TAVR with a 26mm Edwards S3UR via the TF approach by Dr. Clifton James & Dr. Laneta Simmers    Past Surgical History:  Procedure Laterality Date   APPENDECTOMY  06/2003   CARDIAC CATHETERIZATION  03/2013   [facility]   cataract surgery     COLOSTOMY     HERNIA REPAIR     INTRAOPERATIVE TRANSTHORACIC ECHOCARDIOGRAM N/A 11/25/2021   Procedure: INTRAOPERATIVE TRANSTHORACIC ECHOCARDIOGRAM;  Surgeon: Kathleene Hazel, MD;  Location: [facility] OR;  Service: Open Heart Surgery;  Laterality: N/A;   resection of rectum     RIGHT/LEFT HEART CATH AND CORONARY ANGIOGRAPHY N/A 08/27/2021   Procedure: RIGHT/LEFT HEART CATH AND CORONARY ANGIOGRAPHY;  Surgeon: Tonny Bollman, MD;  Location: [facility] INVASIVE CV LAB;  Service: Cardiovascular;  Laterality: N/A;   TRANSCATHETER AORTIC VALVE REPLACEMENT, TRANSFEMORAL Left 11/25/2021   Procedure: Transcatheter Aortic Valve Replacement, Transfemoral using a 26 MM Edwards SAPIEN 3 Ultra.;  Surgeon: Kathleene Hazel, MD;  Location: [facility] OR;  Service: Open Heart Surgery;  Laterality: Left;    MEDICATIONS:  Prior to Admission medications   Medication Sig Start Date End Date Taking? Authorizing Provider  albuterol (PROVENTIL) (2.5 MG/3ML) 0.083% nebulizer solution Take 2.5 mg by nebulization every 6 (six)  hours as needed for shortness of breath or wheezing. 10/13/21   [provider]  albuterol (VENTOLIN HFA) 108 (90 Base) MCG/ACT inhaler INHALE 2 PUFFS BY MOUTH EVERY 6 HOURS AS NEEDED FOR SHORTNESS OF BREATH 08/29/18   Doreene Nest, NP  Alirocumab (PRALUENT) 75 MG/ML SOAJ Inject 1 pen into the skin every 14 (fourteen) days. 08/25/18   Iran Ouch, MD  amoxicillin (AMOXIL) 500 MG tablet Take 4 tablets (2,000 mg total) by mouth as directed. 1 HOUR PRIOR TO DENTAL APPOINTMENTS 12/03/21   Janetta Hora, PA-C  aspirin 81 MG tablet Take 81 mg by mouth at  bedtime.    [provider]  carvedilol (COREG) 3.125 MG tablet Take 1 tablet (3.125 mg total) by mouth 2 (two) times daily. 10/07/22   Furth, Cadence H, PA-C  cholecalciferol (VITAMIN D3) 25 MCG (1000 UNIT) tablet Take 1,000 Units by mouth daily.    [provider]  dapagliflozin propanediol (FARXIGA) 10 MG TABS tablet TAKE ONE TABLET BY MOUTH EVERY MORNING BEFORE BREAKFAST 12/29/22   Iran Ouch, MD  diclofenac Sodium (VOLTAREN) 1 % GEL APPLY 4 GRAMS TO AFFECTED AREA FOUR TIMES A DAY AS NEEDED FOR JOINT PAIN FOR HIP PAIN-TOTAL DAILY DOSE SHOULD NOT EXCEED 32G PER DAY OVER ALL AFFECTEDJOINTS.FOR PAIN. 04/08/21   [provider]  ezetimibe (ZETIA) 10 MG tablet Take 1 tablet by mouth once daily for cholesterol. NEED APPOINTMENT FOR ANY MORE REFILLS 08/29/18   Doreene Nest, NP  furosemide (LASIX) 20 MG tablet Take 2 tablets (40 mg total) by mouth daily. 10/23/22   Furth, Cadence H, PA-C  Magnesium Oxide 400 (240 Mg) MG TABS TAKE 1 TABLET BY MOUTH ONCE DAILY. 05/03/17   Iran Ouch, MD  OXYGEN Inhale 4 L into the lungs at bedtime.    [provider]  sacubitril-valsartan (ENTRESTO) 49-51 MG Take 1 tablet by mouth 2 (two) times daily.    [provider]  spironolactone (ALDACTONE) 25 MG tablet Take 0.5 tablets (12.5 mg total) by mouth daily. 10/07/22 01/05/23  Furth, Cadence H, PA-C  traZODone (DESYREL) 50 MG tablet Take 1 tablet (50 mg total) by mouth at bedtime as needed. for sleep 08/29/18   Doreene Nest, NP  TRELEGY ELLIPTA 100-62.5-25 MCG/ACT AEPB Inhale 1 puff into the lungs daily. 04/16/21   [provider]    Physical Exam   Triage Vital Signs: ED Triage Vitals  Encounter Vitals Group     BP 01/18/23 1643 (!) 108/59     Systolic BP Percentile --      Diastolic BP Percentile --      Pulse Rate 01/18/23 1643 70     Resp 01/18/23 1643 17     Temp 01/18/23 1643 98.5 F (36.9 C)     Temp Source 01/18/23 1643 Oral     SpO2  01/18/23 1643 99 %     Weight --      Height --      Head Circumference --      Peak Flow --      Pain Score 01/18/23 1605 0     Pain Loc --      Pain Education --      Exclude from Growth Chart --     Most recent vital signs: Vitals:   01/18/23 1643 01/19/23 0030  BP: (!) 108/59 (!) 152/82  Pulse: 70 70  Resp: 17 17  Temp: 98.5 F (36.9 C)   SpO2: 99% 100%  CONSTITUTIONAL: Alert, oriented x 4, responds appropriately to questions. Well-appearing; well-nourished HEAD: Normocephalic, atraumatic EYES: Conjunctivae clear, pupils appear equal, sclera nonicteric ENT: normal nose; moist mucous membranes NECK: Supple, normal ROM CARD: RRR; S1 and S2 appreciated RESP: Normal chest excursion without splinting or tachypnea; breath sounds clear and equal bilaterally; no wheezes, no rhonchi, no rales, no hypoxia or respiratory distress, speaking full sentences ABD/GI: Non-distended; soft, non-tender, no rebound, no guarding, no peritoneal signs BACK: The back appears normal EXT: Normal ROM in all joints; no deformity noted, no edema SKIN: Normal color for age and race; warm; no rash on exposed skin NEURO: Moves all extremities equally, normal speech, no facial asymmetry PSYCH: The patient's mood and manner are appropriate.   ED Results / Procedures / Treatments   LABS: (all labs ordered are listed, but only abnormal results are displayed) Labs Reviewed  BASIC METABOLIC PANEL - Abnormal; Notable for the following components:      Result Value   Sodium 125 (*)    Chloride 92 (*)    Calcium 8.7 (*)    All other components within normal limits  CBC - Abnormal; Notable for the following components:   WBC 10.6 (*)    RBC 3.71 (*)    Hemoglobin 10.2 (*)    HCT 31.2 (*)    Platelets 409 (*)    All other components within normal limits  URINALYSIS, ROUTINE W REFLEX MICROSCOPIC - Abnormal; Notable for the following components:   Color, Urine YELLOW (*)    APPearance CLEAR (*)     Glucose, UA >=500 (*)    All other components within normal limits  BRAIN NATRIURETIC PEPTIDE - Abnormal; Notable for the following components:   B Natriuretic Peptide 191.0 (*)    All other components within normal limits  TROPONIN I (HIGH SENSITIVITY)     EKG:  EKG Interpretation Date/Time:  Monday January 18 2023 16:45:37 EDT Ventricular Rate:  79 PR Interval:  208 QRS Duration:  80 QT Interval:  372 QTC Calculation: 426 R Axis:   34  Text Interpretation: Sinus rhythm with Premature ventricular complexes or Fusion complexes Anteroseptal infarct (cited on or before 05-Oct-2022) Abnormal ECG When compared with ECG of 05-Oct-2022 15:19, Fusion complexes are now Present QRS axis Shifted right Confirmed by Rochele Raring [phone]) on 01/19/2023 12:02:00 AM         RADIOLOGY: My personal review and interpretation of imaging: CT head unremarkable.  Chest x-ray shows no pneumonia.  I have personally reviewed all radiology reports.   DG Chest Portable 1 View  Result Date: 01/19/2023 CLINICAL DATA:  Hypoxia EXAM: PORTABLE CHEST 1 VIEW COMPARISON:  10/05/2022 FINDINGS: Cardiac shadow is within normal limits. Changes of prior TAVR are seen. There is been interval increase of the left upper lobe mass density now measuring up to 3.7 cm. It previously measured 2.6 cm. No other definitive pulmonary nodule is seen. No bony abnormality is noted. IMPRESSION: Enlarging left upper lobe mass lesion when compare with the prior exam. Electronically Signed   By: Alcide Clever M.D.   On: 01/19/2023 01:06   CT HEAD WO CONTRAST ( )  Result Date: 01/18/2023 CLINICAL DATA:  Increasing weakness. EXAM: CT HEAD WITHOUT CONTRAST TECHNIQUE: Contiguous axial images were obtained from the base of the skull through the vertex without intravenous contrast. RADIATION DOSE REDUCTION: This exam was performed according to the departmental dose-optimization program which includes automated exposure control, adjustment of  the mA and/or kV according to patient size and/or use  of iterative reconstruction technique. COMPARISON:  10/06/2014 FINDINGS: Brain: There is no evidence for acute hemorrhage, hydrocephalus, mass lesion, or abnormal extra-axial fluid collection. No definite CT evidence for acute infarction. Diffuse loss of parenchymal volume is consistent with atrophy. Patchy low attenuation in the deep hemispheric and periventricular white matter is nonspecific, but likely reflects chronic microvascular ischemic demyelination. Vascular: No hyperdense vessel or unexpected calcification. Skull: No evidence for fracture. No worrisome lytic or sclerotic lesion. Sinuses/Orbits: The visualized paranasal sinuses and mastoid air cells are clear. Visualized portions of the globes and intraorbital fat are unremarkable. Other: None. IMPRESSION: 1. No acute intracranial abnormality. 2. Atrophy with chronic small vessel white matter ischemic disease. Electronically Signed   By: Kennith Center M.D.   On: 01/18/2023 19:30     PROCEDURES:  Critical Care performed: Yes, see critical care procedure note(s)   CRITICAL CARE Performed by: Baxter Hire Falicity Sheets   Total critical care time: 30 minutes  Critical care time was exclusive of separately billable procedures and treating other patients.  Critical care was necessary to treat or prevent imminent or life-threatening deterioration.  Critical care was time spent personally by me on the following activities: development of treatment plan with patient and/or surrogate as well as nursing, discussions with consultants, evaluation of patient's response to treatment, examination of patient, obtaining history from patient or surrogate, ordering and performing treatments and interventions, ordering and review of laboratory studies, ordering and review of radiographic studies, pulse oximetry and re-evaluation of patient's condition.   Marland Kitchen1-3 Lead EKG Interpretation  Performed by: Feather Berrie, Layla Maw,  DO Authorized by: Baylei Siebels, Layla Maw, DO     Interpretation: normal     ECG rate:  70   ECG rate assessment: normal     Rhythm: sinus rhythm     Ectopy: none     Conduction: normal       IMPRESSION / MDM / ASSESSMENT AND PLAN / ED COURSE  I reviewed the triage vital signs and the nursing notes.    Patient here with complaints of generalized weakness.  Now asymptomatic.  The patient is on the cardiac monitor to evaluate for evidence of arrhythmia and/or significant heart rate changes.   DIFFERENTIAL DIAGNOSIS (includes but not limited to):   Hypoglycemia, electrolyte derangement, anemia, ACS, dehydration, UTI, thyroid dysfunction   Patient's presentation is most consistent with acute presentation with potential threat to life or bodily function.   PLAN: Workup initiated from triage.  Patient has slight leukocytosis.  Also has mild anemia.  Troponin negative.  BNP 191.  Urine, chest x-ray pending.    Patient found to have a sodium level of 125.  It was 133 in August.  I have recommended admission for this but patient states he is feeling better and wants to go home.  Patient is alert and oriented.  We have discussed risk of leaving without further treatment including but not limited to worsening symptoms, severe and permanent disability, death.  Patient and son verbalized understanding.  He does agree to stay for urinalysis, chest x-ray.   MEDICATIONS GIVEN IN ED: Medications - No data to display   ED COURSE: Urine shows no sign of infection.  Chest x-ray reviewed and interpreted by myself and the radiologist and shows enlarging left upper lobe mass that has been present on previous imaging.  He has been followed by pulmonology for this.  He has had a positive PET scan but refused lobectomy and wanted to delay XRT.  I recommended that the patient  follow-up with his pulmonologist for this.  I have also recommended that he follow-up closely with his PCP for his hyponatremia and return  to the emergency department anytime if symptoms worsen or he changes his mind about admission.   CONSULTS: Admission recommended but patient decided to leave AMA.   OUTSIDE RECORDS REVIEWED: Reviewed most recent pulmonology notes.       FINAL CLINICAL IMPRESSION(S) / ED DIAGNOSES   Final diagnoses:  Generalized weakness  Hyponatremia     Rx / DC Orders   ED Discharge Orders     None        Note:  This document was prepared using Dragon voice recognition software and may include unintentional dictation errors.   Martice Doty, Layla Maw, DO 01/19/23 [phone]

## 2023-01-19 ENCOUNTER — Ambulatory Visit: Admission: RE | Admit: 2023-01-19 | Payer: Medicare Other | Source: Ambulatory Visit

## 2023-01-19 LAB — URINALYSIS, ROUTINE W REFLEX MICROSCOPIC
Bacteria, UA: NONE SEEN
Bilirubin Urine: NEGATIVE
Glucose, UA: >=500 mg/dL — AB
Hgb urine dipstick: NEGATIVE
Ketones, ur: NEGATIVE mg/dL
Leukocytes,Ua: NEGATIVE
Nitrite: NEGATIVE
Protein, ur: NEGATIVE mg/dL
Specific Gravity, Urine: 1.022 (ref 1.005–1.030)
pH: 5 (ref 5.0–8.0)

## 2023-01-19 LAB — BRAIN NATRIURETIC PEPTIDE: B Natriuretic Peptide: 191 pg/mL — ABNORMAL HIGH (ref 0.0–100.0)

## 2023-01-19 NOTE — Discharge Instructions (Signed)
Your sodium level today was critically low at 125.  We have recommended admission but you have decided to leave AGAINST MEDICAL ADVICE.   We have discussed risk of worsening symptoms, seizures, brain swelling, severe and permanent disability, death.  I strongly encourage you to follow-up closely with your primary care doctor.  If at any point you change your mind and would like to be reevaluated, please return to the emergency department.

## 2023-01-22 ENCOUNTER — Ambulatory Visit
Admission: RE | Admit: 2023-01-22 | Discharge: 2023-01-22 | Disposition: A | Discharge status: Home / Self Care | Payer: Medicare Other | Source: Ambulatory Visit | Attending: Internal Medicine | Admitting: Internal Medicine

## 2023-01-22 DIAGNOSIS — C3490 Malignant neoplasm of unspecified part of unspecified bronchus or lung: Secondary | ICD-10-CM | POA: Diagnosis present

## 2023-01-22 MED ORDER — IOHEXOL 300 MG/ML  SOLN
75.0000 mL | Freq: Once | INTRAMUSCULAR | Status: AC | PRN
  Administered 2023-01-22: 75 mL via INTRAVENOUS

## 2023-01-25 ENCOUNTER — Encounter: Payer: Self-pay | Admitting: *Deleted

## 2023-01-25 DIAGNOSIS — R918 Other nonspecific abnormal finding of lung field: Secondary | ICD-10-CM

## 2023-01-25 NOTE — Progress Notes (Signed)
Pt scheduled as new patient visit with Dr. Orlie Dakin tomorrow. Chart reviewed. Pt will need further workup with PET scan. Orders placed. Will review appt once scheduled with patient at new pt visit tomorrow. Nothing further needed at this time.

## 2023-01-26 ENCOUNTER — Encounter: Payer: Self-pay | Admitting: *Deleted

## 2023-01-26 ENCOUNTER — Inpatient Hospital Stay: Payer: No Typology Code available for payment source | Attending: Oncology | Admitting: Oncology

## 2023-01-26 ENCOUNTER — Inpatient Hospital Stay: Payer: Medicare Other

## 2023-01-26 ENCOUNTER — Other Ambulatory Visit: Payer: Self-pay | Admitting: *Deleted

## 2023-01-26 ENCOUNTER — Encounter: Payer: Self-pay | Admitting: Oncology

## 2023-01-26 VITALS — BP 84/58 | HR 73 | Temp 96.5°F | Resp 18 | Ht 66.0 in | Wt 167.0 lb

## 2023-01-26 DIAGNOSIS — E871 Hypo-osmolality and hyponatremia: Secondary | ICD-10-CM | POA: Diagnosis not present

## 2023-01-26 DIAGNOSIS — I5022 Chronic systolic (congestive) heart failure: Secondary | ICD-10-CM | POA: Diagnosis not present

## 2023-01-26 DIAGNOSIS — Z923 Personal history of irradiation: Secondary | ICD-10-CM | POA: Insufficient documentation

## 2023-01-26 DIAGNOSIS — Z933 Colostomy status: Secondary | ICD-10-CM | POA: Insufficient documentation

## 2023-01-26 DIAGNOSIS — I110 Hypertensive heart disease with heart failure: Secondary | ICD-10-CM | POA: Insufficient documentation

## 2023-01-26 DIAGNOSIS — D649 Anemia, unspecified: Secondary | ICD-10-CM | POA: Diagnosis not present

## 2023-01-26 DIAGNOSIS — Z85048 Personal history of other malignant neoplasm of rectum, rectosigmoid junction, and anus: Secondary | ICD-10-CM | POA: Insufficient documentation

## 2023-01-26 DIAGNOSIS — Z7984 Long term (current) use of oral hypoglycemic drugs: Secondary | ICD-10-CM | POA: Diagnosis not present

## 2023-01-26 DIAGNOSIS — Z7951 Long term (current) use of inhaled steroids: Secondary | ICD-10-CM | POA: Diagnosis not present

## 2023-01-26 DIAGNOSIS — R918 Other nonspecific abnormal finding of lung field: Secondary | ICD-10-CM

## 2023-01-26 DIAGNOSIS — C20 Malignant neoplasm of rectum: Secondary | ICD-10-CM

## 2023-01-26 DIAGNOSIS — D72829 Elevated white blood cell count, unspecified: Secondary | ICD-10-CM | POA: Insufficient documentation

## 2023-01-26 DIAGNOSIS — Z79899 Other long term (current) drug therapy: Secondary | ICD-10-CM | POA: Insufficient documentation

## 2023-01-26 DIAGNOSIS — Z9221 Personal history of antineoplastic chemotherapy: Secondary | ICD-10-CM | POA: Diagnosis not present

## 2023-01-26 DIAGNOSIS — Z7982 Long term (current) use of aspirin: Secondary | ICD-10-CM | POA: Insufficient documentation

## 2023-01-26 DIAGNOSIS — Z87891 Personal history of nicotine dependence: Secondary | ICD-10-CM | POA: Diagnosis not present

## 2023-01-26 LAB — CBC WITH DIFFERENTIAL (CANCER CENTER ONLY)
Abs Immature Granulocytes: 0.07 10*3/uL (ref 0.00–0.07)
Basophils Absolute: 0 10*3/uL (ref 0.0–0.1)
Basophils Relative: 0 %
Eosinophils Absolute: 0.2 10*3/uL (ref 0.0–0.5)
Eosinophils Relative: 2 %
HCT: 30.3 % — ABNORMAL LOW (ref 39.0–52.0)
Hemoglobin: 9.7 g/dL — ABNORMAL LOW (ref 13.0–17.0)
Immature Granulocytes: 1 %
Lymphocytes Relative: 11 %
Lymphs Abs: 1.3 10*3/uL (ref 0.7–4.0)
MCH: 27.5 pg (ref 26.0–34.0)
MCHC: 32 g/dL (ref 30.0–36.0)
MCV: 85.8 fL (ref 80.0–100.0)
Monocytes Absolute: 1.1 10*3/uL — ABNORMAL HIGH (ref 0.1–1.0)
Monocytes Relative: 10 %
Neutro Abs: 8.6 10*3/uL — ABNORMAL HIGH (ref 1.7–7.7)
Neutrophils Relative %: 76 %
Platelet Count: 367 10*3/uL (ref 150–400)
RBC: 3.53 MIL/uL — ABNORMAL LOW (ref 4.22–5.81)
RDW: 14.3 % (ref 11.5–15.5)
WBC Count: 11.3 10*3/uL — ABNORMAL HIGH (ref 4.0–10.5)
nRBC: 0 % (ref 0.0–0.2)

## 2023-01-26 LAB — CMP (CANCER CENTER ONLY)
ALT: 12 U/L (ref 0–44)
AST: 14 U/L — ABNORMAL LOW (ref 15–41)
Albumin: 2.8 g/dL — ABNORMAL LOW (ref 3.5–5.0)
Alkaline Phosphatase: 104 U/L (ref 38–126)
Anion gap: 8 (ref 5–15)
BUN: 15 mg/dL (ref 8–23)
CO2: 28 mmol/L (ref 22–32)
Calcium: 8.9 mg/dL (ref 8.9–10.3)
Chloride: 94 mmol/L — ABNORMAL LOW (ref 98–111)
Creatinine: 0.72 mg/dL (ref 0.61–1.24)
GFR, Estimated: >60 mL/min (ref >60)
Glucose, Bld: 102 mg/dL — ABNORMAL HIGH (ref 70–99)
Potassium: 4.5 mmol/L (ref 3.5–5.1)
Sodium: 130 mmol/L — ABNORMAL LOW (ref 135–145)
Total Bilirubin: 0.9 mg/dL (ref 0.3–1.2)
Total Protein: 6.6 g/dL (ref 6.5–8.1)

## 2023-01-26 MED ORDER — HYDROCOD POLI-CHLORPHE POLI ER 10-8 MG/5ML PO SUER: 5.0000 mL | Freq: Two times a day (BID) | ORAL | 0 refills | Status: DC | PRN

## 2023-01-26 NOTE — Progress Notes (Signed)
[facility]  Telephone:([phone]) [phone] Fax:([phone]) [phone]  ID: Curtis Campos OB: 06-01-1940  MR#: [id number]  YQI#:[id number]  Patient Care Team: Danella Penton, MD as PCP - General (Internal Medicine) Iran Ouch, MD as PCP - Cardiology (Cardiology) Iran Ouch, MD as Consulting Physician (Cardiology) Glory Buff, RN as Oncology Nurse Navigator Orlie Dakin, Tollie Pizza, MD as Consulting Physician (Oncology)  CHIEF COMPLAINT: Left upper lobe lung mass.  INTERVAL HISTORY: Patient is an 82 year old male with a distant history of rectal cancer who was noted to have a small left upper lobe pulmonary nodule on CT scan as far back as December 2022.  PET scan on November 14, 2021 revealed the nodule was hypermetabolic concerning for malignancy.  More recently, patient presented to the emergency room with increasing weakness and fatigue and was found to have significant hyponatremia.  Admission was recommended, but patient declined and left the emergency room AMA.  Follow-up CT scan on January 22, 2023 reviewed independently revealing the left upper lobe lung nodule had significantly increased in size.  Patient has persistent weakness and fatigue, cough, and congestion, but otherwise feels well.  He has no neurologic complaints.  He denies any recent fevers.  He has a good appetite and denies weight loss.  He has no chest pain or hemoptysis.  He denies any nausea, vomiting, constipation, or diarrhea.  He has no melena or hematochezia in his colostomy bag.  He has no urinary complaints.  Patient offers no further specific complaints today.  REVIEW OF SYSTEMS:   Review of Systems  Constitutional:  Positive for malaise/fatigue. Negative for fever.  HENT:  Positive for congestion.   Respiratory:  Positive for cough and shortness of breath.   Cardiovascular: Negative.  Negative for chest pain and leg swelling.  Gastrointestinal: Negative.  Negative for abdominal pain, blood in  stool and melena.  Genitourinary: Negative.  Negative for dysuria.  Musculoskeletal: Negative.  Negative for back pain.  Skin: Negative.  Negative for rash.  Neurological:  Positive for weakness. Negative for dizziness, focal weakness and headaches.  Psychiatric/Behavioral: Negative.  The patient is not nervous/anxious.     As per HPI. Otherwise, a complete review of systems is negative.  PAST MEDICAL HISTORY: Past Medical History:  Diagnosis Date   Agent orange exposure    Arthritis 03/12/2011   CHF (congestive heart failure) (HCC)    Chronic obstructive lung disease (HCC)    Coronary artery disease 03/2013   a. 03/2013 Cath: LM nl, LAD 58m, D1 50, D2 30, LCX 17m/d (chronic), OM1 50, RCA 40p, 41m, RPL 40, RPDA 60, EF 25%, mod-sev MR-->Med mgmt; b. 08/2019 MV: basal inflat/mid inflat defect consistent w/ ischemia in LCX distribution. EF 37%. Intermediate risk.   HFrEF (heart failure with reduced ejection fraction) (HCC)    a. 03/2013 TEE: EF 25%; b. 08/2017 Echo: EF 45-50%, Gr1 DD. Mod AS. Mod MR. Mildly dil LA; c. 06/2019 Echo: EF 30-35%, glob HK, gr1 DD; d. 06/2020 Echo: EF 55-60%, nl RV fxn, triv MR, Mod AS.   Hyperlipidemia    Hypertension    Ischemic cardiomyopathy    a. 03/2013 TEE: EF 25%; b. 08/2017 Echo: EF 45-50%; c. 06/2019 Echo: EF 30-35%; d. 06/2020 Echo: EF 55-60%.   Moderate aortic stenosis    a. 08/2017 Echo: Mod AS. Mean grad (S) . Valve area (VTI): 1.25cm^2; b. 06/2019 Echo: Mod AS. Mean grad , Valve area (VTI): 1.16cm^2); c. 06/2020 Mod AS. AVA 1.25cm^2, mean grad .  Moderate mitral regurgitation    a. 08/2017 Echo: mod MR; b. 06/2019 Echo: mild to mod MR.   Myocardial infarction [facility])    pt reports ~18 years ago   Rectal cancer (HCC)    S/P TAVR (transcatheter aortic valve replacement) 11/25/2021   s/p TAVR with a 26mm Edwards S3UR via the TF approach by Dr. Clifton James & Dr. Laneta Simmers    PAST SURGICAL HISTORY: Past Surgical History:  Procedure Laterality Date    APPENDECTOMY  06/2003   CARDIAC CATHETERIZATION  03/2013   [facility]   cataract surgery     COLOSTOMY     HERNIA REPAIR     INTRAOPERATIVE TRANSTHORACIC ECHOCARDIOGRAM N/A 11/25/2021   Procedure: INTRAOPERATIVE TRANSTHORACIC ECHOCARDIOGRAM;  Surgeon: Kathleene Hazel, MD;  Location: [facility] OR;  Service: Open Heart Surgery;  Laterality: N/A;   resection of rectum     RIGHT/LEFT HEART CATH AND CORONARY ANGIOGRAPHY N/A 08/27/2021   Procedure: RIGHT/LEFT HEART CATH AND CORONARY ANGIOGRAPHY;  Surgeon: Tonny Bollman, MD;  Location: [facility] INVASIVE CV LAB;  Service: Cardiovascular;  Laterality: N/A;   TRANSCATHETER AORTIC VALVE REPLACEMENT, TRANSFEMORAL Left 11/25/2021   Procedure: Transcatheter Aortic Valve Replacement, Transfemoral using a 26 MM Edwards SAPIEN 3 Ultra.;  Surgeon: Kathleene Hazel, MD;  Location: [facility] OR;  Service: Open Heart Surgery;  Laterality: Left;    FAMILY HISTORY: Family History  Problem Relation Age of Onset   Heart disease Mother    Heart disease Father    Hyperlipidemia Father    Hypertension Father    Heart disease Sister        stents placed    Cancer - Cervical Sister    Heart disease Sister        CABG   Heart attack Brother 45       MI    ADVANCED DIRECTIVES (Y/N):  N  HEALTH MAINTENANCE: Social History   Tobacco Use   Smoking status: Former    Current packs/day: 0.00    Average packs/day: 1 pack/day for 50.0 years (50.0 ttl pk-yrs)    Types: Cigarettes    Start date: 03/26/1963    Quit date: 03/25/2013    Years since quitting: 9.8   Smokeless tobacco: Never  Vaping Use   Vaping status: Never Used  Substance Use Topics   Alcohol use: No    Alcohol/week: 0.0 standard drinks of alcohol   Drug use: No     Colonoscopy:  PAP:  Bone density:  Lipid panel:  Allergies  Allergen Reactions   Codeine Nausea Only   Statins Other (See Comments)    REACTION: maylgias    Current Outpatient Medications  Medication Sig Dispense Refill    albuterol (PROVENTIL) (2.5 MG/3ML) 0.083% nebulizer solution Take 2.5 mg by nebulization every 6 (six) hours as needed for shortness of breath or wheezing.     albuterol (VENTOLIN HFA) 108 (90 Base) MCG/ACT inhaler INHALE 2 PUFFS BY MOUTH EVERY 6 HOURS AS NEEDED FOR SHORTNESS OF BREATH 18 g 0   Alirocumab (PRALUENT) 75 MG/ML SOAJ Inject 1 pen into the skin every 14 (fourteen) days. 2 pen 11   aspirin 81 MG tablet Take 81 mg by mouth at bedtime.     carvedilol (COREG) 6.25 MG tablet Take 6.25 mg by mouth 2 (two) times daily with a meal.     cholecalciferol (VITAMIN D3) 25 MCG (1000 UNIT) tablet Take 1,000 Units by mouth daily.     dapagliflozin propanediol (FARXIGA) 10 MG TABS tablet TAKE ONE TABLET BY  MOUTH EVERY MORNING BEFORE BREAKFAST 30 tablet 1   diclofenac Sodium (VOLTAREN) 1 % GEL APPLY 4 GRAMS TO AFFECTED AREA FOUR TIMES A DAY AS NEEDED FOR JOINT PAIN FOR HIP PAIN-TOTAL DAILY DOSE SHOULD NOT EXCEED 32G PER DAY OVER ALL AFFECTEDJOINTS.FOR PAIN.     ezetimibe (ZETIA) 10 MG tablet Take 1 tablet by mouth once daily for cholesterol. NEED APPOINTMENT FOR ANY MORE REFILLS 90 tablet 1   fluticasone-salmeterol (ADVAIR) 250-50 MCG/ACT AEPB Inhale 1 puff into the lungs in the morning and at bedtime.     furosemide (LASIX) 20 MG tablet Take 2 tablets (40 mg total) by mouth daily. 180 tablet 3   Magnesium Oxide 400 (240 Mg) MG TABS TAKE 1 TABLET BY MOUTH ONCE DAILY. 90 tablet 3   OXYGEN Inhale 4 L into the lungs at bedtime.     sacubitril-valsartan (ENTRESTO) 49-51 MG Take 1 tablet by mouth 2 (two) times daily.     sodium chloride 1 g tablet Take 1 g by mouth daily.     spironolactone (ALDACTONE) 25 MG tablet Take 0.5 tablets (12.5 mg total) by mouth daily. 45 tablet 3   traZODone (DESYREL) 50 MG tablet Take 1 tablet (50 mg total) by mouth at bedtime as needed. for sleep 90 tablet 0   TRELEGY ELLIPTA 100-62.5-25 MCG/ACT AEPB Inhale 1 puff into the lungs daily.     amoxicillin (AMOXIL) 500 MG tablet Take  4 tablets (2,000 mg total) by mouth as directed. 1 HOUR PRIOR TO DENTAL APPOINTMENTS (Patient not taking: Reported on 01/26/2023) 12 tablet 6   No current facility-administered medications for this visit.    OBJECTIVE: Vitals:   01/26/23 1321 01/26/23 1332  BP: (!) 80/59 (!) 84/58  Pulse: 73   Resp: 18   Temp: (!) 96.5 F (35.8 C)   SpO2: 93%      Body mass index is 26.95 kg/m.    ECOG FS:1 - Symptomatic but completely ambulatory  General: Well-developed, well-nourished, no acute distress.  Sitting in a wheelchair. Eyes: Pink conjunctiva, anicteric sclera. HEENT: Normocephalic, moist mucous membranes. Lungs: No audible wheezing or coughing. Heart: Regular rate and rhythm. Abdomen: Soft, nontender, no obvious distention. Musculoskeletal: No edema, cyanosis, or clubbing. Neuro: Alert, answering all questions appropriately. Cranial nerves grossly intact. Skin: No rashes or petechiae noted. Psych: Normal affect. Lymphatics: No cervical, calvicular, axillary or inguinal LAD.   LAB RESULTS:  Lab Results  Component Value Date   NA 125 (L) 01/18/2023   K 4.4 01/18/2023   CL 92 (L) 01/18/2023   CO2 25 01/18/2023   GLUCOSE 74 01/18/2023   BUN 23 01/18/2023   CREATININE 0.76 01/18/2023   CALCIUM 8.7 (L) 01/18/2023   PROT 6.8 11/21/2021   ALBUMIN 3.8 11/21/2021   AST 16 11/21/2021   ALT 11 11/21/2021   ALKPHOS 74 11/21/2021   BILITOT 1.3 (H) 11/21/2021   GFRNONAA >60 01/18/2023   GFRAA 79 04/04/2020    Lab Results  Component Value Date   WBC 10.6 (H) 01/18/2023   NEUTROABS 11.9 (H) 03/14/2021   HGB 10.2 (L) 01/18/2023   HCT 31.2 (L) 01/18/2023   MCV 84.1 01/18/2023   PLT 409 (H) 01/18/2023     STUDIES: CT CHEST W CONTRAST  Result Date: 01/26/2023 CLINICAL DATA:  Abnormal chest radiograph. History of rectal cancer. * Tracking Code: BO * EXAM: CT CHEST WITH CONTRAST TECHNIQUE: Multidetector CT imaging of the chest was performed during intravenous contrast  administration. RADIATION DOSE REDUCTION: This exam was performed  according to the departmental dose-optimization program which includes automated exposure control, adjustment of the mA and/or kV according to patient size and/or use of iterative reconstruction technique. CONTRAST:  75mL OMNIPAQUE IOHEXOL 300 MG/ML  SOLN COMPARISON:  PET-CT 07/15/2021 FINDINGS: Cardiovascular: No significant vascular findings. Normal heart size. No pericardial effusion. Mediastinum/Nodes: No enlarged paratracheal nodes. No supraclavicular nodes no axillary adenopathy No change in subcarinal node carina measuring 1.4 x 3.5 cm. No change from comparison FDG PET scan. Lungs/Pleura: Considerable enlargement LEFT upper lobe nodule from comparison PET-CT which is now mass size measuring 3.8 x 3.2 cm (image 64/2) compared to 0.9 cm. Peripheral nodularity in the upper lobes not changed from prior favored benign scarring. Upper Abdomen: Limited view of the liver, kidneys, pancreas are unremarkable. Normal adrenal glands. Musculoskeletal: No aggressive osseous lesion. IMPRESSION: 1. Considerable enlargement of LEFT upper lobe nodule from comparison PET-CT. Nodule now mass size. Findings are concerning for primary bronchogenic carcinoma. 2. No change in subcarinal node from comparison PET-CT. 3. Peripheral nodularity in the upper lobes is favored benign scarring. These results will be called to the ordering clinician or representative by the Radiologist Assistant, and communication documented in the PACS or [employer]. Electronically Signed   By: Genevive Bi M.D.   On: 01/26/2023 12:51   DG Chest Portable 1 View  Result Date: 01/19/2023 CLINICAL DATA:  Hypoxia EXAM: PORTABLE CHEST 1 VIEW COMPARISON:  10/05/2022 FINDINGS: Cardiac shadow is within normal limits. Changes of prior TAVR are seen. There is been interval increase of the left upper lobe mass density now measuring up to 3.7 cm. It previously measured 2.6 cm. No other  definitive pulmonary nodule is seen. No bony abnormality is noted. IMPRESSION: Enlarging left upper lobe mass lesion when compare with the prior exam. Electronically Signed   By: Alcide Clever M.D.   On: 01/19/2023 01:06   CT HEAD WO CONTRAST ( )  Result Date: 01/18/2023 CLINICAL DATA:  Increasing weakness. EXAM: CT HEAD WITHOUT CONTRAST TECHNIQUE: Contiguous axial images were obtained from the base of the skull through the vertex without intravenous contrast. RADIATION DOSE REDUCTION: This exam was performed according to the departmental dose-optimization program which includes automated exposure control, adjustment of the mA and/or kV according to patient size and/or use of iterative reconstruction technique. COMPARISON:  10/06/2014 FINDINGS: Brain: There is no evidence for acute hemorrhage, hydrocephalus, mass lesion, or abnormal extra-axial fluid collection. No definite CT evidence for acute infarction. Diffuse loss of parenchymal volume is consistent with atrophy. Patchy low attenuation in the deep hemispheric and periventricular white matter is nonspecific, but likely reflects chronic microvascular ischemic demyelination. Vascular: No hyperdense vessel or unexpected calcification. Skull: No evidence for fracture. No worrisome lytic or sclerotic lesion. Sinuses/Orbits: The visualized paranasal sinuses and mastoid air cells are clear. Visualized portions of the globes and intraorbital fat are unremarkable. Other: None. IMPRESSION: 1. No acute intracranial abnormality. 2. Atrophy with chronic small vessel white matter ischemic disease. Electronically Signed   By: Kennith Center M.D.   On: 01/18/2023 19:30    ASSESSMENT: Left upper lobe lung mass.  PLAN:    Left upper lobe lung mass: Highly suspicious for underlying malignancy.  Patient has a distant history of rectal cancer, but suspect this is a second primary.  CT scan results from January 22, 2023 reviewed independently and report as above with a  3.8 x 3.5 cm left upper lobe lung mass.  On previous imaging in August 2023 this was hypermetabolic, but measured less than 1  cm.  Patient has repeat PET scan scheduled for next week.  Have also sent a referral to pulmonology for consideration of EBUS/bronchoscopy.  Patient will return to clinic 1 week after his biopsy to discuss the results and treatment planning if necessary. Rectal cancer: Diagnosed in approximately 2004.  Patient reports he underwent chemotherapy and radiation therapy.  He also has a permanent colostomy.  CEA was ordered for completeness. Hyponatremia: Likely related to possible underlying malignant.  Patient's most recent sodium level is 125.  Today's result is pending.   Hypotension: Patient does not complain of dizziness.  Monitor.   Leukocytosis: Likely reactive, monitor. Anemia: Patient's most recent hemoglobin is 10.2, monitor.   Patient expressed understanding and was in agreement with this plan. He also understands that He can call clinic at any time with any questions, concerns, or complaints.    Cancer Staging  No matching staging information was found for the patient.   Jeralyn Ruths, MD   01/26/2023 2:32 PM

## 2023-01-26 NOTE — Progress Notes (Signed)
Met with patient during initial consult with Dr. Orlie Dakin. All questions answered during visit. Reviewed upcoming appts. Informed that will call this afternoon with his appt for PET scan and to inform of sodium levels. Pt requested prescription for cough suppressant. MD made aware and will send in prescription to his pharmacy. Contact info given and instructed to call with any questions or needs. Referral to [facility] faxed and pt made aware to expect call from their office with his appt.

## 2023-01-27 LAB — CEA: CEA: 0.8 ng/mL (ref 0.0–4.7)

## 2023-02-01 ENCOUNTER — Ambulatory Visit
Admission: RE | Admit: 2023-02-01 | Discharge: 2023-02-01 | Disposition: A | Discharge status: Home / Self Care | Payer: Medicare Other | Source: Ambulatory Visit | Attending: Oncology | Admitting: Oncology

## 2023-02-01 DIAGNOSIS — R918 Other nonspecific abnormal finding of lung field: Secondary | ICD-10-CM | POA: Insufficient documentation

## 2023-02-01 DIAGNOSIS — I700 Atherosclerosis of aorta: Secondary | ICD-10-CM | POA: Insufficient documentation

## 2023-02-01 DIAGNOSIS — Z85048 Personal history of other malignant neoplasm of rectum, rectosigmoid junction, and anus: Secondary | ICD-10-CM | POA: Insufficient documentation

## 2023-02-01 LAB — GLUCOSE, CAPILLARY: Glucose-Capillary: 76 mg/dL (ref 70–99)

## 2023-02-01 MED ORDER — FLUDEOXYGLUCOSE F - 18 (FDG) INJECTION
8.7000 | Freq: Once | INTRAVENOUS | Status: AC | PRN
  Administered 2023-02-01: 8.87 via INTRAVENOUS

## 2023-02-02 ENCOUNTER — Ambulatory Visit: Payer: No Typology Code available for payment source

## 2023-02-03 ENCOUNTER — Inpatient Hospital Stay: Admission: RE | Admit: 2023-02-03 | Payer: No Typology Code available for payment source | Source: Ambulatory Visit

## 2023-02-03 ENCOUNTER — Telehealth: Payer: Self-pay | Admitting: Cardiovascular Disease

## 2023-02-03 NOTE — Telephone Encounter (Signed)
   Name: BODEE LAFOE  DOB: [birth year]  MRN: [id number]  Primary Cardiologist: Lorine Bears, MD  Chart reviewed as part of pre-operative protocol coverage. The patient has an upcoming visit scheduled with Dr. Kirke Corin on at which time clearance can be addressed in case there are any issues that would impact surgical recommendations. I added preop FYI to appointment note so that provider is aware to address at time of outpatient visit.  Per office protocol the cardiology provider should forward their finalized clearance decision and recommendations regarding antiplatelet therapy to the requesting party below.    I will route this message as FYI to requesting party and remove this message from the preop box as separate preop APP input not needed at this time.   Please call with any questions.  Napoleon Form, Leodis Rains, NP  02/03/2023, 5:36 PM

## 2023-02-03 NOTE — Telephone Encounter (Signed)
   Pre-operative Risk Assessment    Patient Name: Curtis Campos  DOB: [birth year] MRN: [id number]      Request for Surgical Clearance    Procedure:  EBUS/Robotic Bronchoscopy  Date of Surgery:  Clearance 03/03/23                                 Surgeon:  [name] Surgeon's Group or Practice Name:  [facility] Phone number:  [phone] Fax number:  [phone]   Type of Clearance Requested:  pharmacy, aspirin 81mg     Type of Anesthesia:  General    Additional requests/questions:    Queen Slough   02/03/2023, 4:33 PM

## 2023-02-08 ENCOUNTER — Other Ambulatory Visit: Payer: Self-pay | Admitting: *Deleted

## 2023-02-08 MED ORDER — HYDROCOD POLI-CHLORPHE POLI ER 10-8 MG/5ML PO SUER: 5.0000 mL | Freq: Two times a day (BID) | ORAL | 0 refills | Status: DC | PRN

## 2023-02-15 ENCOUNTER — Other Ambulatory Visit: Payer: Self-pay | Admitting: Pulmonary Disease

## 2023-02-15 DIAGNOSIS — R918 Other nonspecific abnormal finding of lung field: Secondary | ICD-10-CM

## 2023-02-22 ENCOUNTER — Encounter
Admission: RE | Admit: 2023-02-22 | Discharge: 2023-02-22 | Disposition: A | Discharge status: Home / Self Care | Payer: No Typology Code available for payment source | Source: Ambulatory Visit | Attending: Pulmonary Disease | Admitting: Pulmonary Disease

## 2023-02-22 ENCOUNTER — Other Ambulatory Visit: Payer: Self-pay | Admitting: Pulmonary Disease

## 2023-02-22 VITALS — Ht 66.0 in | Wt 167.1 lb

## 2023-02-22 DIAGNOSIS — I350 Nonrheumatic aortic (valve) stenosis: Secondary | ICD-10-CM

## 2023-02-22 DIAGNOSIS — Z01812 Encounter for preprocedural laboratory examination: Secondary | ICD-10-CM

## 2023-02-22 DIAGNOSIS — Z952 Presence of prosthetic heart valve: Secondary | ICD-10-CM

## 2023-02-22 DIAGNOSIS — I2510 Atherosclerotic heart disease of native coronary artery without angina pectoris: Secondary | ICD-10-CM

## 2023-02-22 DIAGNOSIS — I5022 Chronic systolic (congestive) heart failure: Secondary | ICD-10-CM

## 2023-02-22 DIAGNOSIS — Z79899 Other long term (current) drug therapy: Secondary | ICD-10-CM

## 2023-02-22 DIAGNOSIS — I10 Essential (primary) hypertension: Secondary | ICD-10-CM

## 2023-02-22 HISTORY — DX: Nonrheumatic aortic (valve) stenosis: I35.0

## 2023-02-22 HISTORY — DX: Other supraventricular tachycardia: I47.19

## 2023-02-22 HISTORY — DX: Colostomy status: Z93.3

## 2023-02-22 HISTORY — DX: Pneumonia, unspecified organism: J18.9

## 2023-02-22 HISTORY — DX: Anemia, unspecified: D64.9

## 2023-02-22 HISTORY — DX: Hypo-osmolality and hyponatremia: E87.1

## 2023-02-22 HISTORY — DX: Other nonspecific abnormal finding of lung field: R91.8

## 2023-02-22 HISTORY — DX: Personal history of nicotine dependence: Z87.891

## 2023-02-22 HISTORY — DX: Atherosclerosis of aorta: I70.0

## 2023-02-22 HISTORY — DX: Dependence on supplemental oxygen: Z99.81

## 2023-02-22 HISTORY — DX: Hypokalemia: E87.6

## 2023-02-22 HISTORY — DX: Prediabetes: R73.03

## 2023-02-22 NOTE — Patient Instructions (Addendum)
Your procedure is scheduled on:03-03-23 Wednesday Report to the Registration Desk on the 1st floor of the Medical Mall.Then proceed to the 2nd floor Surgery Desk To find out your arrival time, please call [phone] between 1PM - 3PM on:03-02-23 Tuesday If your arrival time is 6:00 am, do not arrive before that time as the Medical Mall entrance doors do not open until 6:00 am.  REMEMBER: Instructions that are not followed completely may result in serious medical risk, up to and including death; or upon the discretion of your surgeon and anesthesiologist your surgery may need to be rescheduled.  Do not eat food after midnight the night before surgery.  No gum chewing or hard candies.  You may however, drink CLEAR liquids up to 2 hours before you are scheduled to arrive for your surgery. Do not drink anything within 2 hours of your scheduled arrival time.  Clear liquids include: - water  - apple juice without pulp - gatorade (not RED colors) - black coffee or tea (Do NOT add milk or creamers to the coffee or tea) Do NOT drink anything that is not on this list.  One week prior to surgery: Stop Anti-inflammatories (NSAIDS) such as Advil, Aleve, Ibuprofen, Motrin, Naproxen, Naprosyn and Aspirin based products such as Excedrin, Goody's Powder, BC Powder. Stop ANY OVER THE COUNTER supplements until after surgery (vitamin D3 and Magnesium Oxide)  You may however, continue to take Tylenol if needed for pain up until the day of surgery.  Dr Karna Christmas wants you to stop your 81 mg Aspirin 7 days prior to surgery-Last dose will be on 02-23-23 (Tuesday)-Make sure Dr Kirke Corin is ok with this when you go for your clearance appointment on 02-23-23 @ 2:40 pm  Continue taking all of your other prescription medications up until the day of surgery.  ON THE DAY OF SURGERY ONLY TAKE THESE MEDICATIONS WITH SIPS OF WATER: -carvedilol (COREG)   Use your Albuterol Nebulizer and Advair Inhaler the day of surgery  and bring your Albuterol Inhaler to the hospital  No Alcohol for 24 hours before or after surgery.  No Smoking including e-cigarettes for 24 hours before surgery.  No chewable tobacco products for at least 6 hours before surgery.  No nicotine patches on the day of surgery.  Do not use any "recreational" drugs for at least a week (preferably 2 weeks) before your surgery.  Please be advised that the combination of cocaine and anesthesia may have negative outcomes, up to and including death. If you test positive for cocaine, your surgery will be cancelled.  On the morning of surgery brush your teeth with toothpaste and water, you may rinse your mouth with mouthwash if you wish. Do not swallow any toothpaste or mouthwash.  Do not wear jewelry, make-up, hairpins, clips or nail polish.  For welded (permanent) jewelry: bracelets, anklets, waist bands, etc.  Please have this removed prior to surgery.  If it is not removed, there is a chance that hospital personnel will need to cut it off on the day of surgery.  Do not wear lotions, powders, or perfumes.   Do not shave body hair from the neck down 48 hours before surgery.  Contact lenses, hearing aids and dentures may not be worn into surgery.  Do not bring valuables to the hospital. [facility] is not responsible for any missing/lost belongings or valuables.   Notify your doctor if there is any change in your medical condition (cold, fever, infection).  Wear comfortable clothing (specific to  your surgery type) to the hospital.  After surgery, you can help prevent lung complications by doing breathing exercises.  Take deep breaths and cough every 1-2 hours. Your doctor may order a device called an Incentive Spirometer to help you take deep breaths. When coughing or sneezing, hold a pillow firmly against your incision with both hands. This is called "splinting." Doing this helps protect your incision. It also decreases belly discomfort.  If  you are being admitted to the hospital overnight, leave your suitcase in the car. After surgery it may be brought to your room.  In case of increased patient census, it may be necessary for you, the patient, to continue your postoperative care in the Same Day Surgery department.  If you are being discharged the day of surgery, you will not be allowed to drive home. You will need a responsible individual to drive you home and stay with you for 24 hours after surgery.   If you are taking public transportation, you will need to have a responsible individual with you.  Please call the Pre-admissions Testing Dept. at [phone] if you have any questions about these instructions.  Surgery Visitation Policy:  Patients having surgery or a procedure may have two visitors.  Children under the age of 71 must have an adult with them who is not the patient.

## 2023-02-23 ENCOUNTER — Ambulatory Visit: Payer: Medicare Other | Attending: Cardiovascular Disease | Admitting: Cardiovascular Disease

## 2023-02-23 ENCOUNTER — Other Ambulatory Visit: Payer: No Typology Code available for payment source

## 2023-02-23 ENCOUNTER — Encounter: Payer: Self-pay | Admitting: Urgent Care

## 2023-02-23 ENCOUNTER — Encounter: Payer: Self-pay | Admitting: Cardiovascular Disease

## 2023-02-23 ENCOUNTER — Encounter
Admission: RE | Admit: 2023-02-23 | Discharge: 2023-02-23 | Disposition: A | Discharge status: Home / Self Care | Payer: Medicare Other | Source: Ambulatory Visit | Attending: Pulmonary Disease | Admitting: Pulmonary Disease

## 2023-02-23 VITALS — BP 86/44 | HR 70 | Ht 66.0 in | Wt 169.0 lb

## 2023-02-23 DIAGNOSIS — Z952 Presence of prosthetic heart valve: Secondary | ICD-10-CM | POA: Insufficient documentation

## 2023-02-23 DIAGNOSIS — E785 Hyperlipidemia, unspecified: Secondary | ICD-10-CM | POA: Diagnosis not present

## 2023-02-23 DIAGNOSIS — Z01812 Encounter for preprocedural laboratory examination: Secondary | ICD-10-CM | POA: Insufficient documentation

## 2023-02-23 DIAGNOSIS — I110 Hypertensive heart disease with heart failure: Secondary | ICD-10-CM | POA: Diagnosis not present

## 2023-02-23 DIAGNOSIS — I10 Essential (primary) hypertension: Secondary | ICD-10-CM | POA: Diagnosis not present

## 2023-02-23 DIAGNOSIS — I2510 Atherosclerotic heart disease of native coronary artery without angina pectoris: Secondary | ICD-10-CM | POA: Insufficient documentation

## 2023-02-23 DIAGNOSIS — I350 Nonrheumatic aortic (valve) stenosis: Secondary | ICD-10-CM | POA: Diagnosis not present

## 2023-02-23 DIAGNOSIS — I5022 Chronic systolic (congestive) heart failure: Secondary | ICD-10-CM

## 2023-02-23 DIAGNOSIS — Z79899 Other long term (current) drug therapy: Secondary | ICD-10-CM | POA: Diagnosis not present

## 2023-02-23 LAB — BASIC METABOLIC PANEL
Anion gap: 9 (ref 5–15)
BUN: 17 mg/dL (ref 8–23)
CO2: 29 mmol/L (ref 22–32)
Calcium: 8.5 mg/dL — ABNORMAL LOW (ref 8.9–10.3)
Chloride: 93 mmol/L — ABNORMAL LOW (ref 98–111)
Creatinine, Ser: 0.7 mg/dL (ref 0.61–1.24)
GFR, Estimated: >60 mL/min (ref >60)
Glucose, Bld: 199 mg/dL — ABNORMAL HIGH (ref 70–99)
Potassium: 4.3 mmol/L (ref 3.5–5.1)
Sodium: 131 mmol/L — ABNORMAL LOW (ref 135–145)

## 2023-02-23 MED ORDER — SACUBITRIL-VALSARTAN 49-51 MG PO TABS: ORAL_TABLET | ORAL | 1 refills | Status: DC

## 2023-02-23 NOTE — Patient Instructions (Signed)
Medication Instructions:  DECREASE the Entresto to half of the 49-51 mg twice daily  Hold the Aspirin one week prior to the proceudre  *If you need a refill on your cardiac medications before your next appointment, please call your pharmacy*   Lab Work: None ordered If you have labs (blood work) drawn today and your tests are completely normal, you will receive your results only by: MyChart Message (if you have MyChart) OR A paper copy in the mail If you have any lab test that is abnormal or we need to change your treatment, we will call you to review the results.   Testing/Procedures: None ordered   Follow-Up: At [facility], you and your health needs are our priority.  As part of our continuing mission to provide you with exceptional heart care, we have created designated Provider Care Teams.  These Care Teams include your primary Cardiologist (physician) and Advanced Practice Providers (APPs -  Physician Assistants and Nurse Practitioners) who all work together to provide you with the care you need, when you need it.  We recommend signing up for the patient portal called "MyChart".  Sign up information is provided on this After Visit Summary.  MyChart is used to connect with patients for Virtual Visits (Telemedicine).  Patients are able to view lab/test results, encounter notes, upcoming appointments, etc.  Non-urgent messages can be sent to your provider as well.   To learn more about what you can do with MyChart, go to [email or web address].    Your next appointment:   4 month(s)  Provider:   You may see Lorine Bears, MD or one of the following Advanced Practice Providers on your designated Care Team:   Nicolasa Ducking, NP Eula Listen, PA-C Cadence Fransico Michael, PA-C Charlsie Quest, NP Carlos Levering, NP

## 2023-02-23 NOTE — Progress Notes (Unsigned)
Cardiology Office Note   Date:  02/24/2023   ID:  Curtis, Campos 17-Nov-1940, MRN [id number]  PCP:  Danella Penton, MD  Cardiologist:   Lorine Bears, MD   Chief Complaint  Patient presents with   Follow-up    Pre op clearance no complaints today. Meds reviewed verbally with pt.       History of Present Illness: Curtis Campos is a 82 y.o. male who presents for a follow-up visit regarding chronic systolic heart failure, coronary artery disease, and aortic stenosis status post TAVR in August 2023.   He has known history of hyperlipidemia with intolerance to statins, hypertension, previous rectal cancer, previous tobacco use and COPD.  Echocardiogram in 2015 showed an EF of 25% with moderate mitral regurgitation A right and left cardiac catheterization showed severe one-vessel coronary artery disease with a subtotal occlusion of the mid left circumflex which appeared to be chronic.   He had progression of aortic stenosis to the severe range last year.  Cardiac catheterization showed no change in coronary anatomy and subsequently underwent TAVR in August 2023.  Most recent echocardiogram in September showed an EF of 50%, mild pulmonary hypertension, mild to moderate mitral regurgitation and normal functioning TAVR prosthesis with mean gradient of 8 mmHg and trivial aortic insufficiency.  He had issues with low blood pressure requiring decreasing his heart failure medications.  He was recently diagnosed with mass of the lower lobe of the left lung.  He is scheduled for bronchoscopy and likely biopsy next week.  He denies chest pain.  He has chronic exertional dyspnea.  His lower extremity edema improved.  Past Medical History:  Diagnosis Date   Agent orange exposure    Anemia    Aortic atherosclerosis (HCC)    Aortic stenosis    Arthritis 03/12/2011   CHF (congestive heart failure) (HCC)    Chronic hyponatremia    Chronic obstructive lung disease (HCC)    Colostomy in  place [facility])    Coronary artery disease 03/2013   a. 03/2013 Cath: LM nl, LAD 68m, D1 50, D2 30, LCX 23m/d (chronic), OM1 50, RCA 40p, 82m, RPL 40, RPDA 60, EF 25%, mod-sev MR-->Med mgmt; b. 08/2019 MV: basal inflat/mid inflat defect consistent w/ ischemia in LCX distribution. EF 37%. Intermediate risk.   Dependence on nocturnal oxygen therapy    4 L   Former smoker    HFrEF (heart failure with reduced ejection fraction) (HCC)    a. 03/2013 TEE: EF 25%; b. 08/2017 Echo: EF 45-50%, Gr1 DD. Mod AS. Mod MR. Mildly dil LA; c. 06/2019 Echo: EF 30-35%, glob HK, gr1 DD; d. 06/2020 Echo: EF 55-60%, nl RV fxn, triv MR, Mod AS.   Hyperlipidemia    Hypertension    Hypokalemia    Ischemic cardiomyopathy    a. 03/2013 TEE: EF 25%; b. 08/2017 Echo: EF 45-50%; c. 06/2019 Echo: EF 30-35%; d. 06/2020 Echo: EF 55-60%.   Mass of lung    Moderate aortic stenosis    a. 08/2017 Echo: Mod AS. Mean grad (S) . Valve area (VTI): 1.25cm^2; b. 06/2019 Echo: Mod AS. Mean grad , Valve area (VTI): 1.16cm^2); c. 06/2020 Mod AS. AVA 1.25cm^2, mean grad .   Moderate mitral regurgitation    a. 08/2017 Echo: mod MR; b. 06/2019 Echo: mild to mod MR.   Multifocal atrial tachycardia (HCC)    Myocardial infarction [facility])    pt reports ~18 years ago   Pneumonia    Pre-diabetes  Rectal cancer (HCC)    Requires continuous at home supplemental oxygen    S/P TAVR (transcatheter aortic valve replacement) 11/25/2021   s/p TAVR with a 26mm Edwards S3UR via the TF approach by Dr. Clifton James & Dr. Laneta Simmers    Past Surgical History:  Procedure Laterality Date   APPENDECTOMY  06/29/2003   CARDIAC CATHETERIZATION  03/30/2013   [facility]   cataract surgery Bilateral    COLOSTOMY     HERNIA REPAIR     INTRAOPERATIVE TRANSTHORACIC ECHOCARDIOGRAM N/A 11/25/2021   Procedure: INTRAOPERATIVE TRANSTHORACIC ECHOCARDIOGRAM;  Surgeon: Kathleene Hazel, MD;  Location: [facility] OR;  Service: Open Heart Surgery;  Laterality: N/A;   resection of  rectum     RIGHT/LEFT HEART CATH AND CORONARY ANGIOGRAPHY N/A 08/27/2021   Procedure: RIGHT/LEFT HEART CATH AND CORONARY ANGIOGRAPHY;  Surgeon: Tonny Bollman, MD;  Location: [facility] INVASIVE CV LAB;  Service: Cardiovascular;  Laterality: N/A;   TRANSCATHETER AORTIC VALVE REPLACEMENT, TRANSFEMORAL Left 11/25/2021   Procedure: Transcatheter Aortic Valve Replacement, Transfemoral using a 26 MM Edwards SAPIEN 3 Ultra.;  Surgeon: Kathleene Hazel, MD;  Location: [facility] OR;  Service: Open Heart Surgery;  Laterality: Left;     Current Outpatient Medications  Medication Sig Dispense Refill   albuterol (PROVENTIL) (2.5 MG/3ML) 0.083% nebulizer solution Take 2.5 mg by nebulization at bedtime.     albuterol (VENTOLIN HFA) 108 (90 Base) MCG/ACT inhaler INHALE 2 PUFFS BY MOUTH EVERY 6 HOURS AS NEEDED FOR SHORTNESS OF BREATH (Patient taking differently: 1-2 puffs every 6 (six) hours as needed. INHALE 2 PUFFS BY MOUTH EVERY 6 HOURS AS NEEDED FOR SHORTNESS OF BREATH) 18 g 0   Alirocumab (PRALUENT) 75 MG/ML SOAJ Inject 1 pen into the skin every 14 (fourteen) days. 2 pen 11   amoxicillin (AMOXIL) 500 MG tablet Take 4 tablets (2,000 mg total) by mouth as directed. 1 HOUR PRIOR TO DENTAL APPOINTMENTS 12 tablet 6   aspirin 81 MG tablet Take 81 mg by mouth at bedtime.     carvedilol (COREG) 6.25 MG tablet Take 6.25 mg by mouth 2 (two) times daily with a meal.     chlorpheniramine-HYDROcodone (TUSSIONEX) 10-8 MG/5ML Take 5 mLs by mouth every 12 (twelve) hours as needed for cough. 115 mL 0   cholecalciferol (VITAMIN D3) 25 MCG (1000 UNIT) tablet Take 1,000 Units by mouth daily.     dapagliflozin propanediol (FARXIGA) 10 MG TABS tablet TAKE ONE TABLET BY MOUTH EVERY MORNING BEFORE BREAKFAST (Patient taking differently: Take 10 mg by mouth daily with breakfast.) 30 tablet 1   diclofenac Sodium (VOLTAREN) 1 % GEL 2 g as needed.     ezetimibe (ZETIA) 10 MG tablet Take 1 tablet by mouth once daily for cholesterol. NEED  APPOINTMENT FOR ANY MORE REFILLS (Patient taking differently: Take 10 mg by mouth at bedtime. Take 1 tablet by mouth once daily for cholesterol. NEED APPOINTMENT FOR ANY MORE REFILLS) 90 tablet 1   fluticasone-salmeterol (ADVAIR) 250-50 MCG/ACT AEPB Inhale 1 puff into the lungs in the morning and at bedtime.     Magnesium Oxide 400 (240 Mg) MG TABS TAKE 1 TABLET BY MOUTH ONCE DAILY. (Patient taking differently: Take 400 mg by mouth daily.) 90 tablet 3   OXYGEN Inhale 4 L into the lungs continuous.     sodium chloride 1 g tablet Take 1 g by mouth daily with lunch.     spironolactone (ALDACTONE) 25 MG tablet Take 0.5 tablets (12.5 mg total) by mouth daily. (Patient taking differently: Take 12.5  mg by mouth every morning.) 45 tablet 3   torsemide (DEMADEX) 20 MG tablet Take 20 mg by mouth daily.     traZODone (DESYREL) 50 MG tablet Take 1 tablet (50 mg total) by mouth at bedtime as needed. for sleep 90 tablet 0   TRELEGY ELLIPTA 100-62.5-25 MCG/ACT AEPB Inhale 1 puff into the lungs as needed.     sacubitril-valsartan (ENTRESTO) 49-51 MG Take half a tablet twice daily 90 tablet 1   No current facility-administered medications for this visit.    Allergies:   Codeine and Statins    Social History:  The patient  reports that he quit smoking about 9 years ago. His smoking use included cigarettes. He started smoking about 59 years ago. He has a 50 pack-year smoking history. He has never used smokeless tobacco. He reports that he does not drink alcohol and does not use drugs.   Family History:  The patient's family history includes Cancer - Cervical in his sister; Heart attack (age of onset: 50) in his brother; Heart disease in his father, mother, sister, and sister; Hyperlipidemia in his father; Hypertension in his father.    ROS:  Please see the history of present illness.   Otherwise, review of systems are positive for none.   All other systems are reviewed and negative.    PHYSICAL EXAM: VS:  BP  (!) 86/44 (BP Location: Left Arm, Patient Position: Sitting, Cuff Size: Normal)   Pulse 70   Ht 5\' 6"  (1.676 m)   Wt 169 lb (76.7 kg)   SpO2 97%   BMI 27.28 kg/m  , BMI Body mass index is 27.28 kg/m. GEN: Well nourished, well developed, in no acute distress  HEENT: normal  Neck: no JVD, carotid bruits, or masses Cardiac: RRR with premature beats; rubs, or gallops,no edema .  1 out of 6 systolic murmur in the aortic area.  There is trace bilateral leg edema Respiratory:  clear to auscultation bilaterally, normal work of breathing GI: soft, nontender, nondistended, + BS MS: no deformity or atrophy  Skin: warm and dry, no rash Neuro:  Strength and sensation are intact Psych: euthymic mood, full affect   EKG:  EKG is  ordered today. EKG showed : Sinus rhythm with marked sinus arrhythmia Left axis deviation Possible Anterior infarct (cited on or before 05-Oct-2022) When compared with ECG of 18-Jan-2023 16:45, Fusion complexes are no longer Present Premature ventricular complexes are no longer Present QRS duration has increased      Recent Labs: 01/18/2023: B Natriuretic Peptide 191.0 01/26/2023: ALT 12; Hemoglobin 9.7; Platelet Count 367 02/23/2023: BUN 17; Creatinine, Ser 0.70; Potassium 4.3; Sodium 131    Lipid Panel    Component Value Date/Time   CHOL 185 02/12/2020 1454   TRIG 128.0 02/12/2020 1454   HDL 53.50 02/12/2020 1454   CHOLHDL 3 02/12/2020 1454   VLDL 25.6 02/12/2020 1454   LDLCALC 106 (H) 02/12/2020 1454   LDLDIRECT 129.7 04/14/2012 0756      Wt Readings from Last 3 Encounters:  02/23/23 169 lb (76.7 kg)  02/22/23 167 lb 1.7 oz (75.8 kg)  01/26/23 167 lb (75.8 kg)         ASSESSMENT AND PLAN:  1. Chronic systolic heart failure: Most recent echocardiogram  showed an EF of 45 to 50% He appears to be euvolemic on torsemide 20 mg once daily.  Continue Entresto, carvedilol , Farxiga and spironolactone. Given that his blood pressure is low, I  elected to decrease Entresto by  half.   2. Coronary artery disease involving native coronary arteries without angina: He has known chronic subtotal occlusion of the mid left circumflex.  Currently with no anginal symptoms.  Continue medical therapy.   3. Essential hypertension: Blood pressure is well controlled.   4. Hyperlipidemia: He has history of intolerance to statins and currently is on Praluent and Zetia.    5.  Status post TAVR for severe aortic stenosis.  Most recent echocardiogram in August showed normal functioning TAVR prosthesis.  6.  Preoperative cardiovascular evaluation for bronchoscopy and possible biopsy: The patient is stable from a cardiac standpoint and does not require further cardiac workup.  Aspirin can be held 7 days before the procedure.   Disposition: Follow-up in 6 months.  Signed,  Lorine Bears, MD  02/24/2023 8:31 AM    [facility]

## 2023-02-24 ENCOUNTER — Encounter: Payer: Self-pay | Admitting: Urgent Care

## 2023-02-24 ENCOUNTER — Other Ambulatory Visit: Payer: Self-pay | Admitting: Cardiovascular Disease

## 2023-02-24 NOTE — Progress Notes (Signed)
Perioperative / Anesthesia Services  Pre-Admission Testing Clinical Review / Pre-Operative Anesthesia Consult  Date: 03/02/23  Patient Demographics:  Name: Curtis Campos DOB:   [birth year] MRN:   [id number]  Planned Surgical Procedure(s):    Case: [id number] Date/Time: 03/03/23 1203   Procedures:      VIDEO BRONCHOSCOPY WITH ENDOBRONCHIAL ULTRASOUND     ROBOTIC ASSISTED NAVIGATIONAL BRONCHOSCOPY   Anesthesia type: General   Pre-op diagnosis: R91.8 Lung Mass   Location: [facility] PROCEDURE RM 02 / [facility] ORS FOR ANESTHESIA GROUP   Surgeons: [name], MD     NOTE: Available PAT nursing documentation and vital signs have been reviewed. Clinical nursing staff has updated patient's PMH/PSHx, current medication list, and drug allergies/intolerances to ensure comprehensive history available to assist in medical decision making as it pertains to the aforementioned surgical procedure and anticipated anesthetic course. Extensive review of available clinical information personally performed. [place] PMH and PSHx updated with any diagnoses/procedures that  may have been inadvertently omitted during his intake with the pre-admission testing department's nursing staff.  Clinical Discussion:  Curtis Campos is a 82 y.o. male who is submitted for pre-surgical anesthesia review and clearance prior to him undergoing the above procedure.Patient is a Former Smoker (50 pack years; quit 02/2013). Pertinent PMH includes: CAD, silent MI, ischemic cardiomyopathy, HFrEF, aortic stenosis (s/p TAVR), cardiomegaly, multifocal atrial tachycardial, aortic atherosclerosis, chronic cerebral microvascular disease, HTN, HLD, pre-diabetes, pulmonary hypertension, COPD (on supplemental oxygen), agent orange exposure, LUL pulmonary mass, anemia, OA, remote rectal cancer (has colostomy), anxiety.   Patient is followed by cardiology Kirke Corin, MD). He was last seen in the cardiology clinic on 02/23/2023; notes reviewed. At  the time of his clinic visit, patient with complaints of chronic exertional dyspnea related to his underlying COPD diagnosis and newly diagnosed pulmonary mass. Respiratory status reported to be overall stable and at baseline. Patient also with chronic peripheral edema that cardiologist noted had improved from previous visits. Patient denied any chest pain,  PND, orthopnea, palpitations, weakness, fatigue, vertiginous symptoms, or presyncope/syncope. Patient with a past medical history significant for cardiovascular diagnoses. Documented physical exam was grossly benign, providing no evidence of acute exacerbation and/or decompensation of the patient's known cardiovascular conditions.  Patient reported to have suffered a "silent MI" during a hospital admission in January 2015 for chest pain, community-acquired pneumonia, and new onset CHF.    TTE performed on 04/08/2013 showed severe LV dysfunction with an EF of 20-25%.  Segmental wall motion abnormalities. Left ventricular diastolic Doppler parameters consistent with abnormal relaxation (G1DD).  Left atrium was mildly enlarged. There was severe mitral valve regurgitation (posteriorly directed jet).  RVSP moderately to severely elevated at 62.2 mmHg consistent with pulmonary hypertension.  Patient was diagnosed with an ischemic cardiomyopathy   Diagnostic RIGHT/LEFT heart catheterization was performed on 04/10/2013 revealing multivessel CAD; 10% mid LAD, 50% D1, 30% D2, 99% mid LCx, 50% OM1, 40% proximal RCA, 30% mid RCA, 60% RPDA, and 40% RPLS.  Global left ventricular function severely depressed with an EF of 25%.  Plans were for medical management of patient's coronary artery disease.  Hemodynamics:: mean RA = 8 mmHg, mean PA = 30 mmHg, mean PCWP = 23 mmHg, AO saturation = 88%, PA saturation = 66%, CO = 5.69 L/min, CI 3.12 L/min/m, and LVEDP = 20 mmHg.  Myocardial perfusion imaging study was performed on 08/31/2019 revealing a moderate to severe  reduction in the patient's left ventricular systolic function with an EF of 37%.  Stress imaging demonstrated  a medium perfusion defect of moderate severity in the basal inferolateral and mid inferolateral regions.  There was downsloping ST segment depression in leads V6 and lead II with T wave inversions noted in lead I, lead II, lead III, aVF V6.  Findings consistent with ischemia in the LCx distribution.  Study was determined to be intermediate risk.  Repeat RIGHT/LEFT diagnostic heart catheterization was performed on 08/27/2021 revealing multivessel CAD; 50% D1, 50% D2, 90% mid LCx, 40% proximal-mid RCA, and 80% RPDA.  There was severe low-flow low gradient aortic stenosis with a mean transvalvular gradient of 26 mmHg; AVA (VTI) 0.69 cm.  Decision was made to treat coronary artery disease medically and to refer patient for consideration of aortic valve replacement.  Hemodynamics: mean RA = 3 mmHg, mean PA = 22 mmHg, mean PCWP = 4 mmHg, CO 6.03 L/min, CI = 3.2 L/min/m, and LVEDP = 9 mmHg.  As part of patient's evaluation for potential TAVR, he underwent coronary CTA on 10/15/2021.  Aortic valve was trileaflet and noted to be heavily calcified with a calcium score of 2208.  Patient was deemed appropriate candidate for TAVR, which he ultimately underwent on 11/25/2021, at which time a 26 mm Edwards SAPIEN 3 Ultra Resilia bioprosthetic valve was placed.  Patient's ischemic cardiomyopathy with resulting HFrEF has been monitored since time of diagnosis in 2015.  Most recent TTE was performed on 11/18/2022 revealing a mildly reduced left ventricular systolic function with an EF of 45-50%.  There was global hypokinesis. Left ventricular diastolic Doppler parameters consistent with abnormal relaxation (G1DD).  Right ventricular size and function normal.  RVSP 29.8 mmHg.  Bioprosthetic aortic valve well-seated and functioning properly.  Mean transaortic valve gradient 6.0 mmHg; AVA (VTI) = 3.79 cm.  Ischemic  cardiomyopathy with resulting HFrEF being managed on low-dose beta-blocker (carvedilol), SGLT2i (dapagliflozin), ARB/ARNI (Entresto), and diuretic (for spironolactone + torsemide) therapies. Blood pressure noted to be low at his visit with cardiology; documented at 86/44 mmHg.  Patient is intolerant of statins, thus his HLD diagnosis and further ASCVD prevention is being managed with ezetimibe + PCSK9i (alirocumab).  Patient has a prediabetes diagnosis.  His last hemoglobin A1c was 5.9% when checked on 05/20/2022.  Functional capacity limited by patient's age and multiple medical comorbidities.  He is also on continuous supplemental oxygen (4L/[place]).  Due to his breathing, patient experiences some difficulties with completing his ADLs/IADLs.  Patient is questionably able to achieve 4 METS of physical activity without experiencing, at least to some degree, significant angina/anginal equivalent symptoms.  Due to hypotension in the clinic, Entresto dose was decreased by half.  No other changes were made to his medication regimen.  Patient to follow-up with outpatient cardiology in 6 months or sooner if needed.  SHAKA HUNEKE found to have a 6.1 mm mass in the LEFT upper lobe on CT imaging of his chest back on 03/10/2021.  Repeat CT imaging performed on 10/15/2021 revealed interval increase in size to 8 mm.  Subsequent PET/CT performed on 11/14/2021 revealed the 8 mm mass had low-level hypermetabolic activity with a max SUV of 2.8.  Surveillance was recommended at that time.  Diagnostic radiographs of the chest performed on 10/05/2022 revealed significant increase in size of the mass in the LEFT upper lobe to 2.7 cm.  Follow-up chest radiograph on 01/18/2023 revealed further interval increase to 3.7 cm.  CT imaging of the chest performed on 01/22/2023 demonstrated a 3.8 x 3.2 cm pulmonary mass.  PET/CT was repeated on 02/01/2023, which  revealed that pulmonary mass was markedly hypermetabolic with a maximum SUV of 43.   There was no associated hypermetabolic hilar or mediastinal lymphadenopathy.  Patient was referred to pulmonary medicine for further evaluation and consideration of tissue biopsy for definitive diagnosis.  Patient has now been scheduled for a VIDEO BRONCHOSCOPY WITH ENDOBRONCHIAL ULTRASOUND; ROBOTIC ASSISTED NAVIGATIONAL BRONCHOSCOPY on 07/18/2022 with Dr. [name], MD.  Given patient's past medical history significant for cardiovascular diagnoses, presurgical cardiac clearance was sought by the PAT team.  Per cardiology, "the patient is stable from a cardiac standpoint and does not require further cardiac workup prior to bronchoscopy.  Patient may proceed at an overall ACCEPTABLE risk".  In review of his medication reconciliation, it is noted that patient is currently on prescribed daily antithrombotic therapy. He has been instructed on recommendations for holding his daily low dose ASA for 7 days prior to his procedure with plans to restart as soon as postoperative bleeding risk felt to be minimized by his attending surgeon. The patient has been instructed that his last dose of his ASA should be on 02/23/2023.  Patient denies previous perioperative complications with anesthesia in the past. In review of the available records, it is noted that patient underwent a MAC anesthetic course at [facility] (ASA IV) in 10/2021 without documented complications.      02/23/2023    2:26 PM 02/22/2023    8:00 AM 01/26/2023    1:32 PM  Vitals with BMI  Height 5\' 6"  5\' 6"    Weight 169 lbs 167 lbs 2 oz   BMI 27.29 26.98   Systolic 86  84  Diastolic 44  58  Pulse 70      Providers/Specialists:   NOTE: Primary physician provider listed below. Patient may have been seen by APP or partner within same practice.   PROVIDER ROLE / SPECIALTY LAST Yolanda Manges, MD Pulmonary Medicine (Surgeon)  02/03/2023  Danella Penton, MD Primary Care Provider  01/20/2023  Lorine Bears, MD Cardiology   02/23/2023   Allergies:  Codeine and Statins  Current Home Medications:   No current facility-administered medications for this encounter.    albuterol (PROVENTIL) (2.5 MG/3ML) 0.083% nebulizer solution   albuterol (VENTOLIN HFA) 108 (90 Base) MCG/ACT inhaler   Alirocumab (PRALUENT) 75 MG/ML SOAJ   amoxicillin (AMOXIL) 500 MG tablet   aspirin 81 MG tablet   carvedilol (COREG) 6.25 MG tablet   chlorpheniramine-HYDROcodone (TUSSIONEX) 10-8 MG/5ML   cholecalciferol (VITAMIN D3) 25 MCG (1000 UNIT) tablet   dapagliflozin propanediol (FARXIGA) 10 MG TABS tablet   diclofenac Sodium (VOLTAREN) 1 % GEL   ezetimibe (ZETIA) 10 MG tablet   fluticasone-salmeterol (ADVAIR) 250-50 MCG/ACT AEPB   Magnesium Oxide 400 (240 Mg) MG TABS   OXYGEN   sacubitril-valsartan (ENTRESTO) 49-51 MG   sodium chloride 1 g tablet   spironolactone (ALDACTONE) 25 MG tablet   torsemide (DEMADEX) 20 MG tablet   traZODone (DESYREL) 50 MG tablet   TRELEGY ELLIPTA 100-62.5-25 MCG/ACT AEPB   History:   Past Medical History:  Diagnosis Date   Agent orange exposure    Anemia    Anxiety    Aortic atherosclerosis (HCC)    Aortic stenosis 07/04/2013   a.)TTE 07/04/13: mild (MPG 12; AVA 1.6); b.) TTE 01/01/16: mod (MPG 14; AVA 1.17); c.) TTE 08/30/17: mod (MPG 15; AVA 1.25); d.) TTE 07/13/19: mod (MPG 19; AVA 1.16); e.) TTE 07/23/20: mod (MPG 22; AVA 1.25); f.) TTE 03/12/2021: mod-sev (MPH 34; AVA 0.75);  g.) TTE 05/21/2021: mod (MPG 22; AVA 0.8); h.) RHC 08/27/2020: sev (MPG 26.18; AVA 0.69); i.) s/p TAVR 11/25/21: 26 mm Edwards S3 Ultra Resilia   Arthritis 03/12/2011   Cardiomegaly    Cerebral microvascular disease    Cholelithiasis    Chronic hyponatremia    Chronic obstructive lung disease (HCC)    Colostomy in place [facility])    a.) s/p resection secondary to rectal cancer   Coronary artery disease 03/2013   a.) LHC 04/10/2013: 10% mLAD, 50% D1, 30% D2, 99% mLCx, 50% OM1, 40% pRCA, 30% mRCA, 60% RPDA, 40% RPLS -  med mgmt; b.) MV 08/31/2019: basal inflat/mid inflat defect c/w ischemia in LCx distribution. EF 37%; c.) R/LHC 08/27/2020: 50% D1, 50% D2, 90% mLCx, 40% p-mRCA, 80% RPDA - med mgmt   Former smoker    HFrEF (heart failure with reduced ejection fraction) (HCC)    a.) TEE 04/13/13: EF 20-25, glob HK, G1DD, mild LAE, mod-sev MR; b.) TTE 08/30/17: EF 45-50, LVH, mild LAE, G1DD, mod AS, mod MR; c.) TTE 07/13/19: EF 30-35, glob HK, G1DD, RVSP 49.4, mild LAE, mild-mod MR, mod AS; d.) TTE 03/12/21: EF 55, G2DD, mild MR, mod-sev AS; e.) TTE 12/24/21: EF 50, mild LVH, G1DD, RVSP 40.5, mild-mod MR, triv AR; f.) TTE 11/18/22: EF 45-50, glob HK, G1DD, mild MR   History of bilateral cataract extraction    Hyperlipidemia    Hypertension    Hypokalemia    Ischemic cardiomyopathy    a.) TTE 04/08/2014: EF 25-30%; b.) TEE 04/13/2013: EF 20-25%; c.) TTE 07/04/2013: EF 30-35%; d.) TTE 01/01/2016: EF 40-45%; e.) TTE 08/30/2017: EF 45-50%; f.) TTE 07/03/2019: EF 30-35%; g.) MV 08/31/2019: EF 37%; h.) TTE 07/23/20: EF 55-60%; i.) TTE 03/12/2021: EF 55%; j.) TTE 05/21/2021: EF 40-45%; k.) TTE 11/26/2021: EF 45-50%; l.) TTE 12/24/2021: EF 50%; m.) TTE 11/18/2022: EF 45-50%   Long term (current) use of aspirin    Mass of upper lobe of left lung 03/10/2021   a.) CTA chest 03/10/2021: 6.1 mm; b.) CTA chest 10/15/2021: 8 mm; c.) PET CT 11/14/2021: 8 mm (SUV max 2.8); d.) CXR 10/05/2022: interval increase to 2.7 cm; e.) CXR 01/18/2023: interval increase to 3.7 cm; f.) CT chest 01/22/2023: 3.8 x 3.2 cm; g.) PET CT 02/01/2023: 3.8 cm and markedly hypermetabolic with SUV max of 43   Multifocal atrial tachycardia (HCC)    Myocardial infarction (HCC)    a.) suspected by cardiology during 03/2013 admission for CP/CAP/new onset CHF --> TTE  showed severe LV dysfunction with segmental wall motion abnormalities and severe MR (posteriorly-directed  jet)   Pneumonia    Pre-diabetes    Pulmonary hypertension (HCC)    a.) TTE 04/08/2013: RVSP  62.2; b.) TTE 07/13/2019: RVSP 49.4; c.) TTE 07/23/2020: RVSP 33.2; d.) TTE 11/26/2021: RVSP 41.2; e.) TTE 12/24/2021: RVSP 40.5; f.) TTE 11/18/2019: RVSP 29.8   Rectal cancer (HCC)    Requires continuous at home supplemental oxygen    a.) 4L/[place]   S/P TAVR (transcatheter aortic valve replacement) 11/25/2021   a.) 26mm Edwards Sapien 3 Ultra Resilia via the TF approach   Statin intolerance    Umbilical hernia (s/p repair)    Past Surgical History:  Procedure Laterality Date   APPENDECTOMY  06/29/2003   CATARACT EXTRACTION, BILATERAL Bilateral    COLOSTOMY     HERNIA REPAIR     INTRAOPERATIVE TRANSTHORACIC ECHOCARDIOGRAM N/A 11/25/2021   Procedure: INTRAOPERATIVE TRANSTHORACIC ECHOCARDIOGRAM;  Surgeon: Kathleene Hazel, MD;  Location: [facility] OR;  Service: Open Heart Surgery;  Laterality: N/A;   resection of rectum     RIGHT/LEFT HEART CATH AND CORONARY ANGIOGRAPHY Bilateral 04/10/2013   Procedure: RIGHT/LEFT HEART CATH AND CORONARY ANGIOGRAPHY; Location: [facility]; Surgeon: Lorine Bears, MD   RIGHT/LEFT HEART CATH AND CORONARY ANGIOGRAPHY N/A 08/27/2021   Procedure: RIGHT/LEFT HEART CATH AND CORONARY ANGIOGRAPHY;  Surgeon: Tonny Bollman, MD;  Location: [facility] INVASIVE CV LAB;  Service: Cardiovascular;  Laterality: N/A;   TRANSCATHETER AORTIC VALVE REPLACEMENT, TRANSFEMORAL Left 11/25/2021   Procedure: Transcatheter Aortic Valve Replacement, Transfemoral using a 26 MM Edwards SAPIEN 3 Ultra.;  Surgeon: Kathleene Hazel, MD;  Location: [facility] OR;  Service: Open Heart Surgery;  Laterality: Left;   Family History  Problem Relation Age of Onset   Heart disease Mother    Heart disease Father    Hyperlipidemia Father    Hypertension Father    Heart disease Sister        stents placed    Cancer - Cervical Sister    Heart disease Sister        CABG   Heart attack Brother 36       MI   Social History   Tobacco Use   Smoking status: Former    Current packs/day: 0.00    Average  packs/day: 1 pack/day for 50.0 years (50.0 ttl pk-yrs)    Types: Cigarettes    Start date: 03/26/1963    Quit date: 03/25/2013    Years since quitting: 9.9   Smokeless tobacco: Never  Vaping Use   Vaping status: Never Used  Substance Use Topics   Alcohol use: No    Alcohol/week: 0.0 standard drinks of alcohol   Drug use: No    Pertinent Clinical Results:  LABS:   Lab Results  Component Value Date   WBC 11.3 (H) 01/26/2023   HGB 9.7 (L) 01/26/2023   HCT 30.3 (L) 01/26/2023   MCV 85.8 01/26/2023   PLT 367 01/26/2023   No visits with results within 3 Day(s) from this visit.  Latest known visit with results is:  Hospital Outpatient Visit on 02/23/2023  Component Date Value Ref Range Status   Sodium 02/23/2023 131 (L)  135 - 145 mmol/L Final   Potassium 02/23/2023 4.3  3.5 - 5.1 mmol/L Final   Chloride 02/23/2023 93 (L)  98 - 111 mmol/L Final   CO2 02/23/2023 29  22 - 32 mmol/L Final   Glucose, Bld 02/23/2023 199 (H)  70 - 99 mg/dL Final   Glucose reference range applies only to samples taken after fasting for at least 8 hours.   BUN 02/23/2023 17  8 - 23 mg/dL Final   Creatinine, Ser 02/23/2023 0.70  0.61 - 1.24 mg/dL Final   Calcium 16/12/9602 8.5 (L)  8.9 - 10.3 mg/dL Final   GFR, Estimated 02/23/2023 >60  >60 mL/min Final   Comment: (NOTE) Calculated using the CKD-EPI Creatinine Equation (2021)    Anion gap 02/23/2023 9  5 - 15 Final   Performed at [facility], [address] [place], [place] [address]    ECG: Date: 02/23/2023 Time ECG obtained: 1432 PM Rate: 70 bpm Rhythm:  Sinus rhythm with marked sinus arrhythmia Axis (leads I and aVF): Left axis deviation Intervals: PR 176 ms. QRS 104 ms. QTc 432 ms. ST segment and T wave changes: No evidence of acute ST segment elevation or depression.  Evidence of a possible age undetermined anterior infarct present. Comparison: Similar to previous tracing obtained on 01/18/2023  IMAGING / PROCEDURES: NM PET  IMAGE RESTAG (PS) SKULL BASE TO THIGH performed on 02/01/2023 Markedly hypermetabolic left upper lobe pulmonary mass compatible with neoplasm, likely primary bronchogenic etiology. Given remote history of rectal cancer, metastatic disease considered less likely. No evidence for hypermetabolic metastatic disease in the neck, chest, abdomen, or pelvis. Mild asymmetric increased FDG uptake in the left adrenal gland without a discrete nodule or mass. This is favored to be physiologic. Attention to the left adrenal gland on follow-up imaging recommended. Asymmetric increased uptake in the left oropharynx. Potentially infectious/inflammatory. Direct visualization recommended to exclude underlying mucosal lesion Aortic atherosclerosis  CT CHEST W CONTRAST performed on 01/22/2023 Considerable enlargement of LEFT upper lobe nodule from comparison PET-CT. Nodule now mass size. Findings are concerning for primary bronchogenic carcinoma. No change in subcarinal node from comparison PET-CT. Peripheral nodularity in the upper lobes is favored benign scarring.  TRANSTHORACIC ECHOCARDIOGRAM performed on 11/18/2022 Left ventricular ejection fraction, by estimation, is 45 to 50%. Left ventricular ejection fraction by 3D volume is 47 %. The left ventricle has mildly decreased function. The left ventricle demonstrates global hypokinesis. Left ventricular diastolic  parameters are consistent with Grade I diastolic dysfunction (impaired relaxation). Elevated left ventricular end-diastolic pressure.  Right ventricular systolic function is normal. The right ventricular size is normal. There is normal pulmonary artery systolic pressure.  The mitral valve is normal in structure. Mild mitral valve regurgitation. No evidence of mitral stenosis.  The aortic valve has been repaired/replaced. Aortic valve regurgitation is not visualized. No aortic stenosis is present. Echo findings are consistent with normal structure and function  of the aortic valve prosthesis. Aortic valve area, by VTI measures 3.79 cm. Aortic valve mean gradient measures 6.0 mmHg. Aortic valve Vmax measures 1.73 m/s.  The inferior vena cava is normal in size with greater than 50% respiratory variability, suggesting right atrial pressure of 3 mmHg.   CT CORONARY MORPH W/CTA COR W/SCORE W/CA W/CM &/OR WO/CM performed on 10/15/2021 Calcified tri leaflet AV with score 2208 Optimum angiographic angle for deployment LAO 29 Caudal 9 degrees Annular area 555 mm2 Lower range for 29 mm Sapien 3 valve Sinus length fine some concern for somewhat small calcified STJ Coronary height sufficient for deployment Membranous septal length 9 mm  RIGHT/LEFT HEART CATHETERIZATION AND CORONARY ANGIOGRAPHY performed on 08/27/2020 Severe single-vessel coronary artery disease with severe stenosis of the mid circumflex unchanged from the previous cath study in 2015  Mild nonobstructive LAD plaquing and mild ostial diagonal stenoses Mild nonobstructive mid RCA stenosis and severe distal PDA stenosis unchanged from previous cath study Severe low-flow low gradient aortic stenosis with mean transvalvular gradient of 26 mmHg, aortic valve is calcified and restricted on plain fluoroscopy  Hemodynamics: Mean RA = 3 mmHg Mean PA = 22 mmHg Mean PCWP = 4 mmHg CO = 6.03 L/min CI = 3.2 L/min/m2 Mean transaortic valve gradient = 26.18 mmHg Aortic valve area (VTI) = 0.69 cm2  MYOCARDIAL PERFUSION IMAGING STUDY (LEXISCAN) performed on 08/31/2019 Nuclear stress EF: 37%. T wave inversion was noted during stress in the I, II, III, aVF and V6 leads. Downsloping ST segment depression ST segment depression was noted during stress in the V6 and II leads. Nondiagnostic EKG with Lexiscan due to resting ST abnormalities. There is a medium defect of moderate severity present in the basal inferolateral and mid inferolateral location. Findings consistent with ischemia in the left circumflex  distribution This is an intermediate risk study.  Impression and Plan:  Curtis Campos has  been referred for pre-anesthesia review and clearance prior to him undergoing the planned anesthetic and procedural courses. Available labs, pertinent testing, and imaging results were personally reviewed by me in preparation for upcoming operative/procedural course. [facility] medical record has been updated following extensive record review and patient interview with PAT staff.   This patient has been appropriately cleared by cardiology with an overall ACCEPTABLE risk of experiencing significant perioperative cardiovascular complications. Based on clinical review performed today (03/02/23), barring any significant acute changes in the patient's overall condition, it is anticipated that he will be able to proceed with the planned surgical intervention. Any acute changes in clinical condition may necessitate his procedure being postponed and/or cancelled. Patient will meet with anesthesia team (MD and/or CRNA) on the day of his procedure for preoperative evaluation/assessment. Questions regarding anesthetic course will be fielded at that time.   Pre-surgical instructions were reviewed with the patient during his PAT appointment, and questions were fielded to satisfaction by PAT clinical staff. He has been instructed on which medications that he will need to hold prior to surgery, as well as the ones that have been deemed safe/appropriate to take on the day of his procedure. As part of the general education provided by PAT, patient made aware both verbally and in writing, that he would need to abstain from the use of any illegal substances during his perioperative course.  He was advised that failure to follow the provided instructions could necessitate case cancellation or result in serious perioperative complications up to and including death. Patient encouraged to contact PAT and/or his surgeon's office to discuss  any questions or concerns that may arise prior to surgery; verbalized understanding.   Quentin Mulling, MSN, APRN, FNP-C, CEN [facility]  Perioperative Services Nurse Practitioner Phone: [phone] Fax: [phone] 03/02/23 8:59 AM  NOTE: This note has been prepared using Dragon dictation software. Despite my best ability to proofread, there is always the potential that unintentional transcriptional errors may still occur from this process.

## 2023-03-01 ENCOUNTER — Ambulatory Visit
Admission: RE | Admit: 2023-03-01 | Discharge: 2023-03-01 | Disposition: A | Discharge status: Home / Self Care | Payer: Medicare Other | Source: Ambulatory Visit | Attending: Pulmonary Disease | Admitting: Pulmonary Disease

## 2023-03-01 DIAGNOSIS — R918 Other nonspecific abnormal finding of lung field: Secondary | ICD-10-CM | POA: Diagnosis present

## 2023-03-02 ENCOUNTER — Encounter: Payer: Self-pay | Admitting: Urgent Care

## 2023-03-02 MED ORDER — ORAL CARE MOUTH RINSE: 15.0000 mL | Freq: Once | OROMUCOSAL | Status: AC

## 2023-03-02 MED ORDER — LACTATED RINGERS IV SOLN: INTRAVENOUS | Status: DC

## 2023-03-02 MED ORDER — CHLORHEXIDINE GLUCONATE 0.12 % MT SOLN
15.0000 mL | Freq: Once | OROMUCOSAL | Status: AC
  Administered 2023-03-03: 15 mL via OROMUCOSAL

## 2023-03-03 ENCOUNTER — Ambulatory Visit: Payer: Medicare Other

## 2023-03-03 ENCOUNTER — Encounter
Admission: RE | Disposition: A | Discharge status: Home / Self Care | Payer: Self-pay | Source: Home / Self Care | Attending: Pulmonary Disease

## 2023-03-03 ENCOUNTER — Other Ambulatory Visit: Payer: Self-pay

## 2023-03-03 ENCOUNTER — Ambulatory Visit
Admission: RE | Admit: 2023-03-03 | Discharge: 2023-03-03 | Disposition: A | Discharge status: Home / Self Care | Payer: Medicare Other | Attending: Pulmonary Disease | Admitting: Pulmonary Disease

## 2023-03-03 ENCOUNTER — Ambulatory Visit: Payer: Medicare Other | Admitting: Urgent Care

## 2023-03-03 DIAGNOSIS — Z87891 Personal history of nicotine dependence: Secondary | ICD-10-CM | POA: Diagnosis not present

## 2023-03-03 DIAGNOSIS — I2510 Atherosclerotic heart disease of native coronary artery without angina pectoris: Secondary | ICD-10-CM | POA: Diagnosis not present

## 2023-03-03 DIAGNOSIS — I5022 Chronic systolic (congestive) heart failure: Secondary | ICD-10-CM | POA: Diagnosis not present

## 2023-03-03 DIAGNOSIS — I110 Hypertensive heart disease with heart failure: Secondary | ICD-10-CM | POA: Insufficient documentation

## 2023-03-03 DIAGNOSIS — C3412 Malignant neoplasm of upper lobe, left bronchus or lung: Secondary | ICD-10-CM | POA: Diagnosis not present

## 2023-03-03 DIAGNOSIS — W44F9XA Other object of natural or organic material, entering into or through a natural orifice, initial encounter: Secondary | ICD-10-CM | POA: Diagnosis not present

## 2023-03-03 DIAGNOSIS — I252 Old myocardial infarction: Secondary | ICD-10-CM | POA: Diagnosis not present

## 2023-03-03 DIAGNOSIS — M1990 Unspecified osteoarthritis, unspecified site: Secondary | ICD-10-CM | POA: Insufficient documentation

## 2023-03-03 DIAGNOSIS — T17890A Other foreign object in other parts of respiratory tract causing asphyxiation, initial encounter: Secondary | ICD-10-CM | POA: Insufficient documentation

## 2023-03-03 DIAGNOSIS — Z952 Presence of prosthetic heart valve: Secondary | ICD-10-CM | POA: Insufficient documentation

## 2023-03-03 DIAGNOSIS — Z85048 Personal history of other malignant neoplasm of rectum, rectosigmoid junction, and anus: Secondary | ICD-10-CM | POA: Diagnosis not present

## 2023-03-03 DIAGNOSIS — J449 Chronic obstructive pulmonary disease, unspecified: Secondary | ICD-10-CM | POA: Insufficient documentation

## 2023-03-03 DIAGNOSIS — R911 Solitary pulmonary nodule: Secondary | ICD-10-CM | POA: Diagnosis present

## 2023-03-03 HISTORY — DX: Cataract extraction status, left eye: Z98.42

## 2023-03-03 HISTORY — DX: Cataract extraction status, left eye: Z98.41

## 2023-03-03 HISTORY — DX: Long term (current) use of aspirin: Z79.82

## 2023-03-03 HISTORY — DX: Calculus of gallbladder without cholecystitis without obstruction: K80.20

## 2023-03-03 HISTORY — DX: Other specified health status: Z78.9

## 2023-03-03 HISTORY — DX: Umbilical hernia without obstruction or gangrene: K42.9

## 2023-03-03 HISTORY — DX: Other cerebrovascular disease: I67.89

## 2023-03-03 HISTORY — DX: Pulmonary hypertension, unspecified: I27.20

## 2023-03-03 HISTORY — DX: Cardiomegaly: I51.7

## 2023-03-03 HISTORY — DX: Anxiety disorder, unspecified: F41.9

## 2023-03-03 HISTORY — PX: VIDEO BRONCHOSCOPY WITH ENDOBRONCHIAL ULTRASOUND: SHX6177

## 2023-03-03 SURGERY — BRONCHOSCOPY, WITH EBUS
Anesthesia: General

## 2023-03-03 MED ORDER — IPRATROPIUM-ALBUTEROL 0.5-2.5 (3) MG/3ML IN SOLN
RESPIRATORY_TRACT | Status: AC
Start: 2023-03-03 — End: ?
  Filled 2023-03-03: qty 3

## 2023-03-03 MED ORDER — OXYCODONE HCL 5 MG PO TABS: 5.0000 mg | ORAL_TABLET | Freq: Once | ORAL | Status: DC | PRN

## 2023-03-03 MED ORDER — PHENYLEPHRINE HCL-NACL 20-0.9 MG/250ML-% IV SOLN
INTRAVENOUS | Status: AC
Start: 2023-03-03 — End: ?
  Filled 2023-03-03: qty 250

## 2023-03-03 MED ORDER — PROPOFOL 10 MG/ML IV BOLUS
INTRAVENOUS | Status: DC | PRN
  Administered 2023-03-03: 100 mg via INTRAVENOUS

## 2023-03-03 MED ORDER — LIDOCAINE HCL (CARDIAC) PF 100 MG/5ML IV SOSY
PREFILLED_SYRINGE | INTRAVENOUS | Status: DC | PRN
  Administered 2023-03-03: 80 mg via INTRAVENOUS

## 2023-03-03 MED ORDER — ONDANSETRON HCL 4 MG/2ML IJ SOLN: 4.0000 mg | Freq: Once | INTRAMUSCULAR | Status: DC | PRN

## 2023-03-03 MED ORDER — ONDANSETRON HCL 4 MG/2ML IJ SOLN
INTRAMUSCULAR | Status: DC | PRN
  Administered 2023-03-03: 4 mg via INTRAVENOUS

## 2023-03-03 MED ORDER — FENTANYL CITRATE (PF) 100 MCG/2ML IJ SOLN: 25.0000 ug | INTRAMUSCULAR | Status: DC | PRN

## 2023-03-03 MED ORDER — ACETAMINOPHEN 10 MG/ML IV SOLN: 1000.0000 mg | Freq: Once | INTRAVENOUS | Status: DC | PRN

## 2023-03-03 MED ORDER — IPRATROPIUM-ALBUTEROL 0.5-2.5 (3) MG/3ML IN SOLN
3.0000 mL | Freq: Once | RESPIRATORY_TRACT | Status: AC
  Administered 2023-03-03: 3 mL via RESPIRATORY_TRACT

## 2023-03-03 MED ORDER — SUGAMMADEX SODIUM 200 MG/2ML IV SOLN
INTRAVENOUS | Status: DC | PRN
  Administered 2023-03-03: 200 mg via INTRAVENOUS

## 2023-03-03 MED ORDER — PHENYLEPHRINE HCL-NACL 20-0.9 MG/250ML-% IV SOLN
INTRAVENOUS | Status: DC | PRN
Start: 2023-03-03 — End: 2023-03-03
  Administered 2023-03-03: 40 ug/min via INTRAVENOUS

## 2023-03-03 MED ORDER — GLYCOPYRROLATE 0.2 MG/ML IJ SOLN
INTRAMUSCULAR | Status: DC | PRN
  Administered 2023-03-03: .2 mg via INTRAVENOUS

## 2023-03-03 MED ORDER — LACTATED RINGERS IV SOLN: INTRAVENOUS | Status: AC

## 2023-03-03 MED ORDER — OXYCODONE HCL 5 MG/5ML PO SOLN: 5.0000 mg | Freq: Once | ORAL | Status: DC | PRN

## 2023-03-03 MED ORDER — ROCURONIUM BROMIDE 100 MG/10ML IV SOLN
INTRAVENOUS | Status: DC | PRN
  Administered 2023-03-03: 40 mg via INTRAVENOUS

## 2023-03-03 MED ORDER — DEXAMETHASONE SODIUM PHOSPHATE 10 MG/ML IJ SOLN
INTRAMUSCULAR | Status: DC | PRN
  Administered 2023-03-03: 10 mg via INTRAVENOUS

## 2023-03-03 MED ORDER — PHENYLEPHRINE 80 MCG/ML (10ML) SYRINGE FOR IV PUSH (FOR BLOOD PRESSURE SUPPORT)
PREFILLED_SYRINGE | INTRAVENOUS | Status: DC | PRN
  Administered 2023-03-03 (×2): 80 ug via INTRAVENOUS
  Administered 2023-03-03 (×2): 160 ug via INTRAVENOUS
  Administered 2023-03-03 (×5): 80 ug via INTRAVENOUS
  Administered 2023-03-03: 160 ug via INTRAVENOUS
  Administered 2023-03-03: 80 ug via INTRAVENOUS

## 2023-03-03 MED ORDER — CHLORHEXIDINE GLUCONATE 0.12 % MT SOLN
OROMUCOSAL | Status: AC
  Filled 2023-03-03: qty 15

## 2023-03-03 MED ORDER — FENTANYL CITRATE (PF) 100 MCG/2ML IJ SOLN
INTRAMUSCULAR | Status: DC | PRN
  Administered 2023-03-03: 25 ug via INTRAVENOUS

## 2023-03-03 MED ORDER — FENTANYL CITRATE (PF) 100 MCG/2ML IJ SOLN
INTRAMUSCULAR | Status: AC
  Filled 2023-03-03: qty 2

## 2023-03-03 NOTE — Transfer of Care (Signed)
Immediate Anesthesia Transfer of Care Note  Patient: Curtis Campos  Procedure(s) Performed: VIDEO BRONCHOSCOPY WITH ENDOBRONCHIAL ULTRASOUND ROBOTIC ASSISTED NAVIGATIONAL BRONCHOSCOPY  Patient Location: PACU  Anesthesia Type:General   Level of Consciousness: awake and drowsy  Airway & Oxygen Therapy: Patient Spontanous Breathing and Patient connected to nasal cannula oxygen  Post-op Assessment: Report given to RN and Post -op Vital signs reviewed and stable  Post vital signs: Reviewed and stable  Last Vitals:  Vitals Value Taken Time  BP 125/50 03/03/23 1421  Temp    Pulse 64 03/03/23 1423  Resp 11 03/03/23 1423  SpO2 100 % 03/03/23 1423  Vitals shown include unfiled device data.  Last Pain:  Vitals:   03/03/23 1118  TempSrc: Temporal  PainSc: 0-No pain         Complications: No notable events documented.

## 2023-03-03 NOTE — Anesthesia Postprocedure Evaluation (Signed)
Anesthesia Post Note  Patient: Curtis Campos  Procedure(s) Performed: VIDEO BRONCHOSCOPY WITH ENDOBRONCHIAL ULTRASOUND ROBOTIC ASSISTED NAVIGATIONAL BRONCHOSCOPY  Patient location during evaluation: PACU Anesthesia Type: General Level of consciousness: awake and alert, oriented and patient cooperative Pain management: pain level controlled Vital Signs Assessment: post-procedure vital signs reviewed and stable Respiratory status: spontaneous breathing, nonlabored ventilation and respiratory function stable Cardiovascular status: blood pressure returned to baseline and stable Postop Assessment: adequate PO intake Anesthetic complications: no   No notable events documented.   Last Vitals:  Vitals:   03/03/23 1459 03/03/23 1500  BP: (!) 118/54 113/74  Pulse: 65 (!) 55  Resp: 15 12  Temp:    SpO2: 94% 94%    Last Pain:  Vitals:   03/03/23 1459  TempSrc:   PainSc: 0-No pain                 Reed Breech

## 2023-03-03 NOTE — Progress Notes (Signed)
Patient on baseline o2, at home utilizes 4 L/min. Per Dr. Karna Christmas patient can be weaned down to 2 liters or room air. Attempted room air and patient dropped to 87%. Reapplied o2 via [place] @ 2l/min and patient tolerated well, at 92%. Dr. Karna Christmas is okay with patient being 90% or above.

## 2023-03-03 NOTE — Progress Notes (Signed)
Per Dr. Karna Christmas, patient is able to be discharged home, xray shows no abnormalities per MD.

## 2023-03-03 NOTE — Anesthesia Procedure Notes (Signed)
Procedure Name: Intubation Date/Time: 03/03/2023 12:08 PM  Performed by: Ginger Carne, CRNAPre-anesthesia Checklist: Patient identified, Emergency Drugs available, Suction available, Patient being monitored and Timeout performed Patient Re-evaluated:Patient Re-evaluated prior to induction Oxygen Delivery Method: Circle system utilized Preoxygenation: Pre-oxygenation with 100% oxygen Induction Type: IV induction Ventilation: Mask ventilation without difficulty and Oral airway inserted - appropriate to patient size Laryngoscope Size: McGrath and 3 Grade View: Grade I Tube type: Oral Tube size: 8.5 mm Number of attempts: 1 Airway Equipment and Method: Stylet and Video-laryngoscopy Placement Confirmation: ETT inserted through vocal cords under direct vision, positive ETCO2 and breath sounds checked- equal and bilateral Secured at: 22 cm Tube secured with: Tape Dental Injury: Teeth and Oropharynx as per pre-operative assessment

## 2023-03-03 NOTE — Procedures (Signed)
ROBOTIC NAVIGATIONAL BRONCHOSCOPY PROCEDURE NOTE  FIBEROPTIC BRONCHOSCOPY WITH THERAPEUTIC ASPIRATION OF TRACHEOBRONCHIAL TREE AND  BRONCHOALVEOLAR LAVAGE PROCEDURE NOTE  ENDOBRONCHIAL ULTRASOUND  >3LYMPH NODES PROCEDURE NOTE    Flexible bronchoscopy was performed  by : Curtis Christmas MD  assistance by : 1)Repiratory therapist  and 2)[employer] staff and 3) Anesthesia team and 4) Flouroscopy team and 5) [facility] supporting staff   Indication for the procedure was :  Pre-procedural H&P. The following assessment was performed on the day of the procedure prior to initiating sedation History:  Chest pain n Dyspnea y Hemoptysis n Cough y Fever n Other pertinent items n  Examination Vital signs -reviewed as per nursing documentation today Cardiac    Murmurs: n  Rubs : n  Gallop: n Lungs Wheezing: n Rales : n Rhonchi :y  Other pertinent findings: SOB/hypoxemia due to chronic lung disease   Pre-procedural assessment for Procedural Sedation included: Depth of sedation: As per anesthesia team  ASA Classification:  2 Mallampati airway assessment: 3    Medication list reviewed: y  The patient's interval history was taken and revealed: no new complaints The pre- procedure physical examination revealed: No new findings Refer to prior clinic note for details.  Informed Consent: Informed consent was obtained from:  patient after explanation of procedure and risks, benefits, as well as alternative procedures available.  Explanation of level of sedation and possible transfusion was also provided.    Procedural Preparation: Time out was performed and patient was identified by name and birthdate and procedure to be performed and side for sampling, if any, was specified. Pt was intubated by anesthesia.  The patient was appropriately draped.   Fiberoptic bronchoscopy with airway inspection and BAL Procedure findings:  Bronchoscope was inserted via ETT  without difficulty.  Posterior  oropharynx, epiglottis, arytenoids, false cords and vocal cords were not visualized as these were bypassed by endotracheal tube. The distal trachea was normal in circumference and appearance without mucosal, cartilaginous or branching abnormalities.  The main carina was mildly splayed . All right and left lobar airways were visualized to the Subsegmental level.  Sub- sub segmental carinae were identified in all the distal airways.   Secretions were visible in the following airways and appeared to be clear.  The mucosa was : PATIENT HAD SEVERE MUCUS PLUGGING.  AIRWAYS OF LEFT LUNG WERE OCCLUDED WITH MUCUS PLUGGING. LEFT UPPER LOBE WAS 100% OBSTRUCTED WITH MUCUS PLUGGING, THIS TOOK MULTIPLE ASPIRATIONS TO CLEAR.  THE LEFT LOWER LOBE WAS PARTIALLY OCCLUDED WITH MUCUS PLUGGING, THIS ALSO TOOK MULTIPLE ATTEMPTS AT CLEARING.  RIGHT LUNG WAS ALSO SEVERELY MUCUS PLUGGED. MULTIPLE LOBES INCLUDING RML, RLL BASAL SEGMENTS WERE TREATED WITH THERPEUTIC ASPIRATION    Airways were notable for:        exophytic lesions :n       extrinsic compression in the following distributions: n.       Friable mucosa: YES AT LEFT UPPER LOBE       Anthrocotic material /pigmentation: n     Media Information  Document Information  Photos    03/03/2023 12:26  Attached To:  Hospital Encounter on 03/03/23  Source Information  [name], MD  [facility]  Document History     Post procedure Diagnosis:   SEVERE MUCUS PLUGGIN     Electromagnetic Navigational Bronchoscopy Procedure Findings:    Post appropriate planning and registration peripheral navigation was used to visualize target lesion.     LEFT UPPER LOBE - ENDOBRONCHIAL CYTOBRUSH - X3 FOR CYTOLOGY   LEFT  UPPER LOBE - ENDOBRONCHIAL BIOPSY X 9 - LESIONAL CARCINOMA  BRONCHOALVEOLAR LAVAGE WAS PERFORMED AFTER NAVIGATIONAL BRONCHOSCOPY - LEFT UPPER LOBE   Post procedure diagnosis:  LUNG CANCER       Endobronchial ultrasound assisted hilar and  mediastinal lymph node biopsies procedure findings: The fiberoptic bronchoscope was removed and the EBUS scope was introduced. Examination began to evaluate for pathologically enlarged lymph nodes starting on the RIGHT  side progressing to the LEFT side.  All lymph node biopsies performed with 21G needle. Lymph node biopsies were sent in cytolite for all stations.  STATION 7 - 1.8CM - 4 BIOPSIES STATION 10L - 1.3CM 3 BIOPSIES  STAION 11L 1.1CM - 3 BIOPSIES   Post procedure diagnosis:  LYMPHADENOPATHY CONSISTENT WITH METASTASIS   Specimens obtained included:  Bronchial washings site: LEFT UPPER LOBE sent for: GRAM STAIN AND CULTURE                                               Cytology brushes : LEFT UPPER LOBE   Broncho-alveolar lavage site:LEFT UPPER LOBE   sent for CYTOLOGY                             40ml volume infused 20ml volume returned with CELLULAR  appearance  Endobronchial biopsy site:  LEFT UPPER LOBE; sent for CYTOLOGY - LESIONAL CARCINOMA                                   Fluoroscopy Used: YES ;        Pictorial documentation attached: YES            Immediate sampling complications included:NONE IMMEDIATE Epinephrine NONE ml was used topically  The bronchoscopy was terminated due to completion of the planned procedure and the bronchoscope was removed.   Total dosage of Lidocaine was ZERO mg Total fluoroscopy time was AS PER RADIOLOGY  minutes  Supplemental oxygen was provided at AS PER ANESTHESIA lpm by nasal canula post operatively  Estimated Blood loss: EXPECTED <5cc.  Complications included:  NONE IMMEDIATE   Preliminary CXR findings :  IN PROCESS  Disposition: HOME WITH FAMILY   Follow up with Dr. Karna Campos in 5 days for result discussion.     [name] MD  [facility] Division of Pulmonary & Critical Care Medicine

## 2023-03-03 NOTE — Anesthesia Preprocedure Evaluation (Addendum)
Anesthesia Evaluation  Patient identified by MRN, date of birth, ID band Patient awake    Reviewed: Allergy & Precautions, NPO status , Patient's Chart, lab work & pertinent test results  History of Anesthesia Complications Negative for: history of anesthetic complications  Airway Mallampati: III   Neck ROM: Full    Dental  (+) Edentulous Upper, Missing   Pulmonary COPD (on 4L home O2), former smoker (quit 2014)    + wheezing (expiratory, diffuse)      Cardiovascular hypertension, + CAD (s/p MI) and +CHF (ICM; EF 45-50%)  + Valvular Problems/Murmurs (AS s/p TAVR 2023)  Rhythm:Regular Rate:Normal  ECG 02/23/23: Sinus rhythm with marked sinus arrhythmia Left axis deviation   Echo 11/18/22:  1. Left ventricular ejection fraction, by estimation, is 45 to 50%. Left ventricular ejection fraction by 3D volume is 47 %. The left ventricle has mildly decreased function. The left ventricle demonstrates global hypokinesis. Left ventricular diastolic  parameters are consistent with Grade I diastolic dysfunction (impaired relaxation). Elevated left ventricular end-diastolic pressure.  2. Right ventricular systolic function is normal. The right ventricular size is normal. There is normal pulmonary artery systolic pressure.  3. The mitral valve is normal in structure. Mild mitral valve regurgitation. No evidence of mitral stenosis.  4. The aortic valve has been repaired/replaced. Aortic valve regurgitation is not visualized. No aortic stenosis is present. Echo findings are consistent with normal structure and function of the aortic valve prosthesis. Aortic valve area, by VTI measures 3.79 cm. Aortic valve mean gradient measures 6.0 mmHg. Aortic valve Vmax measures 1.73 m/s.  5. The inferior vena cava is normal in size with greater than 50% respiratory variability, suggesting right atrial pressure of 3 mmHg.   Myocardial perfusion 08/31/19:  1. Nuclear  stress EF: 37%. 2. T wave inversion was noted during stress in the I, II, III, aVF and V6 leads. 3. Downsloping ST segment depression ST segment depression was noted during stress in the V6 and II leads. Nondiagnostic EKG with Lexiscan due to resting ST abnormalities. 4. There is a medium defect of moderate severity present in the basal inferolateral and mid inferolateral location. 5. Findings consistent with ischemia in the left circumflex distribution 6. This is an intermediate risk study.   Neuro/Psych negative neurological ROS     GI/Hepatic Rectal CA   Endo/Other  negative endocrine ROS    Renal/GU negative Renal ROS     Musculoskeletal  (+) Arthritis ,    Abdominal   Peds  Hematology negative hematology ROS (+)   Anesthesia Other Findings Cardiology note 02/24/23:  1. Chronic systolic heart failure: Most recent echocardiogram  showed an EF of 45 to 50% He appears to be euvolemic on torsemide 20 mg once daily.  Continue Entresto, carvedilol , Farxiga and spironolactone. Given that his blood pressure is low, I elected to decrease Entresto by half.   2. Coronary artery disease involving native coronary arteries without angina: He has known chronic subtotal occlusion of the mid left circumflex.  Currently with no anginal symptoms.  Continue medical therapy.   3. Essential hypertension: Blood pressure is well controlled.   4. Hyperlipidemia: He has history of intolerance to statins and currently is on Praluent and Zetia.     5.  Status post TAVR for severe aortic stenosis.  Most recent echocardiogram in August showed normal functioning TAVR prosthesis.   6.  Preoperative cardiovascular evaluation for bronchoscopy and possible biopsy: The patient is stable from a cardiac standpoint and does not require  further cardiac workup.  Aspirin can be held 7 days before the procedure.   Disposition: Follow-up in 6 months.  Reproductive/Obstetrics                              Anesthesia Physical Anesthesia Plan  ASA: 3  Anesthesia Plan: General   Post-op Pain Management:    Induction: Intravenous  PONV Risk Score and Plan: 2 and Ondansetron, Dexamethasone and Treatment may vary due to age or medical condition  Airway Management Planned: Oral ETT  Additional Equipment:   Intra-op Plan:   Post-operative Plan: Extubation in OR  Informed Consent: I have reviewed the patients History and Physical, chart, labs and discussed the procedure including the risks, benefits and alternatives for the proposed anesthesia with the patient or authorized representative who has indicated his/her understanding and acceptance.     Dental advisory given  Plan Discussed with: CRNA  Anesthesia Plan Comments: (Patient consented for risks of anesthesia including but not limited to:  - adverse reactions to medications - damage to eyes, teeth, lips or other oral mucosa - nerve damage due to positioning  - sore throat or hoarseness - damage to heart, brain, nerves, lungs, other parts of body or loss of life  Informed patient about role of CRNA in peri- and intra-operative care.  Patient voiced understanding.)        Anesthesia Quick Evaluation

## 2023-03-03 NOTE — H&P (Addendum)
PULMONOLOGY         Date: 03/03/2023,   MRN# [id number] Curtis Campos 07/19/1940     Admission                  Current      CHIEF COMPLAINT:   Left upper lobe lung nodule with hilar adenopathy   HISTORY OF PRESENT ILLNESS   This is a pleasant 82 yo male with COPD and chronic hypoxemia as well as extensive comorbid history as noted below. He was found to have nodule on lung of left upper lobe concerning for primary bronchogenic carcinoma.   Today he is here for bronchoscopy to diagnose lung cancer and expedite therapy.    He had preoperative cardiac evaluation and clearance for procedure , he had pre procedural anesthesia evaluation and clearance.  His nodule is located at fissure of left lung which is carries a higher risk of pneumothorax.  He is aware of this and wishes to proceed with planned bronchoscopy.  He is ok with hospitalization if necessary.    Reviewed risks/complications and benefits with patient, risks include infection, pneumothorax/pneumomediastinum which may require chest tube placement as well as overnight/prolonged hospitalization and possible mechanical ventilation. Other risks include bleeding and very rarely death.  Patient understands risks and wishes to proceed.  Additional questions were answered, and patient is aware that post procedure patient will be going home with family and may experience cough with possible clots on expectoration as well as phlegm which may last few days as well as hoarseness of voice post intubation and mechanical ventilation.     PAST MEDICAL HISTORY   Past Medical History:  Diagnosis Date   Agent orange exposure    Anemia    Anxiety    Aortic atherosclerosis (HCC)    Aortic stenosis 07/04/2013   a.)TTE 07/04/13: mild (MPG 12; AVA 1.6); b.) TTE 01/01/16: mod (MPG 14; AVA 1.17); c.) TTE 08/30/17: mod (MPG 15; AVA 1.25); d.) TTE 07/13/19: mod (MPG 19; AVA 1.16); e.) TTE 07/23/20: mod (MPG 22; AVA 1.25); f.) TTE  03/12/2021: mod-sev (MPH 34; AVA 0.75); g.) TTE 05/21/2021: mod (MPG 22; AVA 0.8); h.) RHC 08/27/2020: sev (MPG 26.18; AVA 0.69); i.) s/p TAVR 11/25/21: 26 mm Edwards S3 Ultra Resilia   Arthritis 03/12/2011   Cardiomegaly    Cerebral microvascular disease    Cholelithiasis    Chronic hyponatremia    Chronic obstructive lung disease (HCC)    Colostomy in place [facility])    a.) s/p resection secondary to rectal cancer   Coronary artery disease 03/2013   a.) LHC 04/10/2013: 10% mLAD, 50% D1, 30% D2, 99% mLCx, 50% OM1, 40% pRCA, 30% mRCA, 60% RPDA, 40% RPLS - med mgmt; b.) MV 08/31/2019: basal inflat/mid inflat defect c/w ischemia in LCx distribution. EF 37%; c.) R/LHC 08/27/2020: 50% D1, 50% D2, 90% mLCx, 40% p-mRCA, 80% RPDA - med mgmt   Former smoker    HFrEF (heart failure with reduced ejection fraction) (HCC)    a.) TEE 04/13/13: EF 20-25, glob HK, G1DD, mild LAE, mod-sev MR; b.) TTE 08/30/17: EF 45-50, LVH, mild LAE, G1DD, mod AS, mod MR; c.) TTE 07/13/19: EF 30-35, glob HK, G1DD, RVSP 49.4, mild LAE, mild-mod MR, mod AS; d.) TTE 03/12/21: EF 55, G2DD, mild MR, mod-sev AS; e.) TTE 12/24/21: EF 50, mild LVH, G1DD, RVSP 40.5, mild-mod MR, triv AR; f.) TTE 11/18/22: EF 45-50, glob HK, G1DD, mild MR   History of bilateral cataract extraction  Hyperlipidemia    Hypertension    Hypokalemia    Ischemic cardiomyopathy    a.) TTE 04/08/2014: EF 25-30%; b.) TEE 04/13/2013: EF 20-25%; c.) TTE 07/04/2013: EF 30-35%; d.) TTE 01/01/2016: EF 40-45%; e.) TTE 08/30/2017: EF 45-50%; f.) TTE 07/03/2019: EF 30-35%; g.) MV 08/31/2019: EF 37%; h.) TTE 07/23/20: EF 55-60%; i.) TTE 03/12/2021: EF 55%; j.) TTE 05/21/2021: EF 40-45%; k.) TTE 11/26/2021: EF 45-50%; l.) TTE 12/24/2021: EF 50%; m.) TTE 11/18/2022: EF 45-50%   Long term (current) use of aspirin    Mass of upper lobe of left lung 03/10/2021   a.) CTA chest 03/10/2021: 6.1 mm; b.) CTA chest 10/15/2021: 8 mm; c.) PET CT 11/14/2021: 8 mm (SUV max 2.8); d.) CXR  10/05/2022: interval increase to 2.7 cm; e.) CXR 01/18/2023: interval increase to 3.7 cm; f.) CT chest 01/22/2023: 3.8 x 3.2 cm; g.) PET CT 02/01/2023: 3.8 cm and markedly hypermetabolic with SUV max of 43   Multifocal atrial tachycardia (HCC)    Myocardial infarction (HCC)    a.) suspected by cardiology during 03/2013 admission for CP/CAP/new onset CHF --> TTE  showed severe LV dysfunction with segmental wall motion abnormalities and severe MR (posteriorly-directed  jet)   Pneumonia    Pre-diabetes    Pulmonary hypertension (HCC)    a.) TTE 04/08/2013: RVSP 62.2; b.) TTE 07/13/2019: RVSP 49.4; c.) TTE 07/23/2020: RVSP 33.2; d.) TTE 11/26/2021: RVSP 41.2; e.) TTE 12/24/2021: RVSP 40.5; f.) TTE 11/18/2019: RVSP 29.8   Rectal cancer (HCC)    Requires continuous at home supplemental oxygen    a.) 4L/[place]   S/P TAVR (transcatheter aortic valve replacement) 11/25/2021   a.) 26mm Edwards Sapien 3 Ultra Resilia via the TF approach   Statin intolerance    Umbilical hernia (s/p repair)      SURGICAL HISTORY   Past Surgical History:  Procedure Laterality Date   APPENDECTOMY  06/29/2003   CATARACT EXTRACTION, BILATERAL Bilateral    COLOSTOMY     HERNIA REPAIR     INTRAOPERATIVE TRANSTHORACIC ECHOCARDIOGRAM N/A 11/25/2021   Procedure: INTRAOPERATIVE TRANSTHORACIC ECHOCARDIOGRAM;  Surgeon: Kathleene Hazel, MD;  Location: [facility] OR;  Service: Open Heart Surgery;  Laterality: N/A;   resection of rectum     RIGHT/LEFT HEART CATH AND CORONARY ANGIOGRAPHY Bilateral 04/10/2013   Procedure: RIGHT/LEFT HEART CATH AND CORONARY ANGIOGRAPHY; Location: [facility]; Surgeon: Lorine Bears, MD   RIGHT/LEFT HEART CATH AND CORONARY ANGIOGRAPHY N/A 08/27/2021   Procedure: RIGHT/LEFT HEART CATH AND CORONARY ANGIOGRAPHY;  Surgeon: Tonny Bollman, MD;  Location: [facility] INVASIVE CV LAB;  Service: Cardiovascular;  Laterality: N/A;   TRANSCATHETER AORTIC VALVE REPLACEMENT, TRANSFEMORAL Left 11/25/2021   Procedure:  Transcatheter Aortic Valve Replacement, Transfemoral using a 26 MM Edwards SAPIEN 3 Ultra.;  Surgeon: Kathleene Hazel, MD;  Location: [facility] OR;  Service: Open Heart Surgery;  Laterality: Left;     FAMILY HISTORY   Family History  Problem Relation Age of Onset   Heart disease Mother    Heart disease Father    Hyperlipidemia Father    Hypertension Father    Heart disease Sister        stents placed    Cancer - Cervical Sister    Heart disease Sister        CABG   Heart attack Brother 62       MI     SOCIAL HISTORY   Social History   Tobacco Use   Smoking status: Former    Current packs/day: 0.00  Average packs/day: 1 pack/day for 50.0 years (50.0 ttl pk-yrs)    Types: Cigarettes    Start date: 03/26/1963    Quit date: 03/25/2013    Years since quitting: 9.9   Smokeless tobacco: Never  Vaping Use   Vaping status: Never Used  Substance Use Topics   Alcohol use: No    Alcohol/week: 0.0 standard drinks of alcohol   Drug use: No     MEDICATIONS    Home Medication:    Current Medication:  Current Facility-Administered Medications:    chlorhexidine (PERIDEX) 0.12 % solution 15 mL, 15 mL, Mouth/Throat, Once **OR** Oral care mouth rinse, 15 mL, Mouth Rinse, Once, Reed Breech, MD   lactated ringers infusion, , Intravenous, Continuous, Reed Breech, MD    ALLERGIES   Codeine and Statins     REVIEW OF SYSTEMS    Review of Systems:  Gen:  Denies  fever, sweats, chills weigh loss  HEENT: Denies blurred vision, double vision, ear pain, eye pain, hearing loss, nose bleeds, sore throat Cardiac:  No dizziness, chest pain or heaviness, chest tightness,edema Resp:   reports dyspnea chronically  Gi: Denies swallowing difficulty, stomach pain, nausea or vomiting, diarrhea, constipation, bowel incontinence Gu:  Denies bladder incontinence, burning urine Ext:   Denies Joint pain, stiffness or swelling Skin: Denies  skin rash, easy bruising or bleeding  or hives Endoc:  Denies polyuria, polydipsia , polyphagia or weight change Psych:   Denies depression, insomnia or hallucinations   Other:  All other systems negative   VS: BP 131/61   Pulse 66   Temp 97.6 F (36.4 C) (Temporal)   Resp 18   SpO2 94%      PHYSICAL EXAM    GENERAL:NAD, no fevers, chills, no weakness no fatigue HEAD: Normocephalic, atraumatic.  EYES: Pupils equal, round, reactive to light. Extraocular muscles intact. No scleral icterus.  MOUTH: Moist mucosal membrane. Dentition intact. No abscess noted.  EAR, NOSE, THROAT: Clear without exudates. No external lesions.  NECK: Supple. No thyromegaly. No nodules. No JVD.  PULMONARY: decreased breath sounds with mild rhonchi worse at bases bilaterally. + wheezing CARDIOVASCULAR: S1 and S2. Regular rate and rhythm. No murmurs, rubs, or gallops. No edema. Pedal pulses 2+ bilaterally.  GASTROINTESTINAL: Soft, nontender, nondistended. No masses. Positive bowel sounds. No hepatosplenomegaly.  MUSCULOSKELETAL: No swelling, clubbing, or edema. Range of motion full in all extremities.  NEUROLOGIC: Cranial nerves II through XII are intact. No gross focal neurological deficits. Sensation intact. Reflexes intact.  SKIN: No ulceration, lesions, rashes, or cyanosis. Skin warm and dry. Turgor intact.  PSYCHIATRIC: Mood, affect within normal limits. The patient is awake, alert and oriented x 3. Insight, judgment intact.       IMAGING   Reviewed CT chest from 03/03/23  ASSESSMENT/PLAN   Left upper lobe lung nodule with hilar adenopathy   Patient here today for bronchoscopy -plan for flexible bronchoscopy and airway inspection followed by fiberoptic bronchoscopy with therapeutic aspiration of trachebronchial tree and bronchoalveolar lavage , followed by robotic bronchoscopy with transbronchial biopsy and endobronchial biopsy, followed by EBUS for lymph node staging.    -Reviewed risks/complications and benefits with patient, risks  include infection, pneumothorax/pneumomediastinum which may require chest tube placement as well as overnight/prolonged hospitalization and possible mechanical ventilation. Other risks include bleeding and very rarely death.  Patient understands risks and wishes to proceed.  Additional questions were answered, and patient is aware that post procedure patient will be going home with family and may experience cough with  possible clots on expectoration as well as phlegm which may last few days as well as hoarseness of voice post intubation and mechanical ventilation.       Thank you for allowing me to participate in the care of this patient.   Patient/Family are satisfied with care plan and all questions have been answered.    Provider disclosure: Patient with at least one acute or chronic illness or injury that poses a threat to life or bodily function and is being managed actively during this encounter.  All of the below services have been performed independently by signing provider:  review of prior documentation from internal and or external health records.  Review of previous and current lab results.  Interview and comprehensive assessment during patient visit today. Review of current and previous chest radiographs/CT scans. Discussion of management and test interpretation with health care team and patient/family.   This document was prepared using Dragon voice recognition software and may include unintentional dictation errors.     [name], M.D.  Division of Pulmonary & Critical Care Medicine

## 2023-03-04 ENCOUNTER — Encounter: Payer: Self-pay | Admitting: Pulmonary Disease

## 2023-03-05 LAB — CYTOLOGY - NON PAP

## 2023-03-05 LAB — SURGICAL PATHOLOGY

## 2023-03-06 ENCOUNTER — Encounter: Payer: Self-pay | Admitting: Pharmacy Technician

## 2023-03-06 ENCOUNTER — Other Ambulatory Visit: Payer: Medicare Other

## 2023-03-06 ENCOUNTER — Emergency Department
Admission: EM | Admit: 2023-03-06 | Discharge: 2023-03-06 | Disposition: A | Discharge status: Home / Self Care | Payer: No Typology Code available for payment source | Attending: Emergency Medicine | Admitting: Emergency Medicine

## 2023-03-06 ENCOUNTER — Emergency Department: Payer: No Typology Code available for payment source

## 2023-03-06 ENCOUNTER — Other Ambulatory Visit: Payer: Self-pay

## 2023-03-06 DIAGNOSIS — R0602 Shortness of breath: Secondary | ICD-10-CM | POA: Diagnosis present

## 2023-03-06 DIAGNOSIS — Z85048 Personal history of other malignant neoplasm of rectum, rectosigmoid junction, and anus: Secondary | ICD-10-CM | POA: Diagnosis not present

## 2023-03-06 DIAGNOSIS — J9611 Chronic respiratory failure with hypoxia: Secondary | ICD-10-CM | POA: Diagnosis not present

## 2023-03-06 DIAGNOSIS — I509 Heart failure, unspecified: Secondary | ICD-10-CM | POA: Diagnosis not present

## 2023-03-06 LAB — COMPREHENSIVE METABOLIC PANEL
ALT: 16 U/L (ref 0–44)
AST: 14 U/L — ABNORMAL LOW (ref 15–41)
Albumin: 2.8 g/dL — ABNORMAL LOW (ref 3.5–5.0)
Alkaline Phosphatase: 107 U/L (ref 38–126)
Anion gap: 11 (ref 5–15)
BUN: 26 mg/dL — ABNORMAL HIGH (ref 8–23)
CO2: 26 mmol/L (ref 22–32)
Calcium: 8.4 mg/dL — ABNORMAL LOW (ref 8.9–10.3)
Chloride: 94 mmol/L — ABNORMAL LOW (ref 98–111)
Creatinine, Ser: 1.03 mg/dL (ref 0.61–1.24)
GFR, Estimated: >60 mL/min (ref >60)
Glucose, Bld: 113 mg/dL — ABNORMAL HIGH (ref 70–99)
Potassium: 3.9 mmol/L (ref 3.5–5.1)
Sodium: 131 mmol/L — ABNORMAL LOW (ref 135–145)
Total Bilirubin: 0.6 mg/dL (ref <1.2)
Total Protein: 6.3 g/dL — ABNORMAL LOW (ref 6.5–8.1)

## 2023-03-06 LAB — TROPONIN I (HIGH SENSITIVITY)
Troponin I (High Sensitivity): 11 ng/L (ref <18)
Troponin I (High Sensitivity): 13 ng/L (ref <18)

## 2023-03-06 LAB — CBC WITH DIFFERENTIAL/PLATELET
Abs Immature Granulocytes: 0.07 10*3/uL (ref 0.00–0.07)
Basophils Absolute: 0.1 10*3/uL (ref 0.0–0.1)
Basophils Relative: 1 %
Eosinophils Absolute: 0.1 10*3/uL (ref 0.0–0.5)
Eosinophils Relative: 1 %
HCT: 28.6 % — ABNORMAL LOW (ref 39.0–52.0)
Hemoglobin: 9 g/dL — ABNORMAL LOW (ref 13.0–17.0)
Immature Granulocytes: 1 %
Lymphocytes Relative: 12 %
Lymphs Abs: 1.6 10*3/uL (ref 0.7–4.0)
MCH: 27.6 pg (ref 26.0–34.0)
MCHC: 31.5 g/dL (ref 30.0–36.0)
MCV: 87.7 fL (ref 80.0–100.0)
Monocytes Absolute: 1.5 10*3/uL — ABNORMAL HIGH (ref 0.1–1.0)
Monocytes Relative: 11 %
Neutro Abs: 9.9 10*3/uL — ABNORMAL HIGH (ref 1.7–7.7)
Neutrophils Relative %: 74 %
Platelets: 367 10*3/uL (ref 150–400)
RBC: 3.26 MIL/uL — ABNORMAL LOW (ref 4.22–5.81)
RDW: 14.9 % (ref 11.5–15.5)
WBC: 13.1 10*3/uL — ABNORMAL HIGH (ref 4.0–10.5)
nRBC: 0 % (ref 0.0–0.2)

## 2023-03-06 LAB — LACTIC ACID, PLASMA: Lactic Acid, Venous: 1 mmol/L (ref 0.5–1.9)

## 2023-03-06 LAB — CULTURE, BAL-QUANTITATIVE W GRAM STAIN: Culture: >=100000 — AB

## 2023-03-06 MED ORDER — IOHEXOL 350 MG/ML SOLN
75.0000 mL | Freq: Once | INTRAVENOUS | Status: AC | PRN
  Administered 2023-03-06: 75 mL via INTRAVENOUS

## 2023-03-06 NOTE — ED Provider Notes (Signed)
[facility] Provider Note    Event Date/Time   First MD Initiated Contact with Patient 03/06/23 1658     (approximate)   History   Chief Complaint: Shortness of breath  HPI  Curtis Campos is a 82 y.o. male with a history of CHF, rectal cancer, chronic respiratory failure on 4 L nasal cannula oxygen at all times who comes to the ED complaining of worsening shortness of breath this morning.  He then had a coughing fit, coughed up some mucus, and is feeling improved now.  Denies any chest pain.  No pleuritic pain.  No fever or chills.  No hemoptysis.  Outside records reviewed noting 03/03/2023 patient recently had a bronchoscopy under anesthesia for biopsy of a lung mass.  He was found to have extensive mucous plugging which was lavaged and aspirated to clear his airways.  Pathology reveals poorly differentiated carcinoma.          Physical Exam   Triage Vital Signs: ED Triage Vitals  Encounter Vitals Group     BP 03/06/23 1608 (!) 91/56     Systolic BP Percentile --      Diastolic BP Percentile --      Pulse Rate 03/06/23 1608 71     Resp 03/06/23 1608 (!) 22     Temp 03/06/23 1608 97.6 F (36.4 C)     Temp Source 03/06/23 1608 Oral     SpO2 03/06/23 1608 90 %     Weight --      Height --      Head Circumference --      Peak Flow --      Pain Score 03/06/23 1609 0     Pain Loc --      Pain Education --      Exclude from Growth Chart --     Most recent vital signs: Vitals:   03/06/23 1930 03/06/23 2028  BP: 122/72   Pulse: 63   Resp: 20   Temp:  97.7 F (36.5 C)  SpO2: 99%     General: Awake, no distress.  CV:  Good peripheral perfusion.  Regular rate and rhythm Resp:  Normal effort.  Diminished breath sounds over left mid lung.  Otherwise clear lungs without crackles or wheezing.  No wheezing, normal expiratory phase with FEV1 maneuver. Abd:  No distention.  Soft nontender Other:  No lower extremity edema or calf  tenderness   ED Results / Procedures / Treatments   Labs (all labs ordered are listed, but only abnormal results are displayed) Labs Reviewed  COMPREHENSIVE METABOLIC PANEL - Abnormal; Notable for the following components:      Result Value   Sodium 131 (*)    Chloride 94 (*)    Glucose, Bld 113 (*)    BUN 26 (*)    Calcium 8.4 (*)    Total Protein 6.3 (*)    Albumin 2.8 (*)    AST 14 (*)    All other components within normal limits  CBC WITH DIFFERENTIAL/PLATELET - Abnormal; Notable for the following components:   WBC 13.1 (*)    RBC 3.26 (*)    Hemoglobin 9.0 (*)    HCT 28.6 (*)    Neutro Abs 9.9 (*)    Monocytes Absolute 1.5 (*)    All other components within normal limits  LACTIC ACID, PLASMA  TROPONIN I (HIGH SENSITIVITY)  TROPONIN I (HIGH SENSITIVITY)     EKG Interpreted by me Sinus rhythm  rate of 69.  Normal axis, normal intervals.  Poor R wave progression.  Normal ST segments and T waves   RADIOLOGY Chest x-ray interpreted by me, shows left lung mass consistent with known history.  No large pleural effusion or consolidation.  Radiology report reviewed   PROCEDURES:  Procedures   MEDICATIONS ORDERED IN ED: Medications  iohexol (OMNIPAQUE) 350 MG/ML injection 75 mL (75 mLs Intravenous Contrast Given 03/06/23 1824)     IMPRESSION / MDM / ASSESSMENT AND PLAN / ED COURSE  I reviewed the triage vital signs and the nursing notes.  DDx: Pneumonia, pleural effusion, pulmonary embolism, non-STEMI, mucous plugging  Patient's presentation is most consistent with acute presentation with potential threat to life or bodily function.  Patient presents with worsening shortness of breath this morning in the setting of recently diagnosed lung cancer which has not yet been treated, and recent anesthesia.  Elevated risk for PE.  Initial labs EKG chest x-ray are unremarkable.  Will obtain CT angiogram of the chest.   ----------------------------------------- 8:31 PM  on 03/06/2023 ----------------------------------------- Serial troponins normal.  CT angiogram of the chest is negative for acute findings.  Symptomatically patient is back to baseline, oxygen saturation is about 98% on 2 to 3 L nasal cannula.  Appears to be stable for discharge and continued outpatient follow-up.  Has appointment this week to discuss pathology findings.      FINAL CLINICAL IMPRESSION(S) / ED DIAGNOSES   Final diagnoses:  Shortness of breath  Chronic respiratory failure with hypoxia (HCC)     Rx / DC Orders   ED Discharge Orders     None        Note:  This document was prepared using Dragon voice recognition software and may include unintentional dictation errors.   Sharman Cheek, MD 03/06/23 2031

## 2023-03-06 NOTE — Discharge Instructions (Addendum)
Your lab tests and CT scan today were all okay.  There is no sign of pneumonia, blood clot, heart attack, or other acute issues.  In the lungs.  Please follow-up with your doctor this week as scheduled.

## 2023-03-06 NOTE — ED Notes (Addendum)
Pt up to bathroom with assistance. Walker currently in use, CCMD notified.

## 2023-03-06 NOTE — ED Triage Notes (Signed)
Pt here with shortness of breath onset this morning. Pt 88% on 4L [place] which he wears chronically. Pt family member also states pt has been hypotensive at home. Wednesday pt had a procedure to remove fluid off of his lungs.

## 2023-03-07 ENCOUNTER — Telehealth: Payer: Self-pay | Admitting: Cardiology

## 2023-03-07 NOTE — Telephone Encounter (Signed)
Patient's sister in law, Corrie Dandy, called the answering service this AM with questions regarding patient's BP. Patient was seen in the ED yesterday for shortness of breath, and his BP was down to 91/56 when he arrived to the ED. By the time he left the ED, his BP was 119/65. Per review of vital signs at patient's recent outpatient appointments, his BP has been running low. Was 86/44 on November 26th. Was 84/58 on October 29th   Patient's current medical regiment includes carvedilol 6.25 mg BID, farxiga 10 mg daily, entresto 49-51 mg BID, spironolactone 12.5 mg daily.   Today, patient's BP 119/68 with HR 60 BPM. This is before he has taken any blood pressure medications. Reports feeling well this AM. Discussed that patient can continue to take farxiga 10 mg daily, entresto 49/51 mg BID, and spironolactone 12.5 mg daily. Instructed him to hold his carvedilol for now. Also asked that patient keep a BP log for the next few days.   I will send this message to Dr. Kirke Corin as an Lorain Childes. Likely that patient would benefit from an in-person BP check/med adjustment in the next few weeks.   Jonita Albee, PA-C 03/07/2023 11:09 AM

## 2023-03-09 NOTE — Telephone Encounter (Signed)
Spoke with pt and scheduled appointment for 03/22/23 at 1:30 pm with Curtis Levering, NP.    Jonita Albee, PA-C  [facility] Triage2 days ago    Please arrange appointment with APP within the next 2 weeks  Thanks KJ

## 2023-03-10 ENCOUNTER — Encounter: Payer: Self-pay | Admitting: Oncology

## 2023-03-10 ENCOUNTER — Inpatient Hospital Stay: Payer: Medicare Other | Attending: Oncology | Admitting: Oncology

## 2023-03-10 ENCOUNTER — Other Ambulatory Visit: Payer: Self-pay | Admitting: *Deleted

## 2023-03-10 ENCOUNTER — Inpatient Hospital Stay: Payer: Medicare Other

## 2023-03-10 ENCOUNTER — Encounter: Payer: Self-pay | Admitting: *Deleted

## 2023-03-10 VITALS — BP 110/61 | HR 83 | Temp 96.2°F | Resp 18 | Ht 66.0 in | Wt 160.0 lb

## 2023-03-10 DIAGNOSIS — C3490 Malignant neoplasm of unspecified part of unspecified bronchus or lung: Secondary | ICD-10-CM | POA: Diagnosis not present

## 2023-03-10 DIAGNOSIS — Z87891 Personal history of nicotine dependence: Secondary | ICD-10-CM | POA: Diagnosis not present

## 2023-03-10 DIAGNOSIS — Z9221 Personal history of antineoplastic chemotherapy: Secondary | ICD-10-CM | POA: Diagnosis not present

## 2023-03-10 DIAGNOSIS — C771 Secondary and unspecified malignant neoplasm of intrathoracic lymph nodes: Secondary | ICD-10-CM | POA: Diagnosis present

## 2023-03-10 DIAGNOSIS — Z923 Personal history of irradiation: Secondary | ICD-10-CM | POA: Diagnosis not present

## 2023-03-10 DIAGNOSIS — Z933 Colostomy status: Secondary | ICD-10-CM | POA: Insufficient documentation

## 2023-03-10 DIAGNOSIS — C3412 Malignant neoplasm of upper lobe, left bronchus or lung: Secondary | ICD-10-CM | POA: Diagnosis present

## 2023-03-10 DIAGNOSIS — Z85048 Personal history of other malignant neoplasm of rectum, rectosigmoid junction, and anus: Secondary | ICD-10-CM | POA: Insufficient documentation

## 2023-03-10 DIAGNOSIS — C3492 Malignant neoplasm of unspecified part of left bronchus or lung: Secondary | ICD-10-CM | POA: Diagnosis not present

## 2023-03-10 MED ORDER — ALPRAZOLAM 0.25 MG PO TABS: 0.2500 mg | ORAL_TABLET | Freq: Two times a day (BID) | ORAL | 0 refills | Status: DC | PRN

## 2023-03-10 NOTE — Progress Notes (Signed)
[facility]  [phone] [phone]  ID: Curtis Campos OB: 1940/05/01  MR#: [id number]  BJS#:[id number]  Patient Care Team: Danella Penton, MD as PCP - General (Internal Medicine) Iran Ouch, MD as PCP - Cardiology (Cardiology) Iran Ouch, MD as Consulting Physician (Cardiology) Glory Buff, RN as Oncology Nurse Navigator Orlie Dakin, Tollie Pizza, MD as Consulting Physician (Oncology)  CHIEF COMPLAINT: Stage IIa poorly differentiated adenocarcinoma of the left lung.    INTERVAL HISTORY: Patient returns to clinic today for further evaluation, discussion of his pathology results, and treatment planning.  He continues to have weakness and fatigue, cough, and shortness of breath. He has no neurologic complaints.  He denies any recent fevers.  He has a good appetite and denies weight loss.  He has no chest pain or hemoptysis.  He denies any nausea, vomiting, constipation, or diarrhea.  He has no melena or hematochezia in his colostomy bag.  He has no urinary complaints.  Patient offers no further specific complaints today.  REVIEW OF SYSTEMS:   Review of Systems  Constitutional:  Positive for malaise/fatigue. Negative for fever.  HENT:  Positive for congestion.   Respiratory:  Positive for cough and shortness of breath.   Cardiovascular: Negative.  Negative for chest pain and leg swelling.  Gastrointestinal: Negative.  Negative for abdominal pain, blood in stool and melena.  Genitourinary: Negative.  Negative for dysuria.  Musculoskeletal: Negative.  Negative for back pain.  Skin: Negative.  Negative for rash.  Neurological:  Positive for weakness. Negative for dizziness, focal weakness and headaches.  Psychiatric/Behavioral: Negative.  The patient is not nervous/anxious.     As per HPI. Otherwise, a complete review of systems is negative.  PAST MEDICAL HISTORY: Past Medical History:  Diagnosis Date   Agent orange exposure    Anemia     Anxiety    Aortic atherosclerosis (HCC)    Aortic stenosis 07/04/2013   a.)TTE 07/04/13: mild (MPG 12; AVA 1.6); b.) TTE 01/01/16: mod (MPG 14; AVA 1.17); c.) TTE 08/30/17: mod (MPG 15; AVA 1.25); d.) TTE 07/13/19: mod (MPG 19; AVA 1.16); e.) TTE 07/23/20: mod (MPG 22; AVA 1.25); f.) TTE 03/12/2021: mod-sev (MPH 34; AVA 0.75); g.) TTE 05/21/2021: mod (MPG 22; AVA 0.8); h.) RHC 08/27/2020: sev (MPG 26.18; AVA 0.69); i.) s/p TAVR 11/25/21: 26 mm Edwards S3 Ultra Resilia   Arthritis 03/12/2011   Cardiomegaly    Cerebral microvascular disease    Cholelithiasis    Chronic hyponatremia    Chronic obstructive lung disease (HCC)    Colostomy in place [facility])    a.) s/p resection secondary to rectal cancer   Coronary artery disease 03/2013   a.) LHC 04/10/2013: 10% mLAD, 50% D1, 30% D2, 99% mLCx, 50% OM1, 40% pRCA, 30% mRCA, 60% RPDA, 40% RPLS - med mgmt; b.) MV 08/31/2019: basal inflat/mid inflat defect c/w ischemia in LCx distribution. EF 37%; c.) R/LHC 08/27/2020: 50% D1, 50% D2, 90% mLCx, 40% p-mRCA, 80% RPDA - med mgmt   Former smoker    HFrEF (heart failure with reduced ejection fraction) (HCC)    a.) TEE 04/13/13: EF 20-25, glob HK, G1DD, mild LAE, mod-sev MR; b.) TTE 08/30/17: EF 45-50, LVH, mild LAE, G1DD, mod AS, mod MR; c.) TTE 07/13/19: EF 30-35, glob HK, G1DD, RVSP 49.4, mild LAE, mild-mod MR, mod AS; d.) TTE 03/12/21: EF 55, G2DD, mild MR, mod-sev AS; e.) TTE 12/24/21: EF 50, mild LVH, G1DD, RVSP 40.5, mild-mod MR, triv AR; f.) TTE  11/18/22: EF 45-50, glob HK, G1DD, mild MR   History of bilateral cataract extraction    Hyperlipidemia    Hypertension    Hypokalemia    Ischemic cardiomyopathy    a.) TTE 04/08/2014: EF 25-30%; b.) TEE 04/13/2013: EF 20-25%; c.) TTE 07/04/2013: EF 30-35%; d.) TTE 01/01/2016: EF 40-45%; e.) TTE 08/30/2017: EF 45-50%; f.) TTE 07/03/2019: EF 30-35%; g.) MV 08/31/2019: EF 37%; h.) TTE 07/23/20: EF 55-60%; i.) TTE 03/12/2021: EF 55%; j.) TTE 05/21/2021: EF 40-45%; k.)  TTE 11/26/2021: EF 45-50%; l.) TTE 12/24/2021: EF 50%; m.) TTE 11/18/2022: EF 45-50%   Long term (current) use of aspirin    Mass of upper lobe of left lung 03/10/2021   a.) CTA chest 03/10/2021: 6.1 mm; b.) CTA chest 10/15/2021: 8 mm; c.) PET CT 11/14/2021: 8 mm (SUV max 2.8); d.) CXR 10/05/2022: interval increase to 2.7 cm; e.) CXR 01/18/2023: interval increase to 3.7 cm; f.) CT chest 01/22/2023: 3.8 x 3.2 cm; g.) PET CT 02/01/2023: 3.8 cm and markedly hypermetabolic with SUV max of 43   Multifocal atrial tachycardia (HCC)    Myocardial infarction [facility])    a.) suspected by cardiology during 03/2013 admission for CP/CAP/new onset CHF --> TTE  showed severe LV dysfunction with segmental wall motion abnormalities and severe MR (posteriorly-directed  jet)   Pneumonia    Pre-diabetes    Pulmonary hypertension (HCC)    a.) TTE 04/08/2013: RVSP 62.2; b.) TTE 07/13/2019: RVSP 49.4; c.) TTE 07/23/2020: RVSP 33.2; d.) TTE 11/26/2021: RVSP 41.2; e.) TTE 12/24/2021: RVSP 40.5; f.) TTE 11/18/2019: RVSP 29.8   Rectal cancer (HCC)    Requires continuous at home supplemental oxygen    a.) 4L/[place]   S/P TAVR (transcatheter aortic valve replacement) 11/25/2021   a.) 26mm Edwards Sapien 3 Ultra Resilia via the TF approach   Statin intolerance    Umbilical hernia (s/p repair)     PAST SURGICAL HISTORY: Past Surgical History:  Procedure Laterality Date   APPENDECTOMY  06/29/2003   CATARACT EXTRACTION, BILATERAL Bilateral    COLOSTOMY     HERNIA REPAIR     INTRAOPERATIVE TRANSTHORACIC ECHOCARDIOGRAM N/A 11/25/2021   Procedure: INTRAOPERATIVE TRANSTHORACIC ECHOCARDIOGRAM;  Surgeon: Kathleene Hazel, MD;  Location: [facility] OR;  Service: Open Heart Surgery;  Laterality: N/A;   resection of rectum     RIGHT/LEFT HEART CATH AND CORONARY ANGIOGRAPHY Bilateral 04/10/2013   Procedure: RIGHT/LEFT HEART CATH AND CORONARY ANGIOGRAPHY; Location: [facility]; Surgeon: Lorine Bears, MD   RIGHT/LEFT HEART CATH AND CORONARY  ANGIOGRAPHY N/A 08/27/2021   Procedure: RIGHT/LEFT HEART CATH AND CORONARY ANGIOGRAPHY;  Surgeon: Tonny Bollman, MD;  Location: [facility] INVASIVE CV LAB;  Service: Cardiovascular;  Laterality: N/A;   TRANSCATHETER AORTIC VALVE REPLACEMENT, TRANSFEMORAL Left 11/25/2021   Procedure: Transcatheter Aortic Valve Replacement, Transfemoral using a 26 MM Edwards SAPIEN 3 Ultra.;  Surgeon: Kathleene Hazel, MD;  Location: [facility] OR;  Service: Open Heart Surgery;  Laterality: Left;   VIDEO BRONCHOSCOPY WITH ENDOBRONCHIAL ULTRASOUND N/A 03/03/2023   Procedure: VIDEO BRONCHOSCOPY WITH ENDOBRONCHIAL ULTRASOUND;  Surgeon: [name], MD;  Location: [facility] ORS;  Service: Thoracic;  Laterality: N/A;    FAMILY HISTORY: Family History  Problem Relation Age of Onset   Heart disease Mother    Heart disease Father    Hyperlipidemia Father    Hypertension Father    Heart disease Sister        stents placed    Cancer - Cervical Sister    Heart disease Sister  CABG   Heart attack Brother 24       MI    ADVANCED DIRECTIVES (Y/N):  N  HEALTH MAINTENANCE: Social History   Tobacco Use   Smoking status: Former    Current packs/day: 0.00    Average packs/day: 1 pack/day for 50.0 years (50.0 ttl pk-yrs)    Types: Cigarettes    Start date: 03/26/1963    Quit date: 03/25/2013    Years since quitting: 9.9   Smokeless tobacco: Never  Vaping Use   Vaping status: Never Used  Substance Use Topics   Alcohol use: No    Alcohol/week: 0.0 standard drinks of alcohol   Drug use: No     Colonoscopy:  PAP:  Bone density:  Lipid panel:  Allergies  Allergen Reactions   Codeine Nausea Only   Statins Other (See Comments)    REACTION: maylgias    Current Outpatient Medications  Medication Sig Dispense Refill   albuterol (PROVENTIL) (2.5 MG/3ML) 0.083% nebulizer solution Take 2.5 mg by nebulization at bedtime.     albuterol (VENTOLIN HFA) 108 (90 Base) MCG/ACT inhaler INHALE 2 PUFFS BY MOUTH EVERY 6  HOURS AS NEEDED FOR SHORTNESS OF BREATH (Patient taking differently: 1-2 puffs every 6 (six) hours as needed. INHALE 2 PUFFS BY MOUTH EVERY 6 HOURS AS NEEDED FOR SHORTNESS OF BREATH) 18 g 0   Alirocumab (PRALUENT) 75 MG/ML SOAJ Inject 1 pen into the skin every 14 (fourteen) days. 2 pen 11   aspirin 81 MG tablet Take 81 mg by mouth at bedtime.     cholecalciferol (VITAMIN D3) 25 MCG (1000 UNIT) tablet Take 1,000 Units by mouth daily.     dapagliflozin propanediol (FARXIGA) 10 MG TABS tablet TAKE ONE TABLET BY MOUTH EVERY MORNING BEFORE BREAKFAST 30 tablet 6   diclofenac Sodium (VOLTAREN) 1 % GEL 2 g as needed.     ezetimibe (ZETIA) 10 MG tablet Take 1 tablet by mouth once daily for cholesterol. NEED APPOINTMENT FOR ANY MORE REFILLS (Patient taking differently: Take 10 mg by mouth at bedtime. Take 1 tablet by mouth once daily for cholesterol. NEED APPOINTMENT FOR ANY MORE REFILLS) 90 tablet 1   fluticasone-salmeterol (ADVAIR) 250-50 MCG/ACT AEPB Inhale 1 puff into the lungs in the morning and at bedtime.     Magnesium Oxide 400 (240 Mg) MG TABS TAKE 1 TABLET BY MOUTH ONCE DAILY. (Patient taking differently: Take 400 mg by mouth daily.) 90 tablet 3   OXYGEN Inhale 4 L into the lungs continuous.     sacubitril-valsartan (ENTRESTO) 49-51 MG Take half a tablet twice daily 90 tablet 1   sodium chloride 1 g tablet Take 1 g by mouth daily with lunch.     traZODone (DESYREL) 50 MG tablet Take 1 tablet (50 mg total) by mouth at bedtime as needed. for sleep 90 tablet 0   TRELEGY ELLIPTA 100-62.5-25 MCG/ACT AEPB Inhale 1 puff into the lungs as needed.     ALPRAZolam (XANAX) 0.25 MG tablet Take 1 tablet (0.25 mg total) by mouth 2 (two) times daily as needed for anxiety. 60 tablet 0   amoxicillin (AMOXIL) 500 MG tablet Take 4 tablets (2,000 mg total) by mouth as directed. 1 HOUR PRIOR TO DENTAL APPOINTMENTS (Patient not taking: Reported on 03/10/2023) 12 tablet 6   carvedilol (COREG) 6.25 MG tablet Take 6.25 mg by  mouth 2 (two) times daily with a meal. (Patient not taking: Reported on 03/10/2023)     spironolactone (ALDACTONE) 25 MG tablet Take 0.5 tablets (  12.5 mg total) by mouth daily. (Patient taking differently: Take 12.5 mg by mouth every morning.) 45 tablet 3   torsemide (DEMADEX) 20 MG tablet Take 20 mg by mouth daily. (Patient not taking: Reported on 03/10/2023)     No current facility-administered medications for this visit.    OBJECTIVE: Vitals:   03/10/23 1042  BP: 110/61  Pulse: 83  Resp: 18  Temp: (!) 96.2 F (35.7 C)  SpO2: 97%     Body mass index is 25.82 kg/m.    ECOG FS:1 - Symptomatic but completely ambulatory  General: Well-developed, well-nourished, no acute distress.  Sitting in a wheelchair. Eyes: Pink conjunctiva, anicteric sclera. HEENT: Normocephalic, moist mucous membranes. Lungs: No audible wheezing or coughing. Heart: Regular rate and rhythm. Abdomen: Soft, nontender, no obvious distention. Musculoskeletal: No edema, cyanosis, or clubbing. Neuro: Alert, answering all questions appropriately. Cranial nerves grossly intact. Skin: No rashes or petechiae noted. Psych: Normal affect.    LAB RESULTS:  Lab Results  Component Value Date   NA 131 (L) 03/06/2023   K 3.9 03/06/2023   CL 94 (L) 03/06/2023   CO2 26 03/06/2023   GLUCOSE 113 (H) 03/06/2023   BUN 26 (H) 03/06/2023   CREATININE 1.03 03/06/2023   CALCIUM 8.4 (L) 03/06/2023   PROT 6.3 (L) 03/06/2023   ALBUMIN 2.8 (L) 03/06/2023   AST 14 (L) 03/06/2023   ALT 16 03/06/2023   ALKPHOS 107 03/06/2023   BILITOT 0.6 03/06/2023   GFRNONAA >60 03/06/2023   GFRAA 79 04/04/2020    Lab Results  Component Value Date   WBC 13.1 (H) 03/06/2023   NEUTROABS 9.9 (H) 03/06/2023   HGB 9.0 (L) 03/06/2023   HCT 28.6 (L) 03/06/2023   MCV 87.7 03/06/2023   PLT 367 03/06/2023     STUDIES: CT Angio Chest PE W and/or Wo Contrast  Result Date: 03/06/2023 CLINICAL DATA:  Shortness of breath, evaluate for PE  EXAM: CT ANGIOGRAPHY CHEST WITH CONTRAST TECHNIQUE: Multidetector CT imaging of the chest was performed using the standard protocol during bolus administration of intravenous contrast. Multiplanar CT image reconstructions and MIPs were obtained to evaluate the vascular anatomy. RADIATION DOSE REDUCTION: This exam was performed according to the departmental dose-optimization program which includes automated exposure control, adjustment of the mA and/or kV according to patient size and/or use of iterative reconstruction technique. CONTRAST:  75mL OMNIPAQUE IOHEXOL 350 MG/ML SOLN COMPARISON:  Chest radiograph dated 03/06/2023. CT chest dated 03/01/2023. FINDINGS: Cardiovascular: Satisfactory opacification of the bilateral pulmonary arteries to the segmental level. No evidence of pulmonary embolism. Status post TAVR. Although not tailored for evaluation of the thoracic aorta, there is no evidence of thoracic aortic aneurysm or dissection. The heart is normal in size.  No pericardial effusion. Moderate three-vessel coronary atherosclerosis. Mediastinum/Nodes: Mediastinal lymphadenopathy, including a dominant 17 mm short axis subcarinal node (series 4/image 36), unchanged. Visualized thyroid is unremarkable. Lungs/Pleura: 4.7 x 3.5 cm posterior left upper lobe mass (series 5/image 49), corresponding to the patient's known primary bronchogenic carcinoma, grossly unchanged. Mild lingular scarring. Bilateral upper lobe pleural-parenchymal scarring. Mild centrilobular and paraseptal emphysematous changes, upper lung predominant. No pleural effusion or pneumothorax. Upper Abdomen: Visualized upper abdomen is notable for tiny layering gallstones and vascular calcifications. Musculoskeletal: Degenerative changes of the visualized thoracolumbar spine. Review of the MIP images confirms the above findings. IMPRESSION: No evidence of pulmonary embolism. Otherwise unchanged from recent CT. Left upper lobe mass, corresponding to the  patient's known primary bronchogenic carcinoma. Associated mediastinal nodal metastases. Aortic  Atherosclerosis (ICD10-I70.0) and Emphysema (ICD10-J43.9). Electronically Signed   By: Charline Bills M.D.   On: 03/06/2023 18:42   DG Chest 2 View  Result Date: 03/06/2023 CLINICAL DATA:  Short of breath, hypotension EXAM: CHEST - 2 VIEW COMPARISON:  03/03/2023, 02/01/2023 FINDINGS: Frontal and lateral views of the chest are obtained on 3 images. The cardiac silhouette is unremarkable. Aortic valve prosthesis again noted. 3.5 cm left upper lobe mass again noted, consistent with malignancy based on previous PET scan. Chronic interstitial scarring without airspace disease, effusion, or pneumothorax. No acute bony abnormalities. IMPRESSION: 1. Stable left upper lobe mass consistent with malignancy as per previous PET scan. Please correlate with recent bronchoscopy results. 2. No acute airspace disease. Electronically Signed   By: Sharlet Salina M.D.   On: 03/06/2023 16:36   CT SUPER D CHEST WO [place] PILOT  Result Date: 03/05/2023 CLINICAL DATA:  Pre-procedure planning. Left upper lobe pulmonary mass. EXAM: CT CHEST WITHOUT CONTRAST TECHNIQUE: Multidetector CT imaging of the chest was performed using thin slice collimation for electromagnetic bronchoscopy planning purposes, without intravenous contrast. RADIATION DOSE REDUCTION: This exam was performed according to the departmental dose-optimization program which includes automated exposure control, adjustment of the mA and/or kV according to patient size and/or use of iterative reconstruction technique. COMPARISON:  PET-CT 02/01/2023 FINDINGS: Cardiovascular: The heart size is normal. No substantial pericardial effusion. Coronary artery calcification is evident. Moderate atherosclerotic calcification is noted in the wall of the thoracic aorta. Status post TAVR. Mediastinum/Nodes: 12 mm short axis subcarinal lymph node evident, similar to 01/22/2023 CT. No  evidence for gross hilar lymphadenopathy although assessment is limited by the lack of intravenous contrast on the current study. The esophagus has normal imaging features. There is no axillary lymphadenopathy. Lungs/Pleura: Biapical pleuroparenchymal scarring again noted. Posterior left upper lobe pulmonary mass is similar at 4.1 cm today and abuts the major fissure. No new suspicious pulmonary nodule or mass. No pleural effusion. Upper Abdomen: Visualized portion of the upper abdomen shows no acute findings. Musculoskeletal: No worrisome lytic or sclerotic osseous abnormality. IMPRESSION: 1. Posterior left upper lobe pulmonary mass is similar at 4.1 cm today and abuts the major fissure. 2. 12 mm short axis subcarinal lymph node, similar to 01/22/2023 CT. 3. No new suspicious pulmonary nodule or mass. 4.  Aortic Atherosclerosis (ICD10-I70.0). Electronically Signed   By: Kennith Center M.D.   On: 03/05/2023 11:29   DG Chest Port 1 View  Result Date: 03/03/2023 CLINICAL DATA:  Status post bronchoscopy. EXAM: PORTABLE CHEST 1 VIEW COMPARISON:  January 18, 2023. FINDINGS: The heart size and mediastinal contours are within normal limits. No pneumothorax or pleural effusion is noted. Left upper lobe mass is again noted. The visualized skeletal structures are unremarkable. IMPRESSION: No pneumothorax status post bronchoscopy. Electronically Signed   By: Lupita Raider M.D.   On: 03/03/2023 15:55   DG C-Arm 1-60 Min-No Report  Result Date: 03/03/2023 Fluoroscopy was utilized by the requesting physician.  No radiographic interpretation.    ASSESSMENT: Stage IIa poorly differentiated adenocarcinoma of the left lung.    PLAN:    Stage IIa poorly differentiated adenocarcinoma of the left lung: Diagnosis confirmed on biopsy from March 03, 2023.  PET scan results from February 01, 2023 reviewed independently confirming stage of disease.  Patient is likely not a surgical candidate, but will benefit from XRT.  We  discussed the possibility of using concurrent chemotherapy, but given his multiple comorbidities ultimately decided to pursue radiation treatments only.  A  referral was given to radiation oncology today.  Will get MRI of the brain to complete the staging workup.  Return to clinic towards the end of radiation for further evaluation.   Rectal cancer: Diagnosed in approximately 2004.  Patient reports he underwent chemotherapy and radiation therapy.  He also has a permanent colostomy.  CEA was ordered for completeness. Hyponatremia: Mildly improved.  Patient's most recent sodium levels 131. Leukocytosis: Likely reactive, monitor. Anemia: Patient's hemoglobin has trended down to 9.0, monitor. Anxiety/adjustment: Patient was given a prescription for Xanax today as well as a referral to [facility]. Cough: Continue current medications as prescribed.  Patient expressed understanding and was in agreement with this plan. He also understands that He can call clinic at any time with any questions, concerns, or complaints.    Cancer Staging  Adenocarcinoma of left lung [facility]) Staging form: Lung, AJCC 8th Edition - Clinical stage from 03/10/2023: Stage IIA (cT2b, cN0, cM0) - Signed by Jeralyn Ruths, MD on 03/10/2023 Stage prefix: Initial diagnosis   Jeralyn Ruths, MD   03/10/2023 12:15 PM

## 2023-03-10 NOTE — Progress Notes (Signed)
Met with patient and family during follow up visit with Dr. Orlie Dakin. All questions answered during visit. Reviewed upcoming appts. Instructed to call with any questions or needs. Pt and family verbalized understanding.

## 2023-03-10 NOTE — Progress Notes (Signed)
[facility] Clinical Social Work  Initial Assessment   Curtis Campos is a 82 y.o. year old male contacted by phone. Clinical Social Work was referred by medical provider for assessment of psychosocial needs.   SDOH (Social Determinants of Health) assessments performed: Yes SDOH Interventions    Flowsheet Row Office Visit from 01/26/2023 in [facility] - A Dept Of [place]. [facility]  SDOH Interventions   Food Insecurity Interventions Intervention Not Indicated  Housing Interventions Intervention Not Indicated  Transportation Interventions Intervention Not Indicated  Utilities Interventions Intervention Not Indicated  Financial Strain Interventions Intervention Not Indicated       SDOH Screenings   Food Insecurity: No Food Insecurity (01/26/2023)  Housing: Low Risk  (01/26/2023)  Transportation Needs: No Transportation Needs (01/26/2023)  Utilities: Not At Risk (01/26/2023)  Depression (PHQ2-9): Low Risk  (01/26/2023)  Financial Resource Strain: Low Risk  (01/26/2023)  Tobacco Use: Medium Risk (03/10/2023)     Distress Screen completed: No     No data to display            Family/Social Information:  Housing Arrangement: patient lives with his wife, [name]. Family members/support persons in your life? Family Transportation concerns: yes.  Patient's wife has macular degeneration and cannot drive patient to his treatments. Employment: Retired  Income source: Product manager and [employer] concerns: Yes, current concerns Type of concern: None Food access concerns: no Religious or spiritual practice: No Services Currently in place:  [employer] and [employer].  Coping/ Adjustment to diagnosis: Patient understands treatment plan and what happens next? 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 Provided CSW contact information and encouraged patient to call with any questions or concerns Provided patient with information about the possibility of Medicare and VA transportation benefits.  CSW to investigate and inform patient.  He also stated he has anxiety from shortness of breath and receiving his cancer diagnosis.  Explored tools to help with anxiety.  He said he is supposed to obtain his Xanax on Friday.    Follow Up Plan: CSW will follow-up with patient by phone  Patient verbalizes understanding of plan: Yes    Dorothey Baseman, LCSW Clinical Social Worker [facility]

## 2023-03-12 ENCOUNTER — Inpatient Hospital Stay: Payer: Medicare Other

## 2023-03-12 NOTE — Progress Notes (Signed)
[facility] CSW Progress Note  Patient's wife, [name], contacted Visual merchandiser.  She stated she contacted Medicare and was told patient does not have a transportation benefit for his treatment.  CSW informed her that the [facility] transportation department was contacted via email and was waiting for confirmation.    Dorothey Baseman, LCSW Clinical Social Worker [facility]

## 2023-03-15 ENCOUNTER — Ambulatory Visit
Admission: RE | Admit: 2023-03-15 | Payer: No Typology Code available for payment source | Source: Ambulatory Visit | Admitting: Radiation Oncology

## 2023-03-15 ENCOUNTER — Telehealth: Payer: Self-pay

## 2023-03-16 ENCOUNTER — Telehealth: Payer: Self-pay

## 2023-03-16 ENCOUNTER — Ambulatory Visit
Admission: RE | Admit: 2023-03-16 | Discharge: 2023-03-16 | Disposition: A | Discharge status: Home / Self Care | Payer: Medicare Other | Source: Ambulatory Visit | Attending: Oncology | Admitting: Oncology

## 2023-03-16 DIAGNOSIS — C3492 Malignant neoplasm of unspecified part of left bronchus or lung: Secondary | ICD-10-CM | POA: Insufficient documentation

## 2023-03-16 MED ORDER — GADOBUTROL 1 MMOL/ML IV SOLN
7.0000 mL | Freq: Once | INTRAVENOUS | Status: AC | PRN
  Administered 2023-03-16: 7 mL via INTRAVENOUS

## 2023-03-16 NOTE — Telephone Encounter (Signed)
Clinical Social Worker attempted to contact patient's wife, [name], to discuss transportation to treatment.  CSW left a vm for  the [facility], [employer], [employer] [phone] [email or web address]

## 2023-03-21 NOTE — Progress Notes (Unsigned)
Cardiology Clinic Note   Date: 03/22/2023 ID: Jaxxton, Steidinger [birth year], MRN [id number]  Primary Cardiologist:  Lorine Bears, MD  Patient Profile    TORREY Campos is a 82 y.o. male who presents to the clinic today for hypotension.     Past medical history significant for: CAD. R/LHC 04/10/2013: Mid LAD 10%.  D1 50%.  D2 30%.  Mid LCx 99%.  OM1 50%.  Proximal RCA 40%.  Mid RCA 30%.  RPDA 60%.  RPLS 40%.  Mildly elevated LVEDP.  Normal cardiac output, moderately elevated PCWP, mildly elevated pulmonary vascular resistance. R/LHC 08/27/2021: Severe single-vessel CAD with severe stenosis of mid LCx unchanged from 2015.  Mild nonobstructive LAD plaquing in mild ostial diagonal stenosis.  Mild nonobstructive mid RCA stenosis and severe distal PDA stenosis unchanged from previous.  Severe low-flow low gradient aortic stenosis with mean transvalvular gradient 26 mmHg.  Aortic valve is calcified and restricted on plain fluoroscopy. Chronic systolic heart failure. Echo 11/18/2022: EF 45 to 50%.  Global hypokinesis.  Grade 1 DD.  Elevated LVEDP.  Normal RV size/function.  Normal PA pressure.  Mild MR.  Normal structure and function of aortic valve prosthesis.  No aortic valve regurgitation or stenosis, mean gradient 6 mmHg. Severe aortic stenosis. TAVR 11/25/2021. Multifocal A. tach. Hypertension. Hyperlipidemia. COPD. Left lung cancer. Rectal cancer. Tobacco abuse.  In summary, Mr. [name] is a long time patient of cardiology.  Echo in 2015 showed LVEF 25%, moderate MR.  R/LHC showed severe one-vessel CAD with subtotal occlusion of the mid LCx which appeared to be chronic.  Repeat echo October 2017 showed LVEF 40 to 45%.  He had worsening heart failure in 2021.  Echo at that time showed EF 30 to 35%, moderate aortic stenosis, mild to moderate MR and moderate pulmonary hypertension.  Nuclear stress test showed evidence of ischemia and the LCx distribution.  Lasix was increased, low-dose  spironolactone was added and losartan was switched to Entresto.  He had improvement in his symptoms and repeat echo in April 2022 showed LVEF 55 to 60% with moderate aortic stenosis.  He had progression of aortic stenosis to severe.  [facility] May 2023 showed severe single-vessel CAD unchanged from prior cath in 2015 and severe low-flow low gradient aortic stenosis.  Patient underwent TAVR in August 2023.  Echo September 2023 showed LVEF 50%, mild pulmonary hypertension, mild to moderate MR and normal functioning TAVR prosthesis.  1 year post-TAVR echo in August 2024 showed EF 45 to 50% (further details above).     History of Present Illness    Curtis Campos is followed by Dr. Kirke Corin for the above outlined history.  Patient was last seen in the office by Dr. Kirke Corin on 02/23/2023 for preop evaluation.  He had recently been diagnosed with a mass in the lower lobe of his left lung and was pending bronchoscopy and biopsy.  He was hypotensive at the time of his visit and Entresto was decreased.  Patient presented to the ED on 03/06/2023 with complaints of shortness of breath.  SpO2 88% on 4 L home O2.  He was hypotensive.  Workup was unremarkable.  Chest x-ray was without acute process.  CTA was negative for PE.  Normal troponin x 2.  He was discharged at baseline.  Patient's sister-in-law contacted after-hours cardiology with concerns of hypotension.  Patient's BP prior to 90 medications at the time of contact was 119/68, HR 60 bpm.  He was instructed to hold carvedilol and maintain BP  log until follow-up.  Today, patient is accompanied by his wife. He reports BP still running low despite holding carvedilol. He states when SBP gets below 100 he feels lightheaded. He is 4 L continuous O2 supplementation. He is pending 6 weeks of radiation for lung cancer. He denies chest pain, pressure or tightness. He gets some lower extremity edema typically at its best in the morning and progressing throughout the day. Daily  weight has been stable. He takes torsemide every other day with brisk diuresis. He has no complaints today other than his low BP. Discussed close follow up of BP with patient and wife. Unfortunately, they depend on their church family for transportation and with his upcoming radiation (5 days a week for 6 weeks) he will be unable to follow up. He agrees to take BP at home and send 1 week log in to ensure BP is better controlled with medication changes.      ROS: All other systems reviewed and are otherwise negative except as noted in History of Present Illness.  Studies Reviewed    EKG is not ordered today.   Physical Exam    VS:  BP (!) 102/57 (BP Location: Left Arm, Patient Position: Sitting, Cuff Size: Normal)   Pulse 90   Ht 5\' 6"  (1.676 m)   Wt 160 lb (72.6 kg)   SpO2 93%   BMI 25.82 kg/m  , BMI Body mass index is 25.82 kg/m.  GEN: Well nourished, well developed, in no acute distress. Neck: No JVD or carotid bruits. Cardiac:  RRR. No murmurs. No rubs or gallops.   Respiratory:  Respirations regular and unlabored. Diminished breath sounds bilaterally without rales, wheezing or rhonchi. O2 in place. GI: Soft, nontender, nondistended. Extremities: Radials/DP/PT 2+ and equal bilaterally. No clubbing or cyanosis. Mild non-pitting edema bilateral lower extremities.   Skin: Warm and dry, no rash. Neuro: Strength intact.  Assessment & Plan   CAD Stable severe single-vessel CAD in mid LCx per angiography May 2023 found to be unchanged from January 2015.  Patient denies chest pain, pressure or tightness.  -Continue aspirin, Praluent, Zetia.  Chronic HFrEF Echo August 2024 showed EF 45 to 50%, global hypokinesis, Grade I DD.  Patient with baseline shortness of breath. He is on 4 L continuous O2 supplementation. He is taking torsemide every other day with brisk diuresis. Daily weight stable. Mild, nonpitting lower extremity edema. Diminished breath sounds bilaterally otherwise  euvolemic and well compensated on exam. -Continue torsemide, Entresto.  -Continue to hold carvedilol.  -Stop spironolactone.  -Take extra dose of torsemide for increased edema or weight.   Severe aortic stenosis s/p TAVR August 2023 Echo August 2024 showed normal structure and function of aortic valve prosthesis without regurgitation or stenosis, mean gradient 6 mmHg.  -Repeat echo as clinically indicated.  Hypotension BP today 102/57. Patient typically feels lightheaded when SBP <100. Patient is pending 6 weeks of radiation. He will not be able to come in for BP recheck. He is instructed to keep home log and send in.  -Continue Entresto.  -Continue to hold carvedilol.  -Stop spironolactone.  -Send in 1-1.5 week BP log.   Disposition: Stop spironolactone. Send in 1 week BP log. Return in 6 weeks or sooner as needed.          Signed, Etta Grandchild. Ilani Otterson, DNP, NP-C

## 2023-03-22 ENCOUNTER — Telehealth: Payer: Self-pay | Admitting: Cardiovascular Disease

## 2023-03-22 ENCOUNTER — Encounter: Payer: Self-pay | Admitting: Student

## 2023-03-22 ENCOUNTER — Ambulatory Visit: Payer: Medicare Other | Attending: Student | Admitting: Student

## 2023-03-22 ENCOUNTER — Inpatient Hospital Stay: Payer: Medicare Other

## 2023-03-22 VITALS — BP 102/57 | HR 90 | Ht 66.0 in | Wt 160.0 lb

## 2023-03-22 DIAGNOSIS — I2510 Atherosclerotic heart disease of native coronary artery without angina pectoris: Secondary | ICD-10-CM

## 2023-03-22 DIAGNOSIS — Z952 Presence of prosthetic heart valve: Secondary | ICD-10-CM

## 2023-03-22 DIAGNOSIS — I959 Hypotension, unspecified: Secondary | ICD-10-CM | POA: Diagnosis not present

## 2023-03-22 DIAGNOSIS — I5022 Chronic systolic (congestive) heart failure: Secondary | ICD-10-CM

## 2023-03-22 NOTE — Patient Instructions (Signed)
Medication Instructions:  Your physician recommends the following medication changes.  STOP TAKING: Spironolactone   *If you need a refill on your cardiac medications before your next appointment, please call your pharmacy*   Lab Work: No labs ordered today    Testing/Procedures: No test ordered today    Follow-Up: At [facility], you and your health needs are our priority.  As part of our continuing mission to provide you with exceptional heart care, we have created designated Provider Care Teams.  These Care Teams include your primary Cardiologist (physician) and Advanced Practice Providers (APPs -  Physician Assistants and Nurse Practitioners) who all work together to provide you with the care you need, when you need it.  We recommend signing up for the patient portal called "MyChart".  Sign up information is provided on this After Visit Summary.  MyChart is used to connect with patients for Virtual Visits (Telemedicine).  Patients are able to view lab/test results, encounter notes, upcoming appointments, etc.  Non-urgent messages can be sent to your provider as well.   To learn more about what you can do with MyChart, go to [email or web address].    Your next appointment:   6 week(s)  Provider:   You may see Lorine Bears, MD or one of the following Advanced Practice Providers on your designated Care Team:   Nicolasa Ducking, NP Eula Listen, PA-C Cadence Fransico Michael, PA-C Charlsie Quest, NP Carlos Levering, NP    Carlos Levering, NP recommends checking and keeping a log of your blood pressure daily. Then in 1 week, please call or send a mychart message with updated blood pressure readings.   It is best to check your blood pressure 1-2 hours after taking your medications.  Below are some tips that our clinical pharmacists share for home BP monitoring:          Rest 10 minutes before taking your blood pressure.          Don't smoke or drink caffeinated beverages  for at least 30 minutes before.          Take your blood pressure before (not after) you eat.          Sit comfortably with your back supported and both feet on the floor (don't cross your legs).          Elevate your arm to heart level on a table or a desk.          Use the proper sized cuff. It should fit smoothly and snugly around your bare upper arm. There should be enough room to slip a fingertip under the cuff. The bottom edge of the cuff should be 1 inch above the crease of the elbow.

## 2023-03-22 NOTE — Progress Notes (Signed)
[facility] CSW Progress Note  Visual merchandiser spoke with patient's wife, [name], regarding transportation.  Informed her that the [facility] has been called and emailed and CSW has not received a response.  Provided her with contact information for [facility].  Also faxed the [employer] a request for transportation with her permission.  [name] stated that their church has offered to help with transportation, as has patient's nephew.    Dorothey Baseman, LCSW Clinical Social Worker [facility]

## 2023-03-22 NOTE — Telephone Encounter (Signed)
Left voicemail, pt needs to schedule 6 week follow up appt per checkout on 03/22/23.

## 2023-03-25 ENCOUNTER — Ambulatory Visit
Admission: RE | Admit: 2023-03-25 | Discharge: 2023-03-25 | Disposition: A | Discharge status: Home / Self Care | Payer: No Typology Code available for payment source | Source: Ambulatory Visit | Attending: Radiation Oncology | Admitting: Radiation Oncology

## 2023-03-25 ENCOUNTER — Encounter: Payer: Self-pay | Admitting: Radiation Oncology

## 2023-03-25 VITALS — BP 136/84 | HR 72 | Temp 97.6°F | Resp 16

## 2023-03-25 DIAGNOSIS — Z79899 Other long term (current) drug therapy: Secondary | ICD-10-CM | POA: Insufficient documentation

## 2023-03-25 DIAGNOSIS — C3412 Malignant neoplasm of upper lobe, left bronchus or lung: Secondary | ICD-10-CM | POA: Insufficient documentation

## 2023-03-25 DIAGNOSIS — Z7951 Long term (current) use of inhaled steroids: Secondary | ICD-10-CM | POA: Diagnosis not present

## 2023-03-25 DIAGNOSIS — Z85048 Personal history of other malignant neoplasm of rectum, rectosigmoid junction, and anus: Secondary | ICD-10-CM | POA: Insufficient documentation

## 2023-03-25 DIAGNOSIS — I252 Old myocardial infarction: Secondary | ICD-10-CM | POA: Insufficient documentation

## 2023-03-25 DIAGNOSIS — Z809 Family history of malignant neoplasm, unspecified: Secondary | ICD-10-CM | POA: Diagnosis not present

## 2023-03-25 DIAGNOSIS — J449 Chronic obstructive pulmonary disease, unspecified: Secondary | ICD-10-CM | POA: Diagnosis not present

## 2023-03-25 DIAGNOSIS — I272 Pulmonary hypertension, unspecified: Secondary | ICD-10-CM | POA: Diagnosis not present

## 2023-03-25 DIAGNOSIS — Z87891 Personal history of nicotine dependence: Secondary | ICD-10-CM | POA: Diagnosis not present

## 2023-03-25 DIAGNOSIS — Z7984 Long term (current) use of oral hypoglycemic drugs: Secondary | ICD-10-CM | POA: Diagnosis not present

## 2023-03-25 DIAGNOSIS — I219 Acute myocardial infarction, unspecified: Secondary | ICD-10-CM | POA: Diagnosis not present

## 2023-03-25 DIAGNOSIS — I4719 Other supraventricular tachycardia: Secondary | ICD-10-CM | POA: Diagnosis not present

## 2023-03-25 DIAGNOSIS — E871 Hypo-osmolality and hyponatremia: Secondary | ICD-10-CM | POA: Diagnosis not present

## 2023-03-25 NOTE — Consult Note (Signed)
NEW PATIENT EVALUATION  Name: Curtis Campos  MRN: [id number]  Date:   03/25/2023     DOB: October 22, 1940   This 82 y.o. 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  REFERRING PHYSICIAN: Danella Penton, MD  CHIEF COMPLAINT:  Chief Complaint  Patient presents with   Lung Cancer    DIAGNOSIS: The encounter diagnosis was Malignant neoplasm of upper lobe bronchus, left (HCC).   PREVIOUS INVESTIGATIONS:  PET/CT brain MRI and CT scans reviewed. Clinical notes reviewed Pathology reports reviewed  HPI: Patient is an 82 year old male with multiple comorbidities including pulmonary hypertension myocardial infarction atrial tachycardia chronic hyponatremia COPD who presented with a nodule of the left lung concerning for malignancy.  He underwent bronchoscopy which was positive for poorly differentiated carcinoma.  Lesion was hypermetabolic on PET CT scan.  Patient does have a remote history of rectal cancer.  There was no other evidence of metastatic disease within his hilar region mediastinum or any evidence of distant metastatic disease.  Patient is on constant nasal oxygen.  He is wheelchair-bound.  He specifically denies significant cough although has had some trace hemoptysis in the past.  Patient has been seen by medical oncology based on his multiple medical comorbidity he is not thought to be a surgical candidate.  Also use of concurrent chemotherapy with his medical comorbidities is not recommended by medical oncology.  Patient has remote history of rectal cancer CEA was performed and is 0.8  PLANNED TREATMENT REGIMEN: SBRT treatment  PAST MEDICAL HISTORY:  has a past medical history of Agent orange exposure, Anemia, Anxiety, Aortic atherosclerosis (HCC), Aortic stenosis (07/04/2013), Arthritis (03/12/2011), Cardiomegaly, Cerebral microvascular disease, Cholelithiasis, Chronic hyponatremia, Chronic obstructive lung  disease (HCC), Colostomy in place [facility]), Coronary artery disease (03/2013), Former smoker, HFrEF (heart failure with reduced ejection fraction) (HCC), History of bilateral cataract extraction, Hyperlipidemia, Hypertension, Hypokalemia, Ischemic cardiomyopathy, Long term (current) use of aspirin, Mass of upper lobe of left lung (03/10/2021), Multifocal atrial tachycardia (HCC), Myocardial infarction (HCC), Pneumonia, Pre-diabetes, Pulmonary hypertension (HCC), Rectal cancer (HCC), Requires continuous at home supplemental oxygen, S/P TAVR (transcatheter aortic valve replacement) (11/25/2021), Statin intolerance, and Umbilical hernia (s/p repair).    PAST SURGICAL HISTORY:  Past Surgical History:  Procedure Laterality Date   APPENDECTOMY  06/29/2003   CATARACT EXTRACTION, BILATERAL Bilateral    COLOSTOMY     HERNIA REPAIR     INTRAOPERATIVE TRANSTHORACIC ECHOCARDIOGRAM N/A 11/25/2021   Procedure: INTRAOPERATIVE TRANSTHORACIC ECHOCARDIOGRAM;  Surgeon: Kathleene Hazel, MD;  Location: [facility] OR;  Service: Open Heart Surgery;  Laterality: N/A;   resection of rectum     RIGHT/LEFT HEART CATH AND CORONARY ANGIOGRAPHY Bilateral 04/10/2013   Procedure: RIGHT/LEFT HEART CATH AND CORONARY ANGIOGRAPHY; Location: [facility]; Surgeon: Lorine Bears, MD   RIGHT/LEFT HEART CATH AND CORONARY ANGIOGRAPHY N/A 08/27/2021   Procedure: RIGHT/LEFT HEART CATH AND CORONARY ANGIOGRAPHY;  Surgeon: Tonny Bollman, MD;  Location: [facility] INVASIVE CV LAB;  Service: Cardiovascular;  Laterality: N/A;   TRANSCATHETER AORTIC VALVE REPLACEMENT, TRANSFEMORAL Left 11/25/2021   Procedure: Transcatheter Aortic Valve Replacement, Transfemoral using a 26 MM Edwards SAPIEN 3 Ultra.;  Surgeon: Kathleene Hazel, MD;  Location: [facility] OR;  Service: Open Heart Surgery;  Laterality: Left;   VIDEO BRONCHOSCOPY WITH ENDOBRONCHIAL ULTRASOUND N/A 03/03/2023   Procedure: VIDEO BRONCHOSCOPY WITH ENDOBRONCHIAL ULTRASOUND;  Surgeon: [name], MD;   Location: [facility] ORS;  Service: Thoracic;  Laterality: N/A;    FAMILY HISTORY: family  history includes Cancer - Cervical in his sister; Heart attack (age of onset: 22) in his brother; Heart disease in his father, mother, sister, and sister; Hyperlipidemia in his father; Hypertension in his father.  SOCIAL HISTORY:  reports that he quit smoking about 10 years ago. His smoking use included cigarettes. He started smoking about 60 years ago. He has a 50 pack-year smoking history. He has never used smokeless tobacco. He reports that he does not drink alcohol and does not use drugs.  ALLERGIES: Codeine and Statins  MEDICATIONS:  Current Outpatient Medications  Medication Sig Dispense Refill   albuterol (PROVENTIL) (2.5 MG/3ML) 0.083% nebulizer solution Take 2.5 mg by nebulization at bedtime.     albuterol (VENTOLIN HFA) 108 (90 Base) MCG/ACT inhaler INHALE 2 PUFFS BY MOUTH EVERY 6 HOURS AS NEEDED FOR SHORTNESS OF BREATH (Patient taking differently: 1-2 puffs every 6 (six) hours as needed. INHALE 2 PUFFS BY MOUTH EVERY 6 HOURS AS NEEDED FOR SHORTNESS OF BREATH) 18 g 0   Alirocumab (PRALUENT) 75 MG/ML SOAJ Inject 1 pen into the skin every 14 (fourteen) days. 2 pen 11   aspirin 81 MG tablet Take 81 mg by mouth at bedtime.     cholecalciferol (VITAMIN D3) 25 MCG (1000 UNIT) tablet Take 1,000 Units by mouth daily.     dapagliflozin propanediol (FARXIGA) 10 MG TABS tablet TAKE ONE TABLET BY MOUTH EVERY MORNING BEFORE BREAKFAST 30 tablet 6   diclofenac Sodium (VOLTAREN) 1 % GEL 2 g as needed.     ezetimibe (ZETIA) 10 MG tablet Take 1 tablet by mouth once daily for cholesterol. NEED APPOINTMENT FOR ANY MORE REFILLS (Patient taking differently: Take 10 mg by mouth at bedtime. Take 1 tablet by mouth once daily for cholesterol. NEED APPOINTMENT FOR ANY MORE REFILLS) 90 tablet 1   fluticasone-salmeterol (ADVAIR) 250-50 MCG/ACT AEPB Inhale 1 puff into the lungs in the morning and at bedtime.     Magnesium Oxide 400  (240 Mg) MG TABS TAKE 1 TABLET BY MOUTH ONCE DAILY. (Patient taking differently: Take 400 mg by mouth daily.) 90 tablet 3   OXYGEN Inhale 4 L into the lungs continuous.     sacubitril-valsartan (ENTRESTO) 49-51 MG Take half a tablet twice daily 90 tablet 1   sodium chloride 1 g tablet Take 1 g by mouth daily with lunch. 1/2 tab     torsemide (DEMADEX) 20 MG tablet Take 20 mg by mouth daily.     traZODone (DESYREL) 50 MG tablet Take 1 tablet (50 mg total) by mouth at bedtime as needed. for sleep 90 tablet 0   TRELEGY ELLIPTA 100-62.5-25 MCG/ACT AEPB Inhale 1 puff into the lungs as needed.     carvedilol (COREG) 6.25 MG tablet Take 6.25 mg by mouth 2 (two) times daily with a meal. (Patient not taking: Reported on 03/10/2023)     No current facility-administered medications for this encounter.    ECOG PERFORMANCE STATUS:  0 - Asymptomatic  REVIEW OF SYSTEMS: Patient denies any weight loss, fatigue, weakness, fever, chills or night sweats. Patient denies any loss of vision, blurred vision. Patient denies any ringing  of the ears or hearing loss. No irregular heartbeat. Patient denies heart murmur or history of fainting. Patient denies any chest pain or pain radiating to her upper extremities. Patient denies any shortness of breath, difficulty breathing at night, cough or hemoptysis. Patient denies any swelling in the lower legs. Patient denies any nausea vomiting, vomiting of blood, or coffee ground material in the  vomitus. Patient denies any stomach pain. Patient states has had normal bowel movements no significant constipation or diarrhea. Patient denies any dysuria, hematuria or significant nocturia. Patient denies any problems walking, swelling in the joints or loss of balance. Patient denies any skin changes, loss of hair or loss of weight. Patient denies any excessive worrying or anxiety or significant depression. Patient denies any problems with insomnia. Patient denies excessive thirst, polyuria,  polydipsia. Patient denies any swollen glands, patient denies easy bruising or easy bleeding. Patient denies any recent infections, allergies or URI. Patient "s visual fields have not changed significantly in recent time.   PHYSICAL EXAM: BP 136/84   Pulse 72   Temp 97.6 F (36.4 C) (Tympanic)   Resp 16  Frail-appearing wheelchair-bound male i on nasal oxygen.  Well-developed well-nourished patient in NAD. HEENT reveals PERLA, EOMI, discs not visualized.  Oral cavity is clear. No oral mucosal lesions are identified. Neck is clear without evidence of cervical or supraclavicular adenopathy. Lungs are clear to A&P. Cardiac examination is essentially unremarkable with regular rate and rhythm without murmur rub or thrill. Abdomen is benign with no organomegaly or masses noted. Motor sensory and DTR levels are equal and symmetric in the upper and lower extremities. Cranial nerves II through XII are grossly intact. Proprioception is intact. No peripheral adenopathy or edema is identified. No motor or sensory levels are noted. Crude visual fields are within normal range.  LABORATORY DATA: Pathology reports reviewed    RADIOLOGY RESULTS: MRI of brain was reviewed showing on my review since it has not been formally read no evidence of intracranial disease.  PET CT and CT scans also reviewed compatible with above-stated findings   IMPRESSION: Stage IIa poorly differentiated carcinoma of the left upper lobe in 82 year old male with multiple medical comorbidities  PLAN: At this time I believe I can treat this with 60 Gray in 5 fractions using SBRT treatment.  Risks and benefits of treatment including possible fatigue possible development of a cough about a month out from treatment all were discussed in detail with the patient and his daughter.  I have personally set up and ordered CT simulation after the new year holiday.  Should the lesion proved to be too large for SBRT treatment we we will consider  hypofractionated course of treatment over 10 fractions.  Patient and daughter both comprehend my treatment plan well.  I would like to take this opportunity to thank you for allowing me to participate in the care of your patient.Carmina Miller, MD

## 2023-03-29 ENCOUNTER — Telehealth: Payer: Self-pay | Admitting: Student

## 2023-03-29 ENCOUNTER — Inpatient Hospital Stay: Payer: No Typology Code available for payment source

## 2023-03-29 NOTE — Telephone Encounter (Signed)
Busy signal

## 2023-03-29 NOTE — Progress Notes (Signed)
[facility] CSW Progress Note  Visual merchandiser received a call from patient's wife, [name].  She stated that Dr. Rushie Chestnut had informed them of a decrease in radiation treatments.  [name] was pleased because she will have to minimize her relying on others for transportation.  Their church remains very involved.    Dorothey Baseman, LCSW Clinical Social Worker [facility]

## 2023-03-29 NOTE — Telephone Encounter (Signed)
Pt called in stating he was told to c/b with his bp after a week. He also stated he has been retaining fluid so he's been taking torsemide everyday.   12/24: 121/65  12/25: 126/57 12/26: 111/53 12/27: 145/76 12/28: 127/67

## 2023-03-30 ENCOUNTER — Other Ambulatory Visit: Payer: Self-pay

## 2023-03-30 DIAGNOSIS — I10 Essential (primary) hypertension: Secondary | ICD-10-CM

## 2023-03-30 DIAGNOSIS — I2510 Atherosclerotic heart disease of native coronary artery without angina pectoris: Secondary | ICD-10-CM

## 2023-03-30 DIAGNOSIS — I4719 Other supraventricular tachycardia: Secondary | ICD-10-CM

## 2023-03-30 DIAGNOSIS — I5022 Chronic systolic (congestive) heart failure: Secondary | ICD-10-CM

## 2023-03-30 NOTE — Telephone Encounter (Signed)
Patient is returning phone call.  °

## 2023-04-01 ENCOUNTER — Other Ambulatory Visit: Payer: Self-pay | Admitting: Radiation Oncology

## 2023-04-01 DIAGNOSIS — C801 Malignant (primary) neoplasm, unspecified: Secondary | ICD-10-CM

## 2023-04-05 ENCOUNTER — Other Ambulatory Visit: Payer: Self-pay

## 2023-04-05 ENCOUNTER — Inpatient Hospital Stay (HOSPITAL_COMMUNITY)
Admission: EM | Admit: 2023-04-05 | Discharge: 2023-05-01 | DRG: 097 | Disposition: E | Payer: No Typology Code available for payment source | Attending: Internal Medicine | Admitting: Internal Medicine

## 2023-04-05 ENCOUNTER — Encounter (HOSPITAL_COMMUNITY): Payer: Self-pay | Admitting: Emergency Medicine

## 2023-04-05 ENCOUNTER — Emergency Department (HOSPITAL_COMMUNITY): Payer: No Typology Code available for payment source

## 2023-04-05 DIAGNOSIS — G0481 Other encephalitis and encephalomyelitis: Secondary | ICD-10-CM | POA: Diagnosis not present

## 2023-04-05 DIAGNOSIS — Z9981 Dependence on supplemental oxygen: Secondary | ICD-10-CM

## 2023-04-05 DIAGNOSIS — I2510 Atherosclerotic heart disease of native coronary artery without angina pectoris: Secondary | ICD-10-CM

## 2023-04-05 DIAGNOSIS — R54 Age-related physical debility: Secondary | ICD-10-CM | POA: Diagnosis present

## 2023-04-05 DIAGNOSIS — J449 Chronic obstructive pulmonary disease, unspecified: Secondary | ICD-10-CM | POA: Diagnosis present

## 2023-04-05 DIAGNOSIS — Z923 Personal history of irradiation: Secondary | ICD-10-CM

## 2023-04-05 DIAGNOSIS — I110 Hypertensive heart disease with heart failure: Secondary | ICD-10-CM | POA: Diagnosis present

## 2023-04-05 DIAGNOSIS — Z885 Allergy status to narcotic agent status: Secondary | ICD-10-CM

## 2023-04-05 DIAGNOSIS — R627 Adult failure to thrive: Secondary | ICD-10-CM | POA: Diagnosis present

## 2023-04-05 DIAGNOSIS — J9622 Acute and chronic respiratory failure with hypercapnia: Secondary | ICD-10-CM | POA: Diagnosis present

## 2023-04-05 DIAGNOSIS — M1990 Unspecified osteoarthritis, unspecified site: Secondary | ICD-10-CM | POA: Diagnosis present

## 2023-04-05 DIAGNOSIS — Z8349 Family history of other endocrine, nutritional and metabolic diseases: Secondary | ICD-10-CM

## 2023-04-05 DIAGNOSIS — R7303 Prediabetes: Secondary | ICD-10-CM | POA: Diagnosis present

## 2023-04-05 DIAGNOSIS — Z7984 Long term (current) use of oral hypoglycemic drugs: Secondary | ICD-10-CM

## 2023-04-05 DIAGNOSIS — Z8249 Family history of ischemic heart disease and other diseases of the circulatory system: Secondary | ICD-10-CM

## 2023-04-05 DIAGNOSIS — R4182 Altered mental status, unspecified: Secondary | ICD-10-CM

## 2023-04-05 DIAGNOSIS — I5043 Acute on chronic combined systolic (congestive) and diastolic (congestive) heart failure: Secondary | ICD-10-CM | POA: Diagnosis not present

## 2023-04-05 DIAGNOSIS — I252 Old myocardial infarction: Secondary | ICD-10-CM

## 2023-04-05 DIAGNOSIS — I4891 Unspecified atrial fibrillation: Secondary | ICD-10-CM

## 2023-04-05 DIAGNOSIS — Z66 Do not resuscitate: Secondary | ICD-10-CM | POA: Diagnosis not present

## 2023-04-05 DIAGNOSIS — I255 Ischemic cardiomyopathy: Secondary | ICD-10-CM | POA: Diagnosis present

## 2023-04-05 DIAGNOSIS — Z87891 Personal history of nicotine dependence: Secondary | ICD-10-CM

## 2023-04-05 DIAGNOSIS — Z888 Allergy status to other drugs, medicaments and biological substances status: Secondary | ICD-10-CM

## 2023-04-05 DIAGNOSIS — I4819 Other persistent atrial fibrillation: Secondary | ICD-10-CM

## 2023-04-05 DIAGNOSIS — R569 Unspecified convulsions: Principal | ICD-10-CM

## 2023-04-05 DIAGNOSIS — E876 Hypokalemia: Secondary | ICD-10-CM | POA: Diagnosis present

## 2023-04-05 DIAGNOSIS — J690 Pneumonitis due to inhalation of food and vomit: Secondary | ICD-10-CM | POA: Diagnosis not present

## 2023-04-05 DIAGNOSIS — I700 Atherosclerosis of aorta: Secondary | ICD-10-CM | POA: Diagnosis present

## 2023-04-05 DIAGNOSIS — Z9221 Personal history of antineoplastic chemotherapy: Secondary | ICD-10-CM

## 2023-04-05 DIAGNOSIS — I5022 Chronic systolic (congestive) heart failure: Secondary | ICD-10-CM

## 2023-04-05 DIAGNOSIS — Z952 Presence of prosthetic heart valve: Secondary | ICD-10-CM

## 2023-04-05 DIAGNOSIS — Z7901 Long term (current) use of anticoagulants: Secondary | ICD-10-CM

## 2023-04-05 DIAGNOSIS — C3412 Malignant neoplasm of upper lobe, left bronchus or lung: Secondary | ICD-10-CM | POA: Diagnosis present

## 2023-04-05 DIAGNOSIS — Z85048 Personal history of other malignant neoplasm of rectum, rectosigmoid junction, and anus: Secondary | ICD-10-CM

## 2023-04-05 DIAGNOSIS — Z953 Presence of xenogenic heart valve: Secondary | ICD-10-CM

## 2023-04-05 DIAGNOSIS — Z7982 Long term (current) use of aspirin: Secondary | ICD-10-CM

## 2023-04-05 DIAGNOSIS — Z7951 Long term (current) use of inhaled steroids: Secondary | ICD-10-CM

## 2023-04-05 DIAGNOSIS — Z79899 Other long term (current) drug therapy: Secondary | ICD-10-CM

## 2023-04-05 DIAGNOSIS — Z515 Encounter for palliative care: Secondary | ICD-10-CM

## 2023-04-05 DIAGNOSIS — G40909 Epilepsy, unspecified, not intractable, without status epilepticus: Secondary | ICD-10-CM | POA: Diagnosis present

## 2023-04-05 DIAGNOSIS — Z9289 Personal history of other medical treatment: Secondary | ICD-10-CM

## 2023-04-05 DIAGNOSIS — J9621 Acute and chronic respiratory failure with hypoxia: Secondary | ICD-10-CM | POA: Diagnosis present

## 2023-04-05 DIAGNOSIS — E782 Mixed hyperlipidemia: Secondary | ICD-10-CM | POA: Diagnosis present

## 2023-04-05 LAB — URINALYSIS, ROUTINE W REFLEX MICROSCOPIC
Bacteria, UA: NONE SEEN
Bilirubin Urine: NEGATIVE
Glucose, UA: >=500 mg/dL — AB
Hgb urine dipstick: NEGATIVE
Ketones, ur: NEGATIVE mg/dL
Leukocytes,Ua: NEGATIVE
Nitrite: NEGATIVE
Protein, ur: NEGATIVE mg/dL
Specific Gravity, Urine: 1.01 (ref 1.005–1.030)
pH: 5 (ref 5.0–8.0)

## 2023-04-05 LAB — COMPREHENSIVE METABOLIC PANEL
ALT: 12 U/L (ref 0–44)
AST: 13 U/L — ABNORMAL LOW (ref 15–41)
Albumin: 2.6 g/dL — ABNORMAL LOW (ref 3.5–5.0)
Alkaline Phosphatase: 127 U/L — ABNORMAL HIGH (ref 38–126)
Anion gap: 11 (ref 5–15)
BUN: 14 mg/dL (ref 8–23)
CO2: 27 mmol/L (ref 22–32)
Calcium: 8.7 mg/dL — ABNORMAL LOW (ref 8.9–10.3)
Chloride: 93 mmol/L — ABNORMAL LOW (ref 98–111)
Creatinine, Ser: 0.77 mg/dL (ref 0.61–1.24)
GFR, Estimated: >60 mL/min (ref >60)
Glucose, Bld: 166 mg/dL — ABNORMAL HIGH (ref 70–99)
Potassium: 3.4 mmol/L — ABNORMAL LOW (ref 3.5–5.1)
Sodium: 131 mmol/L — ABNORMAL LOW (ref 135–145)
Total Bilirubin: 0.6 mg/dL (ref 0.0–1.2)
Total Protein: 7.5 g/dL (ref 6.5–8.1)

## 2023-04-05 LAB — CBC
HCT: 29 % — ABNORMAL LOW (ref 39.0–52.0)
Hemoglobin: 8.8 g/dL — ABNORMAL LOW (ref 13.0–17.0)
MCH: 26.2 pg (ref 26.0–34.0)
MCHC: 30.3 g/dL (ref 30.0–36.0)
MCV: 86.3 fL (ref 80.0–100.0)
Platelets: 574 10*3/uL — ABNORMAL HIGH (ref 150–400)
RBC: 3.36 MIL/uL — ABNORMAL LOW (ref 4.22–5.81)
RDW: 15 % (ref 11.5–15.5)
WBC: 18.6 10*3/uL — ABNORMAL HIGH (ref 4.0–10.5)
nRBC: 0 % (ref 0.0–0.2)

## 2023-04-05 LAB — MAGNESIUM: Magnesium: 2.1 mg/dL (ref 1.7–2.4)

## 2023-04-05 LAB — PROTIME-INR
INR: 1.2 (ref 0.8–1.2)
Prothrombin Time: 15.4 s — ABNORMAL HIGH (ref 11.4–15.2)

## 2023-04-05 LAB — RESP PANEL BY RT-PCR (RSV, FLU A&B, COVID)  RVPGX2
Influenza A by PCR: NEGATIVE
Influenza B by PCR: NEGATIVE
Resp Syncytial Virus by PCR: NEGATIVE
SARS Coronavirus 2 by RT PCR: NEGATIVE

## 2023-04-05 LAB — AMMONIA: Ammonia: 16 umol/L (ref 9–35)

## 2023-04-05 LAB — BRAIN NATRIURETIC PEPTIDE: B Natriuretic Peptide: 233 pg/mL — ABNORMAL HIGH (ref 0.0–100.0)

## 2023-04-05 LAB — TSH: TSH: 3.799 u[IU]/mL (ref 0.350–4.500)

## 2023-04-05 MED ORDER — LEVETIRACETAM IN NACL 1500 MG/100ML IV SOLN
1500.0000 mg | Freq: Once | INTRAVENOUS | Status: AC
  Administered 2023-04-05: 1500 mg via INTRAVENOUS
  Filled 2023-04-05: qty 100

## 2023-04-05 MED ORDER — LORAZEPAM 2 MG/ML IJ SOLN
2.0000 mg | Freq: Once | INTRAMUSCULAR | Status: AC
  Administered 2023-04-05: 2 mg via INTRAVENOUS

## 2023-04-05 MED ORDER — SODIUM CHLORIDE 0.9 % IV SOLN: 20.0000 mg/kg | Freq: Once | INTRAVENOUS | Status: DC

## 2023-04-05 MED ORDER — LORAZEPAM 2 MG/ML IJ SOLN
INTRAMUSCULAR | Status: AC
  Filled 2023-04-05: qty 1

## 2023-04-05 NOTE — ED Notes (Signed)
 Patient began to have involuntary movements with posturing while presenting with a gaze to the left. Patient O2 sats decreased. Patient placed on non-rebreather.

## 2023-04-05 NOTE — ED Provider Notes (Addendum)
  EMERGENCY DEPARTMENT AT [facility] Provider Note  CSN: [id number] Arrival date & time: 04/05/23 1831  Chief Complaint(s) Altered Mental Status  HPI RIVER Curtis Campos is a 83 y.o. male history of aortic stenosis status post TAVR, rectal cancer status post colostomy, coronary artery disease, CHF, hypertension, hyperlipidemia, lung mass, on chronic home oxygen  presenting to the hospital with altered mental status.  Patient brought in by family, they report that today he seemed a little off, they plan to bring him into the hospital for some mild confusion.  On the way, patient had episode of stiffening up, altered mental status and unresponsiveness.  On EMS arrival the patient was improved, on ER arrival patient reports he does not have any complaints, mild headache, does not remember this episode earlier.  No recent fevers or chills, chest pain, abdominal pain, urinary symptoms, back pain, numbness or tingling, weakness.    Past Medical History Past Medical History:  Diagnosis Date   Agent orange exposure    Anemia    Anxiety    Aortic atherosclerosis (HCC)    Aortic stenosis 07/04/2013   a.)TTE 07/04/13: mild (MPG 12; AVA 1.6); b.) TTE 01/01/16: mod (MPG 14; AVA 1.17); c.) TTE 08/30/17: mod (MPG 15; AVA 1.25); d.) TTE 07/13/19: mod (MPG 19; AVA 1.16); e.) TTE 07/23/20: mod (MPG 22; AVA 1.25); f.) TTE 03/12/2021: mod-sev (MPH 34; AVA 0.75); g.) TTE 05/21/2021: mod (MPG 22; AVA 0.8); h.) RHC 08/27/2020: sev (MPG 26.18; AVA 0.69); i.) s/p TAVR 11/25/21: 26 mm Edwards S3 Ultra Resilia   Arthritis 03/12/2011   Cardiomegaly    Cerebral microvascular disease    Cholelithiasis    Chronic hyponatremia    Chronic obstructive lung disease (HCC)    Colostomy in place [facility])    a.) s/p resection secondary to rectal cancer   Coronary artery disease 03/2013   a.) LHC 04/10/2013: 10% mLAD, 50% D1, 30% D2, 99% mLCx, 50% OM1, 40% pRCA, 30% mRCA, 60% RPDA, 40% RPLS - med mgmt; b.) MV  08/31/2019: basal inflat/mid inflat defect c/w ischemia in LCx distribution. EF 37%; c.) R/LHC 08/27/2020: 50% D1, 50% D2, 90% mLCx, 40% p-mRCA, 80% RPDA - med mgmt   Former smoker    HFrEF (heart failure with reduced ejection fraction) (HCC)    a.) TEE 04/13/13: EF 20-25, glob HK, G1DD, mild LAE, mod-sev MR; b.) TTE 08/30/17: EF 45-50, LVH, mild LAE, G1DD, mod AS, mod MR; c.) TTE 07/13/19: EF 30-35, glob HK, G1DD, RVSP 49.4, mild LAE, mild-mod MR, mod AS; d.) TTE 03/12/21: EF 55, G2DD, mild MR, mod-sev AS; e.) TTE 12/24/21: EF 50, mild LVH, G1DD, RVSP 40.5, mild-mod MR, triv AR; f.) TTE 11/18/22: EF 45-50, glob HK, G1DD, mild MR   History of bilateral cataract extraction    Hyperlipidemia    Hypertension    Hypokalemia    Ischemic cardiomyopathy    a.) TTE 04/08/2014: EF 25-30%; b.) TEE 04/13/2013: EF 20-25%; c.) TTE 07/04/2013: EF 30-35%; d.) TTE 01/01/2016: EF 40-45%; e.) TTE 08/30/2017: EF 45-50%; f.) TTE 07/03/2019: EF 30-35%; g.) MV 08/31/2019: EF 37%; h.) TTE 07/23/20: EF 55-60%; i.) TTE 03/12/2021: EF 55%; j.) TTE 05/21/2021: EF 40-45%; k.) TTE 11/26/2021: EF 45-50%; l.) TTE 12/24/2021: EF 50%; m.) TTE 11/18/2022: EF 45-50%   Long term (current) use of aspirin     Mass of upper lobe of left lung 03/10/2021   a.) CTA chest 03/10/2021: 6.1 mm; b.) CTA chest 10/15/2021: 8 mm; c.) PET CT 11/14/2021: 8 mm (  SUV max 2.8); d.) CXR 10/05/2022: interval increase to 2.7 cm; e.) CXR 01/18/2023: interval increase to 3.7 cm; f.) CT chest 01/22/2023: 3.8 x 3.2 cm; g.) PET CT 02/01/2023: 3.8 cm and markedly hypermetabolic with SUV max of 43   Multifocal atrial tachycardia (HCC)    Myocardial infarction (HCC)    a.) suspected by cardiology during 03/2013 admission for CP/CAP/new onset CHF --> TTE  showed severe LV dysfunction with segmental wall motion abnormalities and severe MR (posteriorly-directed  jet)   Pneumonia    Pre-diabetes    Pulmonary hypertension (HCC)    a.) TTE 04/08/2013: RVSP 62.2; b.) TTE  07/13/2019: RVSP 49.4; c.) TTE 07/23/2020: RVSP 33.2; d.) TTE 11/26/2021: RVSP 41.2; e.) TTE 12/24/2021: RVSP 40.5; f.) TTE 11/18/2019: RVSP 29.8   Rectal cancer (HCC)    Requires continuous at home supplemental oxygen     a.) 4L/[place]   S/P TAVR (transcatheter aortic valve replacement) 11/25/2021   a.) 26mm Edwards Sapien 3 Ultra Resilia via the TF approach   Statin intolerance    Umbilical hernia (s/p repair)    Patient Active Problem List   Diagnosis Date Noted   Adenocarcinoma of left lung (HCC) 03/10/2023   S/P TAVR (transcatheter aortic valve replacement) 11/25/2021   Aortic atherosclerosis (HCC) 05/15/2021   Severe aortic stenosis    Actinic otitis externa 04/17/2019   Cerumen impaction 04/03/2019   Acute exacerbation of chronic obstructive pulmonary disease (COPD) (HCC) 06/14/2018   Prediabetes 09/10/2017   Chronic hyponatremia 06/23/2016   Mitral regurgitation    Chronic obstructive pulmonary disease (HCC) 04/21/2013   Acute respiratory failure with hypoxia (HCC) 04/21/2013   Coronary artery disease 03/30/2013   Chronic systolic heart failure (HCC) 03/30/2013   Insomnia 03/29/2013   Multifocal atrial tachycardia (HCC) 01/17/2013   Osteoarthritis of knees, bilateral 03/12/2011   Essential hypertension 03/06/2008   Nicotine dependence 03/10/2007   HLD (hyperlipidemia) 03/18/2006   Malignant neoplasm of rectum (HCC) 03/31/2003   Home Medication(s) Prior to Admission medications   Medication Sig Start Date End Date Taking? Authorizing Provider  acetaminophen  (TYLENOL ) 500 MG tablet Take 500 mg by mouth at bedtime.   Yes [provider]  albuterol  (PROVENTIL ) (2.5 MG/3ML) 0.083% nebulizer solution Take 2.5 mg by nebulization at bedtime. 10/13/21  Yes [provider]  albuterol  (VENTOLIN  HFA) 108 (90 Base) MCG/ACT inhaler INHALE 2 PUFFS BY MOUTH EVERY 6 HOURS AS NEEDED FOR SHORTNESS OF BREATH Patient taking differently: 1-2 puffs every 6 (six) hours as needed.  INHALE 2 PUFFS BY MOUTH EVERY 6 HOURS AS NEEDED FOR SHORTNESS OF BREATH 08/29/18  Yes Gretta Comer POUR, NP  Alirocumab  (PRALUENT ) 75 MG/ML SOAJ Inject 1 pen into the skin every 14 (fourteen) days. 08/25/18  Yes Darron Deatrice LABOR, MD  aspirin  81 MG tablet Take 81 mg by mouth at bedtime.   Yes [provider]  carvedilol  (COREG ) 6.25 MG tablet Take 3.125 mg by mouth 2 (two) times daily with a meal.   Yes [provider]  cholecalciferol  (VITAMIN D3) 25 MCG (1000 UNIT) tablet Take 1,000 Units by mouth daily.   Yes [provider]  dapagliflozin  propanediol (FARXIGA ) 10 MG TABS tablet TAKE ONE TABLET BY MOUTH EVERY MORNING BEFORE BREAKFAST 02/24/23  Yes Darron Deatrice LABOR, MD  diclofenac Sodium (VOLTAREN) 1 % GEL 2 g as needed. 04/08/21  Yes [provider]  fluticasone -salmeterol (ADVAIR) 250-50 MCG/ACT AEPB Inhale 1 puff into the lungs in the morning and at bedtime. 08/31/22  Yes [provider]  Magnesium  Oxide 400 (240 Mg) MG TABS TAKE 1 TABLET BY MOUTH ONCE DAILY. Patient taking differently: Take 400 mg by mouth daily. 05/03/17  Yes Darron Deatrice LABOR, MD  OXYGEN  Inhale 4 L into the lungs continuous.   Yes [provider]  sacubitril -valsartan  (ENTRESTO ) 49-51 MG Take half a tablet twice daily 02/23/23  Yes Darron Deatrice LABOR, MD  sodium chloride  1 g tablet Take 1 g by mouth daily with lunch. 1/2 tab 01/20/23 01/20/24 Yes [provider]  torsemide  (DEMADEX ) 20 MG tablet Take 20 mg by mouth daily.   Yes [provider]  traZODone  (DESYREL ) 50 MG tablet Take 1 tablet (50 mg total) by mouth at bedtime as needed. for sleep 08/29/18  Yes Gretta Comer POUR, NP                                                                                                                                    Past Surgical History Past Surgical History:  Procedure Laterality Date   APPENDECTOMY  06/29/2003   CATARACT EXTRACTION, BILATERAL Bilateral     COLOSTOMY     HERNIA REPAIR     INTRAOPERATIVE TRANSTHORACIC ECHOCARDIOGRAM N/A 11/25/2021   Procedure: INTRAOPERATIVE TRANSTHORACIC ECHOCARDIOGRAM;  Surgeon: Verlin Lonni BIRCH, MD;  Location: [facility] OR;  Service: Open Heart Surgery;  Laterality: N/A;   resection of rectum     RIGHT/LEFT HEART CATH AND CORONARY ANGIOGRAPHY Bilateral 04/10/2013   Procedure: RIGHT/LEFT HEART CATH AND CORONARY ANGIOGRAPHY; Location: [facility]; Surgeon: Deatrice Darron, MD   RIGHT/LEFT HEART CATH AND CORONARY ANGIOGRAPHY N/A 08/27/2021   Procedure: RIGHT/LEFT HEART CATH AND CORONARY ANGIOGRAPHY;  Surgeon: Wonda Sharper, MD;  Location: [facility] INVASIVE CV LAB;  Service: Cardiovascular;  Laterality: N/A;   TRANSCATHETER AORTIC VALVE REPLACEMENT, TRANSFEMORAL Left 11/25/2021   Procedure: Transcatheter Aortic Valve Replacement, Transfemoral using a 26 MM Edwards SAPIEN 3 Ultra.;  Surgeon: Verlin Lonni BIRCH, MD;  Location: [facility] OR;  Service: Open Heart Surgery;  Laterality: Left;   VIDEO BRONCHOSCOPY WITH ENDOBRONCHIAL ULTRASOUND N/A 03/03/2023   Procedure: VIDEO BRONCHOSCOPY WITH ENDOBRONCHIAL ULTRASOUND;  Surgeon: Parris Manna, MD;  Location: [facility] ORS;  Service: Thoracic;  Laterality: N/A;   Family History Family History  Problem Relation Age of Onset   Heart disease Mother    Heart disease Father    Hyperlipidemia Father    Hypertension Father    Heart disease Sister        stents placed    Cancer - Cervical Sister    Heart disease Sister        CABG   Heart attack Brother 49       MI    Social History Social History   Tobacco Use   Smoking status: Former    Current packs/day: 0.00    Average packs/day: 1 pack/day for 50.0 years (50.0 ttl pk-yrs)    Types: Cigarettes    Start date: 03/26/1963  Quit date: 03/25/2013    Years since quitting: 10.0   Smokeless tobacco: Never  Vaping Use   Vaping status: Never Used  Substance Use Topics   Alcohol  use: No    Alcohol /week: 0.0 standard drinks of  alcohol    Drug use: No   Allergies Codeine and Statins  Review of Systems Review of Systems  All other systems reviewed and are negative.   Physical Exam Vital Signs  I have reviewed the triage vital signs BP (!) 117/53   Pulse 88   Temp 97.7 F (36.5 C) (Axillary)   Resp 17   Wt 75.3 kg   SpO2 91%   BMI 26.81 kg/m  Physical Exam Vitals and nursing note reviewed.  Constitutional:      General: He is not in acute distress.    Appearance: Normal appearance.  HENT:     Mouth/Throat:     Mouth: Mucous membranes are moist.  Eyes:     Conjunctiva/sclera: Conjunctivae normal.  Cardiovascular:     Rate and Rhythm: Normal rate and regular rhythm.  Pulmonary:     Effort: Pulmonary effort is normal. No respiratory distress.     Breath sounds: Normal breath sounds.  Abdominal:     General: Abdomen is flat.     Palpations: Abdomen is soft.     Tenderness: There is no abdominal tenderness.  Musculoskeletal:     Right lower leg: No edema.     Left lower leg: No edema.  Skin:    General: Skin is warm and dry.     Capillary Refill: Capillary refill takes less than 2 seconds.  Neurological:     Mental Status: He is alert and oriented to person, place, and time. Mental status is at baseline.     Comments: Cranial nerves II through XII intact, strength 5 out of 5 in the bilateral upper and lower extremities  Psychiatric:        Mood and Affect: Mood normal.        Behavior: Behavior normal.     ED Results and Treatments Labs (all labs ordered are listed, but only abnormal results are displayed) Labs Reviewed  COMPREHENSIVE METABOLIC PANEL - Abnormal; Notable for the following components:      Result Value   Sodium 131 (*)    Potassium 3.4 (*)    Chloride 93 (*)    Glucose, Bld 166 (*)    Calcium 8.7 (*)    Albumin 2.6 (*)    AST 13 (*)    Alkaline Phosphatase 127 (*)    All other components within normal limits  CBC - Abnormal; Notable for the following components:    WBC 18.6 (*)    RBC 3.36 (*)    Hemoglobin 8.8 (*)    HCT 29.0 (*)    Platelets 574 (*)    All other components within normal limits  URINALYSIS, ROUTINE W REFLEX MICROSCOPIC - Abnormal; Notable for the following components:   Glucose, UA >=500 (*)    All other components within normal limits  PROTIME-INR - Abnormal; Notable for the following components:   Prothrombin Time 15.4 (*)    All other components within normal limits  BRAIN NATRIURETIC PEPTIDE - Abnormal; Notable for the following components:   B Natriuretic Peptide 233.0 (*)    All other components within normal limits  RESP PANEL BY RT-PCR (RSV, FLU A&B, COVID)  RVPGX2  MAGNESIUM   AMMONIA  TSH  Radiology DG Chest Port 1 View Result Date: 04/05/2023 CLINICAL DATA:  Chest pain EXAM: PORTABLE CHEST 1 VIEW COMPARISON:  03/06/2023 FINDINGS: Again seen is the left upper lobe mass compatible with known primary lung cancer. Patchy opacities in the right upper lobe are unchanged. This likely reflects scarring as seen on prior chest CT. No acute confluent opacities or effusions. Heart is normal size. No acute bony abnormality. IMPRESSION: Left upper lobe mass again noted as seen on prior imaging compatible with known lung cancer. No acute cardiopulmonary disease. Electronically Signed   By: Franky Crease M.D.   On: 04/05/2023 20:21   CT Head Wo Contrast Result Date: 04/05/2023 CLINICAL DATA:  Headache EXAM: CT HEAD WITHOUT CONTRAST TECHNIQUE: Contiguous axial images were obtained from the base of the skull through the vertex without intravenous contrast. RADIATION DOSE REDUCTION: This exam was performed according to the departmental dose-optimization program which includes automated exposure control, adjustment of the mA and/or kV according to patient size and/or use of iterative reconstruction technique. COMPARISON:   Head CT 01/18/2023.  MRI brain 01/14/2023. FINDINGS: Brain: No evidence of acute infarction, hemorrhage, hydrocephalus, extra-axial collection or mass lesion/mass effect. Again seen is mild diffuse atrophy. There is extensive periventricular and deep white matter hypodensity which is similar to the prior examination. Vascular: Atherosclerotic calcifications are present within the cavernous internal carotid arteries. Skull: Normal. Negative for fracture or focal lesion. Sinuses/Orbits: No acute finding. Other: None. IMPRESSION: 1. No acute intracranial process. 2. Stable atrophy and chronic small vessel ischemic changes. Electronically Signed   By: Greig Pique M.D.   On: 04/05/2023 20:14    Pertinent labs & imaging results that were available during my care of the patient were reviewed by me and considered in my medical decision making (see MDM for details).  Medications Ordered in ED Medications  LORazepam  (ATIVAN ) injection 2 mg (2 mg Intravenous Given 04/05/23 2147)  levETIRAcetam  (KEPPRA ) IVPB 1500 mg/ 100 mL premix (0 mg Intravenous Stopped 04/05/23 2229)                                                                                                                                     Procedures .Critical Care  Performed by: Francesca Elsie CROME, MD Authorized by: Francesca Elsie CROME, MD   Critical care provider statement:    Critical care time (minutes):  30   Critical care was necessary to treat or prevent imminent or life-threatening deterioration of the following conditions:  CNS failure or compromise   Critical care was time spent personally by me on the following activities:  Development of treatment plan with patient or surrogate, discussions with consultants, evaluation of patient's response to treatment, examination of patient, ordering and review of laboratory studies, ordering and review of radiographic studies, ordering and performing treatments and interventions, pulse oximetry,  re-evaluation of patient's condition and review of old charts   (including critical care time)  Medical  Decision Making / ED Course   MDM:  83 year old presenting to the emergency department with episode of stiffening and altered mental status.  On arrival to emergency department, patient A&O x 3, follow commands, overall well-appearing, on chronic home oxygen .  No focal deficit.  All patient being evaluated for earlier episode, was noted by staff to have episode of leftward gaze deviation, lasting around 30 seconds.  Afterwards was able to state his name but seemed confused.  Subsequent to this, was witnessed also have a further episode of tonic-clonic activity with persistent left gaze deviation which was terminated by Ativan .  Suspicious for seizure.  No ongoing gaze deviation.  He remains somnolent but has improved.  Patient loaded with Keppra .  Seems consistent with new onset seizure.  Patient has no history of seizures.  Differential includes intracranial process such as brain or metastatic disease not visible on CT, toxic or metabolic cause, epilepsy, infectious process.  Will likely need MRI brain to further evaluate.  Discussed with neurology Dr. Jerrie, recommends that we observe the patient, if he returns to baseline, can be admitted to [facility] for further evaluation.  If remains altered, recommends transfer to [facility].  She request that we discussed with her on reevaluation.  Signed out to oncoming physician Dr. Raford pending reassessment.      Additional history obtained: -Additional history obtained from family and ems -External records from outside source obtained and reviewed including: Chart review including previous notes, labs, imaging, consultation notes including    Lab Tests: -I ordered, reviewed, and interpreted labs.   The pertinent results include:   Labs Reviewed  COMPREHENSIVE METABOLIC PANEL - Abnormal; Notable for the following components:      Result Value    Sodium 131 (*)    Potassium 3.4 (*)    Chloride 93 (*)    Glucose, Bld 166 (*)    Calcium 8.7 (*)    Albumin 2.6 (*)    AST 13 (*)    Alkaline Phosphatase 127 (*)    All other components within normal limits  CBC - Abnormal; Notable for the following components:   WBC 18.6 (*)    RBC 3.36 (*)    Hemoglobin 8.8 (*)    HCT 29.0 (*)    Platelets 574 (*)    All other components within normal limits  URINALYSIS, ROUTINE W REFLEX MICROSCOPIC - Abnormal; Notable for the following components:   Glucose, UA >=500 (*)    All other components within normal limits  PROTIME-INR - Abnormal; Notable for the following components:   Prothrombin Time 15.4 (*)    All other components within normal limits  BRAIN NATRIURETIC PEPTIDE - Abnormal; Notable for the following components:   B Natriuretic Peptide 233.0 (*)    All other components within normal limits  RESP PANEL BY RT-PCR (RSV, FLU A&B, COVID)  RVPGX2  MAGNESIUM   AMMONIA  TSH    Notable for leukocytosis, likely reactive, no hyponatremia   EKG   EKG Interpretation Date/Time:  Monday April 05 2023 19:27:05 EST Ventricular Rate:  92 PR Interval:  164 QRS Duration:  76 QT Interval:  344 QTC Calculation: 425 R Axis:   -7  Text Interpretation: Sinus rhythm with Premature supraventricular complexes Anteroseptal infarct (cited on or before 05-Oct-2022) Abnormal ECG Confirmed by Francesca Fallow ([id number]) on 04/05/2023 8:05:13 PM         Imaging Studies ordered: I ordered imaging studies including CT head On my interpretation imaging demonstrates no acute process  I independently visualized and interpreted imaging. I agree with the radiologist interpretation   Medicines ordered and prescription drug management: Meds ordered this encounter  Medications   DISCONTD: levETIRAcetam  (KEPPRA ) 20 mg/kg in sodium chloride  0.9 % 100 mL IVPB   LORazepam  (ATIVAN ) injection 2 mg   LORazepam  (ATIVAN ) 2 MG/ML injection    Rush, Christian  A: cabinet override   levETIRAcetam  (KEPPRA ) IVPB 1500 mg/ 100 mL premix    -I have reviewed the patients home medicines and have made adjustments as needed   Consultations Obtained: I requested consultation with the neurology,  and discussed lab and imaging findings as well as pertinent plan - they recommend: observe, if back to baseline admit here, if persistent somnolence send to [place]   Social Determinants of Health:  Diagnosis or treatment significantly limited by social determinants of health: former smoker   Reevaluation: After the interventions noted above, I reevaluated the patient and found that their symptoms have improved  Co morbidities that complicate the patient evaluation  Past Medical History:  Diagnosis Date   Agent orange exposure    Anemia    Anxiety    Aortic atherosclerosis (HCC)    Aortic stenosis 07/04/2013   a.)TTE 07/04/13: mild (MPG 12; AVA 1.6); b.) TTE 01/01/16: mod (MPG 14; AVA 1.17); c.) TTE 08/30/17: mod (MPG 15; AVA 1.25); d.) TTE 07/13/19: mod (MPG 19; AVA 1.16); e.) TTE 07/23/20: mod (MPG 22; AVA 1.25); f.) TTE 03/12/2021: mod-sev (MPH 34; AVA 0.75); g.) TTE 05/21/2021: mod (MPG 22; AVA 0.8); h.) RHC 08/27/2020: sev (MPG 26.18; AVA 0.69); i.) s/p TAVR 11/25/21: 26 mm Edwards S3 Ultra Resilia   Arthritis 03/12/2011   Cardiomegaly    Cerebral microvascular disease    Cholelithiasis    Chronic hyponatremia    Chronic obstructive lung disease (HCC)    Colostomy in place [facility])    a.) s/p resection secondary to rectal cancer   Coronary artery disease 03/2013   a.) LHC 04/10/2013: 10% mLAD, 50% D1, 30% D2, 99% mLCx, 50% OM1, 40% pRCA, 30% mRCA, 60% RPDA, 40% RPLS - med mgmt; b.) MV 08/31/2019: basal inflat/mid inflat defect c/w ischemia in LCx distribution. EF 37%; c.) R/LHC 08/27/2020: 50% D1, 50% D2, 90% mLCx, 40% p-mRCA, 80% RPDA - med mgmt   Former smoker    HFrEF (heart failure with reduced ejection fraction) (HCC)    a.) TEE 04/13/13: EF  20-25, glob HK, G1DD, mild LAE, mod-sev MR; b.) TTE 08/30/17: EF 45-50, LVH, mild LAE, G1DD, mod AS, mod MR; c.) TTE 07/13/19: EF 30-35, glob HK, G1DD, RVSP 49.4, mild LAE, mild-mod MR, mod AS; d.) TTE 03/12/21: EF 55, G2DD, mild MR, mod-sev AS; e.) TTE 12/24/21: EF 50, mild LVH, G1DD, RVSP 40.5, mild-mod MR, triv AR; f.) TTE 11/18/22: EF 45-50, glob HK, G1DD, mild MR   History of bilateral cataract extraction    Hyperlipidemia    Hypertension    Hypokalemia    Ischemic cardiomyopathy    a.) TTE 04/08/2014: EF 25-30%; b.) TEE 04/13/2013: EF 20-25%; c.) TTE 07/04/2013: EF 30-35%; d.) TTE 01/01/2016: EF 40-45%; e.) TTE 08/30/2017: EF 45-50%; f.) TTE 07/03/2019: EF 30-35%; g.) MV 08/31/2019: EF 37%; h.) TTE 07/23/20: EF 55-60%; i.) TTE 03/12/2021: EF 55%; j.) TTE 05/21/2021: EF 40-45%; k.) TTE 11/26/2021: EF 45-50%; l.) TTE 12/24/2021: EF 50%; m.) TTE 11/18/2022: EF 45-50%   Long term (current) use of aspirin     Mass of upper lobe of left lung 03/10/2021   a.) CTA  chest 03/10/2021: 6.1 mm; b.) CTA chest 10/15/2021: 8 mm; c.) PET CT 11/14/2021: 8 mm (SUV max 2.8); d.) CXR 10/05/2022: interval increase to 2.7 cm; e.) CXR 01/18/2023: interval increase to 3.7 cm; f.) CT chest 01/22/2023: 3.8 x 3.2 cm; g.) PET CT 02/01/2023: 3.8 cm and markedly hypermetabolic with SUV max of 43   Multifocal atrial tachycardia (HCC)    Myocardial infarction [facility])    a.) suspected by cardiology during 03/2013 admission for CP/CAP/new onset CHF --> TTE  showed severe LV dysfunction with segmental wall motion abnormalities and severe MR (posteriorly-directed  jet)   Pneumonia    Pre-diabetes    Pulmonary hypertension (HCC)    a.) TTE 04/08/2013: RVSP 62.2; b.) TTE 07/13/2019: RVSP 49.4; c.) TTE 07/23/2020: RVSP 33.2; d.) TTE 11/26/2021: RVSP 41.2; e.) TTE 12/24/2021: RVSP 40.5; f.) TTE 11/18/2019: RVSP 29.8   Rectal cancer (HCC)    Requires continuous at home supplemental oxygen     a.) 4L/[place]   S/P TAVR (transcatheter aortic valve  replacement) 11/25/2021   a.) 26mm Edwards Sapien 3 Ultra Resilia via the TF approach   Statin intolerance    Umbilical hernia (s/p repair)       Dispostion: Disposition decision including need for hospitalization was considered, and patient disposition pending at time of sign out.    Final Clinical Impression(s) / ED Diagnoses Final diagnoses:  Seizure (HCC)  Altered mental status, unspecified altered mental status type     This chart was dictated using voice recognition software.  Despite best efforts to proofread,  errors can occur which can change the documentation meaning.    Francesca Elsie CROME, MD 04/05/23 2325    Francesca Elsie CROME, MD 05/16/23 1239

## 2023-04-05 NOTE — ED Notes (Signed)
 Pharmacist came out room and stated they needed a nurse. I paramedic Prarthana Parlin and a RN walked in to find the patient gazing toward to the left with involuntary movements. Patient appeared to be having a seizure. ED provider notified.

## 2023-04-05 NOTE — ED Notes (Signed)
 Patient transported to CT

## 2023-04-05 NOTE — ED Triage Notes (Signed)
 Family called ems due to pt having increased confusion. Pt was on way with family to hospital when they called ems due to pt posturing. Per ems pt only responded to pain when they got to pt. Pt arrived to ED alert, sitting up. Cbg in route 186. A fib in route with history. Oriented to most at this time.

## 2023-04-05 NOTE — ED Notes (Signed)
 Pt oriented x3, stated this month was December.  Was able to state President. Pt c/o headache. Stated he did not know why he was here.

## 2023-04-05 NOTE — ED Notes (Signed)
 ED Provider at bedside.

## 2023-04-05 NOTE — ED Provider Notes (Signed)
 Care assumed from Dr. Francesca, patient with lung cancer, seizure, now post-ictal. To be observed to see if he returns to baseline. If not, he needs to be transferred to [facility] for EEG monitoring.  Patient reevaluated, he is drowsy but arousable and oriented to person and place.  No concern for ongoing status epilepticus.  He will need to be admitted for MRI scan, possible lumbar puncture.  Per neurology, this would best be managed at [facility].  I discussed case with Dr. Keturah of [facility], who agrees to admit the patient.   Raford Lenis, MD 04/06/23 [phone]

## 2023-04-05 NOTE — ED Notes (Signed)
 Unable to perform neuro assessment due to patient being asleep after medication.

## 2023-04-06 ENCOUNTER — Other Ambulatory Visit: Payer: Self-pay

## 2023-04-06 ENCOUNTER — Other Ambulatory Visit (HOSPITAL_COMMUNITY): Payer: Self-pay

## 2023-04-06 ENCOUNTER — Inpatient Hospital Stay (HOSPITAL_COMMUNITY)
Admit: 2023-04-06 | Discharge: 2023-04-06 | Disposition: A | Discharge status: Home / Self Care | Payer: No Typology Code available for payment source | Attending: Internal Medicine

## 2023-04-06 ENCOUNTER — Inpatient Hospital Stay (HOSPITAL_COMMUNITY): Payer: No Typology Code available for payment source

## 2023-04-06 DIAGNOSIS — Z7982 Long term (current) use of aspirin: Secondary | ICD-10-CM | POA: Diagnosis not present

## 2023-04-06 DIAGNOSIS — G9341 Metabolic encephalopathy: Secondary | ICD-10-CM | POA: Diagnosis not present

## 2023-04-06 DIAGNOSIS — I5043 Acute on chronic combined systolic (congestive) and diastolic (congestive) heart failure: Secondary | ICD-10-CM | POA: Diagnosis not present

## 2023-04-06 DIAGNOSIS — G0481 Other encephalitis and encephalomyelitis: Secondary | ICD-10-CM | POA: Diagnosis present

## 2023-04-06 DIAGNOSIS — J9622 Acute and chronic respiratory failure with hypercapnia: Secondary | ICD-10-CM | POA: Diagnosis present

## 2023-04-06 DIAGNOSIS — E782 Mixed hyperlipidemia: Secondary | ICD-10-CM | POA: Diagnosis present

## 2023-04-06 DIAGNOSIS — Z953 Presence of xenogenic heart valve: Secondary | ICD-10-CM | POA: Diagnosis not present

## 2023-04-06 DIAGNOSIS — J9601 Acute respiratory failure with hypoxia: Secondary | ICD-10-CM | POA: Diagnosis not present

## 2023-04-06 DIAGNOSIS — Z9981 Dependence on supplemental oxygen: Secondary | ICD-10-CM | POA: Diagnosis not present

## 2023-04-06 DIAGNOSIS — Z9289 Personal history of other medical treatment: Secondary | ICD-10-CM | POA: Diagnosis not present

## 2023-04-06 DIAGNOSIS — I480 Paroxysmal atrial fibrillation: Secondary | ICD-10-CM

## 2023-04-06 DIAGNOSIS — R569 Unspecified convulsions: Principal | ICD-10-CM

## 2023-04-06 DIAGNOSIS — E876 Hypokalemia: Secondary | ICD-10-CM | POA: Diagnosis present

## 2023-04-06 DIAGNOSIS — Z66 Do not resuscitate: Secondary | ICD-10-CM | POA: Diagnosis not present

## 2023-04-06 DIAGNOSIS — I700 Atherosclerosis of aorta: Secondary | ICD-10-CM | POA: Diagnosis present

## 2023-04-06 DIAGNOSIS — M1990 Unspecified osteoarthritis, unspecified site: Secondary | ICD-10-CM | POA: Diagnosis present

## 2023-04-06 DIAGNOSIS — Z515 Encounter for palliative care: Secondary | ICD-10-CM | POA: Diagnosis not present

## 2023-04-06 DIAGNOSIS — R7303 Prediabetes: Secondary | ICD-10-CM | POA: Diagnosis present

## 2023-04-06 DIAGNOSIS — I110 Hypertensive heart disease with heart failure: Secondary | ICD-10-CM | POA: Diagnosis present

## 2023-04-06 DIAGNOSIS — R4182 Altered mental status, unspecified: Secondary | ICD-10-CM

## 2023-04-06 DIAGNOSIS — I4891 Unspecified atrial fibrillation: Secondary | ICD-10-CM | POA: Diagnosis not present

## 2023-04-06 DIAGNOSIS — J449 Chronic obstructive pulmonary disease, unspecified: Secondary | ICD-10-CM | POA: Diagnosis present

## 2023-04-06 DIAGNOSIS — R627 Adult failure to thrive: Secondary | ICD-10-CM | POA: Diagnosis present

## 2023-04-06 DIAGNOSIS — I5042 Chronic combined systolic (congestive) and diastolic (congestive) heart failure: Secondary | ICD-10-CM

## 2023-04-06 DIAGNOSIS — J9602 Acute respiratory failure with hypercapnia: Secondary | ICD-10-CM | POA: Diagnosis not present

## 2023-04-06 DIAGNOSIS — I2510 Atherosclerotic heart disease of native coronary artery without angina pectoris: Secondary | ICD-10-CM | POA: Diagnosis present

## 2023-04-06 DIAGNOSIS — J9621 Acute and chronic respiratory failure with hypoxia: Secondary | ICD-10-CM

## 2023-04-06 DIAGNOSIS — R0609 Other forms of dyspnea: Secondary | ICD-10-CM | POA: Diagnosis not present

## 2023-04-06 DIAGNOSIS — Z7901 Long term (current) use of anticoagulants: Secondary | ICD-10-CM | POA: Diagnosis not present

## 2023-04-06 DIAGNOSIS — Z7951 Long term (current) use of inhaled steroids: Secondary | ICD-10-CM | POA: Diagnosis not present

## 2023-04-06 DIAGNOSIS — I5022 Chronic systolic (congestive) heart failure: Secondary | ICD-10-CM | POA: Diagnosis not present

## 2023-04-06 DIAGNOSIS — I255 Ischemic cardiomyopathy: Secondary | ICD-10-CM | POA: Diagnosis present

## 2023-04-06 DIAGNOSIS — C3412 Malignant neoplasm of upper lobe, left bronchus or lung: Secondary | ICD-10-CM | POA: Diagnosis present

## 2023-04-06 DIAGNOSIS — J690 Pneumonitis due to inhalation of food and vomit: Secondary | ICD-10-CM | POA: Diagnosis not present

## 2023-04-06 DIAGNOSIS — G40909 Epilepsy, unspecified, not intractable, without status epilepticus: Secondary | ICD-10-CM | POA: Diagnosis present

## 2023-04-06 DIAGNOSIS — I4819 Other persistent atrial fibrillation: Secondary | ICD-10-CM | POA: Diagnosis present

## 2023-04-06 LAB — CBC
HCT: 27.7 % — ABNORMAL LOW (ref 39.0–52.0)
Hemoglobin: 8.8 g/dL — ABNORMAL LOW (ref 13.0–17.0)
MCH: 27.3 pg (ref 26.0–34.0)
MCHC: 31.8 g/dL (ref 30.0–36.0)
MCV: 86 fL (ref 80.0–100.0)
Platelets: 574 10*3/uL — ABNORMAL HIGH (ref 150–400)
RBC: 3.22 MIL/uL — ABNORMAL LOW (ref 4.22–5.81)
RDW: 15.3 % (ref 11.5–15.5)
WBC: 18.1 10*3/uL — ABNORMAL HIGH (ref 4.0–10.5)
nRBC: 0 % (ref 0.0–0.2)

## 2023-04-06 LAB — BASIC METABOLIC PANEL
Anion gap: 12 (ref 5–15)
BUN: 15 mg/dL (ref 8–23)
CO2: 26 mmol/L (ref 22–32)
Calcium: 8.7 mg/dL — ABNORMAL LOW (ref 8.9–10.3)
Chloride: 95 mmol/L — ABNORMAL LOW (ref 98–111)
Creatinine, Ser: 0.75 mg/dL (ref 0.61–1.24)
GFR, Estimated: >60 mL/min (ref >60)
Glucose, Bld: 114 mg/dL — ABNORMAL HIGH (ref 70–99)
Potassium: 3.3 mmol/L — ABNORMAL LOW (ref 3.5–5.1)
Sodium: 133 mmol/L — ABNORMAL LOW (ref 135–145)

## 2023-04-06 LAB — HEPARIN LEVEL (UNFRACTIONATED): Heparin Unfractionated: <0.1 [IU]/mL — ABNORMAL LOW (ref 0.30–0.70)

## 2023-04-06 LAB — PHOSPHORUS: Phosphorus: 3.3 mg/dL (ref 2.5–4.6)

## 2023-04-06 LAB — TROPONIN I (HIGH SENSITIVITY): Troponin I (High Sensitivity): 16 ng/L (ref <18)

## 2023-04-06 LAB — MAGNESIUM: Magnesium: 2.2 mg/dL (ref 1.7–2.4)

## 2023-04-06 LAB — CK: Total CK: 46 U/L — ABNORMAL LOW (ref 49–397)

## 2023-04-06 MED ORDER — DILTIAZEM LOAD VIA INFUSION
10.0000 mg | Freq: Once | INTRAVENOUS | Status: AC
  Administered 2023-04-06: 10 mg via INTRAVENOUS
  Filled 2023-04-06: qty 10

## 2023-04-06 MED ORDER — LORAZEPAM 2 MG/ML IJ SOLN: 2.0000 mg | INTRAMUSCULAR | Status: DC | PRN

## 2023-04-06 MED ORDER — POTASSIUM CHLORIDE 10 MEQ/100ML IV SOLN
10.0000 meq | INTRAVENOUS | Status: AC
Start: 2023-04-06 — End: 2023-04-06
  Administered 2023-04-06: 10 meq via INTRAVENOUS
  Filled 2023-04-06 (×2): qty 100

## 2023-04-06 MED ORDER — FUROSEMIDE 10 MG/ML IJ SOLN
40.0000 mg | Freq: Once | INTRAMUSCULAR | Status: AC
  Administered 2023-04-06: 40 mg via INTRAVENOUS
  Filled 2023-04-06: qty 4

## 2023-04-06 MED ORDER — ASPIRIN 81 MG PO CHEW
81.0000 mg | CHEWABLE_TABLET | Freq: Every day | ORAL | Status: DC
  Administered 2023-04-06 – 2023-04-10 (×5): 81 mg via ORAL
  Filled 2023-04-06 (×5): qty 1

## 2023-04-06 MED ORDER — LEVETIRACETAM IN NACL 500 MG/100ML IV SOLN
500.0000 mg | Freq: Two times a day (BID) | INTRAVENOUS | Status: DC
  Administered 2023-04-06: 500 mg via INTRAVENOUS
  Filled 2023-04-06: qty 100

## 2023-04-06 MED ORDER — PROPOFOL 10 MG/ML IV BOLUS
0.5000 mg/kg | Freq: Once | INTRAVENOUS | Status: AC
Start: 2023-04-06 — End: 2023-04-06
  Administered 2023-04-06: 37.7 mg via INTRAVENOUS
  Filled 2023-04-06: qty 20

## 2023-04-06 MED ORDER — CARVEDILOL 6.25 MG PO TABS
6.2500 mg | ORAL_TABLET | Freq: Two times a day (BID) | ORAL | Status: DC
  Administered 2023-04-06 – 2023-04-07 (×2): 6.25 mg via ORAL
  Filled 2023-04-06 (×2): qty 1

## 2023-04-06 MED ORDER — SODIUM CHLORIDE 0.9% FLUSH
3.0000 mL | Freq: Two times a day (BID) | INTRAVENOUS | Status: DC
  Administered 2023-04-06 – 2023-04-10 (×8): 3 mL via INTRAVENOUS

## 2023-04-06 MED ORDER — AMIODARONE HCL IN DEXTROSE 360-4.14 MG/200ML-% IV SOLN
30.0000 mg/h | INTRAVENOUS | Status: DC
  Administered 2023-04-06 (×2): 30 mg/h via INTRAVENOUS
  Filled 2023-04-06 (×3): qty 200

## 2023-04-06 MED ORDER — SACUBITRIL-VALSARTAN 49-51 MG PO TABS: 1.0000 | ORAL_TABLET | Freq: Two times a day (BID) | ORAL | Status: DC

## 2023-04-06 MED ORDER — ONDANSETRON HCL 4 MG/2ML IJ SOLN: 4.0000 mg | Freq: Four times a day (QID) | INTRAMUSCULAR | Status: DC | PRN

## 2023-04-06 MED ORDER — HEPARIN BOLUS VIA INFUSION
4000.0000 [IU] | Freq: Once | INTRAVENOUS | Status: AC
  Administered 2023-04-06: 4000 [IU] via INTRAVENOUS
  Filled 2023-04-06: qty 4000

## 2023-04-06 MED ORDER — DILTIAZEM HCL-DEXTROSE 125-5 MG/125ML-% IV SOLN (PREMIX)
5.0000 mg/h | INTRAVENOUS | Status: DC
  Administered 2023-04-06: 5 mg/h via INTRAVENOUS
  Filled 2023-04-06: qty 125

## 2023-04-06 MED ORDER — MOMETASONE FURO-FORMOTEROL FUM 200-5 MCG/ACT IN AERO
2.0000 | INHALATION_SPRAY | Freq: Two times a day (BID) | RESPIRATORY_TRACT | Status: DC
  Administered 2023-04-06 – 2023-04-08 (×6): 2 via RESPIRATORY_TRACT
  Filled 2023-04-06: qty 8.8

## 2023-04-06 MED ORDER — AMIODARONE HCL IN DEXTROSE 360-4.14 MG/200ML-% IV SOLN
60.0000 mg/h | INTRAVENOUS | Status: DC
  Administered 2023-04-06: 60 mg/h via INTRAVENOUS
  Filled 2023-04-06: qty 200

## 2023-04-06 MED ORDER — LEVETIRACETAM 500 MG PO TABS
500.0000 mg | ORAL_TABLET | Freq: Two times a day (BID) | ORAL | Status: DC
  Administered 2023-04-06 – 2023-04-07 (×2): 500 mg via ORAL
  Filled 2023-04-06 (×3): qty 1

## 2023-04-06 MED ORDER — SACUBITRIL-VALSARTAN 24-26 MG PO TABS
1.0000 | ORAL_TABLET | Freq: Two times a day (BID) | ORAL | Status: DC
  Administered 2023-04-06 – 2023-04-10 (×9): 1 via ORAL
  Filled 2023-04-06 (×9): qty 1

## 2023-04-06 MED ORDER — GADOBUTROL 1 MMOL/ML IV SOLN
7.0000 mL | Freq: Once | INTRAVENOUS | Status: AC | PRN
  Administered 2023-04-06: 7 mL via INTRAVENOUS

## 2023-04-06 MED ORDER — HEPARIN (PORCINE) 25000 UT/250ML-% IV SOLN
1450.0000 [IU]/h | INTRAVENOUS | Status: DC
  Administered 2023-04-06: 1200 [IU]/h via INTRAVENOUS
  Administered 2023-04-06: 1000 [IU]/h via INTRAVENOUS
  Filled 2023-04-06 (×2): qty 250

## 2023-04-06 MED ORDER — POTASSIUM CHLORIDE 10 MEQ/100ML IV SOLN
10.0000 meq | INTRAVENOUS | Status: AC
  Administered 2023-04-06 (×3): 10 meq via INTRAVENOUS
  Filled 2023-04-06 (×3): qty 100

## 2023-04-06 MED ORDER — AMIODARONE LOAD VIA INFUSION
150.0000 mg | Freq: Once | INTRAVENOUS | Status: AC
  Administered 2023-04-06: 150 mg via INTRAVENOUS
  Filled 2023-04-06: qty 83.34

## 2023-04-06 MED ORDER — ALBUTEROL SULFATE (2.5 MG/3ML) 0.083% IN NEBU: 2.5000 mg | INHALATION_SOLUTION | RESPIRATORY_TRACT | Status: DC | PRN

## 2023-04-06 MED ORDER — TORSEMIDE 20 MG PO TABS
20.0000 mg | ORAL_TABLET | Freq: Every day | ORAL | Status: DC
  Administered 2023-04-07 – 2023-04-08 (×2): 20 mg via ORAL
  Filled 2023-04-06 (×2): qty 1

## 2023-04-06 MED ORDER — HEPARIN BOLUS VIA INFUSION
4000.0000 [IU] | Freq: Once | INTRAVENOUS | Status: AC
  Administered 2023-04-06: 4000 [IU] via INTRAVENOUS

## 2023-04-06 NOTE — Plan of Care (Signed)
 Admitted at 1610, oriented to surrounding. With ongoing Amiodarone  and Heparin . Calm and resting Comfortably.   Problem: Education: Goal: Knowledge of General Education information will improve Description: Including pain rating scale, medication(s)/side effects and non-pharmacologic comfort measures Outcome: Progressing   Problem: Activity: Goal: Risk for activity intolerance will decrease Outcome: Progressing   Problem: Nutrition: Goal: Adequate nutrition will be maintained Outcome: Progressing   Problem: Safety: Goal: Ability to remain free from injury will improve Outcome: Progressing

## 2023-04-06 NOTE — Progress Notes (Signed)
   04/06/23 1142  TOC Brief Assessment  Insurance and Status Reviewed  Patient has primary care physician Yes  Home environment has been reviewed from home  Prior level of function: independent  Prior/Current Home Services No current home services  Social Drivers of Health Review SDOH reviewed no interventions necessary  Readmission risk has been reviewed Yes  Transition of care needs no transition of care needs at this time    Pt admitted from home. Plan is for transfer to [facility] for neuro services. [facility] notification [facility] completed. Reference number is [id number].  [facility] will follow as needed.

## 2023-04-06 NOTE — Sedation Documentation (Signed)
 2nd synchronized shock at 200 J delivered due to persistent Afib w/ RVR

## 2023-04-06 NOTE — Consult Note (Addendum)
 Cardiology Consultation   Patient ID: [facility] MRN: [id number]; DOB: May 07, 1940  Admit date: 04/05/2023 Date of Consult: 04/06/2023  PCP:  [name], MD   [facility] Providers Cardiologist:  Deatrice Cage, MD        Patient Profile:   [facility] is a 83 y.o. male with a hx of with hx stage II lung adenocarcinoma currently planned for upcoming radiation therapy, remote history of rectal cancer status post colostomy, aortic stenosis status post bioprosthetic TAVR 8/2023coronary artery disease, heart failure with ejection fraction 45-50% 10/2022, hypertension-recent hypotension, hyperlipidemia, COPD, chronic hypoxic respiratory failure on 4 L O2, chronic hyponatremia,   who is being seen 04/06/2023 for the evaluation of Afib with RVR and ischemic EKG changes/hypoxia s/p synchronized cardioversion in ED x2 at the request of Dr. Sebastian.  History of Present Illness:   [facility] with above PMH was most recently seen in office for hypotension.entresto  dose decreased and spironolactone  stopped. Yesterday family noted him to be confused with possible seizure activity and brought him to the ED. Initial EKG multifocal ectopic atrial rhythm but went into Afib with RVR with diffuse ST depression, aVR elevation, hypoxia and underwent synchronized cardioverion x 2.  Patient currently alert and oriented to place and time. He is difficult to understand but denies any cardiac complaints or complaints in general. On IV heparin  and amiodarone -both need to be held for MRI which is ok per Dr. Alvan. Awaiting transfer to [facility].   Past Medical History:  Diagnosis Date   Agent orange exposure    Anemia    Anxiety    Aortic atherosclerosis (HCC)    Aortic stenosis 07/04/2013   a.)TTE 07/04/13: mild (MPG 12; AVA 1.6); b.) TTE 01/01/16: mod (MPG 14; AVA 1.17); c.) TTE 08/30/17: mod (MPG 15; AVA 1.25); d.) TTE 07/13/19: mod (MPG 19; AVA 1.16); e.) TTE 07/23/20: mod (MPG 22; AVA  1.25); f.) TTE 03/12/2021: mod-sev (MPH 34; AVA 0.75); g.) TTE 05/21/2021: mod (MPG 22; AVA 0.8); h.) RHC 08/27/2020: sev (MPG 26.18; AVA 0.69); i.) s/p TAVR 11/25/21: 26 mm Edwards S3 Ultra Resilia   Arthritis 03/12/2011   Cardiomegaly    Cerebral microvascular disease    Cholelithiasis    Chronic hyponatremia    Chronic obstructive lung disease (HCC)    Colostomy in place [facility])    a.) s/p resection secondary to rectal cancer   Coronary artery disease 03/2013   a.) LHC 04/10/2013: 10% mLAD, 50% D1, 30% D2, 99% mLCx, 50% OM1, 40% pRCA, 30% mRCA, 60% RPDA, 40% RPLS - med mgmt; b.) MV 08/31/2019: basal inflat/mid inflat defect c/w ischemia in LCx distribution. EF 37%; c.) R/LHC 08/27/2020: 50% D1, 50% D2, 90% mLCx, 40% p-mRCA, 80% RPDA - med mgmt   Former smoker    HFrEF (heart failure with reduced ejection fraction) (HCC)    a.) TEE 04/13/13: EF 20-25, glob HK, G1DD, mild LAE, mod-sev MR; b.) TTE 08/30/17: EF 45-50, LVH, mild LAE, G1DD, mod AS, mod MR; c.) TTE 07/13/19: EF 30-35, glob HK, G1DD, RVSP 49.4, mild LAE, mild-mod MR, mod AS; d.) TTE 03/12/21: EF 55, G2DD, mild MR, mod-sev AS; e.) TTE 12/24/21: EF 50, mild LVH, G1DD, RVSP 40.5, mild-mod MR, triv AR; f.) TTE 11/18/22: EF 45-50, glob HK, G1DD, mild MR   History of bilateral cataract extraction    Hyperlipidemia    Hypertension    Hypokalemia    Ischemic cardiomyopathy    a.) TTE 04/08/2014: EF 25-30%; b.) TEE  04/13/2013: EF 20-25%; c.) TTE 07/04/2013: EF 30-35%; d.) TTE 01/01/2016: EF 40-45%; e.) TTE 08/30/2017: EF 45-50%; f.) TTE 07/03/2019: EF 30-35%; g.) MV 08/31/2019: EF 37%; h.) TTE 07/23/20: EF 55-60%; i.) TTE 03/12/2021: EF 55%; j.) TTE 05/21/2021: EF 40-45%; k.) TTE 11/26/2021: EF 45-50%; l.) TTE 12/24/2021: EF 50%; m.) TTE 11/18/2022: EF 45-50%   Long term (current) use of aspirin     Mass of upper lobe of left lung 03/10/2021   a.) CTA chest 03/10/2021: 6.1 mm; b.) CTA chest 10/15/2021: 8 mm; c.) PET CT 11/14/2021: 8 mm (SUV max 2.8);  d.) CXR 10/05/2022: interval increase to 2.7 cm; e.) CXR 01/18/2023: interval increase to 3.7 cm; f.) CT chest 01/22/2023: 3.8 x 3.2 cm; g.) PET CT 02/01/2023: 3.8 cm and markedly hypermetabolic with SUV max of 43   Multifocal atrial tachycardia (HCC)    Myocardial infarction (HCC)    a.) suspected by cardiology during 03/2013 admission for CP/CAP/new onset CHF --> TTE  showed severe LV dysfunction with segmental wall motion abnormalities and severe MR (posteriorly-directed  jet)   Pneumonia    Pre-diabetes    Pulmonary hypertension (HCC)    a.) TTE 04/08/2013: RVSP 62.2; b.) TTE 07/13/2019: RVSP 49.4; c.) TTE 07/23/2020: RVSP 33.2; d.) TTE 11/26/2021: RVSP 41.2; e.) TTE 12/24/2021: RVSP 40.5; f.) TTE 11/18/2019: RVSP 29.8   Rectal cancer (HCC)    Requires continuous at home supplemental oxygen     a.) 4L/[place]   S/P TAVR (transcatheter aortic valve replacement) 11/25/2021   a.) 26mm Edwards Sapien 3 Ultra Resilia via the TF approach   Statin intolerance    Umbilical hernia (s/p repair)     Past Surgical History:  Procedure Laterality Date   APPENDECTOMY  06/29/2003   CATARACT EXTRACTION, BILATERAL Bilateral    COLOSTOMY     HERNIA REPAIR     INTRAOPERATIVE TRANSTHORACIC ECHOCARDIOGRAM N/A 11/25/2021   Procedure: INTRAOPERATIVE TRANSTHORACIC ECHOCARDIOGRAM;  Surgeon: Verlin Lonni BIRCH, MD;  Location: [facility] OR;  Service: Open Heart Surgery;  Laterality: N/A;   resection of rectum     RIGHT/LEFT HEART CATH AND CORONARY ANGIOGRAPHY Bilateral 04/10/2013   Procedure: RIGHT/LEFT HEART CATH AND CORONARY ANGIOGRAPHY; Location: [facility]; Surgeon: Deatrice Cage, MD   RIGHT/LEFT HEART CATH AND CORONARY ANGIOGRAPHY N/A 08/27/2021   Procedure: RIGHT/LEFT HEART CATH AND CORONARY ANGIOGRAPHY;  Surgeon: Wonda Sharper, MD;  Location: [facility] INVASIVE CV LAB;  Service: Cardiovascular;  Laterality: N/A;   TRANSCATHETER AORTIC VALVE REPLACEMENT, TRANSFEMORAL Left 11/25/2021   Procedure: Transcatheter Aortic  Valve Replacement, Transfemoral using a 26 MM Edwards SAPIEN 3 Ultra.;  Surgeon: Verlin Lonni BIRCH, MD;  Location: [facility] OR;  Service: Open Heart Surgery;  Laterality: Left;   VIDEO BRONCHOSCOPY WITH ENDOBRONCHIAL ULTRASOUND N/A 03/03/2023   Procedure: VIDEO BRONCHOSCOPY WITH ENDOBRONCHIAL ULTRASOUND;  Surgeon: Parris Manna, MD;  Location: [facility] ORS;  Service: Thoracic;  Laterality: N/A;     Home Medications:  Prior to Admission medications   Medication Sig Start Date End Date Taking? Authorizing Provider  acetaminophen  (TYLENOL ) 500 MG tablet Take 500 mg by mouth at bedtime.   Yes [provider]  albuterol  (PROVENTIL ) (2.5 MG/3ML) 0.083% nebulizer solution Take 2.5 mg by nebulization at bedtime. 10/13/21  Yes [provider]  albuterol  (VENTOLIN  HFA) 108 (90 Base) MCG/ACT inhaler INHALE 2 PUFFS BY MOUTH EVERY 6 HOURS AS NEEDED FOR SHORTNESS OF BREATH Patient taking differently: 1-2 puffs every 6 (six) hours as needed. INHALE 2 PUFFS BY MOUTH EVERY 6 HOURS AS NEEDED FOR SHORTNESS OF BREATH  08/29/18  Yes Gretta Comer POUR, NP  Alirocumab  (PRALUENT ) 75 MG/ML SOAJ Inject 1 pen into the skin every 14 (fourteen) days. 08/25/18  Yes Darron Deatrice LABOR, MD  aspirin  81 MG tablet Take 81 mg by mouth at bedtime.   Yes [provider]  carvedilol  (COREG ) 6.25 MG tablet Take 3.125 mg by mouth 2 (two) times daily with a meal.   Yes [provider]  cholecalciferol  (VITAMIN D3) 25 MCG (1000 UNIT) tablet Take 1,000 Units by mouth daily.   Yes [provider]  dapagliflozin  propanediol (FARXIGA ) 10 MG TABS tablet TAKE ONE TABLET BY MOUTH EVERY MORNING BEFORE BREAKFAST 02/24/23  Yes Darron Deatrice LABOR, MD  diclofenac Sodium (VOLTAREN) 1 % GEL 2 g as needed. 04/08/21  Yes [provider]  fluticasone -salmeterol (ADVAIR) 250-50 MCG/ACT AEPB Inhale 1 puff into the lungs in the morning and at bedtime. 08/31/22  Yes [provider]  Magnesium  Oxide 400 (240  Mg) MG TABS TAKE 1 TABLET BY MOUTH ONCE DAILY. Patient taking differently: Take 400 mg by mouth daily. 05/03/17  Yes Darron Deatrice LABOR, MD  OXYGEN  Inhale 4 L into the lungs continuous.   Yes [provider]  sacubitril -valsartan  (ENTRESTO ) 49-51 MG Take half a tablet twice daily 02/23/23  Yes Darron Deatrice LABOR, MD  sodium chloride  1 g tablet Take 1 g by mouth daily with lunch. 1/2 tab 01/20/23 01/20/24 Yes [provider]  torsemide  (DEMADEX ) 20 MG tablet Take 20 mg by mouth daily.   Yes [provider]  traZODone  (DESYREL ) 50 MG tablet Take 1 tablet (50 mg total) by mouth at bedtime as needed. for sleep 08/29/18  Yes Clark, Katherine K, NP    Inpatient Medications: Scheduled Meds:  aspirin   81 mg Oral QHS   levETIRAcetam   500 mg Oral Q12H   mometasone -formoterol   2 puff Inhalation BID   sodium chloride  flush  3 mL Intravenous Q12H   Continuous Infusions:  amiodarone  60 mg/hr (04/06/23 0515)   Followed by   amiodarone      heparin  1,000 Units/hr (04/06/23 0547)   levETIRAcetam      potassium chloride  Stopped (04/06/23 0821)   PRN Meds: LORazepam , ondansetron  (ZOFRAN ) IV  Allergies:    Allergies  Allergen Reactions   Codeine Nausea Only   Statins Other (See Comments)    REACTION: myalgias    Social History:   Social History   Socioeconomic History   Marital status: Married    Spouse name: Dorothyann   Number of children: 2   Years of education: Not on file   Highest education level: Not on file  Occupational History   Not on file  Tobacco Use   Smoking status: Former    Current packs/day: 0.00    Average packs/day: 1 pack/day for 50.0 years (50.0 ttl pk-yrs)    Types: Cigarettes    Start date: 03/26/1963    Quit date: 03/25/2013    Years since quitting: 10.0   Smokeless tobacco: Never  Vaping Use   Vaping status: Never Used  Substance and Sexual Activity   Alcohol  use: No    Alcohol /week: 0.0 standard drinks of alcohol    Drug use: No    Sexual activity: Not Currently  Other Topics Concern   Not on file  Social History Narrative   Arm '66-'68, [place], agent orange exposure.    Social Drivers of Health   Financial Resource Strain: Low Risk  (01/26/2023)   Overall Financial Resource Strain (CARDIA)    Difficulty of  Paying Living Expenses: Not hard at all  Food Insecurity: No Food Insecurity (01/26/2023)   Hunger Vital Sign    Worried About Running Out of Food in the Last Year: Never true    Ran Out of Food in the Last Year: Never true  Transportation Needs: No Transportation Needs (01/26/2023)   PRAPARE - Administrator, Civil Service (Medical): No    Lack of Transportation (Non-Medical): No  Physical Activity: Not on file  Stress: Not on file  Social Connections: Not on file  Intimate Partner Violence: Not on file    Family History:     Family History  Problem Relation Age of Onset   Heart disease Mother    Heart disease Father    Hyperlipidemia Father    Hypertension Father    Heart disease Sister        stents placed    Cancer - Cervical Sister    Heart disease Sister        CABG   Heart attack Brother 64       MI     ROS:  Please see the history of present illness.  Review of Systems  Reason unable to perform ROS: difficult to understand.    All other ROS reviewed and negative.     Physical Exam/Data:   Vitals:   04/06/23 0730 04/06/23 0745 04/06/23 0800 04/06/23 0815  BP: 110/64 125/70 (!) 117/58 118/85  Pulse: 83 96 88 93  Resp: 17 (!) 24 17 (!) 21  Temp:      TempSrc:      SpO2: 95% 94% 95% 95%  Weight:      Height:        Intake/Output Summary (Last 24 hours) at 04/06/2023 0828 Last data filed at 04/06/2023 9487 Gross per 24 hour  Intake 117.75 ml  Output 1200 ml  Net -1082.25 ml      04/06/2023    4:30 AM 04/05/2023    9:51 PM 04/05/2023    9:00 PM  Last 3 Weights  Weight (lbs) 166 lb 0.1 oz 166 lb 1.6 oz 166 lb 7.2 oz  Weight (kg) 75.3 kg 75.342 kg 75.5 kg      Body mass index is 26.79 kg/m.  General:  Well nourished, well developed, in no acute distress on O2 mask HEENT: normal Neck: no JVD Vascular: No carotid bruits; Distal pulses 2+ bilaterally Cardiac:  normal S1, S2; RRR; 1/6 systolic murmur LSB Lungs:  decreased breath sounds throughout but no rhonchi or rales  Abd: soft, nontender, no hepatomegaly  Ext: no edema Musculoskeletal:  No deformities, BUE and BLE strength normal and equal Skin: warm and dry  Neuro:  CNs 2-12 intact, no focal abnormalities noted Psych:  Normal affect   EKG:  The EKG was personally reviewed and demonstrates:  currently NSR with atrial arrhythmia, earlier Afib with RVR 159/m diffuse ST depression and ST elevation aVR Telemetry:  Telemetry was personally reviewed and demonstrates:  NSR with PAC's 103/m  Relevant CV Studies:  Echo 10/2022 IMPRESSIONS     1. Left ventricular ejection fraction, by estimation, is 45 to 50%. Left  ventricular ejection fraction by 3D volume is 47 %. The left ventricle has  mildly decreased function. The left ventricle demonstrates global  hypokinesis. Left ventricular diastolic   parameters are consistent with Grade I diastolic dysfunction (impaired  relaxation). Elevated left ventricular end-diastolic pressure.   2. Right ventricular systolic function is normal. The right ventricular  size is  normal. There is normal pulmonary artery systolic pressure.   3. The mitral valve is normal in structure. Mild mitral valve  regurgitation. No evidence of mitral stenosis.   4. The aortic valve has been repaired/replaced. Aortic valve  regurgitation is not visualized. No aortic stenosis is present. Echo  findings are consistent with normal structure and function of the aortic  valve prosthesis. Aortic valve area, by VTI  measures 3.79 cm. Aortic valve mean gradient measures 6.0 mmHg. Aortic  valve Vmax measures 1.73 m/s.   5. The inferior vena cava is normal in size with greater than  50%  respiratory variability, suggesting right atrial pressure of 3 mmHg.    Laboratory Data:  High Sensitivity Troponin:   Recent Labs  Lab 04/06/23 0448  TROPONINIHS 16     Chemistry Recent Labs  Lab 04/05/23 1902 04/06/23 0448  NA 131* 133*  K 3.4* 3.3*  CL 93* 95*  CO2 27 26  GLUCOSE 166* 114*  BUN 14 15  CREATININE 0.77 0.75  CALCIUM 8.7* 8.7*  MG 2.1 2.2  GFRNONAA >60 >60  ANIONGAP 11 12    Recent Labs  Lab 04/05/23 1902  PROT 7.5  ALBUMIN 2.6*  AST 13*  ALT 12  ALKPHOS 127*  BILITOT 0.6   Lipids No results for input(s): CHOL, TRIG, HDL, LABVLDL, LDLCALC, CHOLHDL in the last 168 hours.  Hematology Recent Labs  Lab 04/05/23 1902 04/06/23 0448  WBC 18.6* 18.1*  RBC 3.36* 3.22*  HGB 8.8* 8.8*  HCT 29.0* 27.7*  MCV 86.3 86.0  MCH 26.2 27.3  MCHC 30.3 31.8  RDW 15.0 15.3  PLT 574* 574*   Thyroid   Recent Labs  Lab 04/05/23 1902  TSH 3.799    BNP Recent Labs  Lab 04/05/23 1902  BNP 233.0*    DDimer No results for input(s): DDIMER in the last 168 hours.   Radiology/Studies:  DG CHEST PORT 1 VIEW Result Date: 04/06/2023 CLINICAL DATA:  Lung cancer patient presents with hypoxia. [id number]. EXAM: PORTABLE CHEST 1 VIEW COMPARISON:  Portable chest yesterday at 8:02 p.m. FINDINGS: Interval increased patchy consolidation in the left lower lung field concerning for pneumonia or aspiration. Emphysematous changes and biapical scarring with nearly 5 cm left upper lobe mass are again noted. Remaining lungs are clear. The cardiac size is normal. A TAVR is again noted with aortic tortuosity and calcifications, stable mediastinum. There is suspected a small left pleural effusion as well. No new osseous findings. IMPRESSION: 1. Interval increased patchy consolidation in the left lower lung field concerning for pneumonia or aspiration. 2. Suspected small left pleural effusion. 3. Emphysematous changes and biapical scarring with nearly 5 cm known left  upper lobe mass. Electronically Signed   By: Francis Quam M.D.   On: 04/06/2023 05:10   DG Chest Port 1 View Result Date: 04/05/2023 CLINICAL DATA:  Chest pain EXAM: PORTABLE CHEST 1 VIEW COMPARISON:  03/06/2023 FINDINGS: Again seen is the left upper lobe mass compatible with known primary lung cancer. Patchy opacities in the right upper lobe are unchanged. This likely reflects scarring as seen on prior chest CT. No acute confluent opacities or effusions. Heart is normal size. No acute bony abnormality. IMPRESSION: Left upper lobe mass again noted as seen on prior imaging compatible with known lung cancer. No acute cardiopulmonary disease. Electronically Signed   By: Franky Crease M.D.   On: 04/05/2023 20:21   CT Head Wo Contrast Result Date: 04/05/2023 CLINICAL DATA:  Headache EXAM: CT HEAD WITHOUT  CONTRAST TECHNIQUE: Contiguous axial images were obtained from the base of the skull through the vertex without intravenous contrast. RADIATION DOSE REDUCTION: This exam was performed according to the departmental dose-optimization program which includes automated exposure control, adjustment of the mA and/or kV according to patient size and/or use of iterative reconstruction technique. COMPARISON:  Head CT 01/18/2023.  MRI brain 01/14/2023. FINDINGS: Brain: No evidence of acute infarction, hemorrhage, hydrocephalus, extra-axial collection or mass lesion/mass effect. Again seen is mild diffuse atrophy. There is extensive periventricular and deep white matter hypodensity which is similar to the prior examination. Vascular: Atherosclerotic calcifications are present within the cavernous internal carotid arteries. Skull: Normal. Negative for fracture or focal lesion. Sinuses/Orbits: No acute finding. Other: None. IMPRESSION: 1. No acute intracranial process. 2. Stable atrophy and chronic small vessel ischemic changes. Electronically Signed   By: Greig Pique M.D.   On: 04/05/2023 20:14     Assessment and Plan:    Afib with RVR with diffuse ST depression, aVR elevation, hypoxia s/p synchronized cardioversion x 2, currently maintaining NSR on IV amiodarone  and IV heparin -ok to hold both for MRI per Dr. Alvan, troponins normal -K 3.3 needs replaced  New onset Seizures-per neurology  CAD Stable severe single-vessel CAD in mid LCx per angiography May 2023 found to be unchanged from January 2015.   -was on aspirin , Praluent , Zetia . -denies chest pain   Chronic HFrEF Echo August 2024 showed EF 45 to 50%, global hypokinesis, Grade I DD.  Patient with baseline shortness of breath. He is on 4 L continuous O2 supplementation.-Continue torsemide , Entresto .  -BNP 233, compensated -received IV lasix  40 this am   Severe aortic stenosis s/p TAVR August 2023 Echo August 2024 showed normal structure and function of aortic valve prosthesis without regurgitation or stenosis, mean gradient 6 mmHg.      Recent trouble with Hypotension-entresto  decreased, spironolactone  and coreg  stopped recently.  Elevated WBC 18.1-per primary team  Anemia Hgb 8.8  HLD intolerant to statins, on praluent  and zetia     Risk Assessment/Risk Scores:          CHA2DS2-VASc Score = 5   This indicates a 7.2% annual risk of stroke. The patient's score is based upon: CHF History: 1 HTN History: 1 Diabetes History: 0 Stroke History: 0 Vascular Disease History: 1 Age Score: 2 Gender Score: 0         For questions or updates, please contact [facility] Please consult [email or web address] for contact info under    Signed, Olivia Pavy, PA-C  04/06/2023 8:28 AM  Attending note  Patient seen and discussed with PA Pavy, I agree with her documentation. 83 yo male history of CAD with LCX CTO managed medically, chronic HFmrEF, aortic stenosis s/p TAVR 10/2021, lung cancer, chronic respiratory failure on home O2 4L,  presented with AMS and seizure like activity at home, witnessed geeralized tonic-clonic seizure in ER  that resolved with ativan . While in ER developed afib with RVR requiring DCCV x 2 in ER, subsequently started on IV amio and hep gtt.   K 3.4 Cr 0.77 BUN 14 WBC 18.6 Hg 8.8 Plt 574 Mg 2.1 BNP 233 TSH 3.799 Trop 16--> EKG afib, ST depression lateral precordial leads CXR LUL mass CT head: no acute process MRI head: no acute process  1.Afib - new diagnosis this admission in setting of active seizures - initially started on cardizem  drip, from notes was decompensating from respiratory standpoint and required cardioversion in the ER. Subsequently started on IV amiodarone , IV heparin  -  remains in SR this AM. Continue IV amiodarone  additional 24 hours, if remains in SR then transition to oral amiodarone . Given lung disease would plan on short course of amiodarone , likely exacerbating factor for arrhythmia was severe systemic stress from seizures, also some hypokalemia.  - started on heparin  gtt, can transition to DOAC later in admission.  - lung cancer history presenting with new onset seizures, CT and MRI without evidence of brain mets, continue anticoag   2.HFmrEF - 10/2022 echo: LVE 45-50%, grade I dd,  - medical therapy limited by low bps as outpatient - continue home coreg , entresto . Can hold farxiga  initially as inpatient as patient is NPO   3. CAD - history of LCX CTO managed medically - no acute issues this admission  4. Anemia - per primary team, monitor on hep gtt  5. History of TAVR - 10/2022 echo normal functioning TAVR valve  Dorn Ross MD

## 2023-04-06 NOTE — Sedation Documentation (Signed)
 Spo2 dropping to 80 %, currently assisting with ventilations via BVM.

## 2023-04-06 NOTE — Progress Notes (Signed)
 Patient just arrived. RN needs 15-20 mins for assessment. Wilford Corner, MD aware. LTM to be done on night shift.

## 2023-04-06 NOTE — Sedation Documentation (Signed)
 Synchronized cardioversion 200 J delivered at [id number]

## 2023-04-06 NOTE — ED Notes (Signed)
 Patient unable to perform neuro assessment. Patient is asleep and aroused by painful stimuli.

## 2023-04-06 NOTE — Progress Notes (Signed)
 Progress Note   Patient: Curtis Campos FMW:[id number] DOB: 03/14/1941 DOA: 04/05/2023     0 DOS: the patient was seen and examined on 04/06/2023   Brief hospital admission course: As per H&P written by Dr. Keturah on 04/06/2023 Curtis Campos is a 83 y.o. male with hx of stage II lung adenocarcinoma currently planned for upcoming radiation therapy, remote history of rectal cancer status post colostomy, aortic stenosis status post bioprosthetic TAVR, coronary artery disease, chronic systolic heart failure, hypertension, hyperlipidemia, COPD, chronic hypoxic respiratory failure on 4 L O2 at baseline, HLD (with statin intolerance), paroxysmal atrial fibrillation and chronic hyponatremia, who was brought in from home due to altered mental status followed by what appeared to be possible seizure-like episode.  History is provided by patient's son at the bedside.  Reports that initially patient's wife had noted around 3 AM on 1/6 that he appeared confused and was not making sense.  He had gone back to bed and then later in the afternoon his son saw him and noted that he was confused, unable to answer questions.  When transporting him to the ED in his car the patient seemed to stiffen up and son was worried possibly it was a stroke.  He called EMS who then transported.  In the emergency department he had a witnessed generalized tonic-clonic seizure which was aborted with Ativan  2 mg.    At time of my interview patient awakens, was able to follow commands appropriately and oriented x 2.  Reports no chest pain, no nausea, no vomiting, no feeling short of breath or expressing complaints of palpitation.  Assessment and Plan: 1-acute metabolic encephalopathy/new onset seizure -Concern for postictal event surrounding admission and prior to admission. -At time of my evaluation patient oriented x 2, no active seizure activity appreciated reports no chest pain or shortness of breath. -Workup per neurology service  including MRI and EEG has been requested -Patient will be transferred to [facility] for further neurology evaluation. -Keppra  has been started per Dr. Clayburn and will follow any further rec's. -Seizure precaution and as needed Ativan  has been ordered.  2-acute on chronic respiratory failure with hypoxia -Patient at baseline using 4 L nasal cannula supplementation -Ended transiently requiring up to 10 L supplementation through Venturi mask around episode of A-fib with RVR -Lasix  x 1 dose has been provided -Chest x-ray not demonstrating acute cardiopulmonary process -Will continue to wean off oxygen  supplementation as tolerated -Continue treatment for rate control.  3-A-fib with RVR -Patient with history of paroxysmal atrial fibrillation -Has been started on amiodarone  and heparin  drip -Synchronized cardioversion provided by EDP. -For the most part rate control and sinus after cardioversion. -Cardiology has seen patient send will follow any further recommendations. -Follow electrolytes and replete and with intention/goal for potassium above 4 and magnesium  above 2.  4-chronic combined diastolic and systolic heart failure -BNP 233 -Compensated -Low-sodium diet, daily weights and strict intake and output will be followed. -Follow cardiology service recommendation -Will continue treatment with Entresto  and home dose torsemide . -Echo in August 2024 with EF 45-50% and grade 1 diastolic dysfunction.  5-coronary artery disease -No chest pain -Continue treatment with Aspirin , and resumption of Praluent  and Zetia  at discharge. -will follow rec's from cardiology regarding needs to repeat echo.  6-history of COPD -No wheezing or acute exacerbation currently appreciated -Will continue the use of Dulera  and as needed bronchodilator.  7-history of hyperlipidemia -Patient intolerant to statin -continue outpatient praluent   8-GI prophylaxis -protonix started.  9-hypokalemia   -  Electrolytes will be repleted -Continue telemetry monitoring -Magnesium  within normal limits -Follow electrolyte trend.  10-hx of lung cancer -continue outpatient follow-up with oncology service.  11-leukocytosis -Appears to be secondary to stress demargination/seizure. -No acute source of infection currently appreciated-will hold on antibiotic therapy empirically, will follow WBCs trend.  Subjective:  No chest pain, no palpitations, no nausea, no vomiting; currently no active seizure or focal weakness is appreciated.  Patient oriented x 2 and following commands appropriate.  Physical Exam: Vitals:   04/06/23 0730 04/06/23 0745 04/06/23 0800 04/06/23 0815  BP: 110/64 125/70 (!) 117/58 118/85  Pulse: 83 96 88 93  Resp: 17 (!) 24 17 (!) 21  Temp:      TempSrc:      SpO2: 95% 94% 95% 95%  Weight:      Height:       General exam: Alert, awake, oriented x 2; following commands appropriately.  In no acute distress. Respiratory system: Positive rhonchi; no wheezing, no crackles, no using accessory muscle.  Patient reported no orthopnea. Cardiovascular system: After cardioversion rate controlled/sinus; no rubs, no gallops, no JVD on exam. Gastrointestinal system: Abdomen is nondistended, soft and nontender. No organomegaly or masses felt. Normal bowel sounds heard. Central nervous system: No focal neurological deficits.  Moving 4 limbs spontaneously. Extremities: No cyanosis, clubbing or edema. Skin: No petechiae. Psychiatry: Judgement and insight appear normal. Mood & affect appropriate.   Data Reviewed: Basic metabolic panel: Sodium 133, potassium 3.3, chloride 95, bicarb 26, glucose 114, BUN 15, creatinine 0.75 and GFR >60 CBC: WBCs 18.1, hemoglobin 8.8 and platelet count 574K Troponin: 16  Family Communication: No family at bedside.  Disposition: Status is: Inpatient Remains inpatient appropriate because: Continue IV therapy, neurology workup and further recommendations by  cardiology service.   Planned Discharge Destination: Home  Time spent: 50 minutes  Author: Eric Nunnery, MD 04/06/2023 8:37 AM  For on call review [email or web address].

## 2023-04-06 NOTE — Consult Note (Addendum)
 I connected with  Curtis Campos on 04/06/23 by a video enabled telemedicine application and verified that I am speaking with the correct person using two identifiers.   I discussed the limitations of evaluation and management by telemedicine. The patient expressed understanding and agreed to proceed.  Location of patient: [facility] Location of physician: [facility]   Neurology Consultation Reason for Consult: Seizure Referring Physician: Dr Elsie Body  CC: Altered mental status, seizure-like activity  History is obtained from: Patient, chart review  HPI: Curtis Campos is a 83 y.o. male with history of stage II lung adenocarcinoma, history of rectal cancer status post colostomy, aortic stenosis status post bioprosthetic TAVR, coronary artery disease, hypertension, hyperlipidemia, chronic hypoxic respiratory failure on 4 L of oxygen  at home, chronic hyponatremia who was brought in with altered mental status.  While in the emergency room, initial eval patient was alert and oriented x 3 and did not know why he was in the hospital.  CT without contrast was obtained which did not show any acute abnormality.  Subsequently after around 9:30 PM, RN noticed patient had an episode of leftward gaze deviation with jerking lasting for about 30 seconds and subsequent confusion.  This was followed by another episode of generalized tonic-clonic activity with persistent left gaze deviation which was terminated with Ativan . Dr Jerrie was consulted and recommended loading with levetiracetam , obtaining MRI brain with and without contrast.   Of note, while in the emergency room patient also developed A-fib with RVR status post synchronized cardioversion x 2 and started on IV heparin .   No family at bedside.  Per RN no further seizure-like episodes noted.  ROS: All other systems reviewed and negative except as noted in the HPI.   Past Medical History:  Diagnosis Date   Agent orange  exposure    Anemia    Anxiety    Aortic atherosclerosis (HCC)    Aortic stenosis 07/04/2013   a.)TTE 07/04/13: mild (MPG 12; AVA 1.6); b.) TTE 01/01/16: mod (MPG 14; AVA 1.17); c.) TTE 08/30/17: mod (MPG 15; AVA 1.25); d.) TTE 07/13/19: mod (MPG 19; AVA 1.16); e.) TTE 07/23/20: mod (MPG 22; AVA 1.25); f.) TTE 03/12/2021: mod-sev (MPH 34; AVA 0.75); g.) TTE 05/21/2021: mod (MPG 22; AVA 0.8); h.) RHC 08/27/2020: sev (MPG 26.18; AVA 0.69); i.) s/p TAVR 11/25/21: 26 mm Edwards S3 Ultra Resilia   Arthritis 03/12/2011   Cardiomegaly    Cerebral microvascular disease    Cholelithiasis    Chronic hyponatremia    Chronic obstructive lung disease (HCC)    Colostomy in place [facility])    a.) s/p resection secondary to rectal cancer   Coronary artery disease 03/2013   a.) LHC 04/10/2013: 10% mLAD, 50% D1, 30% D2, 99% mLCx, 50% OM1, 40% pRCA, 30% mRCA, 60% RPDA, 40% RPLS - med mgmt; b.) MV 08/31/2019: basal inflat/mid inflat defect c/w ischemia in LCx distribution. EF 37%; c.) R/LHC 08/27/2020: 50% D1, 50% D2, 90% mLCx, 40% p-mRCA, 80% RPDA - med mgmt   Former smoker    HFrEF (heart failure with reduced ejection fraction) (HCC)    a.) TEE 04/13/13: EF 20-25, glob HK, G1DD, mild LAE, mod-sev MR; b.) TTE 08/30/17: EF 45-50, LVH, mild LAE, G1DD, mod AS, mod MR; c.) TTE 07/13/19: EF 30-35, glob HK, G1DD, RVSP 49.4, mild LAE, mild-mod MR, mod AS; d.) TTE 03/12/21: EF 55, G2DD, mild MR, mod-sev AS; e.) TTE 12/24/21: EF 50, mild LVH, G1DD, RVSP 40.5, mild-mod MR, triv  AR; f.) TTE 11/18/22: EF 45-50, glob HK, G1DD, mild MR   History of bilateral cataract extraction    Hyperlipidemia    Hypertension    Hypokalemia    Ischemic cardiomyopathy    a.) TTE 04/08/2014: EF 25-30%; b.) TEE 04/13/2013: EF 20-25%; c.) TTE 07/04/2013: EF 30-35%; d.) TTE 01/01/2016: EF 40-45%; e.) TTE 08/30/2017: EF 45-50%; f.) TTE 07/03/2019: EF 30-35%; g.) MV 08/31/2019: EF 37%; h.) TTE 07/23/20: EF 55-60%; i.) TTE 03/12/2021: EF 55%; j.) TTE  05/21/2021: EF 40-45%; k.) TTE 11/26/2021: EF 45-50%; l.) TTE 12/24/2021: EF 50%; m.) TTE 11/18/2022: EF 45-50%   Long term (current) use of aspirin     Mass of upper lobe of left lung 03/10/2021   a.) CTA chest 03/10/2021: 6.1 mm; b.) CTA chest 10/15/2021: 8 mm; c.) PET CT 11/14/2021: 8 mm (SUV max 2.8); d.) CXR 10/05/2022: interval increase to 2.7 cm; e.) CXR 01/18/2023: interval increase to 3.7 cm; f.) CT chest 01/22/2023: 3.8 x 3.2 cm; g.) PET CT 02/01/2023: 3.8 cm and markedly hypermetabolic with SUV max of 43   Multifocal atrial tachycardia (HCC)    Myocardial infarction [facility])    a.) suspected by cardiology during 03/2013 admission for CP/CAP/new onset CHF --> TTE  showed severe LV dysfunction with segmental wall motion abnormalities and severe MR (posteriorly-directed  jet)   Pneumonia    Pre-diabetes    Pulmonary hypertension (HCC)    a.) TTE 04/08/2013: RVSP 62.2; b.) TTE 07/13/2019: RVSP 49.4; c.) TTE 07/23/2020: RVSP 33.2; d.) TTE 11/26/2021: RVSP 41.2; e.) TTE 12/24/2021: RVSP 40.5; f.) TTE 11/18/2019: RVSP 29.8   Rectal cancer (HCC)    Requires continuous at home supplemental oxygen     a.) 4L/[place]   S/P TAVR (transcatheter aortic valve replacement) 11/25/2021   a.) 26mm Edwards Sapien 3 Ultra Resilia via the TF approach   Statin intolerance    Umbilical hernia (s/p repair)     Family History  Problem Relation Age of Onset   Heart disease Mother    Heart disease Father    Hyperlipidemia Father    Hypertension Father    Heart disease Sister        stents placed    Cancer - Cervical Sister    Heart disease Sister        CABG   Heart attack Brother 62       MI    Social History:  reports that he quit smoking about 10 years ago. His smoking use included cigarettes. He started smoking about 60 years ago. He has a 50 pack-year smoking history. He has never used smokeless tobacco. He reports that he does not drink alcohol  and does not use drugs.   Exam: Current vital  signs: BP 118/85   Pulse 93   Temp 97.6 F (36.4 C) (Oral)   Resp (!) 21   Ht 5' 6 (1.676 m)   Wt 75.3 kg   SpO2 95%   BMI 26.79 kg/m  Vital signs in last 24 hours: Temp:  [97.6 F (36.4 C)-97.7 F (36.5 C)] 97.6 F (36.4 C) (01/07 0729) Pulse Rate:  [77-148] 93 (01/07 0815) Resp:  [12-24] 21 (01/07 0815) BP: (92-156)/(53-101) 118/85 (01/07 0815) SpO2:  [82 %-99 %] 95 % (01/07 0815) Weight:  [75.3 kg-75.5 kg] 75.3 kg (01/07 0430)   Physical Exam  Constitutional: Appears well-developed and well-nourished.  Neuro: Alert oriented to place and person, time: December, follows commands, is dysarthric but unclear if this is new  or is it  baseline due to being edentulous, cranial nerves appear grossly intact, antigravity strength in bilateral upper extremities with left upper extremity drift (per patient this is baseline due to chronic pain), antigravity strength in left lower extremity, some movement in right lower extremity but did not demonstrate antigravity strength.  Again patient states his right leg is weak at baseline as it frequently cracks   I have reviewed labs in epic and the results pertinent to this consultation are: CBC:  Recent Labs  Lab 04/05/23 1902 04/06/23 0448  WBC 18.6* 18.1*  HGB 8.8* 8.8*  HCT 29.0* 27.7*  MCV 86.3 86.0  PLT 574* 574*    Basic Metabolic Panel:  Lab Results  Component Value Date   NA 133 (L) 04/06/2023   K 3.3 (L) 04/06/2023   CO2 26 04/06/2023   GLUCOSE 114 (H) 04/06/2023   BUN 15 04/06/2023   CREATININE 0.75 04/06/2023   CALCIUM 8.7 (L) 04/06/2023   GFRNONAA >60 04/06/2023   GFRAA 79 04/04/2020   Lipid Panel:  Lab Results  Component Value Date   LDLCALC 106 (H) 02/12/2020   HgbA1c:  Lab Results  Component Value Date   HGBA1C 6.3 02/12/2020   Urine Drug Screen: No results found for: LABOPIA, COCAINSCRNUR, LABBENZ, AMPHETMU, THCU, LABBARB  Alcohol  Level No results found for: ETH   I have reviewed the  images obtained:  CT Head without contrast 04/05/2023: . No acute intracranial process. Stable atrophy and chronic small vessel ischemic changes.  MRI brain with and without contrast 04/06/2023:  No acute or reversible finding. No evidence of metastatic disease. Advanced chronic small-vessel ischemic changes of the pons and cerebral hemispheric white matter. Chronic susceptibility artifact in the right occipital lobe previously felt  secondary to a small cavernoma and developmental venous anomaly. No change in this appearance.     ASSESSMENT/PLAN: 83 year old male with stage II lung adenocarcinoma plans for radiation who presented with altered mental status and proceeded to have focal seizures which then generalized.   New onset epilepsy Cavernoma in right occipital region Stage II lung adenocarcinoma Leukocytosis -No clear provoking factors.  Patient does have a cavernoma and the semiology included left gaze deviation.      Recommendations: -Recommend Keppra  500 mg twice daily -Routine EEG is ordered and pending -Continue seizure precautions -As needed IV Ativan  for clinical seizures -Ideally would consider lumbar puncture to rule out leptomeningeal spread.  However patient is also on IV heparin  due to A-fib with RVR.  In the setting of negative MRI brain with and without contrast, discussed with Dr. Ricky to curbside patient's oncologist whether holding IV heparin  for lumbar puncture would be helpful.   -Patient is on the list for transfer to [facility].  Please notify neurology team once patient arrives -Once patient arrives to [facility], please place order for fluoroscopy guided lumbar puncture to check for leptomeningeal spread and hold IV heparin  drip accordingly -Discussed plan with Dr. Ricky via secure chat  Thank you for allowing us  to participate in the care of this patient. If you have any further questions, please contact  me or neurohospitalist.   Arlin Krebs Epile[facility] neurohospitalist

## 2023-04-06 NOTE — ED Notes (Signed)
 Attending gave verbal order to remove all drips and pads for MRI. He gave verbal order to remove venturi mask and replace with [place] at 4 L. Pt has left with Mri. Attending stated pt was stable for transport and would like the pt taken to [facility] for a progressive bed.

## 2023-04-06 NOTE — Progress Notes (Signed)
 Placed patient on 100% nrb due to Sp02 levels.

## 2023-04-06 NOTE — Progress Notes (Signed)
 Patient Sp02 increased to 94-96% on 100% NRB. Will attempt to wean fio2

## 2023-04-06 NOTE — Progress Notes (Signed)
 Was called to patient's room concerning oxygen  sats not maintaining.  Patient is a mouth breather and staff was having a hard time keeping sats up.  Placed patient on Venturi mask and changed probe to ear.  Sats were maintaining at 6L but had to put patient back up to 8L.  Sats still fluctuating some but patient is currently at 90 to 92%.  BS were clear on right side but diminished on left.  Patient has a mass on left side of lung and was supposed to start treatment for this.  Patient is currently tachycardic.

## 2023-04-06 NOTE — ED Provider Notes (Signed)
 Patient's heart rhythm changed to atrial fibrillation with rapid ventricular response and I was consulted to perform procedural sedation and cardioversion.  Cardiac monitor clearly shows atrial fibrillation with rapid ventricular response.  He is now awake and clearly does need sedation.  He was sedated with propofol  and successfully cardioverted, but then did revert to atrial fibrillation and had to be cardioverted a second time.  Sedation was complicated by hypoxia which was treated with assisted respirations using bag-valve-mask  He did return to his baseline mental status.  .Sedation  Date/Time: 04/06/2023 5:47 AM  Performed by: Raford Lenis, MD Authorized by: Raford Lenis, MD   Consent:    Consent obtained:  Verbal   Consent given by:  Patient   Risks discussed:  Allergic reaction, dysrhythmia, inadequate sedation, nausea, prolonged hypoxia resulting in organ damage, prolonged sedation necessitating reversal, respiratory compromise necessitating ventilatory assistance and intubation and vomiting   Alternatives discussed:  Analgesia without sedation, anxiolysis and regional anesthesia Universal protocol:    Procedure explained and questions answered to patient or proxy's satisfaction: yes     Relevant documents present and verified: yes     Test results available: yes     Imaging studies available: yes     Required blood products, implants, devices, and special equipment available: yes     Site/side marked: yes     Immediately prior to procedure, a time out was called: yes     Patient identity confirmed:  Verbally with patient and arm band Indications:    Procedure performed:  Cardioversion   Procedure necessitating sedation performed by:  Physician performing sedation Pre-sedation assessment:    Time since last food or drink:  10 hours   ASA classification: class 3 - patient with severe systemic disease     Mouth opening:  2 finger widths   Thyromental distance:  4 finger widths    Mallampati score:  IV - only hard palate visible   Neck mobility: reduced     Pre-sedation assessments completed and reviewed: airway patency, cardiovascular function, hydration status, mental status, nausea/vomiting, pain level, respiratory function and temperature   A pre-sedation assessment was completed prior to the start of the procedure Immediate pre-procedure details:    Reassessment: Patient reassessed immediately prior to procedure     Reviewed: vital signs, relevant labs/tests and NPO status     Verified: bag valve mask available, emergency equipment available, intubation equipment available, IV patency confirmed, oxygen  available and suction available   Procedure details (see MAR for exact dosages):    Preoxygenation:  Nasal cannula   Sedation:  Propofol    Intended level of sedation: deep   Intra-procedure monitoring:  Blood pressure monitoring, cardiac monitor, continuous pulse oximetry, frequent LOC assessments, frequent vital sign checks and continuous capnometry   Intra-procedure events: hypoxia     Intra-procedure management:  BVM ventilation   Total Provider sedation time (minutes):  30 Post-procedure details:   A post-sedation assessment was completed following the completion of the procedure.   Attendance: Constant attendance by certified staff until patient recovered     Recovery: Patient returned to pre-procedure baseline     Post-sedation assessments completed and reviewed: airway patency, cardiovascular function, hydration status, mental status, nausea/vomiting, pain level, respiratory function and temperature     Patient is stable for discharge or admission: yes     Procedure completion:  Tolerated well, no immediate complications .Cardioversion  Date/Time: 04/06/2023 5:48 AM  Performed by: Raford Lenis, MD Authorized by: Raford Lenis, MD  Consent:    Consent obtained:  Written   Consent given by:  Patient   Risks discussed:  Induced arrhythmia, death and pain    Alternatives discussed:  Rate-control medication Universal protocol:    Procedure explained and questions answered to patient or proxy's satisfaction: yes     Relevant documents present and verified: yes     Test results available and properly labeled: yes     Imaging studies available: yes     Required blood products, implants, devices, and special equipment available: yes     Site/side marked: yes     Immediately prior to procedure a time out was called: yes     Patient identity confirmed:  Verbally with patient and arm band Pre-procedure details:    Cardioversion basis:  Emergent   Rhythm:  Atrial fibrillation   Electrode placement:  Anterior-posterior Patient sedated: Yes. Refer to sedation procedure documentation for details of sedation.  Attempt one:    Cardioversion mode:  Synchronous   Waveform:  Biphasic   Shock (Joules):  120   Shock outcome:  Conversion to normal sinus rhythm Attempt two:    Cardioversion mode:  Synchronous   Waveform:  Biphasic   Shock (Joules):  120   Shock outcome:  Conversion to normal sinus rhythm Post-procedure details:    Patient status:  Awake   Patient tolerance of procedure:  Tolerated well, no immediate complications Comments:     After first cardioversion, he was in sinus rhythm but reverted to atrial fibrillation, cardioverted a second time with stable rhythm.  Episode of hypoxia treated with bag-valve-mask assisted respirations.      Raford Lenis, MD 04/06/23 [id number]

## 2023-04-06 NOTE — Progress Notes (Signed)
 Placed patient on 10lpm salter cannula to keep Sp02 greater than 90%

## 2023-04-06 NOTE — Procedures (Addendum)
 Patient Name: Curtis Campos  MRN: [id number]  Epilepsy Attending: Arlin MALVA Krebs  Referring Physician/Provider: Keturah Carrier, MD  Date: 04/06/2023 Duration: 22.10 mins  Patient history: 83 year old male with stage II lung adenocarcinoma plans for radiation who presented with altered mental status and proceeded to have focal seizures which then generalized. EEG to evaluate for seizure   Level of alertness: Awake, asleep  AEDs during EEG study: LEV, Ativan   Technical aspects: This EEG study was done with scalp electrodes positioned according to the 10-20 International system of electrode placement. Electrical activity was reviewed with band pass filter of 1-70Hz , sensitivity of 7 uV/mm, display speed of 23mm/sec with a 60Hz  notched filter applied as appropriate. EEG data were recorded continuously and digitally stored.  Video monitoring was available and reviewed as appropriate.  Description: No clear posterior dominant rhythm was seen. Sleep was characterized by sleep spindles (12 to 14 Hz), maximal frontocentral region. EEG showed 3-5hz  theta-delta slowing with overriding 13 to 15 Hz beta activity. Sharp waves were noted in the hemisphere, maximal left frontal temporal region, at times quasi periodic at 1 Hz.  Hyperventilation and photic stimulation were not performed.     ABNORMALITY - Sharp waves, left hemisphere, maximal left frontotemporal region - Continuous slow, generalized and lateralized left hemisphere  IMPRESSION: This study showed evidence of epileptogenicity and cortical dysfunction arising from left hemisphere, maximal in frontotemporal region.  Additionally there is moderate diffuse encephalopathy.  No definite seizures were seen during this time.  Recommend long-term EEG for further evaluation.  Donnita Farina O Kynedi Profitt

## 2023-04-06 NOTE — Plan of Care (Signed)
 Per EDP notes and as discussed with me by Dr. Francesca at ~10 PM, Curtis Campos is a 83 y.o. male history of aortic stenosis status post TAVR, rectal cancer status post colostomy, coronary artery disease, CHF, hypertension, hyperlipidemia, lung mass, on chronic home oxygen  presenting to the hospital with altered mental status.  Patient brought in by family, they report that today he seemed a little off, they plan to bring him into the hospital for some mild confusion.  On the way, patient had episode of stiffening up, altered mental status and unresponsiveness.  On EMS arrival the patient was improved, on ER arrival patient reports he does not have any complaints, mild headache, does not remember this episode earlier.  No recent fevers or chills, chest pain, abdominal pain, urinary symptoms, back pain, numbness or tingling, weakness.    Subsequently was noted by staff to have episode of leftward gaze deviation, lasting around 30 seconds. Afterwards was able to state his name but seemed confused. Subsequent to this, was witnessed also have a further episode of tonic-clonic activity with persistent left gaze deviation which was terminated by Ativan . Suspicious for seizure. No ongoing gaze deviation. He remains somnolent but has improved. Patient loaded with Keppra . Seems consistent with new onset seizure. Patient has no history of seizures.   Head CT personally reviewed, agree with radiology:  no acute intracranial process    Basic Metabolic Panel: Recent Labs  Lab 04/05/23 1902  NA 131*  K 3.4*  CL 93*  CO2 27  GLUCOSE 166*  BUN 14  CREATININE 0.77  CALCIUM 8.7*  MG 2.1   Lab Results  Component Value Date   ALT 12 04/05/2023   AST 13 (L) 04/05/2023   ALKPHOS 127 (H) 04/05/2023   BILITOT 0.6 04/05/2023    CBC: Recent Labs  Lab 04/05/23 1902  WBC 18.6*  HGB 8.8*  HCT 29.0*  MCV 86.3  PLT 574*    Coagulation Studies: Recent Labs    04/05/23 1902  LABPROT 15.4*  INR 1.2     UA neg, CXR no acute process, Flu/Covid/RSV neg  On re-evaluation by Dr. Raford -- still very somnolent, but awakens, oriented to Zelda Salmon and his age, dysarthric speech.   Recommendations   1) Due to malignancy history fairly likely to need LP for flow/cyto to evaluate for leptomeningeal diease (defer final decision to full neurology evaluation). This would necessitate admission to [facility]   2) Routine EEG   3) Recent MRI brain w/ and w/o negative, would repeat this study due to new symptoms   4) s/p Keppra  1500 mg load, would start 500 mg BID. Adjust as needed for renal function:  Estimated Creatinine Clearance: 64.2 mL/min (by C-G formula based on SCr of 0.77 mg/dL).   CrCl 80 to 130 mL/minute/1.73 m2: 500 mg to 1.5 g every 12 hours.  CrCl 50 to <80 mL/minute/1.73 m2: 500 mg to 1 g every 12 hours.  CrCl 30 to <50 mL/minute/1.73 m2: 250 to 750 mg every 12 hours.  CrCl 15 to <30 mL/minute/1.73 m2: 250 to 500 mg every 12 hours.  CrCl <15 mL/minute/1.73 m2: 250 to 500 mg every 24 hours (expert opinion).  5) Check CK and trend to peak   6) Notify neurology on arrival to [facility] for full consult. If mental status worsening or further episodes of left gaze deviation, tonic colonic shaking or other concern for seizure activity may need urgent ED to ED transfer but at this time given mental  status improving and no clinical seizures do not see a need for emergent transfer.

## 2023-04-06 NOTE — Progress Notes (Signed)
 PHARMACY - ANTICOAGULATION CONSULT NOTE  Pharmacy Consult for IV heparin  Indication: atrial fibrillation  Allergies  Allergen Reactions   Codeine Nausea Only   Statins Other (See Comments)    REACTION: myalgias    Patient Measurements: Weight: 75.3 kg (166 lb 1.6 oz) Heparin  Dosing Weight: 75.3 kg  Vital Signs: Temp: 97.7 F (36.5 C) (01/06 2234) Temp Source: Axillary (01/06 2234) BP: 102/78 (01/07 0430) Pulse Rate: 142 (01/07 0430)  Labs: Recent Labs    04/05/23 1902 04/05/23 2049  HGB 8.8*  --   HCT 29.0*  --   PLT 574*  --   LABPROT 15.4*  --   INR 1.2  --   CREATININE 0.77  --   CKTOTAL  --  46*    Estimated Creatinine Clearance: 64.2 mL/min (by C-G formula based on SCr of 0.77 mg/dL).   Medical History: Past Medical History:  Diagnosis Date   Agent orange exposure    Anemia    Anxiety    Aortic atherosclerosis (HCC)    Aortic stenosis 07/04/2013   a.)TTE 07/04/13: mild (MPG 12; AVA 1.6); b.) TTE 01/01/16: mod (MPG 14; AVA 1.17); c.) TTE 08/30/17: mod (MPG 15; AVA 1.25); d.) TTE 07/13/19: mod (MPG 19; AVA 1.16); e.) TTE 07/23/20: mod (MPG 22; AVA 1.25); f.) TTE 03/12/2021: mod-sev (MPH 34; AVA 0.75); g.) TTE 05/21/2021: mod (MPG 22; AVA 0.8); h.) RHC 08/27/2020: sev (MPG 26.18; AVA 0.69); i.) s/p TAVR 11/25/21: 26 mm Edwards S3 Ultra Resilia   Arthritis 03/12/2011   Cardiomegaly    Cerebral microvascular disease    Cholelithiasis    Chronic hyponatremia    Chronic obstructive lung disease (HCC)    Colostomy in place [facility])    a.) s/p resection secondary to rectal cancer   Coronary artery disease 03/2013   a.) LHC 04/10/2013: 10% mLAD, 50% D1, 30% D2, 99% mLCx, 50% OM1, 40% pRCA, 30% mRCA, 60% RPDA, 40% RPLS - med mgmt; b.) MV 08/31/2019: basal inflat/mid inflat defect c/w ischemia in LCx distribution. EF 37%; c.) R/LHC 08/27/2020: 50% D1, 50% D2, 90% mLCx, 40% p-mRCA, 80% RPDA - med mgmt   Former smoker    HFrEF (heart failure with reduced ejection  fraction) (HCC)    a.) TEE 04/13/13: EF 20-25, glob HK, G1DD, mild LAE, mod-sev MR; b.) TTE 08/30/17: EF 45-50, LVH, mild LAE, G1DD, mod AS, mod MR; c.) TTE 07/13/19: EF 30-35, glob HK, G1DD, RVSP 49.4, mild LAE, mild-mod MR, mod AS; d.) TTE 03/12/21: EF 55, G2DD, mild MR, mod-sev AS; e.) TTE 12/24/21: EF 50, mild LVH, G1DD, RVSP 40.5, mild-mod MR, triv AR; f.) TTE 11/18/22: EF 45-50, glob HK, G1DD, mild MR   History of bilateral cataract extraction    Hyperlipidemia    Hypertension    Hypokalemia    Ischemic cardiomyopathy    a.) TTE 04/08/2014: EF 25-30%; b.) TEE 04/13/2013: EF 20-25%; c.) TTE 07/04/2013: EF 30-35%; d.) TTE 01/01/2016: EF 40-45%; e.) TTE 08/30/2017: EF 45-50%; f.) TTE 07/03/2019: EF 30-35%; g.) MV 08/31/2019: EF 37%; h.) TTE 07/23/20: EF 55-60%; i.) TTE 03/12/2021: EF 55%; j.) TTE 05/21/2021: EF 40-45%; k.) TTE 11/26/2021: EF 45-50%; l.) TTE 12/24/2021: EF 50%; m.) TTE 11/18/2022: EF 45-50%   Long term (current) use of aspirin     Mass of upper lobe of left lung 03/10/2021   a.) CTA chest 03/10/2021: 6.1 mm; b.) CTA chest 10/15/2021: 8 mm; c.) PET CT 11/14/2021: 8 mm (SUV max 2.8); d.) CXR 10/05/2022: interval increase to 2.7  cm; e.) CXR 01/18/2023: interval increase to 3.7 cm; f.) CT chest 01/22/2023: 3.8 x 3.2 cm; g.) PET CT 02/01/2023: 3.8 cm and markedly hypermetabolic with SUV max of 43   Multifocal atrial tachycardia (HCC)    Myocardial infarction [facility])    a.) suspected by cardiology during 03/2013 admission for CP/CAP/new onset CHF --> TTE  showed severe LV dysfunction with segmental wall motion abnormalities and severe MR (posteriorly-directed  jet)   Pneumonia    Pre-diabetes    Pulmonary hypertension (HCC)    a.) TTE 04/08/2013: RVSP 62.2; b.) TTE 07/13/2019: RVSP 49.4; c.) TTE 07/23/2020: RVSP 33.2; d.) TTE 11/26/2021: RVSP 41.2; e.) TTE 12/24/2021: RVSP 40.5; f.) TTE 11/18/2019: RVSP 29.8   Rectal cancer (HCC)    Requires continuous at home supplemental oxygen     a.) 4L/[place]    S/P TAVR (transcatheter aortic valve replacement) 11/25/2021   a.) 26mm Edwards Sapien 3 Ultra Resilia via the TF approach   Statin intolerance    Umbilical hernia (s/p repair)    Assessment: Curtis Campos is a 83 y.o. year old male admitted on 04/05/2023 with concern for new afib RVR. No anticoagulation prior to admission. Pharmacy consulted to dose heparin .   Goal of Therapy:  Heparin  level 0.3-0.7 units/ml Monitor platelets by anticoagulation protocol: Yes   Plan:  Heparin  4000 units x 1 as bolus followed by heparin  infusion at 1000 units/hr 8 heparin  level  Daily heparin  level, CBC, and monitoring for bleeding F/u plans for anticoagulation   Thank you for allowing pharmacy to participate in this patient's care.  Leonor GORMAN Bash, PharmD Emergency Medicine Clinical Pharmacist 04/06/2023,4:50 AM

## 2023-04-06 NOTE — ED Notes (Signed)
 Dr. Alvan and PA gave order to [facility] pt from all drips and pads for MRI. RN expressed concern for pt going off of medication. PA stated it would be fine for him to go without medications ordered and to come off pads. PA also feels pt is stable enough from cardio stand point to be transferred to [facility] when a bed is available.

## 2023-04-06 NOTE — Progress Notes (Signed)
 PHARMACY - ANTICOAGULATION CONSULT NOTE  Pharmacy Consult for IV heparin  Indication: atrial fibrillation  Allergies  Allergen Reactions   Codeine Nausea Only   Statins Other (See Comments)    REACTION: myalgias    Patient Measurements: Height: 5' 6 (167.6 cm) Weight: 75.3 kg (166 lb 0.1 oz) IBW/kg (Calculated) : 63.8 Heparin  Dosing Weight: 75.3 kg  Vital Signs: Temp: 97.6 F (36.4 C) (01/07 0729) Temp Source: Oral (01/07 0729) BP: 118/85 (01/07 0815) Pulse Rate: 93 (01/07 0815)  Labs: Recent Labs    04/05/23 1902 04/05/23 2049 04/06/23 0448  HGB 8.8*  --  8.8*  HCT 29.0*  --  27.7*  PLT 574*  --  574*  LABPROT 15.4*  --   --   INR 1.2  --   --   CREATININE 0.77  --  0.75  CKTOTAL  --  46*  --   TROPONINIHS  --   --  16    Estimated Creatinine Clearance: 64.2 mL/min (by C-G formula based on SCr of 0.75 mg/dL).   Medical History: Past Medical History:  Diagnosis Date   Agent orange exposure    Anemia    Anxiety    Aortic atherosclerosis (HCC)    Aortic stenosis 07/04/2013   a.)TTE 07/04/13: mild (MPG 12; AVA 1.6); b.) TTE 01/01/16: mod (MPG 14; AVA 1.17); c.) TTE 08/30/17: mod (MPG 15; AVA 1.25); d.) TTE 07/13/19: mod (MPG 19; AVA 1.16); e.) TTE 07/23/20: mod (MPG 22; AVA 1.25); f.) TTE 03/12/2021: mod-sev (MPH 34; AVA 0.75); g.) TTE 05/21/2021: mod (MPG 22; AVA 0.8); h.) RHC 08/27/2020: sev (MPG 26.18; AVA 0.69); i.) s/p TAVR 11/25/21: 26 mm Edwards S3 Ultra Resilia   Arthritis 03/12/2011   Cardiomegaly    Cerebral microvascular disease    Cholelithiasis    Chronic hyponatremia    Chronic obstructive lung disease (HCC)    Colostomy in place [facility])    a.) s/p resection secondary to rectal cancer   Coronary artery disease 03/2013   a.) LHC 04/10/2013: 10% mLAD, 50% D1, 30% D2, 99% mLCx, 50% OM1, 40% pRCA, 30% mRCA, 60% RPDA, 40% RPLS - med mgmt; b.) MV 08/31/2019: basal inflat/mid inflat defect c/w ischemia in LCx distribution. EF 37%; c.) R/LHC 08/27/2020:  50% D1, 50% D2, 90% mLCx, 40% p-mRCA, 80% RPDA - med mgmt   Former smoker    HFrEF (heart failure with reduced ejection fraction) (HCC)    a.) TEE 04/13/13: EF 20-25, glob HK, G1DD, mild LAE, mod-sev MR; b.) TTE 08/30/17: EF 45-50, LVH, mild LAE, G1DD, mod AS, mod MR; c.) TTE 07/13/19: EF 30-35, glob HK, G1DD, RVSP 49.4, mild LAE, mild-mod MR, mod AS; d.) TTE 03/12/21: EF 55, G2DD, mild MR, mod-sev AS; e.) TTE 12/24/21: EF 50, mild LVH, G1DD, RVSP 40.5, mild-mod MR, triv AR; f.) TTE 11/18/22: EF 45-50, glob HK, G1DD, mild MR   History of bilateral cataract extraction    Hyperlipidemia    Hypertension    Hypokalemia    Ischemic cardiomyopathy    a.) TTE 04/08/2014: EF 25-30%; b.) TEE 04/13/2013: EF 20-25%; c.) TTE 07/04/2013: EF 30-35%; d.) TTE 01/01/2016: EF 40-45%; e.) TTE 08/30/2017: EF 45-50%; f.) TTE 07/03/2019: EF 30-35%; g.) MV 08/31/2019: EF 37%; h.) TTE 07/23/20: EF 55-60%; i.) TTE 03/12/2021: EF 55%; j.) TTE 05/21/2021: EF 40-45%; k.) TTE 11/26/2021: EF 45-50%; l.) TTE 12/24/2021: EF 50%; m.) TTE 11/18/2022: EF 45-50%   Long term (current) use of aspirin     Mass of upper lobe of  left lung 03/10/2021   a.) CTA chest 03/10/2021: 6.1 mm; b.) CTA chest 10/15/2021: 8 mm; c.) PET CT 11/14/2021: 8 mm (SUV max 2.8); d.) CXR 10/05/2022: interval increase to 2.7 cm; e.) CXR 01/18/2023: interval increase to 3.7 cm; f.) CT chest 01/22/2023: 3.8 x 3.2 cm; g.) PET CT 02/01/2023: 3.8 cm and markedly hypermetabolic with SUV max of 43   Multifocal atrial tachycardia (HCC)    Myocardial infarction (HCC)    a.) suspected by cardiology during 03/2013 admission for CP/CAP/new onset CHF --> TTE  showed severe LV dysfunction with segmental wall motion abnormalities and severe MR (posteriorly-directed  jet)   Pneumonia    Pre-diabetes    Pulmonary hypertension (HCC)    a.) TTE 04/08/2013: RVSP 62.2; b.) TTE 07/13/2019: RVSP 49.4; c.) TTE 07/23/2020: RVSP 33.2; d.) TTE 11/26/2021: RVSP 41.2; e.) TTE 12/24/2021: RVSP  40.5; f.) TTE 11/18/2019: RVSP 29.8   Rectal cancer (HCC)    Requires continuous at home supplemental oxygen     a.) 4L/[place]   S/P TAVR (transcatheter aortic valve replacement) 11/25/2021   a.) 26mm Edwards Sapien 3 Ultra Resilia via the TF approach   Statin intolerance    Umbilical hernia (s/p repair)    Assessment: Curtis Campos is a 83 y.o. year old male admitted on 04/05/2023 with concern for new afib RVR. No anticoagulation prior to admission. Pharmacy consulted to dose heparin .  Heparin  level undetectable. Infusion may have been off for to an hour for line placement and procedures. Will adjust rate. No bleeding issues noted.    Goal of Therapy:  Heparin  level 0.3-0.7 units/ml Monitor platelets by anticoagulation protocol: Yes   Plan:  Rebolus heparin  4000 units then increase rate to 1200 units/hr Check 8 hour heparin  level Daily heparin  level, CBC, and monitoring for bleeding F/u plans for anticoagulation   Thank you for allowing pharmacy to participate in this patient's care.  Dempsey Blush PharmD., BCPS Clinical Pharmacist 04/06/2023 8:36 AM

## 2023-04-06 NOTE — Progress Notes (Signed)
 EEG complete - results pending

## 2023-04-06 NOTE — H&P (Addendum)
 History and Physical    Curtis Campos FMW:[id number] DOB: 24-Mar-1941 DOA: 04/05/2023  PCP: [name], MD   Patient coming from: Home   Chief Complaint:  Chief Complaint  Patient presents with   Altered Mental Status    HPI:  Curtis Campos is a 83 y.o. male with hx of stage II lung adenocarcinoma currently planned for upcoming radiation therapy, remote history of rectal cancer status post colostomy, aortic stenosis status post bioprosthetic TAVR coronary artery disease, heart failure with midrange ejection fraction, hypertension, hyperlipidemia, COPD, chronic hypoxic respiratory failure on 4 L O2, chronic hyponatremia, who was brought in from home due to altered mental status followed by what appeared to be possible seizure-like episode.  History is provided by patient's son at the bedside.  Reports that initially patient's wife had noted around 3 AM on 1/6 that he appeared confused and was not making sense.  He had gone back to bed and then later in the afternoon his son saw him and noted that he was confused, unable to answer questions.  When transporting him to the ED in his car the patient seemed to stiffen up and son was worried possibly it was a stroke.  He called EMS who then transported.  In the emergency department he had a witnessed generalized tonic-clonic seizure which was aborted with Ativan  2 mg.  At time of my interview patient awakens briefly and can answer yes or no question.  Please see ED course and significant event note for additional details about his ED treatment   Review of Systems:  ROS complete and negative except as marked above   Allergies  Allergen Reactions   Codeine Nausea Only   Statins Other (See Comments)    REACTION: myalgias    Prior to Admission medications   Medication Sig Start Date End Date Taking? Authorizing Provider  acetaminophen  (TYLENOL ) 500 MG tablet Take 500 mg by mouth at bedtime.   Yes [provider]  albuterol   (PROVENTIL ) (2.5 MG/3ML) 0.083% nebulizer solution Take 2.5 mg by nebulization at bedtime. 10/13/21  Yes [provider]  albuterol  (VENTOLIN  HFA) 108 (90 Base) MCG/ACT inhaler INHALE 2 PUFFS BY MOUTH EVERY 6 HOURS AS NEEDED FOR SHORTNESS OF BREATH Patient taking differently: 1-2 puffs every 6 (six) hours as needed. INHALE 2 PUFFS BY MOUTH EVERY 6 HOURS AS NEEDED FOR SHORTNESS OF BREATH 08/29/18  Yes Clark, Katherine K, NP  Alirocumab  (PRALUENT ) 75 MG/ML SOAJ Inject 1 pen into the skin every 14 (fourteen) days. 08/25/18  Yes Darron Deatrice LABOR, MD  aspirin  81 MG tablet Take 81 mg by mouth at bedtime.   Yes [provider]  carvedilol  (COREG ) 6.25 MG tablet Take 3.125 mg by mouth 2 (two) times daily with a meal.   Yes [provider]  cholecalciferol  (VITAMIN D3) 25 MCG (1000 UNIT) tablet Take 1,000 Units by mouth daily.   Yes [provider]  dapagliflozin  propanediol (FARXIGA ) 10 MG TABS tablet TAKE ONE TABLET BY MOUTH EVERY MORNING BEFORE BREAKFAST 02/24/23  Yes Darron Deatrice LABOR, MD  diclofenac Sodium (VOLTAREN) 1 % GEL 2 g as needed. 04/08/21  Yes [provider]  fluticasone -salmeterol (ADVAIR) 250-50 MCG/ACT AEPB Inhale 1 puff into the lungs in the morning and at bedtime. 08/31/22  Yes [provider]  Magnesium  Oxide 400 (240 Mg) MG TABS TAKE 1 TABLET BY MOUTH ONCE DAILY. Patient taking differently: Take 400 mg by mouth daily. 05/03/17  Yes Darron Deatrice LABOR, MD  OXYGEN   Inhale 4 L into the lungs continuous.   Yes [provider]  sacubitril -valsartan  (ENTRESTO ) 49-51 MG Take half a tablet twice daily 02/23/23  Yes Darron Deatrice LABOR, MD  sodium chloride  1 g tablet Take 1 g by mouth daily with lunch. 1/2 tab 01/20/23 01/20/24 Yes [provider]  torsemide  (DEMADEX ) 20 MG tablet Take 20 mg by mouth daily.   Yes [provider]  traZODone  (DESYREL ) 50 MG tablet Take 1 tablet (50 mg total) by mouth at bedtime as needed. for  sleep 08/29/18  Yes Gretta Comer POUR, NP    Past Medical History:  Diagnosis Date   Agent orange exposure    Anemia    Anxiety    Aortic atherosclerosis (HCC)    Aortic stenosis 07/04/2013   a.)TTE 07/04/13: mild (MPG 12; AVA 1.6); b.) TTE 01/01/16: mod (MPG 14; AVA 1.17); c.) TTE 08/30/17: mod (MPG 15; AVA 1.25); d.) TTE 07/13/19: mod (MPG 19; AVA 1.16); e.) TTE 07/23/20: mod (MPG 22; AVA 1.25); f.) TTE 03/12/2021: mod-sev (MPH 34; AVA 0.75); g.) TTE 05/21/2021: mod (MPG 22; AVA 0.8); h.) RHC 08/27/2020: sev (MPG 26.18; AVA 0.69); i.) s/p TAVR 11/25/21: 26 mm Edwards S3 Ultra Resilia   Arthritis 03/12/2011   Cardiomegaly    Cerebral microvascular disease    Cholelithiasis    Chronic hyponatremia    Chronic obstructive lung disease (HCC)    Colostomy in place [facility])    a.) s/p resection secondary to rectal cancer   Coronary artery disease 03/2013   a.) LHC 04/10/2013: 10% mLAD, 50% D1, 30% D2, 99% mLCx, 50% OM1, 40% pRCA, 30% mRCA, 60% RPDA, 40% RPLS - med mgmt; b.) MV 08/31/2019: basal inflat/mid inflat defect c/w ischemia in LCx distribution. EF 37%; c.) R/LHC 08/27/2020: 50% D1, 50% D2, 90% mLCx, 40% p-mRCA, 80% RPDA - med mgmt   Former smoker    HFrEF (heart failure with reduced ejection fraction) (HCC)    a.) TEE 04/13/13: EF 20-25, glob HK, G1DD, mild LAE, mod-sev MR; b.) TTE 08/30/17: EF 45-50, LVH, mild LAE, G1DD, mod AS, mod MR; c.) TTE 07/13/19: EF 30-35, glob HK, G1DD, RVSP 49.4, mild LAE, mild-mod MR, mod AS; d.) TTE 03/12/21: EF 55, G2DD, mild MR, mod-sev AS; e.) TTE 12/24/21: EF 50, mild LVH, G1DD, RVSP 40.5, mild-mod MR, triv AR; f.) TTE 11/18/22: EF 45-50, glob HK, G1DD, mild MR   History of bilateral cataract extraction    Hyperlipidemia    Hypertension    Hypokalemia    Ischemic cardiomyopathy    a.) TTE 04/08/2014: EF 25-30%; b.) TEE 04/13/2013: EF 20-25%; c.) TTE 07/04/2013: EF 30-35%; d.) TTE 01/01/2016: EF 40-45%; e.) TTE 08/30/2017: EF 45-50%; f.) TTE 07/03/2019: EF  30-35%; g.) MV 08/31/2019: EF 37%; h.) TTE 07/23/20: EF 55-60%; i.) TTE 03/12/2021: EF 55%; j.) TTE 05/21/2021: EF 40-45%; k.) TTE 11/26/2021: EF 45-50%; l.) TTE 12/24/2021: EF 50%; m.) TTE 11/18/2022: EF 45-50%   Long term (current) use of aspirin     Mass of upper lobe of left lung 03/10/2021   a.) CTA chest 03/10/2021: 6.1 mm; b.) CTA chest 10/15/2021: 8 mm; c.) PET CT 11/14/2021: 8 mm (SUV max 2.8); d.) CXR 10/05/2022: interval increase to 2.7 cm; e.) CXR 01/18/2023: interval increase to 3.7 cm; f.) CT chest 01/22/2023: 3.8 x 3.2 cm; g.) PET CT 02/01/2023: 3.8 cm and markedly hypermetabolic with SUV max of 43   Multifocal atrial tachycardia (HCC)    Myocardial infarction (HCC)    a.) suspected  by cardiology during 03/2013 admission for CP/CAP/new onset CHF --> TTE  showed severe LV dysfunction with segmental wall motion abnormalities and severe MR (posteriorly-directed  jet)   Pneumonia    Pre-diabetes    Pulmonary hypertension (HCC)    a.) TTE 04/08/2013: RVSP 62.2; b.) TTE 07/13/2019: RVSP 49.4; c.) TTE 07/23/2020: RVSP 33.2; d.) TTE 11/26/2021: RVSP 41.2; e.) TTE 12/24/2021: RVSP 40.5; f.) TTE 11/18/2019: RVSP 29.8   Rectal cancer (HCC)    Requires continuous at home supplemental oxygen     a.) 4L/[place]   S/P TAVR (transcatheter aortic valve replacement) 11/25/2021   a.) 26mm Edwards Sapien 3 Ultra Resilia via the TF approach   Statin intolerance    Umbilical hernia (s/p repair)     Past Surgical History:  Procedure Laterality Date   APPENDECTOMY  06/29/2003   CATARACT EXTRACTION, BILATERAL Bilateral    COLOSTOMY     HERNIA REPAIR     INTRAOPERATIVE TRANSTHORACIC ECHOCARDIOGRAM N/A 11/25/2021   Procedure: INTRAOPERATIVE TRANSTHORACIC ECHOCARDIOGRAM;  Surgeon: Verlin Lonni BIRCH, MD;  Location: [facility] OR;  Service: Open Heart Surgery;  Laterality: N/A;   resection of rectum     RIGHT/LEFT HEART CATH AND CORONARY ANGIOGRAPHY Bilateral 04/10/2013   Procedure: RIGHT/LEFT HEART CATH AND  CORONARY ANGIOGRAPHY; Location: [facility]; Surgeon: Deatrice Cage, MD   RIGHT/LEFT HEART CATH AND CORONARY ANGIOGRAPHY N/A 08/27/2021   Procedure: RIGHT/LEFT HEART CATH AND CORONARY ANGIOGRAPHY;  Surgeon: Wonda Sharper, MD;  Location[facility] INVASIVE CV LAB;  Service: Cardiovascular;  Laterality: N/A;   TRANSCATHETER AORTIC VALVE REPLACEMENT, TRANSFEMORAL Left 11/25/2021   Procedure: Transcatheter Aortic Valve Replacement, Transfemoral using a 26 MM Edwards SAPIEN 3 Ultra.;  Surgeon: Verlin Lonni BIRCH, MD;  Location: [facility] OR;  Service: Open Heart Surgery;  Laterality: Left;   VIDEO BRONCHOSCOPY WITH ENDOBRONCHIAL ULTRASOUND N/A 03/03/2023   Procedure: VIDEO BRONCHOSCOPY WITH ENDOBRONCHIAL ULTRASOUND;  Surgeon: Parris Manna, MD;  Location: [facility] ORS;  Service: Thoracic;  Laterality: N/A;     reports that he quit smoking about 10 years ago. His smoking use included cigarettes. He started smoking about 60 years ago. He has a 50 pack-year smoking history. He has never used smokeless tobacco. He reports that he does not drink alcohol  and does not use drugs.  Family History  Problem Relation Age of Onset   Heart disease Mother    Heart disease Father    Hyperlipidemia Father    Hypertension Father    Heart disease Sister        stents placed    Cancer - Cervical Sister    Heart disease Sister        CABG   Heart attack Brother 76       MI     Physical Exam: Vitals:   04/06/23 0533 04/06/23 0535 04/06/23 0545 04/06/23 0600  BP:  105/64 104/74 103/61  Pulse: 89 84 83 85  Resp: 16 12 16 17   Temp:      TempSrc:      SpO2: 98% 95% 92% 92%  Weight:      Height:        Gen: Somnolent but awakens to nonpainful stimuli can answer in one-word then falls asleep, chronically ill-appearing CV: Regular, normal S1, S2, no murmurs  Resp: Sonorous respirations, initially on nasal cannula, diminished air movement Abd: Flat, normoactive, nontender, left lower quadrant ostomy primarily  air-filled MSK: Symmetric, 1+ pitting edema  skin: No rashes or lesions to exposed skin  Neuro: Somnolent but awakens to nonpainful stimuli can  answer in one-word then falls asleep, unable to assess orientation, CN II through XII limited PERRL, no facial droop.  Motor awakens for commands, grip approximately 4 out of 5 bilaterally, can dorsiflex against gravity in both feet.  Sensation appears grossly equal. Psych: Unable to assess due to mental status   Data review:   Labs reviewed, notable for:   NA 131 K3.4 Alk phos 127, other LFT normal BNP 233 WBC 18, hemoglobin 8 TSH within normal limit  Micro:  Results for orders placed or performed during the hospital encounter of 04/05/23  Resp panel by RT-PCR (RSV, Flu A&B, Covid) Anterior Nasal Swab     Status: None   Collection Time: 04/05/23  7:51 PM   Specimen: Anterior Nasal Swab  Result Value Ref Range Status   SARS Coronavirus 2 by RT PCR NEGATIVE NEGATIVE Final    Comment: (NOTE) SARS-CoV-2 target nucleic acids are NOT DETECTED.  The SARS-CoV-2 RNA is generally detectable in upper respiratory specimens during the acute phase of infection. The lowest concentration of SARS-CoV-2 viral copies this assay can detect is 138 copies/mL. A negative result does not preclude SARS-Cov-2 infection and should not be used as the sole basis for treatment or other patient management decisions. A negative result may occur with  improper specimen collection/handling, submission of specimen other than nasopharyngeal swab, presence of viral mutation(s) within the areas targeted by this assay, and inadequate number of viral copies(<138 copies/mL). A negative result must be combined with clinical observations, patient history, and epidemiological information. The expected result is Negative.  Fact Sheet for Patients:  bloggercourse.com  Fact Sheet for Healthcare Providers:   seriousbroker.it  This test is no t yet approved or cleared by the [place]  FDA and  has been authorized for detection and/or diagnosis of SARS-CoV-2 by FDA under an Emergency Use Authorization (EUA). This EUA will remain  in effect (meaning this test can be used) for the duration of the COVID-19 declaration under Section 564(b)(1) of the Act, 21 U.S.C.section 360bbb-3(b)(1), unless the authorization is terminated  or revoked sooner.       Influenza A by PCR NEGATIVE NEGATIVE Final   Influenza B by PCR NEGATIVE NEGATIVE Final    Comment: (NOTE) The Xpert Xpress SARS-CoV-2/FLU/RSV plus assay is intended as an aid in the diagnosis of influenza from Nasopharyngeal swab specimens and should not be used as a sole basis for treatment. Nasal washings and aspirates are unacceptable for Xpert Xpress SARS-CoV-2/FLU/RSV testing.  Fact Sheet for Patients: bloggercourse.com  Fact Sheet for Healthcare Providers: seriousbroker.it  This test is not yet approved or cleared by the [place]  FDA and has been authorized for detection and/or diagnosis of SARS-CoV-2 by FDA under an Emergency Use Authorization (EUA). This EUA will remain in effect (meaning this test can be used) for the duration of the COVID-19 declaration under Section 564(b)(1) of the Act, 21 U.S.C. section 360bbb-3(b)(1), unless the authorization is terminated or revoked.     Resp Syncytial Virus by PCR NEGATIVE NEGATIVE Final    Comment: (NOTE) Fact Sheet for Patients: bloggercourse.com  Fact Sheet for Healthcare Providers: seriousbroker.it  This test is not yet approved or cleared by the [place]  FDA and has been authorized for detection and/or diagnosis of SARS-CoV-2 by FDA under an Emergency Use Authorization (EUA). This EUA will remain in effect (meaning this test can be used) for  the duration of the COVID-19 declaration under Section 564(b)(1) of the Act, 21 U.S.C. section 360bbb-3(b)(1), unless the authorization is  terminated or revoked.  Performed at [facility], [address] [place], [place] [address]     Imaging reviewed:  DG CHEST PORT 1 VIEW Result Date: 04/06/2023 CLINICAL DATA:  Lung cancer patient presents with hypoxia. [id number]. EXAM: PORTABLE CHEST 1 VIEW COMPARISON:  Portable chest yesterday at 8:02 p.m. FINDINGS: Interval increased patchy consolidation in the left lower lung field concerning for pneumonia or aspiration. Emphysematous changes and biapical scarring with nearly 5 cm left upper lobe mass are again noted. Remaining lungs are clear. The cardiac size is normal. A TAVR is again noted with aortic tortuosity and calcifications, stable mediastinum. There is suspected a small left pleural effusion as well. No new osseous findings. IMPRESSION: 1. Interval increased patchy consolidation in the left lower lung field concerning for pneumonia or aspiration. 2. Suspected small left pleural effusion. 3. Emphysematous changes and biapical scarring with nearly 5 cm known left upper lobe mass. Electronically Signed   By: Francis Quam M.D.   On: 04/06/2023 05:10   DG Chest Port 1 View Result Date: 04/05/2023 CLINICAL DATA:  Chest pain EXAM: PORTABLE CHEST 1 VIEW COMPARISON:  03/06/2023 FINDINGS: Again seen is the left upper lobe mass compatible with known primary lung cancer. Patchy opacities in the right upper lobe are unchanged. This likely reflects scarring as seen on prior chest CT. No acute confluent opacities or effusions. Heart is normal size. No acute bony abnormality. IMPRESSION: Left upper lobe mass again noted as seen on prior imaging compatible with known lung cancer. No acute cardiopulmonary disease. Electronically Signed   By: Franky Crease M.D.   On: 04/05/2023 20:21   CT Head Wo Contrast Result Date: 04/05/2023 CLINICAL DATA:  Headache EXAM: CT HEAD  WITHOUT CONTRAST TECHNIQUE: Contiguous axial images were obtained from the base of the skull through the vertex without intravenous contrast. RADIATION DOSE REDUCTION: This exam was performed according to the departmental dose-optimization program which includes automated exposure control, adjustment of the mA and/or kV according to patient size and/or use of iterative reconstruction technique. COMPARISON:  Head CT 01/18/2023.  MRI brain 01/14/2023. FINDINGS: Brain: No evidence of acute infarction, hemorrhage, hydrocephalus, extra-axial collection or mass lesion/mass effect. Again seen is mild diffuse atrophy. There is extensive periventricular and deep white matter hypodensity which is similar to the prior examination. Vascular: Atherosclerotic calcifications are present within the cavernous internal carotid arteries. Skull: Normal. Negative for fracture or focal lesion. Sinuses/Orbits: No acute finding. Other: None. IMPRESSION: 1. No acute intracranial process. 2. Stable atrophy and chronic small vessel ischemic changes. Electronically Signed   By: Greig Pique M.D.   On: 04/05/2023 20:14    EKG:  Initial EKG possible multifocal ectopic atrial rhythm without tachycardia Subsequently developed A-fib with RVR with ischemic changes diffuse ST depressions, aVR elevation.  Please see significant event note for additional details.  Underwent synchronized cardioversion x 2 with restoration of sinus rhythm.  ED Course:  Had a witnessed generalized seizure in the emergency department which was aborted with Ativan  2 mg IV.  EDP consulted with neurology Dr. Clayburn who recommended for Keppra  load + maintenance dosing, transfer to [facility], additional evaluation including repeat MRI brain with and without contrast, LP.  See significant event note, after admission developed A-fib with RVR, ischemic changes, progressive hypoxia requiring nonrebreather and still with decompensation borderline saturation and continued  RVR.  Underwent synchronized cardioversion x 2 with restoration of sinus rhythm, improving oxygen  saturations.  He was started on amiodarone  bolus and drip and heparin  for  anticoagulation for A-fib /post cardioversion.   Assessment/Plan:  83 y.o. male with hx stage II lung adenocarcinoma currently planned for upcoming radiation therapy, remote history of rectal cancer status post colostomy, aortic stenosis status post bioprosthetic TAVR coronary artery disease, heart failure with midrange ejection fraction, hypertension, hyperlipidemia, COPD, chronic hypoxic respiratory failure on 4 L O2, chronic hyponatremia, who was brought in from home due to altered mental status followed by what appeared to be possible seizure-like episode. Subsequent witnessed seizure in ED. course complicated by A-fib with RVR, associated ischemia, hypoxic respiratory failure; underwent synchronized cardioversion.  Seizure, new onset Encephalopathy Last known well time was 1/5 in the evening.  Had awoken 1/6 around 3 AM and wife reported to son that he was confused and not making sense.  Had seizure-like episode outside the hospital with stiffening.  Then after ED arrival had a witnessed generalized seizure which was aborted with 2 mg Ativan .  Considering his history of malignancy possible for metastatic lesion although recent MRI brain with and without last month was negative.  Also with his A-fib and not being on anticoagulation question if he may have occult stroke predisposing to seizure. -EDP consulted neurology Dr. Clayburn who recommended for Keppra  load + maintenance dosing, transfer to [facility], additional evaluation including routine EEG, repeat MRI brain with and without contrast, LP -Status post Keppra  1.5 g IV x 1, continue 500 mg twice daily -MRI brain with and without contrast to evaluate for stroke versus metastatic disease, other predisposing lesion - Order for Ativan  2 mg IV as needed for seizure - Seizure  precautions -Swallow evaluation when more awake  A-fib with RVR, with associated ischemic EKG changes Acute hypoxic respiratory failure, requiring nonrebreather Status post synchronized cardioversion x 2 on 1/7 Developed acute onset of A-fib with rates in the 150s associated ischemic EKG changes, had initially tried Cardizem  drip to control rates.  However continued decompensation with acute progressive hypoxia requiring nonrebreather and borderline saturations.  This case was briefly discussed with cardiology fellow Dr. Floretta considering his ischemic changes and worsening respiratory status recommending for amiodarone , possible need for cardioversion.  Due to his continued decompensation decision was made for synchronized cardioversion. - Case discussed with cardiology fellow Dr. Floretta, after transfer to [facility] or if remains at a PE waiting on bed please consult cardiology again. - Consulted with EDP Dr. Raford who performed conscious sedation, synchronized cardioversion x2 with restoration of sinus rhythm. - Continue amiodarone  drip - Pharmacy to dose heparin  for A-fib; not anticoagulated prior to admission - Lasix  40 mg IV x 1 for hypoxic failure associated with acute ischemia from A-fib - Check troponin, anticipate this will be significantly elevated considering the ischemic changes on EKG, cardioversion. - Echo to eval for ischemic changes, possible cardioembolic source considering his new onset of seizure.  Disposition: - Has been escalated to the ICU level of care, will contact P[facility]M at [facility] to see about ICU to ICU transfer additional neurology evaluation as previously planned. If he stabilizes in terms of cardiac / resp issues may be able to downgrade back to progressive later   Chronic medical problems: History lung cancer stage IIa: Per review of oncology notes no plan for chemotherapy given his underlying comorbid conditions, not felt to be surgical candidate.  Pursuing radiation therapy  which was initially scheduled this week. History of remote rectal cancer status post chemotherapy, radiation, colostomy AS with bioprosthetic TAVR: Continue home aspirin  81 mg History coronary artery disease: Continues home aspirin , intolerant to  statins Heart failure midrange ejection fraction: Last TTE 8/'24 with LVEF 40 to 50%, normal structure and function of AVR.  COPD, chronic hypoxic respiratory failure: On 4 L O2.  See acute respiratory failure per above.  8 continue equivalent of home long-acting inhaler.   Hypertension: Hold his home antihypertensives Hyperlipidemia: Statin intolerant Chronic hyponatremia: NA near baseline around 131 Acute on chronic anemia: Check iron panel, B12  Body mass index is 26.79 kg/m.    DVT prophylaxis:  IV heparin  gtts Code Status:  Full Code Diet:  Diet Orders (From admission, onward)     Start     Ordered   04/06/23 0308  Diet NPO time specified Except for: Sips with Meds, Other (See Comments), Ice Chips  Diet effective now       Comments: Sips of clears if passes swallow  Question Answer Comment  Except for Sips with Meds   Except for Other (See Comments)   Except for Ice Chips      04/06/23 0308           Family Communication: Yes discussed with his son at bedside Consults: Neurology, cardiology, PCCM Admission status:   Inpatient,  ICU  Severity of Illness: The appropriate patient status for this patient is INPATIENT. Inpatient status is judged to be reasonable and necessary in order to provide the required intensity of service to ensure the patient's safety. The patient's presenting symptoms, physical exam findings, and initial radiographic and laboratory data in the context of their chronic comorbidities is felt to place them at high risk for further clinical deterioration. Furthermore, it is not anticipated that the patient will be medically stable for discharge from the hospital within 2 midnights of admission.   * I certify  that at the point of admission it is my clinical judgment that the patient will require inpatient hospital care spanning beyond 2 midnights from the point of admission due to high intensity of service, high risk for further deterioration and high frequency of surveillance required.*   Dorn Dawson, MD [facility]  How to contact the [facility] Attending or Consulting provider 7A - 7P or covering provider during after hours 7P -7A, for this patient.  Check the care team in [facility] and look for a) attending/consulting [facility] provider listed and b) the [facility] team listed Log into [email or web address] and use [place]'s universal password to access. If you do not have the password, please contact the hospital operator. Locate the [facility] provider you are looking for under [facility] and page to a number that you can be directly reached. If you still have difficulty reaching the provider, please page the [facility] (Director on Call) for the Hospitalists listed on amion for assistance.  04/06/2023, 6:16 AM    CRITICAL CARE Performed by: Dorn Dawson   Total critical care time: 40 minutes  Critical care time was exclusive of separately billable procedures and treating other patients.  Critical care was necessary to treat or prevent imminent or life-threatening deterioration.  Critical care was time spent personally by me on the following activities: development of treatment plan with patient and/or surrogate as well as nursing, discussions with consultants, evaluation of patient's response to treatment, examination of patient, obtaining history from patient or surrogate, ordering and performing treatments and interventions, ordering and review of laboratory studies, ordering and review of radiographic studies, pulse oximetry and re-evaluation of patient's condition.

## 2023-04-06 NOTE — Significant Event (Addendum)
 Please see full H+P to follow. Admitted with new onset of seizure. Neurology had been consulted + loaded with keppra , seizures controlled and mental status improving. Developed acute onset of Afib with RVR in the 150s, associated ischemic changes with diffuse ST depression and aVR elevation. Initial trial of Cardizem  10 mg IV + gtt. Rate remained uncontrolled. Developed worsening respiratory status requiring escalation from 4L -> 8L venturi -> desat to 70% and requiring NRB with borderline sats. CXR with findings of developing pulmonary edema. I discussed case with Cardiology fellow Dr. Floretta, considering his ischemic changes and worsening respiratory status that likely needs change to amiodarone  and possible electrical cardioversion. He had continued decompensation with borderline O2 saturation and decision was made to electrically cardiovert. Informed consent obtained via patients Son at bedside. Contacted Dr. Raford EDP who was able to perform conscious sedation with propofol  and sync cardioversion. Required 2 shocks 120 -> 120 J. Had transient desaturation and required bagging. Post procedure sinus rhythm, improved O2 saturation. Plan for diuresis with lasix  40 mg IV. Continue amiodarone  gtt. Plan to transition to Kirby v high flow. If continued requirement on NRB and mental status allows may transition to BiPAP in that case. Escalated to ICU LOC and I will discuss with PCCM at [facility]   Dorn Dawson, MD  [facility]

## 2023-04-06 NOTE — Progress Notes (Signed)
 Patient was cardioverted X2.  Patient did well throughout procedure but did have to be bagged for a short period after second shock.  Patient is now back on Venturi mask at 8L.

## 2023-04-06 NOTE — Progress Notes (Signed)
 LTM EEG running - no initial skin breakdown - push button tested - neuro notified. [facility] charge captured

## 2023-04-07 DIAGNOSIS — J9621 Acute and chronic respiratory failure with hypoxia: Secondary | ICD-10-CM | POA: Diagnosis not present

## 2023-04-07 DIAGNOSIS — I4891 Unspecified atrial fibrillation: Secondary | ICD-10-CM

## 2023-04-07 DIAGNOSIS — R4182 Altered mental status, unspecified: Secondary | ICD-10-CM

## 2023-04-07 DIAGNOSIS — I5022 Chronic systolic (congestive) heart failure: Secondary | ICD-10-CM

## 2023-04-07 DIAGNOSIS — I4819 Other persistent atrial fibrillation: Secondary | ICD-10-CM

## 2023-04-07 DIAGNOSIS — G9341 Metabolic encephalopathy: Secondary | ICD-10-CM

## 2023-04-07 DIAGNOSIS — R569 Unspecified convulsions: Secondary | ICD-10-CM | POA: Diagnosis not present

## 2023-04-07 DIAGNOSIS — I2510 Atherosclerotic heart disease of native coronary artery without angina pectoris: Secondary | ICD-10-CM

## 2023-04-07 DIAGNOSIS — Z952 Presence of prosthetic heart valve: Secondary | ICD-10-CM

## 2023-04-07 DIAGNOSIS — I480 Paroxysmal atrial fibrillation: Secondary | ICD-10-CM | POA: Diagnosis not present

## 2023-04-07 LAB — BASIC METABOLIC PANEL
Anion gap: 19 — ABNORMAL HIGH (ref 5–15)
BUN: 18 mg/dL (ref 8–23)
CO2: 24 mmol/L (ref 22–32)
Calcium: 8.6 mg/dL — ABNORMAL LOW (ref 8.9–10.3)
Chloride: 90 mmol/L — ABNORMAL LOW (ref 98–111)
Creatinine, Ser: 0.74 mg/dL (ref 0.61–1.24)
GFR, Estimated: >60 mL/min (ref >60)
Glucose, Bld: 94 mg/dL (ref 70–99)
Potassium: 3.9 mmol/L (ref 3.5–5.1)
Sodium: 133 mmol/L — ABNORMAL LOW (ref 135–145)

## 2023-04-07 LAB — HEPARIN LEVEL (UNFRACTIONATED)
Heparin Unfractionated: <0.1 [IU]/mL — ABNORMAL LOW (ref 0.30–0.70)
Heparin Unfractionated: 0.13 [IU]/mL — ABNORMAL LOW (ref 0.30–0.70)

## 2023-04-07 LAB — CBC
HCT: 27.3 % — ABNORMAL LOW (ref 39.0–52.0)
Hemoglobin: 8.8 g/dL — ABNORMAL LOW (ref 13.0–17.0)
MCH: 27.1 pg (ref 26.0–34.0)
MCHC: 32.2 g/dL (ref 30.0–36.0)
MCV: 84 fL (ref 80.0–100.0)
Platelets: 586 10*3/uL — ABNORMAL HIGH (ref 150–400)
RBC: 3.25 MIL/uL — ABNORMAL LOW (ref 4.22–5.81)
RDW: 15 % (ref 11.5–15.5)
WBC: 23.9 10*3/uL — ABNORMAL HIGH (ref 4.0–10.5)
nRBC: 0 % (ref 0.0–0.2)

## 2023-04-07 LAB — VITAMIN B12: Vitamin B-12: 668 pg/mL (ref 180–914)

## 2023-04-07 LAB — IRON AND TIBC
Iron: 23 ug/dL — ABNORMAL LOW (ref 45–182)
Saturation Ratios: 14 % — ABNORMAL LOW (ref 17.9–39.5)
TIBC: 164 ug/dL — ABNORMAL LOW (ref 250–450)
UIBC: 141 ug/dL

## 2023-04-07 LAB — PROCALCITONIN: Procalcitonin: 0.1 ng/mL

## 2023-04-07 LAB — FOLATE: Folate: 7.4 ng/mL (ref >5.9)

## 2023-04-07 LAB — TRANSFERRIN: Transferrin: 110 mg/dL — ABNORMAL LOW (ref 180–329)

## 2023-04-07 LAB — FERRITIN: Ferritin: 757 ng/mL — ABNORMAL HIGH (ref 24–336)

## 2023-04-07 LAB — MAGNESIUM: Magnesium: 2 mg/dL (ref 1.7–2.4)

## 2023-04-07 MED ORDER — SODIUM CHLORIDE 1 G PO TABS
1.0000 g | ORAL_TABLET | Freq: Two times a day (BID) | ORAL | Status: DC
  Administered 2023-04-08 – 2023-04-10 (×6): 1 g via ORAL
  Filled 2023-04-07 (×6): qty 1

## 2023-04-07 MED ORDER — LACOSAMIDE 50 MG PO TABS
100.0000 mg | ORAL_TABLET | Freq: Two times a day (BID) | ORAL | Status: DC
  Administered 2023-04-07 – 2023-04-10 (×7): 100 mg via ORAL
  Filled 2023-04-07 (×7): qty 2

## 2023-04-07 MED ORDER — DEXTROSE 5 % IV SOLN
750.0000 mg | Freq: Three times a day (TID) | INTRAVENOUS | Status: DC
  Administered 2023-04-07 – 2023-04-08 (×3): 750 mg via INTRAVENOUS
  Filled 2023-04-07 (×5): qty 15

## 2023-04-07 MED ORDER — METOPROLOL TARTRATE 25 MG PO TABS
25.0000 mg | ORAL_TABLET | Freq: Two times a day (BID) | ORAL | Status: DC
  Administered 2023-04-07 (×2): 25 mg via ORAL
  Filled 2023-04-07 (×2): qty 1

## 2023-04-07 MED ORDER — SODIUM CHLORIDE 0.9 % IV SOLN
2.0000 g | Freq: Two times a day (BID) | INTRAVENOUS | Status: DC
  Administered 2023-04-07 – 2023-04-08 (×3): 2 g via INTRAVENOUS
  Filled 2023-04-07 (×3): qty 20

## 2023-04-07 MED ORDER — SODIUM CHLORIDE 0.9 % IV SOLN: INTRAVENOUS | Status: DC

## 2023-04-07 MED ORDER — VANCOMYCIN HCL IN DEXTROSE 1-5 GM/200ML-% IV SOLN
1000.0000 mg | Freq: Two times a day (BID) | INTRAVENOUS | Status: DC
  Administered 2023-04-07 – 2023-04-08 (×2): 1000 mg via INTRAVENOUS
  Filled 2023-04-07 (×3): qty 200

## 2023-04-07 MED ORDER — TRAZODONE HCL 50 MG PO TABS
50.0000 mg | ORAL_TABLET | Freq: Every evening | ORAL | Status: DC | PRN
  Administered 2023-04-07 – 2023-04-10 (×4): 50 mg via ORAL
  Filled 2023-04-07 (×4): qty 1

## 2023-04-07 MED ORDER — HEPARIN (PORCINE) 25000 UT/250ML-% IV SOLN
1900.0000 [IU]/h | INTRAVENOUS | Status: DC
  Administered 2023-04-07: 1600 [IU]/h via INTRAVENOUS
  Administered 2023-04-07: 1450 [IU]/h via INTRAVENOUS
  Administered 2023-04-08: 1600 [IU]/h via INTRAVENOUS
  Administered 2023-04-09: 2000 [IU]/h via INTRAVENOUS
  Administered 2023-04-09: 1800 [IU]/h via INTRAVENOUS
  Administered 2023-04-10: 1900 [IU]/h via INTRAVENOUS
  Filled 2023-04-07 (×5): qty 250

## 2023-04-07 MED ORDER — HEPARIN BOLUS VIA INFUSION
3000.0000 [IU] | Freq: Once | INTRAVENOUS | Status: DC
  Filled 2023-04-07: qty 3000

## 2023-04-07 MED ORDER — VALPROATE SODIUM 100 MG/ML IV SOLN
250.0000 mg | Freq: Four times a day (QID) | INTRAVENOUS | Status: DC
  Administered 2023-04-07 – 2023-04-08 (×2): 250 mg via INTRAVENOUS
  Filled 2023-04-07 (×9): qty 2.5

## 2023-04-07 MED ORDER — CHLORHEXIDINE GLUCONATE CLOTH 2 % EX PADS
6.0000 | MEDICATED_PAD | Freq: Every day | CUTANEOUS | Status: DC
  Administered 2023-04-07 – 2023-04-11 (×5): 6 via TOPICAL

## 2023-04-07 MED ORDER — THIAMINE HCL 100 MG/ML IJ SOLN
100.0000 mg | Freq: Every day | INTRAMUSCULAR | Status: DC
  Filled 2023-04-07 (×3): qty 2

## 2023-04-07 MED ORDER — GUAIFENESIN ER 600 MG PO TB12
600.0000 mg | ORAL_TABLET | Freq: Two times a day (BID) | ORAL | Status: DC
  Administered 2023-04-07 – 2023-04-10 (×7): 600 mg via ORAL
  Filled 2023-04-07 (×8): qty 1

## 2023-04-07 MED ORDER — ENSURE ENLIVE PO LIQD
237.0000 mL | Freq: Two times a day (BID) | ORAL | Status: DC
  Administered 2023-04-08 – 2023-04-10 (×5): 237 mL via ORAL

## 2023-04-07 MED ORDER — THIAMINE MONONITRATE 100 MG PO TABS
100.0000 mg | ORAL_TABLET | Freq: Every day | ORAL | Status: DC
  Administered 2023-04-07 – 2023-04-10 (×4): 100 mg via ORAL
  Filled 2023-04-07 (×6): qty 1

## 2023-04-07 MED ORDER — METOPROLOL SUCCINATE ER 25 MG PO TB24
25.0000 mg | ORAL_TABLET | Freq: Every day | ORAL | Status: DC
  Administered 2023-04-07: 25 mg via ORAL
  Filled 2023-04-07: qty 1

## 2023-04-07 MED ORDER — VALPROATE SODIUM 100 MG/ML IV SOLN
1000.0000 mg | Freq: Once | INTRAVENOUS | Status: AC
  Administered 2023-04-07: 1000 mg via INTRAVENOUS
  Filled 2023-04-07: qty 10

## 2023-04-07 MED ORDER — IPRATROPIUM-ALBUTEROL 0.5-2.5 (3) MG/3ML IN SOLN: 3.0000 mL | RESPIRATORY_TRACT | Status: DC | PRN

## 2023-04-07 MED ORDER — SODIUM CHLORIDE 0.9 % IV SOLN
2.0000 g | INTRAVENOUS | Status: DC
  Administered 2023-04-07 – 2023-04-08 (×6): 2 g via INTRAVENOUS
  Filled 2023-04-07 (×8): qty 2000

## 2023-04-07 NOTE — Procedures (Addendum)
 Patient Name: Curtis Campos  MRN: [id number]  Epilepsy Attending: Arlin MALVA Krebs  Referring Physician/Provider: Voncile Isles, MD  Duration: 04/06/2023 2041 to 04/07/2023 2041   Patient history: 83 year old male with stage II lung adenocarcinoma plans for radiation who presented with altered mental status and proceeded to have focal seizures which then generalized. EEG to evaluate for seizure    Level of alertness: Awake, asleep   AEDs during EEG study: LEV   Technical aspects: This EEG study was done with scalp electrodes positioned according to the 10-20 International system of electrode placement. Electrical activity was reviewed with band pass filter of 1-70Hz , sensitivity of 7 uV/mm, display speed of 98mm/sec with a 60Hz  notched filter applied as appropriate. EEG data were recorded continuously and digitally stored.  Video monitoring was available and reviewed as appropriate.   Description: No clear posterior dominant rhythm was seen. Sleep was characterized by sleep spindles (12 to 14 Hz), maximal frontocentral region. EEG showed 3-5hz  theta-delta slowing with overriding 13 to 15 Hz beta activity. Sharp waves were noted in the hemisphere, maximal left frontal temporal region which at times appeared periodic at 1 Hz.  Gradually the sharp waves evolved into generalized periodic discharges with triphasic morphology at 1 Hz.  After around 10 AM, patient was noted to have generalized periodic discharges (GPD) with triphasic morphology with fluctuating frequency.  Once every few hours, EEG showed Gpds at 2-2.5hz  with overlying rhythmicity and fast activity lasting 1 to 2 minutes without definite evolution.  This EEG pattern then became more frequent and was noted 3-6 times every hour.  No clinical signs were seen during this EEG pattern. Hyperventilation and photic stimulation were not performed.      ABNORMALITY - Sharp waves, left hemisphere, maximal left frontotemporal region -Periodic  discharges with triphasic morphology, generalized - Continuous slow, generalized and lateralized left hemisphere   IMPRESSION: This study initially showed evidence of epileptogenicity and cortical dysfunction arising from left hemisphere, maximal in frontotemporal region. Additionally there is moderate diffuse encephalopathy.  No definite seizures were seen during this time.  Gradually, EEG worsened and showed generalized periodic discharges with triphasic morphology and fluctuating frequency of 1 to 2.5 Hz with overlying rhythmicity lasting 1 to 2 minutes.  This EEG pattern is on the ictal -interictal continuum with increased risk of seizures.    Curtis Campos

## 2023-04-07 NOTE — Progress Notes (Signed)
 Addendum:  Since evaluation by me earlier this morning, discussed with Dr. Towanda, neurology, his input appreciated. Due to new onset seizures, mental status change, worsening leukocytosis, hallucinations, recommends LP which cannot be done at bedside due to confusion and hence IR consulted and plan for it on 1/9 after holding IV heparin  appropriately. Also empiric meningitis coverage with vancomycin , ceftriaxone , ampicillin  and acyclovir  due to worsening leukocytosis, confusion and new onset seizures.  Started gentle IV fluids for renal protection Keppra  was switched to valproic  acid and hide dose IV thiamine  started since Keppra  can cause mental status changes.  Trenda Mar, MD,  FACP, [facility], [facility], [facility]   [facility]     To contact the attending provider between 7A-7P or the covering provider during after hours 7P-7A, please log into the web site [email or web address] and access using universal [place] password for that web site. If you do not have the password, please call the hospital operator.

## 2023-04-07 NOTE — Evaluation (Signed)
 Occupational Therapy Evaluation Patient Details Name: Curtis Campos MRN: [id number] DOB: 12-22-1940 Today's Date: 04/07/2023   History of Present Illness Curtis Campos is a 83 yo male who initially presented to [facility] with AMS, he had a witnessed seizure and was transferred to [facility] for LT EEG monitoring. LP planned 1/9. PMHx: stage II lung adenocarcinoma, rectal cancer status post colostomy, aortic stenosis status post bioprosthetic TAVR, coronary artery disease, chronic systolic heart failure, hypertension, hyperlipidemia, COPD, chronic hypoxic respiratory failure on 4 L O2 at baseline, HLD, paroxysmal atrial fibrillation and chronic hyponatremia.   Clinical Impression   Curtis Campos was evaluated s/p the above admission list. He is mod I for mobility and ADLs with rollator at baseline, he had one recent fall, lives with his wife, manages IADLs and drives short distances. Upon evaluation the pt was limited by AMS, impaired cognition, weakness, chronic L shoulder pain, unsteady balance, generalized weakness and limited activity tolerance. Overall he needed min-max A for bed mobility and CGA-mod A for sitting balance. Pt followed most simple one step cues to stand and side step with RW given min A. Due to the deficits listed below the pt also needs up to max A for LB ADLs and mod A for UB ADLs. Pt will benefit from continued acute OT services and intensive inpatient follow up therapy, >3 hours/day after discharge.         If plan is discharge home, recommend the following: A lot of help with walking and/or transfers;Two people to help with walking and/or transfers;A lot of help with bathing/dressing/bathroom;Two people to help with bathing/dressing/bathroom;Assistance with feeding;Assistance with cooking/housework;Direct supervision/assist for medications management;Assist for transportation;Direct supervision/assist for financial management;Help with stairs or ramp for entrance;Supervision due to  cognitive status    Functional Status Assessment  Patient has had a recent decline in their functional status and demonstrates the ability to make significant improvements in function in a reasonable and predictable amount of time.  Equipment Recommendations  None recommended by OT (defer pending progress)    Recommendations for Other Services Rehab consult     Precautions / Restrictions Precautions Precautions: Fall Precaution Comments: LTM EEG, seizure Restrictions Weight Bearing Restrictions Per Provider Order: No      Mobility Bed Mobility Overal bed mobility: Needs Assistance Bed Mobility: Supine to Sit, Sit to Supine     Supine to sit: Min assist Sit to supine: Max assist        Transfers Overall transfer level: Needs assistance Equipment used: Rolling walker (2 wheels) Transfers: Sit to/from Stand Sit to Stand: Min assist           General transfer comment: able to take side steps with Min A and cues, limited by EEG      Balance Overall balance assessment: Needs assistance Sitting-balance support: Feet supported Sitting balance-Leahy Scale: Fair     Standing balance support: Bilateral upper extremity supported, During functional activity Standing balance-Leahy Scale: Poor                             ADL either performed or assessed with clinical judgement   ADL Overall ADL's : Needs assistance/impaired Eating/Feeding: Moderate assistance   Grooming: Moderate assistance   Upper Body Bathing: Moderate assistance   Lower Body Bathing: Maximal assistance   Upper Body Dressing : Moderate assistance   Lower Body Dressing: Maximal assistance   Toilet Transfer: Minimal assistance;Stand-pivot;Rolling walker (2 wheels);BSC/3in1   Toileting- Clothing Manipulation and  Hygiene: Maximal assistance;Sit to/from stand       Functional mobility during ADLs: Minimal assistance General ADL Comments: significant cues needed, unsteady gait,  poor initiation and sequencing of tasks     Vision Baseline Vision/History: 0 No visual deficits Vision Assessment?: No apparent visual deficits     Perception Perception: Not tested       Praxis Praxis: Not tested       Pertinent Vitals/Pain Pain Assessment Pain Assessment: Faces Pain Score: 0-No pain Pain Intervention(s): Monitored during session     Extremity/Trunk Assessment Upper Extremity Assessment Upper Extremity Assessment: LUE deficits/detail LUE Deficits / Details: Chronic L shoulder immobility and pain with movement, compensatory movement patterns noted. Elbow, wrist and hand seem WFL. LUE Coordination: decreased gross motor   Lower Extremity Assessment Lower Extremity Assessment: Defer to PT evaluation   Cervical / Trunk Assessment Cervical / Trunk Assessment: Kyphotic (mild)   Communication Communication Communication: No apparent difficulties   Cognition Arousal: Alert Behavior During Therapy: Impulsive, Agitated, Restless Overall Cognitive Status: Impaired/Different from baseline Area of Impairment: Orientation, Attention, Safety/judgement, Following commands, Awareness, Problem solving                 Orientation Level: Disoriented to, Situation, Time Current Attention Level: Focused   Following Commands: Follows one step commands inconsistently Safety/Judgement: Decreased awareness of safety, Decreased awareness of deficits Awareness: Intellectual Problem Solving: Decreased initiation, Requires verbal cues, Difficulty sequencing General Comments: AMS, confusion. easily redirected. follows simple commands with increased cues. aware of being in the hospital and being 2025. perseverating on 4 million dollars and this is ridiculous     General Comments  VSS            Home Living Family/patient expects to be discharged to:: Private residence Living Arrangements: Spouse/significant other Available Help at Discharge: Family;Available  24 hours/day Type of Home: House Home Access: Stairs to enter [employer] of Steps: 4 (ramp currently being built)   Home Layout: One level     Bathroom Shower/Tub: Runner, Broadcasting/film/video: Rollator (4 wheels);Shower seat;Grab bars - tub/shower;Wheelchair - manual          Prior Functioning/Environment Prior Level of Function : Independent/Modified Independent             Mobility Comments: ambulates with rollator, 1 recent fall last friday ADLs Comments: mod I, drives short distances. assists wife with housework. manages his own meds        OT Problem List: Decreased range of motion;Decreased strength;Decreased activity tolerance;Impaired balance (sitting and/or standing);Decreased cognition;Decreased knowledge of use of DME or AE;Decreased safety awareness;Decreased knowledge of precautions;Pain      OT Treatment/Interventions: Self-care/ADL training;Therapeutic exercise;DME and/or AE instruction;Therapeutic activities;Patient/family education;Balance training    OT Goals(Current goals can be found in the care plan section) Acute Rehab OT Goals Patient Stated Goal: unable OT Goal Formulation: With patient Time For Goal Achievement: 04/21/23 Potential to Achieve Goals: Good ADL Goals Pt Will Perform Grooming: with set-up;sitting Pt Will Perform Upper Body Dressing: with set-up;sitting Pt Will Perform Lower Body Dressing: with min assist;sit to/from stand Additional ADL Goal #1: pt will indep sequence simple ADL task  OT Frequency: Min 1X/week       AM-PAC OT 6 Clicks Daily Activity     Outcome Measure Help from another person eating meals?: A Lot Help from another person taking care of personal grooming?: A Lot Help from another person toileting, which includes using toliet, bedpan, or  urinal?: A Lot Help from another person bathing (including washing, rinsing, drying)?: A Lot Help from another person to put on and taking off regular  upper body clothing?: A Lot Help from another person to put on and taking off regular lower body clothing?: A Lot 6 Click Score: 12   End of Session Equipment Utilized During Treatment: Rolling walker (2 wheels) Nurse Communication: Mobility status  Activity Tolerance: Patient tolerated treatment well Patient left: in bed;with call bell/phone within reach;with bed alarm set;with family/visitor present  OT Visit Diagnosis: Unsteadiness on feet (R26.81);Other abnormalities of gait and mobility (R26.89);Muscle weakness (generalized) (M62.81);History of falling (Z91.81)                Time: 8554-8481 OT Time Calculation (min): 33 min Charges:  OT General Charges $OT Visit: 1 Visit OT Evaluation $OT Eval Moderate Complexity: 1 Mod OT Treatments $Therapeutic Activity: 8-22 mins  Lucie Kendall, OTR/L Acute Rehabilitation Services Office [phone] Secure Chat Communication Preferred   Lucie JONETTA Kendall 04/07/2023, 3:57 PM

## 2023-04-07 NOTE — Progress Notes (Signed)
 PHARMACY - ANTICOAGULATION CONSULT NOTE  Pharmacy Consult for IV heparin  Indication: atrial fibrillation  Allergies  Allergen Reactions   Codeine Nausea Only   Statins Other (See Comments)    REACTION: myalgias    Patient Measurements: Height: 5' 6 (167.6 cm) Weight: 75.3 kg (166 lb 0.1 oz) IBW/kg (Calculated) : 63.8 Heparin  Dosing Weight: 75.3 kg  Vital Signs: Temp: 98.7 F (37.1 C) (01/08 0726) Temp Source: Oral (01/08 0726) BP: 143/92 (01/08 0726) Pulse Rate: 94 (01/08 0726)  Labs: Recent Labs    04/05/23 1902 04/05/23 2049 04/06/23 0448 04/06/23 1158 04/07/23 0536 04/07/23 0603 04/07/23 0630  HGB 8.8*  --  8.8*  --   --   --  8.8*  HCT 29.0*  --  27.7*  --   --   --  27.3*  PLT 574*  --  574*  --   --   --  586*  LABPROT 15.4*  --   --   --   --   --   --   INR 1.2  --   --   --   --   --   --   HEPARINUNFRC  --   --   --  <0.10*  --  <0.10*  --   CREATININE 0.77  --  0.75  --  0.74  --   --   CKTOTAL  --  46*  --   --   --   --   --   TROPONINIHS  --   --  16  --   --   --   --     Estimated Creatinine Clearance: 64.2 mL/min (by C-G formula based on SCr of 0.74 mg/dL).   Medical History: Past Medical History:  Diagnosis Date   Agent orange exposure    Anemia    Anxiety    Aortic atherosclerosis (HCC)    Aortic stenosis 07/04/2013   a.)TTE 07/04/13: mild (MPG 12; AVA 1.6); b.) TTE 01/01/16: mod (MPG 14; AVA 1.17); c.) TTE 08/30/17: mod (MPG 15; AVA 1.25); d.) TTE 07/13/19: mod (MPG 19; AVA 1.16); e.) TTE 07/23/20: mod (MPG 22; AVA 1.25); f.) TTE 03/12/2021: mod-sev (MPH 34; AVA 0.75); g.) TTE 05/21/2021: mod (MPG 22; AVA 0.8); h.) RHC 08/27/2020: sev (MPG 26.18; AVA 0.69); i.) s/p TAVR 11/25/21: 26 mm Edwards S3 Ultra Resilia   Arthritis 03/12/2011   Cardiomegaly    Cerebral microvascular disease    Cholelithiasis    Chronic hyponatremia    Chronic obstructive lung disease (HCC)    Colostomy in place [facility])    a.) s/p resection secondary to rectal  cancer   Coronary artery disease 03/2013   a.) LHC 04/10/2013: 10% mLAD, 50% D1, 30% D2, 99% mLCx, 50% OM1, 40% pRCA, 30% mRCA, 60% RPDA, 40% RPLS - med mgmt; b.) MV 08/31/2019: basal inflat/mid inflat defect c/w ischemia in LCx distribution. EF 37%; c.) R/LHC 08/27/2020: 50% D1, 50% D2, 90% mLCx, 40% p-mRCA, 80% RPDA - med mgmt   Former smoker    HFrEF (heart failure with reduced ejection fraction) (HCC)    a.) TEE 04/13/13: EF 20-25, glob HK, G1DD, mild LAE, mod-sev MR; b.) TTE 08/30/17: EF 45-50, LVH, mild LAE, G1DD, mod AS, mod MR; c.) TTE 07/13/19: EF 30-35, glob HK, G1DD, RVSP 49.4, mild LAE, mild-mod MR, mod AS; d.) TTE 03/12/21: EF 55, G2DD, mild MR, mod-sev AS; e.) TTE 12/24/21: EF 50, mild LVH, G1DD, RVSP 40.5, mild-mod MR, triv AR; f.) TTE 11/18/22:  EF 45-50, glob HK, G1DD, mild MR   History of bilateral cataract extraction    Hyperlipidemia    Hypertension    Hypokalemia    Ischemic cardiomyopathy    a.) TTE 04/08/2014: EF 25-30%; b.) TEE 04/13/2013: EF 20-25%; c.) TTE 07/04/2013: EF 30-35%; d.) TTE 01/01/2016: EF 40-45%; e.) TTE 08/30/2017: EF 45-50%; f.) TTE 07/03/2019: EF 30-35%; g.) MV 08/31/2019: EF 37%; h.) TTE 07/23/20: EF 55-60%; i.) TTE 03/12/2021: EF 55%; j.) TTE 05/21/2021: EF 40-45%; k.) TTE 11/26/2021: EF 45-50%; l.) TTE 12/24/2021: EF 50%; m.) TTE 11/18/2022: EF 45-50%   Long term (current) use of aspirin     Mass of upper lobe of left lung 03/10/2021   a.) CTA chest 03/10/2021: 6.1 mm; b.) CTA chest 10/15/2021: 8 mm; c.) PET CT 11/14/2021: 8 mm (SUV max 2.8); d.) CXR 10/05/2022: interval increase to 2.7 cm; e.) CXR 01/18/2023: interval increase to 3.7 cm; f.) CT chest 01/22/2023: 3.8 x 3.2 cm; g.) PET CT 02/01/2023: 3.8 cm and markedly hypermetabolic with SUV max of 43   Multifocal atrial tachycardia (HCC)    Myocardial infarction (HCC)    a.) suspected by cardiology during 03/2013 admission for CP/CAP/new onset CHF --> TTE  showed severe LV dysfunction with segmental wall motion  abnormalities and severe MR (posteriorly-directed  jet)   Pneumonia    Pre-diabetes    Pulmonary hypertension (HCC)    a.) TTE 04/08/2013: RVSP 62.2; b.) TTE 07/13/2019: RVSP 49.4; c.) TTE 07/23/2020: RVSP 33.2; d.) TTE 11/26/2021: RVSP 41.2; e.) TTE 12/24/2021: RVSP 40.5; f.) TTE 11/18/2019: RVSP 29.8   Rectal cancer (HCC)    Requires continuous at home supplemental oxygen     a.) 4L/[place]   S/P TAVR (transcatheter aortic valve replacement) 11/25/2021   a.) 26mm Edwards Sapien 3 Ultra Resilia via the TF approach   Statin intolerance    Umbilical hernia (s/p repair)    Assessment: Curtis Campos is a 83 y.o. year old male admitted on 04/05/2023 with concern for new afib RVR. No anticoagulation prior to admission. Pharmacy consulted to dose heparin .  Heparin  level came back undetectable. Plan for possible LP so we will avoid giving bolus.   Goal of Therapy:  Heparin  level 0.3-0.7 units/ml Monitor platelets by anticoagulation protocol: Yes   Plan:  Increase heparin  to 1450 units/hr Check 8 hour heparin  level Daily heparin  level, CBC, and monitoring for bleeding F/u plans for anticoagulation   Sergio Batch, PharmD, BCIDP, AAHIVP, CPP Infectious Disease Pharmacist 04/07/2023 7:53 AM

## 2023-04-07 NOTE — Plan of Care (Signed)
 Neurology will follow this patient. I spoke with the son, Mr. Curtistine Shams at [phone], to obtain consent for an LP. Risks, benefits and alternatives were explained.  His wife was also on the speaker phone and asked questions.  All questions were answered. Increased risk of bleeding due to him being on blood thinners as well as risk of infection and another risks were discussed. Witness consent form will be filed in the chart. Plan for LP at around 1 PM. Heparin  on hold at 9 AM.  -- Eligio Lav, MD Neurologist [facility]

## 2023-04-07 NOTE — Plan of Care (Signed)
 Sitting up in the bed talking with family.  No complaints.  More alert and less confused, as was very confused earlier this shift and kept trying to get out of bed and was saying was going to leave.  Now, after many antibiotics and keppra  dc'd, he is calmer and more lucid.  Family at side and has helped to keep him oriented.    Problem: Education: Goal: Knowledge of General Education information will improve Description: Including pain rating scale, medication(s)/side effects and non-pharmacologic comfort measures Outcome: Progressing   Problem: Health Behavior/Discharge Planning: Goal: Ability to manage health-related needs will improve Outcome: Progressing   Problem: Clinical Measurements: Goal: Will remain free from infection Outcome: Progressing   Problem: Clinical Measurements: Goal: Respiratory complications will improve Outcome: Progressing   Problem: Activity: Goal: Risk for activity intolerance will decrease Outcome: Progressing   Problem: Nutrition: Goal: Adequate nutrition will be maintained Outcome: Progressing   Problem: Pain Management: Goal: General experience of comfort will improve Outcome: Progressing   Problem: Safety: Goal: Ability to remain free from injury will improve Outcome: Progressing

## 2023-04-07 NOTE — Progress Notes (Addendum)
 Patient Name: Curtis Campos Date of Encounter: 04/07/2023 [facility] Cardiologist: Deatrice Cage, MD   Interval Summary  .    Patient resting comfortably in bed this morning, pleasant but confused. Denies focal symptoms this morning.   Vital Signs .    Vitals:   04/06/23 1938 04/07/23 0726 04/07/23 1127 04/07/23 1215  BP:  (!) 143/92  (!) 157/72  Pulse:  94 94 99  Resp:  16    Temp:  98.7 F (37.1 C) 98.1 F (36.7 C)   TempSrc:  Oral Oral   SpO2: (!) 82% 100% 93%   Weight:      Height:        Intake/Output Summary (Last 24 hours) at 04/07/2023 1426 Last data filed at 04/07/2023 0500 Gross per 24 hour  Intake 746.12 ml  Output 600 ml  Net 146.12 ml      04/06/2023    4:30 AM 04/05/2023    9:51 PM 04/05/2023    9:00 PM  Last 3 Weights  Weight (lbs) 166 lb 0.1 oz 166 lb 1.6 oz 166 lb 7.2 oz  Weight (kg) 75.3 kg 75.342 kg 75.5 kg      Telemetry/ECG    Sinus arrhythmia with PACs. No afib noted since transfer - Personally Reviewed  Physical Exam .   GEN: Age-appropriate, hemodynamically stable, confused.  Neck: No JVD Cardiac: RRR, no murmurs, rubs, or gallops.  Respiratory: faint end-inspiratory wheezing GI: Soft, nontender, non-distended  MS: No edema  Assessment & Plan .     GEE HABIG is a 83 y.o. male with a hx of with hx stage II lung adenocarcinoma currently planned for upcoming radiation therapy, remote history of rectal cancer status post colostomy, aortic stenosis status post bioprosthetic TAVR 8/2023coronary artery disease, heart failure with ejection fraction 45-50% 10/2022, hypertension-recent hypotension, hyperlipidemia, COPD, chronic hypoxic respiratory failure on 4 L O2, chronic hyponatremia,   who is being seen for the evaluation of Afib with RVR and ischemic EKG changes/hypoxia s/p synchronized cardioversion in ED x2 at the request of Dr. Sebastian.   Atrial fibrillation with RVR CHADS2-VASc 5  Patient brought to [facility] ED after family  noted confusion with possible seizure activity. He was noted to develop afib with RVR in the ED with diffuse ST segment depression, elevation in aVR, hypoxia and was cardioverted x2. Hx of paroxysmal afib but has not been on OAC before. Patient started on Amiodarone  and heparin . Since DCCV/arriving to [facility], no evidence of recurrent afib. Sinus rhythm this morning with stable rates.   Heparin  now on hold this morning with patient pending LP. Will need to resume heparin ->transition to DOAC once cleared by neurology.  Patient remains on Amiodarone  at this time. Given underlying pulmonary disease, not ideal long term strategy and also with need for anti-seizure treatment, potential for interaction high. Stop Amiodarone .  He was also recently taken off Coreg  due to low BP, resumed on this yesterday. Metoprolol  might be preferable beta blocker both from a BP and cardio-selectivity standpoint. Convert to Toprol .   Coronary artery disease Single-vessel CAD in mid LCx per angiography May 2023 found to be unchanged from January 2015. Despite RVR and seizure, patient with negative troponin. No chest pain reported.  Continue medical management including Praluent . Once on DOAC, would stop ASA.   Chronic HFrEF Echo August 2024 showed EF 45 to 50%, global hypokinesis, Grade I DD. Patient with baseline shortness of breath. He is on 4 L continuous O2 supplementation. BNP this  admission 233.   Patient euvolemic on exam today. Continue Entresto , Torsemide . Resume Farxiga  when able.   Severe aortic stenosis s/p TAVR 08/23  Updated echo from August 2024 showed normal structure and function of aortic valve prosthesis without regurgitation or stenosis, mean gradient 6 mmHg.   Continue serial imaging to monitor valve function  COPD  Per primary team.   Seizures  Per neurology. As noted above, will stop Amiodarone  to reduce interaction potential.   For questions or updates, please contact [facility] Please consult [email or web address] for contact info under    Signed, Artist Pouch, PA-C   ADDENDUM:   Patient seen and examined with APP, Artist Pouch.  I personally taken a history, examined the patient, reviewed relevant notes,  laboratory data / imaging studies.  I performed a substantive portion of this encounter and formulated the important aspects of the plan.  I agree with the APP's note, impression, and recommendations; however, I have edited the note to reflect changes or salient points.  At the time of the encounter patient was agitated and not participating in the HPI. Pleasantly confused. Wife, daughter, son and daughter-in-law at bedside  Cardiology was asked to evaluate the patient given the A-fib with RVR and EKG changes.  PHYSICAL EXAM: Today's Vitals   04/07/23 0800 04/07/23 1127 04/07/23 1200 04/07/23 1215  BP:    (!) 157/72  Pulse:  94  99  Resp:      Temp:  98.1 F (36.7 C)    TempSrc:  Oral    SpO2:  93%    Weight:      Height:      PainSc: 0-No pain  0-No pain    Body mass index is 26.79 kg/m.   Net IO Since Admission: -1,836.05 mL [04/07/23 1426]  Filed Weights   04/05/23 2100 04/05/23 2151 04/06/23 0430  Weight: 75.5 kg 75.3 kg 75.3 kg    Physical Exam  Constitutional: He appears chronically ill.  hemodynamically stable, appears older than stated age  Neck: No JVD present.  Cardiovascular: Normal rate, regular rhythm, S1 normal and S2 normal. Exam reveals no gallop, no S3 and no S4.  No murmur heard. Pulmonary/Chest: No stridor. He has no wheezes. He has no rales.  Poor inspiratory effort, decreased breath sounds at the bases.  On nasal cannula oxygen .  Abdominal: Soft. Bowel sounds are normal. He exhibits no distension. There is no abdominal tenderness.  Musculoskeletal:        General: No edema.  Neurological:  Awake, at times responds to simple commands, moving all 4 extremities,   Skin: Skin is warm.   EKG: (personally  reviewed by me) 04/06/2023: Atrial fibrillation with rapid ventricular rate 159 bpm, ST depression in the inferolateral leads consider ischemia versus rate related changes.  04/06/2023: Sinus rhythm, 96 bpm, PACs, ST depression in the inferolateral leads have improved compared to prior tracing, prolonged QT.  Telemetry: (personally reviewed by me) Sinus rhythm/sinus tachycardia   Impression and recommendations:  Atrial fibrillation with rapid ventricular rate. Persistent atrial fibrillation Status post cardioversion times 21/09/2023 Based on EMR-she presented to the hospital with altered mental status and seizure-like activity at home.  While in the ED he went into A-fib with RVR requiring direct-current cardioversion x 2 and was subsequently started on IV heparin  and amiodarone  drips. Since then he has remained in normal sinus rhythm/sinus tachycardia. Given the recent cardioversion patient remains on anticoagulation, however IV heparin  has been held due to tentative lumbar puncture by  neurology.  Closer to  discharge can be transitioned to DOAC. Will discontinue amiodarone  for now given his history of lung cancer and chronic respiratory failure on home O2. Continue to uptitrate beta-blocker therapy.  Will transition Toprol -XL to Lopressor  as it is easier to titrate. Echocardiogram pending. Atrial fibrillation during this hospitalization likely precipitated by confusion, seizure-like activities (POA), other conditions.  Coronary artery disease: Patient was not endorsing any anginal discomfort prior to this event. EKG: Independently reviewed as noted above.  Will repeat EKG as he is in sinus today. High sensitive troponins remain negative x 2 Continue to monitor risk factors.  HFmrEF:  Medications were down titrated as outpatient due to soft blood pressures. Will focus on the acute illnesses/problem list and uptitrate medical therapy as hemodynamics and laboratory values allow.  History of  aortic stenosis status post TAVR: Normal valve function as of echo 10/2022.  Altered mental status: Currently being addressed by neurology and primary team. Tentatively scheduled for lumbar puncture.  Spoke with family who was present at bedside their questions and concerns addressed to their satisfaction.  Further recommendations to follow as the case evolves.   This note was created using a voice recognition software as a result there may be grammatical errors inadvertently enclosed that do not reflect the nature of this encounter. Every attempt is made to correct such errors.   Madonna Large, DO, [facility]  [facility]  [address] [place], [place] [address] Pager: [phone] Office: [phone] 04/07/2023 2:26 PM

## 2023-04-07 NOTE — Progress Notes (Signed)
 PROGRESS NOTE   Curtis Campos  FMW:[id number]    DOB: Jan 24, 1941    DOA: 04/05/2023  PCP: [name], MD   I have briefly reviewed patients previous medical records in [facility].  Chief Complaint  Patient presents with   Altered Mental Status    Brief Hospital Course:   83 y.o. male with hx of stage II lung adenocarcinoma currently planned for upcoming radiation therapy, remote history of rectal cancer status post colostomy, aortic stenosis status post bioprosthetic TAVR, coronary artery disease, chronic systolic heart failure, hypertension, hyperlipidemia, COPD, chronic hypoxic respiratory failure on 4 L O2 at baseline, HLD (with statin intolerance), paroxysmal atrial fibrillation and chronic hyponatremia, who was brought in from home initially to [facility] ED due to altered mental status followed by what appeared to be possible seizure-like episode.  Family noted that patient was confused, was not making sense, unable to answer questions and while transporting him to ED, noted that he stiffened up.  In the ED he had witnessed generalized tonic-clonic seizures aborted with Ativan  2 mg.  Evaluated by neurology for seizures and Cardiology for A-fib with RVR and transferred to [facility] for LTM EEG and further management.   Assessment & Plan:  Principal Problem:   Seizure (HCC) Active Problems:   Witnessed seizure-like activity (HCC)   New onset epilepsy  Presented with altered mental status, focal seizures followed by generalized seizures Neurology consultation and follow-up appreciated. MRI brain without acute findings and specifically no evidence of metastatic disease. LTM EEG: Study showed evidence of [place] cortical dysfunction arising from left hemisphere, maximal in frontotemporal region.  Additionally moderate diffuse encephalopathy.  No definitive seizures were seen. Given underlying stage II lung adenocarcinoma, neurology are obtaining LP under fluoroscopy guidance to rule out  leptomeningeal carcinomatous spread.  Holding IV heparin  for same. Loaded with Keppra  1.5 g and continued on 500 mg IV twice daily. As needed IV Ativan  for seizures Seizure precautions and patient needs to be advised regarding no driving and other limitations, at time of discharge.  Acute metabolic encephalopathy This may all be postictal state from recent seizures and therapy for same. Acute respiratory failure with higher than home need of oxygen  could certainly contribute. No clear infectious cause noted.  Procalcitonin negative.  UA negative. B12: 668.  TSH normal Avoid sedatives and opioids.  Delirium precautions.  Paroxysmal atrial fibrillation with RVR At [facility] ED, noted to be RVR with diffuse ST segment depressions. Cardiology consultation and follow-up appreciated. After a dose of IV Cardizem  bolus, IV Cardizem  drip was not initiated.  Started on IV amiodarone .  Not on AC PTA, now on IV heparin  Reverted to NSR Per cardiology, given underlying lung disease and potential interaction of amiodarone  with AEDs, stopped amiodarone  Beta-blockers consolidated to metoprolol  succinate 25 Mg daily. Post LP, plans to switch over to DOAC.  Acute on chronic respiratory failure with hypoxia On home oxygen  4 L/min via [place] at baseline Currently on 10 L/min HFNC. Chest x-ray 1/7: Interval increased patchy consolidation in left lower lung concerning for pneumonia or aspiration (however procalcitonin negative), small left pleural effusion, emphysematous changes and biapical scarring and known left upper lobe mass Influenza, RSV and SARS coronavirus 2 by RT-PCR negative. Acute respiratory failure probably multifactorial due to mental status change,?  Aspiration, complicating underlying advanced COPD, lung scaring and lung mass. Will not start antibiotics.  Clinically euvolemic, did receive IV Lasix  yesterday. Attempt to wean down to prior home level as tolerated.  Chronic combined  systolic and  diastolic CHF Clinically appears euvolemic.  TTE 8/24 with LVEF 45-50% and grade 1 diastolic dysfunction On GDMT with Toprol -XL 25 Mg daily, Entresto  and torsemide  20 Mg daily. Cardiology following.  CAD No anginal symptoms. Currently on aspirin  81 Mg daily-Per cardiology to be discontinued once he started on DOAC. Resume Praluent  and Zetia  at discharge.   COPD No clinical bronchospasm. Continue Dulera  and as needed bronchodilator nebs.   Mixed hyperlipidemia Intolerant to statins continue outpatient praluent    Hypokalemia Replaced.  Magnesium  normal.   Stage II adenocarcinoma of lung Continue outpatient follow-up with oncology service.   Leukocytosis WBC has worsened from 18.1-23.9 Suspect stress demargination. Despite pneumonia reported on chest x-ray and he could have certainly have had an aspiration event, negative procalcitonin argues against pneumonia. Hold off antibiotics for now.  Follow CBC and chest x-ray in a.m. Aggressive pulmonary toilet.  Anemia of chronic disease Stable.  Severe aortic stenosis s/p TAVR 8/23 Outpatient follow-up with cardiology.  Body mass index is 26.79 kg/m.   DVT prophylaxis: SCDs Start: 04/06/23 0307     Code Status: Full Code:  Family Communication: None at bedside Disposition:  Status is: Inpatient Remains inpatient appropriate because: Remains critically ill on HFNC at 10 L/min, amiodarone  drip chest pain transition off, awaiting LP to rule out leptomeningeal cancers.,  Remains on IV Keppra .     Consultants:   Neurology Cardiology IR  Procedures:   LTM EEG  Antimicrobials:      Subjective:  Seen this morning.  EEG technician at bedside.  Patient alert but oriented only to self.  Able to follow occasional simple instructions.  Denies any specific complaints including pain.  Objective:   Vitals:   04/06/23 1610 04/06/23 1938 04/07/23 0726 04/07/23 1127  BP: 128/80  (!) 143/92   Pulse: 85  94 94  Resp: 18  16    Temp: 98.7 F (37.1 C)  98.7 F (37.1 C) 98.1 F (36.7 C)  TempSrc: Oral  Oral Oral  SpO2:  (!) 82% 100% 93%  Weight:      Height:        General exam: Elderly male, moderately built and nourished lying comfortably propped up in bed without distress. Respiratory system: Globally diminished breath sounds but no wheezing, rhonchi.  Occasional basal crackles.  No increased work of breathing.  On HFNC at 10 L/min. Cardiovascular system: S1 & S2 heard, RRR. No JVD, murmurs, rubs, gallops or clicks. No pedal edema.  Telemetry personally reviewed: Sinus rhythm. Gastrointestinal system: Abdomen is nondistended, soft and nontender. No organomegaly or masses felt. Normal bowel sounds heard. Central nervous system: Alert and oriented only to self. No focal neurological deficits. Extremities: Symmetric 5 x 5 power. Skin: No rashes, lesions or ulcers Psychiatry: Judgement and insight impaired. Mood & affect appropriate.     Data Reviewed:   I have personally reviewed following labs and imaging studies   CBC: Recent Labs  Lab 04/05/23 1902 04/06/23 0448 04/07/23 0630  WBC 18.6* 18.1* 23.9*  HGB 8.8* 8.8* 8.8*  HCT 29.0* 27.7* 27.3*  MCV 86.3 86.0 84.0  PLT 574* 574* 586*    Basic Metabolic Panel: Recent Labs  Lab 04/05/23 1902 04/06/23 0448 04/07/23 0536  NA 131* 133* 133*  K 3.4* 3.3* 3.9  CL 93* 95* 90*  CO2 27 26 24   GLUCOSE 166* 114* 94  BUN 14 15 18   CREATININE 0.77 0.75 0.74  CALCIUM 8.7* 8.7* 8.6*  MG 2.1 2.2 2.0  PHOS  --  3.3  --     Liver Function Tests: Recent Labs  Lab 04/05/23 1902  AST 13*  ALT 12  ALKPHOS 127*  BILITOT 0.6  PROT 7.5  ALBUMIN 2.6*    CBG: No results for input(s): GLUCAP in the last 168 hours.  Microbiology Studies:   Recent Results (from the past 240 hours)  Resp panel by RT-PCR (RSV, Flu A&B, Covid) Anterior Nasal Swab     Status: None   Collection Time: 04/05/23  7:51 PM   Specimen: Anterior Nasal Swab  Result Value  Ref Range Status   SARS Coronavirus 2 by RT PCR NEGATIVE NEGATIVE Final    Comment: (NOTE) SARS-CoV-2 target nucleic acids are NOT DETECTED.  The SARS-CoV-2 RNA is generally detectable in upper respiratory specimens during the acute phase of infection. The lowest concentration of SARS-CoV-2 viral copies this assay can detect is 138 copies/mL. A negative result does not preclude SARS-Cov-2 infection and should not be used as the sole basis for treatment or other patient management decisions. A negative result may occur with  improper specimen collection/handling, submission of specimen other than nasopharyngeal swab, presence of viral mutation(s) within the areas targeted by this assay, and inadequate number of viral copies(<138 copies/mL). A negative result must be combined with clinical observations, patient history, and epidemiological information. The expected result is Negative.  Fact Sheet for Patients:  bloggercourse.com  Fact Sheet for Healthcare Providers:  seriousbroker.it  This test is no t yet approved or cleared by the [place]  FDA and  has been authorized for detection and/or diagnosis of SARS-CoV-2 by FDA under an Emergency Use Authorization (EUA). This EUA will remain  in effect (meaning this test can be used) for the duration of the COVID-19 declaration under Section 564(b)(1) of the Act, 21 U.S.C.section 360bbb-3(b)(1), unless the authorization is terminated  or revoked sooner.       Influenza A by PCR NEGATIVE NEGATIVE Final   Influenza B by PCR NEGATIVE NEGATIVE Final    Comment: (NOTE) The Xpert Xpress SARS-CoV-2/FLU/RSV plus assay is intended as an aid in the diagnosis of influenza from Nasopharyngeal swab specimens and should not be used as a sole basis for treatment. Nasal washings and aspirates are unacceptable for Xpert Xpress SARS-CoV-2/FLU/RSV testing.  Fact Sheet for  Patients: bloggercourse.com  Fact Sheet for Healthcare Providers: seriousbroker.it  This test is not yet approved or cleared by the [place]  FDA and has been authorized for detection and/or diagnosis of SARS-CoV-2 by FDA under an Emergency Use Authorization (EUA). This EUA will remain in effect (meaning this test can be used) for the duration of the COVID-19 declaration under Section 564(b)(1) of the Act, 21 U.S.C. section 360bbb-3(b)(1), unless the authorization is terminated or revoked.     Resp Syncytial Virus by PCR NEGATIVE NEGATIVE Final    Comment: (NOTE) Fact Sheet for Patients: bloggercourse.com  Fact Sheet for Healthcare Providers: seriousbroker.it  This test is not yet approved or cleared by the [place]  FDA and has been authorized for detection and/or diagnosis of SARS-CoV-2 by FDA under an Emergency Use Authorization (EUA). This EUA will remain in effect (meaning this test can be used) for the duration of the COVID-19 declaration under Section 564(b)(1) of the Act, 21 U.S.C. section 360bbb-3(b)(1), unless the authorization is terminated or revoked.  Performed at [facility], [address] [place], [place] [address]     Radiology Studies:  Overnight EEG with video Result Date: 04/07/2023 Shelton Arlin KIDD, MD  04/07/2023  9:09 AM Patient Name: Curtis Campos MRN: [id number] Epilepsy Attending: Arlin MALVA Krebs Referring Physician/Provider: Voncile Isles, MD Duration: 04/06/2023 2041 to 04/07/2023 0900  Patient history: 83 year old male with stage II lung adenocarcinoma plans for radiation who presented with altered mental status and proceeded to have focal seizures which then generalized. EEG to evaluate for seizure  Level of alertness: Awake, asleep  AEDs during EEG study: LEV  Technical aspects: This EEG study was done with scalp electrodes positioned according  to the 10-20 International system of electrode placement. Electrical activity was reviewed with band pass filter of 1-70Hz , sensitivity of 7 uV/mm, display speed of 44mm/sec with a 60Hz  notched filter applied as appropriate. EEG data were recorded continuously and digitally stored.  Video monitoring was available and reviewed as appropriate.  Description: No clear posterior dominant rhythm was seen. Sleep was characterized by sleep spindles (12 to 14 Hz), maximal frontocentral region. EEG showed 3-5hz  theta-delta slowing with overriding 13 to 15 Hz beta activity. Sharp waves were noted in the hemisphere, maximal left frontal temporal region. Hyperventilation and photic stimulation were not performed.    ABNORMALITY - Sharp waves, left hemisphere, maximal left frontotemporal region - Continuous slow, generalized and lateralized left hemisphere  IMPRESSION: This study showed evidence of epileptogenicity and cortical dysfunction arising from left hemisphere, maximal in frontotemporal region. Additionally there is moderate diffuse encephalopathy.  No definite seizures were seen during this time.  Arlin MALVA Krebs   EEG adult Result Date: 04/06/2023 Krebs Arlin MALVA, MD     04/06/2023  2:24 PM Patient Name: REEDY BIERNAT MRN: [id number] Epilepsy Attending: Arlin MALVA Krebs Referring Physician/Provider: Keturah Carrier, MD Date: 04/06/2023 Duration: 22.10 mins Patient history: 83 year old male with stage II lung adenocarcinoma plans for radiation who presented with altered mental status and proceeded to have focal seizures which then generalized. EEG to evaluate for seizure Level of alertness: Awake, asleep AEDs during EEG study: LEV, Ativan  Technical aspects: This EEG study was done with scalp electrodes positioned according to the 10-20 International system of electrode placement. Electrical activity was reviewed with band pass filter of 1-70Hz , sensitivity of 7 uV/mm, display speed of 13mm/sec with a 60Hz  notched  filter applied as appropriate. EEG data were recorded continuously and digitally stored.  Video monitoring was available and reviewed as appropriate. Description: No clear posterior dominant rhythm was seen. Sleep was characterized by sleep spindles (12 to 14 Hz), maximal frontocentral region. EEG showed 3-5hz  theta-delta slowing with overriding 13 to 15 Hz beta activity. Sharp waves were noted in the hemisphere, maximal left frontal temporal region, at times quasi periodic at 1 Hz.  Hyperventilation and photic stimulation were not performed.   ABNORMALITY - Sharp waves, left hemisphere, maximal left frontotemporal region - Continuous slow, generalized and lateralized left hemisphere IMPRESSION: This study showed evidence of epileptogenicity and cortical dysfunction arising from left hemisphere, maximal in frontotemporal region.  Additionally there is moderate diffuse encephalopathy.  No definite seizures were seen during this time. Recommend long-term EEG for further evaluation. Priyanka O Yadav   MR BRAIN W WO CONTRAST Result Date: 04/06/2023 CLINICAL DATA:  New onset seizure. History of rectal cancer. History of lung cancer. EXAM: MRI HEAD WITHOUT AND WITH CONTRAST TECHNIQUE: Multiplanar, multiecho pulse sequences of the brain and surrounding structures were obtained without and with intravenous contrast. CONTRAST:  7mL GADAVIST  GADOBUTROL  1 MMOL/ML IV SOLN COMPARISON:  Head CT 04/05/2023.  MRI 03/16/2023. FINDINGS: Brain: Diffusion imaging does not show any acute or subacute infarction  or other cause of restricted diffusion. Chronic small-vessel ischemic changes affect the pons. No focal cerebellar insult. Cerebral hemispheres show advanced chronic small-vessel ischemic changes of the deep and subcortical white matter. Chronic susceptibility artifact in the right occipital lobe previously felt secondary to a small cavernoma and developmental venous anomaly. No change in this appearance. There is no evidence of  neoplastic mass lesion, hemorrhage, hydrocephalus or extra-axial collection. No abnormal brain or leptomeningeal enhancement occurs. Vascular: Major vessels at the base of the brain show flow. Skull and upper cervical spine: Negative Sinuses/Orbits: Clear/normal Other: None IMPRESSION: 1. No acute or reversible finding. No evidence of metastatic disease. 2. Advanced chronic small-vessel ischemic changes of the pons and cerebral hemispheric white matter. 3. Chronic susceptibility artifact in the right occipital lobe previously felt secondary to a small cavernoma and developmental venous anomaly. No change in this appearance. Electronically Signed   By: Oneil Officer M.D.   On: 04/06/2023 09:40   DG CHEST PORT 1 VIEW Result Date: 04/06/2023 CLINICAL DATA:  Lung cancer patient presents with hypoxia. [id number]. EXAM: PORTABLE CHEST 1 VIEW COMPARISON:  Portable chest yesterday at 8:02 p.m. FINDINGS: Interval increased patchy consolidation in the left lower lung field concerning for pneumonia or aspiration. Emphysematous changes and biapical scarring with nearly 5 cm left upper lobe mass are again noted. Remaining lungs are clear. The cardiac size is normal. A TAVR is again noted with aortic tortuosity and calcifications, stable mediastinum. There is suspected a small left pleural effusion as well. No new osseous findings. IMPRESSION: 1. Interval increased patchy consolidation in the left lower lung field concerning for pneumonia or aspiration. 2. Suspected small left pleural effusion. 3. Emphysematous changes and biapical scarring with nearly 5 cm known left upper lobe mass. Electronically Signed   By: Francis Quam M.D.   On: 04/06/2023 05:10   DG Chest Port 1 View Result Date: 04/05/2023 CLINICAL DATA:  Chest pain EXAM: PORTABLE CHEST 1 VIEW COMPARISON:  03/06/2023 FINDINGS: Again seen is the left upper lobe mass compatible with known primary lung cancer. Patchy opacities in the right upper lobe are unchanged. This  likely reflects scarring as seen on prior chest CT. No acute confluent opacities or effusions. Heart is normal size. No acute bony abnormality. IMPRESSION: Left upper lobe mass again noted as seen on prior imaging compatible with known lung cancer. No acute cardiopulmonary disease. Electronically Signed   By: Franky Crease M.D.   On: 04/05/2023 20:21   CT Head Wo Contrast Result Date: 04/05/2023 CLINICAL DATA:  Headache EXAM: CT HEAD WITHOUT CONTRAST TECHNIQUE: Contiguous axial images were obtained from the base of the skull through the vertex without intravenous contrast. RADIATION DOSE REDUCTION: This exam was performed according to the departmental dose-optimization program which includes automated exposure control, adjustment of the mA and/or kV according to patient size and/or use of iterative reconstruction technique. COMPARISON:  Head CT 01/18/2023.  MRI brain 01/14/2023. FINDINGS: Brain: No evidence of acute infarction, hemorrhage, hydrocephalus, extra-axial collection or mass lesion/mass effect. Again seen is mild diffuse atrophy. There is extensive periventricular and deep white matter hypodensity which is similar to the prior examination. Vascular: Atherosclerotic calcifications are present within the cavernous internal carotid arteries. Skull: Normal. Negative for fracture or focal lesion. Sinuses/Orbits: No acute finding. Other: None. IMPRESSION: 1. No acute intracranial process. 2. Stable atrophy and chronic small vessel ischemic changes. Electronically Signed   By: Greig Pique M.D.   On: 04/05/2023 20:14    Scheduled Meds:  aspirin   81 mg Oral QHS   Chlorhexidine  Gluconate Cloth  6 each Topical Daily   levETIRAcetam   500 mg Oral Q12H   metoprolol  succinate  25 mg Oral Daily   mometasone -formoterol   2 puff Inhalation BID   sacubitril -valsartan   1 tablet Oral BID   sodium chloride  flush  3 mL Intravenous Q12H   torsemide   20 mg Oral Daily    Continuous Infusions:    levETIRAcetam   Stopped (04/06/23 1134)     LOS: 1 day     Trenda Mar, MD,  FACP, [facility], [facility], [facility]   [facility] [place]      To contact the attending provider between 7A-7P or the covering provider during after hours 7P-7A, please log into the web site [email or web address] and access using universal  password for that web site. If you do not have the password, please call the hospital operator.  04/07/2023, 11:36 AM

## 2023-04-07 NOTE — Plan of Care (Addendum)
 Notified of EEG changes Rhythmic, sharply contoured with PLED like morphology Vimpat  added, 100 mg PO BID; no evidence of heart block on ECG confirmed   Lola Jernigan MD-PhD [facility] [phone]   Addendum: further concern for runs of discharges 200 mg IV Vimpat  x 1 dose  Lola Jernigan MD-PhD [facility] [phone] Available 7 PM to 7 AM, outside of these hours please call Neurologist on call as listed on Amion.

## 2023-04-07 NOTE — Progress Notes (Signed)
 Brief Pharmacy Antibiotic Note   Per discussion with Neurology, adding Acyclovir 10mg /kg IV q8h + NS at 79ml/hr for renal protection. Will continue to monitor renal function closely.  Loralee Pacas, PharmD, BCPS 04/07/2023 3:54 PM

## 2023-04-07 NOTE — Progress Notes (Signed)
 Pharmacy Antibiotic Note  Curtis Campos is a 83 y.o. male admitted on 04/05/2023 with meningitis.  Pharmacy has been consulted for vanc/ceftriaxone /ampicillin  dosing.  Pt with lung CA who was admitted for AMS and seizure. WBC trending up. Pending r/o for meningitis. LP is being scheduled. Empiric abx ordered to avoid delay. Will use trough based dosing for vanc.   Scr<1  Plan: Vanc 1g IV q12 Ampicillin  2g IV q4 Ceftriaxone  2g IV q12 Level as needed  Height: 5' 6 (167.6 cm) Weight: 75.3 kg (166 lb 0.1 oz) IBW/kg (Calculated) : 63.8  Temp (24hrs), Avg:98.5 F (36.9 C), Min:98.1 F (36.7 C), Max:98.7 F (37.1 C)  Recent Labs  Lab 04/05/23 1902 04/06/23 0448 04/07/23 0536 04/07/23 0630  WBC 18.6* 18.1*  --  23.9*  CREATININE 0.77 0.75 0.74  --     Estimated Creatinine Clearance: 64.2 mL/min (by C-G formula based on SCr of 0.74 mg/dL).    Allergies  Allergen Reactions   Codeine Nausea Only   Statins Other (See Comments)    REACTION: myalgias    Antimicrobials this admission: 1/8 vanc>> 1/8 ceftriaxone >> 1/8 ampicillin >>  Dose adjustments this admission:   Microbiology results:    Sergio Batch, PharmD, BCIDP, AAHIVP, CPP Infectious Disease Pharmacist 04/07/2023 2:37 PM

## 2023-04-07 NOTE — Progress Notes (Addendum)
 NEUROLOGY CONSULT FOLLOW UP NOTE   Date of service: April 07, 2023 Patient Name: Curtis Campos MRN:  [id number] DOB:  Mar 01, 1941  Brief HPI  83 year old male with stage II lung adenocarcinoma plans for radiation who presented with altered mental status and proceeded to have focal seizures which then generalized.   Etiology of new onset seizures is unclear.  He was started on Keppra  500 mg twice daily and continues EEG was obtained which demonstrated a left frontotemporal sharp waves.  Workup with MRI [place] with and without contrast with noted chronic right occipital susceptibility artifact, no clear abnormality noted in the left frontotemporal region specifically, no leptomeningeal enhancement or T2 flair hyperintensity or stroke.   Interval Hx/subjective   WBC count continues to trend up, intermittently following 1 step simple commands. Hallucinating and agitated. Tells me he won a lottery.  Vitals   Vitals:   04/06/23 1938 04/07/23 0726 04/07/23 1127 04/07/23 1215  BP:  (!) 143/92  (!) 157/72  Pulse:  94 94 99  Resp:  16    Temp:  98.7 F (37.1 C) 98.1 F (36.7 C)   TempSrc:  Oral Oral   SpO2: (!) 82% 100% 93%   Weight:      Height:         Body mass index is 26.79 kg/m.  Physical Exam   General: agitated in bed, somnolent but talking to himself; in no acute distress.  HENT: EEG leads in place, no obvious skin tear, normal oropharynx and mucosa. Normal external appearance of ears and nose.  Neck: Supple, no pain or tenderness  CV: No JVD. No peripheral edema.  Pulmonary: Symmetric Chest rise. Normal respiratory effort.  Abdomen: Soft to touch, non-tender.  Ext: No cyanosis, edema, or deformity  Skin: No rash. Normal palpation of skin.   Musculoskeletal: Normal digits and nails by inspection. No clubbing.   Neurologic Examination  Mental status/Cognition: somnolent, initially not redirectable but later on was answering 1 step questions intermittently, oriented to  self, month but not to place and year. Cognitive eval limited by encephalopathy and somnolence. Speech/language: slight dysarthric, at times speech is hard to understand and unclear if the speech is coherent vs dysarthric. comprehension intact to simple commands, names fingers. Cranial nerves:   CN II Pupils equal and reactive to light, makes eye contact on left and right   CN III,IV,VI EOM intact, no gaze preference or deviation, no nystagmus   CN V normal sensation in V1, V2, and V3 segments bilaterally   CN VII no asymmetry, no nasolabial fold flattening   CN VIII Makes eye contact to speech   CN IX & X normal palatal elevation, no uvular deviation   CN XI Head midline.   CN XII midline tongue protrusion   Neck: No nuchal rigidity  Motor:  Muscle bulk: normal, tone normal. Moves all extremities spontaneously and antigravity with no focal weakness noted on exam.  Coordination/Complex Motor:  - Finger to Nose roughly intact, difficult to do with his encephlopathy - Heel to shin not attempted due to encephalopathy. - Gait: deferred for patient safety.   Medications  Current Facility-Administered Medications:    aspirin  chewable tablet 81 mg, 81 mg, Oral, QHS, Segars, Dorn, MD, 81 mg at 04/06/23 2109   Chlorhexidine  Gluconate Cloth 2 % PADS 6 each, 6 each, Topical, Daily, Hongalgi, Anand D, MD, 6 each at 04/07/23 1056   heparin  ADULT infusion 100 units/mL (25000 units/250mL), 1,450 Units/hr, Intravenous, Continuous, Pham, Minh Q, RPH-CPP  ipratropium-albuterol  (DUONEB) 0.5-2.5 (3) MG/3ML nebulizer solution 3 mL, 3 mL, Nebulization, Q4H PRN, Hongalgi, Trenda BIRCH, MD   LORazepam  (ATIVAN ) injection 2 mg, 2 mg, Intravenous, Q5 min PRN, Segars, Dorn, MD   metoprolol  succinate (TOPROL -XL) 24 hr tablet 25 mg, 25 mg, Oral, Daily, Williams, Evan, PA-C, 25 mg at 04/07/23 1215   mometasone -formoterol  (DULERA ) 200-5 MCG/ACT inhaler 2 puff, 2 puff, Inhalation, BID, Segars, Dorn, MD, 2  puff at 04/07/23 0759   ondansetron  (ZOFRAN ) injection 4 mg, 4 mg, Intravenous, Q6H PRN, Segars, Jonathan, MD   sacubitril -valsartan  (ENTRESTO ) 24-26 mg per tablet, 1 tablet, Oral, BID, Lenon Elsie HERO, [facility], 1 tablet at 04/07/23 1054   sodium chloride  flush (NS) 0.9 % injection 3 mL, 3 mL, Intravenous, Q12H, Segars, Dorn, MD, 3 mL at 04/07/23 1055   torsemide  (DEMADEX ) tablet 20 mg, 20 mg, Oral, Daily, Ricky Fines, MD, 20 mg at 04/07/23 1054   valproate (DEPACON ) 1,000 mg in dextrose  5 % 50 mL IVPB, 1,000 mg, Intravenous, Once, Tymeer Vaquera, MD   valproate (DEPACON ) 250 mg in dextrose  5 % 50 mL IVPB, 250 mg, Intravenous, Q6H, Pricila Bridge, MD [facility]   CBC:  Recent Labs  Lab 04/06/23 0448 04/07/23 0630  WBC 18.1* 23.9*  HGB 8.8* 8.8*  HCT 27.7* 27.3*  MCV 86.0 84.0  PLT 574* 586*    Basic Metabolic Panel:  Lab Results  Component Value Date   NA 133 (L) 04/07/2023   K 3.9 04/07/2023   CO2 24 04/07/2023   GLUCOSE 94 04/07/2023   BUN 18 04/07/2023   CREATININE 0.74 04/07/2023   CALCIUM 8.6 (L) 04/07/2023   GFRNONAA >60 04/07/2023   GFRAA 79 04/04/2020   Lipid Panel:  Lab Results  Component Value Date   LDLCALC 106 (H) 02/12/2020   HgbA1c:  Lab Results  Component Value Date   HGBA1C 6.3 02/12/2020   Urine Drug Screen: No results found for: LABOPIA, COCAINSCRNUR, LABBENZ, AMPHETMU, THCU, LABBARB  Alcohol  Level No results found for: [facility] INR  Lab Results  Component Value Date   INR 1.2 04/05/2023   APTT No results found for: APTT AED levels: No results found for: PHENYTOIN, ZONISAMIDE, LAMOTRIGINE, LEVETIRACETA  CT Head without contrast(Personally reviewed): 1. No acute intracranial process. 2. Stable atrophy and chronic small vessel ischemic changes.  MRI Brain(Personally reviewed): 1. No acute or reversible finding. No evidence of metastatic disease. 2. Advanced chronic small-vessel ischemic  changes of the pons and cerebral hemispheric white matter. 3. Chronic susceptibility artifact in the right occipital lobe previously felt secondary to a small cavernoma and developmental venous anomaly. No change in this appearance.  cEEG:  This study showed evidence of epileptogenicity and cortical dysfunction arising from left hemisphere, maximal in frontotemporal region. Additionally there is moderate diffuse encephalopathy. No definite seizures were seen during this time.   Assessment   Curtis Campos is a 83 y.o. male with stage II lung adenocarcinoma plans for radiation who presented with altered mental status and proceeded to have focal seizures which then generalized. Etiology of new onset seizures is unclear.  He was started on Keppra  500 mg twice daily and continues EEG was obtained which demonstrated left frontotemporal sharp waves.  Workup with MRI [facility] with and without contrast with noted chronic right occipital susceptibility artifact, no clear abnormality noted in the left frontotemporal region specifically, no leptomeningeal enhancement or T2 flair hyperintensity or stroke. His WBC count is uptrending.  Differential for his continued confusion and new onset  seizures broad including potential meningitis, leptomeningeal spread of his cancer, paraneoplastic process, new onset seizure in the setting of possible dementia.  Recommendations  - Unable to do LP at bedside due to confusion. Will need Fluoro LP under sedation. - Will resume his Heparin  gtt since we cannot do LP at bedside. I reached out to Dr. Judeth who has reached out to pharmacy. Will need to be held for fluoro LP again. - LP with CSF cell count and differential x 2, protein and glucose, CSF Gram stain and cultures, meningitis/encephalitis panel, CSF cytology, and CSF and serum paraneoplastic panel. - Empiric meningits coverage with Vanc, ceftriaxone , ampicillin  and acyclovir  due to confusion and uptrending WBC and  new onset seizures. - will stop Keppra  and switch him to Valproic  acid for noted hallucinations. Will load with 1000mg  IV once and start him on 250mg  Q6H. Will get VPA and Ammonia levels tomorrow AM. - Thiamine  and folate levels. Will start him on thiamine  100mg  IV or PO daily.  ______________________________________________________________________  Plan discussed with family at bedside and with Dr. Judeth in person.  Signed, Norma Montemurro, MD [facility]

## 2023-04-07 NOTE — Consult Note (Signed)
 Chief Complaint: Patient was seen in consultation today for  Chief Complaint  Patient presents with   Altered Mental Status     Referring Physician(s): Dr. Vanessa  Supervising Physician: Alona Corners  Patient Status: [facility] - In-pt  History of Present Illness: Curtis Campos is a 83 y.o. male with hx of lung cancer presented with altered mental status and seizures. Etiology unclear at this time. Neurology workup ongoing. Plan was for LP to be performed but has become more agitated, not sitting still in bed, trying to get up. Neurology team feels LP would best be performed under Fluoro and sedation. IR is consulted for this.  Family at bedside, pt just ate some lunch but has become more agitated.  PMHx, meds, labs, imaging, allergies reviewed.  Also presented with a.fib and RVR, Converted to NSR after dose of Cardizem  and started on amiodarone .  Currently on IV heparin  drip.  Family at bedside.   Past Medical History:  Diagnosis Date   Agent orange exposure    Anemia    Anxiety    Aortic atherosclerosis (HCC)    Aortic stenosis 07/04/2013   a.)TTE 07/04/13: mild (MPG 12; AVA 1.6); b.) TTE 01/01/16: mod (MPG 14; AVA 1.17); c.) TTE 08/30/17: mod (MPG 15; AVA 1.25); d.) TTE 07/13/19: mod (MPG 19; AVA 1.16); e.) TTE 07/23/20: mod (MPG 22; AVA 1.25); f.) TTE 03/12/2021: mod-sev (MPH 34; AVA 0.75); g.) TTE 05/21/2021: mod (MPG 22; AVA 0.8); h.) RHC 08/27/2020: sev (MPG 26.18; AVA 0.69); i.) s/p TAVR 11/25/21: 26 mm Edwards S3 Ultra Resilia   Arthritis 03/12/2011   Cardiomegaly    Cerebral microvascular disease    Cholelithiasis    Chronic hyponatremia    Chronic obstructive lung disease (HCC)    Colostomy in place [facility])    a.) s/p resection secondary to rectal cancer   Coronary artery disease 03/2013   a.) LHC 04/10/2013: 10% mLAD, 50% D1, 30% D2, 99% mLCx, 50% OM1, 40% pRCA, 30% mRCA, 60% RPDA, 40% RPLS - med mgmt; b.) MV 08/31/2019: basal inflat/mid inflat defect  c/w ischemia in LCx distribution. EF 37%; c.) R/LHC 08/27/2020: 50% D1, 50% D2, 90% mLCx, 40% p-mRCA, 80% RPDA - med mgmt   Former smoker    HFrEF (heart failure with reduced ejection fraction) (HCC)    a.) TEE 04/13/13: EF 20-25, glob HK, G1DD, mild LAE, mod-sev MR; b.) TTE 08/30/17: EF 45-50, LVH, mild LAE, G1DD, mod AS, mod MR; c.) TTE 07/13/19: EF 30-35, glob HK, G1DD, RVSP 49.4, mild LAE, mild-mod MR, mod AS; d.) TTE 03/12/21: EF 55, G2DD, mild MR, mod-sev AS; e.) TTE 12/24/21: EF 50, mild LVH, G1DD, RVSP 40.5, mild-mod MR, triv AR; f.) TTE 11/18/22: EF 45-50, glob HK, G1DD, mild MR   History of bilateral cataract extraction    Hyperlipidemia    Hypertension    Hypokalemia    Ischemic cardiomyopathy    a.) TTE 04/08/2014: EF 25-30%; b.) TEE 04/13/2013: EF 20-25%; c.) TTE 07/04/2013: EF 30-35%; d.) TTE 01/01/2016: EF 40-45%; e.) TTE 08/30/2017: EF 45-50%; f.) TTE 07/03/2019: EF 30-35%; g.) MV 08/31/2019: EF 37%; h.) TTE 07/23/20: EF 55-60%; i.) TTE 03/12/2021: EF 55%; j.) TTE 05/21/2021: EF 40-45%; k.) TTE 11/26/2021: EF 45-50%; l.) TTE 12/24/2021: EF 50%; m.) TTE 11/18/2022: EF 45-50%   Long term (current) use of aspirin     Mass of upper lobe of left lung 03/10/2021   a.) CTA chest 03/10/2021: 6.1 mm; b.) CTA chest 10/15/2021: 8 mm; c.) PET CT  11/14/2021: 8 mm (SUV max 2.8); d.) CXR 10/05/2022: interval increase to 2.7 cm; e.) CXR 01/18/2023: interval increase to 3.7 cm; f.) CT chest 01/22/2023: 3.8 x 3.2 cm; g.) PET CT 02/01/2023: 3.8 cm and markedly hypermetabolic with SUV max of 43   Multifocal atrial tachycardia (HCC)    Myocardial infarction [facility])    a.) suspected by cardiology during 03/2013 admission for CP/CAP/new onset CHF --> TTE  showed severe LV dysfunction with segmental wall motion abnormalities and severe MR (posteriorly-directed  jet)   Pneumonia    Pre-diabetes    Pulmonary hypertension (HCC)    a.) TTE 04/08/2013: RVSP 62.2; b.) TTE 07/13/2019: RVSP 49.4; c.) TTE 07/23/2020: RVSP  33.2; d.) TTE 11/26/2021: RVSP 41.2; e.) TTE 12/24/2021: RVSP 40.5; f.) TTE 11/18/2019: RVSP 29.8   Rectal cancer (HCC)    Requires continuous at home supplemental oxygen     a.) 4L/[place]   S/P TAVR (transcatheter aortic valve replacement) 11/25/2021   a.) 26mm Edwards Sapien 3 Ultra Resilia via the TF approach   Statin intolerance    Umbilical hernia (s/p repair)     Past Surgical History:  Procedure Laterality Date   APPENDECTOMY  06/29/2003   CATARACT EXTRACTION, BILATERAL Bilateral    COLOSTOMY     HERNIA REPAIR     INTRAOPERATIVE TRANSTHORACIC ECHOCARDIOGRAM N/A 11/25/2021   Procedure: INTRAOPERATIVE TRANSTHORACIC ECHOCARDIOGRAM;  Surgeon: Verlin Lonni BIRCH, MD;  Location: [facility] OR;  Service: Open Heart Surgery;  Laterality: N/A;   resection of rectum     RIGHT/LEFT HEART CATH AND CORONARY ANGIOGRAPHY Bilateral 04/10/2013   Procedure: RIGHT/LEFT HEART CATH AND CORONARY ANGIOGRAPHY; Location: [facility]; Surgeon: Deatrice Cage, MD   RIGHT/LEFT HEART CATH AND CORONARY ANGIOGRAPHY N/A 08/27/2021   Procedure: RIGHT/LEFT HEART CATH AND CORONARY ANGIOGRAPHY;  Surgeon: Wonda Sharper, MD;  Location: [facility] INVASIVE CV LAB;  Service: Cardiovascular;  Laterality: N/A;   TRANSCATHETER AORTIC VALVE REPLACEMENT, TRANSFEMORAL Left 11/25/2021   Procedure: Transcatheter Aortic Valve Replacement, Transfemoral using a 26 MM Edwards SAPIEN 3 Ultra.;  Surgeon: Verlin Lonni BIRCH, MD;  Location: [facility] OR;  Service: Open Heart Surgery;  Laterality: Left;   VIDEO BRONCHOSCOPY WITH ENDOBRONCHIAL ULTRASOUND N/A 03/03/2023   Procedure: VIDEO BRONCHOSCOPY WITH ENDOBRONCHIAL ULTRASOUND;  Surgeon: Parris Manna, MD;  Location: [facility] ORS;  Service: Thoracic;  Laterality: N/A;    Allergies: Codeine and Statins  Medications:  Current Facility-Administered Medications:    ampicillin  (OMNIPEN) 2 g in sodium chloride  0.9 % 100 mL IVPB, 2 g, Intravenous, Q4H, Pham, Minh Q, RPH-CPP   aspirin  chewable tablet 81 mg, 81  mg, Oral, QHS, Segars, Dorn, MD, 81 mg at 04/06/23 2109   cefTRIAXone  (ROCEPHIN ) 2 g in sodium chloride  0.9 % 100 mL IVPB, 2 g, Intravenous, Q12H, Pham, Minh Q, RPH-CPP   Chlorhexidine  Gluconate Cloth 2 % PADS 6 each, 6 each, Topical, Daily, Hongalgi, Anand D, MD, 6 each at 04/07/23 1056   heparin  ADULT infusion 100 units/mL (25000 units/250mL), 1,450 Units/hr, Intravenous, Continuous, Pham, Minh Q, RPH-CPP, Last Rate: 14.5 mL/hr at 04/07/23 1421, 1,450 Units/hr at 04/07/23 1421   ipratropium-albuterol  (DUONEB) 0.5-2.5 (3) MG/3ML nebulizer solution 3 mL, 3 mL, Nebulization, Q4H PRN, Hongalgi, Anand D, MD   LORazepam  (ATIVAN ) injection 2 mg, 2 mg, Intravenous, Q5 min PRN, Segars, Jonathan, MD   metoprolol  tartrate (LOPRESSOR ) tablet 25 mg, 25 mg, Oral, BID, Tolia, Sunit, DO   mometasone -formoterol  (DULERA ) 200-5 MCG/ACT inhaler 2 puff, 2 puff, Inhalation, BID, Segars, Jonathan, MD, 2 puff at 04/07/23 0759   ondansetron  (  ZOFRAN ) injection 4 mg, 4 mg, Intravenous, Q6H PRN, Segars, Jonathan, MD   sacubitril -valsartan  (ENTRESTO ) 24-26 mg per tablet, 1 tablet, Oral, BID, Lenon Elsie HERO, [facility], 1 tablet at 04/07/23 1054   sodium chloride  flush (NS) 0.9 % injection 3 mL, 3 mL, Intravenous, Q12H, Segars, Dorn, MD, 3 mL at 04/07/23 1055   thiamine  (VITAMIN B1) injection 100 mg, 100 mg, Intravenous, Daily **OR** thiamine  (VITAMIN B1) tablet 100 mg, 100 mg, Oral, Daily, Khaliqdina, Salman, MD, 100 mg at 04/07/23 1422   torsemide  (DEMADEX ) tablet 20 mg, 20 mg, Oral, Daily, Ricky Fines, MD, 20 mg at 04/07/23 1054   valproate (DEPACON ) 1,000 mg in dextrose  5 % 50 mL IVPB, 1,000 mg, Intravenous, Once, Khaliqdina, Salman, MD   valproate (DEPACON ) 250 mg in dextrose  5 % 50 mL IVPB, 250 mg, Intravenous, Q6H, Khaliqdina, Salman, MD   vancomycin  (VANCOCIN ) IVPB 1000 mg/200 mL premix, 1,000 mg, Intravenous, Q12H, Pham, Minh Q, RPH-CPP    Family History  Problem Relation Age of Onset   Heart disease  Mother    Heart disease Father    Hyperlipidemia Father    Hypertension Father    Heart disease Sister        stents placed    Cancer - Cervical Sister    Heart disease Sister        CABG   Heart attack Brother 71       MI    Social History   Socioeconomic History   Marital status: Married    Spouse name: Dorothyann   Number of children: 2   Years of education: Not on file   Highest education level: Not on file  Occupational History   Not on file  Tobacco Use   Smoking status: Former    Current packs/day: 0.00    Average packs/day: 1 pack/day for 50.0 years (50.0 ttl pk-yrs)    Types: Cigarettes    Start date: 03/26/1963    Quit date: 03/25/2013    Years since quitting: 10.0   Smokeless tobacco: Never  Vaping Use   Vaping status: Never Used  Substance and Sexual Activity   Alcohol  use: No    Alcohol /week: 0.0 standard drinks of alcohol    Drug use: No   Sexual activity: Not Currently  Other Topics Concern   Not on file  Social History Narrative   Arm '66-'68, [place], agent orange exposure.    Social Drivers of Corporate Investment Banker Strain: Low Risk  (01/26/2023)   Overall Financial Resource Strain (CARDIA)    Difficulty of Paying Living Expenses: Not hard at all  Food Insecurity: No Food Insecurity (04/06/2023)   Hunger Vital Sign    Worried About Running Out of Food in the Last Year: Never true    Ran Out of Food in the Last Year: Never true  Transportation Needs: No Transportation Needs (04/06/2023)   PRAPARE - Administrator, Civil Service (Medical): No    Lack of Transportation (Non-Medical): No  Physical Activity: Not on file  Stress: Not on file  Social Connections: Socially Integrated (04/07/2023)   Social Connection and Isolation Panel [NHANES]    Frequency of Communication with Friends and Family: More than three times a week    Frequency of Social Gatherings with Friends and Family: More than three times a week    Attends Religious  Services: 1 to 4 times per year    Active Member of [employer] or Organizations: Yes    Attends [employer]  or Organization Meetings: Never    Marital Status: Married    Review of Systems: A 12 point ROS discussed and pertinent positives are indicated in the HPI above.  All other systems are negative.  Review of Systems  Vital Signs: BP (!) 157/72   Pulse 99   Temp 98.1 F (36.7 C) (Oral)   Resp 16   Ht 5' 6 (1.676 m)   Wt 166 lb 0.1 oz (75.3 kg)   SpO2 93%   BMI 26.79 kg/m   Physical Exam Constitutional:      Appearance: He is not ill-appearing.     Comments: Awake, not oriented. Thoughts not making sense. A bit agitated, trying to get out of bed.  HENT:     Mouth/Throat:     Mouth: Mucous membranes are moist.     Pharynx: Oropharynx is clear.  Cardiovascular:     Rate and Rhythm: Normal rate and regular rhythm.     Heart sounds: Normal heart sounds.  Pulmonary:     Effort: Pulmonary effort is normal. No respiratory distress.     Breath sounds: Normal breath sounds.  Neurological:     Mental Status: He is disoriented.     Imaging: Overnight EEG with video Result Date: 04/07/2023 Shelton Arlin KIDD, MD     04/07/2023  9:09 AM Patient Name: Curtis Campos MRN: [id number] Epilepsy Attending: Arlin KIDD Shelton Referring Physician/Provider: Voncile Isles, MD Duration: 04/06/2023 2041 to 04/07/2023 0900  Patient history: 83 year old male with stage II lung adenocarcinoma plans for radiation who presented with altered mental status and proceeded to have focal seizures which then generalized. EEG to evaluate for seizure  Level of alertness: Awake, asleep  AEDs during EEG study: LEV  Technical aspects: This EEG study was done with scalp electrodes positioned according to the 10-20 International system of electrode placement. Electrical activity was reviewed with band pass filter of 1-70Hz , sensitivity of 7 uV/mm, display speed of 41mm/sec with a 60Hz  notched filter applied as appropriate. EEG data  were recorded continuously and digitally stored.  Video monitoring was available and reviewed as appropriate.  Description: No clear posterior dominant rhythm was seen. Sleep was characterized by sleep spindles (12 to 14 Hz), maximal frontocentral region. EEG showed 3-5hz  theta-delta slowing with overriding 13 to 15 Hz beta activity. Sharp waves were noted in the hemisphere, maximal left frontal temporal region. Hyperventilation and photic stimulation were not performed.    ABNORMALITY - Sharp waves, left hemisphere, maximal left frontotemporal region - Continuous slow, generalized and lateralized left hemisphere  IMPRESSION: This study showed evidence of epileptogenicity and cortical dysfunction arising from left hemisphere, maximal in frontotemporal region. Additionally there is moderate diffuse encephalopathy.  No definite seizures were seen during this time.  Arlin KIDD Shelton   EEG adult Result Date: 04/06/2023 Shelton Arlin KIDD, MD     04/06/2023  2:24 PM Patient Name: Curtis Campos MRN: [id number] Epilepsy Attending: Arlin KIDD Shelton Referring Physician/Provider: Keturah Carrier, MD Date: 04/06/2023 Duration: 22.10 mins Patient history: 83 year old male with stage II lung adenocarcinoma plans for radiation who presented with altered mental status and proceeded to have focal seizures which then generalized. EEG to evaluate for seizure Level of alertness: Awake, asleep AEDs during EEG study: LEV, Ativan  Technical aspects: This EEG study was done with scalp electrodes positioned according to the 10-20 International system of electrode placement. Electrical activity was reviewed with band pass filter of 1-70Hz , sensitivity of 7 uV/mm, display speed of 32mm/sec with a 60Hz  notched  filter applied as appropriate. EEG data were recorded continuously and digitally stored.  Video monitoring was available and reviewed as appropriate. Description: No clear posterior dominant rhythm was seen. Sleep was characterized by  sleep spindles (12 to 14 Hz), maximal frontocentral region. EEG showed 3-5hz  theta-delta slowing with overriding 13 to 15 Hz beta activity. Sharp waves were noted in the hemisphere, maximal left frontal temporal region, at times quasi periodic at 1 Hz.  Hyperventilation and photic stimulation were not performed.   ABNORMALITY - Sharp waves, left hemisphere, maximal left frontotemporal region - Continuous slow, generalized and lateralized left hemisphere IMPRESSION: This study showed evidence of epileptogenicity and cortical dysfunction arising from left hemisphere, maximal in frontotemporal region.  Additionally there is moderate diffuse encephalopathy.  No definite seizures were seen during this time. Recommend long-term EEG for further evaluation. Priyanka O Yadav   MR BRAIN W WO CONTRAST Result Date: 04/06/2023 CLINICAL DATA:  New onset seizure. History of rectal cancer. History of lung cancer. EXAM: MRI HEAD WITHOUT AND WITH CONTRAST TECHNIQUE: Multiplanar, multiecho pulse sequences of the brain and surrounding structures were obtained without and with intravenous contrast. CONTRAST:  7mL GADAVIST  GADOBUTROL  1 MMOL/ML IV SOLN COMPARISON:  Head CT 04/05/2023.  MRI 03/16/2023. FINDINGS: Brain: Diffusion imaging does not show any acute or subacute infarction or other cause of restricted diffusion. Chronic small-vessel ischemic changes affect the pons. No focal cerebellar insult. Cerebral hemispheres show advanced chronic small-vessel ischemic changes of the deep and subcortical white matter. Chronic susceptibility artifact in the right occipital lobe previously felt secondary to a small cavernoma and developmental venous anomaly. No change in this appearance. There is no evidence of neoplastic mass lesion, hemorrhage, hydrocephalus or extra-axial collection. No abnormal brain or leptomeningeal enhancement occurs. Vascular: Major vessels at the base of the brain show flow. Skull and upper cervical spine: Negative  Sinuses/Orbits: Clear/normal Other: None IMPRESSION: 1. No acute or reversible finding. No evidence of metastatic disease. 2. Advanced chronic small-vessel ischemic changes of the pons and cerebral hemispheric white matter. 3. Chronic susceptibility artifact in the right occipital lobe previously felt secondary to a small cavernoma and developmental venous anomaly. No change in this appearance. Electronically Signed   By: Oneil Officer M.D.   On: 04/06/2023 09:40   DG CHEST PORT 1 VIEW Result Date: 04/06/2023 CLINICAL DATA:  Lung cancer patient presents with hypoxia. [id number]. EXAM: PORTABLE CHEST 1 VIEW COMPARISON:  Portable chest yesterday at 8:02 p.m. FINDINGS: Interval increased patchy consolidation in the left lower lung field concerning for pneumonia or aspiration. Emphysematous changes and biapical scarring with nearly 5 cm left upper lobe mass are again noted. Remaining lungs are clear. The cardiac size is normal. A TAVR is again noted with aortic tortuosity and calcifications, stable mediastinum. There is suspected a small left pleural effusion as well. No new osseous findings. IMPRESSION: 1. Interval increased patchy consolidation in the left lower lung field concerning for pneumonia or aspiration. 2. Suspected small left pleural effusion. 3. Emphysematous changes and biapical scarring with nearly 5 cm known left upper lobe mass. Electronically Signed   By: Francis Quam M.D.   On: 04/06/2023 05:10   DG Chest Port 1 View Result Date: 04/05/2023 CLINICAL DATA:  Chest pain EXAM: PORTABLE CHEST 1 VIEW COMPARISON:  03/06/2023 FINDINGS: Again seen is the left upper lobe mass compatible with known primary lung cancer. Patchy opacities in the right upper lobe are unchanged. This likely reflects scarring as seen on prior chest CT. No acute confluent opacities  or effusions. Heart is normal size. No acute bony abnormality. IMPRESSION: Left upper lobe mass again noted as seen on prior imaging compatible with known  lung cancer. No acute cardiopulmonary disease. Electronically Signed   By: Franky Crease M.D.   On: 04/05/2023 20:21   CT Head Wo Contrast Result Date: 04/05/2023 CLINICAL DATA:  Headache EXAM: CT HEAD WITHOUT CONTRAST TECHNIQUE: Contiguous axial images were obtained from the base of the skull through the vertex without intravenous contrast. RADIATION DOSE REDUCTION: This exam was performed according to the departmental dose-optimization program which includes automated exposure control, adjustment of the mA and/or kV according to patient size and/or use of iterative reconstruction technique. COMPARISON:  Head CT 01/18/2023.  MRI brain 01/14/2023. FINDINGS: Brain: No evidence of acute infarction, hemorrhage, hydrocephalus, extra-axial collection or mass lesion/mass effect. Again seen is mild diffuse atrophy. There is extensive periventricular and deep white matter hypodensity which is similar to the prior examination. Vascular: Atherosclerotic calcifications are present within the cavernous internal carotid arteries. Skull: Normal. Negative for fracture or focal lesion. Sinuses/Orbits: No acute finding. Other: None. IMPRESSION: 1. No acute intracranial process. 2. Stable atrophy and chronic small vessel ischemic changes. Electronically Signed   By: Greig Pique M.D.   On: 04/05/2023 20:14   MR Brain W Wo Contrast Result Date: 04/03/2023 CLINICAL DATA:  Provided history: Adenocarcinoma of left lung. Non-small cell lung cancer, staging. Metastatic disease evaluation. EXAM: MRI HEAD WITHOUT AND WITH CONTRAST TECHNIQUE: Multiplanar, multiecho pulse sequences of the brain and surrounding structures were obtained without and with intravenous contrast. CONTRAST:  7mL GADAVIST  GADOBUTROL  1 MMOL/ML IV SOLN COMPARISON:  Head CT 01/18/2023. FINDINGS: Brain: Mild generalized parenchymal atrophy. Multifocal T2 FLAIR hyperintense signal abnormality within the cerebral white matter, nonspecific but compatible with advanced  chronic small vessel ischemic disease. To a lesser degree, chronic small vessel ischemic changes are also present within the pons. 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 Punctate chronic microhemorrhage within the right parietal lobe. No evidence of an intracranial mass or intracranial metastatic disease. No cortical encephalomalacia is identified. There is no acute infarct. No extra-axial fluid collection. No midline shift. Vascular: Maintained flow voids within the proximal large arterial vessels. Developmental venous anomaly as described above. Skull and upper cervical spine: No focal worrisome marrow lesion. Incompletely assessed cervical spondylosis. Ligamentous hypertrophy posterior to the dens. Sinuses/Orbits: No mass or acute finding within the imaged orbits. Prior bilateral ocular lens replacement. Minimal mucosal thickening within the bilateral frontal and ethmoid sinuses. IMPRESSION: 1. No evidence of intracranial metastatic disease. 2. Advanced chronic small vessel ischemic changes within the cerebral white matter, and to a lesser degree within the pons. 3. Subcentimeter cavernoma in the right occipital lobe (with an adjacent developmental venous anomaly). 4. Mild generalized parenchymal atrophy. 5. Minor frontal and ethmoid sinus mucosal thickening bilaterally. Electronically Signed   By: Rockey Childs D.O.   On: 04/03/2023 11:03    Labs:  CBC: Recent Labs    03/06/23 1615 04/05/23 1902 04/06/23 0448 04/07/23 0630  WBC 13.1* 18.6* 18.1* 23.9*  HGB 9.0* 8.8* 8.8* 8.8*  HCT 28.6* 29.0* 27.7* 27.3*  PLT 367 574* 574* 586*    COAGS: Recent Labs    04/05/23 1902  INR 1.2    BMP: Recent Labs    03/06/23 1615 04/05/23 1902 04/06/23 0448 04/07/23 0536  NA 131* 131* 133* 133*  K 3.9 3.4* 3.3* 3.9  CL 94* 93* 95* 90*  CO2 26 27  26 24  GLUCOSE 113* 166* 114* 94  BUN 26* 14 15 18   CALCIUM 8.4* 8.7* 8.7* 8.6*   CREATININE 1.03 0.77 0.75 0.74  GFRNONAA >60 >60 >60 >60    LIVER FUNCTION TESTS: Recent Labs    01/26/23 1436 03/06/23 1615 04/05/23 1902  BILITOT 0.9 0.6 0.6  AST 14* 14* 13*  ALT 12 16 12   ALKPHOS 104 107 127*  PROT 6.6 6.3* 7.5  ALBUMIN 2.8* 2.8* 2.6*    Assessment and Plan: Encephalopathy and seizure activity, unknown etiology. Concern for meningitis. IV abx to be started. Plan for LP with sedation in IR tomorrow. NPO p MN Will coordinate with pharmacy to hold heparin  gtt once procedure time known. LP procedure risks and complications discussed with pt family at bedside. Consent obtained from son.   Electronically Signed: Franky Rusk, PA-C 04/07/2023, 2:52 PM   I spent a total of 20 minutes in face to face in clinical consultation, greater than 50% of which was counseling/coordinating care for LP

## 2023-04-07 NOTE — Progress Notes (Signed)
 PHARMACY - ANTICOAGULATION CONSULT NOTE  Pharmacy Consult for IV heparin  Indication: atrial fibrillation  Allergies  Allergen Reactions   Codeine Nausea Only   Statins Other (See Comments)    REACTION: myalgias    Patient Measurements: Height: 5' 6 (167.6 cm) Weight: 75.3 kg (166 lb 0.1 oz) IBW/kg (Calculated) : 63.8 Heparin  Dosing Weight: 75.3 kg  Vital Signs: Temp: 97.6 F (36.4 C) (01/08 2326) Temp Source: Axillary (01/08 2326) BP: 123/74 (01/08 2326) Pulse Rate: 68 (01/08 2326)  Labs: Recent Labs    04/05/23 1902 04/05/23 2049 04/06/23 0448 04/06/23 1158 04/07/23 0536 04/07/23 0603 04/07/23 0630 04/07/23 2206  HGB 8.8*  --  8.8*  --   --   --  8.8*  --   HCT 29.0*  --  27.7*  --   --   --  27.3*  --   PLT 574*  --  574*  --   --   --  586*  --   LABPROT 15.4*  --   --   --   --   --   --   --   INR 1.2  --   --   --   --   --   --   --   HEPARINUNFRC  --   --   --  <0.10*  --  <0.10*  --  0.13*  CREATININE 0.77  --  0.75  --  0.74  --   --   --   CKTOTAL  --  46*  --   --   --   --   --   --   TROPONINIHS  --   --  16  --   --   --   --   --     Estimated Creatinine Clearance: 64.2 mL/min (by C-G formula based on SCr of 0.74 mg/dL).   Medical History: Past Medical History:  Diagnosis Date   Agent orange exposure    Anemia    Anxiety    Aortic atherosclerosis (HCC)    Aortic stenosis 07/04/2013   a.)TTE 07/04/13: mild (MPG 12; AVA 1.6); b.) TTE 01/01/16: mod (MPG 14; AVA 1.17); c.) TTE 08/30/17: mod (MPG 15; AVA 1.25); d.) TTE 07/13/19: mod (MPG 19; AVA 1.16); e.) TTE 07/23/20: mod (MPG 22; AVA 1.25); f.) TTE 03/12/2021: mod-sev (MPH 34; AVA 0.75); g.) TTE 05/21/2021: mod (MPG 22; AVA 0.8); h.) RHC 08/27/2020: sev (MPG 26.18; AVA 0.69); i.) s/p TAVR 11/25/21: 26 mm Edwards S3 Ultra Resilia   Arthritis 03/12/2011   Cardiomegaly    Cerebral microvascular disease    Cholelithiasis    Chronic hyponatremia    Chronic obstructive lung disease (HCC)     Colostomy in place [name])    a.) s/p resection secondary to rectal cancer   Coronary artery disease 03/2013   a.) LHC 04/10/2013: 10% mLAD, 50% D1, 30% D2, 99% mLCx, 50% OM1, 40% pRCA, 30% mRCA, 60% RPDA, 40% RPLS - med mgmt; b.) MV 08/31/2019: basal inflat/mid inflat defect c/w ischemia in LCx distribution. EF 37%; c.) R/LHC 08/27/2020: 50% D1, 50% D2, 90% mLCx, 40% p-mRCA, 80% RPDA - med mgmt   Former smoker    HFrEF (heart failure with reduced ejection fraction) (HCC)    a.) TEE 04/13/13: EF 20-25, glob HK, G1DD, mild LAE, mod-sev MR; b.) TTE 08/30/17: EF 45-50, LVH, mild LAE, G1DD, mod AS, mod MR; c.) TTE 07/13/19: EF 30-35, glob HK, G1DD, RVSP 49.4, mild LAE, mild-mod MR, mod AS;  d.) TTE 03/12/21: EF 55, G2DD, mild MR, mod-sev AS; e.) TTE 12/24/21: EF 50, mild LVH, G1DD, RVSP 40.5, mild-mod MR, triv AR; f.) TTE 11/18/22: EF 45-50, glob HK, G1DD, mild MR   History of bilateral cataract extraction    Hyperlipidemia    Hypertension    Hypokalemia    Ischemic cardiomyopathy    a.) TTE 04/08/2014: EF 25-30%; b.) TEE 04/13/2013: EF 20-25%; c.) TTE 07/04/2013: EF 30-35%; d.) TTE 01/01/2016: EF 40-45%; e.) TTE 08/30/2017: EF 45-50%; f.) TTE 07/03/2019: EF 30-35%; g.) MV 08/31/2019: EF 37%; h.) TTE 07/23/20: EF 55-60%; i.) TTE 03/12/2021: EF 55%; j.) TTE 05/21/2021: EF 40-45%; k.) TTE 11/26/2021: EF 45-50%; l.) TTE 12/24/2021: EF 50%; m.) TTE 11/18/2022: EF 45-50%   Long term (current) use of aspirin     Mass of upper lobe of left lung 03/10/2021   a.) CTA chest 03/10/2021: 6.1 mm; b.) CTA chest 10/15/2021: 8 mm; c.) PET CT 11/14/2021: 8 mm (SUV max 2.8); d.) CXR 10/05/2022: interval increase to 2.7 cm; e.) CXR 01/18/2023: interval increase to 3.7 cm; f.) CT chest 01/22/2023: 3.8 x 3.2 cm; g.) PET CT 02/01/2023: 3.8 cm and markedly hypermetabolic with SUV max of 43   Multifocal atrial tachycardia (HCC)    Myocardial infarction (HCC)    a.) suspected by cardiology during 03/2013 admission for CP/CAP/new onset  CHF --> TTE  showed severe LV dysfunction with segmental wall motion abnormalities and severe MR (posteriorly-directed  jet)   Pneumonia    Pre-diabetes    Pulmonary hypertension (HCC)    a.) TTE 04/08/2013: RVSP 62.2; b.) TTE 07/13/2019: RVSP 49.4; c.) TTE 07/23/2020: RVSP 33.2; d.) TTE 11/26/2021: RVSP 41.2; e.) TTE 12/24/2021: RVSP 40.5; f.) TTE 11/18/2019: RVSP 29.8   Rectal cancer (HCC)    Requires continuous at home supplemental oxygen     a.) 4L/[place]   S/P TAVR (transcatheter aortic valve replacement) 11/25/2021   a.) 26mm Edwards Sapien 3 Ultra Resilia via the TF approach   Statin intolerance    Umbilical hernia (s/p repair)    Assessment: Curtis Campos is a 83 y.o. year old male admitted on 04/05/2023 with concern for new afib RVR. No anticoagulation prior to admission. Pharmacy consulted to dose heparin .  1/8 PM update:  Heparin  level sub-therapeutic    Goal of Therapy:  Heparin  level 0.3-0.7 units/ml Monitor platelets by anticoagulation protocol: Yes   Plan:  Increase heparin  to 1600 units/hr Check 8 hour heparin  level Daily heparin  level, CBC, and monitoring for bleeding  Curtis Campos, PharmD, BCPS Clinical Pharmacist Phone: [phone]

## 2023-04-08 ENCOUNTER — Inpatient Hospital Stay: Admission: RE | Admit: 2023-04-08 | Payer: No Typology Code available for payment source | Source: Ambulatory Visit

## 2023-04-08 ENCOUNTER — Inpatient Hospital Stay (HOSPITAL_COMMUNITY): Payer: No Typology Code available for payment source

## 2023-04-08 ENCOUNTER — Ambulatory Visit: Payer: No Typology Code available for payment source

## 2023-04-08 DIAGNOSIS — Z9289 Personal history of other medical treatment: Secondary | ICD-10-CM

## 2023-04-08 DIAGNOSIS — Z7901 Long term (current) use of anticoagulants: Secondary | ICD-10-CM | POA: Diagnosis not present

## 2023-04-08 DIAGNOSIS — I5022 Chronic systolic (congestive) heart failure: Secondary | ICD-10-CM | POA: Diagnosis not present

## 2023-04-08 DIAGNOSIS — I480 Paroxysmal atrial fibrillation: Secondary | ICD-10-CM | POA: Diagnosis not present

## 2023-04-08 DIAGNOSIS — J9621 Acute and chronic respiratory failure with hypoxia: Secondary | ICD-10-CM | POA: Diagnosis not present

## 2023-04-08 DIAGNOSIS — G9341 Metabolic encephalopathy: Secondary | ICD-10-CM | POA: Diagnosis not present

## 2023-04-08 DIAGNOSIS — R569 Unspecified convulsions: Secondary | ICD-10-CM | POA: Diagnosis not present

## 2023-04-08 DIAGNOSIS — I4819 Other persistent atrial fibrillation: Secondary | ICD-10-CM | POA: Diagnosis not present

## 2023-04-08 HISTORY — PX: IR LUMBAR PUNCTURE: IMG944

## 2023-04-08 LAB — MENINGITIS/ENCEPHALITIS PANEL (CSF)

## 2023-04-08 LAB — BLOOD GAS, ARTERIAL
Acid-Base Excess: 1.2 mmol/L (ref 0.0–2.0)
Bicarbonate: 29 mmol/L — ABNORMAL HIGH (ref 20.0–28.0)
O2 Saturation: 99.8 %
Patient temperature: 36.3
pCO2 arterial: 57 mm[Hg] — ABNORMAL HIGH (ref 32–48)
pH, Arterial: 7.31 — ABNORMAL LOW (ref 7.35–7.45)
pO2, Arterial: 111 mm[Hg] — ABNORMAL HIGH (ref 83–108)

## 2023-04-08 LAB — ECHOCARDIOGRAM LIMITED
AR max vel: 1.72 cm2
AV Area VTI: 2.15 cm2
AV Area mean vel: 1.82 cm2
AV Mean grad: 5 mm[Hg]
AV Peak grad: 11.2 mm[Hg]
Ao pk vel: 1.67 m/s
Calc EF: 45.1 %
Height: 66 in
S' Lateral: 4.1 cm
Single Plane A2C EF: 43.9 %
Single Plane A4C EF: 50 %
Weight: 2384.5 [oz_av]

## 2023-04-08 LAB — CBC
HCT: 26.2 % — ABNORMAL LOW (ref 39.0–52.0)
Hemoglobin: 8.7 g/dL — ABNORMAL LOW (ref 13.0–17.0)
MCH: 27.5 pg (ref 26.0–34.0)
MCHC: 33.2 g/dL (ref 30.0–36.0)
MCV: 82.9 fL (ref 80.0–100.0)
Platelets: 548 10*3/uL — ABNORMAL HIGH (ref 150–400)
RBC: 3.16 MIL/uL — ABNORMAL LOW (ref 4.22–5.81)
RDW: 14.9 % (ref 11.5–15.5)
WBC: 18.4 10*3/uL — ABNORMAL HIGH (ref 4.0–10.5)
nRBC: 0 % (ref 0.0–0.2)

## 2023-04-08 LAB — CSF CELL COUNT WITH DIFFERENTIAL
RBC Count, CSF: 1385 /mm3 — ABNORMAL HIGH
RBC Count, CSF: 21 /mm3 — ABNORMAL HIGH
Tube #: 1
Tube #: 4
WBC, CSF: 1 /mm3 (ref 0–5)
WBC, CSF: 9 /mm3 — ABNORMAL HIGH (ref 0–5)

## 2023-04-08 LAB — BASIC METABOLIC PANEL
Anion gap: 10 (ref 5–15)
BUN: 12 mg/dL (ref 8–23)
CO2: 27 mmol/L (ref 22–32)
Calcium: 8.4 mg/dL — ABNORMAL LOW (ref 8.9–10.3)
Chloride: 91 mmol/L — ABNORMAL LOW (ref 98–111)
Creatinine, Ser: 0.61 mg/dL (ref 0.61–1.24)
GFR, Estimated: >60 mL/min (ref >60)
Glucose, Bld: 112 mg/dL — ABNORMAL HIGH (ref 70–99)
Potassium: 3.6 mmol/L (ref 3.5–5.1)
Sodium: 128 mmol/L — ABNORMAL LOW (ref 135–145)

## 2023-04-08 LAB — VALPROIC ACID LEVEL: Valproic Acid Lvl: 34 ug/mL — ABNORMAL LOW (ref 50.0–100.0)

## 2023-04-08 LAB — HEPARIN LEVEL (UNFRACTIONATED)
Heparin Unfractionated: <0.1 [IU]/mL — ABNORMAL LOW (ref 0.30–0.70)
Heparin Unfractionated: 0.15 [IU]/mL — ABNORMAL LOW (ref 0.30–0.70)

## 2023-04-08 LAB — PROTEIN AND GLUCOSE, CSF
Glucose, CSF: 61 mg/dL (ref 40–70)
Total  Protein, CSF: 44 mg/dL (ref 15–45)

## 2023-04-08 LAB — AMMONIA: Ammonia: 25 umol/L (ref 9–35)

## 2023-04-08 MED ORDER — SODIUM CHLORIDE 0.9 % IV SOLN
200.0000 mg | Freq: Once | INTRAVENOUS | Status: AC
  Administered 2023-04-08: 200 mg via INTRAVENOUS
  Filled 2023-04-08: qty 20

## 2023-04-08 MED ORDER — MIDAZOLAM HCL 2 MG/2ML IJ SOLN
INTRAMUSCULAR | Status: AC
  Filled 2023-04-08: qty 4

## 2023-04-08 MED ORDER — LIDOCAINE HCL (PF) 1 % IJ SOLN
INTRAMUSCULAR | Status: AC
  Filled 2023-04-08: qty 30

## 2023-04-08 MED ORDER — LEVETIRACETAM IN NACL 1500 MG/100ML IV SOLN
1500.0000 mg | Freq: Once | INTRAVENOUS | Status: AC
  Administered 2023-04-08: 1500 mg via INTRAVENOUS
  Filled 2023-04-08: qty 100

## 2023-04-08 MED ORDER — LIDOCAINE HCL (PF) 1 % IJ SOLN
30.0000 mL | Freq: Once | INTRAMUSCULAR | Status: AC
  Administered 2023-04-08: 15 mL via INTRADERMAL
  Filled 2023-04-08: qty 30

## 2023-04-08 MED ORDER — FENTANYL CITRATE (PF) 100 MCG/2ML IJ SOLN
INTRAMUSCULAR | Status: AC
  Filled 2023-04-08: qty 4

## 2023-04-08 MED ORDER — SODIUM CHLORIDE 0.9 % IV SOLN
3.0000 g | Freq: Four times a day (QID) | INTRAVENOUS | Status: DC
  Administered 2023-04-08 – 2023-04-11 (×11): 3 g via INTRAVENOUS
  Filled 2023-04-08 (×12): qty 8

## 2023-04-08 MED ORDER — LORAZEPAM 2 MG/ML IJ SOLN
INTRAMUSCULAR | Status: AC | PRN
  Administered 2023-04-08: .5 mg via INTRAVENOUS

## 2023-04-08 MED ORDER — VALPROATE SODIUM 100 MG/ML IV SOLN
500.0000 mg | Freq: Three times a day (TID) | INTRAVENOUS | Status: DC
  Administered 2023-04-08 – 2023-04-11 (×9): 500 mg via INTRAVENOUS
  Filled 2023-04-08 (×3): qty 5
  Filled 2023-04-08: qty 500
  Filled 2023-04-08: qty 5
  Filled 2023-04-08 (×2): qty 500
  Filled 2023-04-08 (×5): qty 5

## 2023-04-08 MED ORDER — FOLIC ACID 1 MG PO TABS
1.0000 mg | ORAL_TABLET | Freq: Every day | ORAL | Status: DC
  Administered 2023-04-08 – 2023-04-10 (×3): 1 mg via ORAL
  Filled 2023-04-08 (×3): qty 1

## 2023-04-08 MED ORDER — SODIUM CHLORIDE 0.9 % IV SOLN
INTRAVENOUS | Status: DC
Start: 2023-04-08 — End: 2023-04-08

## 2023-04-08 MED ORDER — METOPROLOL SUCCINATE ER 50 MG PO TB24
50.0000 mg | ORAL_TABLET | Freq: Every day | ORAL | Status: DC
Start: 2023-04-08 — End: 2023-04-11
  Administered 2023-04-08 – 2023-04-10 (×3): 50 mg via ORAL
  Filled 2023-04-08 (×3): qty 1

## 2023-04-08 MED ORDER — FENTANYL CITRATE (PF) 100 MCG/2ML IJ SOLN
INTRAMUSCULAR | Status: AC | PRN
  Administered 2023-04-08: 25 ug via INTRAVENOUS

## 2023-04-08 NOTE — Progress Notes (Signed)
 Meningitis/encephalitis panel neg. Ok to stop abx per Dr. Derry Lory.  Ulyses Southward, PharmD, BCIDP, AAHIVP, CPP Infectious Disease Pharmacist 04/08/2023 2:20 PM

## 2023-04-08 NOTE — Progress Notes (Addendum)
 NEUROLOGY CONSULT FOLLOW UP NOTE   Date of service: April 08, 2023 Patient Name: Curtis Campos MRN:  [id number] DOB:  1940/06/06  Brief HPI  83 year old male with stage II lung adenocarcinoma plans for radiation who presented with altered mental status and proceeded to have focal seizures which then generalized.   Etiology of new onset seizures is unclear.  He was started on Keppra  500 mg twice daily and continues EEG was obtained which demonstrated a left frontotemporal sharp waves.  Workup with MRI [place] with and without contrast with noted chronic right occipital susceptibility artifact, no clear abnormality noted in the left frontotemporal region specifically, no leptomeningeal enhancement or T2 flair hyperintensity or stroke.  He was started on empiric meningitis coverage, Keppra  was switched to Valproic  acid and LP under fluoro planned for today.   Interval Hx/subjective   Overnight started on Vimpat  100mg  BID. Valproic  acid levels at 34. EEG with triphasic morphology and fluctuating frequency of 1 to 2.5 Hz with overlying rhythmicity. Given ictal-interictal pattern, loaded with Keppra  x1 and increased Valproic  acid to 500mg  Q8H.  Vitals   Vitals:   04/08/23 1030 04/08/23 1035 04/08/23 1040 04/08/23 1045  BP: 118/70 (!) 126/92 (!) 141/83 (!) 121/99  Pulse: 64 64 67 64  Resp: 19 (!) 23 (!) 26 20  Temp:      TempSrc:      SpO2: 98% 99% 100% 100%  Weight:      Height:         Body mass index is 24.05 kg/m.  Physical Exam   General: calm today, in no acute distress.  HENT: EEG leads in place, no obvious skin tear, normal oropharynx and mucosa. Normal external appearance of ears and nose.  Neck: Supple, no pain or tenderness  CV: No JVD. No peripheral edema.  Pulmonary: Symmetric Chest rise. Normal respiratory effort.  Abdomen: Soft to touch, non-tender.  Ext: No cyanosis, edema, or deformity  Skin: No rash. Normal palpation of skin.   Musculoskeletal: Normal digits  and nails by inspection. No clubbing.   Neurologic Examination  Mental status/Cognition: somnolent, opens eyes to voice briefly. Requires constant stimulation to stay awake but answering 1 step questions, oriented to self, age. Cognitive eval limited by encephalopathy and somnolence. Speech/language: slight dysarthric, and hypophonic probably due to dry mouth. comprehension intact to simple commands, counts fingers and identifies chin, thumb,ears, nose, forehead. Cranial nerves:   CN II Pupils equal and reactive to light   CN III,IV,VI EOM intact, no gaze preference or deviation, no nystagmus   CN V normal sensation in V1, V2, and V3 segments bilaterally   CN VII no asymmetry, no nasolabial fold flattening   CN VIII Makes eye contact to speech   CN IX & X normal palatal elevation, no uvular deviation   CN XI Head midline.   CN XII midline tongue protrusion   Neck: No nuchal rigidity  Motor:  Muscle bulk: normal, tone normal. Moves all extremities spontaneously and antigravity except for his R leg today but that I suspect was probably due to poor effort due to somnolence.  Coordination/Complex Motor:  Deferred due to somnolence.   Medications  Current Facility-Administered Medications:    0.9 %  sodium chloride  infusion, , Intravenous, Continuous, Billy Rocky SAUNDERS, RPH, Stopped at 04/07/23 1812   acyclovir  (ZOVIRAX ) 750 mg in dextrose  5 % 250 mL IVPB, 750 mg, Intravenous, Q8H, Billy Rocky SAUNDERS, RPH, Last Rate: 265 mL/hr at 04/08/23 1131, 750 mg at 04/08/23 1131  ampicillin  (OMNIPEN) 2 g in sodium chloride  0.9 % 100 mL IVPB, 2 g, Intravenous, Q4H, Pham, Minh Q, RPH-CPP, Last Rate: 300 mL/hr at 04/08/23 0814, 2 g at 04/08/23 9185   aspirin  chewable tablet 81 mg, 81 mg, Oral, QHS, Keturah Carrier, MD, 81 mg at 04/07/23 2111   cefTRIAXone  (ROCEPHIN ) 2 g in sodium chloride  0.9 % 100 mL IVPB, 2 g, Intravenous, Q12H, Pham, Minh Q, RPH-CPP, Last Rate: 200 mL/hr at 04/08/23 1144, 2 g at  04/08/23 1144   Chlorhexidine  Gluconate Cloth 2 % PADS 6 each, 6 each, Topical, Daily, Judeth Trenda BIRCH, MD, 6 each at 04/07/23 1056   feeding supplement (ENSURE ENLIVE / ENSURE PLUS) liquid 237 mL, 237 mL, Oral, BID BM, Hongalgi, Anand D, MD   guaiFENesin  (MUCINEX ) 12 hr tablet 600 mg, 600 mg, Oral, BID, Hongalgi, Anand D, MD, 600 mg at 04/07/23 2110   heparin  ADULT infusion 100 units/mL (25000 units/250mL), 1,600 Units/hr, Intravenous, Continuous, Clair Lynwood CROME, RPH, Stopped at 04/08/23 9292   ipratropium-albuterol  (DUONEB) 0.5-2.5 (3) MG/3ML nebulizer solution 3 mL, 3 mL, Nebulization, Q4H PRN, Judeth Trenda BIRCH, MD   lacosamide  (VIMPAT ) tablet 100 mg, 100 mg, Oral, BID, Bhagat, Srishti L, MD, 100 mg at 04/07/23 2229   LORazepam  (ATIVAN ) injection 2 mg, 2 mg, Intravenous, Q5 min PRN, Segars, Jonathan, MD   metoprolol  succinate (TOPROL -XL) 24 hr tablet 50 mg, 50 mg, Oral, Daily, Haley, Sheng L, PA-C   mometasone -formoterol  (DULERA ) 200-5 MCG/ACT inhaler 2 puff, 2 puff, Inhalation, BID, Segars, Carrier, MD, 2 puff at 04/07/23 2022   ondansetron  (ZOFRAN ) injection 4 mg, 4 mg, Intravenous, Q6H PRN, Segars, Jonathan, MD   sacubitril -valsartan  (ENTRESTO ) 24-26 mg per tablet, 1 tablet, Oral, BID, Lenon Elsie HERO, [facility], 1 tablet at 04/07/23 2111   sodium chloride  flush (NS) 0.9 % injection 3 mL, 3 mL, Intravenous, Q12H, Segars, Carrier, MD, 3 mL at 04/07/23 2111   sodium chloride  tablet 1 g, 1 g, Oral, BID WC, Hongalgi, Anand D, MD, 1 g at 04/08/23 0825   thiamine  (VITAMIN B1) injection 100 mg, 100 mg, Intravenous, Daily **OR** thiamine  (VITAMIN B1) tablet 100 mg, 100 mg, Oral, Daily, Lorren Rossetti, MD, 100 mg at 04/07/23 1422   torsemide  (DEMADEX ) tablet 20 mg, 20 mg, Oral, Daily, Ricky Fines, MD, 20 mg at 04/07/23 1054   traZODone  (DESYREL ) tablet 50 mg, 50 mg, Oral, QHS PRN, Hongalgi, Anand D, MD, 50 mg at 04/07/23 2110   valproate (DEPACON ) 500 mg in dextrose  5 % 50 mL IVPB, 500 mg,  Intravenous, Q8H, Esequiel Kleinfelter, MD   vancomycin  (VANCOCIN ) IVPB 1000 mg/200 mL premix, 1,000 mg, Intravenous, Q12H, Pham, Minh Q, RPH-CPP, Last Rate: 200 mL/hr at 04/08/23 0412, 1,000 mg at 04/08/23 0412 Labs and Diagnostic Imaging   CBC:  Recent Labs  Lab 04/07/23 0630 04/08/23 0921  WBC 23.9* 18.4*  HGB 8.8* 8.7*  HCT 27.3* 26.2*  MCV 84.0 82.9  PLT 586* 548*    Basic Metabolic Panel:  Lab Results  Component Value Date   NA 128 (L) 04/08/2023   K 3.6 04/08/2023   CO2 27 04/08/2023   GLUCOSE 112 (H) 04/08/2023   BUN 12 04/08/2023   CREATININE 0.61 04/08/2023   CALCIUM 8.4 (L) 04/08/2023   GFRNONAA >60 04/08/2023   GFRAA 79 04/04/2020   Lipid Panel:  Lab Results  Component Value Date   LDLCALC 106 (H) 02/12/2020   HgbA1c:  Lab Results  Component Value Date   HGBA1C 6.3 02/12/2020  Urine Drug Screen: No results found for: LABOPIA, COCAINSCRNUR, LABBENZ, AMPHETMU, THCU, LABBARB  Alcohol  Level No results found for: [facility] INR  Lab Results  Component Value Date   INR 1.2 04/05/2023   APTT No results found for: APTT AED levels: No results found for: PHENYTOIN, ZONISAMIDE, LAMOTRIGINE, LEVETIRACETA  Valproic  Acid levels: 34  CSF studies are all pending.  No new imaging to review today.  cEEG:  This study showed generalized periodic discharges with triphasic morphology and fluctuating frequency of 1 to 2.5 Hz with overlying rhythmicity.  This EEG pattern was noted average 6 times every hour lasting for about 1 minute each time. This pattern is on the ictal-interictal continuum with increased risk of seizures.   Assessment   KRU ALLMAN is a 83 y.o. male with stage II lung adenocarcinoma plans for radiation who presented with altered mental status and proceeded to have focal seizures which then generalized. Etiology of new onset seizures is unclear.  He was started on Keppra  500 mg twice daily and continues EEG was obtained which  demonstrated left frontotemporal sharp waves.  Workup with MRI [place] with and without contrast with noted chronic right occipital susceptibility artifact, no clear abnormality noted in the left frontotemporal region specifically, no leptomeningeal enhancement or T2 flair hyperintensity or stroke.  Differential for his continued confusion and new onset seizures broad including potential meningitis, leptomeningeal spread of his cancer, paraneoplastic process, new onset seizure in the setting of possible dementia.  Recommendations  - LP under fluoro completed today, results are all pending. - LP with CSF cell count and differential x 2, protein and glucose, CSF Gram stain and cultures, meningitis/encephalitis panel, CSF cytology, and CSF and serum paraneoplastic panel have been ordered. I called the lab and made sure all the studies will be done. I spoke with Luke with lab and she reassured me that csf cytology is being run despite the chart saying that it needs to be collected. - Empiric meningits coverage with Vanc, ceftriaxone , ampicillin  and acyclovir  while waiting on CSF studies to come back. - increased Valproic  acid to 500mg  Q8H, added Vimpat  100mg  BID overnight, loaded with 1 time dose of Keppra  1500mg . - Valproic  acid levels ordered for tomorrow AM. - cEEG overnight again. - Thiamine  and folate levels. Will start him on thiamine  100mg  IV or PO daily. Added Folic acid  1mg  PO daily for low normal folate levels. ______________________________________________________________________  Plan discussed with Dr. Judeth in person. I called patient's son Mr. Curtistine Shams and spoke with him over the phone.  Update: 3:07 PM CSF studies with no pleocytosis on Tube 4, no elevated protein, meningitis/encephalitis panel is negative. Will stop empiric meningitis coverage. I have reached out to Dr. Judeth to see if he would like to continue any antibiotic for other potential non-meningitic infection and  he is okay with discontinuing all the antibiotics. Pharmacy has discontinued meningitis coverage at this time. I called patient's son to update him too.  Signed, Quinnton Bury, MD [facility]

## 2023-04-08 NOTE — Plan of Care (Signed)
 More drowsy as compared to earlier shift, still confused, though responsive and follow command. Relative is at bedside and cooperative.    Problem: Education: Goal: Knowledge of General Education information will improve Description: Including pain rating scale, medication(s)/side effects and non-pharmacologic comfort measures Outcome: Progressing   Problem: Clinical Measurements: Goal: Will remain free from infection Outcome: Progressing   Problem: Activity: Goal: Risk for activity intolerance will decrease Outcome: Progressing   Problem: Nutrition: Goal: Adequate nutrition will be maintained Outcome: Progressing   Problem: Skin Integrity: Goal: Risk for impaired skin integrity will decrease Outcome: Progressing   Problem: Safety: Goal: Verbalization of understanding the information provided will improve Outcome: Progressing

## 2023-04-08 NOTE — Progress Notes (Signed)
 PHARMACY - ANTICOAGULATION CONSULT NOTE  Pharmacy Consult for IV heparin  Indication: atrial fibrillation  Allergies  Allergen Reactions   Codeine Nausea Only   Statins Other (See Comments)    REACTION: myalgias    Patient Measurements: Height: 5' 6 (167.6 cm) Weight: 67.6 kg (149 lb 0.5 oz) IBW/kg (Calculated) : 63.8 Heparin  Dosing Weight: 75.3 kg  Vital Signs: Temp: 97.4 F (36.3 C) (01/09 2052) Temp Source: Axillary (01/09 2052) BP: 140/87 (01/09 2052) Pulse Rate: 68 (01/09 2007)  Labs: Recent Labs    04/06/23 0448 04/06/23 1158 04/07/23 0536 04/07/23 0603 04/07/23 0630 04/07/23 2206 04/08/23 0715 04/08/23 0921 04/08/23 2029  HGB 8.8*  --   --   --  8.8*  --   --  8.7*  --   HCT 27.7*  --   --   --  27.3*  --   --  26.2*  --   PLT 574*  --   --   --  586*  --   --  548*  --   HEPARINUNFRC  --    < >  --    < >  --  0.13*  --  <0.10* 0.15*  CREATININE 0.75  --  0.74  --   --   --  0.61  --   --   TROPONINIHS 16  --   --   --   --   --   --   --   --    < > = values in this interval not displayed.    Estimated Creatinine Clearance: 64.2 mL/min (by C-G formula based on SCr of 0.61 mg/dL).   Medical History: Past Medical History:  Diagnosis Date   Agent orange exposure    Anemia    Anxiety    Aortic atherosclerosis (HCC)    Aortic stenosis 07/04/2013   a.)TTE 07/04/13: mild (MPG 12; AVA 1.6); b.) TTE 01/01/16: mod (MPG 14; AVA 1.17); c.) TTE 08/30/17: mod (MPG 15; AVA 1.25); d.) TTE 07/13/19: mod (MPG 19; AVA 1.16); e.) TTE 07/23/20: mod (MPG 22; AVA 1.25); f.) TTE 03/12/2021: mod-sev (MPH 34; AVA 0.75); g.) TTE 05/21/2021: mod (MPG 22; AVA 0.8); h.) RHC 08/27/2020: sev (MPG 26.18; AVA 0.69); i.) s/p TAVR 11/25/21: 26 mm Edwards S3 Ultra Resilia   Arthritis 03/12/2011   Cardiomegaly    Cerebral microvascular disease    Cholelithiasis    Chronic hyponatremia    Chronic obstructive lung disease (HCC)    Colostomy in place [facility])    a.) s/p resection  secondary to rectal cancer   Coronary artery disease 03/2013   a.) LHC 04/10/2013: 10% mLAD, 50% D1, 30% D2, 99% mLCx, 50% OM1, 40% pRCA, 30% mRCA, 60% RPDA, 40% RPLS - med mgmt; b.) MV 08/31/2019: basal inflat/mid inflat defect c/w ischemia in LCx distribution. EF 37%; c.) R/LHC 08/27/2020: 50% D1, 50% D2, 90% mLCx, 40% p-mRCA, 80% RPDA - med mgmt   Former smoker    HFrEF (heart failure with reduced ejection fraction) (HCC)    a.) TEE 04/13/13: EF 20-25, glob HK, G1DD, mild LAE, mod-sev MR; b.) TTE 08/30/17: EF 45-50, LVH, mild LAE, G1DD, mod AS, mod MR; c.) TTE 07/13/19: EF 30-35, glob HK, G1DD, RVSP 49.4, mild LAE, mild-mod MR, mod AS; d.) TTE 03/12/21: EF 55, G2DD, mild MR, mod-sev AS; e.) TTE 12/24/21: EF 50, mild LVH, G1DD, RVSP 40.5, mild-mod MR, triv AR; f.) TTE 11/18/22: EF 45-50, glob HK, G1DD, mild MR   History of bilateral  cataract extraction    Hyperlipidemia    Hypertension    Hypokalemia    Ischemic cardiomyopathy    a.) TTE 04/08/2014: EF 25-30%; b.) TEE 04/13/2013: EF 20-25%; c.) TTE 07/04/2013: EF 30-35%; d.) TTE 01/01/2016: EF 40-45%; e.) TTE 08/30/2017: EF 45-50%; f.) TTE 07/03/2019: EF 30-35%; g.) MV 08/31/2019: EF 37%; h.) TTE 07/23/20: EF 55-60%; i.) TTE 03/12/2021: EF 55%; j.) TTE 05/21/2021: EF 40-45%; k.) TTE 11/26/2021: EF 45-50%; l.) TTE 12/24/2021: EF 50%; m.) TTE 11/18/2022: EF 45-50%   Long term (current) use of aspirin     Mass of upper lobe of left lung 03/10/2021   a.) CTA chest 03/10/2021: 6.1 mm; b.) CTA chest 10/15/2021: 8 mm; c.) PET CT 11/14/2021: 8 mm (SUV max 2.8); d.) CXR 10/05/2022: interval increase to 2.7 cm; e.) CXR 01/18/2023: interval increase to 3.7 cm; f.) CT chest 01/22/2023: 3.8 x 3.2 cm; g.) PET CT 02/01/2023: 3.8 cm and markedly hypermetabolic with SUV max of 43   Multifocal atrial tachycardia (HCC)    Myocardial infarction (HCC)    a.) suspected by cardiology during 03/2013 admission for CP/CAP/new onset CHF --> TTE  showed severe LV dysfunction with  segmental wall motion abnormalities and severe MR (posteriorly-directed  jet)   Pneumonia    Pre-diabetes    Pulmonary hypertension (HCC)    a.) TTE 04/08/2013: RVSP 62.2; b.) TTE 07/13/2019: RVSP 49.4; c.) TTE 07/23/2020: RVSP 33.2; d.) TTE 11/26/2021: RVSP 41.2; e.) TTE 12/24/2021: RVSP 40.5; f.) TTE 11/18/2019: RVSP 29.8   Rectal cancer (HCC)    Requires continuous at home supplemental oxygen     a.) 4L/[place]   S/P TAVR (transcatheter aortic valve replacement) 11/25/2021   a.) 26mm Edwards Sapien 3 Ultra Resilia via the TF approach   Statin intolerance    Umbilical hernia (s/p repair)    Assessment: Curtis Campos is a 83 y.o. year old male admitted on 04/05/2023 with concern for new afib RVR. No anticoagulation prior to admission. Pharmacy consulted to dose heparin .  Pt is s/p LP. Ok to resume heparin  per Dr. De Macedo.   PM: heparin  level 0.15, subtherapeutic on heparin  1600 units/hr. No issues with the infusion or bleeding reported per RN.   Goal of Therapy:  Heparin  level 0.3-0.7 units/ml Monitor platelets by anticoagulation protocol: Yes   Plan:  Increase heparin  1800 units/hr Check 8 hour heparin  level Daily heparin  level, CBC, and monitoring for bleeding F/u plans for anticoagulation   Rocky Slade, PharmD, BCPS 04/08/2023 9:26 PM  Please check AMION for all [facility] phone numbers After 10:00 PM, call Main Pharmacy [phone]

## 2023-04-08 NOTE — Progress Notes (Addendum)
 PROGRESS NOTE   Curtis Campos  FMW:Campos    DOB: [birth year]    DOA: 04/05/2023  PCP: [name], MD   I have briefly reviewed patients previous medical records in [facility].  Chief Complaint  Patient presents with   Altered Mental Status    Brief Hospital Course:   83 y.o. male with hx of stage II lung adenocarcinoma currently planned for upcoming radiation therapy, remote history of rectal cancer status post colostomy, aortic stenosis status post bioprosthetic TAVR, coronary artery disease, chronic systolic heart failure, hypertension, hyperlipidemia, COPD, chronic hypoxic respiratory failure on 4 L O2 at baseline, HLD (with statin intolerance), paroxysmal atrial fibrillation and chronic hyponatremia, who was brought in from home initially to [facility] ED due to altered mental status followed by what appeared to be possible seizure-like episode.  Family noted that patient was confused, was not making sense, unable to answer questions and while transporting him to ED, noted that he stiffened up.  In the ED he had witnessed generalized tonic-clonic seizures aborted with Ativan  2 mg.  Evaluated by neurology for seizures and Cardiology for A-fib with RVR and transferred to [facility] for LTM EEG and further management.  Neurology adjusting AEDs, on LTM EEG, underwent fluoroscopic guided LP 1/9 to rule out carcinomatous leptomeningitis and infectious meningitis, results pending.   Assessment & Plan:  Principal Problem:   Seizure (HCC) Active Problems:   Witnessed seizure-like activity (HCC)   Atrial fibrillation with RVR (HCC)   Persistent atrial fibrillation (HCC)   Altered mental status   History of transcatheter aortic valve replacement (TAVR)   Chronic heart failure with mildly reduced ejection fraction (HFmrEF, 41-49%) (HCC)   Atherosclerosis of native coronary artery of native heart without angina pectoris   New onset epilepsy  Presented with altered mental status, focal seizures  followed by generalized seizures Neurology consultation and follow-up appreciated. MRI brain without acute findings and specifically no evidence of metastatic disease. LTM EEG: Study showed evidence of epileptogenicity and cortical dysfunction arising from left hemisphere, maximal in frontotemporal region.  Additionally moderate diffuse encephalopathy.  No definitive seizures were seen. Given underlying stage II lung adenocarcinoma, neurology obtained LP under fluoroscopy guidance by IR 1/9, to rule out leptomeningeal carcinomatous spread and infectious meningitis.  Holding IV heparin  for same. As needed IV Ativan  for seizures Seizure precautions and patient needs to be advised regarding no driving and other limitations, at time of discharge. Neurology actively adjusting AEDs.  Keppra  switched to valproic  acid but since valproic  acid levels at 34, got a one-time loading dose of Keppra  and valproate dose increased.  Vimpat  was added.  Aspiration pneumonia Chest x-ray reviewed, aspiration pneumonia Empiric antibiotics for meningitis were discontinued by neurology Started IV Unasyn .  Aspiration precautions.  Acute metabolic encephalopathy This may all be postictal state from recent seizures and therapy for same.  Ruling out meningitis. Acute respiratory failure with higher than home need of oxygen  could certainly contribute.  Now weaned down to 4 L/min HFNC. Pro calcitonin negative.  UA negative. B12: 668.  TSH normal Avoid sedatives and opioids.  Delirium precautions. Due to new onset seizures, mental status change, worsening leukocytosis and hallucinations, needing to rule out meningitis, patient was started on IV vancomycin , ceftriaxone , ampicillin  and acyclovir  for meningitis cover pending LP results. As per continued LTM EEG read, findings with increased risk of seizures.  Paroxysmal atrial fibrillation with RVR At [facility] ED, noted to be RVR with diffuse ST segment depressions. Cardiology  consultation and  follow-up appreciated. IV Cardizem  x 1, briefly on Cardizem  drip, now stopped, due to pulmonary issues.  Not on AC PTA, now on IV heparin  Reverted to NSR Increasing Toprol -XL to 50 mg daily. Post LP, plans to switch over to DOAC.  Acute on chronic respiratory failure with hypoxia On home oxygen  4 L/min via [place] at baseline Was on on 10 L/min HFNC.  Discussed with RN at bedside, currently weaned down to 4 L/min HFNC. Chest x-ray 1/7: Interval increased patchy consolidation in left lower lung concerning for pneumonia or aspiration (however procalcitonin negative), small left pleural effusion, emphysematous changes and biapical scarring and known left upper lobe mass Influenza, RSV and SARS coronavirus 2 by RT-PCR negative. Acute respiratory failure probably multifactorial due to mental status change,?  Aspiration, complicating underlying advanced COPD, lung scaring and lung mass. Will not start antibiotics.  Clinically euvolemic, did receive IV Lasix  1/7. Continue to attempt to wean down to prior home level as tolerated.  Chronic combined systolic and diastolic CHF Clinically appears euvolemic.  TTE 8/24 with LVEF 45-50% and grade 1 diastolic dysfunction.  Repeat echo 1/9 with similar EF. On GDMT with Toprol -XL 50 Mg daily, Entresto  and torsemide  20 Mg daily.  Resume Farxiga  at discharge. Cardiology following.  CAD No anginal symptoms. Currently on aspirin  81 Mg daily-Per cardiology to be discontinued once he started on DOAC. Resume Praluent  and Zetia  at discharge.   COPD No clinical bronchospasm. Continue Dulera  and as needed bronchodilator nebs.   Mixed hyperlipidemia Intolerant to statins continue outpatient Praluent    Hypokalemia Replaced.  Magnesium  normal.  Hyponatremia May be due to intravascular volume depletion in the context of poor oral intake from mental status changes, ongoing diuretics. Will initiate brief IV NS hydration and follow-up BMP in AM.   Will need to reassess in a.m. regarding continuing or holding torsemide . Some of the hyponatremia may be chronic given that he is on salt tablets at home, resumed.   Stage II adenocarcinoma of lung Continue outpatient follow-up with oncology service.   Leukocytosis WBC has worsened from 18.1-23.9 Suspect stress demargination. Despite pneumonia reported on chest x-ray and he could have certainly have had an aspiration event, negative procalcitonin argues against pneumonia. As noted above, antibiotics initiated for meningitis.  WBC down to 18K. Repeat chest x-ray.  Anemia of chronic disease Stable.  Severe aortic stenosis s/p TAVR 8/23 Outpatient follow-up with cardiology.  Body mass index is 24.05 kg/m.   DVT prophylaxis: SCDs Start: 04/06/23 0307     Code Status: Full Code:  Family Communication: Discussed in detail with patient's son via phone, updated care and answered all questions. Disposition:  Status is: Inpatient Remains inpatient appropriate because: Remains critically ill, on IV antibiotics, LTM EEG, titrating AEDs.     Consultants:   Neurology Cardiology IR  Procedures:   LTM EEG Fluoroscopic guided LP by IR on 1/9.  Antimicrobials:      Subjective:  Lethargic/drowsy but arousable, tracks appropriately, after several attempts to elicit response, oriented to self and place but states the year is 1942.  Poor historian.  No further history able to be obtained.  Objective:   Vitals:   04/08/23 1035 04/08/23 1040 04/08/23 1045 04/08/23 1139  BP: (!) 126/92 (!) 141/83 (!) 121/99 (!) 156/95  Pulse: 64 67 64 70  Resp: (!) 23 (!) 26 20 16   Temp:    98.5 F (36.9 C)  TempSrc:    Oral  SpO2: 99% 100% 100% 94%  Weight:  Height:        General exam: Elderly male, moderately built and nourished, ill looking, lying propped up in bed, mouth breathing.  Mouth dry. Respiratory system: Slightly harsh breath sounds bilaterally but no clear wheezing, rhonchi  or crackles.  Discussed with RN at bedside and were advised to titrate down oxygen  as tolerated. Cardiovascular system: S1 & S2 heard, RRR. No JVD, murmurs, rubs, gallops or clicks. No pedal edema.  Telemetry personally reviewed: Sinus rhythm. Gastrointestinal system: Abdomen is nondistended, soft and nontender. No organomegaly or masses felt. Normal bowel sounds heard. Central nervous system: Mental status as noted above. No focal neurological deficits. Extremities: Symmetric 5 x 5 power. Skin: No rashes, lesions or ulcers Psychiatry: Judgement and insight impaired. Mood & affect cannot be assessed.     Data Reviewed:   I have personally reviewed following labs and imaging studies   CBC: Recent Labs  Lab 04/06/23 0448 04/07/23 0630 04/08/23 0921  WBC 18.1* 23.9* 18.4*  HGB 8.8* 8.8* 8.7*  HCT 27.7* 27.3* 26.2*  MCV 86.0 84.0 82.9  PLT 574* 586* 548*    Basic Metabolic Panel: Recent Labs  Lab 04/05/23 1902 04/06/23 0448 04/07/23 0536 04/08/23 0715  NA 131* 133* 133* 128*  K 3.4* 3.3* 3.9 3.6  CL 93* 95* 90* 91*  CO2 27 26 24 27   GLUCOSE 166* 114* 94 112*  BUN 14 15 18 12   CREATININE 0.77 0.75 0.74 0.61  CALCIUM 8.7* 8.7* 8.6* 8.4*  MG 2.1 2.2 2.0  --   PHOS  --  3.3  --   --     Liver Function Tests: Recent Labs  Lab 04/05/23 1902  AST 13*  ALT 12  ALKPHOS 127*  BILITOT 0.6  PROT 7.5  ALBUMIN 2.6*    CBG: No results for input(s): GLUCAP in the last 168 hours.  Microbiology Studies:   Recent Results (from the past 240 hours)  Resp panel by RT-PCR (RSV, Flu A&B, Covid) Anterior Nasal Swab     Status: None   Collection Time: 04/05/23  7:51 PM   Specimen: Anterior Nasal Swab  Result Value Ref Range Status   SARS Coronavirus 2 by RT PCR NEGATIVE NEGATIVE Final    Comment: (NOTE) SARS-CoV-2 target nucleic acids are NOT DETECTED.  The SARS-CoV-2 RNA is generally detectable in upper respiratory specimens during the acute phase of infection. The  lowest concentration of SARS-CoV-2 viral copies this assay can detect is 138 copies/mL. A negative result does not preclude SARS-Cov-2 infection and should not be used as the sole basis for treatment or other patient management decisions. A negative result may occur with  improper specimen collection/handling, submission of specimen other than nasopharyngeal swab, presence of viral mutation(s) within the areas targeted by this assay, and inadequate number of viral copies(<138 copies/mL). A negative result must be combined with clinical observations, patient history, and epidemiological information. The expected result is Negative.  Fact Sheet for Patients:  bloggercourse.com  Fact Sheet for Healthcare Providers:  seriousbroker.it  This test is no t yet approved or cleared by the [place]  FDA and  has been authorized for detection and/or diagnosis of SARS-CoV-2 by FDA under an Emergency Use Authorization (EUA). This EUA will remain  in effect (meaning this test can be used) for the duration of the COVID-19 declaration under Section 564(b)(1) of the Act, 21 U.S.C.section 360bbb-3(b)(1), unless the authorization is terminated  or revoked sooner.       Influenza A by PCR NEGATIVE NEGATIVE  Final   Influenza B by PCR NEGATIVE NEGATIVE Final    Comment: (NOTE) The Xpert Xpress SARS-CoV-2/FLU/RSV plus assay is intended as an aid in the diagnosis of influenza from Nasopharyngeal swab specimens and should not be used as a sole basis for treatment. Nasal washings and aspirates are unacceptable for Xpert Xpress SARS-CoV-2/FLU/RSV testing.  Fact Sheet for Patients: bloggercourse.com  Fact Sheet for Healthcare Providers: seriousbroker.it  This test is not yet approved or cleared by the [place]  FDA and has been authorized for detection and/or diagnosis of SARS-CoV-2 by FDA under  an Emergency Use Authorization (EUA). This EUA will remain in effect (meaning this test can be used) for the duration of the COVID-19 declaration under Section 564(b)(1) of the Act, 21 U.S.C. section 360bbb-3(b)(1), unless the authorization is terminated or revoked.     Resp Syncytial Virus by PCR NEGATIVE NEGATIVE Final    Comment: (NOTE) Fact Sheet for Patients: bloggercourse.com  Fact Sheet for Healthcare Providers: seriousbroker.it  This test is not yet approved or cleared by the [place]  FDA and has been authorized for detection and/or diagnosis of SARS-CoV-2 by FDA under an Emergency Use Authorization (EUA). This EUA will remain in effect (meaning this test can be used) for the duration of the COVID-19 declaration under Section 564(b)(1) of the Act, 21 U.S.C. section 360bbb-3(b)(1), unless the authorization is terminated or revoked.  Performed at [facility], [address], [place], [place] [address]   CSF culture w Gram Stain     Status: None (Preliminary result)   Collection Time: 04/07/23  1:25 PM   Specimen: CSF; Cerebrospinal Fluid  Result Value Ref Range Status   Specimen Description CSF  Final   Special Requests NONE  Final   Gram Stain   Final    WBC PRESENT,BOTH PMN AND MONONUCLEAR NO ORGANISMS SEEN CYTOSPIN SMEAR Performed at [facility], [address], [place], [place] [address]    Culture PENDING  Incomplete   Report Status PENDING  Incomplete    Radiology Studies:  ECHOCARDIOGRAM LIMITED Result Date: 04/08/2023    ECHOCARDIOGRAM LIMITED REPORT   Patient Name:   Curtis Campos Date of Exam: 04/08/2023 Medical Rec #:  Campos        Height:       66.0 in Accession #:    [id number]       Weight:       149.0 lb Date of Birth:  Mar 05, 1941        BSA:          1.765 m Patient Age:    82 years         BP:           140/66 mmHg Patient Gender: M                HR:           66 bpm. Exam Location:   Inpatient Procedure: Limited Echo, Color Doppler and Cardiac Doppler Indications:    Afib I48.91                 Eval TAVR, LVEF  History:        Patient has prior history of Echocardiogram examinations, most                 recent 11/18/2022.                 Aortic Valve: 26 mm Sapien prosthetic, stented (TAVR) valve is  present in the aortic position.  Sonographer:    Tinnie Gosling RDCS Referring Phys: [id number] SUNIT TOLIA IMPRESSIONS  1. Limited echo for LVEF an TAVR evaluation  2. Left ventricular ejection fraction, by estimation, is 45 to 50%. Left ventricular ejection fraction by 2D MOD biplane is 45.1 %. The left ventricle has mildly decreased function. The left ventricle demonstrates global hypokinesis. There is mild left ventricular hypertrophy.  3. The mitral valve is abnormal. Mild mitral valve regurgitation.  4. Valve was not completely interrogated- however, there does not apperar to be an increased gradient. The aortic valve has been repaired/replaced. There is a 26 mm Sapien prosthetic (TAVR) valve present in the aortic position. Aortic valve area, by VTI  measures 2.15 cm. Aortic valve mean gradient measures 5.0 mmHg. Aortic valve Vmax measures 1.67 m/s. Comparison(s): No significant change from prior study. 11/18/2022: LVEF 45-50%, 26 mm TAVR with MG 6 mmHg. FINDINGS  Left Ventricle: Left ventricular ejection fraction, by estimation, is 45 to 50%. Left ventricular ejection fraction by 2D MOD biplane is 45.1 %. The left ventricle has mildly decreased function. The left ventricle demonstrates global hypokinesis. There is mild left ventricular hypertrophy. Mitral Valve: The mitral valve is abnormal. Mild to moderate mitral annular calcification. Mild mitral valve regurgitation. Aortic Valve: Valve was not completely interrogated- however, there does not apperar to be an increased gradient. The aortic valve has been repaired/replaced. Aortic valve mean gradient measures 5.0 mmHg. Aortic  valve peak gradient measures 11.2 mmHg. Aortic valve area, by VTI measures 2.15 cm. There is a 26 mm Sapien prosthetic, stented (TAVR) valve present in the aortic position. Aorta: The aortic root and ascending aorta are structurally normal, with no evidence of dilitation. LEFT VENTRICLE PLAX 2D                        Biplane EF (MOD) LVIDd:         5.30 cm         LV Biplane EF:   Left LVIDs:         4.10 cm                          ventricular LV PW:         1.20 cm                          ejection LV IVS:        1.10 cm                          fraction by LVOT diam:     2.30 cm                          2D MOD LV SV:         74                               biplane is LV SV Index:   42                               45.1 %. LVOT Area:     4.15 cm  LV Volumes (MOD) LV vol d, MOD    144.3 ml A2C:  LV vol d, MOD    139.9 ml A4C: LV vol s, MOD    80.9 ml A2C: LV vol s, MOD    69.9 ml A4C: LV SV MOD A2C:   63.4 ml LV SV MOD A4C:   139.9 ml LV SV MOD BP:    65.1 ml AORTIC VALVE AV Area (Vmax):    1.72 cm AV Area (Vmean):   1.82 cm AV Area (VTI):     2.15 cm AV Vmax:           167.00 cm/s AV Vmean:          107.000 cm/s AV VTI:            0.346 m AV Peak Grad:      11.2 mmHg AV Mean Grad:      5.0 mmHg LVOT Vmax:         69.20 cm/s LVOT Vmean:        46.900 cm/s LVOT VTI:          0.179 m LVOT/AV VTI ratio: 0.52  SHUNTS Systemic VTI:  0.18 m Systemic Diam: 2.30 cm Curtis Maxcy MD Electronically signed by Curtis Maxcy MD Signature Date/Time: 04/08/2023/11:51:38 AM    Final    Overnight EEG with video Result Date: 04/07/2023 Shelton Curtis KIDD, MD     04/08/2023 10:30 AM Patient Name: Curtis Campos Epilepsy Attending: Arlin Campos Shelton Referring Physician/Provider: Voncile Isles, MD Duration: 04/06/2023 2041 to 04/07/2023 2041  Patient history: 83 year old male with stage II lung adenocarcinoma plans for radiation who presented with altered mental status and proceeded to have focal seizures which then  generalized. EEG to evaluate for seizure  Level of alertness: Awake, asleep  AEDs during EEG study: LEV  Technical aspects: This EEG study was done with scalp electrodes positioned according to the 10-20 International system of electrode placement. Electrical activity was reviewed with band pass filter of 1-70Hz , sensitivity of 7 uV/mm, display speed of 54mm/sec with a 60Hz  notched filter applied as appropriate. EEG data were recorded continuously and digitally stored.  Video monitoring was available and reviewed as appropriate.  Description: No clear posterior dominant rhythm was seen. Sleep was characterized by sleep spindles (12 to 14 Hz), maximal frontocentral region. EEG showed 3-5hz  theta-delta slowing with overriding 13 to 15 Hz beta activity. Sharp waves were noted in the hemisphere, maximal left frontal temporal region which at times appeared periodic at 1 Hz.  Gradually the sharp waves evolved into generalized periodic discharges with triphasic morphology at 1 Hz.  After around 10 AM, patient was noted to have generalized periodic discharges (GPD) with triphasic morphology with fluctuating frequency.  Once every few hours, EEG showed Gpds at 2-2.5hz  with overlying rhythmicity and fast activity lasting 1 to 2 minutes without definite evolution.  This EEG pattern then became more frequent and was noted 3-6 times every hour.  No clinical signs were seen during this EEG pattern. Hyperventilation and photic stimulation were not performed.    ABNORMALITY - Sharp waves, left hemisphere, maximal left frontotemporal region -Periodic discharges with triphasic morphology, generalized - Continuous slow, generalized and lateralized left hemisphere  IMPRESSION: This study initially showed evidence of epileptogenicity and cortical dysfunction arising from left hemisphere, maximal in frontotemporal region. Additionally there is moderate diffuse encephalopathy.  No definite seizures were seen during this time. Gradually,  EEG worsened and showed generalized periodic discharges with triphasic morphology and fluctuating frequency of 1 to 2.5 Hz with overlying rhythmicity lasting 1 to  2 minutes.  This EEG pattern is on the ictal -interictal continuum with increased risk of seizures.  Curtis Campos   EEG adult Result Date: 04/06/2023 Campos Curtis MALVA, MD     04/06/2023  2:24 PM Patient Name: Curtis Campos Epilepsy Attending: Arlin MALVA Campos Referring Physician/Provider: Keturah Carrier, MD Date: 04/06/2023 Duration: 22.10 mins Patient history: 83 year old male with stage II lung adenocarcinoma plans for radiation who presented with altered mental status and proceeded to have focal seizures which then generalized. EEG to evaluate for seizure Level of alertness: Awake, asleep AEDs during EEG study: LEV, Ativan  Technical aspects: This EEG study was done with scalp electrodes positioned according to the 10-20 International system of electrode placement. Electrical activity was reviewed with band pass filter of 1-70Hz , sensitivity of 7 uV/mm, display speed of 80mm/sec with a 60Hz  notched filter applied as appropriate. EEG data were recorded continuously and digitally stored.  Video monitoring was available and reviewed as appropriate. Description: No clear posterior dominant rhythm was seen. Sleep was characterized by sleep spindles (12 to 14 Hz), maximal frontocentral region. EEG showed 3-5hz  theta-delta slowing with overriding 13 to 15 Hz beta activity. Sharp waves were noted in the hemisphere, maximal left frontal temporal region, at times quasi periodic at 1 Hz.  Hyperventilation and photic stimulation were not performed.   ABNORMALITY - Sharp waves, left hemisphere, maximal left frontotemporal region - Continuous slow, generalized and lateralized left hemisphere IMPRESSION: This study showed evidence of epileptogenicity and cortical dysfunction arising from left hemisphere, maximal in frontotemporal region.   Additionally there is moderate diffuse encephalopathy.  No definite seizures were seen during this time. Recommend long-term EEG for further evaluation. Priyanka O Yadav    Scheduled Meds:    aspirin   81 mg Oral QHS   Chlorhexidine  Gluconate Cloth  6 each Topical Daily   feeding supplement  237 mL Oral BID BM   folic acid   1 mg Oral Daily   guaiFENesin   600 mg Oral BID   lacosamide   100 mg Oral BID   metoprolol  succinate  50 mg Oral Daily   mometasone -formoterol   2 puff Inhalation BID   sacubitril -valsartan   1 tablet Oral BID   sodium chloride  flush  3 mL Intravenous Q12H   sodium chloride   1 g Oral BID WC   thiamine  (VITAMIN B1) injection  100 mg Intravenous Daily   Or   thiamine   100 mg Oral Daily   torsemide   20 mg Oral Daily    Continuous Infusions:    sodium chloride  Stopped (04/07/23 1812)   acyclovir  750 mg (04/08/23 1131)   ampicillin  (OMNIPEN) IV 2 g (04/08/23 1251)   cefTRIAXone  (ROCEPHIN )  IV 2 g (04/08/23 1144)   heparin  1,600 Units/hr (04/08/23 1242)   valproate sodium      vancomycin  1,000 mg (04/08/23 0412)     LOS: 2 days     Trenda Mar, MD,  FACP, [facility], [facility], [facility]   [facility] [place]      To contact the attending provider between 7A-7P or the covering provider during after hours 7P-7A, please log into the web site [email or web address] and access using universal  password for that web site. If you do not have the password, please call the hospital operator.  04/08/2023, 1:07 PM

## 2023-04-08 NOTE — Progress Notes (Signed)
  Inpatient Rehab Admissions Coordinator :  Per  OT therapy recommendations patient was screened for CIR candidacy by Heron Leavell RN MSN.  PT recommends SNF. Patient is not at a level to tolerate the intensity required to pursue a CIR admit .Other rehab venues should be pursued. Please contact me with any questions.  Heron Leavell RN MSN Admissions Coordinator [phone]

## 2023-04-08 NOTE — Progress Notes (Signed)
 PT Cancellation Note  Patient Details Name: Curtis Campos MRN: [id number] DOB: [birth year]   Cancelled Treatment:    Reason Eval/Treat Not Completed: (P) Medical issues which prohibited therapy Pt is on bedrest s/p lumbar puncture. PT will follow back for Eval later this afternoon as able.  Dashay Giesler B. Fleeta Lapidus PT, DPT Acute Rehabilitation Services Please use secure chat or  Call Office [phone]    Almarie KATHEE Fleeta Fleet 04/08/2023, 1:00 PM

## 2023-04-08 NOTE — Evaluation (Signed)
 Physical Therapy Evaluation Patient Details Name: Curtis Campos MRN: [id number] DOB: 1940-04-27 Today's Date: 04/08/2023  History of Present Illness  Curtis Campos is a 83 yo male who initially presented to [facility] with AMS, he had a witnessed seizure and was transferred to [facility] for LT EEG monitoring. LP planned 1/9. PMHx: stage II lung adenocarcinoma, rectal cancer status post colostomy, aortic stenosis status post bioprosthetic TAVR, coronary artery disease, chronic systolic heart failure, hypertension, hyperlipidemia, COPD, chronic hypoxic respiratory failure on 4 L O2 at baseline, HLD, paroxysmal atrial fibrillation and chronic hyponatremia.  Clinical Impression  Pt obtunded on entry, rouses but falls back asleep in between questions. Pt able to move B LE against gravity but refuses any further mobilization and goes back to sleep. Patient will benefit from continued inpatient follow up therapy, <3 hours/day.  PT will follow back later for increased assessment when more awake.         If plan is discharge home, recommend the following: Two people to help with walking and/or transfers;Two people to help with bathing/dressing/bathroom;Assistance with cooking/housework;Assistance with feeding;Direct supervision/assist for medications management;Direct supervision/assist for financial management;Assist for transportation;Help with stairs or ramp for entrance;Supervision due to cognitive status   Can travel by private vehicle   No    Equipment Recommendations Other (comment) (TBD)     Functional Status Assessment Patient has had a recent decline in their functional status and demonstrates the ability to make significant improvements in function in a reasonable and predictable amount of time.     Precautions / Restrictions Precautions Precautions: Fall Precaution Comments: LTM EEG, seizure Restrictions Weight Bearing Restrictions Per Provider Order: No          Pertinent  Vitals/Pain Pain Assessment Pain Assessment: No/denies pain    Home Living Family/patient expects to be discharged to:: Private residence Living Arrangements: Spouse/significant other Available Help at Discharge: Family;Available 24 hours/day Type of Home: House Home Access: Stairs to enter   [employer] of Steps: 4 (ramp currently being built)   Home Layout: One level Home Equipment: Rollator (4 wheels);Shower seat;Grab bars - tub/shower;Wheelchair - manual      Prior Function Prior Level of Function : Independent/Modified Independent             Mobility Comments: ambulates with rollator, 1 recent fall last friday ADLs Comments: mod I, drives short distances. assists wife with housework. manages his own meds     Extremity/Trunk Assessment   Upper Extremity Assessment Upper Extremity Assessment: Defer to OT evaluation LUE Deficits / Details: Chronic L shoulder immobility and pain with movement, compensatory movement patterns noted. Elbow, wrist and hand seem WFL. LUE Coordination: decreased gross motor    Lower Extremity Assessment Lower Extremity Assessment: Generalized weakness;Difficult to assess due to impaired cognition (able to move both extermities against gravity)    Cervical / Trunk Assessment Cervical / Trunk Assessment: Kyphotic (mild)  Communication   Communication Communication: No apparent difficulties  Cognition Arousal: Obtunded Behavior During Therapy: Flat affect Overall Cognitive Status: Impaired/Different from baseline Area of Impairment: Orientation, Attention, Safety/judgement, Following commands, Awareness, Problem solving                 Orientation Level: Disoriented to, Situation, Time Current Attention Level: Focused   Following Commands: Follows one step commands inconsistently Safety/Judgement: Decreased awareness of safety, Decreased awareness of deficits Awareness: Intellectual Problem Solving: Decreased  initiation, Requires verbal cues, Difficulty sequencing General Comments: session limited by level of arousal, falls asleep in between questions  General Comments General comments (skin integrity, edema, etc.): BP elevated 160s/100s        Assessment/Plan    PT Assessment Patient needs continued PT services  PT Problem List Decreased strength;Decreased activity tolerance;Decreased balance;Decreased mobility;Decreased coordination;Decreased cognition;Decreased knowledge of use of DME;Decreased safety awareness;Decreased knowledge of precautions;Cardiopulmonary status limiting activity       PT Treatment Interventions DME instruction;Gait training;Stair training;Functional mobility training;Therapeutic activities;Therapeutic exercise;Balance training;Cognitive remediation;Patient/family education    PT Goals (Current goals can be found in the Care Plan section)  Acute Rehab PT Goals PT Goal Formulation: Patient unable to participate in goal setting Time For Goal Achievement: 04/22/23 Potential to Achieve Goals: Fair    Frequency Min 1X/week        AM-PAC PT 6 Clicks Mobility  Outcome Measure Help needed turning from your back to your side while in a flat bed without using bedrails?: Total Help needed moving from lying on your back to sitting on the side of a flat bed without using bedrails?: Total Help needed moving to and from a bed to a chair (including a wheelchair)?: Total Help needed standing up from a chair using your arms (e.g., wheelchair or bedside chair)?: Total Help needed to walk in hospital room?: Total Help needed climbing 3-5 steps with a railing? : Total 6 Click Score: 6    End of Session Equipment Utilized During Treatment: Oxygen  Activity Tolerance: Patient limited by lethargy Patient left: in bed;with call bell/phone within reach;with bed alarm set Nurse Communication: Mobility status;Other (comment) (request heels be floated or Prevalon boots be  ordered) PT Visit Diagnosis: Muscle weakness (generalized) (M62.81);Unsteadiness on feet (R26.81);Other abnormalities of gait and mobility (R26.89);History of falling (Z91.81);Difficulty in walking, not elsewhere classified (R26.2)    Time: 8466-8453 PT Time Calculation (min) (ACUTE ONLY): 13 min   Charges:   PT Evaluation $PT Eval Moderate Complexity: 1 Mod   PT General Charges $$ ACUTE PT VISIT: 1 Visit         Delara Shepheard B. Fleeta Lapidus PT, DPT Acute Rehabilitation Services Please use secure chat or  Call Office [phone]   Almarie KATHEE Fleeta [facility] 04/08/2023, 3:59 PM

## 2023-04-08 NOTE — Procedures (Addendum)
 Patient Name: Curtis Campos  MRN: [id number]  Epilepsy Attending: Arlin MALVA Krebs  Referring Physician/Provider: Voncile Isles, MD  Duration: 04/07/2023 2041 to 04/08/2023 2041   Patient history: 83 year old male with stage II lung adenocarcinoma plans for radiation who presented with altered mental status and proceeded to have focal seizures which then generalized. EEG to evaluate for seizure    Level of alertness: Lethargic   AEDs during EEG study: LEV   Technical aspects: This EEG study was done with scalp electrodes positioned according to the 10-20 International system of electrode placement. Electrical activity was reviewed with band pass filter of 1-70Hz , sensitivity of 7 uV/mm, display speed of 87mm/sec with a 60Hz  notched filter applied as appropriate. EEG data were recorded continuously and digitally stored.  Video monitoring was available and reviewed as appropriate.   Description: EEG showed 3-5hz  theta-delta slowing with overriding 13 to 15 Hz beta activity. Generalized periodic discharges (GPD) with triphasic morphology and fluctuating frequency between 1 to 2.5 Hz were also noted.  Average 6 times every hour, EEG showed generalized periodic discharges with triphasic morphology and frequency of 2 to 2.5 Hz with overlying rhythmicity but without definite evolution. This EEG pattern lasted for about 1 minute each time.  No clinical signs were seen during this EEG pattern. Hyperventilation and photic stimulation were not performed.      ABNORMALITY -Periodic discharges with triphasic morphology, generalized - Continuous slow, generalized    IMPRESSION: This study showed generalized periodic discharges with triphasic morphology and fluctuating frequency of 1 to 2.5 Hz with overlying rhythmicity.  This EEG pattern was noted average 6 times every hour lasting for about 1 minute each time. This pattern is on the ictal-interictal continuum with increased risk of seizures. Additionally there  is moderate diffuse encephalopathy.    Racquel Arkin O Aleksei Goodlin

## 2023-04-08 NOTE — Procedures (Signed)
 INTERVENTIONAL NEURORADIOLOGY BRIEF POSTPROCEDURE NOTE  FLUOROSCOPY GUIDED LUMBAR PUNCTURE  Attending physician: Curtis everitt Nile Lizzie, MD  Diagnosis: Altered mental status  Access site: L2-L3 level  Anesthesia: IR sedation: Moderate sedation.  Medication used: 0.5 mg Versed  IV; 25 mcg Fentanyl  IV.  Complications: None.  Estimated blood loss: None.  Specimen:  12 cc of clear CSF  Findings: Opening pressure 7 ccH2O. A total of 12 cc of clear CSF sent to lab in 4 tubes.  The patient tolerated the procedure well without incident or complication and is in stable condition.   PLAN: - Bed rest as per orders.

## 2023-04-08 NOTE — Progress Notes (Addendum)
 Patient Name: Curtis Campos Date of Encounter: 04/08/2023 Curtis Campos Cardiologist: Deatrice Cage, MD   Interval Summary  .    Pleasantly confused does struggles to follow simple commands.  Has some audible gurgling.  However denies any shortness of breath or chest pain. Vital Signs .    Vitals:   04/08/23 1035 04/08/23 1040 04/08/23 1045 04/08/23 1139  BP: (!) 126/92 (!) 141/83 (!) 121/99 (!) 156/95  Pulse: 64 67 64 70  Resp: (!) 23 (!) 26 20 16   Temp:    98.5 F (36.9 C)  TempSrc:    Oral  SpO2: 99% 100% 100% 94%  Weight:      Height:        Intake/Output Summary (Last 24 hours) at 04/08/2023 1325 Last data filed at 04/08/2023 0000 Gross per 24 hour  Intake 1750.41 ml  Output 1500 ml  Net 250.41 ml      04/08/2023    4:18 AM 04/06/2023    4:30 AM 04/05/2023    9:51 PM  Last 3 Weights  Weight (lbs) 149 lb 0.5 oz 166 lb 0.1 oz 166 lb 1.6 oz  Weight (kg) 67.6 kg 75.3 kg 75.342 kg      Telemetry/ECG    Sinus heart rates in the 60s with PVCs.  No afib noted since transfer - Personally Reviewed  Physical Exam .   GEN: Age-appropriate, hemodynamically stable, confused.  Neck: No JVD Cardiac: RRR, no murmurs, rubs, or gallops.  Respiratory: faint end-inspiratory wheezing, diminished breath sounds, poor respiratory effort GI: Soft, nontender, non-distended  MS: No edema  Assessment & Plan .     [name] is a 83 y.o. male with a hx of with hx stage II lung adenocarcinoma currently planned for upcoming radiation therapy, remote history of rectal cancer status post colostomy, aortic stenosis status post bioprosthetic TAVR 8/2023coronary artery disease, heart failure with ejection fraction 45-50% 10/2022, hypertension-recent hypotension, hyperlipidemia, COPD, chronic hypoxic respiratory failure on 4 L O2, hyponatremia,   who is being seen for the evaluation of Afib with RVR and possible ischemic EKG changes/hypoxia s/p synchronized cardioversion in ED x2 at the  request of Dr. Sebastian. Patient brought to [facility] ED after family noted confusion with possible seizure activity.  Currently being worked up for possible meningitis and has planned LP today.  PAF History of this but has not been on DOAC before.  Initially was in A-fib RVR with ST depressions which were felt to be ischemic.  He was cardioverted x2.  Patient started on amiodarone  and heparin .  EKG changes resolved once back in sinus rhythm. Since DCCV/arriving to [facility], no evidence of recurrent afib.  Maintaining NSR.  Heparin  now on hold this morning with patient pending LP. Will need to resume heparin ->transition to DOAC once cleared by neurology.  Amiodarone  stopped due to underlying pulmonary disease and need for anti-seizure treatment, potential for interaction is high.  Carvedilol  stopped due to low BP.  Rate well-controlled on Lopressor  25 mg twice daily, transition this to Toprol -XL 50 mg. TSH normal this admission  Coronary artery disease Single-vessel CAD in mid LCx per angiography May 2023 found to be unchanged from January 2015. Despite RVR and seizure, patient with negative troponin. No chest pain reported.  Continue medical management including Praluent . Once on DOAC, would stop ASA.   Chronic HFmrEF Echo August 2024 showed EF 45 to 50%, global hypokinesis, Grade I DD. Patient with baseline shortness of breath.  Asymptomatic, euvolemic.  Preliminary review  of bedside echocardiogram shows generally same EF  Continue Entresto , Torsemide  20mg . Resume Farxiga  at DC.  Now will be on Toprol -XL 50 mg for GDMT.  Severe aortic stenosis s/p TAVR 08/20 November 2022 showed normal structure and function of aortic valve prosthesis without regurgitation or stenosis, mean gradient 6 mmHg.   Continue serial imaging to monitor valve function  COPD  Per primary team.  Chronically is on supplemental oxygen   New onset seizures Neurology following.  Undergo LP today.   Signed, Thom Sluder,  PA-C  For questions or updates, please contact  Campos Please consult [email or web address] for contact info under     ADDENDUM:   Patient seen and examined with [name] PA-C.  I personally taken a history, examined the patient, reviewed relevant notes,  laboratory data / imaging studies.  I performed a substantive portion of this encounter and formulated the important aspects of the plan.  I agree with the APP's note, impression, and recommendations; however, I have edited the note to reflect changes or salient points.   Patient seen and examined at bedside. He is status post lumbar puncture. Resting in bed comfortably still postanesthesia. No family present at bedside. Overall clinical trajectory over the last 24 hours reviewed with nursing staff. Telemetry notes sinus rhythm without any reoccurrence of A-fib.  PHYSICAL EXAM: Today's Vitals   04/08/23 1035 04/08/23 1040 04/08/23 1045 04/08/23 1139  BP: (!) 126/92 (!) 141/83 (!) 121/99 (!) 156/95  Pulse: 64 67 64 70  Resp: (!) 23 (!) 26 20 16   Temp:    98.5 F (36.9 C)  TempSrc:    Oral  SpO2: 99% 100% 100% 94%  Weight:      Height:      PainSc:   0-No pain    Body mass index is 24.05 kg/m.   Net IO Since Admission: -1,105.64 mL [04/08/23 1325]  Filed Weights   04/05/23 2151 04/06/23 0430 04/08/23 0418  Weight: 75.3 kg 75.3 kg 67.6 kg    Physical Exam  Constitutional: He appears chronically ill.  hemodynamically stable, appears older than stated age  Neck: No JVD present.  Cardiovascular: Normal rate, regular rhythm, S1 normal and S2 normal. Exam reveals no gallop, no S3 and no S4.  No murmur heard. Pulmonary/Chest: No stridor. He has no wheezes. He has no rales.  decreased breath sounds at the bases.  On nasal cannula oxygen .  Abdominal: Soft. Bowel sounds are normal. He exhibits no distension. There is no abdominal tenderness.  Musculoskeletal:        General: No edema.  Neurological:  sleeping   Skin: Skin is warm.    EKG: (personally reviewed by me) 04/07/2023: Sinus rhythm with sinus arrhythmia, left axis, without underlying injury pattern.  Telemetry: (personally reviewed by me) Predominantly sinus rhythm.  Limited Echo 04/08/2023 LVEF 45-50%, mildly reduced LVEF with global hypokinesis. Mild MR. 26 mm SAPIEN TAVR valve in place, based on the limited interrogation no evidence of mitral valve stenosis (mean gradient 5 mmHg).  When compared to prior study from August 2024 LVEF was 45-50%, aortic valve mean gradient status post TAVR was 6 mmHg.   Impression & Recommendations:  Atrial fibrillation with rapid ventricular rate. Persistent atrial fibrillation Atrial fibrillation during this hospitalization likely precipitated by confusion, seizure-like activities (POA), other conditions. Status post cardioversion times 04/06/2023 Based on EMR-she presented to the hospital with altered mental status and seizure-like activity at home.  While in the ED he went into A-fib with RVR requiring  direct-current cardioversion x 2 and was subsequently started on IV heparin  and amiodarone  drips. Since then he has remained in normal sinus rhythm/sinus tachycardia. Started on IV heparin  - currently held as he just had lumbar punter.  PLEASE restart AC when safe post procedure and will need to sent home on oral anticoagulation (if safe) after discussing risk and befenit ratio as he is s/p cardioversion and high Chad Vasc score.  Amiodarone  has been held for now and would like to avoid long term use due to history of lung cancer and chronic respiratory failure on home O2. Transitioning to Toprol  XL.  Limited echocardiogram reviewed - no significant change.    Coronary artery disease: Patient was not endorsing any anginal discomfort prior to this event. EKG: Independently reviewed as noted above.  Echocardiogram notes mildly reduced LVEF without any significant change compared to prior study.   High  sensitive troponins remain negative x 2 No plans for invasive cardiovascular testing at this time. Continue to monitor risk factors.   HFmrEF:  Medications were down titrated as outpatient due to soft blood pressures. Will focus on the acute illnesses/problem list and uptitrate medical therapy as hemodynamics and laboratory values allow and readdress closer to discharge.    History of aortic stenosis status post TAVR: Normal valve function as of echo 10/2022.   Altered mental status: Currently being addressed by neurology and primary team. Had lumbar puncture today 04/08/2023 Currently on antibiotics and antiretrovirals. Primary neurology following.  From a cardiovascular standpoint no additional recommendations warranted at this time.   He has remained in normal sinus rhythm since his cardioversion.  Have held amiodarone  for reasons mentioned above.  Continue rate control strategy.  And resume anticoagulation once.  By interventional radiology as he is status post lumbar puncture and neurology.  Based on clinical trajectory he can be transition to oral anticoagulation when it is deemed safe.   Will arrange outpatient follow-up with Dr.Arida's office given his comorbid cardiovascular conditions.   Please reach out if questions or concerns arise during his hospitalization.  Further recommendations to follow as the case evolves.   This note was created using a voice recognition software as a result there may be grammatical errors inadvertently enclosed that do not reflect the nature of this encounter. Every attempt is made to correct such errors.   Madonna Large, DO, [facility]  [facility]  [address] [place], [place] [address] Pager: [phone] Office: [phone] 04/08/2023 1:25 PM

## 2023-04-08 NOTE — Progress Notes (Signed)
 Pharmacy Antibiotic Note  Curtis Campos is a 83 y.o. male admitted on 04/05/2023 with aspiration pneumonia.  Pharmacy has been consulted for Unasyn  dosing.  Plan: Unasyn  3g IV q6h Follow up renal function, cultures as available, clinical progress, length of tx  Height: 5' 6 (167.6 cm) Weight: 67.6 kg (149 lb 0.5 oz) IBW/kg (Calculated) : 63.8  Temp (24hrs), Avg:98.2 F (36.8 C), Min:97.6 F (36.4 C), Max:99.2 F (37.3 C)  Recent Labs  Lab 04/05/23 1902 04/06/23 0448 04/07/23 0536 04/07/23 0630 04/08/23 0715 04/08/23 0921  WBC 18.6* 18.1*  --  23.9*  --  18.4*  CREATININE 0.77 0.75 0.74  --  0.61  --     Estimated Creatinine Clearance: 64.2 mL/min (by C-G formula based on SCr of 0.61 mg/dL).    Allergies  Allergen Reactions   Codeine Nausea Only   Statins Other (See Comments)    REACTION: myalgias    Antimicrobials this admission: 1/8 vanc>>1/9 1/8 ceftriaxone >>1/9 1/8 ampicillin >>1/9 1/8 acyclovir >>1/9 1/9 unasyn  >>  Dose adjustments this admission:  Microbiology results: 1/9 meningitis panel neg   Thank you for allowing pharmacy to be a part of this patient's care.  Rocky Slade, PharmD, BCPS 04/08/2023 3:29 PM  Please check AMION for all [facility] Pharmacy phone numbers After 10:00 PM, call Main Pharmacy [phone]

## 2023-04-08 NOTE — Progress Notes (Signed)
 Dr KYM Hurst at bedside to assess. His O2 sats began to decrease on 10-13 L  HFNC. He was staying 83-86 % and not recovering. Also noted to be lethargic. At that time, Notified RT and placed on Venti mask 14/55. His sats now are 86-88%. ABG was completed.  He follows commands but remains lethargic and delayed responses. Dr Hurst VO to place him on bipap so notified Hansen, RT .

## 2023-04-08 NOTE — Progress Notes (Addendum)
 PHARMACY - ANTICOAGULATION CONSULT NOTE  Pharmacy Consult for IV heparin  Indication: atrial fibrillation  Allergies  Allergen Reactions   Codeine Nausea Only   Statins Other (See Comments)    REACTION: myalgias    Patient Measurements: Height: 5' 6 (167.6 cm) Weight: 67.6 kg (149 lb 0.5 oz) IBW/kg (Calculated) : 63.8 Heparin  Dosing Weight: 75.3 kg  Vital Signs: Temp: 98.5 F (36.9 C) (01/09 1139) Temp Source: Oral (01/09 1139) BP: 156/95 (01/09 1139) Pulse Rate: 70 (01/09 1139)  Labs: Recent Labs    04/05/23 1902 04/05/23 2049 04/06/23 0448 04/06/23 1158 04/07/23 0536 04/07/23 0603 04/07/23 0630 04/07/23 2206 04/08/23 0715 04/08/23 0921  HGB 8.8*  --  8.8*  --   --   --  8.8*  --   --  8.7*  HCT 29.0*  --  27.7*  --   --   --  27.3*  --   --  26.2*  PLT 574*  --  574*  --   --   --  586*  --   --  548*  LABPROT 15.4*  --   --   --   --   --   --   --   --   --   INR 1.2  --   --   --   --   --   --   --   --   --   HEPARINUNFRC  --   --   --    < >  --  <0.10*  --  0.13*  --  <0.10*  CREATININE 0.77  --  0.75  --  0.74  --   --   --  0.61  --   CKTOTAL  --  46*  --   --   --   --   --   --   --   --   TROPONINIHS  --   --  16  --   --   --   --   --   --   --    < > = values in this interval not displayed.    Estimated Creatinine Clearance: 64.2 mL/min (by C-G formula based on SCr of 0.61 mg/dL).   Medical History: Past Medical History:  Diagnosis Date   Agent orange exposure    Anemia    Anxiety    Aortic atherosclerosis (HCC)    Aortic stenosis 07/04/2013   a.)TTE 07/04/13: mild (MPG 12; AVA 1.6); b.) TTE 01/01/16: mod (MPG 14; AVA 1.17); c.) TTE 08/30/17: mod (MPG 15; AVA 1.25); d.) TTE 07/13/19: mod (MPG 19; AVA 1.16); e.) TTE 07/23/20: mod (MPG 22; AVA 1.25); f.) TTE 03/12/2021: mod-sev (MPH 34; AVA 0.75); g.) TTE 05/21/2021: mod (MPG 22; AVA 0.8); h.) RHC 08/27/2020: sev (MPG 26.18; AVA 0.69); i.) s/p TAVR 11/25/21: 26 mm Edwards S3 Ultra Resilia    Arthritis 03/12/2011   Cardiomegaly    Cerebral microvascular disease    Cholelithiasis    Chronic hyponatremia    Chronic obstructive lung disease (HCC)    Colostomy in place [facility])    a.) s/p resection secondary to rectal cancer   Coronary artery disease 03/2013   a.) LHC 04/10/2013: 10% mLAD, 50% D1, 30% D2, 99% mLCx, 50% OM1, 40% pRCA, 30% mRCA, 60% RPDA, 40% RPLS - med mgmt; b.) MV 08/31/2019: basal inflat/mid inflat defect c/w ischemia in LCx distribution. EF 37%; c.) R/LHC 08/27/2020: 50% D1, 50% D2, 90% mLCx, 40% p-mRCA, 80% RPDA -  med mgmt   Former smoker    HFrEF (heart failure with reduced ejection fraction) (HCC)    a.) TEE 04/13/13: EF 20-25, glob HK, G1DD, mild LAE, mod-sev MR; b.) TTE 08/30/17: EF 45-50, LVH, mild LAE, G1DD, mod AS, mod MR; c.) TTE 07/13/19: EF 30-35, glob HK, G1DD, RVSP 49.4, mild LAE, mild-mod MR, mod AS; d.) TTE 03/12/21: EF 55, G2DD, mild MR, mod-sev AS; e.) TTE 12/24/21: EF 50, mild LVH, G1DD, RVSP 40.5, mild-mod MR, triv AR; f.) TTE 11/18/22: EF 45-50, glob HK, G1DD, mild MR   History of bilateral cataract extraction    Hyperlipidemia    Hypertension    Hypokalemia    Ischemic cardiomyopathy    a.) TTE 04/08/2014: EF 25-30%; b.) TEE 04/13/2013: EF 20-25%; c.) TTE 07/04/2013: EF 30-35%; d.) TTE 01/01/2016: EF 40-45%; e.) TTE 08/30/2017: EF 45-50%; f.) TTE 07/03/2019: EF 30-35%; g.) MV 08/31/2019: EF 37%; h.) TTE 07/23/20: EF 55-60%; i.) TTE 03/12/2021: EF 55%; j.) TTE 05/21/2021: EF 40-45%; k.) TTE 11/26/2021: EF 45-50%; l.) TTE 12/24/2021: EF 50%; m.) TTE 11/18/2022: EF 45-50%   Long term (current) use of aspirin     Mass of upper lobe of left lung 03/10/2021   a.) CTA chest 03/10/2021: 6.1 mm; b.) CTA chest 10/15/2021: 8 mm; c.) PET CT 11/14/2021: 8 mm (SUV max 2.8); d.) CXR 10/05/2022: interval increase to 2.7 cm; e.) CXR 01/18/2023: interval increase to 3.7 cm; f.) CT chest 01/22/2023: 3.8 x 3.2 cm; g.) PET CT 02/01/2023: 3.8 cm and markedly hypermetabolic with  SUV max of 43   Multifocal atrial tachycardia (HCC)    Myocardial infarction (HCC)    a.) suspected by cardiology during 03/2013 admission for CP/CAP/new onset CHF --> TTE  showed severe LV dysfunction with segmental wall motion abnormalities and severe MR (posteriorly-directed  jet)   Pneumonia    Pre-diabetes    Pulmonary hypertension (HCC)    a.) TTE 04/08/2013: RVSP 62.2; b.) TTE 07/13/2019: RVSP 49.4; c.) TTE 07/23/2020: RVSP 33.2; d.) TTE 11/26/2021: RVSP 41.2; e.) TTE 12/24/2021: RVSP 40.5; f.) TTE 11/18/2019: RVSP 29.8   Rectal cancer (HCC)    Requires continuous at home supplemental oxygen     a.) 4L/   S/P TAVR (transcatheter aortic valve replacement) 11/25/2021   a.) 26mm Edwards Sapien 3 Ultra Resilia via the TF approach   Statin intolerance    Umbilical hernia (s/p repair)    Assessment: Curtis Campos is a 83 y.o. year old male admitted on 04/05/2023 with concern for new afib RVR. No anticoagulation prior to admission. Pharmacy consulted to dose heparin .  Pt is s/p LP. Ok to resume heparin  per Dr. De Macedo.    Goal of Therapy:  Heparin  level 0.3-0.7 units/ml Monitor platelets by anticoagulation protocol: Yes   Plan:  Resume heparin  1600 units/hr Check 8 hour heparin  level Daily heparin  level, CBC, and monitoring for bleeding F/u plans for anticoagulation   Sergio Batch, PharmD, BCIDP, AAHIVP, CPP Infectious Disease Pharmacist 04/08/2023 12:42 PM

## 2023-04-09 ENCOUNTER — Inpatient Hospital Stay (HOSPITAL_COMMUNITY): Payer: No Typology Code available for payment source

## 2023-04-09 DIAGNOSIS — I5043 Acute on chronic combined systolic (congestive) and diastolic (congestive) heart failure: Secondary | ICD-10-CM

## 2023-04-09 DIAGNOSIS — J9621 Acute and chronic respiratory failure with hypoxia: Secondary | ICD-10-CM | POA: Diagnosis not present

## 2023-04-09 DIAGNOSIS — J690 Pneumonitis due to inhalation of food and vomit: Secondary | ICD-10-CM

## 2023-04-09 DIAGNOSIS — G9341 Metabolic encephalopathy: Secondary | ICD-10-CM | POA: Diagnosis not present

## 2023-04-09 DIAGNOSIS — R569 Unspecified convulsions: Secondary | ICD-10-CM

## 2023-04-09 DIAGNOSIS — J9622 Acute and chronic respiratory failure with hypercapnia: Secondary | ICD-10-CM

## 2023-04-09 LAB — HEPARIN LEVEL (UNFRACTIONATED)
Heparin Unfractionated: 0.18 [IU]/mL — ABNORMAL LOW (ref 0.30–0.70)
Heparin Unfractionated: 0.33 [IU]/mL (ref 0.30–0.70)

## 2023-04-09 LAB — CBC
HCT: 26.5 % — ABNORMAL LOW (ref 39.0–52.0)
Hemoglobin: 8.6 g/dL — ABNORMAL LOW (ref 13.0–17.0)
MCH: 27 pg (ref 26.0–34.0)
MCHC: 32.5 g/dL (ref 30.0–36.0)
MCV: 83.3 fL (ref 80.0–100.0)
Platelets: 576 10*3/uL — ABNORMAL HIGH (ref 150–400)
RBC: 3.18 MIL/uL — ABNORMAL LOW (ref 4.22–5.81)
RDW: 15.1 % (ref 11.5–15.5)
WBC: 17 10*3/uL — ABNORMAL HIGH (ref 4.0–10.5)
nRBC: 0 % (ref 0.0–0.2)

## 2023-04-09 LAB — BASIC METABOLIC PANEL
Anion gap: 11 (ref 5–15)
BUN: 12 mg/dL (ref 8–23)
CO2: 25 mmol/L (ref 22–32)
Calcium: 8.4 mg/dL — ABNORMAL LOW (ref 8.9–10.3)
Chloride: 93 mmol/L — ABNORMAL LOW (ref 98–111)
Creatinine, Ser: 0.71 mg/dL (ref 0.61–1.24)
GFR, Estimated: >60 mL/min (ref >60)
Glucose, Bld: 88 mg/dL (ref 70–99)
Potassium: 3.9 mmol/L (ref 3.5–5.1)
Sodium: 129 mmol/L — ABNORMAL LOW (ref 135–145)

## 2023-04-09 LAB — MISC LABCORP TEST (SEND OUT)
Labcorp test code: 505535
Labcorp test code: 505700

## 2023-04-09 LAB — BLOOD GAS, VENOUS
Acid-Base Excess: 1 mmol/L (ref 0.0–2.0)
Bicarbonate: 26 mmol/L (ref 20.0–28.0)
Drawn by: 71108
O2 Saturation: 94.9 %
Patient temperature: 36.6
pCO2, Ven: 41 mm[Hg] — ABNORMAL LOW (ref 44–60)
pH, Ven: 7.41 (ref 7.25–7.43)
pO2, Ven: 65 mm[Hg] — ABNORMAL HIGH (ref 32–45)

## 2023-04-09 LAB — VITAMIN B1: Vitamin B1 (Thiamine): 118.5 nmol/L (ref 66.5–200.0)

## 2023-04-09 LAB — VALPROIC ACID LEVEL: Valproic Acid Lvl: 28 ug/mL — ABNORMAL LOW (ref 50.0–100.0)

## 2023-04-09 LAB — CYTOLOGY - NON PAP

## 2023-04-09 MED ORDER — TORSEMIDE 20 MG PO TABS
20.0000 mg | ORAL_TABLET | Freq: Every day | ORAL | Status: DC
  Administered 2023-04-10: 20 mg via ORAL
  Filled 2023-04-09: qty 1

## 2023-04-09 MED ORDER — IPRATROPIUM-ALBUTEROL 0.5-2.5 (3) MG/3ML IN SOLN
3.0000 mL | RESPIRATORY_TRACT | Status: DC
  Administered 2023-04-09 – 2023-04-11 (×10): 3 mL via RESPIRATORY_TRACT
  Filled 2023-04-09 (×9): qty 3

## 2023-04-09 MED ORDER — FUROSEMIDE 10 MG/ML IJ SOLN
40.0000 mg | Freq: Once | INTRAMUSCULAR | Status: AC
  Administered 2023-04-09: 40 mg via INTRAVENOUS
  Filled 2023-04-09: qty 4

## 2023-04-09 MED ORDER — IMMUNE GLOBULIN (HUMAN) 10 GM/100ML IV SOLN
400.0000 mg/kg | INTRAVENOUS | Status: DC
  Administered 2023-04-09 – 2023-04-10 (×2): 30 g via INTRAVENOUS
  Filled 2023-04-09 (×4): qty 300

## 2023-04-09 MED ORDER — BUDESONIDE 0.5 MG/2ML IN SUSP
0.5000 mg | Freq: Two times a day (BID) | RESPIRATORY_TRACT | Status: DC
  Administered 2023-04-09 – 2023-04-11 (×5): 0.5 mg via RESPIRATORY_TRACT
  Filled 2023-04-09 (×5): qty 2

## 2023-04-09 NOTE — TOC CM/SW Note (Signed)
 Transition of Care [facility]) - Inpatient Brief Assessment   Patient Details  Name: Curtis Campos MRN: [id number] Date of Birth: 09/04/1940  Transition [facility]) CM/SW Contact:    Almarie CHRISTELLA Goodie, LCSW Phone Number: 04/09/2023, 12:38 PM   Clinical Narrative:   Patient from home with spouse, current recommendation for CIR vs SNF, awaiting medical stability. CSW to follow, will complete full assessment once medically stable for rehabilitation.    Transition of Care Asessment: Insurance and Status: Insurance coverage has been reviewed Patient has primary care physician: Yes Home environment has been reviewed: from home Prior level of function:: independent Prior/Current Home Services: No current home services Social Drivers of Health Review: SDOH reviewed no interventions necessary Readmission risk has been reviewed: Yes Transition of care needs: transition of care needs identified, TOC will continue to follow

## 2023-04-09 NOTE — Consult Note (Signed)
 NAME:  Curtis Campos, MRN:  [id number], DOB:  Aug 05, 1940, LOS: 3 ADMISSION DATE:  04/05/2023, CONSULTATION DATE: 04/09/2023 REFERRING MD: [facility], CHIEF COMPLAINT: Respiratory failure, new onset of seizures  History of Present Illness:  83 year old male full code with the extremely complex past medical history is well-documented below he was noted to have a new onset of seizures which resolved was in antiepileptics and lumbar puncture with results not back yet.  He was transferred to [facility].  He is on heparin  drip for his atrial fibrillation he also is on and off oxygen  by nasal cannula and BiPAP currently on 50% BiPAP with O2 sats of 98%.  Due to the complexity of his care he may need to move to the intensive care unit but currently does not meet the criteria.  Pulmonary critical care will continue to follow and transfer if needed.  Pertinent  Medical History   Past Medical History:  Diagnosis Date   Agent orange exposure    Anemia    Anxiety    Aortic atherosclerosis (HCC)    Aortic stenosis 07/04/2013   a.)TTE 07/04/13: mild (MPG 12; AVA 1.6); b.) TTE 01/01/16: mod (MPG 14; AVA 1.17); c.) TTE 08/30/17: mod (MPG 15; AVA 1.25); d.) TTE 07/13/19: mod (MPG 19; AVA 1.16); e.) TTE 07/23/20: mod (MPG 22; AVA 1.25); f.) TTE 03/12/2021: mod-sev (MPH 34; AVA 0.75); g.) TTE 05/21/2021: mod (MPG 22; AVA 0.8); h.) RHC 08/27/2020: sev (MPG 26.18; AVA 0.69); i.) s/p TAVR 11/25/21: 26 mm Edwards S3 Ultra Resilia   Arthritis 03/12/2011   Cardiomegaly    Cerebral microvascular disease    Cholelithiasis    Chronic hyponatremia    Chronic obstructive lung disease (HCC)    Colostomy in place [facility])    a.) s/p resection secondary to rectal cancer   Coronary artery disease 03/2013   a.) LHC 04/10/2013: 10% mLAD, 50% D1, 30% D2, 99% mLCx, 50% OM1, 40% pRCA, 30% mRCA, 60% RPDA, 40% RPLS - med mgmt; b.) MV 08/31/2019: basal inflat/mid inflat defect c/w ischemia in LCx distribution. EF 37%; c.) R/LHC  08/27/2020: 50% D1, 50% D2, 90% mLCx, 40% p-mRCA, 80% RPDA - med mgmt   Former smoker    HFrEF (heart failure with reduced ejection fraction) (HCC)    a.) TEE 04/13/13: EF 20-25, glob HK, G1DD, mild LAE, mod-sev MR; b.) TTE 08/30/17: EF 45-50, LVH, mild LAE, G1DD, mod AS, mod MR; c.) TTE 07/13/19: EF 30-35, glob HK, G1DD, RVSP 49.4, mild LAE, mild-mod MR, mod AS; d.) TTE 03/12/21: EF 55, G2DD, mild MR, mod-sev AS; e.) TTE 12/24/21: EF 50, mild LVH, G1DD, RVSP 40.5, mild-mod MR, triv AR; f.) TTE 11/18/22: EF 45-50, glob HK, G1DD, mild MR   History of bilateral cataract extraction    Hyperlipidemia    Hypertension    Hypokalemia    Ischemic cardiomyopathy    a.) TTE 04/08/2014: EF 25-30%; b.) TEE 04/13/2013: EF 20-25%; c.) TTE 07/04/2013: EF 30-35%; d.) TTE 01/01/2016: EF 40-45%; e.) TTE 08/30/2017: EF 45-50%; f.) TTE 07/03/2019: EF 30-35%; g.) MV 08/31/2019: EF 37%; h.) TTE 07/23/20: EF 55-60%; i.) TTE 03/12/2021: EF 55%; j.) TTE 05/21/2021: EF 40-45%; k.) TTE 11/26/2021: EF 45-50%; l.) TTE 12/24/2021: EF 50%; m.) TTE 11/18/2022: EF 45-50%   Long term (current) use of aspirin     Mass of upper lobe of left lung 03/10/2021   a.) CTA chest 03/10/2021: 6.1 mm; b.) CTA chest 10/15/2021: 8 mm; c.) PET CT 11/14/2021: 8 mm (SUV max  2.8); d.) CXR 10/05/2022: interval increase to 2.7 cm; e.) CXR 01/18/2023: interval increase to 3.7 cm; f.) CT chest 01/22/2023: 3.8 x 3.2 cm; g.) PET CT 02/01/2023: 3.8 cm and markedly hypermetabolic with SUV max of 43   Multifocal atrial tachycardia (HCC)    Myocardial infarction [facility])    a.) suspected by cardiology during 03/2013 admission for CP/CAP/new onset CHF --> TTE  showed severe LV dysfunction with segmental wall motion abnormalities and severe MR (posteriorly-directed  jet)   Pneumonia    Pre-diabetes    Pulmonary hypertension (HCC)    a.) TTE 04/08/2013: RVSP 62.2; b.) TTE 07/13/2019: RVSP 49.4; c.) TTE 07/23/2020: RVSP 33.2; d.) TTE 11/26/2021: RVSP 41.2; e.) TTE  12/24/2021: RVSP 40.5; f.) TTE 11/18/2019: RVSP 29.8   Rectal cancer (HCC)    Requires continuous at home supplemental oxygen     a.) 4L/[place]   S/P TAVR (transcatheter aortic valve replacement) 11/25/2021   a.) 26mm Edwards Sapien 3 Ultra Resilia via the TF approach   Statin intolerance    Umbilical hernia (s/p repair)       Significant Hospital Events: Including procedures, antibiotic start and stop dates in addition to other pertinent events     Interim History / Subjective:  Currently on BiPAP  Objective   Blood pressure 107/65, pulse 72, temperature 97.7 F (36.5 C), temperature source Oral, resp. rate 16, height 5' 6 (1.676 m), weight 82.7 kg, SpO2 95%.    FiO2 (%):  [40 %-60 %] 50 % PEEP:  [5 cmH20] 5 cmH20 Pressure Support:  [8 cmH20] 8 cmH20   Intake/Output Summary (Last 24 hours) at 04/09/2023 0929 Last data filed at 04/09/2023 0600 Gross per 24 hour  Intake 931.46 ml  Output 550 ml  Net 381.46 ml   Filed Weights   04/06/23 0430 04/08/23 0418 04/09/23 0448  Weight: 75.3 kg 67.6 kg 82.7 kg    Examination: General: Frail elderly male who is obviously confused HENT: Moderate JVD currently with a full facemask BiPAP Lungs: Coarse rhonchi bilaterally decreased breath sounds at the bases mild crackles Cardiovascular: Sounds are regular currently on a heparin  drip Abdomen: Abdomen with colostomy Extremities: Warm and dry without edema Neuro: Confused able to follow commands GU: Amber urine  Resolved Hospital Problem list     Assessment & Plan:  83 year old male with a plethora of health issues that are well-documented below who presents from outside hospital with new onset of seizures.  LP performed from outside hospital has no results yet.  He also has a history of stage II lung adenocarcinoma and has not started any therapy for this.  He also has a history of rectal cancer status post colostomy.  He is now requiring BiPAP and pulmonary critical care asked to  evaluate.  Suspected aspiration Continue noninvasive ventilator support with periods of nasal cannula as tolerated. He has stated he wants to be intubated if needed.  Currently does not need intubation or transfer to the intensive care unit Bronchodilators Mobilize as able Empirical antimicrobial therapy  New onset's of seizures negative head CT EEG Neuro consult Rule out meningitis  Status post TEVAR, hypertension and coronary artery disease with chronic systolic heart failure and with atrial fibrillation Heparin  drip Cardiology is following  We will continue to follow should he defervesce we will transfer to the intensive care unit for evaluation. Best Practice (right click and Reselect all SmartList Selections daily)   Diet/type: NPO DVT prophylaxis systemic heparin  Pressure ulcer(s): N/A GI prophylaxis: PPI Lines: N/A Foley:  N/A Code Status:  full code Last date of multidisciplinary goals of care discussion [tbd]  Labs   CBC: Recent Labs  Lab 04/05/23 1902 04/06/23 0448 04/07/23 0630 04/08/23 0921 04/09/23 0701  WBC 18.6* 18.1* 23.9* 18.4* 17.0*  HGB 8.8* 8.8* 8.8* 8.7* 8.6*  HCT 29.0* 27.7* 27.3* 26.2* 26.5*  MCV 86.3 86.0 84.0 82.9 83.3  PLT 574* 574* 586* 548* 576*    Basic Metabolic Panel: Recent Labs  Lab 04/05/23 1902 04/06/23 0448 04/07/23 0536 04/08/23 0715 04/09/23 0701  NA 131* 133* 133* 128* 129*  K 3.4* 3.3* 3.9 3.6 3.9  CL 93* 95* 90* 91* 93*  CO2 27 26 24 27 25   GLUCOSE 166* 114* 94 112* 88  BUN 14 15 18 12 12   CREATININE 0.77 0.75 0.74 0.61 0.71  CALCIUM 8.7* 8.7* 8.6* 8.4* 8.4*  MG 2.1 2.2 2.0  --   --   PHOS  --  3.3  --   --   --    GFR: Estimated Creatinine Clearance: 71.9 mL/min (by C-G formula based on SCr of 0.71 mg/dL). Recent Labs  Lab 04/06/23 0448 04/07/23 0536 04/07/23 0630 04/08/23 0921 04/09/23 0701  PROCALCITON  --  0.10  --   --   --   WBC 18.1*  --  23.9* 18.4* 17.0*    Liver Function Tests: Recent  Labs  Lab 04/05/23 1902  AST 13*  ALT 12  ALKPHOS 127*  BILITOT 0.6  PROT 7.5  ALBUMIN 2.6*   No results for input(s): LIPASE, AMYLASE in the last 168 hours. Recent Labs  Lab 04/05/23 2049 04/08/23 0715  AMMONIA 16 25    ABG    Component Value Date/Time   PHART 7.31 (L) 04/08/2023 2304   PCO2ART 57 (H) 04/08/2023 2304   PO2ART 111 (H) 04/08/2023 2304   HCO3 26.0 04/09/2023 0659   TCO2 29 11/25/2021 1317   O2SAT 94.9 04/09/2023 0659     Coagulation Profile: Recent Labs  Lab 04/05/23 1902  INR 1.2    Cardiac Enzymes: Recent Labs  Lab 04/05/23 2049  CKTOTAL 46*    HbA1C: Hgb A1c MFr Bld  Date/Time Value Ref Range Status  02/12/2020 02:54 PM 6.3 4.6 - 6.5 % Final    Comment:    Glycemic Control Guidelines for People with Diabetes:Non Diabetic:  <6%Goal of Therapy: <7%Additional Action Suggested:  >8%   01/31/2019 11:06 AM 5.7 4.6 - 6.5 % Final    Comment:    Glycemic Control Guidelines for People with Diabetes:Non Diabetic:  <6%Goal of Therapy: <7%Additional Action Suggested:  >8%     CBG: No results for input(s): GLUCAP in the last 168 hours.  Review of Systems:   na  Past Medical History:  He,  has a past medical history of Agent orange exposure, Anemia, Anxiety, Aortic atherosclerosis (HCC), Aortic stenosis (07/04/2013), Arthritis (03/12/2011), Cardiomegaly, Cerebral microvascular disease, Cholelithiasis, Chronic hyponatremia, Chronic obstructive lung disease (HCC), Colostomy in place [facility]), Coronary artery disease (03/2013), Former smoker, HFrEF (heart failure with reduced ejection fraction) (HCC), History of bilateral cataract extraction, Hyperlipidemia, Hypertension, Hypokalemia, Ischemic cardiomyopathy, Long term (current) use of aspirin , Mass of upper lobe of left lung (03/10/2021), Multifocal atrial tachycardia (HCC), Myocardial infarction (HCC), Pneumonia, Pre-diabetes, Pulmonary hypertension (HCC), Rectal cancer (HCC), Requires continuous at  home supplemental oxygen , S/P TAVR (transcatheter aortic valve replacement) (11/25/2021), Statin intolerance, and Umbilical hernia (s/p repair).   Surgical History:   Past Surgical History:  Procedure Laterality Date   APPENDECTOMY  06/29/2003  CATARACT EXTRACTION, BILATERAL Bilateral    COLOSTOMY     HERNIA REPAIR     INTRAOPERATIVE TRANSTHORACIC ECHOCARDIOGRAM N/A 11/25/2021   Procedure: INTRAOPERATIVE TRANSTHORACIC ECHOCARDIOGRAM;  Surgeon: Verlin Lonni BIRCH, MD;  Location[facility] OR;  Service: Open Heart Surgery;  Laterality: N/A;   IR LUMBAR PUNCTURE  04/08/2023   resection of rectum     RIGHT/LEFT HEART CATH AND CORONARY ANGIOGRAPHY Bilateral 04/10/2013   Procedure: RIGHT/LEFT HEART CATH AND CORONARY ANGIOGRAPHY; Location: [facility]; Surgeon: Deatrice Cage, MD   RIGHT/LEFT HEART CATH AND CORONARY ANGIOGRAPHY N/A 08/27/2021   Procedure: RIGHT/LEFT HEART CATH AND CORONARY ANGIOGRAPHY;  Surgeon: Wonda Sharper, MD;  Location: [facility] INVASIVE CV LAB;  Service: Cardiovascular;  Laterality: N/A;   TRANSCATHETER AORTIC VALVE REPLACEMENT, TRANSFEMORAL Left 11/25/2021   Procedure: Transcatheter Aortic Valve Replacement, Transfemoral using a 26 MM Edwards SAPIEN 3 Ultra.;  Surgeon: Verlin Lonni BIRCH, MD;  Location: [facility] OR;  Service: Open Heart Surgery;  Laterality: Left;   VIDEO BRONCHOSCOPY WITH ENDOBRONCHIAL ULTRASOUND N/A 03/03/2023   Procedure: VIDEO BRONCHOSCOPY WITH ENDOBRONCHIAL ULTRASOUND;  Surgeon: Parris Manna, MD;  Location: [facility] ORS;  Service: Thoracic;  Laterality: N/A;     Social History:   reports that he quit smoking about 10 years ago. His smoking use included cigarettes. He started smoking about 60 years ago. He has a 50 pack-year smoking history. He has never used smokeless tobacco. He reports that he does not drink alcohol  and does not use drugs.   Family History:  His family history includes Cancer - Cervical in his sister; Heart attack (age of onset: 39) in his brother;  Heart disease in his father, mother, sister, and sister; Hyperlipidemia in his father; Hypertension in his father.   Allergies Allergies  Allergen Reactions   Codeine Nausea Only   Statins Other (See Comments)    REACTION: myalgias     Home Medications  Prior to Admission medications   Medication Sig Start Date End Date Taking? Authorizing Provider  acetaminophen  (TYLENOL ) 500 MG tablet Take 500 mg by mouth at bedtime.   Yes [provider]  albuterol  (PROVENTIL ) (2.5 MG/3ML) 0.083% nebulizer solution Take 2.5 mg by nebulization at bedtime. 10/13/21  Yes [provider]  albuterol  (VENTOLIN  HFA) 108 (90 Base) MCG/ACT inhaler INHALE 2 PUFFS BY MOUTH EVERY 6 HOURS AS NEEDED FOR SHORTNESS OF BREATH Patient taking differently: 1-2 puffs every 6 (six) hours as needed. INHALE 2 PUFFS BY MOUTH EVERY 6 HOURS AS NEEDED FOR SHORTNESS OF BREATH 08/29/18  Yes Gretta Comer POUR, NP  Alirocumab  (PRALUENT ) 75 MG/ML SOAJ Inject 1 pen into the skin every 14 (fourteen) days. 08/25/18  Yes Cage Deatrice LABOR, MD  aspirin  81 MG tablet Take 81 mg by mouth at bedtime.   Yes [provider]  carvedilol  (COREG ) 6.25 MG tablet Take 3.125 mg by mouth 2 (two) times daily with a meal.   Yes [provider]  cholecalciferol  (VITAMIN D3) 25 MCG (1000 UNIT) tablet Take 1,000 Units by mouth daily.   Yes [provider]  dapagliflozin  propanediol (FARXIGA ) 10 MG TABS tablet TAKE ONE TABLET BY MOUTH EVERY MORNING BEFORE BREAKFAST 02/24/23  Yes Cage Deatrice LABOR, MD  diclofenac Sodium (VOLTAREN) 1 % GEL 2 g as needed. 04/08/21  Yes [provider]  fluticasone -salmeterol (ADVAIR) 250-50 MCG/ACT AEPB Inhale 1 puff into the lungs in the morning and at bedtime. 08/31/22  Yes [provider]  Magnesium  Oxide 400 (240 Mg) MG TABS TAKE 1 TABLET BY MOUTH ONCE DAILY.  Patient taking differently: Take 400 mg by mouth daily. 05/03/17  Yes Darron Deatrice LABOR, MD  OXYGEN  Inhale 4 L  into the lungs continuous.   Yes [provider]  sacubitril -valsartan  (ENTRESTO ) 49-51 MG Take half a tablet twice daily 02/23/23  Yes Darron Deatrice LABOR, MD  sodium chloride  1 g tablet Take 1 g by mouth daily with lunch. 1/2 tab 01/20/23 01/20/24 Yes [provider]  torsemide  (DEMADEX ) 20 MG tablet Take 20 mg by mouth daily.   Yes [provider]  traZODone  (DESYREL ) 50 MG tablet Take 1 tablet (50 mg total) by mouth at bedtime as needed. for sleep 08/29/18  Yes Gretta Comer POUR, NP     Critical care time: 45 min   Marcey Krrish Freund ACNP Acute Care Nurse Practitioner [facility]/Critical Care Please consult Amion 04/09/2023, 9:29 AM

## 2023-04-09 NOTE — Progress Notes (Signed)
 PHARMACY - ANTICOAGULATION CONSULT NOTE  Pharmacy Consult for IV heparin  Indication: atrial fibrillation  Allergies  Allergen Reactions   Codeine Nausea Only   Statins Other (See Comments)    REACTION: myalgias    Patient Measurements: Height: 5' 6 (167.6 cm) Weight: 82.7 kg (182 lb 5.1 oz) IBW/kg (Calculated) : 63.8 Heparin  Dosing Weight: 75.3 kg  Vital Signs: Temp: 97.7 F (36.5 C) (01/10 0851) Temp Source: Oral (01/10 0851) BP: 107/65 (01/10 0851) Pulse Rate: 72 (01/10 0851)  Labs: Recent Labs    04/07/23 0536 04/07/23 0603 04/07/23 0630 04/07/23 2206 04/08/23 0715 04/08/23 0921 04/08/23 2029 04/09/23 0701  HGB  --    < > 8.8*  --   --  8.7*  --  8.6*  HCT  --   --  27.3*  --   --  26.2*  --  26.5*  PLT  --   --  586*  --   --  548*  --  576*  HEPARINUNFRC  --    < >  --    < >  --  <0.10* 0.15* 0.18*  CREATININE 0.74  --   --   --  0.61  --   --  0.71   < > = values in this interval not displayed.    Estimated Creatinine Clearance: 71.9 mL/min (by C-G formula based on SCr of 0.71 mg/dL).   Medical History: Past Medical History:  Diagnosis Date   Agent orange exposure    Anemia    Anxiety    Aortic atherosclerosis (HCC)    Aortic stenosis 07/04/2013   a.)TTE 07/04/13: mild (MPG 12; AVA 1.6); b.) TTE 01/01/16: mod (MPG 14; AVA 1.17); c.) TTE 08/30/17: mod (MPG 15; AVA 1.25); d.) TTE 07/13/19: mod (MPG 19; AVA 1.16); e.) TTE 07/23/20: mod (MPG 22; AVA 1.25); f.) TTE 03/12/2021: mod-sev (MPH 34; AVA 0.75); g.) TTE 05/21/2021: mod (MPG 22; AVA 0.8); h.) RHC 08/27/2020: sev (MPG 26.18; AVA 0.69); i.) s/p TAVR 11/25/21: 26 mm Edwards S3 Ultra Resilia   Arthritis 03/12/2011   Cardiomegaly    Cerebral microvascular disease    Cholelithiasis    Chronic hyponatremia    Chronic obstructive lung disease (HCC)    Colostomy in place [facility])    a.) s/p resection secondary to rectal cancer   Coronary artery disease 03/2013   a.) LHC 04/10/2013: 10% mLAD, 50% D1, 30%  D2, 99% mLCx, 50% OM1, 40% pRCA, 30% mRCA, 60% RPDA, 40% RPLS - med mgmt; b.) MV 08/31/2019: basal inflat/mid inflat defect c/w ischemia in LCx distribution. EF 37%; c.) R/LHC 08/27/2020: 50% D1, 50% D2, 90% mLCx, 40% p-mRCA, 80% RPDA - med mgmt   Former smoker    HFrEF (heart failure with reduced ejection fraction) (HCC)    a.) TEE 04/13/13: EF 20-25, glob HK, G1DD, mild LAE, mod-sev MR; b.) TTE 08/30/17: EF 45-50, LVH, mild LAE, G1DD, mod AS, mod MR; c.) TTE 07/13/19: EF 30-35, glob HK, G1DD, RVSP 49.4, mild LAE, mild-mod MR, mod AS; d.) TTE 03/12/21: EF 55, G2DD, mild MR, mod-sev AS; e.) TTE 12/24/21: EF 50, mild LVH, G1DD, RVSP 40.5, mild-mod MR, triv AR; f.) TTE 11/18/22: EF 45-50, glob HK, G1DD, mild MR   History of bilateral cataract extraction    Hyperlipidemia    Hypertension    Hypokalemia    Ischemic cardiomyopathy    a.) TTE 04/08/2014: EF 25-30%; b.) TEE 04/13/2013: EF 20-25%; c.) TTE 07/04/2013: EF 30-35%; d.) TTE 01/01/2016: EF 40-45%; e.)  TTE 08/30/2017: EF 45-50%; f.) TTE 07/03/2019: EF 30-35%; g.) MV 08/31/2019: EF 37%; h.) TTE 07/23/20: EF 55-60%; i.) TTE 03/12/2021: EF 55%; j.) TTE 05/21/2021: EF 40-45%; k.) TTE 11/26/2021: EF 45-50%; l.) TTE 12/24/2021: EF 50%; m.) TTE 11/18/2022: EF 45-50%   Long term (current) use of aspirin     Mass of upper lobe of left lung 03/10/2021   a.) CTA chest 03/10/2021: 6.1 mm; b.) CTA chest 10/15/2021: 8 mm; c.) PET CT 11/14/2021: 8 mm (SUV max 2.8); d.) CXR 10/05/2022: interval increase to 2.7 cm; e.) CXR 01/18/2023: interval increase to 3.7 cm; f.) CT chest 01/22/2023: 3.8 x 3.2 cm; g.) PET CT 02/01/2023: 3.8 cm and markedly hypermetabolic with SUV max of 43   Multifocal atrial tachycardia (HCC)    Myocardial infarction (HCC)    a.) suspected by cardiology during 03/2013 admission for CP/CAP/new onset CHF --> TTE  showed severe LV dysfunction with segmental wall motion abnormalities and severe MR (posteriorly-directed  jet)   Pneumonia    Pre-diabetes     Pulmonary hypertension (HCC)    a.) TTE 04/08/2013: RVSP 62.2; b.) TTE 07/13/2019: RVSP 49.4; c.) TTE 07/23/2020: RVSP 33.2; d.) TTE 11/26/2021: RVSP 41.2; e.) TTE 12/24/2021: RVSP 40.5; f.) TTE 11/18/2019: RVSP 29.8   Rectal cancer (HCC)    Requires continuous at home supplemental oxygen     a.) 4L/[place]   S/P TAVR (transcatheter aortic valve replacement) 11/25/2021   a.) 26mm Edwards Sapien 3 Ultra Resilia via the TF approach   Statin intolerance    Umbilical hernia (s/p repair)    Assessment: Curtis Campos is a 83 y.o. year old male admitted on 04/05/2023 with concern for new afib RVR. No anticoagulation prior to admission. Pharmacy consulted to dose heparin .  Pt is s/p LP. Ok to resume heparin  per Dr. De Macedo.   Heparin  level is still subtherapeutic. Hgb ~8s, plt ok   Goal of Therapy:  Heparin  level 0.3-0.7 units/ml Monitor platelets by anticoagulation protocol: Yes   Plan:  Increase heparin  2000 units/hr Check 8 hour heparin  level Daily heparin  level, CBC, and monitoring for bleeding F/u plans for anticoagulation   Sergio Batch, PharmD, BCIDP, AAHIVP, CPP Infectious Disease Pharmacist 04/09/2023 9:51 AM

## 2023-04-09 NOTE — Progress Notes (Signed)
 PHARMACY - ANTICOAGULATION CONSULT NOTE  Pharmacy Consult for IV heparin  Indication: atrial fibrillation  Allergies  Allergen Reactions   Codeine Nausea Only   Statins Other (See Comments)    REACTION: myalgias    Patient Measurements: Height: 5' 6 (167.6 cm) Weight: 82.7 kg (182 lb 5.1 oz) IBW/kg (Calculated) : 63.8 Heparin  Dosing Weight: 75.3 kg  Vital Signs: Temp: 97.8 F (36.6 C) (01/10 1800) Temp Source: Axillary (01/10 1800) BP: 148/75 (01/10 1800) Pulse Rate: 84 (01/10 1800)  Labs: Recent Labs    04/07/23 0536 04/07/23 0603 04/07/23 0630 04/07/23 2206 04/08/23 0715 04/08/23 0921 04/08/23 2029 04/09/23 0701 04/09/23 1659  HGB  --    < > 8.8*  --   --  8.7*  --  8.6*  --   HCT  --   --  27.3*  --   --  26.2*  --  26.5*  --   PLT  --   --  586*  --   --  548*  --  576*  --   HEPARINUNFRC  --    < >  --    < >  --  <0.10* 0.15* 0.18* 0.33  CREATININE 0.74  --   --   --  0.61  --   --  0.71  --    < > = values in this interval not displayed.    Estimated Creatinine Clearance: 71.9 mL/min (by C-G formula based on SCr of 0.71 mg/dL).   Medical History: Past Medical History:  Diagnosis Date   Agent orange exposure    Anemia    Anxiety    Aortic atherosclerosis (HCC)    Aortic stenosis 07/04/2013   a.)TTE 07/04/13: mild (MPG 12; AVA 1.6); b.) TTE 01/01/16: mod (MPG 14; AVA 1.17); c.) TTE 08/30/17: mod (MPG 15; AVA 1.25); d.) TTE 07/13/19: mod (MPG 19; AVA 1.16); e.) TTE 07/23/20: mod (MPG 22; AVA 1.25); f.) TTE 03/12/2021: mod-sev (MPH 34; AVA 0.75); g.) TTE 05/21/2021: mod (MPG 22; AVA 0.8); h.) RHC 08/27/2020: sev (MPG 26.18; AVA 0.69); i.) s/p TAVR 11/25/21: 26 mm Edwards S3 Ultra Resilia   Arthritis 03/12/2011   Cardiomegaly    Cerebral microvascular disease    Cholelithiasis    Chronic hyponatremia    Chronic obstructive lung disease (HCC)    Colostomy in place [facility])    a.) s/p resection secondary to rectal cancer   Coronary artery disease 03/2013    a.) LHC 04/10/2013: 10% mLAD, 50% D1, 30% D2, 99% mLCx, 50% OM1, 40% pRCA, 30% mRCA, 60% RPDA, 40% RPLS - med mgmt; b.) MV 08/31/2019: basal inflat/mid inflat defect c/w ischemia in LCx distribution. EF 37%; c.) R/LHC 08/27/2020: 50% D1, 50% D2, 90% mLCx, 40% p-mRCA, 80% RPDA - med mgmt   Former smoker    HFrEF (heart failure with reduced ejection fraction) (HCC)    a.) TEE 04/13/13: EF 20-25, glob HK, G1DD, mild LAE, mod-sev MR; b.) TTE 08/30/17: EF 45-50, LVH, mild LAE, G1DD, mod AS, mod MR; c.) TTE 07/13/19: EF 30-35, glob HK, G1DD, RVSP 49.4, mild LAE, mild-mod MR, mod AS; d.) TTE 03/12/21: EF 55, G2DD, mild MR, mod-sev AS; e.) TTE 12/24/21: EF 50, mild LVH, G1DD, RVSP 40.5, mild-mod MR, triv AR; f.) TTE 11/18/22: EF 45-50, glob HK, G1DD, mild MR   History of bilateral cataract extraction    Hyperlipidemia    Hypertension    Hypokalemia    Ischemic cardiomyopathy    a.) TTE 04/08/2014: EF 25-30%; b.)  TEE 04/13/2013: EF 20-25%; c.) TTE 07/04/2013: EF 30-35%; d.) TTE 01/01/2016: EF 40-45%; e.) TTE 08/30/2017: EF 45-50%; f.) TTE 07/03/2019: EF 30-35%; g.) MV 08/31/2019: EF 37%; h.) TTE 07/23/20: EF 55-60%; i.) TTE 03/12/2021: EF 55%; j.) TTE 05/21/2021: EF 40-45%; k.) TTE 11/26/2021: EF 45-50%; l.) TTE 12/24/2021: EF 50%; m.) TTE 11/18/2022: EF 45-50%   Long term (current) use of aspirin     Mass of upper lobe of left lung 03/10/2021   a.) CTA chest 03/10/2021: 6.1 mm; b.) CTA chest 10/15/2021: 8 mm; c.) PET CT 11/14/2021: 8 mm (SUV max 2.8); d.) CXR 10/05/2022: interval increase to 2.7 cm; e.) CXR 01/18/2023: interval increase to 3.7 cm; f.) CT chest 01/22/2023: 3.8 x 3.2 cm; g.) PET CT 02/01/2023: 3.8 cm and markedly hypermetabolic with SUV max of 43   Multifocal atrial tachycardia (HCC)    Myocardial infarction (HCC)    a.) suspected by cardiology during 03/2013 admission for CP/CAP/new onset CHF --> TTE  showed severe LV dysfunction with segmental wall motion abnormalities and severe MR  (posteriorly-directed  jet)   Pneumonia    Pre-diabetes    Pulmonary hypertension (HCC)    a.) TTE 04/08/2013: RVSP 62.2; b.) TTE 07/13/2019: RVSP 49.4; c.) TTE 07/23/2020: RVSP 33.2; d.) TTE 11/26/2021: RVSP 41.2; e.) TTE 12/24/2021: RVSP 40.5; f.) TTE 11/18/2019: RVSP 29.8   Rectal cancer (HCC)    Requires continuous at home supplemental oxygen     a.) 4L/[place]   S/P TAVR (transcatheter aortic valve replacement) 11/25/2021   a.) 26mm Edwards Sapien 3 Ultra Resilia via the TF approach   Statin intolerance    Umbilical hernia (s/p repair)    Assessment: Curtis Campos is a 83 y.o. year old male admitted on 04/05/2023 with concern for new afib RVR. No anticoagulation prior to admission. Pharmacy consulted to dose heparin . Pt is s/p LP. Ok to resume heparin  per Dr. De Macedo.   Heparin  level therapeutic at 0.33 after rate increase to heparin  2000 units/hr this AM. No issues with heparin  infusion or bleeding per RN.    Goal of Therapy:  Heparin  level 0.3-0.7 units/ml Monitor platelets by anticoagulation protocol: Yes   Plan:  Continue heparin  2000 units/hr Check heparin  level again w/ AM labs  Daily heparin  level, CBC, and monitoring for bleeding F/u plans for anticoagulation   Prudence Dollar, PharmD, BCPS 04/09/2023 6:10 PM

## 2023-04-09 NOTE — Progress Notes (Addendum)
 OT Cancellation Note  Patient Details Name: Curtis Campos MRN: [id number] DOB: 23-Jun-1940   Cancelled Treatment:    Reason Eval/Treat Not Completed: Medical issues which prohibited therapy. Pt. Currently having an EEG.  Will check back as able.    CHRISTELLA Nest Lorraine-COTA/L 04/09/2023, 12:05 PM

## 2023-04-09 NOTE — Progress Notes (Signed)
 LTM maint complete - no skin breakdown seen  Serviced A1 C3 [facility] monitored, Event button test confirmed by [facility].

## 2023-04-09 NOTE — Procedures (Addendum)
 Patient Name: Curtis Campos  MRN: [id number]  Epilepsy Attending: Arlin MALVA Krebs  Referring Physician/Provider: Voncile Isles, MD  Duration: 04/08/2023 2041 to 04/09/2023 2041   Patient history: 83 year old male with stage II lung adenocarcinoma plans for radiation who presented with altered mental status and proceeded to have focal seizures which then generalized. EEG to evaluate for seizure    Level of alertness: Lethargic   AEDs during EEG study: LEV, VPA, LCM   Technical aspects: This EEG study was done with scalp electrodes positioned according to the 10-20 International system of electrode placement. Electrical activity was reviewed with band pass filter of 1-70Hz , sensitivity of 7 uV/mm, display speed of 28mm/sec with a 60Hz  notched filter applied as appropriate. EEG data were recorded continuously and digitally stored.  Video monitoring was available and reviewed as appropriate.   Description: EEG showed 3-5hz  theta-delta slowing with overriding 13 to 15 Hz beta activity. Generalized periodic discharges (GPD) with triphasic morphology and fluctuating frequency between 1 to 2.5 Hz were also noted.  Average 2 times every hour, EEG showed generalized periodic discharges with triphasic morphology and frequency of 2 to 3.5 Hz with overlying rhythmicity but without definite evolution. This EEG pattern lasted for about 1 minute each time.  No clinical signs were seen during this EEG pattern. Hyperventilation and photic stimulation were not performed.      ABNORMALITY -Periodic discharges with triphasic morphology, generalized - Continuous slow, generalized    IMPRESSION: This study showed generalized periodic discharges with triphasic morphology and fluctuating frequency of 1 to 3.5 Hz with overlying rhythmicity.  This EEG pattern was noted average 2 times every hour lasting for about 1 minute each time. This pattern is on the ictal-interictal continuum with increased risk of seizures.  Additionally there is moderate diffuse encephalopathy.  EEG appears to be improving compared to previous day.   Curtis Campos

## 2023-04-09 NOTE — Plan of Care (Signed)
  Problem: Coping: Goal: Level of anxiety will decrease Outcome: Progressing   Problem: Activity: Goal: Risk for activity intolerance will decrease Outcome: Not Progressing   Problem: Clinical Measurements: Goal: Respiratory complications will improve Outcome: Not Progressing Goal: Cardiovascular complication will be avoided Outcome: Progressing   Problem: Clinical Measurements: Goal: Cardiovascular complication will be avoided Outcome: Progressing

## 2023-04-09 NOTE — Progress Notes (Signed)
 LTM maint complete - no skin breakdown under: Fp1 Fp2 Serviced several leads  [facility] monitored, Event button test confirmed by [facility].

## 2023-04-09 NOTE — Progress Notes (Addendum)
 PROGRESS NOTE   Curtis Campos  FMW:[id number]    DOB: [birth year]    DOA: 04/05/2023  PCP: [name], MD   I have briefly reviewed patients previous medical records in [facility].  Chief Complaint  Patient presents with   Altered Mental Status    Brief Hospital Course:   83 y.o. male with hx of stage II lung adenocarcinoma currently planned for upcoming radiation therapy, remote history of rectal cancer status post colostomy, aortic stenosis status post bioprosthetic TAVR, coronary artery disease, chronic systolic heart failure, hypertension, hyperlipidemia, COPD, chronic hypoxic respiratory failure on 4 L O2 at baseline, HLD (with statin intolerance), paroxysmal atrial fibrillation and chronic hyponatremia, who was brought in from home initially to [facility] ED due to altered mental status followed by what appeared to be possible seizure-like episode.  Family noted that patient was confused, was not making sense, unable to answer questions and while transporting him to ED, noted that he stiffened up.  In the ED he had witnessed generalized tonic-clonic seizures aborted with Ativan  2 mg.  Evaluated by neurology for seizures and Cardiology for A-fib with RVR and transferred to [facility] for LTM EEG and further management.  Neurology adjusting AEDs, on LTM EEG, underwent fluoroscopic guided LP 1/9 to rule out carcinomatous leptomeningitis-lab work pending.  Infectious meningitis ruled out.  Course complicated by aspiration pneumonia, acute on chronic diastolic CHF, acute on chronic hypoxic and hypercapnic respiratory failure needing BiPAP.  PCCM consulted 1/10   Assessment & Plan:  Principal Problem:   Seizure (HCC) Active Problems:   Witnessed seizure-like activity (HCC)   Atrial fibrillation with RVR (HCC)   Persistent atrial fibrillation (HCC)   Altered mental status   History of transcatheter aortic valve replacement (TAVR)   Chronic heart failure with mildly reduced ejection fraction  (HFmrEF, 41-49%) (HCC)   Atherosclerosis of native coronary artery of native heart without angina pectoris   History of cardioversion   Current use of long term anticoagulation   New onset epilepsy  Presented with altered mental status, focal seizures followed by generalized seizures Neurology consulting. MRI brain without acute findings and specifically no evidence of metastatic disease. LTM EEG: Study showed evidence of epileptogenicity and cortical dysfunction arising from left hemisphere, maximal in frontotemporal region.  Additionally moderate diffuse encephalopathy.  No definitive seizures were seen. Given underlying stage II lung adenocarcinoma, neurology obtained LP under fluoroscopy guidance by IR 1/9, to rule out leptomeningeal carcinomatous spread (lab work pending).  Ruled out infectious meningitis.  As needed IV Ativan  for seizures Seizure precautions and patient needs to be advised regarding no driving and other limitations, at time of discharge. Neurology actively adjusting AEDs.  Keppra  switched to valproic  acid but since valproic  acid levels at 34, got a one-time loading dose of Keppra  and valproate dose increased.  Vimpat  was added.  Aspiration pneumonia Chest x-ray 1/9 reviewed, aspiration pneumonia.  Repeat chest x-ray 1/10 suggestive of pulmonary edema more than pneumonia.  I personally reviewed chest x-ray and agree with findings. Continue IV Unasyn .  Aspiration precautions.  Acute metabolic encephalopathy This may all be postictal state from recent seizures and therapy for same.  Rule out bacterial meningitis. Acute hypoxic and hypercarbic respiratory failure could contribute. Pro calcitonin negative.  UA negative. B12: 668.  TSH normal Avoid sedatives and opioids.  Delirium precautions. As per continued LTM EEG read, findings with increased risk of seizures.  Paroxysmal atrial fibrillation with RVR At [facility] ED, noted to be RVR with diffuse  ST segment  depressions. Cardiology consultation and follow-up appreciated. IV Cardizem  x 1, briefly on Cardizem  drip, now stopped, due to pulmonary issues.  Not on [facility] PTA, now on IV heparin .  Need to consider timing of switch to DOAC given tenuous overall medical condition. Reverted to NSR Increasing Toprol -XL to 50 mg daily.  Acute on chronic respiratory failure with hypoxia and hypercarbia On home oxygen  4 L/min via  at baseline Influenza, RSV and SARS coronavirus 2 by RT-PCR negative. Was weaned down from HFNC 9-10 L/min oxygen  to 4 L/min yesterday.  However overnight on 1/9, progressive hypoxia lethargy, started and remains on a BiPAP. ABG 1/9: pH 7.31, pCO2 57, pO2 111.  VBG 1/10: pH 7.41, pCO2 41, pO2 65. Acute respiratory failure due to aspiration pneumonia and now acute on chronic combined CHF, complicating acute metabolic encephalopathy. Chronic respiratory failure due to underlying advanced COPD, lung scarring and lung mass. Treat aspiration pneumonia as above.  Given a dose of IV Lasix .  Monitor closely. CCM consulted and if patient does not improve or worsens, may need transfer to ICU  Acute on chronic combined systolic and diastolic CHF TTE 1/75 with LVEF 45-50% and grade 1 diastolic dysfunction.  Repeat echo 1/9 with similar EF. On GDMT with Toprol -XL 50 Mg daily, Entresto  and torsemide  20 Mg daily.  Resume Farxiga  at discharge. Cardiology following. Follow-up chest x-ray 1/10 shows pulmonary edema >pneumonia.  Apart from some upper extremity edema, does not appear volume overloaded, no JVD. Held torsemide  for today and gave IV Lasix  40 mg x 1.  CAD No anginal symptoms. Currently on aspirin  81 Mg daily-Per cardiology to be discontinued once he started on DOAC. Resume Praluent  and Zetia  at discharge.   COPD No clinical bronchospasm. Continue Dulera  and as needed bronchodilator nebs.   Mixed hyperlipidemia Intolerant to statins continue outpatient Praluent     Hypokalemia Replaced.  Magnesium  normal.  Hyponatremia May be multifactorial, now may be somewhat volume overloaded.?  SIADH.   Also suspect some of this is chronic given that he is on salt tablets at home. Completed brief and gentle IV normal saline hydration. Serum sodium up to 129.  Getting a dose of IV Lasix .  Follow BMP in AM.   Stage II adenocarcinoma of lung Continue outpatient follow-up with oncology service.   Leukocytosis WBC had peaked to 23.9. Now treating for aspiration pneumonia.  Bacterial meningitis ruled out.  On IV Unasyn .  Improving.  Anemia of chronic disease Stable.  Severe aortic stenosis s/p TAVR 8/23 Outpatient follow-up with cardiology.  Body mass index is 29.43 kg/m.   DVT prophylaxis: SCDs Start: 04/06/23 0307     Code Status: Full Code:  Family Communication: Discussed in detail with patient's son via phone, updated care and answered all questions.  Discussed about his CODE STATUS.  For now they wish for him to be a full code and continue all care but they plan to discuss amongst themselves, review any paperwork they have may have at home and then get back to us . Disposition:  Status is: Inpatient Remains inpatient appropriate because: Remains critically ill, on IV antibiotics, LTM EEG, titrating AEDs.     Consultants:   Neurology Cardiology IR PCCM  Procedures:   LTM EEG Fluoroscopic guided LP by IR on 1/9. BiPAP  Antimicrobials:      Subjective:  Overnight events 1/9 noted.  Became more hypoxic and lethargic.  Started on BiPAP.  This morning, patient states that he does not feel good, feels like he is  going to die, reports dyspnea and hurting all over.  Unable to elicit resuscitation status due to ongoing some degree of confusion. Objective:   Vitals:   04/09/23 9366 04/09/23 0634 04/09/23 0635 04/09/23 0643  BP:      Pulse: 80 79 76 70  Resp: 17 15 16 15   Temp:    97.9 F (36.6 C)  TempSrc:      SpO2: (!) 83% (!) 82% (S)  (!) 80% 92%  Weight:      Height:        General exam: Elderly male, moderately built and nourished, ill looking, lying comfortably propped up in bed on BiPAP.  No increased work of breathing. Respiratory system: Clear to auscultation anteriorly.  Slightly diminished breath sounds in the right base.  No wheezing, rhonchi or crackles.  No increased work of breathing. Cardiovascular system: S1 & S2 heard, RRR.  No JVD, murmurs, rubs, gallops or clicks. No pedal edema.  Telemetry personally reviewed: Sinus rhythm.  Does have mild bilateral forearm edema with bruising. Gastrointestinal system: Abdomen is nondistended, soft and nontender. No organomegaly or masses felt. Normal bowel sounds heard. Central nervous system: Mentation is actually much better than the previous 2 days.  He is alert, oriented to self, place and answer simple questions as above appropriately. No focal neurological deficits. Extremities: Symmetric 5 x 5 power. Skin: No rashes, lesions or ulcers Psychiatry: Judgement and insight impaired but improved. Mood & affect cannot be assessed.     Data Reviewed:   I have personally reviewed following labs and imaging studies   CBC: Recent Labs  Lab 04/07/23 0630 04/08/23 0921 04/09/23 0701  WBC 23.9* 18.4* 17.0*  HGB 8.8* 8.7* 8.6*  HCT 27.3* 26.2* 26.5*  MCV 84.0 82.9 83.3  PLT 586* 548* 576*    Basic Metabolic Panel: Recent Labs  Lab 04/05/23 1902 04/06/23 0448 04/07/23 0536 04/08/23 0715 04/09/23 0701  NA 131* 133* 133* 128* 129*  K 3.4* 3.3* 3.9 3.6 3.9  CL 93* 95* 90* 91* 93*  CO2 27 26 24 27 25   GLUCOSE 166* 114* 94 112* 88  BUN 14 15 18 12 12   CREATININE 0.77 0.75 0.74 0.61 0.71  CALCIUM 8.7* 8.7* 8.6* 8.4* 8.4*  MG 2.1 2.2 2.0  --   --   PHOS  --  3.3  --   --   --     Liver Function Tests: Recent Labs  Lab 04/05/23 1902  AST 13*  ALT 12  ALKPHOS 127*  BILITOT 0.6  PROT 7.5  ALBUMIN 2.6*    CBG: No results for input(s): GLUCAP in  the last 168 hours.  Microbiology Studies:   Recent Results (from the past 240 hours)  Resp panel by RT-PCR (RSV, Flu A&B, Covid) Anterior Nasal Swab     Status: None   Collection Time: 04/05/23  7:51 PM   Specimen: Anterior Nasal Swab  Result Value Ref Range Status   SARS Coronavirus 2 by RT PCR NEGATIVE NEGATIVE Final    Comment: (NOTE) SARS-CoV-2 target nucleic acids are NOT DETECTED.  The SARS-CoV-2 RNA is generally detectable in upper respiratory specimens during the acute phase of infection. The lowest concentration of SARS-CoV-2 viral copies this assay can detect is 138 copies/mL. A negative result does not preclude SARS-Cov-2 infection and should not be used as the sole basis for treatment or other patient management decisions. A negative result may occur with  improper specimen collection/handling, submission of specimen other than nasopharyngeal swab,  presence of viral mutation(s) within the areas targeted by this assay, and inadequate number of viral copies(<138 copies/mL). A negative result must be combined with clinical observations, patient history, and epidemiological information. The expected result is Negative.  Fact Sheet for Patients:  bloggercourse.com  Fact Sheet for Healthcare Providers:  seriousbroker.it  This test is no t yet approved or cleared by the [place]  FDA and  has been authorized for detection and/or diagnosis of SARS-CoV-2 by FDA under an Emergency Use Authorization (EUA). This EUA will remain  in effect (meaning this test can be used) for the duration of the COVID-19 declaration under Section 564(b)(1) of the Act, 21 U.S.C.section 360bbb-3(b)(1), unless the authorization is terminated  or revoked sooner.       Influenza A by PCR NEGATIVE NEGATIVE Final   Influenza B by PCR NEGATIVE NEGATIVE Final    Comment: (NOTE) The Xpert Xpress SARS-CoV-2/FLU/RSV plus assay is intended as an  aid in the diagnosis of influenza from Nasopharyngeal swab specimens and should not be used as a sole basis for treatment. Nasal washings and aspirates are unacceptable for Xpert Xpress SARS-CoV-2/FLU/RSV testing.  Fact Sheet for Patients: bloggercourse.com  Fact Sheet for Healthcare Providers: seriousbroker.it  This test is not yet approved or cleared by the [place]  FDA and has been authorized for detection and/or diagnosis of SARS-CoV-2 by FDA under an Emergency Use Authorization (EUA). This EUA will remain in effect (meaning this test can be used) for the duration of the COVID-19 declaration under Section 564(b)(1) of the Act, 21 U.S.C. section 360bbb-3(b)(1), unless the authorization is terminated or revoked.     Resp Syncytial Virus by PCR NEGATIVE NEGATIVE Final    Comment: (NOTE) Fact Sheet for Patients: bloggercourse.com  Fact Sheet for Healthcare Providers: seriousbroker.it  This test is not yet approved or cleared by the [place]  FDA and has been authorized for detection and/or diagnosis of SARS-CoV-2 by FDA under an Emergency Use Authorization (EUA). This EUA will remain in effect (meaning this test can be used) for the duration of the COVID-19 declaration under Section 564(b)(1) of the Act, 21 U.S.C. section 360bbb-3(b)(1), unless the authorization is terminated or revoked.  Performed at [facility], [address], [place], [place] [address]   CSF culture w Gram Stain     Status: None (Preliminary result)   Collection Time: 04/07/23  1:25 PM   Specimen: CSF; Cerebrospinal Fluid  Result Value Ref Range Status   Specimen Description CSF  Final   Special Requests NONE  Final   Gram Stain   Final    WBC PRESENT,BOTH PMN AND MONONUCLEAR NO ORGANISMS SEEN CYTOSPIN SMEAR Performed at [facility], [address] [place], [place] [address]     Culture PENDING  Incomplete   Report Status PENDING  Incomplete    Radiology Studies:  DG CHEST PORT 1 VIEW Result Date: 04/09/2023 CLINICAL DATA:  83 year old male with respiratory failure and hypoxia. Known cancer. EXAM: PORTABLE CHEST 1 VIEW COMPARISON:  Portable chest yesterday and earlier. FINDINGS: Portable AP semi upright view at 0714 hours. Stable lung volumes and mediastinal contours. Prior TAVR. Calcified aortic atherosclerosis. Visualized tracheal air column is within normal limits. No pneumothorax. But progressive coarse pulmonary interstitial opacity compared to yesterday, basilar predominant. Left upper lobe lung mass approximately 5 cm redemonstrated (see chest CTA report 03/06/2023). Small pleural effusions not significantly changed. IMPRESSION: 1. Progressive pulmonary interstitial opacity since yesterday, pulmonary interstitial edema favored over atypical/viral infection. 2. Known left upper lobe  lung mass. Small pleural effusions not significantly changed. Electronically Signed   By: VEAR Hurst M.D.   On: 04/09/2023 07:38   DG CHEST PORT 1 VIEW Result Date: 04/08/2023 CLINICAL DATA:  Acute respiratory failure with hypoxia. EXAM: PORTABLE CHEST 1 VIEW COMPARISON:  04/06/2023. FINDINGS: There are new heterogeneous opacities overlying the right lower lung zone, concerning for pneumonia. There is also new small layering right pleural effusion. Redemonstration of approximately 3 cm sized irregular nodule overlying the left upper mid lung zone region, unchanged. There is left retrocardiac opacity, likely due to combination of left lung atelectasis and/or consolidation with small layering pleural effusion, slightly decreased since the prior study. Stable cardio-mediastinal silhouette. No acute osseous abnormalities. The soft tissues are within normal limits. IMPRESSION: *New heterogeneous opacities overlying the right lower lung zone, concerning for pneumonia. There is also new small layering right  pleural effusion. *Persistent left retrocardiac opacity and slightly reduced left pleural effusion. Electronically Signed   By: Ree Molt M.D.   On: 04/08/2023 14:48   IR LUMBAR PUNCTURE Result Date: 04/08/2023 CLINICAL DATA:  83 year old male with history of 1 cancer presenting with altered mental status and seizures of unclear etiology. Fluoroscopy guided lumbar puncture requested. EXAM: DIAGNOSTIC LUMBAR PUNCTURE UNDER FLUOROSCOPIC GUIDANCE COMPARISON:  None Available. FLUOROSCOPY: Radiation Exposure Index (as provided by the fluoroscopic device): Peak skin dose 213 mGy. PROCEDURE: Informed consent was obtained from the patient prior to the procedure, including potential complications of headache, allergy, and pain. The patient was placed in a left lateral decubitus in the angiography table. The lower back was prepped with Betadine and draped. 1% Lidocaine  was used for local anesthesia. Lumbar puncture was performed at the L2-3 level using a 20 gauge spinal needle. Clear CSF return was seen. An opening pressure of 7 cm water was recorded. 12 mL of CSF was obtained for laboratory studies. The patient tolerated the procedure well and there were no apparent complications. The procedure was performed under moderate sedation. A total of 0.5 mg of Versed  and 25 mcg of fentanyl  were administered intravenously for moderate conscious sedation monitored under my direct supervision. Total intraservice time of sedation was time and units of time 24 minutes. The patient's vital signs were monitored throughout the procedure and recorded in the patient's medical record by the nurse. IMPRESSION: Successful and uncomplicated fluoroscopy guided lumbar puncture at the L2-3 level. Electronically Signed   By: Katyucia  de Macedo Rodrigues M.D.   On: 04/08/2023 13:39   ECHOCARDIOGRAM LIMITED Result Date: 04/08/2023    ECHOCARDIOGRAM LIMITED REPORT   Patient Name:   DERRION TRITZ Date of Exam: 04/08/2023 Medical Rec #:   [id number]        Height:       66.0 in Accession #:    [id number]       Weight:       149.0 lb Date of Birth:  1940-04-05        BSA:          1.765 m Patient Age:    82 years         BP:           140/66 mmHg Patient Gender: M                HR:           66 bpm. Exam Location:  Inpatient Procedure: Limited Echo, Color Doppler and Cardiac Doppler Indications:    Afib I48.91  Eval TAVR, LVEF  History:        Patient has prior history of Echocardiogram examinations, most                 recent 11/18/2022.                 Aortic Valve: 26 mm Sapien prosthetic, stented (TAVR) valve is                 present in the aortic position.  Sonographer:    Tinnie Gosling RDCS Referring Phys: [id number] SUNIT TOLIA IMPRESSIONS  1. Limited echo for LVEF an TAVR evaluation  2. Left ventricular ejection fraction, by estimation, is 45 to 50%. Left ventricular ejection fraction by 2D MOD biplane is 45.1 %. The left ventricle has mildly decreased function. The left ventricle demonstrates global hypokinesis. There is mild left ventricular hypertrophy.  3. The mitral valve is abnormal. Mild mitral valve regurgitation.  4. Valve was not completely interrogated- however, there does not apperar to be an increased gradient. The aortic valve has been repaired/replaced. There is a 26 mm Sapien prosthetic (TAVR) valve present in the aortic position. Aortic valve area, by VTI  measures 2.15 cm. Aortic valve mean gradient measures 5.0 mmHg. Aortic valve Vmax measures 1.67 m/s. Comparison(s): No significant change from prior study. 11/18/2022: LVEF 45-50%, 26 mm TAVR with MG 6 mmHg. FINDINGS  Left Ventricle: Left ventricular ejection fraction, by estimation, is 45 to 50%. Left ventricular ejection fraction by 2D MOD biplane is 45.1 %. The left ventricle has mildly decreased function. The left ventricle demonstrates global hypokinesis. There is mild left ventricular hypertrophy. Mitral Valve: The mitral valve is abnormal. Mild to  moderate mitral annular calcification. Mild mitral valve regurgitation. Aortic Valve: Valve was not completely interrogated- however, there does not apperar to be an increased gradient. The aortic valve has been repaired/replaced. Aortic valve mean gradient measures 5.0 mmHg. Aortic valve peak gradient measures 11.2 mmHg. Aortic valve area, by VTI measures 2.15 cm. There is a 26 mm Sapien prosthetic, stented (TAVR) valve present in the aortic position. Aorta: The aortic root and ascending aorta are structurally normal, with no evidence of dilitation. LEFT VENTRICLE PLAX 2D                        Biplane EF (MOD) LVIDd:         5.30 cm         LV Biplane EF:   Left LVIDs:         4.10 cm                          ventricular LV PW:         1.20 cm                          ejection LV IVS:        1.10 cm                          fraction by LVOT diam:     2.30 cm                          2D MOD LV SV:         74  biplane is LV SV Index:   42                               45.1 %. LVOT Area:     4.15 cm  LV Volumes (MOD) LV vol d, MOD    144.3 ml A2C: LV vol d, MOD    139.9 ml A4C: LV vol s, MOD    80.9 ml A2C: LV vol s, MOD    69.9 ml A4C: LV SV MOD A2C:   63.4 ml LV SV MOD A4C:   139.9 ml LV SV MOD BP:    65.1 ml AORTIC VALVE AV Area (Vmax):    1.72 cm AV Area (Vmean):   1.82 cm AV Area (VTI):     2.15 cm AV Vmax:           167.00 cm/s AV Vmean:          107.000 cm/s AV VTI:            0.346 m AV Peak Grad:      11.2 mmHg AV Mean Grad:      5.0 mmHg LVOT Vmax:         69.20 cm/s LVOT Vmean:        46.900 cm/s LVOT VTI:          0.179 m LVOT/AV VTI ratio: 0.52  SHUNTS Systemic VTI:  0.18 m Systemic Diam: 2.30 cm Vinie Maxcy MD Electronically signed by Vinie Maxcy MD Signature Date/Time: 04/08/2023/11:51:38 AM    Final    Overnight EEG with video Result Date: 04/07/2023 Shelton Arlin KIDD, MD     04/08/2023 10:30 AM Patient Name: COLLEEN DONAHOE MRN: [id number] Epilepsy Attending:  Arlin KIDD Shelton Referring Physician/Provider: Voncile Isles, MD Duration: 04/06/2023 2041 to 04/07/2023 2041  Patient history: 83 year old male with stage II lung adenocarcinoma plans for radiation who presented with altered mental status and proceeded to have focal seizures which then generalized. EEG to evaluate for seizure  Level of alertness: Awake, asleep  AEDs during EEG study: LEV  Technical aspects: This EEG study was done with scalp electrodes positioned according to the 10-20 International system of electrode placement. Electrical activity was reviewed with band pass filter of 1-70Hz , sensitivity of 7 uV/mm, display speed of 33mm/sec with a 60Hz  notched filter applied as appropriate. EEG data were recorded continuously and digitally stored.  Video monitoring was available and reviewed as appropriate.  Description: No clear posterior dominant rhythm was seen. Sleep was characterized by sleep spindles (12 to 14 Hz), maximal frontocentral region. EEG showed 3-5hz  theta-delta slowing with overriding 13 to 15 Hz beta activity. Sharp waves were noted in the hemisphere, maximal left frontal temporal region which at times appeared periodic at 1 Hz.  Gradually the sharp waves evolved into generalized periodic discharges with triphasic morphology at 1 Hz.  After around 10 AM, patient was noted to have generalized periodic discharges (GPD) with triphasic morphology with fluctuating frequency.  Once every few hours, EEG showed Gpds at 2-2.5hz  with overlying rhythmicity and fast activity lasting 1 to 2 minutes without definite evolution.  This EEG pattern then became more frequent and was noted 3-6 times every hour.  No clinical signs were seen during this EEG pattern. Hyperventilation and photic stimulation were not performed.    ABNORMALITY - Sharp waves, left hemisphere, maximal left frontotemporal region -Periodic discharges with triphasic morphology, generalized - Continuous slow, generalized and lateralized left  hemisphere  IMPRESSION: This study initially showed evidence of epileptogenicity and cortical dysfunction arising from left hemisphere, maximal in frontotemporal region. Additionally there is moderate diffuse encephalopathy.  No definite seizures were seen during this time. Gradually, EEG worsened and showed generalized periodic discharges with triphasic morphology and fluctuating frequency of 1 to 2.5 Hz with overlying rhythmicity lasting 1 to 2 minutes.  This EEG pattern is on the ictal -interictal continuum with increased risk of seizures.  Priyanka O Yadav    Scheduled Meds:    aspirin   81 mg Oral QHS   Chlorhexidine  Gluconate Cloth  6 each Topical Daily   feeding supplement  237 mL Oral BID BM   folic acid   1 mg Oral Daily   furosemide   40 mg Intravenous Once   guaiFENesin   600 mg Oral BID   lacosamide   100 mg Oral BID   metoprolol  succinate  50 mg Oral Daily   mometasone -formoterol   2 puff Inhalation BID   sacubitril -valsartan   1 tablet Oral BID   sodium chloride  flush  3 mL Intravenous Q12H   sodium chloride   1 g Oral BID WC   thiamine  (VITAMIN B1) injection  100 mg Intravenous Daily   Or   thiamine   100 mg Oral Daily   [START ON 04/10/2023] torsemide   20 mg Oral Daily    Continuous Infusions:    ampicillin -sulbactam (UNASYN ) IV 3 g (04/09/23 0411)   heparin  1,800 Units/hr (04/09/23 0355)   valproate sodium  500 mg (04/09/23 0640)     LOS: 3 days   Patient is critically ill with progressive acute on chronic hypoxic and hypercapnic respiratory failure, decompensated CHF, requiring intensive level overall evaluation, management as noted above and CCM consultation and updating family Critical care time spent: 60 minutes.  Trenda Mar, MD,  FACP, [facility], [facility], [facility] [place]      To contact the attending provider between 7A-7P or the covering provider during after hours 7P-7A, please log into the web site [email or web address] and  access using universal [place] password for that web site. If you do not have the password, please call the hospital operator.  04/09/2023, 7:50 AM

## 2023-04-09 NOTE — Progress Notes (Signed)
 Addendum:  Met with patient's son (HCPOA) and daughter-in-law at bedside. During the time of evaluation this afternoon, patient much improved compared to earlier this morning.  Awake, alert and oriented to self, place and family.  Able to tell me that he lives at home with his spouse and was independent which was confirmed by family.  Denied dyspnea. Did not appear in any distress.  Had been weaned down from BiPAP to Ventimask.  Put out 200 mL of urine with IV Lasix .  Lungs clear to auscultation.  Updated family in detail with critical nature of his illness.  Also discussed CODE STATUS.  They were leaning towards DNR/DNI but continued medical management.  However son wish to discuss with his sister who is the next HCPOA prior to making a final decision.  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 If you do not have the password, please call the hospital operator.

## 2023-04-09 NOTE — Progress Notes (Signed)
 Subjective: NO clinical seizures. Continues to be confused. Son and daughter in law at bedside  ROS: Unable to obtain due to poor mental status  Examination  Vital signs in last 24 hours: Temp:  [97.4 F (36.3 C)-97.9 F (36.6 C)] 97.7 F (36.5 C) (01/10 1205) Pulse Rate:  [57-80] 72 (01/10 0851) Resp:  [13-17] 16 (01/10 0851) BP: (98-145)/(62-87) 145/69 (01/10 1205) SpO2:  [80 %-96 %] 95 % (01/10 0851) FiO2 (%):  [40 %-60 %] 55 % (01/10 1203) Weight:  [82.7 kg] 82.7 kg (01/10 0448)  General: lying in bed, NAD Neuro: awake, alert, follows commands, but not answering orientation questions, keeps saying  I am in hell, CN appear grossly intact, spontaneously moving all extremities in bed  Basic Metabolic Panel: Recent Labs  Lab 04/05/23 1902 04/06/23 0448 04/07/23 0536 04/08/23 0715 04/09/23 0701  NA 131* 133* 133* 128* 129*  K 3.4* 3.3* 3.9 3.6 3.9  CL 93* 95* 90* 91* 93*  CO2 27 26 24 27 25   GLUCOSE 166* 114* 94 112* 88  BUN 14 15 18 12 12   CREATININE 0.77 0.75 0.74 0.61 0.71  CALCIUM 8.7* 8.7* 8.6* 8.4* 8.4*  MG 2.1 2.2 2.0  --   --   PHOS  --  3.3  --   --   --     CBC: Recent Labs  Lab 04/05/23 1902 04/06/23 0448 04/07/23 0630 04/08/23 0921 04/09/23 0701  WBC 18.6* 18.1* 23.9* 18.4* 17.0*  HGB 8.8* 8.8* 8.8* 8.7* 8.6*  HCT 29.0* 27.7* 27.3* 26.2* 26.5*  MCV 86.3 86.0 84.0 82.9 83.3  PLT 574* 574* 586* 548* 576*   Coagulation Studies: No results for input(s): LABPROT, INR in the last 72 hours.  Imaging No new brain imaging  ASSESSMENT AND PLAN: 83 y.o. male with stage II lung adenocarcinoma plans for radiation who presented with altered mental status and proceeded to have focal seizures which then generalized. Etiology of new onset seizures is unclear.  He was started on Keppra  500 mg twice daily and continues EEG was obtained which demonstrated left frontotemporal sharp waves.  Workup with MRI [place] with and without contrast with noted chronic right  occipital susceptibility artifact, no clear abnormality noted in the left frontotemporal region specifically, no leptomeningeal enhancement or T2 flair hyperintensity or stroke.   Differential for his continued confusion and new onset seizures broad including potential meningitis, leptomeningeal spread of his cancer, paraneoplastic process, new onset seizure in the setting of possible dementia.  New onset seizures Encephalopathy -No clear etiology.  Differentials include neoplastic versus paraneoplastic disease -Encephalopathy likely secondary to frequent seizures versus delirium  Recommendations -Will start IVIG for 5 days empirically for paraneoplastic disease -Have also considered starting IV steroids but patient is very confused right now and at risk for steroid psychosis.  Therefore we will hold off for now.  However, will revisit if needed -Continue current antiseizure medications: Vimpat  100 mg twice daily, Depakote 500 mg every 8 hours -Continue video EEG to monitor for seizure recurrence -If any further seizures, can increase Depakote.  Please check Depakote level as well as ammonia  -Discussed plan with patient's son and daughter-in-law at bedside -Continue seizure precautions -As needed IV Ativan  for seizures -Discussed plan with Dr. Judeth via secure chat  I have spent a total of  40  minutes with the patient reviewing hospital notes,  test results, labs and examining the patient as well as establishing an assessment and plan that was discussed personally with the patient's  family at bedside.  > 50% of time was spent in direct patient care.    Arlin Krebs Epilepsy [facility] For questions after 5pm please refer to AMION to reach the Neurologist on call

## 2023-04-09 NOTE — Progress Notes (Signed)
 During morning rounds spoke to attending physician regarding the patient's course of events over the last 24 hours.  After his LP yesterday patient was restarted on anticoagulation when cleared by other disciplines of medicine.  He had a respiratory distress yesterday night requiring BiPAP support.  From a cardiovascular standpoint reviewed the telemetry which still illustrates sinus rhythm with rare ectopy.  Medications reviewed  Current Facility-Administered Medications:    Ampicillin -Sulbactam (UNASYN ) 3 g in sodium chloride  0.9 % 100 mL IVPB, 3 g, Intravenous, Q6H, Billy Rocky SAUNDERS, RPH, Last Rate: 200 mL/hr at 04/09/23 0826, 3 g at 04/09/23 9173   aspirin  chewable tablet 81 mg, 81 mg, Oral, QHS, Segars, Dorn, MD, 81 mg at 04/08/23 2149   budesonide  (PULMICORT ) nebulizer solution 0.5 mg, 0.5 mg, Nebulization, BID, Kasa, Kurian, MD, 0.5 mg at 04/09/23 1201   Chlorhexidine  Gluconate Cloth 2 % PADS 6 each, 6 each, Topical, Daily, Hongalgi, Anand D, MD, 6 each at 04/09/23 1216   feeding supplement (ENSURE ENLIVE / ENSURE PLUS) liquid 237 mL, 237 mL, Oral, BID BM, Hongalgi, Anand D, MD, 237 mL at 04/09/23 0959   folic acid  (FOLVITE ) tablet 1 mg, 1 mg, Oral, Daily, Khaliqdina, Salman, MD, 1 mg at 04/09/23 9185   guaiFENesin  (MUCINEX ) 12 hr tablet 600 mg, 600 mg, Oral, BID, Hongalgi, Anand D, MD, 600 mg at 04/09/23 9185   heparin  ADULT infusion 100 units/mL (25000 units/250mL), 2,000 Units/hr, Intravenous, Continuous, Pham, Minh Q, RPH-CPP, Last Rate: 20 mL/hr at 04/09/23 0847, 2,000 Units/hr at 04/09/23 0847   ipratropium-albuterol  (DUONEB) 0.5-2.5 (3) MG/3ML nebulizer solution 3 mL, 3 mL, Nebulization, Q4H, Kasa, Kurian, MD, 3 mL at 04/09/23 1201   lacosamide  (VIMPAT ) tablet 100 mg, 100 mg, Oral, BID, Bhagat, Srishti L, MD, 100 mg at 04/09/23 9040   LORazepam  (ATIVAN ) injection 2 mg, 2 mg, Intravenous, Q5 min PRN, Segars, Jonathan, MD   metoprolol  succinate (TOPROL -XL) 24 hr tablet 50 mg,  50 mg, Oral, Daily, Haley, Sheng L, PA-C, 50 mg at 04/09/23 9185   ondansetron  (ZOFRAN ) injection 4 mg, 4 mg, Intravenous, Q6H PRN, Segars, Jonathan, MD   sacubitril -valsartan  (ENTRESTO ) 24-26 mg per tablet, 1 tablet, Oral, BID, Lenon Elsie HERO, [facility], 1 tablet at 04/09/23 9185   sodium chloride  flush (NS) 0.9 % injection 3 mL, 3 mL, Intravenous, Q12H, Segars, Dorn, MD, 3 mL at 04/09/23 1000   sodium chloride  tablet 1 g, 1 g, Oral, BID WC, Hongalgi, Anand D, MD, 1 g at 04/09/23 9185   thiamine  (VITAMIN B1) injection 100 mg, 100 mg, Intravenous, Daily **OR** thiamine  (VITAMIN B1) tablet 100 mg, 100 mg, Oral, Daily, Khaliqdina, Salman, MD, 100 mg at 04/09/23 0814   [START ON 04/10/2023] torsemide  (DEMADEX ) tablet 20 mg, 20 mg, Oral, Daily, Hongalgi, Anand D, MD   traZODone  (DESYREL ) tablet 50 mg, 50 mg, Oral, QHS PRN, Hongalgi, Anand D, MD, 50 mg at 04/08/23 2149   valproate (DEPACON ) 500 mg in dextrose  5 % 50 mL IVPB, 500 mg, Intravenous, Q8H, Khaliqdina, Salman, MD, Last Rate: 55 mL/hr at 04/09/23 0640, 500 mg at 04/09/23 0640   Status post cardioversion 04/06/2023  Continue rate control strategy with metoprolol .  May uptitrate if needed. Will avoid amiodarone  long-term given his pulmonary status. Currently on IV heparin  drip, transition to oral anticoagulant closer to discharge when medically safe. Tentatively scheduled cardiology follow-up on 04/27/2023  Please reach out if any questions or concerns arise during this hospitalization for which cardiology can help assist.  No charge.   Aasiyah Auerbach  Michele HAS, [facility] [place]  [facility]  [address] [place], [place] [address] Pager: [phone] Office: [phone] 2:08 PM 04/09/23

## 2023-04-10 ENCOUNTER — Inpatient Hospital Stay (HOSPITAL_COMMUNITY): Payer: No Typology Code available for payment source

## 2023-04-10 DIAGNOSIS — G9341 Metabolic encephalopathy: Secondary | ICD-10-CM | POA: Diagnosis not present

## 2023-04-10 DIAGNOSIS — J9602 Acute respiratory failure with hypercapnia: Secondary | ICD-10-CM

## 2023-04-10 DIAGNOSIS — J9601 Acute respiratory failure with hypoxia: Secondary | ICD-10-CM

## 2023-04-10 DIAGNOSIS — R569 Unspecified convulsions: Secondary | ICD-10-CM | POA: Diagnosis not present

## 2023-04-10 LAB — BASIC METABOLIC PANEL
Anion gap: 9 (ref 5–15)
BUN: 26 mg/dL — ABNORMAL HIGH (ref 8–23)
CO2: 26 mmol/L (ref 22–32)
Calcium: 8 mg/dL — ABNORMAL LOW (ref 8.9–10.3)
Chloride: 94 mmol/L — ABNORMAL LOW (ref 98–111)
Creatinine, Ser: 0.91 mg/dL (ref 0.61–1.24)
GFR, Estimated: >60 mL/min (ref >60)
Glucose, Bld: 99 mg/dL (ref 70–99)
Potassium: 3.5 mmol/L (ref 3.5–5.1)
Sodium: 129 mmol/L — ABNORMAL LOW (ref 135–145)

## 2023-04-10 LAB — BLOOD GAS, ARTERIAL
Acid-Base Excess: 5 mmol/L — ABNORMAL HIGH (ref 0.0–2.0)
Bicarbonate: 29.9 mmol/L — ABNORMAL HIGH (ref 20.0–28.0)
Drawn by: 28338
O2 Saturation: 94.8 %
Patient temperature: 36.6
pCO2 arterial: 43 mm[Hg] (ref 32–48)
pH, Arterial: 7.45 (ref 7.35–7.45)
pO2, Arterial: 64 mm[Hg] — ABNORMAL LOW (ref 83–108)

## 2023-04-10 LAB — CBC
HCT: 25.2 % — ABNORMAL LOW (ref 39.0–52.0)
Hemoglobin: 8.3 g/dL — ABNORMAL LOW (ref 13.0–17.0)
MCH: 27.2 pg (ref 26.0–34.0)
MCHC: 32.9 g/dL (ref 30.0–36.0)
MCV: 82.6 fL (ref 80.0–100.0)
Platelets: 537 10*3/uL — ABNORMAL HIGH (ref 150–400)
RBC: 3.05 MIL/uL — ABNORMAL LOW (ref 4.22–5.81)
RDW: 15.2 % (ref 11.5–15.5)
WBC: 14.2 10*3/uL — ABNORMAL HIGH (ref 4.0–10.5)
nRBC: 0 % (ref 0.0–0.2)

## 2023-04-10 LAB — HEPARIN LEVEL (UNFRACTIONATED)
Heparin Unfractionated: 0.73 [IU]/mL — ABNORMAL HIGH (ref 0.30–0.70)
Heparin Unfractionated: >1.1 [IU]/mL — ABNORMAL HIGH (ref 0.30–0.70)

## 2023-04-10 LAB — PROCALCITONIN: Procalcitonin: 0.57 ng/mL

## 2023-04-10 MED ORDER — POTASSIUM CHLORIDE 20 MEQ PO PACK
40.0000 meq | PACK | Freq: Once | ORAL | Status: AC
  Administered 2023-04-10: 40 meq via ORAL
  Filled 2023-04-10: qty 2

## 2023-04-10 MED ORDER — VALPROATE SODIUM 100 MG/ML IV SOLN
1000.0000 mg | Freq: Once | INTRAVENOUS | Status: AC
  Administered 2023-04-10: 1000 mg via INTRAVENOUS
  Filled 2023-04-10: qty 10

## 2023-04-10 MED ORDER — HEPARIN (PORCINE) 25000 UT/250ML-% IV SOLN
1700.0000 [IU]/h | INTRAVENOUS | Status: DC
  Administered 2023-04-11: 1700 [IU]/h via INTRAVENOUS
  Filled 2023-04-10: qty 250

## 2023-04-10 MED ORDER — POTASSIUM CHLORIDE CRYS ER 20 MEQ PO TBCR
40.0000 meq | EXTENDED_RELEASE_TABLET | Freq: Once | ORAL | Status: DC
  Filled 2023-04-10: qty 2

## 2023-04-10 MED ORDER — FUROSEMIDE 10 MG/ML IJ SOLN
40.0000 mg | Freq: Once | INTRAMUSCULAR | Status: AC
  Administered 2023-04-10: 40 mg via INTRAVENOUS
  Filled 2023-04-10: qty 4

## 2023-04-10 NOTE — Progress Notes (Signed)
 PHARMACY - ANTICOAGULATION CONSULT NOTE  Pharmacy Consult for IV heparin  Indication: atrial fibrillation  Allergies  Allergen Reactions   Codeine Nausea Only   Statins Other (See Comments)    REACTION: myalgias    Patient Measurements: Height: 5' 6 (167.6 cm) Weight: 82.7 kg (182 lb 5.1 oz) IBW/kg (Calculated) : 63.8 Heparin  Dosing Weight: 75.3 kg  Vital Signs: Temp: 98 F (36.7 C) (01/11 1700) Temp Source: Axillary (01/11 1700) BP: 167/69 (01/11 1715) Pulse Rate: 62 (01/11 1715)  Labs: Recent Labs    04/07/23 2206 04/08/23 0715 04/08/23 0921 04/08/23 2029 04/09/23 0701 04/09/23 1659 04/10/23 0627 04/10/23 1652  HGB   < >  --  8.7*  --  8.6*  --  8.3*  --   HCT  --   --  26.2*  --  26.5*  --  25.2*  --   PLT  --   --  548*  --  576*  --  537*  --   HEPARINUNFRC  --   --  <0.10*   < > 0.18* 0.33 0.73* >1.10*  CREATININE  --  0.61  --   --  0.71  --  0.91  --    < > = values in this interval not displayed.    Estimated Creatinine Clearance: 63.2 mL/min (by C-G formula based on SCr of 0.91 mg/dL).   Medical History: Past Medical History:  Diagnosis Date   Agent orange exposure    Anemia    Anxiety    Aortic atherosclerosis (HCC)    Aortic stenosis 07/04/2013   a.)TTE 07/04/13: mild (MPG 12; AVA 1.6); b.) TTE 01/01/16: mod (MPG 14; AVA 1.17); c.) TTE 08/30/17: mod (MPG 15; AVA 1.25); d.) TTE 07/13/19: mod (MPG 19; AVA 1.16); e.) TTE 07/23/20: mod (MPG 22; AVA 1.25); f.) TTE 03/12/2021: mod-sev (MPH 34; AVA 0.75); g.) TTE 05/21/2021: mod (MPG 22; AVA 0.8); h.) RHC 08/27/2020: sev (MPG 26.18; AVA 0.69); i.) s/p TAVR 11/25/21: 26 mm Edwards S3 Ultra Resilia   Arthritis 03/12/2011   Cardiomegaly    Cerebral microvascular disease    Cholelithiasis    Chronic hyponatremia    Chronic obstructive lung disease (HCC)    Colostomy in place [facility])    a.) s/p resection secondary to rectal cancer   Coronary artery disease 03/2013   a.) LHC 04/10/2013: 10% mLAD, 50% D1,  30% D2, 99% mLCx, 50% OM1, 40% pRCA, 30% mRCA, 60% RPDA, 40% RPLS - med mgmt; b.) MV 08/31/2019: basal inflat/mid inflat defect c/w ischemia in LCx distribution. EF 37%; c.) R/LHC 08/27/2020: 50% D1, 50% D2, 90% mLCx, 40% p-mRCA, 80% RPDA - med mgmt   Former smoker    HFrEF (heart failure with reduced ejection fraction) (HCC)    a.) TEE 04/13/13: EF 20-25, glob HK, G1DD, mild LAE, mod-sev MR; b.) TTE 08/30/17: EF 45-50, LVH, mild LAE, G1DD, mod AS, mod MR; c.) TTE 07/13/19: EF 30-35, glob HK, G1DD, RVSP 49.4, mild LAE, mild-mod MR, mod AS; d.) TTE 03/12/21: EF 55, G2DD, mild MR, mod-sev AS; e.) TTE 12/24/21: EF 50, mild LVH, G1DD, RVSP 40.5, mild-mod MR, triv AR; f.) TTE 11/18/22: EF 45-50, glob HK, G1DD, mild MR   History of bilateral cataract extraction    Hyperlipidemia    Hypertension    Hypokalemia    Ischemic cardiomyopathy    a.) TTE 04/08/2014: EF 25-30%; b.) TEE 04/13/2013: EF 20-25%; c.) TTE 07/04/2013: EF 30-35%; d.) TTE 01/01/2016: EF 40-45%; e.) TTE 08/30/2017: EF 45-50%; f.)  TTE 07/03/2019: EF 30-35%; g.) MV 08/31/2019: EF 37%; h.) TTE 07/23/20: EF 55-60%; i.) TTE 03/12/2021: EF 55%; j.) TTE 05/21/2021: EF 40-45%; k.) TTE 11/26/2021: EF 45-50%; l.) TTE 12/24/2021: EF 50%; m.) TTE 11/18/2022: EF 45-50%   Long term (current) use of aspirin     Mass of upper lobe of left lung 03/10/2021   a.) CTA chest 03/10/2021: 6.1 mm; b.) CTA chest 10/15/2021: 8 mm; c.) PET CT 11/14/2021: 8 mm (SUV max 2.8); d.) CXR 10/05/2022: interval increase to 2.7 cm; e.) CXR 01/18/2023: interval increase to 3.7 cm; f.) CT chest 01/22/2023: 3.8 x 3.2 cm; g.) PET CT 02/01/2023: 3.8 cm and markedly hypermetabolic with SUV max of 43   Multifocal atrial tachycardia (HCC)    Myocardial infarction (HCC)    a.) suspected by cardiology during 03/2013 admission for CP/CAP/new onset CHF --> TTE  showed severe LV dysfunction with segmental wall motion abnormalities and severe MR (posteriorly-directed  jet)   Pneumonia     Pre-diabetes    Pulmonary hypertension (HCC)    a.) TTE 04/08/2013: RVSP 62.2; b.) TTE 07/13/2019: RVSP 49.4; c.) TTE 07/23/2020: RVSP 33.2; d.) TTE 11/26/2021: RVSP 41.2; e.) TTE 12/24/2021: RVSP 40.5; f.) TTE 11/18/2019: RVSP 29.8   Rectal cancer (HCC)    Requires continuous at home supplemental oxygen     a.) 4L/[place]   S/P TAVR (transcatheter aortic valve replacement) 11/25/2021   a.) 26mm Edwards Sapien 3 Ultra Resilia via the TF approach   Statin intolerance    Umbilical hernia (s/p repair)    Assessment: Curtis Campos is a 83 y.o. year old male admitted on 04/05/2023 with concern for new afib RVR. No anticoagulation prior to admission. Pharmacy consulted to dose heparin . Pt is s/p LP. Ok to resume heparin  per Dr. De Macedo.   Heparin  level continues to rise despite rate decrease.  Spoke with RN who stated heparin  is infusing on the left and lab was drawn on the right.  No bleeding noted.   Goal of Therapy:  Heparin  level 0.3-0.7 units/ml Monitor platelets by anticoagulation protocol: Yes   Plan:  Hold heparin  x 1 hour (1730-1830) Restart heparin  at 1700 units/hr Check 8 hour heparin  level Daily heparin  level, CBC, and monitoring for bleeding F/u plans for oral anticoagulation   [employer], Pharm.D., BCPS Clinical Pharmacist  **Pharmacist phone directory can be found on [email or web address] listed under [facility].  04/10/2023 5:48 PM

## 2023-04-10 NOTE — Progress Notes (Signed)
 Subjective: NO clinical seizures. Continues to be confused.   ROS: Unable to obtain due to poor mental status  Examination  Vital signs in last 24 hours: Temp:  [97.7 F (36.5 C)-98 F (36.7 C)] 97.9 F (36.6 C) (01/11 1101) Pulse Rate:  [72-94] 80 (01/11 1101) Resp:  [17-28] 17 (01/11 1101) BP: (114-166)/(59-85) 166/77 (01/11 1101) SpO2:  [88 %-92 %] 91 % (01/11 1101) FiO2 (%):  [55 %] 55 % (01/11 0823)  General: lying in bed, NAD Neurological exam He is awake alert very uncomfortable in bed Has an oxygen  mask which she wants removed His speech is mildly dysarthric He has no evidence of aphasia He has poor attention concentration Able to follow simple commands Cranial nerves: Pupils are equal round react light, extraocular movements appear unhindered, face appears grossly symmetric.  Not very participatory with the exam at all times but intermittently cooperative. Motor examination with no restriction or asymmetry of movement of limbs although did not cooperate to keep arms or legs up for 10 or 5 seconds. Sensation intact  Basic Metabolic Panel: Recent Labs  Lab 04/05/23 1902 04/06/23 0448 04/07/23 0536 04/08/23 0715 04/09/23 0701 04/10/23 0627  NA 131* 133* 133* 128* 129* 129*  K 3.4* 3.3* 3.9 3.6 3.9 3.5  CL 93* 95* 90* 91* 93* 94*  CO2 27 26 24 27 25 26   GLUCOSE 166* 114* 94 112* 88 99  BUN 14 15 18 12 12  26*  CREATININE 0.77 0.75 0.74 0.61 0.71 0.91  CALCIUM 8.7* 8.7* 8.6* 8.4* 8.4* 8.0*  MG 2.1 2.2 2.0  --   --   --   PHOS  --  3.3  --   --   --   --     CBC: Recent Labs  Lab 04/06/23 0448 04/07/23 0630 04/08/23 0921 04/09/23 0701 04/10/23 0627  WBC 18.1* 23.9* 18.4* 17.0* 14.2*  HGB 8.8* 8.8* 8.7* 8.6* 8.3*  HCT 27.7* 27.3* 26.2* 26.5* 25.2*  MCV 86.0 84.0 82.9 83.3 82.6  PLT 574* 586* 548* 576* 537*    Paraneoplastic antibodies and serum-negative Paraneoplastic antibodies and CSF-negative  Imaging No new brain imaging  Overnight EEG with  periodic discharges with triphasic morphology generalized and continuous generalized slowing-morphology of the triphasics is improved in frequency and appearance from before.  ASSESSMENT AND PLAN: 84 y.o. male with stage II lung adenocarcinoma plans for radiation who presented with altered mental status and proceeded to have focal seizures which then generalized. Etiology of new onset seizures is unclear.  He was started on Keppra  500 mg twice daily and continues EEG was obtained which demonstrated left frontotemporal sharp waves.  Workup with MRI [place] with and without contrast with noted chronic right occipital susceptibility artifact, no clear abnormality noted in the left frontotemporal region specifically, no leptomeningeal enhancement or T2 flair hyperintensity or stroke.  Presumptively started on IVIG for possible paraneoplastic process.  Paraneoplastic antibodies and serum and CSF are negative   New onset seizures Encephalopathy -No clear etiology.  Differentials include neoplastic versus paraneoplastic disease -Encephalopathy likely secondary to frequent seizures versus delirium  Recommendations -Continue IVIG for total of 5 days empirically for paraneoplastic disease -Avoiding steroids due to agitation -Continue current antiseizure medications: Vimpat  100 mg twice daily, Depakote 500 mg every 8 hours. Depakote still subtherapeutic - will give additional load to get therapeutic--additional 1000 mg IV x1. -Continue video EEG to monitor for seizure recurrence-if remains seizure-free clinically and electrographically for another 24 hours, then DC LTM. -Continue seizure precautions -As  needed IV Ativan  for seizures -Discussed plan with Dr. Judeth via secure chat   -- Eligio Lav, MD Neurologist [facility]

## 2023-04-10 NOTE — Progress Notes (Signed)
 PHARMACY - ANTICOAGULATION CONSULT NOTE  Pharmacy Consult for IV heparin  Indication: atrial fibrillation  Allergies  Allergen Reactions   Codeine Nausea Only   Statins Other (See Comments)    REACTION: myalgias    Patient Measurements: Height: 5' 6 (167.6 cm) Weight: 82.7 kg (182 lb 5.1 oz) IBW/kg (Calculated) : 63.8 Heparin  Dosing Weight: 75.3 kg  Vital Signs: Temp: 97.9 F (36.6 C) (01/11 0700) Temp Source: Axillary (01/11 0700) BP: 156/73 (01/11 0700) Pulse Rate: 72 (01/11 0700)  Labs: Recent Labs    04/07/23 2206 04/08/23 0715 04/08/23 0921 04/08/23 2029 04/09/23 0701 04/09/23 1659 04/10/23 0627  HGB   < >  --  8.7*  --  8.6*  --  8.3*  HCT  --   --  26.2*  --  26.5*  --  25.2*  PLT  --   --  548*  --  576*  --  537*  HEPARINUNFRC  --   --  <0.10*   < > 0.18* 0.33 0.73*  CREATININE  --  0.61  --   --  0.71  --  0.91   < > = values in this interval not displayed.    Estimated Creatinine Clearance: 63.2 mL/min (by C-G formula based on SCr of 0.91 mg/dL).   Medical History: Past Medical History:  Diagnosis Date   Agent orange exposure    Anemia    Anxiety    Aortic atherosclerosis (HCC)    Aortic stenosis 07/04/2013   a.)TTE 07/04/13: mild (MPG 12; AVA 1.6); b.) TTE 01/01/16: mod (MPG 14; AVA 1.17); c.) TTE 08/30/17: mod (MPG 15; AVA 1.25); d.) TTE 07/13/19: mod (MPG 19; AVA 1.16); e.) TTE 07/23/20: mod (MPG 22; AVA 1.25); f.) TTE 03/12/2021: mod-sev (MPH 34; AVA 0.75); g.) TTE 05/21/2021: mod (MPG 22; AVA 0.8); h.) RHC 08/27/2020: sev (MPG 26.18; AVA 0.69); i.) s/p TAVR 11/25/21: 26 mm Edwards S3 Ultra Resilia   Arthritis 03/12/2011   Cardiomegaly    Cerebral microvascular disease    Cholelithiasis    Chronic hyponatremia    Chronic obstructive lung disease (HCC)    Colostomy in place [facility])    a.) s/p resection secondary to rectal cancer   Coronary artery disease 03/2013   a.) LHC 04/10/2013: 10% mLAD, 50% D1, 30% D2, 99% mLCx, 50% OM1, 40% pRCA, 30%  mRCA, 60% RPDA, 40% RPLS - med mgmt; b.) MV 08/31/2019: basal inflat/mid inflat defect c/w ischemia in LCx distribution. EF 37%; c.) R/LHC 08/27/2020: 50% D1, 50% D2, 90% mLCx, 40% p-mRCA, 80% RPDA - med mgmt   Former smoker    HFrEF (heart failure with reduced ejection fraction) (HCC)    a.) TEE 04/13/13: EF 20-25, glob HK, G1DD, mild LAE, mod-sev MR; b.) TTE 08/30/17: EF 45-50, LVH, mild LAE, G1DD, mod AS, mod MR; c.) TTE 07/13/19: EF 30-35, glob HK, G1DD, RVSP 49.4, mild LAE, mild-mod MR, mod AS; d.) TTE 03/12/21: EF 55, G2DD, mild MR, mod-sev AS; e.) TTE 12/24/21: EF 50, mild LVH, G1DD, RVSP 40.5, mild-mod MR, triv AR; f.) TTE 11/18/22: EF 45-50, glob HK, G1DD, mild MR   History of bilateral cataract extraction    Hyperlipidemia    Hypertension    Hypokalemia    Ischemic cardiomyopathy    a.) TTE 04/08/2014: EF 25-30%; b.) TEE 04/13/2013: EF 20-25%; c.) TTE 07/04/2013: EF 30-35%; d.) TTE 01/01/2016: EF 40-45%; e.) TTE 08/30/2017: EF 45-50%; f.) TTE 07/03/2019: EF 30-35%; g.) MV 08/31/2019: EF 37%; h.) TTE 07/23/20: EF 55-60%; i.)  TTE 03/12/2021: EF 55%; j.) TTE 05/21/2021: EF 40-45%; k.) TTE 11/26/2021: EF 45-50%; l.) TTE 12/24/2021: EF 50%; m.) TTE 11/18/2022: EF 45-50%   Long term (current) use of aspirin     Mass of upper lobe of left lung 03/10/2021   a.) CTA chest 03/10/2021: 6.1 mm; b.) CTA chest 10/15/2021: 8 mm; c.) PET CT 11/14/2021: 8 mm (SUV max 2.8); d.) CXR 10/05/2022: interval increase to 2.7 cm; e.) CXR 01/18/2023: interval increase to 3.7 cm; f.) CT chest 01/22/2023: 3.8 x 3.2 cm; g.) PET CT 02/01/2023: 3.8 cm and markedly hypermetabolic with SUV max of 43   Multifocal atrial tachycardia (HCC)    Myocardial infarction (HCC)    a.) suspected by cardiology during 03/2013 admission for CP/CAP/new onset CHF --> TTE  showed severe LV dysfunction with segmental wall motion abnormalities and severe MR (posteriorly-directed  jet)   Pneumonia    Pre-diabetes    Pulmonary hypertension (HCC)     a.) TTE 04/08/2013: RVSP 62.2; b.) TTE 07/13/2019: RVSP 49.4; c.) TTE 07/23/2020: RVSP 33.2; d.) TTE 11/26/2021: RVSP 41.2; e.) TTE 12/24/2021: RVSP 40.5; f.) TTE 11/18/2019: RVSP 29.8   Rectal cancer (HCC)    Requires continuous at home supplemental oxygen     a.) 4L/[place]   S/P TAVR (transcatheter aortic valve replacement) 11/25/2021   a.) 26mm Edwards Sapien 3 Ultra Resilia via the TF approach   Statin intolerance    Umbilical hernia (s/p repair)    Assessment: Curtis Campos is a 83 y.o. year old male admitted on 04/05/2023 with concern for new afib RVR. No anticoagulation prior to admission. Pharmacy consulted to dose heparin . Pt is s/p LP. Ok to resume heparin  per Dr. De Macedo.   Heparin  level 0.73 is slightly supratherapeutic with heparin  running at 2000 units/hr. Hgb (8.3) and PLTs (537) are stable. At bedside, no signs of bleeding. Confirmed with RN that heparin  level was drawn from the arm opposite of the infusion. Will decrease rate to return to goal range.    Goal of Therapy:  Heparin  level 0.3-0.7 units/ml Monitor platelets by anticoagulation protocol: Yes   Plan:  Decrease heparin  to 1900 units/hr Check 8 hour heparin  level Daily heparin  level, CBC, and monitoring for bleeding F/u plans for anticoagulation   Nidia Schaffer, PharmD PGY1 Pharmacy Resident  Please check AMION for all [facility] Pharmacy phone numbers After 10:00 PM, call Main Pharmacy [phone] 04/10/2023 7:36 AM

## 2023-04-10 NOTE — Plan of Care (Signed)

## 2023-04-10 NOTE — Plan of Care (Signed)
  Problem: Education: Goal: Knowledge of General Education information will improve Description: Including pain rating scale, medication(s)/side effects and non-pharmacologic comfort measures Outcome: Progressing   Problem: Health Behavior/Discharge Planning: Goal: Ability to manage health-related needs will improve Outcome: Progressing   Problem: Clinical Measurements: Goal: Ability to maintain clinical measurements within normal limits will improve Outcome: Progressing Goal: Will remain free from infection Outcome: Progressing Goal: Diagnostic test results will improve Outcome: Progressing Goal: Respiratory complications will improve Outcome: Progressing Goal: Cardiovascular complication will be avoided Outcome: Progressing   Problem: Activity: Goal: Risk for activity intolerance will decrease Outcome: Progressing   Problem: Nutrition: Goal: Adequate nutrition will be maintained Outcome: Progressing   Problem: Coping: Goal: Level of anxiety will decrease Outcome: Progressing   Problem: Elimination: Goal: Will not experience complications related to bowel motility Outcome: Progressing Goal: Will not experience complications related to urinary retention Outcome: Progressing   Problem: Pain Management: Goal: General experience of comfort will improve Outcome: Progressing   Problem: Safety: Goal: Ability to remain free from injury will improve Outcome: Progressing   Problem: Skin Integrity: Goal: Risk for impaired skin integrity will decrease Outcome: Progressing   Problem: Education: Goal: Expressions of having a comfortable level of knowledge regarding the disease process will increase Outcome: Progressing   Problem: Coping: Goal: Ability to adjust to condition or change in health will improve Outcome: Progressing Goal: Ability to identify appropriate support needs will improve Outcome: Progressing   Problem: Health Behavior/Discharge Planning: Goal:  Compliance with prescribed medication regimen will improve Outcome: Progressing   Problem: Medication: Goal: Risk for medication side effects will decrease Outcome: Progressing   Problem: Clinical Measurements: Goal: Complications related to the disease process, condition or treatment will be avoided or minimized Outcome: Progressing Goal: Diagnostic test results will improve Outcome: Progressing   Problem: Safety: Goal: Verbalization of understanding the information provided will improve Outcome: Progressing   Problem: Self-Concept: Goal: Level of anxiety will decrease Outcome: Progressing Goal: Ability to verbalize feelings about condition will improve Outcome: Progressing

## 2023-04-10 NOTE — Progress Notes (Signed)
 PROGRESS NOTE   Curtis Campos  FMW:[id number]    DOB: [birth year]    DOA: 04/05/2023  PCP: [name], MD   I have briefly reviewed patients previous medical records in [facility].  Chief Complaint  Patient presents with   Altered Mental Status    Brief Hospital Course:   83 y.o. male with hx of stage II lung adenocarcinoma currently planned for upcoming radiation therapy, remote history of rectal cancer status post colostomy, aortic stenosis status post bioprosthetic TAVR, coronary artery disease, chronic systolic heart failure, hypertension, hyperlipidemia, COPD, chronic hypoxic respiratory failure on 4 L O2 at baseline, HLD (with statin intolerance), paroxysmal atrial fibrillation and chronic hyponatremia, who was brought in from home initially to [facility] ED due to altered mental status followed by what appeared to be possible seizure-like episode.  Family noted that patient was confused, was not making sense, unable to answer questions and while transporting him to ED, noted that he stiffened up.  In the ED he had witnessed generalized tonic-clonic seizures aborted with Ativan  2 mg.  Evaluated by neurology for seizures and Cardiology for A-fib with RVR and transferred to [facility] for LTM EEG and further management.  Neurology adjusting AEDs, on LTM EEG, underwent fluoroscopic guided LP 1/9 to rule out carcinomatous leptomeningitis-lab work pending.  Infectious meningitis ruled out.  Course complicated by aspiration pneumonia, acute on chronic diastolic CHF, acute on chronic hypoxic and hypercapnic respiratory failure needing BiPAP.  PCCM consulted 1/10   Assessment & Plan:  Principal Problem:   Seizure (HCC) Active Problems:   Witnessed seizure-like activity (HCC)   Atrial fibrillation with RVR (HCC)   Persistent atrial fibrillation (HCC)   Altered mental status   History of transcatheter aortic valve replacement (TAVR)   Chronic heart failure with mildly reduced ejection fraction  (HFmrEF, 41-49%) (HCC)   Atherosclerosis of native coronary artery of native heart without angina pectoris   History of cardioversion   Current use of long term anticoagulation   New onset epilepsy  Presented with altered mental status, focal seizures followed by generalized seizures Neurology consulting.  Differentials include neoplastic versus paraneoplastic disease. MRI brain without acute findings and specifically no evidence of metastatic disease. Has been on LTM EEG since admission to [facility].  EEG continued to be abnormal over several days.  Per LTM EEG report 1/11, EEG continues to be improving compared to previous day. Neurology actively managing AEDs.  Several adjustments have been made since admission.  Currently on Vimpat  100 Mg twice daily, valproate 500 Mg IV every 8 hours.  Getting a loading dose of IV valproate 1 g on 1/11 (valproate level low, 28). Given underlying stage II lung adenocarcinoma, underwent LP by IR.  Bacterial meningitis ruled out (CSF meningitis/encephalitis panel negative, CSF culture negative to date).  Empirically started antibiotics for meningitis. As needed IV Ativan  for seizures Seizure precautions and patient needs to be advised regarding no driving and other limitations, at time of discharge. Currently on IVIG x 5 days empirically for paraneoplastic disease.  CSF paraneoplastic autoantibody profile: Negative Remains quite confused and may be hallucinating.  Aspiration pneumonia Chest x-ray 1/9 reviewed, aspiration pneumonia.  Repeat chest x-ray 1/10 suggestive of pulmonary edema more than pneumonia.  I personally reviewed chest x-ray and agree with findings. Continue IV Unasyn , day 3/7.  Aspiration precautions. Procalcitonin 0.57 (was negative on admission)  Acute metabolic encephalopathy Likely multifactorial related to ongoing seizure-like activity, multiple AEDs, acute respiratory failure with hypoxia and hypercarbia, infectious etiology  such as  aspiration pneumonia and hospital delirium.  Ruled out bacterial meningitis. UA negative. B12: 668.  TSH normal Avoid sedatives and opioids.  Delirium precautions.  Paroxysmal atrial fibrillation with RVR At [facility] ED, noted to be RVR with diffuse ST segment depressions. Cardiology consultation and follow-up appreciated. IV Cardizem  x 1, briefly on Cardizem  drip, now stopped, due to pulmonary issues.  Not on AC PTA, now on IV heparin .  Need to consider timing of switch to DOAC given tenuous overall medical condition. Reverted and remains in NSR Continue Toprol -XL to 50 mg daily.  Acute on chronic respiratory failure with hypoxia and hypercarbia On home oxygen  4 L/min via [place] at baseline Influenza, RSV and SARS coronavirus 2 by RT-PCR negative. Was weaned down from HFNC 9-10 L/min oxygen  to 4 L/min yesterday.  However overnight on 1/9, progressive hypoxia lethargy, started and remains on a BiPAP. ABG 1/9: pH 7.31, pCO2 57, pO2 111.  VBG 1/10: pH 7.41, pCO2 41, pO2 65. Acute respiratory failure due to aspiration pneumonia and acute on chronic combined CHF, complicating acute metabolic encephalopathy. Chronic respiratory failure due to underlying advanced COPD, lung scarring and lung mass. Treat underlying etiology.  Monitor closely. CCM on board. Patient did not tolerate BiPAP and is on Ventimask 14 L/min.  ABG 1/11: pH 7.45, pCO2 43, pO2 64, HCO3 29.9.  Acute on chronic combined systolic and diastolic CHF TTE 1/75 with LVEF 45-50% and grade 1 diastolic dysfunction.  Repeat echo 1/9 with similar EF. On GDMT with Toprol -XL 50 Mg daily, Entresto  and torsemide  20 Mg daily.  Resume Farxiga  at discharge. Cardiology following. Follow-up chest x-ray 1/10 and 1/11 shows pulmonary edema >pneumonia.  Apart from some upper extremity edema, does not appear volume overloaded, no JVD. IV Lasix  40 mg x 1 on 1/10 and 1/11.  On torsemide  20 Mg daily.  CAD No anginal symptoms. Currently on aspirin  81 Mg  daily-Per cardiology to be discontinued once he started on DOAC. Resume Praluent  and Zetia  at discharge.   COPD No clinical bronchospasm. Continue Dulera  and as needed bronchodilator nebs.   Mixed hyperlipidemia Intolerant to statins continue outpatient Praluent    Hypokalemia Replaced.  Magnesium  normal.  Hyponatremia May be multifactorial, now may be somewhat volume overloaded.?  SIADH.   Also suspect some of this is chronic given that he is on salt tablets at home. Completed brief and gentle IV normal saline hydration. Serum sodium up to 129-129.  Getting a dose of IV Lasix .  Follow BMP in AM.   Stage II adenocarcinoma of lung Continue outpatient follow-up with oncology service.   Leukocytosis WBC had peaked to 23.9. Now treating for aspiration pneumonia.  Bacterial meningitis ruled out.  On IV Unasyn .  Improving, down to 14.2.  Anemia of chronic disease Stable.  Severe aortic stenosis s/p TAVR 8/23 Outpatient follow-up with cardiology.  Body mass index is 29.43 kg/m.   DVT prophylaxis: SCDs Start: 04/06/23 0307     Code Status: Full Code:  Family Communication: No family at bedside.  I had discussed extensively with patient's son/HCPOA and patient's daughter-in-law at bedside on 1/10, updated critical nature of his illness, advised them to consider regarding his resuscitation status.  They were leaning towards changing to DNR/DNI but continuing rest of medical management.  However they have not confirmed this yet. Disposition:  Status is: Inpatient Remains inpatient appropriate because: Remains critically ill, on IV antibiotics, LTM EEG, titrating AEDs.     Consultants:   Neurology Cardiology IR PCCM  Procedures:  LTM EEG Fluoroscopic guided LP by IR on 1/9. BiPAP  Antimicrobials:      Subjective:  Alert, very confused, unable to understand what he is.  Can hear him call out repeatedly halfway down the hall.  May be hallucinating.  Objective:    Vitals:   04/10/23 0700 04/10/23 0823 04/10/23 1101 04/10/23 1149  BP: (!) 156/73  (!) 166/77   Pulse: 72  80   Resp: 17  17   Temp: 97.9 F (36.6 C)  97.9 F (36.6 C)   TempSrc: Axillary  Axillary   SpO2: 91% 92% 91% 96%  Weight:      Height:        General exam: Elderly male, moderately built and nourished, ill looking, sitting up in bed with Ventimask on.  Not in painful distress or respiratory distress. Respiratory system: Clear to auscultation anteriorly.  Slightly diminished breath sounds in the bases.  Occasional crackles posteriorly.  No wheezing or rhonchi.  No tachypnea or increased work of breathing. Cardiovascular system: S1 & S2 heard, RRR.  No JVD, murmurs, rubs, gallops or clicks. No pedal edema.  Telemetry personally reviewed: Remains in sinus rhythm.  Does have mild bilateral forearm edema with bruising. Gastrointestinal system: Abdomen is nondistended, soft and nontender. No organomegaly or masses felt. Normal bowel sounds heard. Central nervous system: Mentation has clearly worsened compared to yesterday's eval.  Mentation appears to be waxing and waning.  Alert but not oriented, very confused, may be hallucinating, not following instructions. No focal neurological deficits. Extremities: Symmetric 5 x 5 power. Skin: No rashes, lesions or ulcers Psychiatry: Judgement and insight impaired. Mood & affect cannot be assessed.     Data Reviewed:   I have personally reviewed following labs and imaging studies   CBC: Recent Labs  Lab 04/08/23 0921 04/09/23 0701 04/10/23 0627  WBC 18.4* 17.0* 14.2*  HGB 8.7* 8.6* 8.3*  HCT 26.2* 26.5* 25.2*  MCV 82.9 83.3 82.6  PLT 548* 576* 537*    Basic Metabolic Panel: Recent Labs  Lab 04/05/23 1902 04/06/23 0448 04/07/23 0536 04/08/23 0715 04/09/23 0701 04/10/23 0627  NA 131* 133* 133* 128* 129* 129*  K 3.4* 3.3* 3.9 3.6 3.9 3.5  CL 93* 95* 90* 91* 93* 94*  CO2 27 26 24 27 25 26   GLUCOSE 166* 114* 94 112* 88 99   BUN 14 15 18 12 12  26*  CREATININE 0.77 0.75 0.74 0.61 0.71 0.91  CALCIUM 8.7* 8.7* 8.6* 8.4* 8.4* 8.0*  MG 2.1 2.2 2.0  --   --   --   PHOS  --  3.3  --   --   --   --     Liver Function Tests: Recent Labs  Lab 04/05/23 1902  AST 13*  ALT 12  ALKPHOS 127*  BILITOT 0.6  PROT 7.5  ALBUMIN 2.6*    CBG: No results for input(s): GLUCAP in the last 168 hours.  Microbiology Studies:   Recent Results (from the past 240 hours)  Resp panel by RT-PCR (RSV, Flu A&B, Covid) Anterior Nasal Swab     Status: None   Collection Time: 04/05/23  7:51 PM   Specimen: Anterior Nasal Swab  Result Value Ref Range Status   SARS Coronavirus 2 by RT PCR NEGATIVE NEGATIVE Final    Comment: (NOTE) SARS-CoV-2 target nucleic acids are NOT DETECTED.  The SARS-CoV-2 RNA is generally detectable in upper respiratory specimens during the acute phase of infection. The lowest concentration of SARS-CoV-2 viral copies  this assay can detect is 138 copies/mL. A negative result does not preclude SARS-Cov-2 infection and should not be used as the sole basis for treatment or other patient management decisions. A negative result may occur with  improper specimen collection/handling, submission of specimen other than nasopharyngeal swab, presence of viral mutation(s) within the areas targeted by this assay, and inadequate number of viral copies(<138 copies/mL). A negative result must be combined with clinical observations, patient history, and epidemiological information. The expected result is Negative.  Fact Sheet for Patients:  bloggercourse.com  Fact Sheet for Healthcare Providers:  seriousbroker.it  This test is no t yet approved or cleared by the [place]  FDA and  has been authorized for detection and/or diagnosis of SARS-CoV-2 by FDA under an Emergency Use Authorization (EUA). This EUA will remain  in effect (meaning this test can be used) for  the duration of the COVID-19 declaration under Section 564(b)(1) of the Act, 21 U.S.C.section 360bbb-3(b)(1), unless the authorization is terminated  or revoked sooner.       Influenza A by PCR NEGATIVE NEGATIVE Final   Influenza B by PCR NEGATIVE NEGATIVE Final    Comment: (NOTE) The Xpert Xpress SARS-CoV-2/FLU/RSV plus assay is intended as an aid in the diagnosis of influenza from Nasopharyngeal swab specimens and should not be used as a sole basis for treatment. Nasal washings and aspirates are unacceptable for Xpert Xpress SARS-CoV-2/FLU/RSV testing.  Fact Sheet for Patients: bloggercourse.com  Fact Sheet for Healthcare Providers: seriousbroker.it  This test is not yet approved or cleared by the [place]  FDA and has been authorized for detection and/or diagnosis of SARS-CoV-2 by FDA under an Emergency Use Authorization (EUA). This EUA will remain in effect (meaning this test can be used) for the duration of the COVID-19 declaration under Section 564(b)(1) of the Act, 21 U.S.C. section 360bbb-3(b)(1), unless the authorization is terminated or revoked.     Resp Syncytial Virus by PCR NEGATIVE NEGATIVE Final    Comment: (NOTE) Fact Sheet for Patients: bloggercourse.com  Fact Sheet for Healthcare Providers: seriousbroker.it  This test is not yet approved or cleared by the [place]  FDA and has been authorized for detection and/or diagnosis of SARS-CoV-2 by FDA under an Emergency Use Authorization (EUA). This EUA will remain in effect (meaning this test can be used) for the duration of the COVID-19 declaration under Section 564(b)(1) of the Act, 21 U.S.C. section 360bbb-3(b)(1), unless the authorization is terminated or revoked.  Performed at [facility], [address] [place], [place] [address]   CSF culture w Gram Stain     Status: None (Preliminary result)    Collection Time: 04/07/23  1:25 PM   Specimen: CSF; Cerebrospinal Fluid  Result Value Ref Range Status   Specimen Description CSF  Final   Special Requests NONE  Final   Gram Stain   Final    WBC PRESENT,BOTH PMN AND MONONUCLEAR NO ORGANISMS SEEN CYTOSPIN SMEAR    Culture   Final    NO GROWTH 2 DAYS Performed at [facility], [address] [place], [place] [address]    Report Status PENDING  Incomplete    Radiology Studies:  DG CHEST PORT 1 VIEW Result Date: 04/10/2023 CLINICAL DATA:  [id number] Acute on chronic respiratory failure with hypoxia [facility]) [id number] EXAM: PORTABLE CHEST 1 VIEW COMPARISON:  04/09/2023 FINDINGS: Left upper lobe mass again noted, unchanged. Diffuse interstitial prominence again noted, most pronounced in the lower lungs. Layering bilateral effusions with bibasilar airspace opacities. Cardiomediastinal contours  are stable. No acute bony abnormality. IMPRESSION: Stable interstitial prominence with lower lobe predominance, favor edema. Layering bilateral effusions. Stable left upper lobe mass. Electronically Signed   By: Franky Crease M.D.   On: 04/10/2023 08:40   DG CHEST PORT 1 VIEW Result Date: 04/09/2023 CLINICAL DATA:  83 year old male with respiratory failure and hypoxia. Known cancer. EXAM: PORTABLE CHEST 1 VIEW COMPARISON:  Portable chest yesterday and earlier. FINDINGS: Portable AP semi upright view at 0714 hours. Stable lung volumes and mediastinal contours. Prior TAVR. Calcified aortic atherosclerosis. Visualized tracheal air column is within normal limits. No pneumothorax. But progressive coarse pulmonary interstitial opacity compared to yesterday, basilar predominant. Left upper lobe lung mass approximately 5 cm redemonstrated (see chest CTA report 03/06/2023). Small pleural effusions not significantly changed. IMPRESSION: 1. Progressive pulmonary interstitial opacity since yesterday, pulmonary interstitial edema favored over atypical/viral infection. 2.  Known left upper lobe lung mass. Small pleural effusions not significantly changed. Electronically Signed   By: VEAR Hurst M.D.   On: 04/09/2023 07:38    Scheduled Meds:    aspirin   81 mg Oral QHS   budesonide  (PULMICORT ) nebulizer solution  0.5 mg Nebulization BID   Chlorhexidine  Gluconate Cloth  6 each Topical Daily   feeding supplement  237 mL Oral BID BM   folic acid   1 mg Oral Daily   guaiFENesin   600 mg Oral BID   ipratropium-albuterol   3 mL Nebulization Q4H   lacosamide   100 mg Oral BID   metoprolol  succinate  50 mg Oral Daily   sacubitril -valsartan   1 tablet Oral BID   sodium chloride  flush  3 mL Intravenous Q12H   sodium chloride   1 g Oral BID WC   thiamine  (VITAMIN B1) injection  100 mg Intravenous Daily   Or   thiamine   100 mg Oral Daily   torsemide   20 mg Oral Daily    Continuous Infusions:    ampicillin -sulbactam (UNASYN ) IV 3 g (04/10/23 1211)   heparin  1,900 Units/hr (04/10/23 1027)   Immune Globulin  10% 99.24 mL/hr at 04/09/23 2000   valproate sodium      valproate sodium  500 mg (04/10/23 0838)     LOS: 4 days   Patient is critically ill with progressive acute on chronic hypoxic and hypercapnic respiratory failure, decompensated CHF, requiring intensive level overall evaluation, management as noted above and CCM consultation and updating family Critical care time spent: 60 minutes.  Trenda Mar, MD,  FACP, [facility], [facility], [facility]ist & Physician Advisor [place]      To contact the attending provider between 7A-7P or the covering provider during after hours 7P-7A, please log into the web site [email or web address] and access using universal [place] password for that web site. If you do not have the password, please call the hospital operator.  04/10/2023, 3:22 PM

## 2023-04-10 NOTE — Progress Notes (Signed)
 LTM maint complete - no skin breakdown under:  P3, Fp1, Fp2.

## 2023-04-10 NOTE — Consult Note (Signed)
 NAME:  Curtis Campos, MRN:  [id number], DOB:  28-Feb-1941, LOS: 4 ADMISSION DATE:  04/05/2023, CONSULTATION DATE: 04/09/2023 REFERRING MD: Triad, CHIEF COMPLAINT: Respiratory failure, new onset of seizures  History of Present Illness:  83 year old male full code with the extremely complex past medical history is well-documented below he was noted to have a new onset of seizures which resolved was in antiepileptics and lumbar puncture with results not back yet.  He was transferred to [facility].  He is on heparin  drip for his atrial fibrillation he also is on and off oxygen  by nasal cannula and BiPAP currently on 50% BiPAP with O2 sats of 98%.  Due to the complexity of his care he may need to move to the intensive care unit but currently does not meet the criteria.  Pulmonary critical care will continue to follow and transfer if needed.  Pertinent  Medical History   Past Medical History:  Diagnosis Date   Agent orange exposure    Anemia    Anxiety    Aortic atherosclerosis (HCC)    Aortic stenosis 07/04/2013   a.)TTE 07/04/13: mild (MPG 12; AVA 1.6); b.) TTE 01/01/16: mod (MPG 14; AVA 1.17); c.) TTE 08/30/17: mod (MPG 15; AVA 1.25); d.) TTE 07/13/19: mod (MPG 19; AVA 1.16); e.) TTE 07/23/20: mod (MPG 22; AVA 1.25); f.) TTE 03/12/2021: mod-sev (MPH 34; AVA 0.75); g.) TTE 05/21/2021: mod (MPG 22; AVA 0.8); h.) RHC 08/27/2020: sev (MPG 26.18; AVA 0.69); i.) s/p TAVR 11/25/21: 26 mm Edwards S3 Ultra Resilia   Arthritis 03/12/2011   Cardiomegaly    Cerebral microvascular disease    Cholelithiasis    Chronic hyponatremia    Chronic obstructive lung disease (HCC)    Colostomy in place [facility])    a.) s/p resection secondary to rectal cancer   Coronary artery disease 03/2013   a.) LHC 04/10/2013: 10% mLAD, 50% D1, 30% D2, 99% mLCx, 50% OM1, 40% pRCA, 30% mRCA, 60% RPDA, 40% RPLS - med mgmt; b.) MV 08/31/2019: basal inflat/mid inflat defect c/w ischemia in LCx distribution. EF 37%; c.) R/LHC  08/27/2020: 50% D1, 50% D2, 90% mLCx, 40% p-mRCA, 80% RPDA - med mgmt   Former smoker    HFrEF (heart failure with reduced ejection fraction) (HCC)    a.) TEE 04/13/13: EF 20-25, glob HK, G1DD, mild LAE, mod-sev MR; b.) TTE 08/30/17: EF 45-50, LVH, mild LAE, G1DD, mod AS, mod MR; c.) TTE 07/13/19: EF 30-35, glob HK, G1DD, RVSP 49.4, mild LAE, mild-mod MR, mod AS; d.) TTE 03/12/21: EF 55, G2DD, mild MR, mod-sev AS; e.) TTE 12/24/21: EF 50, mild LVH, G1DD, RVSP 40.5, mild-mod MR, triv AR; f.) TTE 11/18/22: EF 45-50, glob HK, G1DD, mild MR   History of bilateral cataract extraction    Hyperlipidemia    Hypertension    Hypokalemia    Ischemic cardiomyopathy    a.) TTE 04/08/2014: EF 25-30%; b.) TEE 04/13/2013: EF 20-25%; c.) TTE 07/04/2013: EF 30-35%; d.) TTE 01/01/2016: EF 40-45%; e.) TTE 08/30/2017: EF 45-50%; f.) TTE 07/03/2019: EF 30-35%; g.) MV 08/31/2019: EF 37%; h.) TTE 07/23/20: EF 55-60%; i.) TTE 03/12/2021: EF 55%; j.) TTE 05/21/2021: EF 40-45%; k.) TTE 11/26/2021: EF 45-50%; l.) TTE 12/24/2021: EF 50%; m.) TTE 11/18/2022: EF 45-50%   Long term (current) use of aspirin     Mass of upper lobe of left lung 03/10/2021   a.) CTA chest 03/10/2021: 6.1 mm; b.) CTA chest 10/15/2021: 8 mm; c.) PET CT 11/14/2021: 8 mm (SUV max  2.8); d.) CXR 10/05/2022: interval increase to 2.7 cm; e.) CXR 01/18/2023: interval increase to 3.7 cm; f.) CT chest 01/22/2023: 3.8 x 3.2 cm; g.) PET CT 02/01/2023: 3.8 cm and markedly hypermetabolic with SUV max of 43   Multifocal atrial tachycardia (HCC)    Myocardial infarction [facility])    a.) suspected by cardiology during 03/2013 admission for CP/CAP/new onset CHF --> TTE  showed severe LV dysfunction with segmental wall motion abnormalities and severe MR (posteriorly-directed  jet)   Pneumonia    Pre-diabetes    Pulmonary hypertension (HCC)    a.) TTE 04/08/2013: RVSP 62.2; b.) TTE 07/13/2019: RVSP 49.4; c.) TTE 07/23/2020: RVSP 33.2; d.) TTE 11/26/2021: RVSP 41.2; e.) TTE  12/24/2021: RVSP 40.5; f.) TTE 11/18/2019: RVSP 29.8   Rectal cancer (HCC)    Requires continuous at home supplemental oxygen     a.) 4L/[place]   S/P TAVR (transcatheter aortic valve replacement) 11/25/2021   a.) 26mm Edwards Sapien 3 Ultra Resilia via the TF approach   Statin intolerance    Umbilical hernia (s/p repair)     Significant Hospital Events: Including procedures, antibiotic start and stop dates in addition to other pertinent events   1/6 admit 1/10 PCCM consult for respiratory distress, started on BiPAP  Interim History / Subjective:   Did not tolerate BiPAP overnight.  Now on Venturi mask and appears more comfortable and awake  Objective   Blood pressure (!) 166/77, pulse 80, temperature 97.9 F (36.6 C), temperature source Axillary, resp. rate 17, height 5' 6 (1.676 m), weight 82.7 kg, SpO2 91%.    FiO2 (%):  [55 %] 55 %   Intake/Output Summary (Last 24 hours) at 04/10/2023 1106 Last data filed at 04/10/2023 0600 Gross per 24 hour  Intake 1078.89 ml  Output 750 ml  Net 328.89 ml   Filed Weights   04/06/23 0430 04/08/23 0418 04/09/23 0448  Weight: 75.3 kg 67.6 kg 82.7 kg    Examination: Gen:      No acute distress elderly man HEENT:  EOMI, sclera anicteric Neck:     No masses; no thyromegaly Lungs:    Clear to auscultation bilaterally; normal respiratory effort CV:         Regular rate and rhythm; no murmurs Abd:      + bowel sounds; soft, non-tender; no palpable masses, no distension Ext:    No edema; adequate peripheral perfusion Skin:      Warm and dry; no rash Neuro: Awake, responsive  Labs/imaging reviewed Significant for ABG showing pH 7.45, pCO2 43, pO2 64 Sodium 129, BUN/creatinine 26/0.91 WBC 14.2, hemoglobin 8.3, platelets 537   Resolved Hospital Problem list     Assessment & Plan:  83 year old male with a plethora of health issues that are well-documented below who presents from outside hospital with new onset of seizures.  LP performed from  outside hospital has no results yet.  He also has a history of stage II lung adenocarcinoma and has not started any therapy for this.  He also has a history of rectal cancer status post colostomy.    Acute on chronic hypoxic, hypercarbic respiratory failure secondary to aspiration versus edema ABG reviewed with normal pCO2 level.  Okay to observe off BiPAP as did not tolerate it.  Continue Venturi mask, wean down oxygen  as tolerated Continue bronchodilators, empiric microbial therapy If no improvement then consider CTA.  He is already on heparin  for atrial fibrillation Continue bronchodilators  New onset's of seizures negative head CT EEG negative Started  on IVIG for possible paraneoplastic disease.  Status post TEVAR, hypertension and coronary artery disease with chronic systolic heart failure and with atrial fibrillation Heparin  drip Cardiology is following  Best Practice (right click and Reselect all SmartList Selections daily)   Diet/type: NPO DVT prophylaxis systemic heparin  Pressure ulcer(s): N/A GI prophylaxis: PPI Lines: N/A Foley:  N/A Code Status:  full code Last date of multidisciplinary goals of care discussion [tbd]  Critical care time: NA   Lonna Coder MD  Pulmonary & Critical care See Amion for pager  If no response to pager , please call [phone] until 7pm After 7:00 pm call Elink  [phone] 04/10/2023, 11:07 AM

## 2023-04-10 NOTE — Procedures (Addendum)
 Patient Name: Curtis Campos  MRN: [id number]  Epilepsy Attending: Arlin MALVA Krebs  Referring Physician/Provider: Voncile Isles, MD  Duration: 04/09/2023 2041 to 04/10/2023 2041   Patient history: 83 year old male with stage II lung adenocarcinoma plans for radiation who presented with altered mental status and proceeded to have focal seizures which then generalized. EEG to evaluate for seizure    Level of alertness: Lethargic   AEDs during EEG study:  VPA, LCM   Technical aspects: This EEG study was done with scalp electrodes positioned according to the 10-20 International system of electrode placement. Electrical activity was reviewed with band pass filter of 1-70Hz , sensitivity of 7 uV/mm, display speed of 25mm/sec with a 60Hz  notched filter applied as appropriate. EEG data were recorded continuously and digitally stored.  Video monitoring was available and reviewed as appropriate.   Description: EEG showed 3-5hz  theta-delta slowing with overriding 13 to 15 Hz beta activity. Generalized periodic discharges (GPD) with triphasic morphology at 1hz  were noted at times with overlying rhythmicity but no evolution. Hyperventilation and photic stimulation were not performed.      ABNORMALITY -Periodic discharges with triphasic morphology, generalized - Continuous slow, generalized   IMPRESSION: This study showed generalized periodic discharges with triphasic morphology which have improved in frequency and morphology compared to previous day. This eeg pattern was most likely interictal in nature. Additionally there is moderate diffuse encephalopathy. No definite seizures were noted  EEG continues to be improving compared to previous day.   Tineshia Becraft O Wrenna Saks

## 2023-04-11 ENCOUNTER — Inpatient Hospital Stay (HOSPITAL_COMMUNITY): Payer: No Typology Code available for payment source

## 2023-04-11 DIAGNOSIS — Z66 Do not resuscitate: Secondary | ICD-10-CM

## 2023-04-11 DIAGNOSIS — R569 Unspecified convulsions: Secondary | ICD-10-CM

## 2023-04-11 DIAGNOSIS — R0609 Other forms of dyspnea: Secondary | ICD-10-CM

## 2023-04-11 DIAGNOSIS — G9341 Metabolic encephalopathy: Secondary | ICD-10-CM | POA: Diagnosis not present

## 2023-04-11 DIAGNOSIS — J9602 Acute respiratory failure with hypercapnia: Secondary | ICD-10-CM | POA: Diagnosis not present

## 2023-04-11 DIAGNOSIS — J9601 Acute respiratory failure with hypoxia: Secondary | ICD-10-CM | POA: Diagnosis not present

## 2023-04-11 DIAGNOSIS — Z515 Encounter for palliative care: Secondary | ICD-10-CM

## 2023-04-11 LAB — BLOOD GAS, ARTERIAL
Acid-Base Excess: 4.9 mmol/L — ABNORMAL HIGH (ref 0.0–2.0)
Bicarbonate: 32.7 mmol/L — ABNORMAL HIGH (ref 20.0–28.0)
O2 Saturation: 98.3 %
Patient temperature: 37.1
pCO2 arterial: 62 mm[Hg] — ABNORMAL HIGH (ref 32–48)
pH, Arterial: 7.33 — ABNORMAL LOW (ref 7.35–7.45)
pO2, Arterial: 85 mm[Hg] (ref 83–108)

## 2023-04-11 LAB — CSF CULTURE W GRAM STAIN: Culture: NO GROWTH

## 2023-04-11 LAB — BASIC METABOLIC PANEL
Anion gap: 11 (ref 5–15)
BUN: 27 mg/dL — ABNORMAL HIGH (ref 8–23)
CO2: 28 mmol/L (ref 22–32)
Calcium: 8.2 mg/dL — ABNORMAL LOW (ref 8.9–10.3)
Chloride: 94 mmol/L — ABNORMAL LOW (ref 98–111)
Creatinine, Ser: 0.84 mg/dL (ref 0.61–1.24)
GFR, Estimated: >60 mL/min (ref >60)
Glucose, Bld: 87 mg/dL (ref 70–99)
Potassium: 3.3 mmol/L — ABNORMAL LOW (ref 3.5–5.1)
Sodium: 133 mmol/L — ABNORMAL LOW (ref 135–145)

## 2023-04-11 LAB — CBC
HCT: 26.2 % — ABNORMAL LOW (ref 39.0–52.0)
Hemoglobin: 8.4 g/dL — ABNORMAL LOW (ref 13.0–17.0)
MCH: 26.8 pg (ref 26.0–34.0)
MCHC: 32.1 g/dL (ref 30.0–36.0)
MCV: 83.4 fL (ref 80.0–100.0)
Platelets: 534 10*3/uL — ABNORMAL HIGH (ref 150–400)
RBC: 3.14 MIL/uL — ABNORMAL LOW (ref 4.22–5.81)
RDW: 15.2 % (ref 11.5–15.5)
WBC: 14 10*3/uL — ABNORMAL HIGH (ref 4.0–10.5)
nRBC: 0.2 % (ref 0.0–0.2)

## 2023-04-11 LAB — HEPARIN LEVEL (UNFRACTIONATED): Heparin Unfractionated: 0.76 [IU]/mL — ABNORMAL HIGH (ref 0.30–0.70)

## 2023-04-11 LAB — MAGNESIUM: Magnesium: 2.1 mg/dL (ref 1.7–2.4)

## 2023-04-11 MED ORDER — ACETAMINOPHEN 325 MG PO TABS
650.0000 mg | ORAL_TABLET | Freq: Four times a day (QID) | ORAL | Status: DC | PRN
Start: 2023-04-11 — End: 2023-04-11

## 2023-04-11 MED ORDER — POTASSIUM CHLORIDE 10 MEQ/100ML IV SOLN: 10.0000 meq | INTRAVENOUS | Status: DC

## 2023-04-11 MED ORDER — SODIUM CHLORIDE 0.9 % IV SOLN
100.0000 mg | Freq: Two times a day (BID) | INTRAVENOUS | Status: DC
  Administered 2023-04-11: 100 mg via INTRAVENOUS
  Filled 2023-04-11 (×2): qty 10

## 2023-04-11 MED ORDER — GLYCOPYRROLATE 1 MG PO TABS
1.0000 mg | ORAL_TABLET | ORAL | Status: DC | PRN
Start: 2023-04-11 — End: 2023-04-11

## 2023-04-11 MED ORDER — GLYCOPYRROLATE 0.2 MG/ML IJ SOLN
0.2000 mg | INTRAMUSCULAR | Status: DC | PRN
Start: 2023-04-11 — End: 2023-04-11

## 2023-04-11 MED ORDER — POLYVINYL ALCOHOL 1.4 % OP SOLN: 1.0000 [drp] | Freq: Four times a day (QID) | OPHTHALMIC | Status: DC | PRN

## 2023-04-11 MED ORDER — POTASSIUM CHLORIDE 20 MEQ PO PACK: 40.0000 meq | PACK | ORAL | Status: DC

## 2023-04-11 MED ORDER — MORPHINE SULFATE (PF) 2 MG/ML IV SOLN
2.0000 mg | INTRAVENOUS | Status: DC | PRN
Start: 2023-04-11 — End: 2023-04-11
  Administered 2023-04-11: 2 mg via INTRAVENOUS
  Filled 2023-04-11: qty 1

## 2023-04-11 MED ORDER — ASPIRIN 300 MG RE SUPP: 300.0000 mg | Freq: Every day | RECTAL | Status: DC

## 2023-04-11 MED ORDER — MORPHINE SULFATE (PF) 2 MG/ML IV SOLN
2.0000 mg | INTRAVENOUS | Status: DC
  Administered 2023-04-11: 2 mg via INTRAVENOUS
  Filled 2023-04-11: qty 1

## 2023-04-11 MED ORDER — GLYCOPYRROLATE 0.2 MG/ML IJ SOLN: 0.2000 mg | INTRAMUSCULAR | Status: DC | PRN

## 2023-04-11 MED ORDER — ACETAMINOPHEN 650 MG RE SUPP
650.0000 mg | Freq: Four times a day (QID) | RECTAL | Status: DC | PRN
Start: 2023-04-11 — End: 2023-04-11

## 2023-04-11 MED ORDER — METOPROLOL TARTRATE 5 MG/5ML IV SOLN: 5.0000 mg | Freq: Four times a day (QID) | INTRAVENOUS | Status: DC

## 2023-04-12 ENCOUNTER — Encounter: Payer: Self-pay | Admitting: *Deleted

## 2023-04-15 ENCOUNTER — Other Ambulatory Visit: Payer: Self-pay

## 2023-04-21 NOTE — Progress Notes (Signed)
Closed referral to [facility]. Patient passed away 02-May-2023.

## 2023-04-22 ENCOUNTER — Ambulatory Visit: Payer: No Typology Code available for payment source

## 2023-04-27 ENCOUNTER — Ambulatory Visit: Payer: No Typology Code available for payment source

## 2023-04-27 ENCOUNTER — Ambulatory Visit: Payer: No Typology Code available for payment source | Admitting: Student

## 2023-04-29 ENCOUNTER — Ambulatory Visit: Payer: No Typology Code available for payment source

## 2023-05-01 NOTE — Progress Notes (Signed)
   Apr 23, 2023 1200  Spiritual Encounters  Type of Visit Initial  Care provided to: Pt and family  Conversation partners present during encounter Nurse  Referral source Family  Reason for visit End-of-life  OnCall Visit Yes   Chaplain responded to request for emotional and spiritual support. The patient's daughter and son were present at bedside. Chaplain provided support to family and staff. Chaplain remains available when needed.

## 2023-05-01 NOTE — Progress Notes (Signed)
   04-13-2023 1600  Spiritual Encounters  Type of Visit Follow up  Care provided to: [facility] partners present during encounter Nurse  Referral source Family  Reason for visit Patient death  OnCall Visit Yes   Chaplain followed-up with family. Chaplain provided hospitality and compassionate presence. Chaplain remains available if needed.

## 2023-05-01 NOTE — Progress Notes (Signed)
 MD, RN and Rapid response notified of pt's deterioration and possible need for intubation. PCCM coming to bedside.

## 2023-05-01 NOTE — Progress Notes (Addendum)
 NAME:  Curtis Campos, MRN:  [id number], DOB:  12/16/1940, LOS: 5 ADMISSION DATE:  04/05/2023, CONSULTATION DATE: 04/09/2023 REFERRING MD: [facility]ad, CHIEF COMPLAINT: Respiratory failure, new onset of seizures  History of Present Illness:  83 year old male full code with the extremely complex past medical history is well-documented below he was noted to have a new onset of seizures which resolved was in antiepileptics and lumbar puncture with results not back yet.  He was transferred to [facility].  He is on heparin  drip for his atrial fibrillation he also is on and off oxygen  by nasal cannula and BiPAP currently on 50% BiPAP with O2 sats of 98%.  Due to the complexity of his care he may need to move to the intensive care unit but currently does not meet the criteria.  Pulmonary critical care will continue to follow and transfer if needed.  Pertinent  Medical History   Past Medical History:  Diagnosis Date   Agent orange exposure    Anemia    Anxiety    Aortic atherosclerosis (HCC)    Aortic stenosis 07/04/2013   a.)TTE 07/04/13: mild (MPG 12; AVA 1.6); b.) TTE 01/01/16: mod (MPG 14; AVA 1.17); c.) TTE 08/30/17: mod (MPG 15; AVA 1.25); d.) TTE 07/13/19: mod (MPG 19; AVA 1.16); e.) TTE 07/23/20: mod (MPG 22; AVA 1.25); f.) TTE 03/12/2021: mod-sev (MPH 34; AVA 0.75); g.) TTE 05/21/2021: mod (MPG 22; AVA 0.8); h.) RHC 08/27/2020: sev (MPG 26.18; AVA 0.69); i.) s/p TAVR 11/25/21: 26 mm Edwards S3 Ultra Resilia   Arthritis 03/12/2011   Cardiomegaly    Cerebral microvascular disease    Cholelithiasis    Chronic hyponatremia    Chronic obstructive lung disease (HCC)    Colostomy in place [facility])    a.) s/p resection secondary to rectal cancer   Coronary artery disease 03/2013   a.) LHC 04/10/2013: 10% mLAD, 50% D1, 30% D2, 99% mLCx, 50% OM1, 40% pRCA, 30% mRCA, 60% RPDA, 40% RPLS - med mgmt; b.) MV 08/31/2019: basal inflat/mid inflat defect c/w ischemia in LCx distribution. EF 37%; c.) R/LHC  08/27/2020: 50% D1, 50% D2, 90% mLCx, 40% p-mRCA, 80% RPDA - med mgmt   Former smoker    HFrEF (heart failure with reduced ejection fraction) (HCC)    a.) TEE 04/13/13: EF 20-25, glob HK, G1DD, mild LAE, mod-sev MR; b.) TTE 08/30/17: EF 45-50, LVH, mild LAE, G1DD, mod AS, mod MR; c.) TTE 07/13/19: EF 30-35, glob HK, G1DD, RVSP 49.4, mild LAE, mild-mod MR, mod AS; d.) TTE 03/12/21: EF 55, G2DD, mild MR, mod-sev AS; e.) TTE 12/24/21: EF 50, mild LVH, G1DD, RVSP 40.5, mild-mod MR, triv AR; f.) TTE 11/18/22: EF 45-50, glob HK, G1DD, mild MR   History of bilateral cataract extraction    Hyperlipidemia    Hypertension    Hypokalemia    Ischemic cardiomyopathy    a.) TTE 04/08/2014: EF 25-30%; b.) TEE 04/13/2013: EF 20-25%; c.) TTE 07/04/2013: EF 30-35%; d.) TTE 01/01/2016: EF 40-45%; e.) TTE 08/30/2017: EF 45-50%; f.) TTE 07/03/2019: EF 30-35%; g.) MV 08/31/2019: EF 37%; h.) TTE 07/23/20: EF 55-60%; i.) TTE 03/12/2021: EF 55%; j.) TTE 05/21/2021: EF 40-45%; k.) TTE 11/26/2021: EF 45-50%; l.) TTE 12/24/2021: EF 50%; m.) TTE 11/18/2022: EF 45-50%   Long term (current) use of aspirin     Mass of upper lobe of left lung 03/10/2021   a.) CTA chest 03/10/2021: 6.1 mm; b.) CTA chest 10/15/2021: 8 mm; c.) PET CT 11/14/2021: 8 mm (SUV max  2.8); d.) CXR 10/05/2022: interval increase to 2.7 cm; e.) CXR 01/18/2023: interval increase to 3.7 cm; f.) CT chest 01/22/2023: 3.8 x 3.2 cm; g.) PET CT 02/01/2023: 3.8 cm and markedly hypermetabolic with SUV max of 43   Multifocal atrial tachycardia (HCC)    Myocardial infarction [facility])    a.) suspected by cardiology during 03/2013 admission for CP/CAP/new onset CHF --> TTE  showed severe LV dysfunction with segmental wall motion abnormalities and severe MR (posteriorly-directed  jet)   Pneumonia    Pre-diabetes    Pulmonary hypertension (HCC)    a.) TTE 04/08/2013: RVSP 62.2; b.) TTE 07/13/2019: RVSP 49.4; c.) TTE 07/23/2020: RVSP 33.2; d.) TTE 11/26/2021: RVSP 41.2; e.) TTE  12/24/2021: RVSP 40.5; f.) TTE 11/18/2019: RVSP 29.8   Rectal cancer (HCC)    Requires continuous at home supplemental oxygen     a.) 4L/[place]   S/P TAVR (transcatheter aortic valve replacement) 11/25/2021   a.) 26mm Edwards Sapien 3 Ultra Resilia via the TF approach   Statin intolerance    Umbilical hernia (s/p repair)     Significant Hospital Events: Including procedures, antibiotic start and stop dates in addition to other pertinent events   1/6 admit 1/10 PCCM consult for respiratory distress, started on BiPAP  Interim History / Subjective:   Had a respiratory deterioration overnight.  Not tolerated BiPAP and is now on high flow nasal cannula and nonrebreather with low O2 sats.  More delirious and rhonchorous.  Working hard to breathe.  Objective   Blood pressure (!) 152/70, pulse 99, temperature 99.1 F (37.3 C), temperature source Axillary, resp. rate 16, height 5' 6 (1.676 m), weight 82.7 kg, SpO2 90%.    FiO2 (%):  [55 %-100 %] 100 % PEEP:  [10 cmH20] 10 cmH20   Intake/Output Summary (Last 24 hours) at 08-May-2023 0749 Last data filed at 05/08/23 0327 Gross per 24 hour  Intake 740.17 ml  Output 2860 ml  Net -2119.83 ml   Filed Weights   04/06/23 0430 04/08/23 0418 04/09/23 0448  Weight: 75.3 kg 67.6 kg 82.7 kg    Examination: Gen:   Moderate respiratory distress, chronically ill-appearing elderly gentleman HEENT:  EOMI, sclera anicteric Neck:     No masses; no thyromegaly Lungs:    Bilateral rhonchi CV:         Regular rate and rhythm; no murmurs Abd:      + bowel sounds; soft, non-tender; no palpable masses, no distension Ext:    No edema; adequate peripheral perfusion Skin:      Warm and dry; no rash Neuro: Awake, confused.  Unable to answer questions  Labs/imaging reviewed Significant for ABG showing ABG showing pH 7.33, pCO2 62, pO2 85 Chest x-ray with persistent lung mass, bibasal infiltrates/atelectasis, effusion.   Resolved Hospital Problem list      Assessment & Plan:  83 year old male with a plethora of health issues that are well-documented below who presents from outside hospital with new onset of seizures.  LP performed from outside hospital has no results yet.  He also has a history of stage II lung adenocarcinoma and has not started any therapy for this.  He also has a history of rectal cancer status post colostomy.    Acute on chronic hypoxic, hypercarbic respiratory failure secondary to aspiration versus edema On bronchodilators, empiric microbial therapy with continued deterioration  New onset's of seizures negative head CT EEG negative Started on IVIG for possible paraneoplastic disease.  Status post TEVAR, hypertension and coronary artery disease with  chronic systolic heart failure and with atrial fibrillation Heparin  drip Cardiology is following Getting diuresed adequately  Goals of care Had a deterioration overnight and appears agonal.  I called and discussed with the son Curtis Campos over the telephone and explained that in spite of adequate medical management his father is doing worse.  If we are to continue the current level of care then he will need immediate intubation.  I expressed my concerns that he would likely not come off the ventilator if he were to go on life support and we will be prolonging his distress.  His son agreed that we need to focus on comfort and has agreed to full comfort measures.  Orders placed.  Best Practice (right click and Reselect all SmartList Selections daily)   Diet/type: NPO DVT prophylaxis systemic heparin  Pressure ulcer(s): N/A GI prophylaxis: PPI Lines: N/A Foley:  N/A Code Status:  DNR with comfort measures. Last date of multidisciplinary goals of care discussion [tbd]  Critical care time:   The patient is critically ill with multiple organ system failure and requires high complexity decision making for assessment and support, frequent evaluation and titration of  therapies, advanced monitoring, review of radiographic studies and interpretation of complex data.   Critical Care Time devoted to patient care services, exclusive of separately billable procedures, described in this note is 35 minutes.   Abeeha Twist MD [place] [facility] See Amion for pager  If no response to pager , please call [phone] until 7pm After 7:00 pm call Elink  [phone] Apr 20, 2023, 7:57 AM

## 2023-05-01 NOTE — Progress Notes (Signed)
 LTM maint complete - no new skin breakdown seen. Hairnet placed, mittens on both hands.     [facility] monitored, Event button test confirmed by [facility].

## 2023-05-01 NOTE — Progress Notes (Signed)
 LTM maint complete - no new skin breakdown seen. Leads serviced, hair net placed respiratory tech placed  air mask, mittens on both hands. Patient appeared awake. [facility] monitored, Event button test confirmed by [facility].

## 2023-05-01 NOTE — Plan of Care (Signed)
 Patient now CMO.  Please call us if we can be of any assistance.   -- Milon Dikes, MD Neurologist [facility]

## 2023-05-01 NOTE — Procedures (Signed)
 Patient Name: Curtis Campos  MRN: [id number]  Epilepsy Attending: Arlin MALVA Krebs  Referring Physician/Provider: Voncile Isles, MD  Duration: 04/10/2023 2041 to 2023-04-28 9187   Patient history: 83 year old male with stage II lung adenocarcinoma plans for radiation who presented with altered mental status and proceeded to have focal seizures which then generalized. EEG to evaluate for seizure    Level of alertness: Lethargic   AEDs during EEG study:  VPA, LCM   Technical aspects: This EEG study was done with scalp electrodes positioned according to the 10-20 International system of electrode placement. Electrical activity was reviewed with band pass filter of 1-70Hz , sensitivity of 7 uV/mm, display speed of 28mm/sec with a 60Hz  notched filter applied as appropriate. EEG data were recorded continuously and digitally stored.  Video monitoring was available and reviewed as appropriate.   Description: EEG showed continuous generalized rhythmic 3-5hz  theta-delta slowing with overriding 13 to 15 Hz beta activity. Hyperventilation and photic stimulation were not performed.      ABNORMALITY - Continuous slow, generalized   IMPRESSION: This study was suggestive of moderate to severe diffuse encephalopathy. No  seizures were noted.     Curtis Campos

## 2023-05-01 NOTE — Progress Notes (Signed)
 Pharmacy Antibiotic Note  Curtis Campos is a 83 y.o. male admitted on 04/05/2023 with aspiration pneumonia.  Pharmacy has been consulted for Unasyn  dosing.  Renal function is stable, WBC continues to trend down. Patient is on day 4 on Unasyn . Current Unasyn  dosing remains appropriate.  Plan: Continue Unasyn  3g IV q6h Follow up renal function, cultures as available, clinical progress, length of tx  Height: 5' 6 (167.6 cm) Weight: 82.7 kg (182 lb 5.1 oz) IBW/kg (Calculated) : 63.8  Temp (24hrs), Avg:98.1 F (36.7 C), Min:97.4 F (36.3 C), Max:99.1 F (37.3 C)  Recent Labs  Lab 04/07/23 0536 04/07/23 0630 04/08/23 0715 04/08/23 0921 04/09/23 0701 04/10/23 0627 04/30/2023 0446  WBC  --  23.9*  --  18.4* 17.0* 14.2* 14.0*  CREATININE 0.74  --  0.61  --  0.71 0.91 0.84    Estimated Creatinine Clearance: 68.5 mL/min (by C-G formula based on SCr of 0.84 mg/dL).    Allergies  Allergen Reactions   Codeine Nausea Only   Statins Other (See Comments)    REACTION: myalgias    Antimicrobials this admission: 1/8 vanc>>1/9 1/8 ceftriaxone >>1/9 1/8 ampicillin >>1/9 1/8 acyclovir >>1/9 1/9 unasyn  >>  Dose adjustments this admission:  Microbiology results: 1/9 meningitis panel neg 1/8 CSF cx: ngtd x2  Thank you for allowing pharmacy to be a part of this patient's care.  Nidia Schaffer, PharmD PGY1 Pharmacy Resident  Please check AMION for all [facility] Pharmacy phone numbers After 10:00 PM, call Main Pharmacy [phone] 04/30/2023 7:46 AM

## 2023-05-01 NOTE — Consult Note (Signed)
 Consultation Note Date: Apr 29, 2023   Patient Name: Curtis Campos  DOB: [birth year]  MRN: [id number]  Age / Sex: 83 y.o., male  PCP: [name], MD Referring Physician: Judeth Trenda BIRCH, MD  Reason for Consultation: Establishing goals of care, Non pain symptom management, and Psychosocial/spiritual support  HPI/Patient Profile: 83 y.o. male   admitted on 04/05/2023 with history of stage II lung adenocarcinoma currently plan for upcoming radiation therapy, remote history of rectal cancer status post colostomy, aortic stenosis/TAVR, CAD, CHF, hypertension, hyperlipidemia, COPD/on 4 L O2 at baseline, HLD, A-fib brought in from home initially to [facility] due to altered mental status and possible seizure-like episode.  After initial assessment, transferred to [facility] for further management.  Neurology adjusting AEDs.  Hospital course complicated by aspiration pneumonia.  Patient had further decompensation with worsening hypoxia, lethargy and mental status changes.  After conversation with PCCM family made decision to shift to full comfort path.  Clinical Assessment and Goals of Care:   This NP Ronal Plants reviewed medical records, received report from team, assessed the patient and then meet at the patient's bedside along with patient's son and his wife, daughter, and patient's spouse to discuss diagnosis, prognosis, GOC, EOL wishes disposition and options.   Concept of Palliative Care was introduced as specialized medical care for people and their families living with serious illness.  If focuses on providing relief from the symptoms and stress of a serious illness.  The goal is to improve quality of life for both the patient and the family.Values and goals of care important to patient and family were attempted to be elicited.  Created space and opportunity for patient  and family to explore  thoughts and feelings regarding current medical situation.    Family verbalized understanding of the seriousness of the current medical situation and the associated limited prognosis.  Focus of care is comfort and dignity allowing for a natural death.      Detailed the  difference between an aggressive medical intervention path  and a palliative comfort care path for this patient at this time was had.      Natural trajectory and expectations at EOL were discussed.    Questions and concerns addressed.  Family encouraged to call with questions or concerns.     PMT will continue to support holistically.          No documented H POA or advance care planning documents noted in [facility].  Patient's spouse along with her family are making decisions together in the patient's best interest.  NEXT OF KIN    SUMMARY OF RECOMMENDATIONS    Code Status/Advance Care Planning: DNR   Symptom Management:  Pain/dyspnea-morphine  2 mg IV  scheduled every 4 hours and morphine  2 to 4 mg IV every 30 minutes as needed Education offered on the utilization of medication to manage symptoms at end-of-life.  Discussed utilization of continuous opioid drip if needed for symptom management,   Palliative Prophylaxis:  Frequent Pain Assessment and Oral Care  Additional Recommendations (Limitations, Scope, Preferences): Full Comfort Care  Psycho-social/Spiritual:  Desire for further Chaplaincy support:yes Additional Recommendations: Grief/Bereavement Support  Prognosis:  Hours - Days  Discharge Planning: Anticipated Hospital Death      Primary Diagnoses: Present on Admission: **None**   I have reviewed the medical record, interviewed the patient and family, and examined the patient. The following aspects are pertinent.  Past Medical History:  Diagnosis Date   Agent orange exposure    Anemia    Anxiety    Aortic atherosclerosis (HCC)    Aortic stenosis 07/04/2013   a.)TTE 07/04/13: mild (MPG  12; AVA 1.6); b.) TTE 01/01/16: mod (MPG 14; AVA 1.17); c.) TTE 08/30/17: mod (MPG 15; AVA 1.25); d.) TTE 07/13/19: mod (MPG 19; AVA 1.16); e.) TTE 07/23/20: mod (MPG 22; AVA 1.25); f.) TTE 03/12/2021: mod-sev (MPH 34; AVA 0.75); g.) TTE 05/21/2021: mod (MPG 22; AVA 0.8); h.) RHC 08/27/2020: sev (MPG 26.18; AVA 0.69); i.) s/p TAVR 11/25/21: 26 mm Edwards S3 Ultra Resilia   Arthritis 03/12/2011   Cardiomegaly    Cerebral microvascular disease    Cholelithiasis    Chronic hyponatremia    Chronic obstructive lung disease (HCC)    Colostomy in place [facility])    a.) s/p resection secondary to rectal cancer   Coronary artery disease 03/2013   a.) LHC 04/10/2013: 10% mLAD, 50% D1, 30% D2, 99% mLCx, 50% OM1, 40% pRCA, 30% mRCA, 60% RPDA, 40% RPLS - med mgmt; b.) MV 08/31/2019: basal inflat/mid inflat defect c/w ischemia in LCx distribution. EF 37%; c.) R/LHC 08/27/2020: 50% D1, 50% D2, 90% mLCx, 40% p-mRCA, 80% RPDA - med mgmt   Former smoker    HFrEF (heart failure with reduced ejection fraction) (HCC)    a.) TEE 04/13/13: EF 20-25, glob HK, G1DD, mild LAE, mod-sev MR; b.) TTE 08/30/17: EF 45-50, LVH, mild LAE, G1DD, mod AS, mod MR; c.) TTE 07/13/19: EF 30-35, glob HK, G1DD, RVSP 49.4, mild LAE, mild-mod MR, mod AS; d.) TTE 03/12/21: EF 55, G2DD, mild MR, mod-sev AS; e.) TTE 12/24/21: EF 50, mild LVH, G1DD, RVSP 40.5, mild-mod MR, triv AR; f.) TTE 11/18/22: EF 45-50, glob HK, G1DD, mild MR   History of bilateral cataract extraction    Hyperlipidemia    Hypertension    Hypokalemia    Ischemic cardiomyopathy    a.) TTE 04/08/2014: EF 25-30%; b.) TEE 04/13/2013: EF 20-25%; c.) TTE 07/04/2013: EF 30-35%; d.) TTE 01/01/2016: EF 40-45%; e.) TTE 08/30/2017: EF 45-50%; f.) TTE 07/03/2019: EF 30-35%; g.) MV 08/31/2019: EF 37%; h.) TTE 07/23/20: EF 55-60%; i.) TTE 03/12/2021: EF 55%; j.) TTE 05/21/2021: EF 40-45%; k.) TTE 11/26/2021: EF 45-50%; l.) TTE 12/24/2021: EF 50%; m.) TTE 11/18/2022: EF 45-50%   Long term (current)  use of aspirin     Mass of upper lobe of left lung 03/10/2021   a.) CTA chest 03/10/2021: 6.1 mm; b.) CTA chest 10/15/2021: 8 mm; c.) PET CT 11/14/2021: 8 mm (SUV max 2.8); d.) CXR 10/05/2022: interval increase to 2.7 cm; e.) CXR 01/18/2023: interval increase to 3.7 cm; f.) CT chest 01/22/2023: 3.8 x 3.2 cm; g.) PET CT 02/01/2023: 3.8 cm and markedly hypermetabolic with SUV max of 43   Multifocal atrial tachycardia (HCC)    Myocardial infarction [facility])    a.) suspected by cardiology during 03/2013 admission for CP/CAP/new onset CHF --> TTE  showed severe LV dysfunction with segmental wall motion abnormalities and severe MR (posteriorly-directed  jet)   Pneumonia    Pre-diabetes  Pulmonary hypertension (HCC)    a.) TTE 04/08/2013: RVSP 62.2; b.) TTE 07/13/2019: RVSP 49.4; c.) TTE 07/23/2020: RVSP 33.2; d.) TTE 11/26/2021: RVSP 41.2; e.) TTE 12/24/2021: RVSP 40.5; f.) TTE 11/18/2019: RVSP 29.8   Rectal cancer (HCC)    Requires continuous at home supplemental oxygen     a.) 4L/[place]   S/P TAVR (transcatheter aortic valve replacement) 11/25/2021   a.) 26mm Edwards Sapien 3 Ultra Resilia via the TF approach   Statin intolerance    Umbilical hernia (s/p repair)    Social History   Socioeconomic History   Marital status: Married    Spouse name: Dorothyann   Number of children: 2   Years of education: Not on file   Highest education level: Not on file  Occupational History   Not on file  Tobacco Use   Smoking status: Former    Current packs/day: 0.00    Average packs/day: 1 pack/day for 50.0 years (50.0 ttl pk-yrs)    Types: Cigarettes    Start date: 03/26/1963    Quit date: 03/25/2013    Years since quitting: 10.0   Smokeless tobacco: Never  Vaping Use   Vaping status: Never Used  Substance and Sexual Activity   Alcohol  use: No    Alcohol /week: 0.0 standard drinks of alcohol    Drug use: No   Sexual activity: Not Currently  Other Topics Concern   Not on file  Social History Narrative    Arm '66-'68, [place], agent orange exposure.    Social Drivers of Corporate Investment Banker Strain: Low Risk  (01/26/2023)   Overall Financial Resource Strain (CARDIA)    Difficulty of Paying Living Expenses: Not hard at all  Food Insecurity: No Food Insecurity (04/06/2023)   Hunger Vital Sign    Worried About Running Out of Food in the Last Year: Never true    Ran Out of Food in the Last Year: Never true  Transportation Needs: No Transportation Needs (04/06/2023)   PRAPARE - Administrator, Civil Service (Medical): No    Lack of Transportation (Non-Medical): No  Physical Activity: Not on file  Stress: Not on file  Social Connections: Socially Integrated (04/07/2023)   Social Connection and Isolation Panel [NHANES]    Frequency of Communication with Friends and Family: More than three times a week    Frequency of Social Gatherings with Friends and Family: More than three times a week    Attends Religious Services: 1 to 4 times per year    Active Member of [employer] or Organizations: Yes    Attends Banker Meetings: Never    Marital Status: Married   Family History  Problem Relation Age of Onset   Heart disease Mother    Heart disease Father    Hyperlipidemia Father    Hypertension Father    Heart disease Sister        stents placed    Cancer - Cervical Sister    Heart disease Sister        CABG   Heart attack Brother 47       MI   Scheduled Meds:  aspirin   300 mg Rectal Daily   budesonide  (PULMICORT ) nebulizer solution  0.5 mg Nebulization BID   Chlorhexidine  Gluconate Cloth  6 each Topical Daily   ipratropium-albuterol   3 mL Nebulization Q4H   metoprolol  tartrate  5 mg Intravenous Q6H   sodium chloride  flush  3 mL Intravenous Q12H   thiamine  (VITAMIN B1) injection  100  mg Intravenous Daily   Continuous Infusions:  ampicillin -sulbactam (UNASYN ) IV 3 g (2023-05-06 9385)   heparin  1,700 Units/hr (05-06-23 0031)   Immune Globulin  10% 240 mL/hr at  04/10/23 1701   lacosamide  (VIMPAT ) IV     potassium chloride      valproate sodium  500 mg (04/10/23 2337)   PRN Meds:.acetaminophen  **OR** acetaminophen , glycopyrrolate  **OR** glycopyrrolate  **OR** glycopyrrolate , LORazepam , morphine  injection, ondansetron  (ZOFRAN ) IV, polyvinyl alcohol  Medications Prior to Admission:  Prior to Admission medications   Medication Sig Start Date End Date Taking? Authorizing Provider  acetaminophen  (TYLENOL ) 500 MG tablet Take 500 mg by mouth at bedtime.   Yes [provider]  albuterol  (PROVENTIL ) (2.5 MG/3ML) 0.083% nebulizer solution Take 2.5 mg by nebulization at bedtime. 10/13/21  Yes [provider]  albuterol  (VENTOLIN  HFA) 108 (90 Base) MCG/ACT inhaler INHALE 2 PUFFS BY MOUTH EVERY 6 HOURS AS NEEDED FOR SHORTNESS OF BREATH Patient taking differently: 1-2 puffs every 6 (six) hours as needed. INHALE 2 PUFFS BY MOUTH EVERY 6 HOURS AS NEEDED FOR SHORTNESS OF BREATH 08/29/18  Yes Clark, Katherine K, NP  Alirocumab  (PRALUENT ) 75 MG/ML SOAJ Inject 1 pen into the skin every 14 (fourteen) days. 08/25/18  Yes Darron Deatrice LABOR, MD  aspirin  81 MG tablet Take 81 mg by mouth at bedtime.   Yes [provider]  carvedilol  (COREG ) 6.25 MG tablet Take 3.125 mg by mouth 2 (two) times daily with a meal.   Yes [provider]  cholecalciferol  (VITAMIN D3) 25 MCG (1000 UNIT) tablet Take 1,000 Units by mouth daily.   Yes [provider]  dapagliflozin  propanediol (FARXIGA ) 10 MG TABS tablet TAKE ONE TABLET BY MOUTH EVERY MORNING BEFORE BREAKFAST 02/24/23  Yes Darron Deatrice LABOR, MD  diclofenac Sodium (VOLTAREN) 1 % GEL 2 g as needed. 04/08/21  Yes [provider]  fluticasone -salmeterol (ADVAIR) 250-50 MCG/ACT AEPB Inhale 1 puff into the lungs in the morning and at bedtime. 08/31/22  Yes [provider]  Magnesium  Oxide 400 (240 Mg) MG TABS TAKE 1 TABLET BY MOUTH ONCE DAILY. Patient taking differently: Take 400 mg by mouth  daily. 05/03/17  Yes Darron Deatrice LABOR, MD  OXYGEN  Inhale 4 L into the lungs continuous.   Yes [provider]  sacubitril -valsartan  (ENTRESTO ) 49-51 MG Take half a tablet twice daily 02/23/23  Yes Darron Deatrice LABOR, MD  sodium chloride  1 g tablet Take 1 g by mouth daily with lunch. 1/2 tab 01/20/23 01/20/24 Yes [provider]  torsemide  (DEMADEX ) 20 MG tablet Take 20 mg by mouth daily.   Yes [provider]  traZODone  (DESYREL ) 50 MG tablet Take 1 tablet (50 mg total) by mouth at bedtime as needed. for sleep 08/29/18  Yes Clark, Katherine K, NP   Allergies  Allergen Reactions   Codeine Nausea Only   Statins Other (See Comments)    REACTION: myalgias   Review of Systems  Unable to perform ROS: Acuity of condition    Physical Exam Constitutional:      Appearance: He is underweight. He is ill-appearing.     Comments: HFNC  Cardiovascular:     Rate and Rhythm: Normal rate.  Musculoskeletal:     Comments: Generalized weakness and muscle atrophy  Skin:    General: Skin is cool.     Vital Signs: BP (!) 147/72 (BP Location: Left Arm)   Pulse 98   Temp 98.8 F (37.1 C) (Axillary)   Resp (!) 27   Ht 5' 6 (1.676 m)  Wt 82.7 kg   SpO2 91%   BMI 29.43 kg/m  Pain Scale: Faces POSS *See Group Information*: 2-Acceptable,Slightly drowsy, easily aroused Pain Score: 0-No pain   SpO2: SpO2: 91 % O2 Device:SpO2: 91 % O2 Flow Rate: .O2 Flow Rate (L/min): 60 L/min  IO: Intake/output summary:  Intake/Output Summary (Last 24 hours) at 2023/05/07 0818 Last data filed at 05/07/2023 0327 Gross per 24 hour  Intake 740.17 ml  Output 2860 ml  Net -2119.83 ml    LBM: Last BM Date : 04/10/23 Baseline Weight: Weight: 75.5 kg Most recent weight: Weight: 82.7 kg     Palliative Assessment/Data:     Time   42 Minutes   discussed with Dr. Judeth and Dr. Deedra Signed by: Ronal Plants, NP   Please contact Palliative Medicine Team phone at [phone] for questions  and concerns.  For individual provider: See Tracey

## 2023-05-01 NOTE — Progress Notes (Signed)
 LTM EEG discontinued - no skin breakdown at [facility].

## 2023-05-01 NOTE — Progress Notes (Addendum)
 Received a call from bedside RN regarding the patient becoming acutely hypoxic with O2 saturation in the 70s on nonrebreather mask.  Presented at bedside.  The patient was already seen by Alm Burner, rapid response team leader.  The patient is on BiPAP and resting comfortably.  Chest x-ray and ABG obtained.    Chest x-ray, personally reviewed, showing bilateral pulmonary infiltrates suggestive of multifocal pneumonia.  The patient is on Unasyn  for aspiration pneumonia.  ABG result is pending.    No episode of emesis, excessive oral secretions, or GI bleed reported.  The patient is able to protect his airway at this time.  Made n.p.o. while on BiPAP.  Aspiration precautions are in place.  Ordered high flow nasal cannula to use in the setting of delirium and new onset epilepsy once the patient is weaned off BiPAP this morning.  Defer further management to pulmonary medicine.  Time: 15 minutes.

## 2023-05-01 NOTE — Progress Notes (Signed)
 Opens eyes when name is called but is not tracking at this time.  Did nod his head that he is comfortable.  Son at daughter-in-law at his side and Chaplain did come up to see them as well.  Appears comfortable and family denies any needs and no questions at this time .

## 2023-05-01 NOTE — Death Summary Note (Signed)
 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  Other major issues contributing to demise Acute on chronic hypoxic and hypercapnic respiratory failure Aspiration pneumonia Decompensated CHF Acute metabolic encephalopathy   Hospital Diagnoses: Principal Problem:   Seizure (HCC) Active Problems:   Witnessed seizure-like activity (HCC)   Atrial fibrillation with RVR (HCC)   Persistent atrial fibrillation (HCC)   Altered mental status   History of transcatheter aortic valve replacement (TAVR)   Chronic heart failure with mildly reduced ejection fraction (HFmrEF, 41-49%) (HCC)   Atherosclerosis of native coronary artery of native heart without angina pectoris   History of cardioversion   Current use of long term anticoagulation   Brief Hospital Course:   83 y.o. male with hx of stage II lung adenocarcinoma currently planned for upcoming radiation therapy, remote history of rectal cancer status post colostomy, aortic stenosis status post bioprosthetic TAVR, coronary artery disease, chronic systolic heart failure, hypertension, hyperlipidemia, COPD, chronic hypoxic respiratory failure on 4 L O2 at baseline, HLD (with statin intolerance), paroxysmal atrial fibrillation and chronic hyponatremia, who was brought in from home initially to [facility] ED due to altered mental status followed by what appeared to be possible seizure-like episode.  Family noted that patient was confused, was not making sense, unable to answer questions and while transporting him to ED, noted that he stiffened up.  In the ED he had witnessed generalized tonic-clonic seizures aborted with Ativan  2 mg.  Evaluated by neurology for  seizures and Cardiology for A-fib with RVR and transferred to [facility] for LTM EEG and further management.  Neurology adjusting AEDs, on LTM EEG, underwent fluoroscopic guided LP 1/9 to rule out carcinomatous leptomeningitis-lab work pending.  Infectious meningitis ruled out.  Course complicated by aspiration pneumonia, acute on chronic diastolic CHF, acute on chronic hypoxic and hypercapnic respiratory failure needing BiPAP.  PCCM consulted 1/10   05-01-23: Progressively worsened overnight with worsening hypoxia, lethargy and mental status changes.  Seen patient at bedside along with rapid response RN, RT and PCCM. Dr. Theophilus, PCCM discussed with HCPOA who opted to transition to full comfort care.  Palliative care consulted for symptom management.  Patient eventually demised on 05-01-23 at 1445 hrs.  Below are the details of his hospitalization.     Assessment & Plan:    New onset epilepsy  Presented with altered mental status, focal seizures followed by generalized seizures Neurology consulted.  Differentials include neoplastic versus paraneoplastic encephalitis MRI brain without acute findings and specifically no evidence of metastatic disease. Has been on LTM EEG since admission to [facility].  EEG continued to be abnormal over several days.  Per LTM EEG report 1/11, EEG continues to be improving compared to previous day. Neurology actively managed AEDs.  Several adjustments have been made since admission.  Lastly was on Vimpat  100 Mg twice daily, valproate 500 Mg IV every 8 hours.  Got a loading dose of IV valproate 1 g on 1/11 (valproate level low, 28). Given underlying stage II lung adenocarcinoma, underwent LP by IR.  Bacterial meningitis ruled out (CSF meningitis/encephalitis panel negative, CSF culture negative to date).  Empirically started antibiotics for meningitis were stopped. Was on IVIG x 5 days empirically for paraneoplastic  disease.  CSF paraneoplastic autoantibody profile: Negative Remained quite  confused and may be hallucinating.   Aspiration pneumonia Chest x-ray 1/9 reviewed, aspiration pneumonia.  Repeat chest x-ray 1/10 suggestive of pulmonary edema more than pneumonia.  I personally reviewed chest x-ray and agree with findings. Treated with IV Unasyn , day 4/7.  Aspiration precautions. Procalcitonin 0.57 (was negative on admission)   Acute metabolic encephalopathy Likely multifactorial related to ongoing seizure-like activity, multiple AEDs, acute respiratory failure with hypoxia and hypercarbia, infectious etiology such as aspiration pneumonia and hospital delirium.  Ruled out bacterial meningitis. UA negative. B12: 668.  TSH normal Has did not really make sustained improvement over the last several days.   Paroxysmal atrial fibrillation with RVR At [facility] ED, noted to be RVR with diffuse ST segment depressions. Cardiology consulted IV Cardizem  x 1, briefly on Cardizem  drip, now stopped, due to pulmonary issues.  Not on AC PTA, was on IV heparin .   Reverted and remained in NSR   Acute on chronic respiratory failure with hypoxia and hypercarbia On home oxygen  4 L/min via [place] at baseline Influenza, RSV and SARS coronavirus 2 by RT-PCR negative. Was weaned down from HFNC 9-10 L/min oxygen  to 4 L/min yesterday.  However overnight on 1/9, progressive hypoxia lethargy, started and remains on a BiPAP. ABG 1/9: pH 7.31, pCO2 57, pO2 111.  VBG 1/10: pH 7.41, pCO2 41, pO2 65. Acute respiratory failure due to aspiration pneumonia and acute on chronic combined CHF, complicating acute metabolic encephalopathy. Chronic respiratory failure due to underlying advanced COPD, lung scarring and lung mass. CCM on board. Patient did not tolerate BiPAP and is on Ventimask 14 L/min.  Despite Ventimask, overnight progressive hypoxia.  ABG showed pH of 7.3, pCO2 62, pO2 85.    Acute on chronic combined systolic and diastolic CHF TTE 1/75 with LVEF 45-50% and grade 1 diastolic dysfunction.  Repeat  echo 1/9 with similar EF. On GDMT with Toprol -XL 50 Mg daily, Entresto  and torsemide  20 Mg daily.  Resume Farxiga  at discharge. Cardiology following. Follow-up chest x-ray 1/10 and 1/11 shows pulmonary edema >pneumonia.  Apart from some upper extremity edema, does not appear volume overloaded, no JVD. IV Lasix  40 mg x 1 on 1/10 and 1/11.  On torsemide  20 Mg daily-discontinued due to n.p.o. Chest x-ray personally reviewed: Left lung with pulmonary edema versus infiltrate findings.  Right lung relatively to be clear   CAD No anginal symptoms. Currently on aspirin  81 Mg daily-Per cardiology to be discontinued once he started on DOAC. Resume Praluent  and Zetia  at discharge. Aspirin  changed rectal.   COPD   Mixed hyperlipidemia Intolerant to statins   Hypokalemia Replaced IV.  Magnesium  normal.   Hyponatremia May be multifactorial, now may be somewhat volume overloaded.?  SIADH.   Also suspect some of this is chronic given that he is on salt tablets at home. Completed brief and gentle IV normal saline hydration. Serum sodium up to 133.   Stage II adenocarcinoma of lung .   Leukocytosis WBC had peaked to 23.9. Now treated for aspiration pneumonia.  Bacterial meningitis ruled out.  On IV Unasyn .     Anemia of chronic disease Stable.   Severe aortic stenosis s/p TAVR 8/23    Adult failure to thrive: Multifactorial due to very advanced age and multiple severe chronic and acute rectal problems as listed above.   Body mass index is 29.43 kg/m.     Consultants:   Neurology Cardiology IR PCCM Palliative care medicine   Procedures:  LTM EEG Fluoroscopic guided LP by IR on 1/9. BiPAP   The results of significant diagnostics from this hospitalization (including imaging, microbiology, ancillary and laboratory) are listed below for reference.   Significant Diagnostic Studies: DG Chest Port 1 View Result Date: 2023-05-09 CLINICAL DATA:  Acute respiratory distress. EXAM:  PORTABLE CHEST 1 VIEW COMPARISON:  04/10/2023 FINDINGS: Cardiomegaly status post TAVR. Bibasilar collapse/consolidation again noted with bilateral pleural effusions. Left upper lobe pulmonary masslike opacity is similar to prior. Bones are diffusely demineralized. Telemetry leads overlie the chest. IMPRESSION: 1. No significant interval change. 2. Bibasilar collapse/consolidation with bilateral pleural effusions. 3. Left upper lobe pulmonary masslike opacity is similar to prior. Electronically Signed   By: Camellia Candle M.D.   On: 05-09-23 06:31   DG CHEST PORT 1 VIEW Result Date: 04/10/2023 CLINICAL DATA:  [id number] Acute on chronic respiratory failure with hypoxia [facility]) [id number] EXAM: PORTABLE CHEST 1 VIEW COMPARISON:  04/09/2023 FINDINGS: Left upper lobe mass again noted, unchanged. Diffuse interstitial prominence again noted, most pronounced in the lower lungs. Layering bilateral effusions with bibasilar airspace opacities. Cardiomediastinal contours are stable. No acute bony abnormality. IMPRESSION: Stable interstitial prominence with lower lobe predominance, favor edema. Layering bilateral effusions. Stable left upper lobe mass. Electronically Signed   By: Franky Crease M.D.   On: 04/10/2023 08:40   DG CHEST PORT 1 VIEW Result Date: 04/09/2023 CLINICAL DATA:  83 year old male with respiratory failure and hypoxia. Known cancer. EXAM: PORTABLE CHEST 1 VIEW COMPARISON:  Portable chest yesterday and earlier. FINDINGS: Portable AP semi upright view at 0714 hours. Stable lung volumes and mediastinal contours. Prior TAVR. Calcified aortic atherosclerosis. Visualized tracheal air column is within normal limits. No pneumothorax. But progressive coarse pulmonary interstitial opacity compared to yesterday, basilar predominant. Left upper lobe lung mass approximately 5 cm redemonstrated (see chest CTA report 03/06/2023). Small pleural effusions not significantly changed. IMPRESSION: 1. Progressive pulmonary  interstitial opacity since yesterday, pulmonary interstitial edema favored over atypical/viral infection. 2. Known left upper lobe lung mass. Small pleural effusions not significantly changed. Electronically Signed   By: VEAR Hurst M.D.   On: 04/09/2023 07:38   DG CHEST PORT 1 VIEW Result Date: 04/08/2023 CLINICAL DATA:  Acute respiratory failure with hypoxia. EXAM: PORTABLE CHEST 1 VIEW COMPARISON:  04/06/2023. FINDINGS: There are new heterogeneous opacities overlying the right lower lung zone, concerning for pneumonia. There is also new small layering right pleural effusion. Redemonstration of approximately 3 cm sized irregular nodule overlying the left upper mid lung zone region, unchanged. There is left retrocardiac opacity, likely due to combination of left lung atelectasis and/or consolidation with small layering pleural effusion, slightly decreased since the prior study. Stable cardio-mediastinal silhouette. No acute osseous abnormalities. The soft tissues are within normal limits. IMPRESSION: *New heterogeneous opacities overlying the right lower lung zone, concerning for pneumonia. There is also new small layering right pleural effusion. *Persistent left retrocardiac opacity and slightly reduced left pleural effusion. Electronically Signed   By: Ree Molt M.D.   On: 04/08/2023 14:48   IR LUMBAR PUNCTURE Result Date: 04/08/2023 CLINICAL DATA:  83 year old male with history of 1 cancer presenting with altered mental status and seizures of unclear etiology. Fluoroscopy guided lumbar puncture requested. EXAM: DIAGNOSTIC LUMBAR PUNCTURE UNDER FLUOROSCOPIC GUIDANCE COMPARISON:  None Available. FLUOROSCOPY: Radiation Exposure Index (as provided by the fluoroscopic device): Peak skin dose 213 mGy. PROCEDURE: Informed consent was obtained from the patient prior to the procedure, including potential complications of headache, allergy, and pain. The patient was placed  in a left lateral decubitus in the  angiography table. The lower back was prepped with Betadine and draped. 1% Lidocaine  was used for local anesthesia. Lumbar puncture was performed at the L2-3 level using a 20 gauge spinal needle. Clear CSF return was seen. An opening pressure of 7 cm water was recorded. 12 mL of CSF was obtained for laboratory studies. The patient tolerated the procedure well and there were no apparent complications. The procedure was performed under moderate sedation. A total of 0.5 mg of Versed  and 25 mcg of fentanyl  were administered intravenously for moderate conscious sedation monitored under my direct supervision. Total intraservice time of sedation was time and units of time 24 minutes. The patient's vital signs were monitored throughout the procedure and recorded in the patient's medical record by the nurse. IMPRESSION: Successful and uncomplicated fluoroscopy guided lumbar puncture at the L2-3 level. Electronically Signed   By: Katyucia  de Macedo Rodrigues M.D.   On: 04/08/2023 13:39   ECHOCARDIOGRAM LIMITED Result Date: 04/08/2023    ECHOCARDIOGRAM LIMITED REPORT   Patient Name:   BENNY DEUTSCHMAN Date of Exam: 04/08/2023 Medical Rec #:  [id number]        Height:       66.0 in Accession #:    [id number]       Weight:       149.0 lb Date of Birth:  [birth year]        BSA:          1.765 m Patient Age:    82 years         BP:           140/66 mmHg Patient Gender: M                HR:           66 bpm. Exam Location:  Inpatient Procedure: Limited Echo, Color Doppler and Cardiac Doppler Indications:    Afib I48.91                 Eval TAVR, LVEF  History:        Patient has prior history of Echocardiogram examinations, most                 recent 11/18/2022.                 Aortic Valve: 26 mm Sapien prosthetic, stented (TAVR) valve is                 present in the aortic position.  Sonographer:    Tinnie Gosling RDCS Referring Phys: [id number] SUNIT TOLIA IMPRESSIONS  1. Limited echo for LVEF an TAVR evaluation  2. Left  ventricular ejection fraction, by estimation, is 45 to 50%. Left ventricular ejection fraction by 2D MOD biplane is 45.1 %. The left ventricle has mildly decreased function. The left ventricle demonstrates global hypokinesis. There is mild left ventricular hypertrophy.  3. The mitral valve is abnormal. Mild mitral valve regurgitation.  4. Valve was not completely interrogated- however, there does not apperar to be an increased gradient. The aortic valve has been repaired/replaced. There is a 26 mm Sapien prosthetic (TAVR) valve present in the aortic position. Aortic valve area, by VTI  measures 2.15 cm. Aortic valve mean gradient measures 5.0 mmHg. Aortic valve Vmax measures 1.67 m/s. Comparison(s): No significant change from prior study. 11/18/2022: LVEF 45-50%, 26 mm TAVR with MG 6 mmHg. FINDINGS  Left Ventricle: Left ventricular ejection fraction, by estimation, is  45 to 50%. Left ventricular ejection fraction by 2D MOD biplane is 45.1 %. The left ventricle has mildly decreased function. The left ventricle demonstrates global hypokinesis. There is mild left ventricular hypertrophy. Mitral Valve: The mitral valve is abnormal. Mild to moderate mitral annular calcification. Mild mitral valve regurgitation. Aortic Valve: Valve was not completely interrogated- however, there does not apperar to be an increased gradient. The aortic valve has been repaired/replaced. Aortic valve mean gradient measures 5.0 mmHg. Aortic valve peak gradient measures 11.2 mmHg. Aortic valve area, by VTI measures 2.15 cm. There is a 26 mm Sapien prosthetic, stented (TAVR) valve present in the aortic position. Aorta: The aortic root and ascending aorta are structurally normal, with no evidence of dilitation. LEFT VENTRICLE PLAX 2D                        Biplane EF (MOD) LVIDd:         5.30 cm         LV Biplane EF:   Left LVIDs:         4.10 cm                          ventricular LV PW:         1.20 cm                          ejection LV  IVS:        1.10 cm                          fraction by LVOT diam:     2.30 cm                          2D MOD LV SV:         74                               biplane is LV SV Index:   42                               45.1 %. LVOT Area:     4.15 cm  LV Volumes (MOD) LV vol d, MOD    144.3 ml A2C: LV vol d, MOD    139.9 ml A4C: LV vol s, MOD    80.9 ml A2C: LV vol s, MOD    69.9 ml A4C: LV SV MOD A2C:   63.4 ml LV SV MOD A4C:   139.9 ml LV SV MOD BP:    65.1 ml AORTIC VALVE AV Area (Vmax):    1.72 cm AV Area (Vmean):   1.82 cm AV Area (VTI):     2.15 cm AV Vmax:           167.00 cm/s AV Vmean:          107.000 cm/s AV VTI:            0.346 m AV Peak Grad:      11.2 mmHg AV Mean Grad:      5.0 mmHg LVOT Vmax:         69.20 cm/s LVOT Vmean:        46.900 cm/s LVOT VTI:  0.179 m LVOT/AV VTI ratio: 0.52  SHUNTS Systemic VTI:  0.18 m Systemic Diam: 2.30 cm Vinie Maxcy MD Electronically signed by Vinie Maxcy MD Signature Date/Time: 04/08/2023/11:51:38 AM    Final    Overnight EEG with video Result Date: 04/07/2023 Shelton Arlin KIDD, MD     04/08/2023 10:30 AM Patient Name: NEHAL SHIVES MRN: [id number] Epilepsy Attending: Arlin KIDD Shelton Referring Physician/Provider: Voncile Isles, MD Duration: 04/06/2023 2041 to 04/07/2023 2041  Patient history: 83 year old male with stage II lung adenocarcinoma plans for radiation who presented with altered mental status and proceeded to have focal seizures which then generalized. EEG to evaluate for seizure  Level of alertness: Awake, asleep  AEDs during EEG study: LEV  Technical aspects: This EEG study was done with scalp electrodes positioned according to the 10-20 International system of electrode placement. Electrical activity was reviewed with band pass filter of 1-70Hz , sensitivity of 7 uV/mm, display speed of 62mm/sec with a 60Hz  notched filter applied as appropriate. EEG data were recorded continuously and digitally stored.  Video monitoring was available and  reviewed as appropriate.  Description: No clear posterior dominant rhythm was seen. Sleep was characterized by sleep spindles (12 to 14 Hz), maximal frontocentral region. EEG showed 3-5hz  theta-delta slowing with overriding 13 to 15 Hz beta activity. Sharp waves were noted in the hemisphere, maximal left frontal temporal region which at times appeared periodic at 1 Hz.  Gradually the sharp waves evolved into generalized periodic discharges with triphasic morphology at 1 Hz.  After around 10 AM, patient was noted to have generalized periodic discharges (GPD) with triphasic morphology with fluctuating frequency.  Once every few hours, EEG showed Gpds at 2-2.5hz  with overlying rhythmicity and fast activity lasting 1 to 2 minutes without definite evolution.  This EEG pattern then became more frequent and was noted 3-6 times every hour.  No clinical signs were seen during this EEG pattern. Hyperventilation and photic stimulation were not performed.    ABNORMALITY - Sharp waves, left hemisphere, maximal left frontotemporal region -Periodic discharges with triphasic morphology, generalized - Continuous slow, generalized and lateralized left hemisphere  IMPRESSION: This study initially showed evidence of epileptogenicity and cortical dysfunction arising from left hemisphere, maximal in frontotemporal region. Additionally there is moderate diffuse encephalopathy.  No definite seizures were seen during this time. Gradually, EEG worsened and showed generalized periodic discharges with triphasic morphology and fluctuating frequency of 1 to 2.5 Hz with overlying rhythmicity lasting 1 to 2 minutes.  This EEG pattern is on the ictal -interictal continuum with increased risk of seizures.  Arlin KIDD Shelton   EEG adult Result Date: 04/06/2023 Shelton Arlin KIDD, MD     04/06/2023  2:24 PM Patient Name: EBB CARELOCK MRN: [id number] Epilepsy Attending: Arlin KIDD Shelton Referring Physician/Provider: Keturah Carrier, MD Date: 04/06/2023  Duration: 22.10 mins Patient history: 83 year old male with stage II lung adenocarcinoma plans for radiation who presented with altered mental status and proceeded to have focal seizures which then generalized. EEG to evaluate for seizure Level of alertness: Awake, asleep AEDs during EEG study: LEV, Ativan  Technical aspects: This EEG study was done with scalp electrodes positioned according to the 10-20 International system of electrode placement. Electrical activity was reviewed with band pass filter of 1-70Hz , sensitivity of 7 uV/mm, display speed of 100mm/sec with a 60Hz  notched filter applied as appropriate. EEG data were recorded continuously and digitally stored.  Video monitoring was available and reviewed as appropriate. Description: No clear posterior dominant rhythm was seen. Sleep  was characterized by sleep spindles (12 to 14 Hz), maximal frontocentral region. EEG showed 3-5hz  theta-delta slowing with overriding 13 to 15 Hz beta activity. Sharp waves were noted in the hemisphere, maximal left frontal temporal region, at times quasi periodic at 1 Hz.  Hyperventilation and photic stimulation were not performed.   ABNORMALITY - Sharp waves, left hemisphere, maximal left frontotemporal region - Continuous slow, generalized and lateralized left hemisphere IMPRESSION: This study showed evidence of epileptogenicity and cortical dysfunction arising from left hemisphere, maximal in frontotemporal region.  Additionally there is moderate diffuse encephalopathy.  No definite seizures were seen during this time. Recommend long-term EEG for further evaluation. Priyanka O Yadav   MR BRAIN W WO CONTRAST Result Date: 04/06/2023 CLINICAL DATA:  New onset seizure. History of rectal cancer. History of lung cancer. EXAM: MRI HEAD WITHOUT AND WITH CONTRAST TECHNIQUE: Multiplanar, multiecho pulse sequences of the brain and surrounding structures were obtained without and with intravenous contrast. CONTRAST:  7mL GADAVIST   GADOBUTROL  1 MMOL/ML IV SOLN COMPARISON:  Head CT 04/05/2023.  MRI 03/16/2023. FINDINGS: Brain: Diffusion imaging does not show any acute or subacute infarction or other cause of restricted diffusion. Chronic small-vessel ischemic changes affect the pons. No focal cerebellar insult. Cerebral hemispheres show advanced chronic small-vessel ischemic changes of the deep and subcortical white matter. Chronic susceptibility artifact in the right occipital lobe previously felt secondary to a small cavernoma and developmental venous anomaly. No change in this appearance. There is no evidence of neoplastic mass lesion, hemorrhage, hydrocephalus or extra-axial collection. No abnormal brain or leptomeningeal enhancement occurs. Vascular: Major vessels at the base of the brain show flow. Skull and upper cervical spine: Negative Sinuses/Orbits: Clear/normal Other: None IMPRESSION: 1. No acute or reversible finding. No evidence of metastatic disease. 2. Advanced chronic small-vessel ischemic changes of the pons and cerebral hemispheric white matter. 3. Chronic susceptibility artifact in the right occipital lobe previously felt secondary to a small cavernoma and developmental venous anomaly. No change in this appearance. Electronically Signed   By: Oneil Officer M.D.   On: 04/06/2023 09:40   DG CHEST PORT 1 VIEW Result Date: 04/06/2023 CLINICAL DATA:  Lung cancer patient presents with hypoxia. [id number]. EXAM: PORTABLE CHEST 1 VIEW COMPARISON:  Portable chest yesterday at 8:02 p.m. FINDINGS: Interval increased patchy consolidation in the left lower lung field concerning for pneumonia or aspiration. Emphysematous changes and biapical scarring with nearly 5 cm left upper lobe mass are again noted. Remaining lungs are clear. The cardiac size is normal. A TAVR is again noted with aortic tortuosity and calcifications, stable mediastinum. There is suspected a small left pleural effusion as well. No new osseous findings. IMPRESSION: 1.  Interval increased patchy consolidation in the left lower lung field concerning for pneumonia or aspiration. 2. Suspected small left pleural effusion. 3. Emphysematous changes and biapical scarring with nearly 5 cm known left upper lobe mass. Electronically Signed   By: Francis Quam M.D.   On: 04/06/2023 05:10   DG Chest Port 1 View Result Date: 04/05/2023 CLINICAL DATA:  Chest pain EXAM: PORTABLE CHEST 1 VIEW COMPARISON:  03/06/2023 FINDINGS: Again seen is the left upper lobe mass compatible with known primary lung cancer. Patchy opacities in the right upper lobe are unchanged. This likely reflects scarring as seen on prior chest CT. No acute confluent opacities or effusions. Heart is normal size. No acute bony abnormality. IMPRESSION: Left upper lobe mass again noted as seen on prior imaging compatible with known lung cancer. No acute cardiopulmonary  disease. Electronically Signed   By: Franky Crease M.D.   On: 04/05/2023 20:21   CT Head Wo Contrast Result Date: 04/05/2023 CLINICAL DATA:  Headache EXAM: CT HEAD WITHOUT CONTRAST TECHNIQUE: Contiguous axial images were obtained from the base of the skull through the vertex without intravenous contrast. RADIATION DOSE REDUCTION: This exam was performed according to the departmental dose-optimization program which includes automated exposure control, adjustment of the mA and/or kV according to patient size and/or use of iterative reconstruction technique. COMPARISON:  Head CT 01/18/2023.  MRI brain 01/14/2023. FINDINGS: Brain: No evidence of acute infarction, hemorrhage, hydrocephalus, extra-axial collection or mass lesion/mass effect. Again seen is mild diffuse atrophy. There is extensive periventricular and deep white matter hypodensity which is similar to the prior examination. Vascular: Atherosclerotic calcifications are present within the cavernous internal carotid arteries. Skull: Normal. Negative for fracture or focal lesion. Sinuses/Orbits: No acute  finding. Other: None. IMPRESSION: 1. No acute intracranial process. 2. Stable atrophy and chronic small vessel ischemic changes. Electronically Signed   By: Greig Pique M.D.   On: 04/05/2023 20:14   MR Brain W Wo Contrast Result Date: 04/03/2023 CLINICAL DATA:  Provided history: Adenocarcinoma of left lung. Non-small cell lung cancer, staging. Metastatic disease evaluation. EXAM: MRI HEAD WITHOUT AND WITH CONTRAST TECHNIQUE: Multiplanar, multiecho pulse sequences of the brain and surrounding structures were obtained without and with intravenous contrast. CONTRAST:  7mL GADAVIST  GADOBUTROL  1 MMOL/ML IV SOLN COMPARISON:  Head CT 01/18/2023. FINDINGS: Brain: Mild generalized parenchymal atrophy. Multifocal T2 FLAIR hyperintense signal abnormality within the cerebral white matter, nonspecific but compatible with advanced chronic small vessel ischemic disease. To a lesser degree, chronic small vessel ischemic changes are also present within the pons. 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 Punctate chronic microhemorrhage within the right parietal lobe. No evidence of an intracranial mass or intracranial metastatic disease. No cortical encephalomalacia is identified. There is no acute infarct. No extra-axial fluid collection. No midline shift. Vascular: Maintained flow voids within the proximal large arterial vessels. Developmental venous anomaly as described above. Skull and upper cervical spine: No focal worrisome marrow lesion. Incompletely assessed cervical spondylosis. Ligamentous hypertrophy posterior to the dens. Sinuses/Orbits: No mass or acute finding within the imaged orbits. Prior bilateral ocular lens replacement. Minimal mucosal thickening within the bilateral frontal and ethmoid sinuses. IMPRESSION: 1. No evidence of intracranial metastatic disease. 2. Advanced chronic small vessel ischemic changes within the cerebral white matter, and to a  lesser degree within the pons. 3. Subcentimeter cavernoma in the right occipital lobe (with an adjacent developmental venous anomaly). 4. Mild generalized parenchymal atrophy. 5. Minor frontal and ethmoid sinus mucosal thickening bilaterally. Electronically Signed   By: Rockey Childs D.O.   On: 04/03/2023 11:03    Microbiology: Recent Results (from the past 240 hours)  Resp panel by RT-PCR (RSV, Flu A&B, Covid) Anterior Nasal Swab     Status: None   Collection Time: 04/05/23  7:51 PM   Specimen: Anterior Nasal Swab  Result Value Ref Range Status   SARS Coronavirus 2 by RT PCR NEGATIVE NEGATIVE Final    Comment: (NOTE) SARS-CoV-2 target nucleic acids are NOT DETECTED.  The SARS-CoV-2 RNA is generally detectable in upper respiratory specimens during the acute phase of infection. The lowest concentration of SARS-CoV-2 viral copies this assay can detect is 138 copies/mL. A negative result does not preclude SARS-Cov-2 infection and should not be used as the sole basis for treatment or other patient management  decisions. A negative result may occur with  improper specimen collection/handling, submission of specimen other than nasopharyngeal swab, presence of viral mutation(s) within the areas targeted by this assay, and inadequate number of viral copies(<138 copies/mL). A negative result must be combined with clinical observations, patient history, and epidemiological information. The expected result is Negative.  Fact Sheet for Patients:  bloggercourse.com  Fact Sheet for Healthcare Providers:  seriousbroker.it  This test is no t yet approved or cleared by the [place]  FDA and  has been authorized for detection and/or diagnosis of SARS-CoV-2 by FDA under an Emergency Use Authorization (EUA). This EUA will remain  in effect (meaning this test can be used) for the duration of the COVID-19 declaration under Section 564(b)(1) of the Act,  21 U.S.C.section 360bbb-3(b)(1), unless the authorization is terminated  or revoked sooner.       Influenza A by PCR NEGATIVE NEGATIVE Final   Influenza B by PCR NEGATIVE NEGATIVE Final    Comment: (NOTE) The Xpert Xpress SARS-CoV-2/FLU/RSV plus assay is intended as an aid in the diagnosis of influenza from Nasopharyngeal swab specimens and should not be used as a sole basis for treatment. Nasal washings and aspirates are unacceptable for Xpert Xpress SARS-CoV-2/FLU/RSV testing.  Fact Sheet for Patients: bloggercourse.com  Fact Sheet for Healthcare Providers: seriousbroker.it  This test is not yet approved or cleared by the [place]  FDA and has been authorized for detection and/or diagnosis of SARS-CoV-2 by FDA under an Emergency Use Authorization (EUA). This EUA will remain in effect (meaning this test can be used) for the duration of the COVID-19 declaration under Section 564(b)(1) of the Act, 21 U.S.C. section 360bbb-3(b)(1), unless the authorization is terminated or revoked.     Resp Syncytial Virus by PCR NEGATIVE NEGATIVE Final    Comment: (NOTE) Fact Sheet for Patients: bloggercourse.com  Fact Sheet for Healthcare Providers: seriousbroker.it  This test is not yet approved or cleared by the [place]  FDA and has been authorized for detection and/or diagnosis of SARS-CoV-2 by FDA under an Emergency Use Authorization (EUA). This EUA will remain in effect (meaning this test can be used) for the duration of the COVID-19 declaration under Section 564(b)(1) of the Act, 21 U.S.C. section 360bbb-3(b)(1), unless the authorization is terminated or revoked.  Performed at [facility], [address] [place], [place] [address]   CSF culture w Gram Stain     Status: None   Collection Time: 04/07/23  1:25 PM   Specimen: CSF; Cerebrospinal Fluid  Result Value Ref Range  Status   Specimen Description CSF  Final   Special Requests NONE  Final   Gram Stain   Final    WBC PRESENT,BOTH PMN AND MONONUCLEAR NO ORGANISMS SEEN CYTOSPIN SMEAR    Culture   Final    NO GROWTH 3 DAYS Performed at [facility], [address], [place], [place] [address]    Report Status 04/19/23 FINAL  Final    Time spent: 15 minutes  Signed: Trenda Mar, MD 04-19-2023

## 2023-05-01 NOTE — Progress Notes (Signed)
 Having difficulty maintaining his sats.  Resp distress.  MD and RT at side and having discussion about possibly changing to Comfort care after family discussion,  it was decided that he will be comfort care so IVF stopped and many meds and treatments stopped.  Does open eyes and answers simple questions.  [place] he is ready to go to heaven.

## 2023-05-01 NOTE — Progress Notes (Signed)
 PROGRESS NOTE   Curtis Campos  FMW:[id number]    DOB: July 02, 1940    DOA: 04/05/2023  PCP: [name], MD   I have briefly reviewed patients previous medical records in [facility].  Chief Complaint  Patient presents with   Altered Mental Status    Brief Hospital Course:   83 y.o. male with hx of stage II lung adenocarcinoma currently planned for upcoming radiation therapy, remote history of rectal cancer status post colostomy, aortic stenosis status post bioprosthetic TAVR, coronary artery disease, chronic systolic heart failure, hypertension, hyperlipidemia, COPD, chronic hypoxic respiratory failure on 4 L O2 at baseline, HLD (with statin intolerance), paroxysmal atrial fibrillation and chronic hyponatremia, who was brought in from home initially to [facility] ED due to altered mental status followed by what appeared to be possible seizure-like episode.  Family noted that patient was confused, was not making sense, unable to answer questions and while transporting him to ED, noted that he stiffened up.  In the ED he had witnessed generalized tonic-clonic seizures aborted with Ativan  2 mg.  Evaluated by neurology for seizures and Cardiology for A-fib with RVR and transferred to [facility] for LTM EEG and further management.  Neurology adjusting AEDs, on LTM EEG, underwent fluoroscopic guided LP 1/9 to rule out carcinomatous leptomeningitis-lab work pending.  Infectious meningitis ruled out.  Course complicated by aspiration pneumonia, acute on chronic diastolic CHF, acute on chronic hypoxic and hypercapnic respiratory failure needing BiPAP.  PCCM consulted 1/10  05/11/2023: Progressively worsened overnight with worsening hypoxia, lethargy and mental status changes.  Seen patient at bedside along with rapid response RN, RT and PCCM.  Per PCCM, transfer to ICU and they will attempt to reach HCPOA to determine aggressiveness of care.  Addendum: Dr. Theophilus discussed with HCPOA who have opted to transition to  comfort care.  He will place orders and consult palliative care medicine.  Patient will remain on 3 W.   Assessment & Plan:  Principal Problem:   Seizure (HCC) Active Problems:   Witnessed seizure-like activity (HCC)   Atrial fibrillation with RVR (HCC)   Persistent atrial fibrillation (HCC)   Altered mental status   History of transcatheter aortic valve replacement (TAVR)   Chronic heart failure with mildly reduced ejection fraction (HFmrEF, 41-49%) (HCC)   Atherosclerosis of native coronary artery of native heart without angina pectoris   History of cardioversion   Current use of long term anticoagulation   New onset epilepsy  Presented with altered mental status, focal seizures followed by generalized seizures Neurology consulting.  Differentials include neoplastic versus paraneoplastic disease. MRI brain without acute findings and specifically no evidence of metastatic disease. Has been on LTM EEG since admission to [facility].  EEG continued to be abnormal over several days.  Per LTM EEG report 1/11, EEG continues to be improving compared to previous day. Neurology actively managing AEDs.  Several adjustments have been made since admission.  Currently on Vimpat  100 Mg twice daily, valproate 500 Mg IV every 8 hours.  Getting a loading dose of IV valproate 1 g on 1/11 (valproate level low, 28). Given underlying stage II lung adenocarcinoma, underwent LP by IR.  Bacterial meningitis ruled out (CSF meningitis/encephalitis panel negative, CSF culture negative to date).  Empirically started antibiotics for meningitis. As needed IV Ativan  for seizures Seizure precautions and patient needs to be advised regarding no driving and other limitations, at time of discharge. Currently on IVIG x 5 days empirically for paraneoplastic disease.  CSF paraneoplastic autoantibody  profile: Negative Remains quite confused and may be hallucinating. Due to acute respiratory failure, unsafe oral intake, poor gag per  RT, n.p.o., changed all AEDs to IV.  Aspiration pneumonia Chest x-ray 1/9 reviewed, aspiration pneumonia.  Repeat chest x-ray 1/10 suggestive of pulmonary edema more than pneumonia.  I personally reviewed chest x-ray and agree with findings. Continue IV Unasyn , day 4/7.  Aspiration precautions. Procalcitonin 0.57 (was negative on admission) May be having ongoing aspirations due to AMS.  Acute metabolic encephalopathy Likely multifactorial related to ongoing seizure-like activity, multiple AEDs, acute respiratory failure with hypoxia and hypercarbia, infectious etiology such as aspiration pneumonia and hospital delirium.  Ruled out bacterial meningitis. UA negative. B12: 668.  TSH normal Avoid sedatives and opioids.  Delirium precautions. Has not really made sustained improvement over the last several days.  Paroxysmal atrial fibrillation with RVR At [facility] ED, noted to be RVR with diffuse ST segment depressions. Cardiology consultation and follow-up appreciated. IV Cardizem  x 1, briefly on Cardizem  drip, now stopped, due to pulmonary issues.  Not on AC PTA, now on IV heparin .  Need to consider timing of switch to DOAC given tenuous overall medical condition. Reverted and remains in NSR Since n.p.o., Toprol -XL 50 Mg daily changed to IV metoprolol  5 Mg every 6 hours.  Acute on chronic respiratory failure with hypoxia and hypercarbia On home oxygen  4 L/min via [place] at baseline Influenza, RSV and SARS coronavirus 2 by RT-PCR negative. Was weaned down from HFNC 9-10 L/min oxygen  to 4 L/min yesterday.  However overnight on 1/9, progressive hypoxia lethargy, started and remains on a BiPAP. ABG 1/9: pH 7.31, pCO2 57, pO2 111.  VBG 1/10: pH 7.41, pCO2 41, pO2 65. Acute respiratory failure due to aspiration pneumonia and acute on chronic combined CHF, complicating acute metabolic encephalopathy. Chronic respiratory failure due to underlying advanced COPD, lung scarring and lung mass. Treat underlying  etiology.  Monitor closely. CCM on board. Patient did not tolerate BiPAP and is on Ventimask 14 L/min.  Despite Ventimask, overnight progressive hypoxia.  ABG showed pH of 7.3, pCO2 62, pO2 85. Remains full code.  Requires intensive monitoring and management.  Needs intubation and mechanical ventilation unless family opted to transition to DNR/DNI and comfort oriented care, PCCM will have this discussion.  HCPOA this morning.  Acute on chronic combined systolic and diastolic CHF TTE 1/75 with LVEF 45-50% and grade 1 diastolic dysfunction.  Repeat echo 1/9 with similar EF. On GDMT with Toprol -XL 50 Mg daily, Entresto  and torsemide  20 Mg daily.  Resume Farxiga  at discharge. Cardiology following. Follow-up chest x-ray 1/10 and 1/11 shows pulmonary edema >pneumonia.  Apart from some upper extremity edema, does not appear volume overloaded, no JVD. IV Lasix  40 mg x 1 on 1/10 and 1/11.  On torsemide  20 Mg daily-discontinued due to n.p.o. Chest x-ray personally reviewed: Left lung with pulmonary edema versus infiltrate findings.  Right lung relatively to be clear  CAD No anginal symptoms. Currently on aspirin  81 Mg daily-Per cardiology to be discontinued once he started on DOAC. Resume Praluent  and Zetia  at discharge. Aspirin  changed rectal.   COPD No clinical bronchospasm. Continue Dulera  and as needed bronchodilator nebs.   Mixed hyperlipidemia Intolerant to statins continue outpatient Praluent    Hypokalemia Replaced IV.  Magnesium  normal.  Hyponatremia May be multifactorial, now may be somewhat volume overloaded.?  SIADH.   Also suspect some of this is chronic given that he is on salt tablets at home. Completed brief and gentle IV normal saline hydration.  Serum sodium up to 133.  Getting a dose of IV Lasix .  Follow BMP in AM.   Stage II adenocarcinoma of lung Continue outpatient follow-up with oncology service.   Leukocytosis WBC had peaked to 23.9. Now treating for aspiration  pneumonia.  Bacterial meningitis ruled out.  On IV Unasyn .  Improving, down to 14.2.  Anemia of chronic disease Stable.  Severe aortic stenosis s/p TAVR 8/23 Outpatient follow-up with cardiology.  Adult failure to thrive: Multifactorial due to very advanced age and multiple severe chronic and acute rectal problems as listed above.  Body mass index is 29.43 kg/m.   DVT prophylaxis: SCDs Start: 04/06/23 0307     Code Status: Full Code:  Family Communication: No family at bedside.  I had discussed extensively with patient's son/HCPOA and patient's daughter-in-law at bedside on 1/10, updated critical nature of his illness, advised them to consider regarding his resuscitation status.  They were leaning towards changing to DNR/DNI but continuing rest of medical management.  However they have not confirmed this yet. Disposition:  Status is: Inpatient Remains inpatient appropriate because: Remains critically ill, on IV antibiotics, LTM EEG, titrating AEDs.     Consultants:   Neurology Cardiology IR PCCM  Procedures:   LTM EEG Fluoroscopic guided LP by IR on 1/9. BiPAP  Antimicrobials:      Subjective:  Overnight events noted.  Persistent hypoxia down to 70s on VM.  Patient remains confused, lethargic awake.  Objective:   Vitals:   2023-04-27 0327 04-27-23 0615 April 27, 2023 0718 2023/04/27 0724  BP:    (!) 152/70  Pulse:  95 (!) 101 99  Resp:  11 (!) 26 16  Temp: 98.7 F (37.1 C)   99.1 F (37.3 C)  TempSrc: Axillary   Axillary  SpO2: 90% 91% 92% 90%  Weight:      Height:        General exam: Elderly male, moderately built and nourished, ill looking, sitting up in bed with NRBM mildly tachypneic. Respiratory system: Diminished breath sounds bilaterally, harsh breath sounds.  No wheezing or rhonchi.  Few crackles mostly on the left side. Cardiovascular system: S1 & S2 heard, RRR.  No JVD, murmurs, rubs, gallops or clicks. No pedal edema.  Telemetry personally reviewed:  Remains in sinus rhythm.  Does have mild bilateral forearm edema with bruising. Gastrointestinal system: Abdomen is nondistended, soft and nontender. No organomegaly or masses felt. Normal bowel sounds heard. Central nervous system: Mentation waxing and waning since hospital admission.  Lethargic but awake.  Oriented to self.  Confused.  No focal neurological deficits. Extremities: Symmetric 5 x 5 power. Skin: No rashes, lesions or ulcers Psychiatry: Judgement and insight impaired. Mood & affect cannot be assessed.     Data Reviewed:   I have personally reviewed following labs and imaging studies   CBC: Recent Labs  Lab 04/09/23 0701 04/10/23 0627 04-27-23 0446  WBC 17.0* 14.2* 14.0*  HGB 8.6* 8.3* 8.4*  HCT 26.5* 25.2* 26.2*  MCV 83.3 82.6 83.4  PLT 576* 537* 534*    Basic Metabolic Panel: Recent Labs  Lab 04/05/23 1902 04/06/23 0448 04/07/23 0536 04/08/23 0715 04/09/23 0701 04/10/23 0627 April 27, 2023 0445 04-27-2023 0446  NA 131* 133* 133* 128* 129* 129*  --  133*  K 3.4* 3.3* 3.9 3.6 3.9 3.5  --  3.3*  CL 93* 95* 90* 91* 93* 94*  --  94*  CO2 27 26 24 27 25 26   --  28  GLUCOSE 166* 114* 94 112* 88  99  --  87  BUN 14 15 18 12 12  26*  --  27*  CREATININE 0.77 0.75 0.74 0.61 0.71 0.91  --  0.84  CALCIUM 8.7* 8.7* 8.6* 8.4* 8.4* 8.0*  --  8.2*  MG 2.1 2.2 2.0  --   --   --  2.1  --   PHOS  --  3.3  --   --   --   --   --   --     Liver Function Tests: Recent Labs  Lab 04/05/23 1902  AST 13*  ALT 12  ALKPHOS 127*  BILITOT 0.6  PROT 7.5  ALBUMIN 2.6*    CBG: No results for input(s): GLUCAP in the last 168 hours.  Microbiology Studies:   Recent Results (from the past 240 hours)  Resp panel by RT-PCR (RSV, Flu A&B, Covid) Anterior Nasal Swab     Status: None   Collection Time: 04/05/23  7:51 PM   Specimen: Anterior Nasal Swab  Result Value Ref Range Status   SARS Coronavirus 2 by RT PCR NEGATIVE NEGATIVE Final    Comment: (NOTE) SARS-CoV-2 target  nucleic acids are NOT DETECTED.  The SARS-CoV-2 RNA is generally detectable in upper respiratory specimens during the acute phase of infection. The lowest concentration of SARS-CoV-2 viral copies this assay can detect is 138 copies/mL. A negative result does not preclude SARS-Cov-2 infection and should not be used as the sole basis for treatment or other patient management decisions. A negative result may occur with  improper specimen collection/handling, submission of specimen other than nasopharyngeal swab, presence of viral mutation(s) within the areas targeted by this assay, and inadequate number of viral copies(<138 copies/mL). A negative result must be combined with clinical observations, patient history, and epidemiological information. The expected result is Negative.  Fact Sheet for Patients:  bloggercourse.com  Fact Sheet for Healthcare Providers:  seriousbroker.it  This test is no t yet approved or cleared by the [place]  FDA and  has been authorized for detection and/or diagnosis of SARS-CoV-2 by FDA under an Emergency Use Authorization (EUA). This EUA will remain  in effect (meaning this test can be used) for the duration of the COVID-19 declaration under Section 564(b)(1) of the Act, 21 U.S.C.section 360bbb-3(b)(1), unless the authorization is terminated  or revoked sooner.       Influenza A by PCR NEGATIVE NEGATIVE Final   Influenza B by PCR NEGATIVE NEGATIVE Final    Comment: (NOTE) The Xpert Xpress SARS-CoV-2/FLU/RSV plus assay is intended as an aid in the diagnosis of influenza from Nasopharyngeal swab specimens and should not be used as a sole basis for treatment. Nasal washings and aspirates are unacceptable for Xpert Xpress SARS-CoV-2/FLU/RSV testing.  Fact Sheet for Patients: bloggercourse.com  Fact Sheet for Healthcare  Providers: seriousbroker.it  This test is not yet approved or cleared by the [place]  FDA and has been authorized for detection and/or diagnosis of SARS-CoV-2 by FDA under an Emergency Use Authorization (EUA). This EUA will remain in effect (meaning this test can be used) for the duration of the COVID-19 declaration under Section 564(b)(1) of the Act, 21 U.S.C. section 360bbb-3(b)(1), unless the authorization is terminated or revoked.     Resp Syncytial Virus by PCR NEGATIVE NEGATIVE Final    Comment: (NOTE) Fact Sheet for Patients: bloggercourse.com  Fact Sheet for Healthcare Providers: seriousbroker.it  This test is not yet approved or cleared by the [place]  FDA and has been authorized for detection and/or diagnosis of  SARS-CoV-2 by FDA under an Emergency Use Authorization (EUA). This EUA will remain in effect (meaning this test can be used) for the duration of the COVID-19 declaration under Section 564(b)(1) of the Act, 21 U.S.C. section 360bbb-3(b)(1), unless the authorization is terminated or revoked.  Performed at [facility], [address] [place], [place] [address]   CSF culture w Gram Stain     Status: None (Preliminary result)   Collection Time: 04/07/23  1:25 PM   Specimen: CSF; Cerebrospinal Fluid  Result Value Ref Range Status   Specimen Description CSF  Final   Special Requests NONE  Final   Gram Stain   Final    WBC PRESENT,BOTH PMN AND MONONUCLEAR NO ORGANISMS SEEN CYTOSPIN SMEAR    Culture   Final    NO GROWTH 2 DAYS Performed at [facility], [address], [place], [place] [address]    Report Status PENDING  Incomplete    Radiology Studies:  DG Chest Port 1 View Result Date: 04-24-2023 CLINICAL DATA:  Acute respiratory distress. EXAM: PORTABLE CHEST 1 VIEW COMPARISON:  04/10/2023 FINDINGS: Cardiomegaly status post TAVR. Bibasilar collapse/consolidation  again noted with bilateral pleural effusions. Left upper lobe pulmonary masslike opacity is similar to prior. Bones are diffusely demineralized. Telemetry leads overlie the chest. IMPRESSION: 1. No significant interval change. 2. Bibasilar collapse/consolidation with bilateral pleural effusions. 3. Left upper lobe pulmonary masslike opacity is similar to prior. Electronically Signed   By: Camellia Candle M.D.   On: 04/24/23 06:31   DG CHEST PORT 1 VIEW Result Date: 04/10/2023 CLINICAL DATA:  [id number] Acute on chronic respiratory failure with hypoxia [facility]) [id number] EXAM: PORTABLE CHEST 1 VIEW COMPARISON:  04/09/2023 FINDINGS: Left upper lobe mass again noted, unchanged. Diffuse interstitial prominence again noted, most pronounced in the lower lungs. Layering bilateral effusions with bibasilar airspace opacities. Cardiomediastinal contours are stable. No acute bony abnormality. IMPRESSION: Stable interstitial prominence with lower lobe predominance, favor edema. Layering bilateral effusions. Stable left upper lobe mass. Electronically Signed   By: Franky Crease M.D.   On: 04/10/2023 08:40    Scheduled Meds:    aspirin   300 mg Rectal Daily   budesonide  (PULMICORT ) nebulizer solution  0.5 mg Nebulization BID   Chlorhexidine  Gluconate Cloth  6 each Topical Daily   ipratropium-albuterol   3 mL Nebulization Q4H   metoprolol  tartrate  5 mg Intravenous Q6H   sodium chloride  flush  3 mL Intravenous Q12H   thiamine  (VITAMIN B1) injection  100 mg Intravenous Daily    Continuous Infusions:    ampicillin -sulbactam (UNASYN ) IV 3 g (January 10, 2024 0614)   heparin  1,700 Units/hr (04-24-23 0031)   Immune Globulin  10% 240 mL/hr at 04/10/23 1701   lacosamide  (VIMPAT ) IV     potassium chloride      valproate sodium  500 mg (04/10/23 2337)     LOS: 5 days   Patient is critically ill with progressive acute on chronic hypoxic and hypercapnic respiratory failure, decompensated CHF, requiring intensive level overall  evaluation, management as noted above and CCM consultation and updating family Critical care time spent: 60 minutes.  Trenda Mar, MD,  FACP, [facility], [facility], [facility] [place]      To contact the attending provider between 7A-7P or the covering provider during after hours 7P-7A, please log into the web site [email or web address] and access using universal [place] password for that web site. If you do not have the password, please call the hospital operator.  04/24/23, 7:53 AM

## 2023-05-01 NOTE — Plan of Care (Signed)
   Problem: Clinical Measurements: Goal: Will remain free from infection Outcome: Progressing Goal: Diagnostic test results will improve Outcome: Progressing

## 2023-05-01 NOTE — Progress Notes (Signed)
 RT came up to take off the High flow O2.  She came earlier, per palliative NP but family was not ready and asked that she come back later because they wanted some other family members to make it her first.  Family at his side now and are holding his hand and talking to him, telling him how much they love him and that it is ok to go.  They did request that I call RT back now to come and remove the O2.  Pt appears to be resting comfortably.

## 2023-05-01 NOTE — Progress Notes (Signed)
 Son and daughter-in-law at bedside and this nurse explained what has gone on since they had spoken with paliative np.  All questions answered.  Provided support.  Will notify Chaplain that family is at bedside.

## 2023-05-01 NOTE — Progress Notes (Signed)
 His respirations and heart rate ceased at  1445, verified with 2 RN's. Spouse, children and in-laws are at bedside when he took his last breath. Primary Doctor, pharmacy, patient placement and [employer] were notified. Chaplain was notified and came to bedside to be with family. Support was given to family and they were allowed private time to be with him.

## 2023-05-01 NOTE — Significant Event (Addendum)
 Rapid Response Event Note   Reason for Call : Hypoxia sats 75% on NRB mask Initial Focused Assessment:  Called by nursing staff regarding pt with acute hypoxic episode with sats 75% on NRB mask at 15L. Pt is awake, impulsive and confused (baseline). Pt placed back on BIPAP per RRT. PCXR ordered.   0615-98.36F, HR 98, 167/82, RR 26 with sats 88% on BIPAP 15/10 with 85% Fio2   Interventions:  -BIPAP _PCXR       MD Notified: Dr. Shona per primary RN Call Time: 0554 Arrival Time: 0610 End Time: 9379  Griselda Alm ORN, RN

## 2023-05-01 NOTE — Progress Notes (Signed)
 PHARMACY - ANTICOAGULATION CONSULT NOTE  Pharmacy Consult for IV heparin  Indication: atrial fibrillation  Allergies  Allergen Reactions   Codeine Nausea Only   Statins Other (See Comments)    REACTION: myalgias    Patient Measurements: Height: 5' 6 (167.6 cm) Weight: 82.7 kg (182 lb 5.1 oz) IBW/kg (Calculated) : 63.8 Heparin  Dosing Weight: 75.3 kg  Vital Signs: Temp: 99.1 F (37.3 C) May 02, 2023 0724) Temp Source: Axillary 05-02-23 0724) BP: 152/70 05/02/23 0724) Pulse Rate: 99 05-02-23 0724)  Labs: Recent Labs    04/09/23 0701 04/09/23 1659 04/10/23 0627 04/10/23 1652 May 02, 2023 0446  HGB 8.6*  --  8.3*  --  8.4*  HCT 26.5*  --  25.2*  --  26.2*  PLT 576*  --  537*  --  534*  HEPARINUNFRC 0.18*   < > 0.73* >1.10* 0.76*  CREATININE 0.71  --  0.91  --  0.84   < > = values in this interval not displayed.    Estimated Creatinine Clearance: 68.5 mL/min (by C-G formula based on SCr of 0.84 mg/dL).   Medical History: Past Medical History:  Diagnosis Date   Agent orange exposure    Anemia    Anxiety    Aortic atherosclerosis (HCC)    Aortic stenosis 07/04/2013   a.)TTE 07/04/13: mild (MPG 12; AVA 1.6); b.) TTE 01/01/16: mod (MPG 14; AVA 1.17); c.) TTE 08/30/17: mod (MPG 15; AVA 1.25); d.) TTE 07/13/19: mod (MPG 19; AVA 1.16); e.) TTE 07/23/20: mod (MPG 22; AVA 1.25); f.) TTE 03/12/2021: mod-sev (MPH 34; AVA 0.75); g.) TTE 05/21/2021: mod (MPG 22; AVA 0.8); h.) RHC 08/27/2020: sev (MPG 26.18; AVA 0.69); i.) s/p TAVR 11/25/21: 26 mm Edwards S3 Ultra Resilia   Arthritis 03/12/2011   Cardiomegaly    Cerebral microvascular disease    Cholelithiasis    Chronic hyponatremia    Chronic obstructive lung disease (HCC)    Colostomy in place [facility])    a.) s/p resection secondary to rectal cancer   Coronary artery disease 03/2013   a.) LHC 04/10/2013: 10% mLAD, 50% D1, 30% D2, 99% mLCx, 50% OM1, 40% pRCA, 30% mRCA, 60% RPDA, 40% RPLS - med mgmt; b.) MV 08/31/2019: basal inflat/mid inflat  defect c/w ischemia in LCx distribution. EF 37%; c.) R/LHC 08/27/2020: 50% D1, 50% D2, 90% mLCx, 40% p-mRCA, 80% RPDA - med mgmt   Former smoker    HFrEF (heart failure with reduced ejection fraction) (HCC)    a.) TEE 04/13/13: EF 20-25, glob HK, G1DD, mild LAE, mod-sev MR; b.) TTE 08/30/17: EF 45-50, LVH, mild LAE, G1DD, mod AS, mod MR; c.) TTE 07/13/19: EF 30-35, glob HK, G1DD, RVSP 49.4, mild LAE, mild-mod MR, mod AS; d.) TTE 03/12/21: EF 55, G2DD, mild MR, mod-sev AS; e.) TTE 12/24/21: EF 50, mild LVH, G1DD, RVSP 40.5, mild-mod MR, triv AR; f.) TTE 11/18/22: EF 45-50, glob HK, G1DD, mild MR   History of bilateral cataract extraction    Hyperlipidemia    Hypertension    Hypokalemia    Ischemic cardiomyopathy    a.) TTE 04/08/2014: EF 25-30%; b.) TEE 04/13/2013: EF 20-25%; c.) TTE 07/04/2013: EF 30-35%; d.) TTE 01/01/2016: EF 40-45%; e.) TTE 08/30/2017: EF 45-50%; f.) TTE 07/03/2019: EF 30-35%; g.) MV 08/31/2019: EF 37%; h.) TTE 07/23/20: EF 55-60%; i.) TTE 03/12/2021: EF 55%; j.) TTE 05/21/2021: EF 40-45%; k.) TTE 11/26/2021: EF 45-50%; l.) TTE 12/24/2021: EF 50%; m.) TTE 11/18/2022: EF 45-50%   Long term (current) use of aspirin   Mass of upper lobe of left lung 03/10/2021   a.) CTA chest 03/10/2021: 6.1 mm; b.) CTA chest 10/15/2021: 8 mm; c.) PET CT 11/14/2021: 8 mm (SUV max 2.8); d.) CXR 10/05/2022: interval increase to 2.7 cm; e.) CXR 01/18/2023: interval increase to 3.7 cm; f.) CT chest 01/22/2023: 3.8 x 3.2 cm; g.) PET CT 02/01/2023: 3.8 cm and markedly hypermetabolic with SUV max of 43   Multifocal atrial tachycardia (HCC)    Myocardial infarction (HCC)    a.) suspected by cardiology during 03/2013 admission for CP/CAP/new onset CHF --> TTE  showed severe LV dysfunction with segmental wall motion abnormalities and severe MR (posteriorly-directed  jet)   Pneumonia    Pre-diabetes    Pulmonary hypertension (HCC)    a.) TTE 04/08/2013: RVSP 62.2; b.) TTE 07/13/2019: RVSP 49.4; c.) TTE  07/23/2020: RVSP 33.2; d.) TTE 11/26/2021: RVSP 41.2; e.) TTE 12/24/2021: RVSP 40.5; f.) TTE 11/18/2019: RVSP 29.8   Rectal cancer (HCC)    Requires continuous at home supplemental oxygen     a.) 4L/[place]   S/P TAVR (transcatheter aortic valve replacement) 11/25/2021   a.) 26mm Edwards Sapien 3 Ultra Resilia via the TF approach   Statin intolerance    Umbilical hernia (s/p repair)    Assessment: Curtis Campos is a 83 y.o. year old male admitted on 04/05/2023 with concern for new afib RVR. No anticoagulation prior to admission. Pharmacy consulted to dose heparin . Pt is s/p LP. Ok to resume heparin  per Dr. De Macedo.   Heparin  level 0.76 remains supratherapeutic with heparin  running at 1700 units/hr, despite a 1 hour pause in infusion yesterday for supratherapeutic levels. Hgb (8.4) and PLTs (534) are stable. Per RN, no report of pauses, issues with the line, or signs of bleeding.   Patient is now transitioning to full comfort measures. Heparin  will be stopped shortly.    Goal of Therapy:  Heparin  level 0.3-0.7 units/ml Monitor platelets by anticoagulation protocol: Yes   Plan:  Comfort care   Nidia Schaffer, PharmD PGY1 Pharmacy Resident  Please check AMION for all [facility] phone numbers After 10:00 PM, call Main Pharmacy [phone] 04/18/23 7:32 AM

## 2023-05-01 NOTE — Progress Notes (Signed)
 LTM maint complete - no new skin breakdown seen, healing area on forehead at Fp1 Fp2 Leads serviced. Hair net placed due to pt  , possibly having imaging and being placed on supported air.  Patient do have mittens on [facility] monitored, Event button test confirmed by [facility].

## 2023-05-01 NOTE — Progress Notes (Signed)
 Pt removed from all respiratory support at 1403 per family request and MD comfort care orders. Pt is comfortable with family at bedside.

## 2023-05-01 DEATH — deceased

## 2023-05-03 ENCOUNTER — Ambulatory Visit: Payer: No Typology Code available for payment source

## 2023-05-05 ENCOUNTER — Ambulatory Visit: Payer: No Typology Code available for payment source

## 2023-06-24 ENCOUNTER — Ambulatory Visit: Payer: Medicare Other | Admitting: Medical
